# Patient Record
Sex: Female | Born: 1975 | Race: Black or African American | Hispanic: No | Marital: Married | Smoking: Current every day smoker
Health system: Southern US, Community
[De-identification: ages and names within clinical notes are randomized; demographics above are authoritative.]

## PROBLEM LIST (undated history)

## (undated) ENCOUNTER — Emergency Department (HOSPITAL_BASED_OUTPATIENT_CLINIC_OR_DEPARTMENT_OTHER): Admission: EM | Payer: Medicaid Other | Source: Home / Self Care

## (undated) DIAGNOSIS — M79661 Pain in right lower leg: Secondary | ICD-10-CM

## (undated) DIAGNOSIS — F32A Depression, unspecified: Secondary | ICD-10-CM

## (undated) DIAGNOSIS — B029 Zoster without complications: Secondary | ICD-10-CM

## (undated) DIAGNOSIS — G4730 Sleep apnea, unspecified: Secondary | ICD-10-CM

## (undated) DIAGNOSIS — D72819 Decreased white blood cell count, unspecified: Secondary | ICD-10-CM

## (undated) DIAGNOSIS — D689 Coagulation defect, unspecified: Secondary | ICD-10-CM

## (undated) DIAGNOSIS — Z98891 History of uterine scar from previous surgery: Secondary | ICD-10-CM

## (undated) DIAGNOSIS — R911 Solitary pulmonary nodule: Secondary | ICD-10-CM

## (undated) DIAGNOSIS — F209 Schizophrenia, unspecified: Secondary | ICD-10-CM

## (undated) DIAGNOSIS — Z9989 Dependence on other enabling machines and devices: Secondary | ICD-10-CM

## (undated) DIAGNOSIS — Z0000 Encounter for general adult medical examination without abnormal findings: Secondary | ICD-10-CM

## (undated) DIAGNOSIS — R1900 Intra-abdominal and pelvic swelling, mass and lump, unspecified site: Secondary | ICD-10-CM

## (undated) DIAGNOSIS — J029 Acute pharyngitis, unspecified: Secondary | ICD-10-CM

## (undated) DIAGNOSIS — D649 Anemia, unspecified: Secondary | ICD-10-CM

## (undated) DIAGNOSIS — I82629 Acute embolism and thrombosis of deep veins of unspecified upper extremity: Secondary | ICD-10-CM

## (undated) DIAGNOSIS — I10 Essential (primary) hypertension: Secondary | ICD-10-CM

## (undated) DIAGNOSIS — F419 Anxiety disorder, unspecified: Secondary | ICD-10-CM

## (undated) DIAGNOSIS — G4733 Obstructive sleep apnea (adult) (pediatric): Secondary | ICD-10-CM

## (undated) DIAGNOSIS — I2699 Other pulmonary embolism without acute cor pulmonale: Secondary | ICD-10-CM

## (undated) DIAGNOSIS — S22000A Wedge compression fracture of unspecified thoracic vertebra, initial encounter for closed fracture: Secondary | ICD-10-CM

## (undated) DIAGNOSIS — F329 Major depressive disorder, single episode, unspecified: Secondary | ICD-10-CM

## (undated) DIAGNOSIS — J342 Deviated nasal septum: Secondary | ICD-10-CM

## (undated) DIAGNOSIS — R079 Chest pain, unspecified: Secondary | ICD-10-CM

## (undated) DIAGNOSIS — R0789 Other chest pain: Secondary | ICD-10-CM

## (undated) HISTORY — DX: Coagulation defect, unspecified: D68.9

## (undated) HISTORY — DX: Solitary pulmonary nodule: R91.1

## (undated) HISTORY — DX: Obstructive sleep apnea (adult) (pediatric): G47.33

## (undated) HISTORY — DX: Major depressive disorder, single episode, unspecified: F32.9

## (undated) HISTORY — DX: Acute embolism and thrombosis of deep veins of unspecified upper extremity: I82.629

## (undated) HISTORY — DX: Depression, unspecified: F32.A

## (undated) HISTORY — DX: Sleep apnea, unspecified: G47.30

## (undated) HISTORY — DX: Other pulmonary embolism without acute cor pulmonale: I26.99

## (undated) HISTORY — DX: Decreased white blood cell count, unspecified: D72.819

## (undated) HISTORY — DX: Anxiety disorder, unspecified: F41.9

## (undated) HISTORY — PX: TUBAL LIGATION: SHX77

## (undated) HISTORY — DX: Schizophrenia, unspecified: F20.9

## (undated) HISTORY — DX: Obstructive sleep apnea (adult) (pediatric): Z99.89

## (undated) HISTORY — DX: Anemia, unspecified: D64.9

---
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
## 1998-01-14 ENCOUNTER — Emergency Department (HOSPITAL_COMMUNITY): Admission: EM | Admit: 1998-01-14 | Discharge: 1998-01-14 | Payer: Self-pay | Admitting: Emergency Medicine

## 1998-01-16 ENCOUNTER — Emergency Department (HOSPITAL_COMMUNITY): Admission: EM | Admit: 1998-01-16 | Discharge: 1998-01-16 | Payer: Self-pay | Admitting: Emergency Medicine

## 1998-02-07 ENCOUNTER — Emergency Department (HOSPITAL_COMMUNITY): Admission: EM | Admit: 1998-02-07 | Discharge: 1998-02-07 | Payer: Self-pay | Admitting: Emergency Medicine

## 1998-03-09 ENCOUNTER — Emergency Department (HOSPITAL_COMMUNITY): Admission: EM | Admit: 1998-03-09 | Discharge: 1998-03-09 | Payer: Self-pay | Admitting: Endocrinology

## 1998-03-16 ENCOUNTER — Inpatient Hospital Stay (HOSPITAL_COMMUNITY): Admission: AD | Admit: 1998-03-16 | Discharge: 1998-03-16 | Payer: Self-pay | Admitting: Obstetrics

## 1998-04-13 ENCOUNTER — Ambulatory Visit (HOSPITAL_COMMUNITY): Admission: RE | Admit: 1998-04-13 | Discharge: 1998-04-13 | Payer: Self-pay | Admitting: Obstetrics

## 1998-11-07 ENCOUNTER — Other Ambulatory Visit: Admission: RE | Admit: 1998-11-07 | Discharge: 1998-11-07 | Payer: Self-pay | Admitting: Obstetrics

## 1999-02-15 ENCOUNTER — Emergency Department (HOSPITAL_COMMUNITY): Admission: EM | Admit: 1999-02-15 | Discharge: 1999-02-15 | Payer: Self-pay

## 1999-02-15 ENCOUNTER — Encounter: Payer: Self-pay | Admitting: Emergency Medicine

## 1999-04-20 ENCOUNTER — Emergency Department (HOSPITAL_COMMUNITY): Admission: EM | Admit: 1999-04-20 | Discharge: 1999-04-20 | Payer: Self-pay | Admitting: *Deleted

## 1999-06-16 ENCOUNTER — Emergency Department (HOSPITAL_COMMUNITY): Admission: EM | Admit: 1999-06-16 | Discharge: 1999-06-16 | Payer: Self-pay | Admitting: Emergency Medicine

## 1999-06-26 ENCOUNTER — Encounter: Payer: Self-pay | Admitting: Emergency Medicine

## 1999-06-26 ENCOUNTER — Emergency Department (HOSPITAL_COMMUNITY): Admission: EM | Admit: 1999-06-26 | Discharge: 1999-06-26 | Payer: Self-pay | Admitting: Emergency Medicine

## 1999-08-10 ENCOUNTER — Encounter: Payer: Self-pay | Admitting: Emergency Medicine

## 1999-08-10 ENCOUNTER — Emergency Department (HOSPITAL_COMMUNITY): Admission: EM | Admit: 1999-08-10 | Discharge: 1999-08-10 | Payer: Self-pay | Admitting: Emergency Medicine

## 1999-10-24 ENCOUNTER — Other Ambulatory Visit: Admission: RE | Admit: 1999-10-24 | Discharge: 1999-10-24 | Payer: Self-pay | Admitting: Obstetrics

## 2000-01-20 ENCOUNTER — Emergency Department (HOSPITAL_COMMUNITY): Admission: EM | Admit: 2000-01-20 | Discharge: 2000-01-20 | Payer: Self-pay | Admitting: *Deleted

## 2000-02-12 ENCOUNTER — Other Ambulatory Visit: Admission: RE | Admit: 2000-02-12 | Discharge: 2000-02-12 | Payer: Self-pay | Admitting: Obstetrics

## 2000-02-27 ENCOUNTER — Emergency Department (HOSPITAL_COMMUNITY): Admission: EM | Admit: 2000-02-27 | Discharge: 2000-02-27 | Payer: Self-pay | Admitting: Emergency Medicine

## 2000-02-27 ENCOUNTER — Encounter: Payer: Self-pay | Admitting: Emergency Medicine

## 2000-02-27 ENCOUNTER — Emergency Department (HOSPITAL_COMMUNITY): Admission: EM | Admit: 2000-02-27 | Discharge: 2000-02-27 | Payer: Self-pay | Admitting: *Deleted

## 2000-03-13 ENCOUNTER — Encounter: Admission: RE | Admit: 2000-03-13 | Discharge: 2000-03-13 | Payer: Self-pay | Admitting: General Surgery

## 2000-03-13 ENCOUNTER — Encounter (HOSPITAL_BASED_OUTPATIENT_CLINIC_OR_DEPARTMENT_OTHER): Payer: Self-pay | Admitting: General Surgery

## 2000-03-14 ENCOUNTER — Ambulatory Visit (HOSPITAL_BASED_OUTPATIENT_CLINIC_OR_DEPARTMENT_OTHER): Admission: RE | Admit: 2000-03-14 | Discharge: 2000-03-14 | Payer: Self-pay | Admitting: General Surgery

## 2000-07-03 ENCOUNTER — Inpatient Hospital Stay (HOSPITAL_COMMUNITY): Admission: AD | Admit: 2000-07-03 | Discharge: 2000-07-03 | Payer: Self-pay | Admitting: Obstetrics

## 2000-07-03 ENCOUNTER — Encounter: Payer: Self-pay | Admitting: Obstetrics

## 2001-09-20 ENCOUNTER — Emergency Department (HOSPITAL_COMMUNITY): Admission: EM | Admit: 2001-09-20 | Discharge: 2001-09-20 | Payer: Self-pay | Admitting: Emergency Medicine

## 2001-10-08 ENCOUNTER — Encounter: Payer: Self-pay | Admitting: Obstetrics & Gynecology

## 2001-10-08 ENCOUNTER — Inpatient Hospital Stay (HOSPITAL_COMMUNITY): Admission: AD | Admit: 2001-10-08 | Discharge: 2001-10-08 | Payer: Self-pay | Admitting: Obstetrics & Gynecology

## 2001-11-05 ENCOUNTER — Encounter: Payer: Self-pay | Admitting: Emergency Medicine

## 2001-11-05 ENCOUNTER — Emergency Department (HOSPITAL_COMMUNITY): Admission: EM | Admit: 2001-11-05 | Discharge: 2001-11-05 | Payer: Self-pay | Admitting: Emergency Medicine

## 2002-02-02 ENCOUNTER — Emergency Department (HOSPITAL_COMMUNITY): Admission: EM | Admit: 2002-02-02 | Discharge: 2002-02-02 | Payer: Self-pay | Admitting: Emergency Medicine

## 2002-04-06 ENCOUNTER — Emergency Department (HOSPITAL_COMMUNITY): Admission: EM | Admit: 2002-04-06 | Discharge: 2002-04-06 | Payer: Self-pay | Admitting: Emergency Medicine

## 2002-04-24 ENCOUNTER — Emergency Department (HOSPITAL_COMMUNITY): Admission: EM | Admit: 2002-04-24 | Discharge: 2002-04-24 | Payer: Self-pay | Admitting: Emergency Medicine

## 2002-10-29 ENCOUNTER — Inpatient Hospital Stay (HOSPITAL_COMMUNITY): Admission: AD | Admit: 2002-10-29 | Discharge: 2002-10-29 | Payer: Self-pay | Admitting: Obstetrics

## 2002-11-17 ENCOUNTER — Encounter: Payer: Self-pay | Admitting: Emergency Medicine

## 2002-11-17 ENCOUNTER — Emergency Department (HOSPITAL_COMMUNITY): Admission: EM | Admit: 2002-11-17 | Discharge: 2002-11-17 | Payer: Self-pay | Admitting: Emergency Medicine

## 2003-01-25 ENCOUNTER — Emergency Department (HOSPITAL_COMMUNITY): Admission: EM | Admit: 2003-01-25 | Discharge: 2003-01-25 | Payer: Self-pay | Admitting: Emergency Medicine

## 2003-06-22 ENCOUNTER — Encounter: Payer: Self-pay | Admitting: Emergency Medicine

## 2003-06-22 ENCOUNTER — Emergency Department (HOSPITAL_COMMUNITY): Admission: EM | Admit: 2003-06-22 | Discharge: 2003-06-22 | Payer: Self-pay

## 2003-08-12 ENCOUNTER — Emergency Department (HOSPITAL_COMMUNITY): Admission: EM | Admit: 2003-08-12 | Discharge: 2003-08-12 | Payer: Self-pay | Admitting: Emergency Medicine

## 2003-08-18 ENCOUNTER — Emergency Department (HOSPITAL_COMMUNITY): Admission: EM | Admit: 2003-08-18 | Discharge: 2003-08-19 | Payer: Self-pay | Admitting: *Deleted

## 2003-09-19 ENCOUNTER — Emergency Department (HOSPITAL_COMMUNITY): Admission: EM | Admit: 2003-09-19 | Discharge: 2003-09-19 | Payer: Self-pay | Admitting: Emergency Medicine

## 2004-01-04 ENCOUNTER — Emergency Department (HOSPITAL_COMMUNITY): Admission: EM | Admit: 2004-01-04 | Discharge: 2004-01-04 | Payer: Self-pay | Admitting: Emergency Medicine

## 2004-01-05 ENCOUNTER — Emergency Department (HOSPITAL_COMMUNITY): Admission: EM | Admit: 2004-01-05 | Discharge: 2004-01-05 | Payer: Self-pay | Admitting: Emergency Medicine

## 2004-03-28 ENCOUNTER — Emergency Department (HOSPITAL_COMMUNITY): Admission: EM | Admit: 2004-03-28 | Discharge: 2004-03-28 | Payer: Self-pay | Admitting: Family Medicine

## 2004-11-25 ENCOUNTER — Emergency Department (HOSPITAL_COMMUNITY): Admission: EM | Admit: 2004-11-25 | Discharge: 2004-11-25 | Payer: Self-pay | Admitting: Emergency Medicine

## 2004-12-11 ENCOUNTER — Emergency Department (HOSPITAL_COMMUNITY): Admission: EM | Admit: 2004-12-11 | Discharge: 2004-12-11 | Payer: Self-pay | Admitting: Emergency Medicine

## 2004-12-12 ENCOUNTER — Emergency Department (HOSPITAL_COMMUNITY): Admission: EM | Admit: 2004-12-12 | Discharge: 2004-12-12 | Payer: Self-pay | Admitting: Emergency Medicine

## 2005-02-02 ENCOUNTER — Inpatient Hospital Stay (HOSPITAL_COMMUNITY): Admission: AD | Admit: 2005-02-02 | Discharge: 2005-02-02 | Payer: Self-pay | Admitting: Obstetrics

## 2005-02-28 HISTORY — PX: EXPLORATORY LAPAROTOMY WITH ABDOMINAL MASS EXCISION: SHX5169

## 2005-03-14 ENCOUNTER — Ambulatory Visit (HOSPITAL_BASED_OUTPATIENT_CLINIC_OR_DEPARTMENT_OTHER): Admission: RE | Admit: 2005-03-14 | Discharge: 2005-03-14 | Payer: Self-pay | Admitting: General Surgery

## 2005-03-14 ENCOUNTER — Encounter (INDEPENDENT_AMBULATORY_CARE_PROVIDER_SITE_OTHER): Payer: Self-pay | Admitting: *Deleted

## 2005-03-14 ENCOUNTER — Ambulatory Visit (HOSPITAL_COMMUNITY): Admission: RE | Admit: 2005-03-14 | Discharge: 2005-03-14 | Payer: Self-pay | Admitting: General Surgery

## 2005-08-31 ENCOUNTER — Ambulatory Visit (HOSPITAL_COMMUNITY): Admission: RE | Admit: 2005-08-31 | Discharge: 2005-08-31 | Payer: Self-pay | Admitting: Emergency Medicine

## 2005-08-31 ENCOUNTER — Emergency Department (HOSPITAL_COMMUNITY): Admission: EM | Admit: 2005-08-31 | Discharge: 2005-08-31 | Payer: Self-pay | Admitting: Emergency Medicine

## 2005-09-02 ENCOUNTER — Ambulatory Visit: Payer: Self-pay | Admitting: Internal Medicine

## 2005-09-02 ENCOUNTER — Inpatient Hospital Stay (HOSPITAL_COMMUNITY): Admission: EM | Admit: 2005-09-02 | Discharge: 2005-09-11 | Payer: Self-pay | Admitting: Family Medicine

## 2005-09-07 DIAGNOSIS — I2699 Other pulmonary embolism without acute cor pulmonale: Secondary | ICD-10-CM

## 2005-09-07 HISTORY — DX: Other pulmonary embolism without acute cor pulmonale: I26.99

## 2005-09-11 ENCOUNTER — Ambulatory Visit: Payer: Self-pay | Admitting: Hematology & Oncology

## 2005-09-16 ENCOUNTER — Ambulatory Visit: Payer: Self-pay | Admitting: Internal Medicine

## 2005-09-30 DIAGNOSIS — D649 Anemia, unspecified: Secondary | ICD-10-CM

## 2005-09-30 HISTORY — DX: Anemia, unspecified: D64.9

## 2005-10-17 ENCOUNTER — Ambulatory Visit: Payer: Self-pay | Admitting: Internal Medicine

## 2005-10-21 ENCOUNTER — Ambulatory Visit: Payer: Self-pay | Admitting: Hospitalist

## 2005-11-04 ENCOUNTER — Ambulatory Visit: Payer: Self-pay | Admitting: Internal Medicine

## 2005-11-08 ENCOUNTER — Ambulatory Visit: Payer: Self-pay | Admitting: Internal Medicine

## 2005-11-28 ENCOUNTER — Ambulatory Visit: Payer: Self-pay | Admitting: Internal Medicine

## 2005-12-07 ENCOUNTER — Emergency Department (HOSPITAL_COMMUNITY): Admission: EM | Admit: 2005-12-07 | Discharge: 2005-12-07 | Payer: Self-pay | Admitting: Emergency Medicine

## 2005-12-16 ENCOUNTER — Ambulatory Visit: Payer: Self-pay | Admitting: Hospitalist

## 2006-01-10 ENCOUNTER — Emergency Department (HOSPITAL_COMMUNITY): Admission: EM | Admit: 2006-01-10 | Discharge: 2006-01-10 | Payer: Self-pay | Admitting: Emergency Medicine

## 2006-01-10 ENCOUNTER — Encounter: Payer: Self-pay | Admitting: Vascular Surgery

## 2006-04-14 ENCOUNTER — Ambulatory Visit: Payer: Self-pay | Admitting: Internal Medicine

## 2006-05-26 ENCOUNTER — Ambulatory Visit: Payer: Self-pay | Admitting: Internal Medicine

## 2006-05-29 ENCOUNTER — Encounter (INDEPENDENT_AMBULATORY_CARE_PROVIDER_SITE_OTHER): Payer: Self-pay | Admitting: *Deleted

## 2006-05-29 ENCOUNTER — Ambulatory Visit: Payer: Self-pay | Admitting: Internal Medicine

## 2006-05-29 ENCOUNTER — Ambulatory Visit (HOSPITAL_COMMUNITY): Admission: RE | Admit: 2006-05-29 | Discharge: 2006-05-29 | Payer: Self-pay | Admitting: Internal Medicine

## 2006-05-30 ENCOUNTER — Ambulatory Visit (HOSPITAL_COMMUNITY): Admission: RE | Admit: 2006-05-30 | Discharge: 2006-05-30 | Payer: Self-pay | Admitting: Internal Medicine

## 2006-07-21 DIAGNOSIS — Z86718 Personal history of other venous thrombosis and embolism: Secondary | ICD-10-CM | POA: Insufficient documentation

## 2006-08-20 ENCOUNTER — Ambulatory Visit: Payer: Self-pay | Admitting: Internal Medicine

## 2006-08-20 ENCOUNTER — Encounter (INDEPENDENT_AMBULATORY_CARE_PROVIDER_SITE_OTHER): Payer: Self-pay | Admitting: *Deleted

## 2006-08-20 LAB — CONVERTED CEMR LAB
Basophils Absolute: 0 10*3/uL (ref 0.0–0.1)
Bilirubin Urine: NEGATIVE
EBV VCA IgG: 5.95 — ABNORMAL HIGH
Lymphs Abs: 1.8 10*3/uL (ref 0.8–3.1)
MCHC: 32.4 g/dL — ABNORMAL LOW (ref 33.1–35.4)
Monocytes Relative: 7 % (ref 3–11)
Neutro Abs: 2 10*3/uL (ref 1.8–6.8)
Neutrophils Relative %: 48 % (ref 47–77)
Platelets: 237 10*3/uL (ref 152–374)
Protein, ur: 30 mg/dL — AB
Prothrombin Time: 13.9 s (ref 11.6–15.2)
RBC: 4.56 M/uL (ref 3.79–4.96)
Specific Gravity, Urine: 1.017 (ref 1.005–1.03)
Urobilinogen, UA: 0.2 (ref 0.0–1.0)
WBC: 4.1 10*3/uL (ref 3.7–10.0)

## 2006-08-21 ENCOUNTER — Encounter (INDEPENDENT_AMBULATORY_CARE_PROVIDER_SITE_OTHER): Payer: Self-pay | Admitting: *Deleted

## 2006-08-25 ENCOUNTER — Ambulatory Visit: Payer: Self-pay | Admitting: Internal Medicine

## 2006-09-15 ENCOUNTER — Ambulatory Visit: Payer: Self-pay | Admitting: Internal Medicine

## 2006-09-15 ENCOUNTER — Encounter (INDEPENDENT_AMBULATORY_CARE_PROVIDER_SITE_OTHER): Payer: Self-pay | Admitting: *Deleted

## 2006-09-15 LAB — CONVERTED CEMR LAB
Bilirubin Urine: NEGATIVE
Leukocytes, UA: NEGATIVE
Protein, ur: NEGATIVE mg/dL
Specific Gravity, Urine: 1.03 (ref 1.005–1.03)
Urine Glucose: NEGATIVE mg/dL
WBC, UA: NONE SEEN cells/hpf (ref <3)
pH: 5.5 (ref 5.0–8.0)

## 2006-09-30 DIAGNOSIS — D72819 Decreased white blood cell count, unspecified: Secondary | ICD-10-CM

## 2006-09-30 HISTORY — DX: Decreased white blood cell count, unspecified: D72.819

## 2006-10-12 DIAGNOSIS — D72819 Decreased white blood cell count, unspecified: Secondary | ICD-10-CM | POA: Insufficient documentation

## 2006-10-12 DIAGNOSIS — J45909 Unspecified asthma, uncomplicated: Secondary | ICD-10-CM | POA: Insufficient documentation

## 2006-10-12 DIAGNOSIS — D509 Iron deficiency anemia, unspecified: Secondary | ICD-10-CM

## 2006-10-12 DIAGNOSIS — D6859 Other primary thrombophilia: Secondary | ICD-10-CM | POA: Insufficient documentation

## 2007-03-09 ENCOUNTER — Encounter (INDEPENDENT_AMBULATORY_CARE_PROVIDER_SITE_OTHER): Payer: Self-pay | Admitting: *Deleted

## 2007-03-09 ENCOUNTER — Ambulatory Visit: Payer: Self-pay | Admitting: Internal Medicine

## 2007-03-10 LAB — CONVERTED CEMR LAB
AST: 10 units/L (ref 0–37)
Alkaline Phosphatase: 31 units/L — ABNORMAL LOW (ref 39–117)
Basophils Relative: 0 % (ref 0–1)
Eosinophils Absolute: 0.1 10*3/uL (ref 0.0–0.7)
Eosinophils Relative: 2 % (ref 0–5)
Glucose, Bld: 74 mg/dL (ref 70–99)
HCT: 32.3 % — ABNORMAL LOW (ref 36.0–46.0)
Lymphs Abs: 1.8 10*3/uL (ref 0.7–3.3)
MCHC: 31.3 g/dL (ref 30.0–36.0)
MCV: 75.1 fL — ABNORMAL LOW (ref 78.0–100.0)
Neutrophils Relative %: 30 % — ABNORMAL LOW (ref 43–77)
Platelets: 224 10*3/uL (ref 150–400)
Potassium: 4.1 meq/L (ref 3.5–5.3)
RDW: 15.7 % — ABNORMAL HIGH (ref 11.5–14.0)
Sodium: 139 meq/L (ref 135–145)
Total Bilirubin: 0.3 mg/dL (ref 0.3–1.2)
Total Protein: 6.4 g/dL (ref 6.0–8.3)
WBC: 3.3 10*3/uL — ABNORMAL LOW (ref 4.0–10.5)

## 2007-04-30 ENCOUNTER — Emergency Department (HOSPITAL_COMMUNITY): Admission: EM | Admit: 2007-04-30 | Discharge: 2007-04-30 | Payer: Self-pay | Admitting: Emergency Medicine

## 2007-05-29 ENCOUNTER — Encounter (INDEPENDENT_AMBULATORY_CARE_PROVIDER_SITE_OTHER): Payer: Self-pay | Admitting: Internal Medicine

## 2007-08-14 ENCOUNTER — Emergency Department (HOSPITAL_COMMUNITY): Admission: EM | Admit: 2007-08-14 | Discharge: 2007-08-14 | Payer: Self-pay | Admitting: Emergency Medicine

## 2007-10-07 ENCOUNTER — Emergency Department (HOSPITAL_COMMUNITY): Admission: EM | Admit: 2007-10-07 | Discharge: 2007-10-07 | Payer: Self-pay | Admitting: Emergency Medicine

## 2007-10-26 ENCOUNTER — Emergency Department (HOSPITAL_COMMUNITY): Admission: EM | Admit: 2007-10-26 | Discharge: 2007-10-26 | Payer: Self-pay | Admitting: Emergency Medicine

## 2007-11-08 ENCOUNTER — Emergency Department (HOSPITAL_COMMUNITY): Admission: EM | Admit: 2007-11-08 | Discharge: 2007-11-08 | Payer: Self-pay | Admitting: Emergency Medicine

## 2008-02-23 ENCOUNTER — Emergency Department (HOSPITAL_COMMUNITY): Admission: EM | Admit: 2008-02-23 | Discharge: 2008-02-23 | Payer: Self-pay | Admitting: Emergency Medicine

## 2008-03-26 ENCOUNTER — Emergency Department (HOSPITAL_COMMUNITY): Admission: EM | Admit: 2008-03-26 | Discharge: 2008-03-26 | Payer: Self-pay | Admitting: Family Medicine

## 2008-06-10 ENCOUNTER — Emergency Department (HOSPITAL_COMMUNITY): Admission: EM | Admit: 2008-06-10 | Discharge: 2008-06-10 | Payer: Self-pay | Admitting: Family Medicine

## 2008-06-10 DIAGNOSIS — R911 Solitary pulmonary nodule: Secondary | ICD-10-CM

## 2008-06-10 HISTORY — DX: Solitary pulmonary nodule: R91.1

## 2008-06-26 ENCOUNTER — Emergency Department (HOSPITAL_COMMUNITY): Admission: EM | Admit: 2008-06-26 | Discharge: 2008-06-26 | Payer: Self-pay | Admitting: Family Medicine

## 2008-09-20 ENCOUNTER — Emergency Department (HOSPITAL_COMMUNITY): Admission: EM | Admit: 2008-09-20 | Discharge: 2008-09-20 | Payer: Self-pay | Admitting: Emergency Medicine

## 2008-09-27 ENCOUNTER — Emergency Department (HOSPITAL_COMMUNITY): Admission: EM | Admit: 2008-09-27 | Discharge: 2008-09-27 | Payer: Self-pay | Admitting: Family Medicine

## 2008-12-05 ENCOUNTER — Emergency Department (HOSPITAL_COMMUNITY): Admission: EM | Admit: 2008-12-05 | Discharge: 2008-12-05 | Payer: Self-pay | Admitting: Emergency Medicine

## 2009-03-24 ENCOUNTER — Emergency Department (HOSPITAL_COMMUNITY): Admission: EM | Admit: 2009-03-24 | Discharge: 2009-03-24 | Payer: Self-pay | Admitting: Emergency Medicine

## 2009-05-09 ENCOUNTER — Emergency Department (HOSPITAL_COMMUNITY): Admission: EM | Admit: 2009-05-09 | Discharge: 2009-05-09 | Payer: Self-pay | Admitting: Emergency Medicine

## 2009-07-04 ENCOUNTER — Ambulatory Visit (HOSPITAL_COMMUNITY): Admission: RE | Admit: 2009-07-04 | Discharge: 2009-07-04 | Payer: Self-pay | Admitting: Internal Medicine

## 2009-07-04 ENCOUNTER — Encounter (INDEPENDENT_AMBULATORY_CARE_PROVIDER_SITE_OTHER): Payer: Self-pay | Admitting: Internal Medicine

## 2009-07-04 ENCOUNTER — Ambulatory Visit: Payer: Self-pay | Admitting: Internal Medicine

## 2009-07-04 DIAGNOSIS — R109 Unspecified abdominal pain: Secondary | ICD-10-CM

## 2009-07-04 LAB — CONVERTED CEMR LAB
Anti Nuclear Antibody(ANA): NEGATIVE
Basophils Absolute: 0 10*3/uL (ref 0.0–0.1)
Basophils Relative: 0 % (ref 0–1)
Eosinophils Absolute: 0 10*3/uL (ref 0.0–0.7)
HDL: 58 mg/dL (ref >39)
Hemoglobin: 11.9 g/dL — ABNORMAL LOW (ref 12.0–15.0)
LDL Cholesterol: 115 mg/dL — ABNORMAL HIGH (ref 0–99)
MCHC: 31.2 g/dL (ref 30.0–36.0)
MCV: 77.2 fL — ABNORMAL LOW (ref >=78.0)
Monocytes Absolute: 0.3 10*3/uL (ref 0.1–1.0)
Monocytes Relative: 12 % (ref 3–12)
RBC: 4.95 M/uL (ref 3.87–5.11)
RDW: 16.8 % — ABNORMAL HIGH (ref 11.5–15.5)
Rhuematoid fact SerPl-aCnc: <20 intl units/mL (ref 0–20)
Total CHOL/HDL Ratio: 3.1
VLDL: 8 mg/dL (ref 0–40)

## 2009-09-23 ENCOUNTER — Inpatient Hospital Stay (HOSPITAL_COMMUNITY): Admission: EM | Admit: 2009-09-23 | Discharge: 2009-09-27 | Payer: Self-pay | Admitting: Emergency Medicine

## 2009-09-23 ENCOUNTER — Encounter (INDEPENDENT_AMBULATORY_CARE_PROVIDER_SITE_OTHER): Payer: Self-pay | Admitting: Emergency Medicine

## 2009-09-23 ENCOUNTER — Encounter (INDEPENDENT_AMBULATORY_CARE_PROVIDER_SITE_OTHER): Payer: Self-pay | Admitting: Internal Medicine

## 2009-09-23 ENCOUNTER — Ambulatory Visit: Payer: Self-pay | Admitting: Vascular Surgery

## 2009-09-23 ENCOUNTER — Ambulatory Visit: Payer: Self-pay | Admitting: Internal Medicine

## 2009-09-27 ENCOUNTER — Encounter: Payer: Self-pay | Admitting: Internal Medicine

## 2009-10-04 ENCOUNTER — Emergency Department (HOSPITAL_COMMUNITY): Admission: EM | Admit: 2009-10-04 | Discharge: 2009-10-04 | Payer: Self-pay | Admitting: Emergency Medicine

## 2009-10-04 ENCOUNTER — Telehealth (INDEPENDENT_AMBULATORY_CARE_PROVIDER_SITE_OTHER): Payer: Self-pay | Admitting: Internal Medicine

## 2009-10-05 ENCOUNTER — Ambulatory Visit: Payer: Self-pay | Admitting: Internal Medicine

## 2009-10-16 ENCOUNTER — Ambulatory Visit: Payer: Self-pay | Admitting: Internal Medicine

## 2009-10-20 ENCOUNTER — Ambulatory Visit: Payer: Self-pay | Admitting: Internal Medicine

## 2009-10-23 ENCOUNTER — Ambulatory Visit: Payer: Self-pay | Admitting: Internal Medicine

## 2009-10-23 ENCOUNTER — Telehealth: Payer: Self-pay | Admitting: Internal Medicine

## 2009-10-23 LAB — CONVERTED CEMR LAB
Albumin: 3.9 g/dL (ref 3.5–5.2)
Alkaline Phosphatase: 32 units/L — ABNORMAL LOW (ref 39–117)
BUN: 17 mg/dL (ref 6–23)
Creatinine, Ser: 0.81 mg/dL (ref 0.40–1.20)
Glucose, Bld: 84 mg/dL (ref 70–99)
HCT: 33.5 % — ABNORMAL LOW (ref 36.0–46.0)
Hemoglobin: 10.7 g/dL — ABNORMAL LOW (ref 12.0–15.0)
INR: 1.7
MCHC: 31.9 g/dL (ref 30.0–36.0)
MCV: 74 fL — ABNORMAL LOW (ref >=78.0)
Potassium: 4 meq/L (ref 3.5–5.3)
RDW: 15.7 % — ABNORMAL HIGH (ref 11.5–15.5)
Total Bilirubin: 0.4 mg/dL (ref 0.3–1.2)

## 2009-11-13 ENCOUNTER — Ambulatory Visit: Payer: Self-pay | Admitting: Internal Medicine

## 2009-11-13 LAB — CONVERTED CEMR LAB

## 2009-12-04 ENCOUNTER — Ambulatory Visit: Payer: Self-pay | Admitting: Infectious Diseases

## 2009-12-04 LAB — CONVERTED CEMR LAB

## 2009-12-13 ENCOUNTER — Telehealth (INDEPENDENT_AMBULATORY_CARE_PROVIDER_SITE_OTHER): Payer: Self-pay | Admitting: *Deleted

## 2009-12-20 ENCOUNTER — Encounter (INDEPENDENT_AMBULATORY_CARE_PROVIDER_SITE_OTHER): Payer: Self-pay | Admitting: Internal Medicine

## 2010-01-01 ENCOUNTER — Ambulatory Visit: Payer: Self-pay | Admitting: Internal Medicine

## 2010-01-05 ENCOUNTER — Encounter: Payer: Self-pay | Admitting: Internal Medicine

## 2010-01-05 ENCOUNTER — Ambulatory Visit: Payer: Self-pay | Admitting: Vascular Surgery

## 2010-01-05 ENCOUNTER — Emergency Department (HOSPITAL_COMMUNITY): Admission: EM | Admit: 2010-01-05 | Discharge: 2010-01-05 | Payer: Self-pay | Admitting: Emergency Medicine

## 2010-01-07 ENCOUNTER — Emergency Department (HOSPITAL_COMMUNITY): Admission: EM | Admit: 2010-01-07 | Discharge: 2010-01-07 | Payer: Self-pay | Admitting: Emergency Medicine

## 2010-01-15 ENCOUNTER — Ambulatory Visit: Payer: Self-pay | Admitting: Internal Medicine

## 2010-01-15 LAB — CONVERTED CEMR LAB

## 2010-02-08 ENCOUNTER — Encounter (INDEPENDENT_AMBULATORY_CARE_PROVIDER_SITE_OTHER): Payer: Self-pay | Admitting: Internal Medicine

## 2010-02-08 ENCOUNTER — Telehealth (INDEPENDENT_AMBULATORY_CARE_PROVIDER_SITE_OTHER): Payer: Self-pay | Admitting: Internal Medicine

## 2010-02-28 ENCOUNTER — Ambulatory Visit: Payer: Self-pay | Admitting: Internal Medicine

## 2010-02-28 ENCOUNTER — Other Ambulatory Visit: Admission: RE | Admit: 2010-02-28 | Discharge: 2010-02-28 | Payer: Self-pay | Admitting: Internal Medicine

## 2010-02-28 LAB — HM PAP SMEAR: HM Pap smear: NEGATIVE

## 2010-03-01 LAB — CONVERTED CEMR LAB
Basophils Absolute: 0 10*3/uL (ref 0.0–0.1)
Chlamydia, DNA Probe: NEGATIVE
GC Probe Amp, Genital: NEGATIVE
Hemoglobin: 10.4 g/dL — ABNORMAL LOW (ref 12.0–15.0)
Lymphocytes Relative: 51 % — ABNORMAL HIGH (ref 12–46)
Neutro Abs: 1.4 10*3/uL — ABNORMAL LOW (ref 1.7–7.7)
Platelets: 207 10*3/uL (ref 150–400)
RDW: 19 % — ABNORMAL HIGH (ref 11.5–15.5)

## 2010-03-05 ENCOUNTER — Ambulatory Visit: Payer: Self-pay | Admitting: Internal Medicine

## 2010-03-05 LAB — CONVERTED CEMR LAB

## 2010-04-06 ENCOUNTER — Telehealth: Payer: Self-pay | Admitting: Internal Medicine

## 2010-04-16 ENCOUNTER — Ambulatory Visit: Payer: Self-pay | Admitting: Internal Medicine

## 2010-04-23 ENCOUNTER — Telehealth: Payer: Self-pay | Admitting: *Deleted

## 2010-04-23 ENCOUNTER — Ambulatory Visit: Payer: Self-pay | Admitting: Internal Medicine

## 2010-05-14 ENCOUNTER — Ambulatory Visit: Payer: Self-pay | Admitting: Internal Medicine

## 2010-05-14 LAB — CONVERTED CEMR LAB: INR: 3.6

## 2010-05-16 ENCOUNTER — Ambulatory Visit: Payer: Self-pay | Admitting: Internal Medicine

## 2010-05-16 ENCOUNTER — Encounter: Payer: Self-pay | Admitting: Internal Medicine

## 2010-05-16 DIAGNOSIS — Z8659 Personal history of other mental and behavioral disorders: Secondary | ICD-10-CM

## 2010-05-16 DIAGNOSIS — K5900 Constipation, unspecified: Secondary | ICD-10-CM | POA: Insufficient documentation

## 2010-05-16 DIAGNOSIS — K589 Irritable bowel syndrome without diarrhea: Secondary | ICD-10-CM | POA: Insufficient documentation

## 2010-05-16 DIAGNOSIS — R092 Respiratory arrest: Secondary | ICD-10-CM | POA: Insufficient documentation

## 2010-05-17 ENCOUNTER — Encounter: Admission: RE | Admit: 2010-05-17 | Discharge: 2010-05-17 | Payer: Self-pay | Admitting: Internal Medicine

## 2010-05-17 LAB — HM MAMMOGRAPHY: HM Mammogram: NEGATIVE

## 2010-05-28 ENCOUNTER — Ambulatory Visit: Payer: Self-pay | Admitting: Internal Medicine

## 2010-05-28 LAB — CONVERTED CEMR LAB

## 2010-06-11 ENCOUNTER — Ambulatory Visit: Payer: Self-pay | Admitting: Internal Medicine

## 2010-06-11 LAB — CONVERTED CEMR LAB: INR: 3.2

## 2010-07-04 ENCOUNTER — Telehealth: Payer: Self-pay | Admitting: *Deleted

## 2010-07-13 ENCOUNTER — Emergency Department (HOSPITAL_COMMUNITY): Admission: EM | Admit: 2010-07-13 | Discharge: 2010-07-13 | Payer: Self-pay | Admitting: Emergency Medicine

## 2010-07-16 ENCOUNTER — Ambulatory Visit: Payer: Self-pay | Admitting: Internal Medicine

## 2010-08-05 ENCOUNTER — Emergency Department (HOSPITAL_COMMUNITY): Admission: EM | Admit: 2010-08-05 | Discharge: 2010-08-05 | Payer: Self-pay | Admitting: Emergency Medicine

## 2010-08-20 ENCOUNTER — Ambulatory Visit: Payer: Self-pay | Admitting: Internal Medicine

## 2010-08-30 ENCOUNTER — Ambulatory Visit: Payer: Self-pay | Admitting: Internal Medicine

## 2010-08-30 DIAGNOSIS — R51 Headache: Secondary | ICD-10-CM

## 2010-08-30 DIAGNOSIS — R519 Headache, unspecified: Secondary | ICD-10-CM | POA: Insufficient documentation

## 2010-09-14 ENCOUNTER — Ambulatory Visit: Payer: Self-pay

## 2010-09-17 ENCOUNTER — Ambulatory Visit: Payer: Self-pay

## 2010-09-26 ENCOUNTER — Telehealth: Payer: Self-pay | Admitting: *Deleted

## 2010-09-26 ENCOUNTER — Emergency Department (HOSPITAL_COMMUNITY)
Admission: EM | Admit: 2010-09-26 | Discharge: 2010-09-26 | Payer: Self-pay | Source: Home / Self Care | Admitting: Family Medicine

## 2010-10-01 ENCOUNTER — Telehealth: Payer: Self-pay | Admitting: Internal Medicine

## 2010-10-01 ENCOUNTER — Ambulatory Visit: Admit: 2010-10-01 | Payer: Self-pay

## 2010-10-01 ENCOUNTER — Ambulatory Visit
Admission: RE | Admit: 2010-10-01 | Discharge: 2010-10-01 | Payer: Self-pay | Source: Home / Self Care | Attending: Internal Medicine | Admitting: Internal Medicine

## 2010-10-05 ENCOUNTER — Ambulatory Visit: Admission: RE | Admit: 2010-10-05 | Discharge: 2010-10-05 | Payer: Self-pay | Source: Home / Self Care

## 2010-10-05 LAB — CONVERTED CEMR LAB: HIV: NONREACTIVE

## 2010-10-17 ENCOUNTER — Telehealth: Payer: Self-pay | Admitting: *Deleted

## 2010-10-20 DIAGNOSIS — Z7901 Long term (current) use of anticoagulants: Secondary | ICD-10-CM | POA: Insufficient documentation

## 2010-10-20 DIAGNOSIS — Z86718 Personal history of other venous thrombosis and embolism: Secondary | ICD-10-CM

## 2010-10-20 DIAGNOSIS — D6859 Other primary thrombophilia: Secondary | ICD-10-CM

## 2010-10-21 ENCOUNTER — Encounter: Payer: Self-pay | Admitting: Internal Medicine

## 2010-10-22 ENCOUNTER — Ambulatory Visit: Admission: RE | Admit: 2010-10-22 | Discharge: 2010-10-22 | Payer: Self-pay | Source: Home / Self Care

## 2010-10-22 LAB — CONVERTED CEMR LAB

## 2010-10-29 ENCOUNTER — Telehealth: Payer: Self-pay | Admitting: *Deleted

## 2010-10-30 NOTE — Assessment & Plan Note (Signed)
Summary: COU/CH  Anticoagulant Therapy Managed by: Barbera Setters. Janie Morning  PharmD CACP PCP: [name] [facility] Attending: Margarito Liner MD Indication 1: Deep vein thrombus Indication 2: Pulmonary  embolus Start date: 09/23/2009 Duration: Indefinite  Patient Assessment Reviewed by: Chancy Milroy PharmD  June 11, 2010 Medication review: verified warfarin dosage & schedule,verified previous prescription medications, verified doses & any changes, verified new medications, reviewed OTC medications, reviewed OTC health products-vitamins supplements etc Complications: none Dietary changes: none   Health status changes: none   Lifestyle changes: none   Recent/future hospitalizations: none   Recent/future procedures: none   Recent/future dental: none Patient Assessment Part 2:  Have you MISSED ANY DOSES or CHANGED TABLETS?  No missed Warfarin doses or changed tablets.  Have you had any BRUISING or BLEEDING ( nose or gum bleeds,blood in urine or stool)?  No reported bruising or bleeding in nose, gums, urine, stool.  Have you STARTED or STOPPED any MEDICATIONS, including OTC meds,herbals or supplements?  No other medications or herbal supplements were started or stopped.  Have you CHANGED your DIET, especially green vegetables,or ALCOHOL intake?  No changes in diet or alcohol intake.  Have you had any ILLNESSES or HOSPITALIZATIONS?  No reported illnesses or hospitalizations  Have you had any signs of CLOTTING?(chest discomfort,dizziness,shortness of breath,arms tingling,slurred speech,swelling or redness in leg)    No chest discomfort, dizziness, shortness of breath, tingling in arm, slurred speech, swelling, or redness in leg.     Treatment  Target INR: 2.0-3.0 INR: 3.2  Date: 06/11/2010 Regimen In:  140mg /wk INR reflects regimen in: 3.2   Regimen Out:     Sunday: 4 Tablet     Monday: 3 Tablet     Tuesday: 4 Tablet     Wednesday: 4 Tablet     Thursday: 4 Tablet  Friday: 4 Tablet     Saturday: 4 Tablet Total Weekly: 135mg /wk mg  Next INR Due: 06/25/2010 Adjusted by: Barbera Setters. Alexandria Lodge III PharmD CACP   Return to anticoagulation clinic:  06/25/2010 Time of next visit: 0945    Allergies: No Known Drug Allergies

## 2010-10-30 NOTE — Assessment & Plan Note (Signed)
Summary: COU/'CH  Anticoagulant Therapy Managed by: Barbera Setters. Janie Morning  PharmD CACP PCP: [name] Indication 1: Deep vein thrombus Indication 2: Pulmonary  embolus Start date: 09/23/2009 Duration: Indefinite  Patient Assessment Reviewed by: Chancy Milroy PharmD  August 20, 2010 Medication review: verified warfarin dosage & schedule,verified previous prescription medications, verified doses & any changes, verified new medications, reviewed OTC medications, reviewed OTC health products-vitamins supplements etc Complications: none Dietary changes: none   Health status changes: none   Lifestyle changes: none   Recent/future hospitalizations: none   Recent/future procedures: none   Recent/future dental: none Patient Assessment Part 2:  Have you MISSED ANY DOSES or CHANGED TABLETS?  No missed Warfarin doses or changed tablets.  Have you had any BRUISING or BLEEDING ( nose or gum bleeds,blood in urine or stool)?  No reported bruising or bleeding in nose, gums, urine, stool.  Have you STARTED or STOPPED any MEDICATIONS, including OTC meds,herbals or supplements?  No other medications or herbal supplements were started or stopped.  Have you CHANGED your DIET, especially green vegetables,or ALCOHOL intake?  No changes in diet or alcohol intake.  Have you had any ILLNESSES or HOSPITALIZATIONS?  No reported illnesses or hospitalizations  Have you had any signs of CLOTTING?(chest discomfort,dizziness,shortness of breath,arms tingling,slurred speech,swelling or redness in leg)    No chest discomfort, dizziness, shortness of breath, tingling in arm, slurred speech, swelling, or redness in leg.     Treatment  Target INR: 2.0-3.0 INR: 2.4  Date: 08/20/2010 Regimen In:  135mg /wk INR reflects regimen in: 2.4  New  Tablet strength: : 5mg  Regimen Out:     Sunday: 4 Tablet     Monday: 3 Tablet     Tuesday: 4 Tablet     Wednesday: 4 Tablet     Thursday: 4 Tablet      Friday: 4  Tablet     Saturday: 4 Tablet Total Weekly: 135mg /wk mg  Next INR Due: 09/17/2010 Adjusted by: Barbera Setters. Alexandria Lodge III PharmD CACP   Return to anticoagulation clinic:  09/17/2010 Time of next visit: 0915    Allergies: No Known Drug Allergies

## 2010-10-30 NOTE — Assessment & Plan Note (Signed)
Summary: COU/CH  Anticoagulant Therapy Managed by: Barbera Setters. Janie Morning  PharmD CACP PCP: [name] [facility] Attending: Coralee Pesa MD, Levada Schilling Indication 1: Deep vein thrombus Indication 2: Pulmonary  embolus Start date: 09/23/2009 Duration: Indefinite  Patient Assessment Reviewed by: Chancy Milroy PharmD  March 05, 2010 Medication review: verified warfarin dosage & schedule,verified previous prescription medications, verified doses & any changes, verified new medications, reviewed OTC medications, reviewed OTC health products-vitamins supplements etc Complications: none Dietary changes: none   Health status changes: none   Lifestyle changes: none   Recent/future hospitalizations: none   Recent/future procedures: none   Recent/future dental: none Patient Assessment Part 2:  Have you MISSED ANY DOSES or CHANGED TABLETS?  No missed Warfarin doses or changed tablets.  Have you had any BRUISING or BLEEDING ( nose or gum bleeds,blood in urine or stool)?  No reported bruising or bleeding in nose, gums, urine, stool.  Have you STARTED or STOPPED any MEDICATIONS, including OTC meds,herbals or supplements?  No other medications or herbal supplements were started or stopped.  Have you CHANGED your DIET, especially green vegetables,or ALCOHOL intake?  No changes in diet or alcohol intake.  Have you had any ILLNESSES or HOSPITALIZATIONS?  No reported illnesses or hospitalizations  Have you had any signs of CLOTTING?(chest discomfort,dizziness,shortness of breath,arms tingling,slurred speech,swelling or redness in leg)    No chest discomfort, dizziness, shortness of breath, tingling in arm, slurred speech, swelling, or redness in leg.     Treatment  Target INR: 2.0-3.0 INR: 2.90  Date: 03/05/2010 Regimen In:  145mg /wk INR reflects regimen in: 2.90  New  Tablet strength: : 5mg  Regimen Out:     Sunday: 4 Tablet     Monday: 4 Tablet     Tuesday: 5 Tablet     Wednesday: 4 Tablet  Thursday: 4 Tablet      Friday: 4 Tablet     Saturday: 4 Tablet Total Weekly: 150mg /wk mg  Next INR Due: 03/26/2010 Adjusted by: Barbera Setters. Alexandria Lodge III PharmD CACP   Return to anticoagulation clinic:  03/26/2010 Time of next visit: 0915    Allergies: No Known Drug Allergies

## 2010-10-30 NOTE — Assessment & Plan Note (Signed)
Summary: COU  Anticoagulant Therapy Managed by: Barbera Setters. Janie Morning  PharmD CACP PCP: [name] [facility] Attending: Sampson Goon MD, Onalee Hua Indication 1: Deep vein thrombus Indication 2: Pulmonary  embolus Start date: 09/23/2009 Duration: Indefinite  Patient Assessment Reviewed by: Chancy Milroy PharmD  December 04, 2009 Medication review: verified warfarin dosage & schedule,verified previous prescription medications, verified doses & any changes, verified new medications, reviewed OTC medications, reviewed OTC health products-vitamins supplements etc Complications: none Dietary changes: none   Health status changes: none   Lifestyle changes: none   Recent/future hospitalizations: none   Recent/future procedures: none   Recent/future dental: none Patient Assessment Part 2:  Have you MISSED ANY DOSES or CHANGED TABLETS?  No missed Warfarin doses or changed tablets.  Have you had any BRUISING or BLEEDING ( nose or gum bleeds,blood in urine or stool)?  No reported bruising or bleeding in nose, gums, urine, stool.  Have you STARTED or STOPPED any MEDICATIONS, including OTC meds,herbals or supplements?  No other medications or herbal supplements were started or stopped.  Have you CHANGED your DIET, especially green vegetables,or ALCOHOL intake?  No changes in diet or alcohol intake.  Have you had any ILLNESSES or HOSPITALIZATIONS?  No reported illnesses or hospitalizations  Have you had any signs of CLOTTING?(chest discomfort,dizziness,shortness of breath,arms tingling,slurred speech,swelling or redness in leg)    No chest discomfort, dizziness, shortness of breath, tingling in arm, slurred speech, swelling, or redness in leg.     Treatment  Target INR: 2.0-3.0 INR: 1.6  Date: 12/04/2009 Regimen In:  140mg /wk INR reflects regimen in: 1.6  New  Tablet strength: : 5mg  Regimen Out:     Sunday: 4 Tablet     Monday: 5 Tablet     Tuesday: 4 Tablet     Wednesday: 5 Tablet  Thursday: 4 Tablet      Friday: 5 Tablet     Saturday: 4 Tablet Total Weekly: 155mg /wk mg  Next INR Due: 12/18/2009 Adjusted by: Barbera Setters. Alexandria Lodge III PharmD CACP   Return to anticoagulation clinic:  12/18/2009 Time of next visit: 1115    Allergies: No Known Drug Allergies

## 2010-10-30 NOTE — Assessment & Plan Note (Signed)
Summary: ACUTE-REF FOR ASTHMA/CFB   Vital Signs:  Patient profile:   35 year old female Height:      64.75 inches Weight:      201.9 pounds BMI:     33.98 O2 Sat:      100 % on Room air Temp:     98.8 degrees F oral Pulse rate:   64 / minute BP sitting:   115 / 84  (right arm)  Vitals Entered By: Filomena Jungling NT II (April 23, 2010 1:44 PM)  O2 Flow:  Room air CC: TEST RESULTS, SWELLING IN HAND,? WHY BLOOD CLOTS  Is Patient Diabetic? No Pain Assessment Patient in pain? yes     Location: lower back Intensity: 4 Type: aching Onset of pain  Chronic Nutritional Status BMI of > 30 = obese  Have you ever been in a relationship where you felt threatened, hurt or afraid?No   Does patient need assistance? Functional Status Self care Ambulation Normal   Primary Care Provider:  [name]  CC:  TEST RESULTS, SWELLING IN HAND, and ? WHY BLOOD CLOTS .  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 The patient says that she has been having increased work of breathing for 2 months, progressive ever since then and she has increased the frequency of her inhalers. She aslo wakes up at night short of breath about twice a month. She also complains of some memmory trouble which has been ongoing for one year. It has been described by the patient as difficulty remembering her phone number and sometimes name of her children.   Preventive Screening-Counseling & Management  Alcohol-Tobacco     Alcohol drinks/day: <1     Alcohol type: beer     Smoking Status: current     Packs/Day: 1/2 ppd     Year Started: 1987     Year Quit: jan 2008  Problems Prior to Update: 1)  Memory Loss  (ICD-780.93) 2)  Obesity  (ICD-278.00) 3)  Cramp of Limb  (ICD-729.82) 4)  Ear Pain, Left  (ICD-388.70) 5)  Sx of Knee Pain, Left  (ICD-719.46) 6)  Sx of Rib Pain, Left Sided   (ICD-786.50) 7)  ? of Lupus  (ICD-710.0) 8)  Screen For Condition Nos  (ICD-V82.9) 9)  Anemia-iron Deficiency  (ICD-280.9) 10)  Asthma  (ICD-493.90) 11)  Hypercoagulable State, Primary  (ICD-289.81) 12)  Leukocytopenia Nos  (ICD-288.50) 13)  Pulmonary Embolism, Hx of  (ICD-V12.51) 14)  Dvt, Hx of  (ICD-V12.51) 15)  Hx of Groin Pain  (ICD-789.09)  Medications Prior to Update: 1)  Ventolin Hfa 108 (90 Base) Mcg/act Aers (Albuterol Sulfate) .... 2 Puffs Every 6 Hours As Needed 2)  Seroquel 300 Mg Tabs (Quetiapine Fumarate) .... Take 1 Tab By Mouth At Bedtime 3)  Warfarin Sodium 5 Mg Tabs (Warfarin Sodium) .... Tablet Strength: 5mg  Take As Directed 4)  Ferrous Sulfate 325 (65 Fe) Mg Tabs (Ferrous Sulfate) .... Take 1 Tablet By Mouth Once A Day  Current Medications (verified): 1)  Ventolin Hfa 108 (90 Base) Mcg/act Aers (Albuterol Sulfate) .... 2 Puffs Every 6 Hours As Needed 2)  Seroquel 300 Mg Tabs (Quetiapine Fumarate) .... Take 1 Tab By Mouth At Bedtime 3)  Warfarin Sodium 5 Mg Tabs (Warfarin Sodium) .... Tablet Strength: 5mg  Take As Directed 4)  Ferrous Sulfate 325 (65 Fe) Mg Tabs (Ferrous Sulfate) .Marland KitchenMarland KitchenMarland Kitchen  Take 1 Tablet By Mouth Once A Day 5)  Prednisone 10 Mg Tabs (Prednisone) .... 3 Pills A Day For 4 Days, Then 2 Pills A Day For 4 Days, Then 1 Pill A Day For 4 Days, Then Stop 6)  Flovent Hfa 220 Mcg/act Aero (Fluticasone Propionate  Hfa) .Marland Kitchen.. 1 Puff 2 Times A Day  Allergies (verified): No Known Drug Allergies  Past History:  Past Medical History: Last updated: 07/21/2006 DVT, hx of Pulmonary embolism, hx of Chronic right groin pain Leukopenia, hx of Hypercoagulable state Asthma Anemia-iron deficiency  Past Surgical History: Last updated: 07/21/2006 Caesarean section x5 Inguinal herniorrhaphy Excision of abdominal wall mass (6/06) Tubal ligation  Family History: Last updated: 07/04/2009 Cancer- unknown type, aunt, uncle on mother's side Diabetes- father, paternal  grandmother, mother's side of family, but not mother HTN- mother Heart failure-mother CAD with stent- mother Paternal grandmother- unsure of health history, but had blood clots  Social History: Last updated: 02/28/2010 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 2010 (planned for [place] at that time) Smokes about 6 cigarettes a day. Has smoked since age 7. Uses alcohol, only bud light beer - about 2 beers/week. No cocaine use at this time, has used in the past. Never used crack. Stopping marijuana use currently. Exercises regularly. Incarcerated again in 01/2010  Risk Factors: Alcohol Use: <1 (04/23/2010)  Risk Factors: Smoking Status: current (04/23/2010) Packs/Day: 1/2 ppd (04/23/2010)  Family History: Reviewed history from 07/04/2009 and no changes required. Cancer- unknown type, aunt, uncle on mother's side Diabetes- father, paternal grandmother, mother's side of family, but not mother HTN- mother Heart failure-mother CAD with stent- mother Paternal grandmother- unsure of health history, but had blood clots  Review of Systems      See HPI       The patient complains of dyspnea on exertion and peripheral edema.  The patient denies fever, weight loss, and weight gain.    Physical Exam  General:  alert, well-developed, and well-hydrated.   Head:  normocephalic and atraumatic.   Eyes:  pupils equal, pupils round, and pupils reactive to light.   Mouth:  pharynx pink and moist.   Neck:  supple and no masses.   Lungs:  normal respiratory effort, no intercostal retractions, no accessory muscle use, no dullness, no fremitus, no crackles, and no wheezes.   Heart:  normal rate, no murmur, no gallop, and no rub.   Abdomen:  soft, non-tender, no distention, no masses, no guarding, and no rigidity.   Extremities:  no edema Skin:  turgor normal and color normal.     Impression & Recommendations:  Problem # 1:   ASTHMA (ICD-493.90) Assessment Deteriorated She came in today for worsening of breathing, nocturnal awakenings about 2 times a month.  Her 3 peak flows in the office were 225, 250 and 280. We gave her peak flow meter for home and called her back in 2 weeks to see if she is improving. We also did her O2 sats here which was 100% on RA. We started her on prednisone and increased the dose of flovent from 110 micrograms to 220 micrograms. Her updated medication list for this problem includes:    Ventolin Hfa 108 (90 Base) Mcg/act Aers (Albuterol sulfate) .Marland Kitchen... 2 puffs every 6 hours as needed    Prednisone 10 Mg Tabs (Prednisone) .Marland KitchenMarland KitchenMarland KitchenMarland Kitchen 3 pills a day for 4 days, then 2 pills a day for 4 days, then 1 pill  a day for 4 days, then stop    Flovent Hfa 220 Mcg/act Aero (Fluticasone propionate  hfa) .Marland Kitchen... 1 puff 2 times a day  Her updated medication list for this problem includes:    Ventolin Hfa 108 (90 Base) Mcg/act Aers (Albuterol sulfate) .Marland Kitchen... 2 puffs every 6 hours as needed  Problem # 2:  MEMORY LOSS (ICD-780.93) Assessment: New  She complained of some troubles with her memory. She has difficulty remembering her phone number and sometimes remebering the names of her children. Of note, the patient has history of depression for one year following the death of her mother.  We would like to rule out pseudodementia. We have ordered TSH. We would like to get a neurology consult with her next visit.  Orders: T-TSH [id number])  Problem # 3:  screening fior HIV Assessment: Comment Only The patient requested that she would like to undergo HIV screening test. We ordered HIV antibody and viral load and would follow up with the patient after the results.  Problem # 4:  HYPERCOAGULABLE STATE, PRIMARY (ICD-289.81) Assessment: Unchanged She did not complain of any new pain and is following the dose of warfarin as directed by anticoagulation clinic.  Complete Medication List: 1)  Ventolin Hfa 108 (90 Base)  Mcg/act Aers (Albuterol sulfate) .... 2 puffs every 6 hours as needed 2)  Seroquel 300 Mg Tabs (Quetiapine fumarate) .... Take 1 tab by mouth at bedtime 3)  Warfarin Sodium 5 Mg Tabs (Warfarin sodium) .... Tablet strength: 5mg  take as directed 4)  Ferrous Sulfate 325 (65 Fe) Mg Tabs (Ferrous sulfate) .... Take 1 tablet by mouth once a day 5)  Prednisone 10 Mg Tabs (Prednisone) .... 3 pills a day for 4 days, then 2 pills a day for 4 days, then 1 pill a day for 4 days, then stop 6)  Flovent Hfa 220 Mcg/act Aero (Fluticasone propionate  hfa) .Marland Kitchen.. 1 puff 2 times a day  Other Orders: T-HIV Antibody  (Reflex) ([id number])  Patient Instructions: 1)  Continue regular exercise. 2)  Increase the dose of flovent from 110 mc to 220 micrograms with 1 puff two times a day. 3)  Start her on prednisone 10 mg , 3 tablets a day for 4 days, 2 tablets  a day for 4 days and then 1 tablet a day for 4 days. 4)  Please schedule a follow-up appointment in 2 weeks. Prescriptions: FLOVENT HFA 220 MCG/ACT AERO (FLUTICASONE PROPIONATE  HFA) 1 puff 2 times a day  #1 x 6   Entered and Authorized by:   Zoila Shutter MD   Signed by:   Zoila Shutter MD on 04/23/2010   Method used:   Electronically to        [facility] [phone]* (retail)       [address], [place]  [address]       Ph: [phone] or [phone]       Fax: [phone]   RxID:   [id number] PREDNISONE 10 MG TABS (PREDNISONE) 3 pills a day for 4 days, then 2 pills a day for 4 days, then 1 pill a day for 4 days, then stop  #24 x 0   Entered and Authorized by:   Zoila Shutter MD   Signed by:   Zoila Shutter MD on 04/23/2010   Method used:   [facility]        [facility] [phone]* (  retail)       [address], [place]  [address]       Ph: [phone] or [phone]       Fax: [phone]   RxID:   [id number]   Prevention & Chronic Care Immunizations   Influenza vaccine: Fluvax 3+   (07/04/2009)   Influenza vaccine deferral: Deferred  (04/23/2010)    Tetanus booster: Not documented   Td booster deferral: Deferred  (04/23/2010)    Pneumococcal vaccine: Not documented  Other Screening   Pap smear: NEGATIVE FOR INTRAEPITHELIAL LESIONS OR MALIGNANCY.  (02/28/2010)   Pap smear due: 08/08/2009   Smoking status: current  (04/23/2010)  Lipids   Total Cholesterol: 181  (07/04/2009)   Lipid panel action/deferral: Lipid Panel ordered   LDL: 115  (07/04/2009)   LDL Direct: Not documented   HDL: 58  (07/04/2009)   Triglycerides: 41  (07/04/2009)   Process Orders Check Orders Results:     [facility]: Order checked:       NOT AUTHORIZED TO ORDER Tests Sent for requisitioning (April 23, 2010 11:19 PM):     04/23/2010: [facility] -- T-HIV Antibody  (Reflex) [[id number]] (signed)     04/23/2010: [facility] -- T-TSH [[id number]] (signed)

## 2010-10-30 NOTE — Miscellaneous (Signed)
[facility]: [facility]   Imported By: Florinda Marker 12/20/2009 14:49:57  _____________________________________________________________________  External Attachment:    Type:   Image     Comment:   External Document

## 2010-10-30 NOTE — Assessment & Plan Note (Signed)
Summary: COU/CH  Anticoagulant Therapy Managed by: Barbera Setters. Michele Gillespie  PharmD CACP PCP: [name] [facility] Attending: Coralee Pesa MD, Levada Schilling Indication 1: Deep vein thrombus Indication 2: Pulmonary  embolus Start date: 09/23/2009 Duration: Indefinite  Patient Assessment Reviewed by: Chancy Milroy PharmD  April 16, 2010 Medication review: verified warfarin dosage & schedule,verified previous prescription medications, verified doses & any changes, verified new medications, reviewed OTC medications, reviewed OTC health products-vitamins supplements etc Complications: none Dietary changes: none   Health status changes: none   Lifestyle changes: none   Recent/future hospitalizations: none   Recent/future procedures: none   Recent/future dental: none Patient Assessment Part 2:  Have you MISSED ANY DOSES or CHANGED TABLETS?  No missed Warfarin doses or changed tablets.  Have you had any BRUISING or BLEEDING ( nose or gum bleeds,blood in urine or stool)?  No reported bruising or bleeding in nose, gums, urine, stool.  Have you STARTED or STOPPED any MEDICATIONS, including OTC meds,herbals or supplements?  No other medications or herbal supplements were started or stopped.  Have you CHANGED your DIET, especially green vegetables,or ALCOHOL intake?  No changes in diet or alcohol intake.  Have you had any ILLNESSES or HOSPITALIZATIONS?  No reported illnesses or hospitalizations  Have you had any signs of CLOTTING?(chest discomfort,dizziness,shortness of breath,arms tingling,slurred speech,swelling or redness in leg)    No chest discomfort, dizziness, shortness of breath, tingling in arm, slurred speech, swelling, or redness in leg.     Treatment  Target INR: 2.0-3.0 INR: 2.90  Date: 03/05/2010 Regimen In:  150mg /wk   Regimen Out:     Sunday: 4 Tablet     Monday: 4 Tablet     Tuesday: 4 Tablet     Wednesday: 4 Tablet     Thursday: 4 Tablet      Friday: 4 Tablet     Saturday: 4  Tablet Total Weekly: 140mg /wk mg  Next INR Due: 04/30/2010 Adjusted by: Barbera Setters. Alexandria Lodge III PharmD CACP   Return to anticoagulation clinic:  04/30/2010 Time of next visit: 1030    Allergies: No Known Drug Allergies
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
## 2010-10-30 NOTE — Assessment & Plan Note (Signed)
Summary: pain in left arm/gg   Vital Signs:  Patient profile:   35 year old female Height:      65 inches (165.10 cm) Weight:      201.5 pounds (91.59 kg) BMI:     33.65 Temp:     97.3 degrees F (36.28 degrees C) oral Pulse rate:   80 / minute BP sitting:   127 / 85  (right arm) Cuff size:   large  Vitals Entered By: Cynda Familia Duncan Dull) (January 01, 2010 2:47 PM) CC: left arm pain for a few days with pain getting worse, constipation Is Patient Diabetic? No Pain Assessment Patient in pain? yes     Location: left arm Intensity: 10 Type: sharp Onset of pain  Constant (pain started a few days ago and has gotten worse) Nutritional Status BMI of > 30 = obese  Have you ever been in a relationship where you felt threatened, hurt or afraid?No   Does patient need assistance? Functional Status Self care Ambulation Normal   Primary Care Provider:  [name]  CC:  left arm pain for a few days with pain getting worse and constipation.  History of Present Illness: 35 yo f with Past Medical History:  DVT, hx of Pulmonary embolism, hx of Chronic right groin pain Leukopenia, hx of Hypercoagulable state Asthma Anemia-iron deficiency  Presents to clinic with L arm pain (she has dx of DVT 08/2009 in that arm) which was not completely treated as she was in jail for a while. Dr.Groce saw her today and restarted her warfarin.   Patient is otherwise doing well, and denies any other complaints.      Preventive Screening-Counseling & Management  Alcohol-Tobacco     Alcohol drinks/day: <1     Alcohol type: beer     Smoking Status: current     Packs/Day: 1/2 ppd     Year Started: 1987     Year Quit: jan 2008  Current Medications (verified): 1)  Ventolin Hfa 108 (90 Base) Mcg/act Aers (Albuterol Sulfate) .... 2 Puffs Every 6 Hours As Needed 2)  Seroquel 300 Mg Tabs (Quetiapine Fumarate) .... Take 1 Tab By Mouth At Bedtime 3)  Omeprazole 20 Mg Tbec (Omeprazole) .... Take 1  Tablet By Mouth Two Times A Day 4)  Coumadin 2.5 Mg Tabs (Warfarin Sodium) .... Take 5 Pills By Mouth Once Daily 5)  Sudafed 30 Mg Tabs (Pseudoephedrine Hcl) .... Take One Tablet Every 6 Hours As Needed For Ear Pain For One Week. 6)  Warfarin Sodium 5 Mg Tabs (Warfarin Sodium) .... Tablet Strength: 5mg  Take As Directed  Allergies (verified): No Known Drug Allergies  Review of Systems       .per HPI  Physical Exam  General:  alert, well-developed, and cooperative to examination.    Lungs:  normal respiratory effort, no accessory muscle use, normal breath sounds, no crackles, and no wheezes.  Heart:  normal rate, regular rhythm, no murmur, no gallop, and no rub.    Abdomen:  soft, non-tender, normal bowel sounds, no distention, no guarding, no rebound tenderness, no hepatomegaly, and no splenomegaly.    Msk:  no joint swelling, no joint warmth, and no redness over joints.    Extremities:  L Arm pain, no visible swelling or redness.  Neurologic:  alert & oriented X3, cranial nerves II-XII intact, strength normal in all extremities, sensation intact to light touch, and gait normal.     Skin:   turgor normal and no rashes.  Psych:  Oriented X3, memory intact for recent and remote, normally interactive, good eye contact, not anxious appearing, and not depressed appearing.    Impression & Recommendations:  Problem # 1:  DVT, HX OF (ICD-V12.51) Patient has DVT of L arm (diagnosed 08/2009) she was on and off her warfarin because she was in prison.  Will restart warfarin per Dr. Alexandria Lodge. and she will f/u with him for further adjustments and INR checks.   May need to stop warfarin in next few month to check for hypercoag panel as she has had multiple dvt and a PE in the past.   Her updated medication list for this problem includes:    Coumadin 2.5 Mg Tabs (Warfarin sodium) .Marland Kitchen... Take 5 pills by mouth once daily    Warfarin Sodium 5 Mg Tabs (Warfarin sodium) .Marland Kitchen... Tablet strength: 5mg  take as  directed  Complete Medication List: 1)  Ventolin Hfa 108 (90 Base) Mcg/act Aers (Albuterol sulfate) .... 2 puffs every 6 hours as needed 2)  Seroquel 300 Mg Tabs (Quetiapine fumarate) .... Take 1 tab by mouth at bedtime 3)  Omeprazole 20 Mg Tbec (Omeprazole) .... Take 1 tablet by mouth two times a day 4)  Coumadin 2.5 Mg Tabs (Warfarin sodium) .... Take 5 pills by mouth once daily 5)  Sudafed 30 Mg Tabs (Pseudoephedrine hcl) .... Take one tablet every 6 hours as needed for ear pain for one week. 6)  Warfarin Sodium 5 Mg Tabs (Warfarin sodium) .... Tablet strength: 5mg  take as directed  Patient Instructions: 1)  Please schedule a follow-up appointment in 3 months.   Prevention & Chronic Care Immunizations   Influenza vaccine: Fluvax 3+  (07/04/2009)    Tetanus booster: Not documented    Pneumococcal vaccine: Not documented  Other Screening   Pap smear: Not documented   Pap smear due: 08/08/2009   Smoking status: current  (01/01/2010)  Lipids   Total Cholesterol: 181  (07/04/2009)   Lipid panel action/deferral: Lipid Panel ordered   LDL: 115  (07/04/2009)   LDL Direct: Not documented   HDL: 58  (07/04/2009)   Triglycerides: 41  (07/04/2009)

## 2010-10-30 NOTE — Assessment & Plan Note (Signed)
Summary: COU/CH  Anticoagulant Therapy Managed by: Barbera Setters. Janie Morning  PharmD CACP PCP: [name] [facility] Attending: Blanch Media MD Indication 1: Deep vein thrombus Indication 2: Pulmonary  embolus Start date: 09/23/2009 Duration: Indefinite  Patient Assessment Reviewed by: Chancy Milroy PharmD  July 16, 2010 Medication review: verified warfarin dosage & schedule,verified previous prescription medications, verified doses & any changes, verified new medications, reviewed OTC medications, reviewed OTC health products-vitamins supplements etc Complications: none Dietary changes: none   Health status changes: none   Lifestyle changes: none   Recent/future hospitalizations: none   Recent/future procedures: none   Recent/future dental: none Patient Assessment Part 2:  Have you MISSED ANY DOSES or CHANGED TABLETS?  No missed Warfarin doses or changed tablets.  Have you had any BRUISING or BLEEDING ( nose or gum bleeds,blood in urine or stool)?  No reported bruising or bleeding in nose, gums, urine, stool.  Have you STARTED or STOPPED any MEDICATIONS, including OTC meds,herbals or supplements?  No other medications or herbal supplements were started or stopped.  Have you CHANGED your DIET, especially green vegetables,or ALCOHOL intake?  No changes in diet or alcohol intake.  Have you had any ILLNESSES or HOSPITALIZATIONS?  No reported illnesses or hospitalizations  Have you had any signs of CLOTTING?(chest discomfort,dizziness,shortness of breath,arms tingling,slurred speech,swelling or redness in leg)    No chest discomfort, dizziness, shortness of breath, tingling in arm, slurred speech, swelling, or redness in leg.     Treatment  Target INR: 2.0-3.0 INR: 2.4  Date: 07/16/2010 Regimen In:  135mg /wk INR reflects regimen in: 2.4  New  Tablet strength: : 5mg  Regimen Out:     Sunday: 4 Tablet     Monday: 3 Tablet     Tuesday: 4 Tablet     Wednesday: 4 Tablet  Thursday: 4 Tablet      Friday: 4 Tablet     Saturday: 4 Tablet Total Weekly: 135mg /wk mg  Next INR Due: 08/13/2010 Adjusted by: Barbera Setters. Alexandria Lodge III PharmD CACP   Return to anticoagulation clinic:  08/13/2010 Time of next visit: 1015    Allergies: No Known Drug Allergies
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
## 2010-10-30 NOTE — Assessment & Plan Note (Signed)
Summary: COU/CH  Anticoagulant Therapy Managed by: Barbera Setters. Michele Gillespie  PharmD CACP PCP: [name] [facility] Attending: Coralee Pesa MD, Levada Schilling Indication 1: Deep vein thrombus Indication 2: Pulmonary  embolus Start date: 09/23/2009 Duration: Indefinite  Patient Assessment Reviewed by: Chancy Milroy PharmD  May 14, 2010 Medication review: verified warfarin dosage & schedule,verified previous prescription medications, verified doses & any changes, verified new medications, reviewed OTC medications, reviewed OTC health products-vitamins supplements etc Complications: none Dietary changes: none   Health status changes: none   Lifestyle changes: none   Recent/future hospitalizations: none   Recent/future procedures: none   Recent/future dental: none Patient Assessment Part 2:  Have you MISSED ANY DOSES or CHANGED TABLETS?  No missed Warfarin doses or changed tablets.  Have you had any BRUISING or BLEEDING ( nose or gum bleeds,blood in urine or stool)?  No reported bruising or bleeding in nose, gums, urine, stool.  Have you STARTED or STOPPED any MEDICATIONS, including OTC meds,herbals or supplements?  YES. She is "OFF" of a 3-4 day Prednisone "burst" for exacerbation of reactive airway disease. Has been "off" now approximately 4 days she states.  Have you CHANGED your DIET, especially green vegetables,or ALCOHOL intake?  No changes in diet or alcohol intake.  Have you had any ILLNESSES or HOSPITALIZATIONS?  YES. States she has recenlty during the extremley hot weather--had an exacerbation of her asthma requiring prednisone burst therapy.  Have you had any signs of CLOTTING?(chest discomfort,dizziness,shortness of breath,arms tingling,slurred speech,swelling or redness in leg)    No chest discomfort, dizziness, shortness of breath, tingling in arm, slurred speech, swelling, or redness in leg.     Treatment  Target INR: 2.0-3.0 INR: 3.6  Date: 05/14/2010 Regimen In:   140mg /wk INR reflects regimen in: 3.6  New  Tablet strength: : 5mg  Regimen Out:     Sunday: 4 Tablet     Monday: 4 Tablet     Tuesday: 3 Tablet     Wednesday: 4 Tablet     Thursday: 4 Tablet      Friday: 3 Tablet     Saturday: 4 Tablet Total Weekly: 130mg /wk mg  Next INR Due: 05/28/2010 Adjusted by: Barbera Setters. Michele Gillespie PharmD CACP   Return to anticoagulation clinic:  05/28/2010 Time of next visit: 1115    Allergies: No Known Drug Allergies

## 2010-10-30 NOTE — Miscellaneous (Signed)
Summary: [facility]/RELEASE INFORMATION  [facility]/RELEASE INFORMATION   Imported By: Margie Billet 02/12/2010 14:39:15  _____________________________________________________________________  External Attachment:    Type:   Image     Comment:   External Document

## 2010-10-30 NOTE — Progress Notes (Signed)
Summary: refill/ hla  Phone Note Refill Request Message from:  Patient on October 23, 2009 4:20 PM  Refills Requested: Medication #1:  OMEPRAZOLE 20 MG TBEC Take 1 tablet by mouth two times a day   Dosage confirmed as above?Dosage Confirmed   Last Refilled: 09/03/2009 Initial call taken by: Marin Roberts RN,  October 23, 2009 4:20 PM  Follow-up for Phone Call        Refill approved-nurse to complete Follow-up by: Julaine Fusi  DO,  October 23, 2009 4:36 PM    Prescriptions: OMEPRAZOLE 20 MG TBEC (OMEPRAZOLE) Take 1 tablet by mouth two times a day  #60 x 3   Entered and Authorized by:   Julaine Fusi  DO   Signed by:   Julaine Fusi  DO on 10/23/2009   Method used:   [facility]        [facility] [phone]* (retail)       [address]       [place], [place]  [address]       Ph: [phone] or [phone]       Fax: [phone]   RxID:   [id number]

## 2010-10-30 NOTE — Assessment & Plan Note (Signed)
Summary: COU/CH  Anticoagulant Therapy Managed by: Barbera Setters. Michele Gillespie  PharmD CACP PCP: [name] [facility] Attending: Lowella Bandy MD Indication 1: Deep vein thrombus Indication 2: Pulmonary  embolus Start date: 09/23/2009 Duration: Indefinite  Patient Assessment Reviewed by: Chancy Milroy PharmD  January 15, 2010 Medication review: verified warfarin dosage & schedule,verified previous prescription medications, verified doses & any changes, verified new medications, reviewed OTC medications, reviewed OTC health products-vitamins supplements etc Complications: none Dietary changes: none   Health status changes: none   Lifestyle changes: none   Recent/future hospitalizations: none   Recent/future procedures: none   Recent/future dental: none Patient Assessment Part 2:  Have you MISSED ANY DOSES or CHANGED TABLETS?  No missed Warfarin doses or changed tablets.  Have you had any BRUISING or BLEEDING ( nose or gum bleeds,blood in urine or stool)?  No reported bruising or bleeding in nose, gums, urine, stool.  Have you STARTED or STOPPED any MEDICATIONS, including OTC meds,herbals or supplements?  No other medications or herbal supplements were started or stopped.  Have you CHANGED your DIET, especially green vegetables,or ALCOHOL intake?  No changes in diet or alcohol intake.  Have you had any ILLNESSES or HOSPITALIZATIONS?  No reported illnesses or hospitalizations  Have you had any signs of CLOTTING?(chest discomfort,dizziness,shortness of breath,arms tingling,slurred speech,swelling or redness in leg)    No chest discomfort, dizziness, shortness of breath, tingling in arm, slurred speech, swelling, or redness in leg.     Treatment  Target INR: 2.0-3.0 INR: 3.8  Date: 01/15/2010 Regimen In:  155mg /wk INR reflects regimen in: 3.8  New  Tablet strength: : 5mg  Regimen Out:     Sunday: 4 Tablet     Monday: 4 Tablet     Tuesday: 4 Tablet     Wednesday: 5 Tablet  Thursday: 4 Tablet      Friday: 4 Tablet     Saturday: 4 Tablet Total Weekly: 145mg /wk mg  Next INR Due: 02/05/2010 Adjusted by: Barbera Setters. Alexandria Lodge III PharmD CACP   Return to anticoagulation clinic:  02/05/2010 Time of next visit: 1515    Allergies: No Known Drug Allergies

## 2010-10-30 NOTE — Progress Notes (Signed)
Summary: phone note/gp  Phone Note Call from Patient   Caller: Patient Summary of Call: States she needs to see doctor for her Asthma.  She will need an appt.for a referral. Call transfered to [facility] for an appt. Initial call taken by: Chinita Pester RN,  April 23, 2010 11:10 AM

## 2010-10-30 NOTE — Progress Notes (Signed)
Summary: ROI  Phone Note Call from Patient   Caller: Patient Call For: [name] Complaint: Urinary/GYN Problems Summary of Call: Call from said that she is in [place] and wanted the Clinics to fax over her Coumadin dosing.  Pt will be sending over a ROI.  Pt stated that when this occured before the release went to the hospital Medical Records and the currect information was not passed on.  Pt was asked to indicate Dr. Nilda Riggs as her doctor. m She is also to provide the [facility] with the [facility] for the release to be sent.  Medical Records was also called and informed to send the faxed release to the [facility] at [phone].  Pt gave the number of [phone] to contact the [facility] Nurse. Angelina Ok RN  Feb 08, 2010 11:27 AM  Initial call taken by: Angelina Ok RN,  Feb 08, 2010 11:26 AM  Follow-up for Phone Call        ROI received pt's medication list faxed to the [facility]  Follow-up by: Angelina Ok RN,  Feb 08, 2010 4:43 PM

## 2010-10-30 NOTE — Assessment & Plan Note (Signed)
Summary: COU/CH  Anticoagulant Therapy Managed by: Michele Gillespie. Michele Gillespie  PharmD CACP PCP: Michele Riggs MD [facility] Attending: Rogelia Boga MD, Michele Gillespie Indication 1: Deep vein thrombus Indication 2: Pulmonary  embolus Start date: 09/23/2009 Duration: Indefinite  Patient Assessment Reviewed by: Michele Gillespie PharmD  May 28, 2010 Medication review: verified warfarin dosage & schedule,verified previous prescription medications, verified doses & any changes, verified new medications, reviewed OTC medications, reviewed OTC health products-vitamins supplements etc Complications: none Dietary changes: none   Health status changes: none   Lifestyle changes: none   Recent/future hospitalizations: none   Recent/future procedures: none   Recent/future dental: none Patient Assessment Part 2:  Have you MISSED ANY DOSES or CHANGED TABLETS?  No missed Warfarin doses or changed tablets.  Have you had any BRUISING or BLEEDING ( nose or gum bleeds,blood in urine or stool)?  No reported bruising or bleeding in nose, gums, urine, stool.  Have you STARTED or STOPPED any MEDICATIONS, including OTC meds,herbals or supplements?  No other medications or herbal supplements were started or stopped.  Have you CHANGED your DIET, especially green vegetables,or ALCOHOL intake?  No changes in diet or alcohol intake.  Have you had any ILLNESSES or HOSPITALIZATIONS?  No reported illnesses or hospitalizations  Have you had any signs of CLOTTING?(chest discomfort,dizziness,shortness of breath,arms tingling,slurred speech,swelling or redness in leg)    No chest discomfort, dizziness, shortness of breath, tingling in arm, slurred speech, swelling, or redness in leg.     Treatment  Target INR: 2.0-3.0 INR: 1.9  Date: 05/28/2010 Regimen In:  130mg /wk INR reflects regimen in: 1.9  New  Tablet strength: : 5mg  Regimen Out:     Sunday: 4 Tablet     Monday: 4 Tablet     Tuesday: 4 Tablet     Wednesday: 4 Tablet  Thursday: 4 Tablet      Friday: 4 Tablet     Saturday: 4 Tablet Total Weekly: 140mg/wk mg  Next INR Due: 06/11/2010 Adjusted by: Michele Gillespie PharmD CACP   Return to anticoagulation clinic:  06/11/2010 Time of next visit: 1100    Allergies: No Known Drug Allergies Prescriptions: WARFARIN SODIUM 5 MG TABS (WARFARIN SODIUM) Tablet Strength: 5mg Take as directed  #129 x 1   Entered by:   Michele Gillespie PharmD   Authorized by:   Michele Holdren Butcher MD   Signed by:   Michele Gillespie PharmD on 05/28/2010   Method used:   [facility]        [facility]  [address] [id number]ail[facility]       [address]       [place], [place]  [address]       Ph: [phone] or [phone]       Fax: [phone]   RxID:   [id number]

## 2010-10-30 NOTE — Progress Notes (Signed)
Summary: constipation/ hla  Phone Note Call from Patient   Summary of Call: pt called c/o constipation, she states she is "miserable", she is referred to urgent care and refuses, she asks if she can just see dr groce about it when she comes monday for her coumadin appt, she is informed he does not see pts for constipation and that first available appt will be wed, she then ask me what she can take, when i informed her i could not suggest any medications because i am not a md she hung up. Initial call taken by: Marin Roberts RN,  July 04, 2010 2:51 PM

## 2010-10-30 NOTE — Progress Notes (Signed)
  Phone Note Other Incoming   Summary of Call: pt presents c/o chest discomfort, hosp disch 12/29, hfu 1/3, pt was no show, states
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
## 2010-10-30 NOTE — Progress Notes (Signed)
Summary: refill/ hla  Phone Note Refill Request Message from:  Fax from Pharmacy on April 06, 2010 2:14 PM  Refills Requested: Medication #1:  WARFARIN SODIUM 5 MG TABS Tablet Strength: 5mg  Take as directed   Dosage confirmed as above?Dosage Confirmed   Last Refilled: 6/10 last coumadin 6/6  Initial call taken by: Marin Roberts RN,  April 06, 2010 2:14 PM  Follow-up for Phone Call        Refill approved-nurse to complete Follow-up by: Julaine Fusi  DO,  April 06, 2010 5:09 PM    Prescriptions: WARFARIN SODIUM 5 MG TABS (WARFARIN SODIUM) Tablet Strength: 5mg  Take as directed  #100 x 0   Entered and Authorized by:   Julaine Fusi  DO   Signed by:   Julaine Fusi  DO on 04/06/2010   Method used:   [facility] to        [facility] [phone][facility] (retail)       [address]       [place], [place]  [address]       Ph: [phone] or [phone]       Fax: [phone]   RxID:   [id number]

## 2010-10-30 NOTE — Assessment & Plan Note (Signed)
Summary: 261/CFB  Anticoagulant Therapy Managed by: Barbera Setters. Michele Gillespie  PharmD CACP PCP: [name] [facility] Attending: Darl Pikes, Beth Indication 1: Deep vein thrombus Indication 2: Pulmonary  embolus Start date: 09/23/2009 Duration: Indefinite  Patient Assessment Reviewed by: Chancy Milroy PharmD  November 13, 2009 Medication review: verified warfarin dosage & schedule,verified previous prescription medications, verified doses & any changes, verified new medications, reviewed OTC medications, reviewed OTC health products-vitamins supplements etc Complications: none Dietary changes: none   Health status changes: none   Lifestyle changes: none   Recent/future hospitalizations: none   Recent/future procedures: none   Recent/future dental: none Patient Assessment Part 2:  Have you MISSED ANY DOSES or CHANGED TABLETS?  No missed Warfarin doses or changed tablets.  Have you had any BRUISING or BLEEDING ( nose or gum bleeds,blood in urine or stool)?  No reported bruising or bleeding in nose, gums, urine, stool.  Have you STARTED or STOPPED any MEDICATIONS, including OTC meds,herbals or supplements?  No other medications or herbal supplements were started or stopped.  Have you CHANGED your DIET, especially green vegetables,or ALCOHOL intake?  No changes in diet or alcohol intake.  Have you had any ILLNESSES or HOSPITALIZATIONS?  No reported illnesses or hospitalizations  Have you had any signs of CLOTTING?(chest discomfort,dizziness,shortness of breath,arms tingling,slurred speech,swelling or redness in leg)    No chest discomfort, dizziness, shortness of breath, tingling in arm, slurred speech, swelling, or redness in leg.     Treatment  Target INR: 2.0-3.0 INR: 1.9  Date: 11/13/2009 Regimen In:  125mg /wk INR reflects regimen in: 1.9   Regimen Out:     Sunday: 4 Tablet     Thursday: 4 Tablet      Saturday: 4 Tablet Total Weekly: 140mg/wk mg  Next INR Due:  11/27/2009 Adjusted by: Derrin Currey B. Dmarius Reeder III PharmD CACP   Return to anticoagulation clinic:  11/27/2009 Time of next visit: 1530    Allergies: No Known Drug Allergies Prescriptions: WARFARIN SODIUM 5 MG TABS (WARFARIN SODIUM) Tablet Strength: 5mg Take as directed  #100 x 2   Entered by:   Jay Obert Espindola PharmD   Authorized by:   Beth Golding  DO   Signed by:   Jay Lexani Corona PharmD on 11/13/2009   Method used:   [facility]        [facility]  [address] [id number]       [address]       [place], [place]  [address]       Ph: [phone] or [phone]       Fax: [phone]   RxID:   [id number]

## 2010-10-30 NOTE — Assessment & Plan Note (Signed)
Summary: COU/CH  Anticoagulant Therapy Managed by: Barbera Setters. Janie Morning  PharmD CACP PCP: [name] [facility] Attending: Josem Kaufmann MD, Lawrence Indication 1: Deep vein thrombus Indication 2: Pulmonary  embolus Start date: 09/23/2009 Duration: Indefinite  Patient Assessment Reviewed by: Chancy Milroy PharmD  January 01, 2010 Medication review: verified warfarin dosage & schedule,verified previous prescription medications, verified doses & any changes, verified new medications, reviewed OTC medications, reviewed OTC health products-vitamins supplements etc Complications: thromboembolic Comments: Patient presents questioning as to whether or not she may have a blood clot in her left arm. States she has recently been incarcerated during which time she was not given warfarin she states. She was most recently released on Friday 1-Apr-11 and subsequently has NOT taken her warfarin--even after release. Dietary changes: none   Health status changes: none   Lifestyle changes: none   Recent/future hospitalizations: none   Recent/future procedures: none   Recent/future dental: none Patient Assessment Part 2:  Have you MISSED ANY DOSES or CHANGED TABLETS?  YES. No warfarin administered during incarceration. Upon release on 1-Apr-11 Friday last week--she did not resume warfarin upon her release.  Have you had any BRUISING or BLEEDING ( nose or gum bleeds,blood in urine or stool)?  No reported bruising or bleeding in nose, gums, urine, stool.  Have you STARTED or STOPPED any MEDICATIONS, including OTC meds,herbals or supplements?  No other medications or herbal supplements were started or stopped.  Have you CHANGED your DIET, especially green vegetables,or ALCOHOL intake?  No changes in diet or alcohol intake.  Have you had any ILLNESSES or HOSPITALIZATIONS?  No reported illnesses or hospitalizations  Have you had any signs of CLOTTING?(chest discomfort,dizziness,shortness of breath,arms  tingling,slurred speech,swelling or redness in leg)    YES. States she has pain in her left arm similar to that she had when previously diagnosed with an upper extremity VTE.    Treatment  Target INR: 2.0-3.0 INR: 1.0  Date: 01/01/2010 Regimen In:  155mg /wk INR reflects regimen in: 1.0   Regimen Out:     Sunday: 4 Tablet     Monday: 5 Tablet     Tuesday: 4 Tablet     Wednesday: 5 Tablet     Thursday: 4 Tablet      Friday: 5 Tablet     Saturday: 4 Tablet Total Weekly: 155mg /wk mg  Next INR Due: 01/08/2010 Adjusted by: Barbera Setters. Alexandria Lodge III PharmD CACP   Return to anticoagulation clinic:  01/08/2010 Time of next visit: 1415   Comments: She was supposed to have been on 155mg /wk warfarin regimen but has not. She has aways required very high doses of warfarin to sustain therapeutic response as measured by INR.  Allergies: No Known Drug Allergies

## 2010-10-30 NOTE — Progress Notes (Signed)
Summary: Records request from [facility]  Request for records received from [facility]. Request forwarded to [facility]. Dena Chavis  December 13, 2009 8:27 AM

## 2010-10-30 NOTE — Assessment & Plan Note (Signed)
Summary: hfu/coumadin/pcp-evans/hla   Vital Signs:  Patient profile:   35 year old female Height:      65 inches Weight:      201.3 pounds BMI:     33.62 Temp:     97.3 degrees F oral Pulse rate:   74 / minute BP sitting:   111 / 73  (right arm)  Vitals Entered By: Filomena Jungling NT II (October 05, 2009 3:33 PM) CC: HFU/ Is Patient Diabetic? No Nutritional Status BMI of > 30 = obese  Have you ever been in a relationship where you felt threatened, hurt or afraid?No   Does patient need assistance? Functional Status Self care Ambulation Normal   Primary Care Provider:  [name]  CC:  HFU/.  History of Present Illness: 35 yrs old female with Hx of PE and recent DVT in left arm presents for follow up. She had missed prior appointments. She is taking coumidin and wants her INR to be checked and refill on her coumidin.  She has no other complain. She had chest pain for which she was extensively evaluated in the ED yesterday. At presents she does not reprot any chest pain.   Preventive Screening-Counseling & Management  Alcohol-Tobacco     Alcohol drinks/day: <1     Alcohol type: beer     Smoking Status: current     Packs/Day: 0.25     Year Started: 1987     Year Quit: jan 2008  Current Medications (verified): 1)  Ventolin Hfa 108 (90 Base) Mcg/act Aers (Albuterol Sulfate) .... 2 Puffs Every 6 Hours As Needed 2)  Aspirin 325 Mg Tabs (Aspirin) .... Take 1 Tablet By Mouth Once A Day 3)  Seroquel 300 Mg Tabs (Quetiapine Fumarate) .... Take 1 Tab By Mouth At Bedtime 4)  Omeprazole 20 Mg Tbec (Omeprazole) .... Take 1 Tablet By Mouth Two Times A Day 5)  Coumadin 2.5 Mg Tabs (Warfarin Sodium) .... Take 5 Pills By Mouth Once Daily  Allergies (verified): No Known Drug Allergies  Past History:  Past Medical History: Last updated: 07/21/2006 DVT, hx of Pulmonary embolism, hx of Chronic right groin pain Leukopenia, hx of Hypercoagulable state Asthma Anemia-iron  deficiency  Past Surgical History: Last updated: 07/21/2006 Caesarean section x5 Inguinal herniorrhaphy Excision of abdominal wall mass (6/06) Tubal ligation  Family History: Last updated: 07/04/2009 Cancer- unknown type, aunt, uncle on mother's side Diabetes- father, paternal grandmother, mother's side of family, but not mother HTN- mother Heart failure-mother CAD with stent- mother Paternal grandmother- unsure of health history, but had blood clots  Social History: Last updated: 07/04/2009 Recieves child support 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 Has smoked since age 40. Uses alcohol, only bud light beer - about 2 beers/week. No cocaine use at this time, has used in the past. Never used crack. Stopping marijuana use currently. Exercises regularly.  Risk Factors: Alcohol Use: <1 (10/05/2009)  Risk Factors: Smoking Status: current (10/05/2009) Packs/Day: 0.25 (10/05/2009)  Review of Systems      See HPI  Physical Exam  General:  alert, well-developed, well-nourished, and well-hydrated.   Head:  no abnormalities observed.   Eyes:  pupils equal, pupils round, and pupils reactive to light.   Ears:  no external deformities.   Nose:  no external erythema.   Mouth:  pharynx pink and moist.   Neck:  supple and full ROM.  Lungs:  normal respiratory effort.   Heart:  normal rate, regular rhythm, no murmur, no JVD, and no HJR.   Abdomen:  soft, non-tender, normal bowel sounds, no distention, and no masses.   Msk:  no joint tenderness, no joint swelling, no joint warmth, and no redness over joints.   Neurologic:  alert & oriented X3, cranial nerves II-XII intact, sensation intact to light touch, sensation intact to pinprick, gait normal, and DTRs symmetrical and normal.   Psych:  Oriented X3, memory intact for recent and remote, and not anxious appearing.     Impression  & Recommendations:  Problem # 1:  HYPERCOAGULABLE STATE, PRIMARY (ICD-289.81) Pt is hypercoagulable with hx of DVT and PE. Pt INR is slowly creeping up with 12.5 mg once daily. I would refill with instructions to monitor INR with Dr. Alexandria Lodge in one week. I am unsure about why she does need high dose and her compliance remains questionable. She was in ED yesterday with chest pain and her INR was 1.33. Her goal INR is 2-3.   Problem # 2:  ANEMIA-IRON DEFICIENCY (ICD-280.9) Pt not on iron. She does not tolerate it well.  Hgb: 11.9 (07/04/2009)   Hct: 38.2 (07/04/2009)   Platelets: 219 (07/04/2009) RBC: 4.95 (07/04/2009)   RDW: 16.8 (07/04/2009)   WBC: 2.8 (07/04/2009) MCV: 77.2 (07/04/2009)   MCHC: 31.2 (07/04/2009)  Complete Medication List: 1)  Ventolin Hfa 108 (90 Base) Mcg/act Aers (Albuterol sulfate) .... 2 puffs every 6 hours as needed 2)  Aspirin 325 Mg Tabs (Aspirin) .... Take 1 tablet by mouth once a day 3)  Seroquel 300 Mg Tabs (Quetiapine fumarate) .... Take 1 tab by mouth at bedtime 4)  Omeprazole 20 Mg Tbec (Omeprazole) .... Take 1 tablet by mouth two times a day 5)  Coumadin 2.5 Mg Tabs (Warfarin sodium) .... Take 5 pills by mouth once daily  Patient Instructions: 1)  Follow up in one week with Dr. Alexandria Lodge. Cancel appointment with Dr. Logan Bores.  Prescriptions: COUMADIN 2.5 MG TABS (WARFARIN SODIUM) take 5 pills by mouth once daily  #100 x 0   Entered and Authorized by:   Clerance Lav MD   Signed by:   Clerance Lav MD on 10/05/2009   Method used:   [facility] to        [facility] [phone]* (retail)       [address]       [place], [place]  [address]       Ph: [phone] or [phone]       Fax: [phone]   RxID:   [id number]

## 2010-10-30 NOTE — Assessment & Plan Note (Signed)
Summary: EST-CK/FU/MEDS/CFB   Vital Signs:  Patient profile:   35 year old female Height:      64.75 inches Weight:      207.9 pounds BMI:     34.99 Temp:     98.0 degrees F oral Pulse rate:   66 / minute BP sitting:   120 / 88  (right arm)  Vitals Entered By: Filomena Jungling NT II (June 11, 2010 1:55 PM) CC: follow-up visit Is Patient Diabetic? No Pain Assessment Patient in pain? yes     Location: STOMACH Intensity: 4 Type: aching Onset of pain  Chronic Nutritional Status BMI of > 30 = obese  Have you ever been in a relationship where you felt threatened, hurt or afraid?No   Does patient need assistance? Functional Status Self care Ambulation Normal   Primary Care Provider:  [name]  CC:  follow-up visit.  History of Present Illness: 35 y/o woman with PMH significant for Asthma, DVT on coumadin, depression came into the clinic today for a followup visit. She says that she gets SOB walking uphill and sometimes wakes up at night from SOB. This has been unchanged from the past. She thinks she is feeling better and her inhlaers are helping her. She takes flovent just once daily. We adviced her to take the flovent twice daily and ventolin as needed. She also denies any fever, malaise, N/V/D.  Preventive Screening-Counseling & Management  Alcohol-Tobacco     Alcohol drinks/day: <1     Alcohol type: beer     Smoking Status: current     Packs/Day: 8 cigs per day     Year Started: 1987     Year Quit: jan 2008  Current Medications (verified): 1)  Ventolin Hfa 108 (90 Base) Mcg/act Aers (Albuterol Sulfate) .... 2 Puffs Every 6 Hours As Needed 2)  Warfarin Sodium 5 Mg Tabs (Warfarin Sodium) .... Tablet Strength: 5mg  Take As Directed 3)  Ferrous Sulfate 325 (65 Fe) Mg Tabs (Ferrous Sulfate) .... Take 1 Tablet By Mouth Once A Day 4)  Flovent Hfa 220 Mcg/act Aero (Fluticasone Propionate  Hfa) .Marland Kitchen.. 1 Puff 2 Times A Day 5)  Miralax  Powd (Polyethylene Glycol 3350)  .... Mix 1 Scoop (17g) in A Glass of Water or Juice and Drink Daily For Constipation 6)  Ra Nicotine 21 Mg/24hr Pt24 (Nicotine) .... Put One Patch Daily  Allergies (verified): No Known Drug Allergies  Review of Systems      See HPI  Physical Exam  General:  alert, well-developed, well-nourished, and well-hydrated.   Eyes:  pupils equal, pupils round, and pupils reactive to light.   Nose:  no external deformity.   Mouth:  good dentition.   Neck:  supple, full ROM, and no masses.   Lungs:  normal breath sounds, no dullness, no fremitus, no crackles, and no wheezes.   Heart:  normal rate, regular rhythm, no murmur, no gallop, no rub, no JVD, and no HJR.   Abdomen:  soft, non-tender, normal bowel sounds, no distention, no masses, no guarding, no rigidity, no hepatomegaly, and no splenomegaly.   Msk:  normal ROM, no joint tenderness, no joint swelling, no joint warmth, and no redness over joints.   Neurologic:  alert & oriented X3, cranial nerves II-XII intact, strength normal in all extremities, sensation intact to light touch, and sensation intact to pinprick.     Impression & Recommendations:  Problem # 1:  Preventive Health Care (ICD-V70.0) Assessment New She got her tetanus and  flu shot today. She says that she wants to quit smoking. We prescribed her nicotine patch and would arrange social worker consult with her next visit.  Problem # 2:  ASTHMA (ICD-493.90) Assessment: Unchanged Her symptoms have improved from her last visit. Her peak expiratory flowmeter readings at home are 300, 350.She reported that she was using her flovent once daily but we recommende that if needed she can use twice daily. Her updated medication list for this problem includes:    Ventolin Hfa 108 (90 Base) Mcg/act Aers (Albuterol sulfate) .Marland Kitchen... 2 puffs every 6 hours as needed    Flovent Hfa 220 Mcg/act Aero (Fluticasone propionate  hfa) .Marland Kitchen... 1 puff 2 times a day  Problem # 3:  HYPERCOAGULABLE STATE,  PRIMARY (ICD-289.81) Assessment: Unchanged She reports that she is compliant with her warfrin. She follows Dr. Alexandria Lodge in the anticoagulation clinic and [facility]. Appropriate dose adjustments were done. Encouraged her not to miss her her appointments.  Complete Medication List: 1)  Ventolin Hfa 108 (90 Base) Mcg/act Aers (Albuterol sulfate) .... 2 puffs every 6 hours as needed 2)  Warfarin Sodium 5 Mg Tabs (Warfarin sodium) .... Tablet strength: 5mg  take as directed 3)  Ferrous Sulfate 325 (65 Fe) Mg Tabs (Ferrous sulfate) .... Take 1 tablet by mouth once a day 4)  Flovent Hfa 220 Mcg/act Aero (Fluticasone propionate  hfa) .Marland Kitchen.. 1 puff 2 times a day 5)  Miralax Powd (Polyethylene glycol 3350) .... Mix 1 scoop (17g) in a glass of water or juice and drink daily for constipation 6)  Ra Nicotine 21 Mg/24hr Pt24 (Nicotine) .... Put one patch daily  Other Orders: Influenza Vaccine NON MCR (09811) Tdap => 92yrs IM (91478) Admin 1st Vaccine (29562)  Patient Instructions: 1)  F/U in the clinic in 2 months. 2)  Continue measuring your peak expiratory flow. 3)  If the breathing gets worse,come o the ER or call 911. 4)  Social worker consult for smoking cesssation with the next visit. 5)  Please schedule a follow-up appointment in 2 months. Prescriptions: RA NICOTINE 21 MG/24HR PT24 (NICOTINE) put one patch daily  #30 x 0   Entered and Authorized by:   Elyse Jarvis   Signed by:   Elyse Jarvis on 06/11/2010   Method used:   [facility]        [facility] [phone][facility] (retail)       [address] [place]  [address]       Ph: [phone] or [phone]       Fax: [phone]   RxID:   [id number]   Prevention & Chronic Care Immunizations   Influenza vaccine: Fluvax Non-MCR  (06/11/2010)   Influenza vaccine deferral: Deferred  (04/23/2010)    Tetanus booster: 06/11/2010: Tdap   Td booster deferral: Deferred  (04/23/2010)    Pneumococcal  vaccine: Not documented  Other Screening   Pap smear: NEGATIVE FOR INTRAEPITHELIAL LESIONS OR MALIGNANCY.  (02/28/2010)   Pap smear due: 08/08/2009   Smoking status: current  (06/11/2010)  Lipids   Total Cholesterol: 181  (07/04/2009)   Lipid panel action/deferral: Lipid Panel ordered   LDL: 115  (07/04/2009)   LDL Direct: Not documented   HDL: 58  (07/04/2009)   Triglycerides: 41  (07/04/2009)      Resource handout printed.   Nursing Instructions: Give Flu vaccine today Give tetanus booster today      Tetanus/Td Vaccine  Vaccine Type: Tdap    Site: left deltoid    Mfr: GlaxoSmithKline    Dose: 0.5 ml    Route: IM    Given by: Chinita Pester RN    Exp. Date: 06/20/2012    Lot #: [id number]    VIS given: 08/17/08 version given June 11, 2010.  Influenza Vaccine    Vaccine Type: Fluvax Non-MCR    Site: right deltoid    Mfr: GlaxoSmithKline    Dose: 0.5 ml    Route: IM    Given by: Chinita Pester RN    Exp. Date: 03/30/2011    Lot #: [id number]    VIS given: 04/24/10 version given June 11, 2010.  Flu Vaccine Consent Questions    Do you have a history of severe allergic reactions to this vaccine? no    Any prior history of allergic reactions to egg and/or gelatin? no    Do you have a sensitivity to the preservative Thimersol? no    Do you have a past history of Guillan-Barre Syndrome? no    Do you currently have an acute febrile illness? no    Have you ever had a severe reaction to latex? no    Vaccine information given and explained to patient? yes    Are you currently pregnant? no  Appended Document: EST-CK/FU/MEDS/CFB This is to be charged as level IV EST.

## 2010-10-30 NOTE — Assessment & Plan Note (Signed)
Summary: COU/VS  Anticoagulant Therapy Managed by: Barbera Setters. Janie Morning  PharmD CACP PCP: [name] [facility] Attending: Darl Pikes, Beth Indication 1: Deep vein thrombus Indication 2: Pulmonary  embolus Start date: 09/23/2009 Duration: Indefinite  Patient Assessment Reviewed by: Chancy Milroy PharmD  October 16, 2009 Medication review: verified warfarin dosage & schedule,verified previous prescription medications, verified doses & any changes, verified new medications, reviewed OTC medications, reviewed OTC health products-vitamins supplements etc Complications: none Dietary changes: none   Health status changes: none   Lifestyle changes: none   Recent/future hospitalizations: none   Recent/future procedures: none   Recent/future dental: none Patient Assessment Part 2:  Have you MISSED ANY DOSES or CHANGED TABLETS?  YES. States she missed 2 days of warfarin dosing prior to [facility] appointment. She was NOT compliant to her instructions to see me (as part of her discharge summary) on 3-Jan-11.  Have you had any BRUISING or BLEEDING ( nose or gum bleeds,blood in urine or stool)?  No reported bruising or bleeding in nose, gums, urine, stool.  Have you STARTED or STOPPED any MEDICATIONS, including OTC meds,herbals or supplements?  No other medications or herbal supplements were started or stopped.  Have you CHANGED your DIET, especially green vegetables,or ALCOHOL intake?  No changes in diet or alcohol intake.  Have you had any ILLNESSES or HOSPITALIZATIONS?  YES. Hospitalized 25-Dec-10 for upper extremity DVT.  Have you had any signs of CLOTTING?(chest discomfort,dizziness,shortness of breath,arms tingling,slurred speech,swelling or redness in leg)    No chest discomfort, dizziness, shortness of breath, tingling in arm, slurred speech, swelling, or redness in leg.     Treatment  Target INR: 2.0-3.0 INR: 0.9  Date: 10/16/2009 INR reflects regimen in: 0.9  New  Tablet strength: :  2.5mg  Regimen Out:     Sunday: 6 Tablet     Monday: 6 Tablet     Tuesday: 6 Tablet     Wednesday: 6 Tablet     Thursday: 6 Tablet      Friday: 6 Tablet     Saturday: 6 Tablet Total Weekly: 105mg /wk mg  Next INR Due: 10/23/2009 Adjusted by: Barbera Setters. Alexandria Lodge III PharmD CACP   Return to anticoagulation clinic:  10/23/2009 Time of next visit: 1615   Comments: Patient as been taking 5 x 2.5mg  warfarin tablets as per her discharge instructions/latest visit with Dr. Sherryll Burger in [facility]. I will instruct her (since she has a rather large supply of 2.5mg  warfarin tablets remaining in her bottle)----to use SIX (6) x 2.5mg  = 15mg  warfarin by mouth once daily until next INR on 24-Jan-11.  Allergies: No Known Drug Allergies

## 2010-10-30 NOTE — Assessment & Plan Note (Signed)
Summary: COU/VS  Anticoagulant Therapy Managed by: Barbera Setters. Janie Morning  PharmD CACP PCP: [name] [facility] Attending: Josem Kaufmann MD, Lawrence Indication 1: Deep vein thrombus Indication 2: Pulmonary  embolus Start date: 09/23/2009 Duration: Indefinite  Patient Assessment Reviewed by: Chancy Milroy PharmD  October 23, 2009 Medication review: verified warfarin dosage & schedule,verified previous prescription medications, verified doses & any changes, verified new medications, reviewed OTC medications, reviewed OTC health products-vitamins supplements etc Complications: none Dietary changes: none   Health status changes: none   Lifestyle changes: none   Recent/future hospitalizations: none   Recent/future procedures: none   Recent/future dental: none Patient Assessment Part 2:  Have you MISSED ANY DOSES or CHANGED TABLETS?  No missed Warfarin doses or changed tablets.  Have you had any BRUISING or BLEEDING ( nose or gum bleeds,blood in urine or stool)?  No reported bruising or bleeding in nose, gums, urine, stool.  Have you STARTED or STOPPED any MEDICATIONS, including OTC meds,herbals or supplements?  No other medications or herbal supplements were started or stopped.  Have you CHANGED your DIET, especially green vegetables,or ALCOHOL intake?  No changes in diet or alcohol intake.  Have you had any ILLNESSES or HOSPITALIZATIONS?  No reported illnesses or hospitalizations  Have you had any signs of CLOTTING?(chest discomfort,dizziness,shortness of breath,arms tingling,slurred speech,swelling or redness in leg)    No chest discomfort, dizziness, shortness of breath, tingling in arm, slurred speech, swelling, or redness in leg.     Treatment  Target INR: 2.0-3.0 INR: 1.7  Date: 10/23/2009 Regimen In:  105mg /wk INR reflects regimen in: 1.7  New  Tablet strength: : 5mg  Regimen Out:     Sunday: 3 Tablet     Monday: 4 Tablet     Tuesday: 4 Tablet     Wednesday: 4 Tablet  Thursday: 3 Tablet      Friday: 4 Tablet     Saturday: 3 Tablet Total Weekly: 125mg/wk mg  Next INR Due: 11/06/2009 Adjusted by: Nanna Ertle B. Brionna Romanek III PharmD CACP   Return to anticoagulation clinic:  11/06/2009 Time of next visit: 1600   Comments: Have changed solid oral dosage strength tablet FROM 2.5mg warfarin TO 5mg warfarin. Have sent new Rx electronically.   Allergies: No Known Drug Allergies Prescriptions: WARFARIN SODIUM 5 MG TABS (WARFARIN SODIUM) Tablet Strength: 5mg Take as directed  #100 x 1   Entered by:   Jay Sanaia Jasso PharmD   Authorized by:   Lawrence Klima MD   Signed by:   Jay Indigo Chaddock PharmD on 10/23/2009   Method used:   [facility]        [facility]  [address] [place] [address] [id number])       [address]       [place], [place]  [address]       Ph: [phone] or [phone]       Fax: [phone]   RxID:   [id number]

## 2010-10-30 NOTE — Miscellaneous (Signed)
  Clinical Lists Changes I went to ED to see her for her right upper arm swelling and pain which has been about one week. She was seen at [facility] this Monday. She supposed to take it definitely, but has not taken since 12/29/2009. She was restarted coumadin then and no doppler of her extremities was done. Today she went to ED and found left arm acute brachial thrombus. Her current INR 1.45 and monday 1. Have talked with attending Dr. Maurice March and we think it is a approriate to briging coumadin with lovenox for 4 days and asked her to follow her scheduled Coumadin clinic with Dr. Alexandria Lodge to adjust her coumadin dose. Also asked pharmacy for help and they will teach patient how to inject lovenox and medicine assistance to make she get all lovenox needed. Appreciate their great help.
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
## 2010-11-01 NOTE — Progress Notes (Addendum)
Summary: call, coumadin/ hla  Phone Note Call from Patient   Summary of Call: pt called, very angry, c/o about her coumadin not being filled, she was asked to hold while i contacted the pharm, [facility] w/ Freida Busman, he states [facility] did not [facility], i gave it to [place] and pt had hung up, i attempted to call her back and got no answer Initial call taken by: Marin Roberts RN,  October 17, 2010 1:53 PM

## 2010-11-01 NOTE — Progress Notes (Signed)
Summary: phone/gg  Phone Note Call from Patient   Caller: Patient Summary of Call: Pt called with c/o sores in mouth for 3 days. We have no appointments this week for evaluation so pt advised to go to [facility] for evaluation. Patient/caller verbalizes understanding of these instructions.  Initial call taken by: Merrie Roof RN,  September 26, 2010 9:43 AM

## 2010-11-01 NOTE — Progress Notes (Signed)
Summary: Refill/gh  Phone Note Refill Request Message from:  Patient on October 01, 2010 2:01 PM  Refills Requested: Medication #1:  FERROUS SULFATE 325 (65 FE) MG TABS Take 1 tablet by mouth once a day  Method Requested: Electronic Initial call taken by: Angelina Ok RN,  October 01, 2010 2:01 PM  Follow-up for Phone Call       Follow-up by: Blanch Media MD,  October 01, 2010 2:10 PM    Prescriptions: FERROUS SULFATE 325 (65 FE) MG TABS (FERROUS SULFATE) Take 1 tablet by mouth once a day  #30 Tablet x 3   Entered and Authorized by:   Blanch Media MD   Signed by:   Blanch Media MD on 10/01/2010   Method used:   [facility]        [facility] [phone]* (retail)       [address]       [place], [place]  [address]       Ph: [phone] or [phone]       Fax: [phone]   RxID:   [id number]

## 2010-11-01 NOTE — Assessment & Plan Note (Signed)
Summary: COU/CH  Anticoagulant Therapy Managed by: Barbera Setters. Janie Morning  PharmD CACP PCP: [name] [facility] Attending: Lowella Bandy MD Indication 1: Deep vein thrombus Indication 2: Pulmonary  embolus Start date: 09/23/2009 Duration: Indefinite  Patient Assessment Reviewed by: Chancy Milroy PharmD  October 01, 2010 Medication review: verified warfarin dosage & schedule,verified previous prescription medications, verified doses & any changes, verified new medications, reviewed OTC medications, reviewed OTC health products-vitamins supplements etc Complications: none Dietary changes: none   Health status changes: none   Lifestyle changes: none   Recent/future hospitalizations: none   Recent/future procedures: none   Recent/future dental: none Patient Assessment Part 2:  Have you MISSED ANY DOSES or CHANGED TABLETS?  No missed Warfarin doses or changed tablets.  Have you had any BRUISING or BLEEDING ( nose or gum bleeds,blood in urine or stool)?  No reported bruising or bleeding in nose, gums, urine, stool.  Have you STARTED or STOPPED any MEDICATIONS, including OTC meds,herbals or supplements?  No other medications or herbal supplements were started or stopped.  Have you CHANGED your DIET, especially green vegetables,or ALCOHOL intake?  No changes in diet or alcohol intake.  Have you had any ILLNESSES or HOSPITALIZATIONS?  No reported illnesses or hospitalizations  Have you had any signs of CLOTTING?(chest discomfort,dizziness,shortness of breath,arms tingling,slurred speech,swelling or redness in leg)    No chest discomfort, dizziness, shortness of breath, tingling in arm, slurred speech, swelling, or redness in leg.     Treatment  Target INR: 2.0-3.0 INR: 1.8  Date: 10/01/2010 Regimen In:  135mg /wk INR reflects regimen in: 1.8  New  Tablet strength: : 5mg  Regimen Out:     Sunday: 4 Tablet     Monday: 4 Tablet     Tuesday: 4 Tablet     Wednesday: 4 Tablet  Thursday: 4 Tablet      Friday: 4 Tablet     Saturday: 4 Tablet Total Weekly: 140mg /wk mg  Next INR Due: 10/22/2010 Adjusted by: Barbera Setters. Alexandria Lodge III PharmD CACP   Return to anticoagulation clinic:  10/22/2010 Time of next visit: 1415    Allergies: No Known Drug Allergies
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
## 2010-11-01 NOTE — Assessment & Plan Note (Addendum)
Summary: COU/CH  Anticoagulant Therapy Managed by: Barbera Setters. Janie Morning  PharmD CACP PCP: [name] [facility] Attending: Darl Pikes, Beth Indication 1: Deep vein thrombus Indication 2: Pulmonary  embolus Start date: 09/23/2009 Duration: Indefinite  Patient Assessment Reviewed by: Chancy Milroy PharmD  October 22, 2010 Medication review: verified warfarin dosage & schedule,verified previous prescription medications, verified doses & any changes, verified new medications, reviewed OTC medications, reviewed OTC health products-vitamins supplements etc Complications: none Dietary changes: none   Health status changes: none   Lifestyle changes: none   Recent/future hospitalizations: none   Recent/future procedures: none   Recent/future dental: none Patient Assessment Part 2:  Have you MISSED ANY DOSES or CHANGED TABLETS?  No missed Warfarin doses or changed tablets.  Have you had any BRUISING or BLEEDING ( nose or gum bleeds,blood in urine or stool)?  No reported bruising or bleeding in nose, gums, urine, stool.  Have you STARTED or STOPPED any MEDICATIONS, including OTC meds,herbals or supplements?  No other medications or herbal supplements were started or stopped.  Have you CHANGED your DIET, especially green vegetables,or ALCOHOL intake?  No changes in diet or alcohol intake.  Have you had any ILLNESSES or HOSPITALIZATIONS?  No reported illnesses or hospitalizations  Have you had any signs of CLOTTING?(chest discomfort,dizziness,shortness of breath,arms tingling,slurred speech,swelling or redness in leg)    No chest discomfort, dizziness, shortness of breath, tingling in arm, slurred speech, swelling, or redness in leg.     Treatment  Target INR: 2.0-3.0 INR: 3.5  Date: 10/22/2010 Regimen In:  140mg /wk INR reflects regimen in: 3.5  New  Tablet strength: : 5mg  Regimen Out:     Sunday: 4 Tablet     Monday: 4 Tablet     Tuesday: 3 Tablet     Wednesday: 4 Tablet  Thursday: 4 Tablet      Friday: 3 Tablet     Saturday: 4 Tablet Total Weekly: 130mg /wk mg  Next INR Due: 11/12/2010 Adjusted by: Barbera Setters. Alexandria Lodge III PharmD CACP   Return to anticoagulation clinic:  11/12/2010 Time of next visit: 1430   Comments: Using 5mg  strength warfarin tablets--patient is to  be given on Sundays/Mondays/Wednesdays/Thursdays and Saturdays--4x5mg  warfarin = 20mg ;  On TUESDAYS and FRIDAYS--patient is to be given 3x5mg  warfarin tablets = 15mg . Next INR on 13-Feb-12 at 2:30pm.  Allergies: No Known Drug Allergies

## 2010-11-01 NOTE — Progress Notes (Signed)
Summary: REfill/gh  Phone Note Refill Request Message from:  Patient on October 01, 2010 2:19 PM  Refills Requested: Medication #1:  TRIAMCINOLONE ACETONIDE 0.1 % PSTE Apply 3 times daily to affected area in mouth.  Method Requested: Electronic Initial call taken by: Angelina Ok RN,  October 01, 2010 2:21 PM    New/Updated Medications: TRIAMCINOLONE ACETONIDE 0.1 % PSTE (TRIAMCINOLONE ACETONIDE) Apply 3 times daily to affected area in mouth Prescriptions: TRIAMCINOLONE ACETONIDE 0.1 % PSTE (TRIAMCINOLONE ACETONIDE) Apply 3 times daily to affected area in mouth  #15 grams x 1   Entered and Authorized by:   Ulyess Mort MD   Signed by:   Ulyess Mort MD on 10/01/2010   Method used:   Electronically to        [facility] [phone]* (retail)       [address]       [place], [place]  [address]       Ph: [phone] or [phone]       Fax: [phone]   RxID:   [id number]

## 2010-11-05 ENCOUNTER — Encounter: Payer: Self-pay | Admitting: Internal Medicine

## 2010-11-07 NOTE — Progress Notes (Signed)
  Phone Note Outgoing Call   Call placed by: Theotis Barrio NT II,  October 29, 2010 5:00 PM Call placed to: Specialist Details for Reason: FOLLOW UP PT REFERRAL Summary of Call: CALLED AND SPOKE WITH KISHA AT PT/ PATIENT WAS CALLED, MESSAGE LEFT FOR PATIEN TO CALL THE OFFICE TO SET UP APPT. AS OF TODAY, PATIENT HAS NOT RETURNED CALL BACK TO THE PT OFFICE.

## 2010-11-12 ENCOUNTER — Ambulatory Visit (INDEPENDENT_AMBULATORY_CARE_PROVIDER_SITE_OTHER): Payer: Medicaid Other | Admitting: Pharmacist

## 2010-11-12 DIAGNOSIS — D6859 Other primary thrombophilia: Secondary | ICD-10-CM

## 2010-11-12 DIAGNOSIS — Z86718 Personal history of other venous thrombosis and embolism: Secondary | ICD-10-CM

## 2010-11-12 DIAGNOSIS — Z7901 Long term (current) use of anticoagulants: Secondary | ICD-10-CM

## 2010-11-12 LAB — POCT INR: INR: 2.5

## 2010-11-12 NOTE — Progress Notes (Signed)
Reviewed patient's PT/INR results and agree with treatment plan.  

## 2010-11-12 NOTE — Progress Notes (Signed)
Anti-Coagulation Progress Note  Michele Gillespie is a 35 y.o. female who is currently on an anti-coagulation regimen.    RECENT RESULTS: Recent results are below, the most recent result is correlated with a dose of 130 mg. per week: Lab Results  Component Value Date   INR 2.5 11/12/2010   INR 3.5 10/22/2010   INR 1.8 10/01/2010    ANTI-COAG DOSE:   Latest dosing instructions   Total Sun Mon Tue Wed Thu Fri Sat   130 20 mg 20 mg 15 mg 20 mg 20 mg 15 mg 20 mg    (5 mg4) (5 mg4) (5 mg3) (5 mg4) (5 mg4) (5 mg3) (5 mg4)         ANTICOAG SUMMARY: Anticoagulation Episode Summary              Current INR goal 2.0-3.0 Next INR check 12/17/2010   INR from last check 2.5 (11/12/2010)     Weekly max dose (mg)  Target end date Indefinite   Indications Personal History of Venous Thrombosis and Embolism, HYPERCOAGULABLE STATE, PRIMARY, Long term current use of anticoagulant   INR check location  Preferred lab    Send INR reminders to [facility]   Comments        Provider Role Specialty Phone number   Blanch Media  Internal Medicine [phone]        ANTICOAG TODAY: Anticoagulation Summary as of 11/12/2010              INR goal 2.0-3.0     Selected INR 2.5 (11/12/2010) Next INR check 12/17/2010   Weekly max dose (mg)  Target end date Indefinite   Indications Personal History of Venous Thrombosis and Embolism, HYPERCOAGULABLE STATE, PRIMARY, Long term current use of anticoagulant    Anticoagulation Episode Summary              INR check location  Preferred lab    Send INR reminders to ANTICOAG IMP   Comments        Provider Role Specialty Phone number   Blanch Media  Internal Medicine [phone]        PATIENT INSTRUCTIONS: Patient Instructions  Patient instructed to take medications as defined in the Anti-coagulation Track section of this encounter.  Patient instructed to take today's dose.  Patient verbalized understanding of these instructions.          FOLLOW-UP Return in 5 weeks (on 12/17/2010) for Follow up INR.  Hulen Luster, III Pharm.D., CACP

## 2010-11-12 NOTE — Patient Instructions (Signed)
Patient instructed to take medications as defined in the Anti-coagulation Track section of this encounter.  Patient instructed to take today's dose.  Patient verbalized understanding of these instructions.    

## 2010-11-26 ENCOUNTER — Ambulatory Visit (INDEPENDENT_AMBULATORY_CARE_PROVIDER_SITE_OTHER): Payer: Medicaid Other | Admitting: Internal Medicine

## 2010-11-26 ENCOUNTER — Encounter: Payer: Self-pay | Admitting: Internal Medicine

## 2010-11-26 VITALS — BP 110/78 | HR 80 | Temp 97.8°F | Resp 20 | Ht 65.0 in | Wt 219.8 lb

## 2010-11-26 DIAGNOSIS — Z3201 Encounter for pregnancy test, result positive: Secondary | ICD-10-CM

## 2010-11-26 DIAGNOSIS — Z7901 Long term (current) use of anticoagulants: Secondary | ICD-10-CM

## 2010-11-26 LAB — HCG, QUANTITATIVE, PREGNANCY: hCG, Beta Chain, Quant, S: <2 m[IU]/mL

## 2010-11-27 ENCOUNTER — Telehealth: Payer: Self-pay | Admitting: Internal Medicine

## 2010-11-27 ENCOUNTER — Encounter: Payer: Self-pay | Admitting: Internal Medicine

## 2010-11-27 DIAGNOSIS — Z3201 Encounter for pregnancy test, result positive: Secondary | ICD-10-CM | POA: Insufficient documentation
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
## 2010-11-27 NOTE — Assessment & Plan Note (Signed)
Patient reports compliance with Coumadin but does not want to have her INR checked today since she reports being in a rush. I advised her of the importance of check an INR. She has agreed to come back tomorrow if her pregnancy test is indeed positive and to have the rest of the lab tests drawn. I have advised her to stop taking Coumadin until we get beta hCG back. Risk of stopping Coumadin was discussed as well and patient verbalized understanding.

## 2010-11-27 NOTE — Assessment & Plan Note (Signed)
She reports pregnancy test positive back in July 2011 and reports her last menstrual period was at that time. This would put her roughly anywhere from 28-[redacted] weeks gestational age that since she has never had sonogram done this is clearly only an estimate. I am questioning if there is a pregnancy to begin with. I have discussed this case with Dr. Aundria Rud and we have agreed to obtain quantitative hCG levels document of pregnancy. This patient is on Coumadin I have advised her to stop taking Coumadin for today and tomorrow until we get the test results back. I have discussed potential risks of stopping Coumadin including recurrence of DVT and PE however in terms of pregnancy risk Coumadin is consider Class D. If this is indeed pregnant she will need to follow up with obstetrician in high risk pregnancy clinic. I have obtained patient's phone #[phone] to call her as we get the results back. We will defer her Pap smear and other lab testing till tomorrow until we get quantitative beta-hCG test back.

## 2010-11-27 NOTE — Assessment & Plan Note (Signed)
Patient was called and informed of the pregnancy test result. She was also told to resume taking Coumadin. She still believes that she is pregnant and I have advised her to go to [facility] hospital clinic she can have ultrasound done to ensure that there is no viable intrauterine pregnancy

## 2010-11-27 NOTE — Telephone Encounter (Signed)
Patient called and informed of pregnancy test results, I have referred her to [facility] clinic where she can get ultrasound to ensure that there is no IUP.

## 2010-11-27 NOTE — Patient Instructions (Signed)
Please be available at the phone number you have provided, and we will call you tomorrow with the results of beta hCG if that test is positive he will need to come in for further lab work. In addition if pregnancy test is positive he will need to be seen emergently and high risk pregnancy clinic by your obstetrician.

## 2010-11-28 ENCOUNTER — Inpatient Hospital Stay (HOSPITAL_COMMUNITY)
Admission: AD | Admit: 2010-11-28 | Discharge: 2010-11-28 | Discharge status: Against Medical Advice | Payer: Medicaid Other | Source: Ambulatory Visit | Attending: Obstetrics & Gynecology | Admitting: Obstetrics & Gynecology

## 2010-11-28 DIAGNOSIS — N949 Unspecified condition associated with female genital organs and menstrual cycle: Secondary | ICD-10-CM | POA: Insufficient documentation

## 2010-11-28 DIAGNOSIS — N938 Other specified abnormal uterine and vaginal bleeding: Secondary | ICD-10-CM | POA: Insufficient documentation

## 2010-11-28 LAB — CBC
Hemoglobin: 11.4 g/dL — ABNORMAL LOW (ref 12.0–15.0)
MCH: 25.2 pg — ABNORMAL LOW (ref 26.0–34.0)
MCHC: 31.9 g/dL (ref 30.0–36.0)
Platelets: 198 10*3/uL (ref 150–400)

## 2010-11-28 LAB — HCG, QUANTITATIVE, PREGNANCY: hCG, Beta Chain, Quant, S: <2 m[IU]/mL (ref <5)

## 2010-12-11 LAB — URINALYSIS, ROUTINE W REFLEX MICROSCOPIC
Bilirubin Urine: NEGATIVE
Ketones, ur: NEGATIVE mg/dL
Nitrite: NEGATIVE
pH: 6.5 (ref 5.0–8.0)

## 2010-12-11 LAB — POCT PREGNANCY, URINE: Preg Test, Ur: NEGATIVE

## 2010-12-11 LAB — RPR

## 2010-12-11 LAB — WET PREP, GENITAL
Clue Cells Wet Prep HPF POC: NONE SEEN
Trich, Wet Prep: NONE SEEN
WBC, Wet Prep HPF POC: NONE SEEN

## 2010-12-11 LAB — PROTIME-INR: INR: 2.94 — ABNORMAL HIGH (ref 0.00–1.49)

## 2010-12-11 LAB — URINE MICROSCOPIC-ADD ON

## 2010-12-12 LAB — PROTIME-INR
INR: 2.34 — ABNORMAL HIGH (ref 0.00–1.49)
Prothrombin Time: 25.8 seconds — ABNORMAL HIGH (ref 11.6–15.2)

## 2010-12-12 LAB — POCT PREGNANCY, URINE: Preg Test, Ur: NEGATIVE

## 2010-12-15 LAB — DIFFERENTIAL
Basophils Absolute: 0 10*3/uL (ref 0.0–0.1)
Basophils Relative: 1 % (ref 0–1)
Lymphocytes Relative: 49 % — ABNORMAL HIGH (ref 12–46)
Neutro Abs: 1.4 10*3/uL — ABNORMAL LOW (ref 1.7–7.7)
Neutrophils Relative %: 42 % — ABNORMAL LOW (ref 43–77)

## 2010-12-15 LAB — POCT I-STAT, CHEM 8
Calcium, Ion: 1.31 mmol/L (ref 1.12–1.32)
Hemoglobin: 12.6 g/dL (ref 12.0–15.0)
Sodium: 137 mEq/L (ref 135–145)
TCO2: 27 mmol/L (ref 0–100)

## 2010-12-15 LAB — PROTIME-INR
INR: 1.33 (ref 0.00–1.49)
Prothrombin Time: 16.4 seconds — ABNORMAL HIGH (ref 11.6–15.2)

## 2010-12-15 LAB — CBC
Hemoglobin: 11.7 g/dL — ABNORMAL LOW (ref 12.0–15.0)
MCHC: 33.6 g/dL (ref 30.0–36.0)
Platelets: 230 10*3/uL (ref 150–400)
RDW: 15.9 % — ABNORMAL HIGH (ref 11.5–15.5)

## 2010-12-15 LAB — APTT: aPTT: 28 seconds (ref 24–37)

## 2010-12-17 ENCOUNTER — Ambulatory Visit (INDEPENDENT_AMBULATORY_CARE_PROVIDER_SITE_OTHER): Payer: Medicaid Other | Admitting: Pharmacist

## 2010-12-17 DIAGNOSIS — I82409 Acute embolism and thrombosis of unspecified deep veins of unspecified lower extremity: Secondary | ICD-10-CM | POA: Insufficient documentation

## 2010-12-17 DIAGNOSIS — Z86718 Personal history of other venous thrombosis and embolism: Secondary | ICD-10-CM

## 2010-12-17 DIAGNOSIS — Z7901 Long term (current) use of anticoagulants: Secondary | ICD-10-CM

## 2010-12-17 DIAGNOSIS — I2699 Other pulmonary embolism without acute cor pulmonale: Secondary | ICD-10-CM | POA: Insufficient documentation

## 2010-12-17 DIAGNOSIS — D6859 Other primary thrombophilia: Secondary | ICD-10-CM

## 2010-12-17 LAB — POCT INR: INR: 2.5

## 2010-12-17 NOTE — Patient Instructions (Signed)
Patient instructed to take medications as defined in the Anti-coagulation Track section of this encounter.  Patient instructed to take today's dose.  Patient verbalized understanding of these instructions.    

## 2010-12-17 NOTE — Progress Notes (Signed)
Anti-Coagulation Progress Note  Michele Gillespie is a 35 y.o. female who is currently on an anti-coagulation regimen.    RECENT RESULTS: Recent results are below, the most recent result is correlated with a dose of 130 mg. per week: Lab Results  Component Value Date   INR 2.5 12/17/2010   INR 2.5 11/12/2010   INR 3.5 10/22/2010    ANTI-COAG DOSE:   Latest dosing instructions   Total Sun Mon Tue Wed Thu Fri Sat   130 20 mg 20 mg 15 mg 20 mg 20 mg 15 mg 20 mg    (5 mg4) (5 mg4) (5 mg3) (5 mg4) (5 mg4) (5 mg3) (5 mg4)         ANTICOAG SUMMARY: Anticoagulation Episode Summary              Current INR goal 2.0-3.0 Next INR check 01/14/2011   INR from last check 2.5 (12/17/2010)     Weekly max dose (mg)  Target end date Indefinite   Indications Pulmonary embolism and infarction, Deep venous thrombosis of leg, Long term current use of anticoagulant   [facility] check [facility]    Send INR reminders to [facility]   Comments             ANTICOAG TODAY: Anticoagulation Summary as of 12/17/2010              INR goal 2.0-3.0     Selected INR 2.5 (12/17/2010) Next INR check 01/14/2011   Weekly max dose (mg)  Target end date Indefinite   Indications Pulmonary embolism and infarction, Deep venous thrombosis of leg, Long term current use of anticoagulant    Anticoagulation Episode Summary              INR check location [facility] lab    Send INR reminders to [facility]AG [facility]   Comments             PATIENT INSTRUCTIONS: Patient Instructions  Patient instructed to take medications as defined in the Anti-coagulation Track section of this encounter.  Patient instructed to take today's dose.  Patient verbalized understanding of these instructions.        FOLLOW-UP Return in 4 weeks (on 01/14/2011) for Follow up INR.  Hulen Luster, III Pharm.D., CACP

## 2010-12-19 LAB — PROTIME-INR
INR: 1.48 (ref 0.00–1.49)
Prothrombin Time: 17.8 seconds — ABNORMAL HIGH (ref 11.6–15.2)

## 2010-12-19 LAB — CBC
MCHC: 32.6 g/dL (ref 30.0–36.0)
MCV: 75.8 fL — ABNORMAL LOW (ref 78.0–100.0)
Platelets: 185 10*3/uL (ref 150–400)
RBC: 4.19 MIL/uL (ref 3.87–5.11)
WBC: 3.5 10*3/uL — ABNORMAL LOW (ref 4.0–10.5)

## 2010-12-19 LAB — DIFFERENTIAL
Basophils Relative: 0 % (ref 0–1)
Eosinophils Absolute: 0 10*3/uL (ref 0.0–0.7)
Lymphs Abs: 1.4 10*3/uL (ref 0.7–4.0)
Monocytes Relative: 7 % (ref 3–12)
Neutro Abs: 1.8 10*3/uL (ref 1.7–7.7)
Neutrophils Relative %: 51 % (ref 43–77)

## 2010-12-19 LAB — BASIC METABOLIC PANEL
BUN: 10 mg/dL (ref 6–23)
Calcium: 8.4 mg/dL (ref 8.4–10.5)
Creatinine, Ser: 0.91 mg/dL (ref 0.4–1.2)
GFR calc Af Amer: >60 mL/min (ref >60)

## 2010-12-19 LAB — APTT: aPTT: 27 seconds (ref 24–37)

## 2010-12-25 ENCOUNTER — Encounter: Payer: Self-pay | Admitting: Internal Medicine

## 2010-12-25 ENCOUNTER — Ambulatory Visit (INDEPENDENT_AMBULATORY_CARE_PROVIDER_SITE_OTHER): Payer: Medicaid Other | Admitting: Internal Medicine

## 2010-12-25 VITALS — BP 124/87 | HR 60 | Temp 98.3°F | Ht 64.75 in | Wt 219.9 lb

## 2010-12-25 DIAGNOSIS — D6859 Other primary thrombophilia: Secondary | ICD-10-CM

## 2010-12-25 NOTE — Progress Notes (Signed)
  Subjective:    Patient ID: Michele Gillespie, female    DOB: [birth year], 35 y.o.   MRN: [id number]  HPI  Patient is here with the following concern:  "Ive been on this coumadin a long time, I can't get any insurance, and I need to know: do I really need to be on this coumadin? Why is my blood so 'thick'" Says she is not smoking, eating correctly, now not using any drugs-no IVDU history-so was hopeful that would make a difference in her outcome  We reviewed her history-unprovoked DVT with PE in 2006, and an unprovoked clot in left upper extremity (antecubital vein) 09/23/2009. Antiphospholipid antibody negative at that admission-she states that she had "lots of blood tests" one time at [facility] chart review I cannot locate consult notes or labs from 2007 when she said she was evaluated.   Prior recent note on "prgnancy test" (hcg < 5) noted   Review of Systems     Objective:   Physical Exam        Assessment & Plan:   We reviewed some basics: "If you had a clot in your arm as a second clot you absolutely need blood thinners" Lifestyle factors which she reviewed with me would not be a factor in a decision to anticoagulate or not Willl refer to hematology to review this with her, and evaluate for hypercoagulable state if not done previously As an extra precaution, recheck serum Hcg today.

## 2010-12-31 LAB — PROTEIN S ACTIVITY: Protein S Activity: 92 % (ref 69–129)

## 2010-12-31 LAB — CBC
HCT: 33.7 % — ABNORMAL LOW (ref 36.0–46.0)
Hemoglobin: 10.3 g/dL — ABNORMAL LOW (ref 12.0–15.0)
MCHC: 32.7 g/dL (ref 30.0–36.0)
MCHC: 33.1 g/dL (ref 30.0–36.0)
MCHC: 33.2 g/dL (ref 30.0–36.0)
MCHC: 33.4 g/dL (ref 30.0–36.0)
MCV: 76.7 fL — ABNORMAL LOW (ref 78.0–100.0)
MCV: 76.9 fL — ABNORMAL LOW (ref 78.0–100.0)
MCV: 76.9 fL — ABNORMAL LOW (ref 78.0–100.0)
Platelets: 175 10*3/uL (ref 150–400)
Platelets: 176 10*3/uL (ref 150–400)
Platelets: 179 10*3/uL (ref 150–400)
Platelets: 179 10*3/uL (ref 150–400)
RDW: 15.8 % — ABNORMAL HIGH (ref 11.5–15.5)
RDW: 15.9 % — ABNORMAL HIGH (ref 11.5–15.5)
WBC: 3 10*3/uL — ABNORMAL LOW (ref 4.0–10.5)
WBC: 3.1 10*3/uL — ABNORMAL LOW (ref 4.0–10.5)
WBC: 3.2 10*3/uL — ABNORMAL LOW (ref 4.0–10.5)

## 2010-12-31 LAB — ANTIPHOSPHOLIPID SYNDROME EVAL, BLD
DRVVT: 40.8 secs — ABNORMAL HIGH (ref 34.7–40.5)
PTT Lupus Anticoagulant: 34.9 secs (ref 32.0–43.4)
Phosphatydalserine, IgA: <20 U/mL (ref <20)
dRVVT Incubated 1:1 Mix: 40.5 secs (ref 36.1–47.0)

## 2010-12-31 LAB — PROTIME-INR
INR: 1.13 (ref 0.00–1.49)
INR: 1.15 (ref 0.00–1.49)
Prothrombin Time: 13.5 seconds (ref 11.6–15.2)
Prothrombin Time: 14.6 seconds (ref 11.6–15.2)

## 2010-12-31 LAB — PROTEIN C, TOTAL: Protein C, Total: 76 % (ref 70–140)

## 2010-12-31 LAB — DIFFERENTIAL
Basophils Absolute: 0 10*3/uL (ref 0.0–0.1)
Basophils Relative: 1 % (ref 0–1)
Eosinophils Absolute: 0.1 10*3/uL (ref 0.0–0.7)
Eosinophils Relative: 2 % (ref 0–5)
Neutrophils Relative %: 44 % (ref 43–77)

## 2010-12-31 LAB — CARDIOLIPIN ANTIBODIES, IGG, IGM, IGA
Anticardiolipin IgG: 4 GPL U/mL — ABNORMAL LOW (ref <10)
Anticardiolipin IgM: 4 MPL U/mL — ABNORMAL LOW (ref <10)

## 2010-12-31 LAB — PROTEIN C ACTIVITY: Protein C Activity: 132 % (ref 75–133)

## 2010-12-31 LAB — CARDIAC PANEL(CRET KIN+CKTOT+MB+TROPI)
CK, MB: 0.4 ng/mL (ref 0.3–4.0)
CK, MB: 0.6 ng/mL (ref 0.3–4.0)
Relative Index: INVALID (ref 0.0–2.5)
Total CK: 33 U/L (ref 7–177)
Total CK: 49 U/L (ref 7–177)
Troponin I: <0.01 ng/mL (ref 0.00–0.06)

## 2010-12-31 LAB — BASIC METABOLIC PANEL
BUN: 11 mg/dL (ref 6–23)
BUN: 12 mg/dL (ref 6–23)
CO2: 23 mEq/L (ref 19–32)
Calcium: 8.3 mg/dL — ABNORMAL LOW (ref 8.4–10.5)
Calcium: 8.4 mg/dL (ref 8.4–10.5)
Chloride: 108 mEq/L (ref 96–112)
Creatinine, Ser: 0.85 mg/dL (ref 0.4–1.2)
Creatinine, Ser: 1.04 mg/dL (ref 0.4–1.2)
Glucose, Bld: 98 mg/dL (ref 70–99)

## 2010-12-31 LAB — DRUGS OF ABUSE SCREEN W/O ALC, ROUTINE URINE
Amphetamine Screen, Ur: NEGATIVE
Benzodiazepines.: NEGATIVE
Creatinine,U: 201.3 mg/dL
Methadone: NEGATIVE

## 2010-12-31 LAB — ANTITHROMBIN III: AntiThromb III Func: 85 % (ref 76–126)

## 2010-12-31 LAB — HIV ANTIBODY (ROUTINE TESTING W REFLEX): HIV: NONREACTIVE

## 2010-12-31 LAB — PROTEIN S, TOTAL: Protein S Ag, Total: 75 % (ref 70–140)

## 2011-01-02 ENCOUNTER — Encounter: Payer: Medicaid Other | Admitting: Oncology

## 2011-01-05 LAB — DIFFERENTIAL
Basophils Absolute: 0 10*3/uL (ref 0.0–0.1)
Basophils Relative: 0 % (ref 0–1)
Eosinophils Relative: 1 % (ref 0–5)
Lymphocytes Relative: 46 % (ref 12–46)
Monocytes Absolute: 0.3 10*3/uL (ref 0.1–1.0)
Monocytes Relative: 9 % (ref 3–12)

## 2011-01-05 LAB — COMPREHENSIVE METABOLIC PANEL
AST: 19 U/L (ref 0–37)
Albumin: 3.8 g/dL (ref 3.5–5.2)
Alkaline Phosphatase: 28 U/L — ABNORMAL LOW (ref 39–117)
Chloride: 110 mEq/L (ref 96–112)
GFR calc Af Amer: >60 mL/min (ref >60)
Potassium: 3.6 mEq/L (ref 3.5–5.1)
Total Bilirubin: 0.8 mg/dL (ref 0.3–1.2)
Total Protein: 6.9 g/dL (ref 6.0–8.3)

## 2011-01-05 LAB — CBC
Platelets: 194 10*3/uL (ref 150–400)
WBC: 2.7 10*3/uL — ABNORMAL LOW (ref 4.0–10.5)

## 2011-01-05 LAB — LITHIUM LEVEL: Lithium Lvl: 0.28 mEq/L — ABNORMAL LOW (ref 0.80–1.40)

## 2011-01-10 LAB — URINALYSIS, ROUTINE W REFLEX MICROSCOPIC
Bilirubin Urine: NEGATIVE
Nitrite: NEGATIVE
Specific Gravity, Urine: 1.03 (ref 1.005–1.030)
Urobilinogen, UA: 0.2 mg/dL (ref 0.0–1.0)
pH: 5.5 (ref 5.0–8.0)

## 2011-01-10 LAB — DIFFERENTIAL
Basophils Absolute: 0 10*3/uL (ref 0.0–0.1)
Eosinophils Relative: 0 % (ref 0–5)
Lymphocytes Relative: 12 % (ref 12–46)
Lymphs Abs: 0.5 10*3/uL — ABNORMAL LOW (ref 0.7–4.0)
Neutro Abs: 3.3 10*3/uL (ref 1.7–7.7)
Neutrophils Relative %: 82 % — ABNORMAL HIGH (ref 43–77)

## 2011-01-10 LAB — LIPASE, BLOOD: Lipase: 17 U/L (ref 11–59)

## 2011-01-10 LAB — COMPREHENSIVE METABOLIC PANEL
AST: 18 U/L (ref 0–37)
BUN: 11 mg/dL (ref 6–23)
CO2: 20 mEq/L (ref 19–32)
Calcium: 8.7 mg/dL (ref 8.4–10.5)
Chloride: 109 mEq/L (ref 96–112)
Creatinine, Ser: 0.87 mg/dL (ref 0.4–1.2)
GFR calc Af Amer: >60 mL/min (ref >60)
GFR calc non Af Amer: >60 mL/min (ref >60)
Total Bilirubin: 0.7 mg/dL (ref 0.3–1.2)

## 2011-01-10 LAB — URINE MICROSCOPIC-ADD ON

## 2011-01-10 LAB — CBC
HCT: 35.8 % — ABNORMAL LOW (ref 36.0–46.0)
MCHC: 32.8 g/dL (ref 30.0–36.0)
MCV: 75.4 fL — ABNORMAL LOW (ref 78.0–100.0)
RBC: 4.76 MIL/uL (ref 3.87–5.11)

## 2011-01-14 ENCOUNTER — Ambulatory Visit (INDEPENDENT_AMBULATORY_CARE_PROVIDER_SITE_OTHER): Payer: Medicaid Other | Admitting: Pharmacist

## 2011-01-14 DIAGNOSIS — Z7901 Long term (current) use of anticoagulants: Secondary | ICD-10-CM

## 2011-01-14 DIAGNOSIS — Z86718 Personal history of other venous thrombosis and embolism: Secondary | ICD-10-CM

## 2011-01-14 DIAGNOSIS — D6859 Other primary thrombophilia: Secondary | ICD-10-CM

## 2011-01-14 LAB — POCT INR: INR: 4.6

## 2011-01-14 NOTE — Progress Notes (Signed)
Anti-Coagulation Progress Note  Michele Gillespie is a 35 y.o. female who is currently on an anti-coagulation regimen.    RECENT RESULTS: Recent results are below, the most recent result is correlated with a dose of 130 mg. per week: Lab Results  Component Value Date   INR 4.6 01/14/2011   INR 2.5 12/17/2010   INR 2.5 11/12/2010    ANTI-COAG DOSE:   Latest dosing instructions   Total Sun Mon Tue Wed Thu Fri Sat   120 20 mg 20 mg 15 mg 15 mg 15 mg 15 mg 20 mg    (5 mg4) (5 mg4) (5 mg3) (5 mg3) (5 mg3) (5 mg3) (5 mg4)         ANTICOAG SUMMARY: Anticoagulation Episode Summary              Current INR goal 2.0-3.0 Next INR check 01/28/2011   INR from last check 4.6! (01/14/2011)     Weekly max dose (mg)  Target end date Indefinite   Indications Pulmonary embolism and infarction, Deep venous thrombosis of leg, Long term current use of anticoagulant   [facility]    Send INR reminders to [facility]   Comments             ANTICOAG TODAY: Anticoagulation Summary as of 01/14/2011              INR goal 2.0-3.0     Selected INR 4.6! (01/14/2011) Next INR check 01/28/2011   Weekly max dose (mg)  Target end date Indefinite   Indications Pulmonary embolism and infarction, Deep venous thrombosis of leg, Long term current use of anticoagulant    Anticoagulation Episode Summary              INR check location [facility] lab    Send INR reminders to ANT[facility]AG [facility]   Comments             PATIENT INSTRUCTIONS: Patient Instructions  Patient instructed to take medications as defined in the Anti-coagulation Track section of this encounter.  Patient instructed to OMIT today's dose.  Patient verbalized understanding of these instructions.        FOLLOW-UP Return in 4 weeks (on 02/11/2011) for Follow up INR.  Hulen Luster, III Pharm.D., CACP

## 2011-01-14 NOTE — Patient Instructions (Signed)
Patient instructed to take medications as defined in the Anti-coagulation Track section of this encounter.  Patient instructed to OMIT today's dose.  Patient verbalized understanding of these instructions.    

## 2011-01-23 ENCOUNTER — Other Ambulatory Visit: Payer: Self-pay | Admitting: Hematology & Oncology

## 2011-01-23 ENCOUNTER — Ambulatory Visit (HOSPITAL_BASED_OUTPATIENT_CLINIC_OR_DEPARTMENT_OTHER): Payer: Medicaid Other | Admitting: Hematology & Oncology

## 2011-01-23 ENCOUNTER — Ambulatory Visit: Payer: Medicaid Other | Admitting: Hematology & Oncology

## 2011-01-23 DIAGNOSIS — Z7901 Long term (current) use of anticoagulants: Secondary | ICD-10-CM

## 2011-01-23 DIAGNOSIS — I82409 Acute embolism and thrombosis of unspecified deep veins of unspecified lower extremity: Secondary | ICD-10-CM

## 2011-01-23 DIAGNOSIS — D509 Iron deficiency anemia, unspecified: Secondary | ICD-10-CM

## 2011-01-23 DIAGNOSIS — J45909 Unspecified asthma, uncomplicated: Secondary | ICD-10-CM

## 2011-01-23 DIAGNOSIS — Z86718 Personal history of other venous thrombosis and embolism: Secondary | ICD-10-CM

## 2011-01-23 LAB — CBC WITH DIFFERENTIAL (CANCER CENTER ONLY)
BASO#: 0 10*3/uL (ref 0.0–0.2)
EOS%: 1.6 % (ref 0.0–7.0)
HCT: 35 % (ref 34.8–46.6)
HGB: 11.5 g/dL — ABNORMAL LOW (ref 11.6–15.9)
LYMPH#: 1.5 10*3/uL (ref 0.9–3.3)
LYMPH%: 48.4 % — ABNORMAL HIGH (ref 14.0–48.0)
MCHC: 32.9 g/dL (ref 32.0–36.0)
MCV: 77 fL — ABNORMAL LOW (ref 81–101)
MONO#: 0.2 10*3/uL (ref 0.1–0.9)
NEUT%: 43.1 % (ref 39.6–80.0)
RDW: 14.1 % (ref 11.1–15.7)

## 2011-01-28 ENCOUNTER — Other Ambulatory Visit: Payer: Self-pay | Admitting: *Deleted

## 2011-01-28 ENCOUNTER — Ambulatory Visit (INDEPENDENT_AMBULATORY_CARE_PROVIDER_SITE_OTHER): Payer: Medicaid Other | Admitting: Pharmacist

## 2011-01-28 DIAGNOSIS — Z86718 Personal history of other venous thrombosis and embolism: Secondary | ICD-10-CM

## 2011-01-28 DIAGNOSIS — D6859 Other primary thrombophilia: Secondary | ICD-10-CM

## 2011-01-28 DIAGNOSIS — D509 Iron deficiency anemia, unspecified: Secondary | ICD-10-CM

## 2011-01-28 DIAGNOSIS — Z7901 Long term (current) use of anticoagulants: Secondary | ICD-10-CM

## 2011-01-28 MED ORDER — WARFARIN SODIUM 5 MG PO TABS: ORAL_TABLET | ORAL | Status: DC

## 2011-01-28 NOTE — Progress Notes (Signed)
Anti-Coagulation Progress Note  Michele Gillespie is a 35 y.o. female who is currently on an anti-coagulation regimen.    RECENT RESULTS: Recent results are below, the most recent result is correlated with a dose of 120 mg. per week: Lab Results  Component Value Date   INR 4.4 01/28/2011   INR 4.6 01/14/2011   INR 2.5 12/17/2010    ANTI-COAG DOSE:   Latest dosing instructions   Total Sun Mon Tue Wed Thu Fri Sat   105 15 mg 15 mg 15 mg 15 mg 15 mg 15 mg 15 mg    (5 mg3) (5 mg3) (5 mg3) (5 mg3) (5 mg3) (5 mg3) (5 mg3)         ANTICOAG SUMMARY: Anticoagulation Episode Summary              Current INR goal 2.0-3.0 Next INR check 02/18/2011   INR from last check 4.4! (01/28/2011)     Weekly max dose (mg)  Target end date Indefinite   Indications Pulmonary embolism and infarction, Deep venous thrombosis of leg, Long term current use of anticoagulant   INR check location [facility]    Send INR reminders to [facility]   Comments             ANTICOAG TODAY: Anticoagulation Summary as of 01/28/2011              INR goal 2.0-3.0     Selected INR 4.4! (01/28/2011) Next INR check 02/18/2011   Weekly max dose (mg)  Target end date Indefinite   Indications Pulmonary embolism and infarction, Deep venous thrombosis of leg, Long term current use of anticoagulant    Anticoagulation Episode Summary              INR check [facility] lab    Send INR reminders to ANTICOAG [facility]   Comments             PATIENT INSTRUCTIONS: Patient Instructions  Patient instructed to take medications as defined in the Anti-coagulation Track section of this encounter.  Patient instructed to OMIT today's dose.  Patient verbalized understanding of these instructions.        FOLLOW-UP Return in 3 weeks (on 02/18/2011) for Follow up INR.  Hulen Luster, III Pharm.D., CACP

## 2011-01-28 NOTE — Patient Instructions (Signed)
Patient instructed to take medications as defined in the Anti-coagulation Track section of this encounter.  Patient instructed to OMIT today's dose.  Patient verbalized understanding of these instructions.    

## 2011-01-29 LAB — HYPERCOAGULABLE PANEL, COMPREHENSIVE
AntiThromb III Func: 103 % (ref 76–126)
Anticardiolipin IgA: 6 U/mL
Anticardiolipin IgG: 7 GPL U/mL
Anticardiolipin IgM: 7 [MPL'U]/mL
Beta-2 Glyco I IgG: 0 G Units
Beta-2-Glycoprotein I IgA: 8 A Units
Beta-2-Glycoprotein I IgM: 7 M Units
DRVVT 1:1 Mix: 40.1 s (ref 36.2–44.3)
DRVVT: 62.4 s — ABNORMAL HIGH (ref 36.2–44.3)
Lupus Anticoagulant: NOT DETECTED
PTT Lupus Anticoagulant: 39.4 s (ref 30.0–45.6)
Protein C Activity: <15 % (ref 75–133)
Protein C, Total: 30 % — ABNORMAL LOW (ref 72–160)
Protein S Activity: 22 % — ABNORMAL LOW (ref 69–129)
Protein S Total: 42 % — ABNORMAL LOW (ref 60–150)

## 2011-01-30 MED ORDER — FERROUS SULFATE 325 (65 FE) MG PO TABS: 325.0000 mg | ORAL_TABLET | Freq: Three times a day (TID) | ORAL | Status: DC

## 2011-01-30 NOTE — Progress Notes (Signed)
Rx called into [facility] on [address].

## 2011-02-15 NOTE — Discharge Summary (Signed)
Michele Gillespie, Michele Gillespie                ACCOUNT NO.:  [id number]   MEDICAL RECORD NO.:  [id number]          PATIENT TYPE:  INP   LOCATION:  6705                         FACILITY:  [facility]   PHYSICIAN:  C. Ulyess Mort, M.D.DATE OF BIRTH:  1976-09-11   DATE OF ADMISSION:  09/02/2005  DATE OF DISCHARGE:  09/11/2005                                 DISCHARGE SUMMARY   DISCHARGE DIAGNOSES:  1.  Deep venous thrombosis hypercoagulable state.  2.  Asthma.  3.  Microcytic anemia of iron deficiency.  4.  Sinusitis with rhinitis.   DISCHARGE MEDICATIONS:  1.  Coumadin 20 mg p.o. daily.  2.  Ambien 5 mg PRN for sleep.  3.  Albuterol PRN for asthma.  4.  Augmentin XR 125/62.5 two tablets q.12h. for sinusitis.   CONDITION ON DISCHARGE:  Stable.   FOLLOW UP:  Follow up in the outpatient clinic on September 13, 2005 as well  as follow up with hematology appointment in [facility].   PROCEDURE:  None.   CONSULTATIONS:  Pharmacy.   HISTORY OF PRESENT ILLNESS:  Michele Gillespie is a 35 year old African-American  female patient with past medical history significant for smoking, multiple  genitourinary problems, asthma and anemia.  She had last surgery in June of  2006 when a mass in the right side of the abdomen was discovered and  pathology did not find any signs of malignancy or atypia.  She presented to  the Urgent Care several days prior to August 24, 2005 and was found to  have right leg deep venous thrombosis.  She had been sent home on Lovenox  and Coumadin, however, she had been noncompliant and then she came in to  follow up at the emergency room after presenting with the lower extremity  pain.  She said she could not walk due to pain for two days before  presenting to the emergency room on September 02, 2005. She said she was aware  that she had blood clots and that she was supposed to get shot and pill and  report every 12 hours but she couldn't do any of that.  Her leg was not  getting better though and she came back to get evaluated.  She did not have  any fever or hemoptysis.  Some vomiting, the day before the admission but no  blood.  She had normal bowel movement.  No history of travel.  She is not  using birth control medication.  Tylenol makes her pain a little bit better,  walking makes it worse, and she has trouble standing up.  Another complaint  was headaches with pain of 5 to 6 on a scale of 10. The pain started 7 days  prior to this date and she has it at night and upon waking up and she also  complained of some congestion.   ALLERGIES:  No known drug allergies.   PAST MEDICAL HISTORY:  Asthma, anxiety, anemia.   PAST SURGICAL HISTORY:  Five Cesarean section's.  Inguinal hernia repair.  Removal of endometriosis and right sided mass in the patient's own words,  endometriosis but it was right sided mass removed on March 14, 2005.  No  miscarriages. She also had tubal ligation in 1997.   MEDICATIONS AT HOME:  1.  Albuterol twice a day.  2.  Iron supplements sometimes.  3.  Ambien PRN.   HABITS/SUBSTANCE ABUSE HISTORY:  She is a smoker. She has smoked since 1990.  Drinks alcohol sometimes and smokes marijuana sometimes. She is divorced.   FAMILY HISTORY:  Mother has hypertension, diabetes.  Father has diabetes.  There is also history of deep venous thrombosis in the family.   REVIEW OF SYSTEMS:  Other than those already mentioned she also had some  cough with green sputum.  She has had occasional urinary incontinence.  Headache as already described.   PHYSICAL EXAMINATION:  VITAL SIGNS:  On admission the temperature was 97.6,  blood pressure 128/78, pulse 93, respirations 20, oxygen saturation 98% on  room air.  GENERAL:  No apparent distress.  HEENT:  Eyes pupils equal, round, reactive to light and accommodation.  Extraocular movements intact.  NECK:  No lymphadenopathy, no thyromegaly.  PULMONARY:  Clear upon auscultation bilaterally.   CARDIOVASCULAR:  S1, S2, regular rate and rhythm.  No murmurs.  GASTROINTESTINAL:  Bowel sounds present.  No guarding or rebound tenderness.  No hepatosplenomegaly.  EXTREMITIES:  Painful, tender right leg with positive Homan's sign on right  side.  Pulses are present and symmetric.  SKIN:  Capillary refill is less than 3 seconds.  No bruises or cuts.  No  generalized lymphadenopathy.  NEUROLOGICAL:  Cranial nerves II-XII intact.  Motor strength 4/5  bilaterally.  Upper and lower extremities sensation intact.  PSYCHIATRIC:  Oriented X3.   LABORATORY DATA:  On admission Pro Time 12.9, INR 1.0, BMET shows sodium  139, potassium 3.9, chloride 108, cO2 24.6, BUN 10, creatinine 1.0.  Glucose  104.  Hemoglobin 12.2.   ASSESSMENT:  1.  Deep venous thrombosis, etiology to be determined.  2.  History of asthma.  3.  Anxiety.  4.  Anemia.  5.  Headache.   HOSPITAL COURSE:  PROBLEM #1:  DEEP VENOUS THROMBOSIS:  The patient was  admitted for deep venous thrombosis of the right leg confirmed by  ultrasound, positive for family of deep venous thromboses.  Pharmacy was  managing the Coumadin and Lovenox regimen and bridging therapy.  Initial  dose of Coumadin was 7.5 mg.  The patient had subtherapeutic INR for a week  until the dose was increased to 20 mg and then lastly to 22 mg upon which  INR was 2.1 on September 11, 2005.  She was discharged on Coumadin 20 mg  samples were given to her.  She will be followed in the outpatient clinic on  September 13, 2005 and will also get hematology appointment with Dr. Arlan Organ at [facility] to be further evaluated for her coagulation  disorders.   PROBLEM #2:  HEADACHES:  MRI and CT scan without contrast were negative for  any brain pathology.  The patient had a temperature of 100.9 on September 05, 2005 along with some signs of rhinitis and sinusitis including tenderness and purulent nasal discharge.  She was prescribed Augmentin XR  125/62.5 mg  to take two tablets q.12h. for 10 days.  Her headache resolved over the  hospital stay as well as signs of sinusitis have improved.  Prescription for  the antibiotic was given on discharge to complete the remaining four days of  the therapy but  symptoms resolved over the hospital stay.  Chest x-ray,  urinalysis and blood cultures x2 were ordered, all of which were negative.   PROBLEM #3: ASTHMA:  This was controlled well with the albuterol PRN and  oxygen in the hospital.  She will continue the albuterol at home.   PROBLEM #4:  MICROCYTIC IRON DEFICIENCY ANEMIA:  The patient was given  Ferrlecit dose while in the hospital.  She will be followed as an  outpatient.   DISCHARGE LABORATORY DATA:  On discharge, the BMET shows sodium 137,  potassium 4.2, chloride 109, cO2 25, BUN 10, creatinine 1.0, glucose 96,  white blood cell count 3.3, hemoglobin 11.21, hematocrit 33.3, platelet  count 221,000.  Pro Time 23.9, INR 2.1.      Vanetta Mulders, MD    ______________________________  C. Ulyess Mort, M.D.    DA/MEDQ  D:  09/11/2005  T:  09/12/2005  Job:  [id number]   cc:   Rose Phi. Myna Hidalgo, M.D.  Fax: [phone]

## 2011-02-15 NOTE — Op Note (Signed)
NAMEJAHMYA, Michele Gillespie                ACCOUNT NO.:  [id number]   MEDICAL RECORD NO.:  [id number]          PATIENT TYPE:  AMB   LOCATION:  [facility]                         FACILITY:  [facility]   PHYSICIAN:  Leonie Man, M.D.   DATE OF BIRTH:  [birth year]   DATE OF PROCEDURE:  03/14/2005  DATE OF DISCHARGE:                                 OPERATIVE REPORT   PREOPERATIVE DIAGNOSIS:  Painful abdominal wall mass on the right.   POSTOPERATIVE DIAGNOSIS:  Painful abdominal wall mass on the right.   PROCEDURES:  Excision of abdominal wall mass.   SURGEON:  Leonie Man, M.D.   ASSISTANT:  OR tech.   ANESTHESIA:  General.   SPECIMENS TO THE PATHOLOGIST:  Mass right lower abdominal wall.   ESTIMATED BLOOD LOSS WAS:  Minimal.   COMPLICATIONS:  None apparent. The patient returned to the PACU in good  condition.   HISTORY:  The patient is a 35 year old woman who has had five cesarean  sections, additionally she has had right-sided inguinal hernia repair which  was done through the same scar. She has developed a mass which she says is  extremely painful in this region. This maybe, we thought, possibly just a  scar cicatrix, but could not rule out endometriosis. Because of severe pain,  she comes to the operating room now for excision of this mass. The risks and  potential benefits of surgery have been fully discussed, all questions  answered and consent obtained.   DESCRIPTION OF PROCEDURE:  Following the induction of satisfactory general  anesthesia with the patient positioned supinely, the abdomen and lower groin  are prepped and draped to be included in a sterile operative field. The mass  which measures approximately 12 x 8 cm in greatest diameter is encircled  with an elliptical incision. It was deep and through the skin and  subcutaneous tissues. A flap on the abdominal wall was raised superiorly and  inferiorly down onto the pudendal skin. So as to excise the mass in its  entirety, the  dissection was carried down to the external oblique  aponeurosis fascia skirting the external ring and thereby excising the mass  in its entirety. The mass was removed and forwarded for pathologic  evaluation. Hemostasis was obtained with electrocautery. Sponge and  instrument counts were verified. The subcutaneous tissues closed with  interrupted 3-0 Vicryl sutures and the  skin was closed with running 4-0 Monocryl suture and then reinforced with  Steri-Strips. Sterile dressings applied. Anesthetic reversed. The patient  removed from the operating room to the recovery room in stable condition.  She tolerated the procedure well.       PB/MEDQ  D:  03/14/2005  T:  03/14/2005  Job:  [id number]

## 2011-02-18 ENCOUNTER — Ambulatory Visit: Payer: Medicaid Other

## 2011-02-20 ENCOUNTER — Emergency Department (HOSPITAL_COMMUNITY)
Admission: EM | Admit: 2011-02-20 | Discharge: 2011-02-20 | Disposition: A | Discharge status: Home / Self Care | Payer: Medicaid Other | Attending: Emergency Medicine | Admitting: Emergency Medicine

## 2011-02-20 DIAGNOSIS — F3130 Bipolar disorder, current episode depressed, mild or moderate severity, unspecified: Secondary | ICD-10-CM | POA: Insufficient documentation

## 2011-02-20 DIAGNOSIS — Z8659 Personal history of other mental and behavioral disorders: Secondary | ICD-10-CM | POA: Insufficient documentation

## 2011-02-20 DIAGNOSIS — J45909 Unspecified asthma, uncomplicated: Secondary | ICD-10-CM | POA: Insufficient documentation

## 2011-02-20 DIAGNOSIS — Z86718 Personal history of other venous thrombosis and embolism: Secondary | ICD-10-CM | POA: Insufficient documentation

## 2011-02-20 DIAGNOSIS — Z7901 Long term (current) use of anticoagulants: Secondary | ICD-10-CM | POA: Insufficient documentation

## 2011-02-20 DIAGNOSIS — R1110 Vomiting, unspecified: Secondary | ICD-10-CM | POA: Insufficient documentation

## 2011-02-20 DIAGNOSIS — R109 Unspecified abdominal pain: Secondary | ICD-10-CM | POA: Insufficient documentation

## 2011-02-20 DIAGNOSIS — Z79899 Other long term (current) drug therapy: Secondary | ICD-10-CM | POA: Insufficient documentation

## 2011-02-20 LAB — POCT PREGNANCY, URINE: Preg Test, Ur: NEGATIVE

## 2011-02-20 LAB — CBC
MCH: 25.3 pg — ABNORMAL LOW (ref 26.0–34.0)
MCHC: 32.3 g/dL (ref 30.0–36.0)
Platelets: 210 10*3/uL (ref 150–400)
RDW: 14.6 % (ref 11.5–15.5)

## 2011-02-20 LAB — URINE MICROSCOPIC-ADD ON

## 2011-02-20 LAB — COMPREHENSIVE METABOLIC PANEL
Alkaline Phosphatase: 37 U/L — ABNORMAL LOW (ref 39–117)
BUN: 16 mg/dL (ref 6–23)
Calcium: 9.6 mg/dL (ref 8.4–10.5)
Creatinine, Ser: 0.71 mg/dL (ref 0.4–1.2)
Glucose, Bld: 86 mg/dL (ref 70–99)
Total Protein: 6.3 g/dL (ref 6.0–8.3)

## 2011-02-20 LAB — URINALYSIS, ROUTINE W REFLEX MICROSCOPIC
Glucose, UA: NEGATIVE mg/dL
Leukocytes, UA: NEGATIVE
Nitrite: NEGATIVE
Specific Gravity, Urine: 1.017 (ref 1.005–1.030)
pH: 7 (ref 5.0–8.0)

## 2011-02-20 LAB — PROTIME-INR: Prothrombin Time: 23.2 seconds — ABNORMAL HIGH (ref 11.6–15.2)

## 2011-02-20 LAB — LIPASE, BLOOD: Lipase: 15 U/L (ref 11–59)

## 2011-03-11 ENCOUNTER — Ambulatory Visit: Payer: Medicaid Other

## 2011-03-18 ENCOUNTER — Ambulatory Visit (INDEPENDENT_AMBULATORY_CARE_PROVIDER_SITE_OTHER): Payer: Medicaid Other | Admitting: Pharmacist

## 2011-03-18 DIAGNOSIS — Z86718 Personal history of other venous thrombosis and embolism: Secondary | ICD-10-CM

## 2011-03-18 DIAGNOSIS — D6859 Other primary thrombophilia: Secondary | ICD-10-CM

## 2011-03-18 DIAGNOSIS — Z7901 Long term (current) use of anticoagulants: Secondary | ICD-10-CM

## 2011-03-18 NOTE — Patient Instructions (Signed)
Patient instructed to take medications as defined in the Anti-coagulation Track section of this encounter.  Patient instructed to take today's dose.  Patient verbalized understanding of these instructions.    

## 2011-03-18 NOTE — Progress Notes (Signed)
Anti-Coagulation Progress Note  MALAIAH VIRAMONTES is a 35 y.o. female who is currently on an anti-coagulation regimen.    RECENT RESULTS: Recent results are below, the most recent result is correlated with a dose of 105 mg. per week: Lab Results  Component Value Date   INR 2.2 03/18/2011   INR 2.04* 02/20/2011   INR 4.4 01/28/2011    ANTI-COAG DOSE:   Latest dosing instructions   Total Sun Mon Tue Wed Thu Fri Sat   105 15 mg 15 mg 15 mg 15 mg 15 mg 15 mg 15 mg    (5 mg3) (5 mg3) (5 mg3) (5 mg3) (5 mg3) (5 mg3) (5 mg3)         ANTICOAG SUMMARY: Anticoagulation Episode Summary              Current INR goal 2.0-3.0 Next INR check 04/15/2011   INR from last check 2.2 (03/18/2011)     Weekly max dose (mg)  Target end date Indefinite   Indications Pulmonary embolism and infarction, Deep venous thrombosis of leg, Long term current use of anticoagulant   IN[facility] location [facility]    Send INR reminders to ANTICOAG [facility]   Comments             ANTICOAG TODAY: Anticoagulation Summary as of 03/18/2011              INR goal 2.0-3.0     Selected INR 2.2 (03/18/2011) Next INR check 04/15/2011   Weekly max dose (mg)  Target end date Indefinite   Indications Pulmonary embolism and infarction, Deep venous thrombosis of leg, Long term current use of anticoagulant    Anticoagulation Episode Summary              INR check location [facility] Preferred lab    Send INR reminders to ANTICOAG [facility]   Comments             PATIENT INSTRUCTIONS: Patient Instructions  Patient instructed to take medications as defined in the Anti-coagulation Track section of this encounter.  Patient instructed to take today's dose.  Patient verbalized understanding of these instructions.        FOLLOW-UP Return in 4 weeks (on 04/15/2011) for Follow up INR.  Hulen Luster, III Pharm.D., CACP

## 2011-03-20 ENCOUNTER — Emergency Department (HOSPITAL_COMMUNITY)
Admission: EM | Admit: 2011-03-20 | Discharge: 2011-03-21 | Disposition: A | Discharge status: Home / Self Care | Payer: Medicaid Other | Attending: Emergency Medicine | Admitting: Emergency Medicine

## 2011-03-20 DIAGNOSIS — J45909 Unspecified asthma, uncomplicated: Secondary | ICD-10-CM | POA: Insufficient documentation

## 2011-03-20 DIAGNOSIS — Z7901 Long term (current) use of anticoagulants: Secondary | ICD-10-CM | POA: Insufficient documentation

## 2011-03-20 DIAGNOSIS — M79609 Pain in unspecified limb: Secondary | ICD-10-CM | POA: Insufficient documentation

## 2011-03-20 DIAGNOSIS — Z79899 Other long term (current) drug therapy: Secondary | ICD-10-CM | POA: Insufficient documentation

## 2011-03-20 DIAGNOSIS — F3130 Bipolar disorder, current episode depressed, mild or moderate severity, unspecified: Secondary | ICD-10-CM | POA: Insufficient documentation

## 2011-03-20 DIAGNOSIS — Z86718 Personal history of other venous thrombosis and embolism: Secondary | ICD-10-CM | POA: Insufficient documentation

## 2011-03-20 DIAGNOSIS — Z8659 Personal history of other mental and behavioral disorders: Secondary | ICD-10-CM | POA: Insufficient documentation

## 2011-03-21 ENCOUNTER — Ambulatory Visit (HOSPITAL_COMMUNITY)
Admission: RE | Admit: 2011-03-21 | Discharge: 2011-03-21 | Disposition: A | Discharge status: Home / Self Care | Payer: Medicaid Other | Source: Ambulatory Visit | Attending: Emergency Medicine | Admitting: Emergency Medicine

## 2011-03-21 DIAGNOSIS — M7989 Other specified soft tissue disorders: Secondary | ICD-10-CM | POA: Insufficient documentation

## 2011-03-21 DIAGNOSIS — Z7901 Long term (current) use of anticoagulants: Secondary | ICD-10-CM | POA: Insufficient documentation

## 2011-03-21 DIAGNOSIS — M79609 Pain in unspecified limb: Secondary | ICD-10-CM | POA: Insufficient documentation

## 2011-03-21 DIAGNOSIS — L988 Other specified disorders of the skin and subcutaneous tissue: Secondary | ICD-10-CM | POA: Insufficient documentation

## 2011-03-29 ENCOUNTER — Inpatient Hospital Stay (HOSPITAL_COMMUNITY)
Admission: AD | Admit: 2011-03-29 | Discharge: 2011-03-30 | Disposition: A | Discharge status: Home / Self Care | Payer: Medicaid Other | Source: Ambulatory Visit | Attending: Obstetrics & Gynecology | Admitting: Obstetrics & Gynecology

## 2011-03-29 DIAGNOSIS — N921 Excessive and frequent menstruation with irregular cycle: Secondary | ICD-10-CM

## 2011-03-29 DIAGNOSIS — R1032 Left lower quadrant pain: Secondary | ICD-10-CM

## 2011-03-29 DIAGNOSIS — N949 Unspecified condition associated with female genital organs and menstrual cycle: Secondary | ICD-10-CM

## 2011-03-29 DIAGNOSIS — N938 Other specified abnormal uterine and vaginal bleeding: Secondary | ICD-10-CM | POA: Insufficient documentation

## 2011-03-30 ENCOUNTER — Inpatient Hospital Stay (HOSPITAL_COMMUNITY): Payer: Medicaid Other

## 2011-03-30 LAB — URINALYSIS, ROUTINE W REFLEX MICROSCOPIC
Glucose, UA: NEGATIVE mg/dL
Leukocytes, UA: NEGATIVE
Nitrite: NEGATIVE
Specific Gravity, Urine: >1.03 — ABNORMAL HIGH (ref 1.005–1.030)
pH: 6 (ref 5.0–8.0)

## 2011-03-30 LAB — POCT PREGNANCY, URINE: Preg Test, Ur: NEGATIVE

## 2011-03-30 LAB — CBC
Hemoglobin: 11.9 g/dL — ABNORMAL LOW (ref 12.0–15.0)
Platelets: 199 10*3/uL (ref 150–400)
RBC: 4.62 MIL/uL (ref 3.87–5.11)
WBC: 3.6 10*3/uL — ABNORMAL LOW (ref 4.0–10.5)

## 2011-03-30 LAB — URINE MICROSCOPIC-ADD ON

## 2011-03-30 LAB — PROTIME-INR
INR: 1.8 — ABNORMAL HIGH (ref 0.00–1.49)
Prothrombin Time: 21.2 seconds — ABNORMAL HIGH (ref 11.6–15.2)

## 2011-03-30 LAB — APTT: aPTT: 30 seconds (ref 24–37)

## 2011-04-01 LAB — GC/CHLAMYDIA PROBE AMP, GENITAL
Chlamydia, DNA Probe: NEGATIVE
GC Probe Amp, Genital: NEGATIVE

## 2011-04-15 ENCOUNTER — Ambulatory Visit (INDEPENDENT_AMBULATORY_CARE_PROVIDER_SITE_OTHER): Payer: Medicaid Other | Admitting: Pharmacist

## 2011-04-15 DIAGNOSIS — Z7901 Long term (current) use of anticoagulants: Secondary | ICD-10-CM

## 2011-04-15 DIAGNOSIS — Z86718 Personal history of other venous thrombosis and embolism: Secondary | ICD-10-CM

## 2011-04-15 DIAGNOSIS — D6859 Other primary thrombophilia: Secondary | ICD-10-CM

## 2011-04-15 MED ORDER — WARFARIN SODIUM 5 MG PO TABS: ORAL_TABLET | ORAL | Status: DC

## 2011-04-15 NOTE — Progress Notes (Signed)
Anti-Coagulation Progress Note  Michele Gillespie is a 35 y.o. female who is currently on an anti-coagulation regimen.    RECENT RESULTS: Recent results are below, the most recent result is correlated with a dose of 105 mg. per week: Lab Results  Component Value Date   INR 2.0 04/15/2011   INR 1.80* 03/30/2011   INR 2.00* 03/20/2011    ANTI-COAG DOSE:   Latest dosing instructions   Total Sun Mon Tue Wed Thu Fri Sat   115 15 mg 20 mg 15 mg 15 mg 20 mg 15 mg 15 mg    (5 mg3) (5 mg4) (5 mg3) (5 mg3) (5 mg4) (5 mg3) (5 mg3)         ANTICOAG SUMMARY: Anticoagulation Episode Summary              Current INR goal 2.0-3.0 Next INR check 05/13/2011   INR from last check 2.0 (04/15/2011)     Weekly max dose (mg)  Target end date Indefinite   Indications Pulmonary embolism and infarction, Deep venous thrombosis of leg, Long term current use of anticoagulant   INR check location [facility] Preferred lab    Send INR reminders to ANTICOAG [facility]   Comments             ANTICOAG TODAY: Anticoagulation Summary as of 04/15/2011              INR goal 2.0-3.0     Selected INR 2.0 (04/15/2011) Next INR check 05/13/2011   Weekly max dose (mg)  Target end date Indefinite   Indications Pulmonary embolism and infarction, Deep venous thrombosis of leg, Long term current use of anticoagulant    Anticoagulation Episode Summary              INR check location [facility] Preferred lab    Send INR reminders to ANTICOAG [facility]   Comments             PATIENT INSTRUCTIONS: Patient Instructions  Patient instructed to take medications as defined in the Anti-coagulation Track section of this encounter.  Patient instructed to take today's dose.  Patient verbalized understanding of these instructions.        FOLLOW-UP Return in 4 weeks (on 05/13/2011) for Follow up INR.  Hulen Luster, III Pharm.D., CACP

## 2011-04-15 NOTE — Patient Instructions (Signed)
Patient instructed to take medications as defined in the Anti-coagulation Track section of this encounter.  Patient instructed to take today's dose.  Patient verbalized understanding of these instructions.    

## 2011-04-17 ENCOUNTER — Inpatient Hospital Stay (INDEPENDENT_AMBULATORY_CARE_PROVIDER_SITE_OTHER)
Admission: RE | Admit: 2011-04-17 | Discharge: 2011-04-17 | Disposition: A | Discharge status: Home / Self Care | Payer: Medicaid Other | Source: Ambulatory Visit | Attending: Emergency Medicine | Admitting: Emergency Medicine

## 2011-04-17 ENCOUNTER — Other Ambulatory Visit: Payer: Self-pay | Admitting: Internal Medicine

## 2011-04-17 DIAGNOSIS — Z1231 Encounter for screening mammogram for malignant neoplasm of breast: Secondary | ICD-10-CM

## 2011-04-17 DIAGNOSIS — N760 Acute vaginitis: Secondary | ICD-10-CM

## 2011-04-17 LAB — POCT URINALYSIS DIP (DEVICE)
Bilirubin Urine: NEGATIVE
Glucose, UA: NEGATIVE mg/dL
Leukocytes, UA: NEGATIVE
Nitrite: NEGATIVE
Urobilinogen, UA: 0.2 mg/dL (ref 0.0–1.0)

## 2011-04-17 LAB — WET PREP, GENITAL
Trich, Wet Prep: NONE SEEN
Yeast Wet Prep HPF POC: NONE SEEN

## 2011-04-18 LAB — GC/CHLAMYDIA PROBE AMP, GENITAL: Chlamydia, DNA Probe: NEGATIVE

## 2011-04-21 ENCOUNTER — Emergency Department (HOSPITAL_COMMUNITY)
Admission: EM | Admit: 2011-04-21 | Discharge: 2011-04-21 | Disposition: A | Discharge status: Home / Self Care | Payer: Medicaid Other | Attending: Emergency Medicine | Admitting: Emergency Medicine

## 2011-04-21 DIAGNOSIS — Z79899 Other long term (current) drug therapy: Secondary | ICD-10-CM | POA: Insufficient documentation

## 2011-04-21 DIAGNOSIS — Z7901 Long term (current) use of anticoagulants: Secondary | ICD-10-CM | POA: Insufficient documentation

## 2011-04-21 DIAGNOSIS — S40029A Contusion of unspecified upper arm, initial encounter: Secondary | ICD-10-CM | POA: Insufficient documentation

## 2011-04-21 DIAGNOSIS — Z8659 Personal history of other mental and behavioral disorders: Secondary | ICD-10-CM | POA: Insufficient documentation

## 2011-04-21 DIAGNOSIS — J45909 Unspecified asthma, uncomplicated: Secondary | ICD-10-CM | POA: Insufficient documentation

## 2011-04-21 DIAGNOSIS — F3130 Bipolar disorder, current episode depressed, mild or moderate severity, unspecified: Secondary | ICD-10-CM | POA: Insufficient documentation

## 2011-04-21 DIAGNOSIS — Z86718 Personal history of other venous thrombosis and embolism: Secondary | ICD-10-CM | POA: Insufficient documentation

## 2011-04-21 LAB — DIFFERENTIAL
Basophils Absolute: 0 10*3/uL (ref 0.0–0.1)
Eosinophils Relative: 1 % (ref 0–5)
Lymphocytes Relative: 40 % (ref 12–46)
Lymphs Abs: 1.4 10*3/uL (ref 0.7–4.0)
Monocytes Absolute: 0.3 10*3/uL (ref 0.1–1.0)
Neutro Abs: 1.6 10*3/uL — ABNORMAL LOW (ref 1.7–7.7)

## 2011-04-21 LAB — CBC
HCT: 37.3 % (ref 36.0–46.0)
Hemoglobin: 12.5 g/dL (ref 12.0–15.0)
MCV: 77.9 fL — ABNORMAL LOW (ref 78.0–100.0)
RDW: 14.6 % (ref 11.5–15.5)
WBC: 3.4 10*3/uL — ABNORMAL LOW (ref 4.0–10.5)

## 2011-04-21 LAB — PROTIME-INR: INR: 2.12 — ABNORMAL HIGH (ref 0.00–1.49)

## 2011-04-24 ENCOUNTER — Telehealth: Payer: Self-pay | Admitting: *Deleted

## 2011-04-24 NOTE — Telephone Encounter (Signed)
Pt called stating she was started on augmentin  875-125 mg  # 14 .  She is to see Dr Alexandria Lodge on 8/13 Also, she needs to see DDS for cracked molar.  She needs letter stating she can be off coumadin for 2 days for oral care.  Pt # [id number]  Talked with Dr Alexandria Lodge and he would like to see pt on Friday at 1000. Pt informed and also scheduled.

## 2011-04-26 ENCOUNTER — Ambulatory Visit: Payer: Medicaid Other

## 2011-05-08 ENCOUNTER — Ambulatory Visit: Payer: Medicaid Other | Admitting: Obstetrics and Gynecology

## 2011-05-13 ENCOUNTER — Ambulatory Visit (INDEPENDENT_AMBULATORY_CARE_PROVIDER_SITE_OTHER): Payer: Medicaid Other | Admitting: Pharmacist

## 2011-05-13 DIAGNOSIS — D6859 Other primary thrombophilia: Secondary | ICD-10-CM

## 2011-05-13 DIAGNOSIS — Z86718 Personal history of other venous thrombosis and embolism: Secondary | ICD-10-CM

## 2011-05-13 DIAGNOSIS — Z7901 Long term (current) use of anticoagulants: Secondary | ICD-10-CM

## 2011-05-13 LAB — POCT INR: INR: 1.5

## 2011-05-13 NOTE — Progress Notes (Signed)
Anti-Coagulation Progress Note  Michele Gillespie is a 35 y.o. female who is currently on an anti-coagulation regimen.    RECENT RESULTS: Recent results are below, the most recent result is correlated with a dose of 105 mg. per week: Lab Results  Component Value Date   INR 1.50 05/13/2011   INR 2.12* 04/21/2011   INR 2.0 04/15/2011    ANTI-COAG DOSE:   Latest dosing instructions   Total Sun Mon Tue Wed Thu Fri Sat   120 15 mg 20 mg 15 mg 20 mg 15 mg 20 mg 15 mg    (5 mg3) (5 mg4) (5 mg3) (5 mg4) (5 mg3) (5 mg4) (5 mg3)         ANTICOAG SUMMARY: Anticoagulation Episode Summary              Current INR goal 2.0-3.0 Next INR check 05/27/2011   INR from last check 1.50! (05/13/2011)     Weekly max dose (mg)  Target end date Indefinite   Indications Pulmonary embolism and infarction, Deep venous thrombosis of leg, Long term current use of anticoagulant   INR check location [facility]    Send INR reminders to ANTICOAG [facility]   Comments             ANTICOAG TODAY: Anticoagulation Summary as of 05/13/2011              INR goal 2.0-3.0     Selected INR 1.50! (05/13/2011) Next INR check 05/27/2011   Weekly max dose (mg)  Target end date Indefinite   Indications Pulmonary embolism and infarction, Deep venous thrombosis of leg, Long term current use of anticoagulant    Anticoagulation Episode Summary              INR check location [facility]erred lab    Send INR reminders to ANTICOAG [facility]   Comments             PATIENT INSTRUCTIONS: Patient Instructions  Patient instructed to take medications as defined in the Anti-coagulation Track section of this encounter.  Patient instructed to take today's dose.  Patient verbalized understanding of these instructions.        FOLLOW-UP Return in 2 weeks (on 05/27/2011) for Follow up INR.  Hulen Luster, III Pharm.D., CACP

## 2011-05-13 NOTE — Patient Instructions (Signed)
Patient instructed to take medications as defined in the Anti-coagulation Track section of this encounter.  Patient instructed to take today's dose.  Patient verbalized understanding of these instructions.    

## 2011-05-13 NOTE — Progress Notes (Signed)
There appears to have been recurrent thrombosis as the indication for chronic anticoagulation.  Agree with the plan.

## 2011-05-21 ENCOUNTER — Ambulatory Visit: Payer: Medicaid Other

## 2011-05-27 ENCOUNTER — Ambulatory Visit: Payer: Medicaid Other

## 2011-06-10 ENCOUNTER — Ambulatory Visit
Admission: RE | Admit: 2011-06-10 | Discharge: 2011-06-10 | Disposition: A | Discharge status: Home / Self Care | Payer: Medicaid Other | Source: Ambulatory Visit | Attending: Internal Medicine | Admitting: Internal Medicine

## 2011-06-10 ENCOUNTER — Encounter: Payer: Self-pay | Admitting: Internal Medicine

## 2011-06-10 ENCOUNTER — Ambulatory Visit (INDEPENDENT_AMBULATORY_CARE_PROVIDER_SITE_OTHER): Payer: Medicaid Other | Admitting: Pharmacist

## 2011-06-10 DIAGNOSIS — Z1231 Encounter for screening mammogram for malignant neoplasm of breast: Secondary | ICD-10-CM

## 2011-06-10 DIAGNOSIS — Z7901 Long term (current) use of anticoagulants: Secondary | ICD-10-CM

## 2011-06-10 DIAGNOSIS — D6859 Other primary thrombophilia: Secondary | ICD-10-CM

## 2011-06-10 DIAGNOSIS — Z86718 Personal history of other venous thrombosis and embolism: Secondary | ICD-10-CM

## 2011-06-10 NOTE — Progress Notes (Signed)
Anti-Coagulation Progress Note  Michele Gillespie is a 35 y.o. female who is currently on an anti-coagulation regimen.    RECENT RESULTS: Recent results are below, the most recent result is correlated with a dose of 120 mg. per week: Lab Results  Component Value Date   INR 3.0 06/10/2011   INR 1.50 05/13/2011   INR 2.12* 04/21/2011    ANTI-COAG DOSE:   Latest dosing instructions   Total Sun Mon Tue Wed Thu Fri Sat   115 15 mg 17.5 mg 15 mg 20 mg 15 mg 17.5 mg 15 mg    (5 mg3) (5 mg3.5) (5 mg3) (5 mg4) (5 mg3) (5 mg3.5) (5 mg3)         ANTICOAG SUMMARY: Anticoagulation Episode Summary              Current INR goal 2.0-3.0 Next INR check 07/08/2011   INR from last check 3.0 (06/10/2011)     Weekly max dose (mg)  Target end date Indefinite   Indications Pulmonary embolism and infarction, Deep venous thrombosis of leg, Long term current use of anticoagulant   IN[facility] check location [facility] Preferred lab    Send INR reminders to ANTICOAG [facility]   Comments             ANTICOAG TODAY: Anticoagulation Summary as of 06/10/2011              INR goal 2.0-3.0     Selected INR 3.0 (06/10/2011) Next INR check 07/08/2011   Weekly max dose (mg)  Target end date Indefinite   Indications Pulmonary embolism and infarction, Deep venous thrombosis of leg, Long term current use of anticoagulant    Anticoagulation Episode Summary              INR check location [facility] Preferred lab    Send INR reminders to ANTICOAG [facility]   Comments             PATIENT INSTRUCTIONS: Patient Instructions  Patient instructed to take medications as defined in the Anti-coagulation Track section of this encounter.  Patient instructed to take today's dose.  Patient verbalized understanding of these instructions.        FOLLOW-UP Return in 4 weeks (on 07/08/2011) for Follow up INR.  Hulen Luster, III Pharm.D., CACP

## 2011-06-10 NOTE — Patient Instructions (Signed)
Patient instructed to take medications as defined in the Anti-coagulation Track section of this encounter.  Patient instructed to take today's dose.  Patient verbalized understanding of these instructions.    

## 2011-06-19 LAB — DIFFERENTIAL
Basophils Absolute: 0
Eosinophils Absolute: 0
Eosinophils Relative: 1
Lymphocytes Relative: 45
Monocytes Absolute: 0.4

## 2011-06-19 LAB — CBC
HCT: 34.7 — ABNORMAL LOW
Hemoglobin: 11.2 — ABNORMAL LOW
MCV: 71.7 — ABNORMAL LOW
Platelets: 212
RDW: 18.7 — ABNORMAL HIGH

## 2011-06-19 LAB — URINALYSIS, ROUTINE W REFLEX MICROSCOPIC
Bilirubin Urine: NEGATIVE
Ketones, ur: NEGATIVE
Nitrite: NEGATIVE
Protein, ur: NEGATIVE
Urobilinogen, UA: 1
pH: 7.5

## 2011-06-19 LAB — BASIC METABOLIC PANEL
BUN: 9
CO2: 26
Chloride: 112
GFR calc non Af Amer: >60
Glucose, Bld: 101 — ABNORMAL HIGH
Potassium: 4.3
Sodium: 141

## 2011-06-19 LAB — POCT PREGNANCY, URINE
Operator id: 285841
Preg Test, Ur: NEGATIVE

## 2011-06-19 LAB — URINE MICROSCOPIC-ADD ON

## 2011-06-20 LAB — COMPREHENSIVE METABOLIC PANEL
ALT: 17
AST: 16
Albumin: 3.2 — ABNORMAL LOW
Alkaline Phosphatase: 31 — ABNORMAL LOW
BUN: 9
CO2: 24
Calcium: 8.7
Chloride: 105
Creatinine, Ser: 0.89
GFR calc Af Amer: >60
GFR calc non Af Amer: >60
Glucose, Bld: 89
Potassium: 3.6
Sodium: 133 — ABNORMAL LOW
Total Bilirubin: 0.6
Total Protein: 6.2

## 2011-06-20 LAB — CBC
HCT: 32.7 — ABNORMAL LOW
Hemoglobin: 10.7 — ABNORMAL LOW
MCHC: 32.7
MCV: 71.6 — ABNORMAL LOW
Platelets: 224
RBC: 4.57
RDW: 19 — ABNORMAL HIGH
WBC: 2.7 — ABNORMAL LOW

## 2011-06-20 LAB — URINALYSIS, ROUTINE W REFLEX MICROSCOPIC
Ketones, ur: NEGATIVE
Leukocytes, UA: NEGATIVE
Nitrite: NEGATIVE
Protein, ur: NEGATIVE
pH: 6.5

## 2011-06-20 LAB — RPR: RPR Ser Ql: NONREACTIVE

## 2011-06-20 LAB — DIFFERENTIAL
Basophils Absolute: 0
Basophils Relative: 1
Eosinophils Absolute: 0
Eosinophils Relative: 1
Lymphocytes Relative: 50 — ABNORMAL HIGH
Lymphs Abs: 1.3
Monocytes Absolute: 0.3
Monocytes Relative: 10
Neutro Abs: 1 — ABNORMAL LOW
Neutrophils Relative %: 39 — ABNORMAL LOW

## 2011-06-20 LAB — URINE MICROSCOPIC-ADD ON

## 2011-06-20 LAB — GC/CHLAMYDIA PROBE AMP, URINE
Chlamydia, Swab/Urine, PCR: NEGATIVE
GC Probe Amp, Urine: NEGATIVE

## 2011-06-20 LAB — WET PREP, GENITAL

## 2011-06-20 LAB — LIPASE, BLOOD: Lipase: 15

## 2011-07-01 ENCOUNTER — Encounter: Payer: Medicaid Other | Admitting: Internal Medicine

## 2011-07-03 ENCOUNTER — Encounter: Payer: Medicaid Other | Admitting: Obstetrics and Gynecology

## 2011-07-03 LAB — RAPID URINE DRUG SCREEN, HOSP PERFORMED
Amphetamines: NOT DETECTED
Barbiturates: NOT DETECTED
Opiates: NOT DETECTED

## 2011-07-08 ENCOUNTER — Ambulatory Visit: Payer: Medicaid Other

## 2011-07-09 LAB — POCT URINALYSIS DIP (DEVICE)
Bilirubin Urine: NEGATIVE
Ketones, ur: NEGATIVE
pH: 7.5

## 2011-07-09 LAB — POCT PREGNANCY, URINE: Operator id: 239701

## 2011-07-15 LAB — GC/CHLAMYDIA PROBE AMP, GENITAL: GC Probe Amp, Genital: NEGATIVE

## 2011-07-15 LAB — WET PREP, GENITAL
Trich, Wet Prep: NONE SEEN
Yeast Wet Prep HPF POC: NONE SEEN

## 2011-07-22 ENCOUNTER — Ambulatory Visit (INDEPENDENT_AMBULATORY_CARE_PROVIDER_SITE_OTHER): Payer: Medicaid Other | Admitting: Pharmacist

## 2011-07-22 DIAGNOSIS — I2699 Other pulmonary embolism without acute cor pulmonale: Secondary | ICD-10-CM

## 2011-07-22 DIAGNOSIS — I82409 Acute embolism and thrombosis of unspecified deep veins of unspecified lower extremity: Secondary | ICD-10-CM

## 2011-07-22 DIAGNOSIS — Z86718 Personal history of other venous thrombosis and embolism: Secondary | ICD-10-CM

## 2011-07-22 DIAGNOSIS — D6859 Other primary thrombophilia: Secondary | ICD-10-CM

## 2011-07-22 DIAGNOSIS — Z7901 Long term (current) use of anticoagulants: Secondary | ICD-10-CM

## 2011-07-22 LAB — POCT INR: INR: 1.9

## 2011-07-22 NOTE — Progress Notes (Signed)
Anti-Coagulation Progress Note  Michele Gillespie is a 35 y.o. female who is currently on an anti-coagulation regimen.    RECENT RESULTS: Recent results are below, the most recent result is correlated with a dose of 115 mg. per week: Lab Results  Component Value Date   INR 1.90 07/22/2011   INR 3.0 06/10/2011   INR 1.50 05/13/2011    ANTI-COAG DOSE:   Latest dosing instructions   Total Sun Mon Tue Wed Thu Fri Sat   120 15 mg 20 mg 15 mg 20 mg 15 mg 20 mg 15 mg    (5 mg3) (5 mg4) (5 mg3) (5 mg4) (5 mg3) (5 mg4) (5 mg3)         ANTICOAG SUMMARY: Anticoagulation Episode Summary              Current INR goal 2.0-3.0 Next INR check 08/19/2011   INR from last check 1.90! (07/22/2011)     Weekly max dose (mg)  Target end date Indefinite   Indications Pulmonary embolism and infarction, Deep venous thrombosis of leg, Long term current use of anticoagulant   INR check location [facility] Preferred lab    Send INR reminders to ANT[facility]AG [facility]   Comments             ANTICOAG TODAY: Anticoagulation Summary as of 07/22/2011              INR goal 2.0-3.0     Selected INR 1.90! (07/22/2011) Next INR check 08/19/2011   Weekly max dose (mg)  Target end date Indefinite   Indications Pulmonary embolism and infarction, Deep venous thrombosis of leg, Long term current use of anticoagulant    Anticoagulation Episode Summary              INR check location [facility] Preferred lab    Send INR reminders to ANTICOAG [facility]   Comments             PATIENT INSTRUCTIONS: Patient Instructions  Patient instructed to take medications as defined in the Anti-coagulation Track section of this encounter.  Patient instructed to take today's dose.  Patient verbalized understanding of these instructions.        FOLLOW-UP Return in 4 weeks (on 08/19/2011) for Follow up INR.  Hulen Luster, III Pharm.D., CACP

## 2011-07-22 NOTE — Patient Instructions (Signed)
Patient instructed to take medications as defined in the Anti-coagulation Track section of this encounter.  Patient instructed to take today's dose.  Patient verbalized understanding of these instructions.    

## 2011-08-12 ENCOUNTER — Encounter: Payer: Medicaid Other | Admitting: Internal Medicine

## 2011-08-15 ENCOUNTER — Emergency Department (HOSPITAL_COMMUNITY)
Admission: EM | Admit: 2011-08-15 | Discharge: 2011-08-15 | Disposition: A | Discharge status: Home / Self Care | Payer: Medicaid Other | Attending: Emergency Medicine | Admitting: Emergency Medicine

## 2011-08-15 ENCOUNTER — Encounter (HOSPITAL_COMMUNITY): Payer: Self-pay | Admitting: Emergency Medicine

## 2011-08-15 ENCOUNTER — Emergency Department (HOSPITAL_COMMUNITY): Payer: Medicaid Other

## 2011-08-15 DIAGNOSIS — W2209XA Striking against other stationary object, initial encounter: Secondary | ICD-10-CM | POA: Insufficient documentation

## 2011-08-15 DIAGNOSIS — S91109A Unspecified open wound of unspecified toe(s) without damage to nail, initial encounter: Secondary | ICD-10-CM | POA: Insufficient documentation

## 2011-08-15 DIAGNOSIS — Z8659 Personal history of other mental and behavioral disorders: Secondary | ICD-10-CM | POA: Insufficient documentation

## 2011-08-15 DIAGNOSIS — J45909 Unspecified asthma, uncomplicated: Secondary | ICD-10-CM | POA: Insufficient documentation

## 2011-08-15 DIAGNOSIS — F329 Major depressive disorder, single episode, unspecified: Secondary | ICD-10-CM | POA: Insufficient documentation

## 2011-08-15 DIAGNOSIS — Z7901 Long term (current) use of anticoagulants: Secondary | ICD-10-CM | POA: Insufficient documentation

## 2011-08-15 DIAGNOSIS — M79609 Pain in unspecified limb: Secondary | ICD-10-CM | POA: Insufficient documentation

## 2011-08-15 DIAGNOSIS — IMO0002 Reserved for concepts with insufficient information to code with codable children: Secondary | ICD-10-CM

## 2011-08-15 DIAGNOSIS — S8990XA Unspecified injury of unspecified lower leg, initial encounter: Secondary | ICD-10-CM | POA: Insufficient documentation

## 2011-08-15 DIAGNOSIS — F3289 Other specified depressive episodes: Secondary | ICD-10-CM | POA: Insufficient documentation

## 2011-08-15 DIAGNOSIS — S92919A Unspecified fracture of unspecified toe(s), initial encounter for closed fracture: Secondary | ICD-10-CM | POA: Insufficient documentation

## 2011-08-15 DIAGNOSIS — Z86718 Personal history of other venous thrombosis and embolism: Secondary | ICD-10-CM | POA: Insufficient documentation

## 2011-08-15 DIAGNOSIS — S92911A Unspecified fracture of right toe(s), initial encounter for closed fracture: Secondary | ICD-10-CM

## 2011-08-15 DIAGNOSIS — S91119A Laceration without foreign body of unspecified toe without damage to nail, initial encounter: Secondary | ICD-10-CM

## 2011-08-15 MED ORDER — TETANUS-DIPHTH-ACELL PERTUSSIS 5-2.5-18.5 LF-MCG/0.5 IM SUSP
0.5000 mL | Freq: Once | INTRAMUSCULAR | Status: AC
  Administered 2011-08-15: 0.5 mL via INTRAMUSCULAR
  Filled 2011-08-15: qty 0.5

## 2011-08-15 MED ORDER — BUPIVACAINE HCL 0.25 % IJ SOLN
30.0000 mL | Freq: Once | INTRAMUSCULAR | Status: AC
  Administered 2011-08-15: 30 mL
  Filled 2011-08-15: qty 30

## 2011-08-15 MED ORDER — HYDROCODONE-ACETAMINOPHEN 5-325 MG PO TABS: 1.0000 | ORAL_TABLET | ORAL | Status: AC | PRN

## 2011-08-15 MED ORDER — CEPHALEXIN 500 MG PO CAPS: 500.0000 mg | ORAL_CAPSULE | Freq: Four times a day (QID) | ORAL | Status: AC

## 2011-08-15 MED ORDER — OXYCODONE-ACETAMINOPHEN 5-325 MG PO TABS
1.0000 | ORAL_TABLET | Freq: Once | ORAL | Status: AC
  Administered 2011-08-15: 1 via ORAL
  Filled 2011-08-15: qty 1

## 2011-08-15 MED ORDER — BUPIVACAINE HCL (PF) 0.5 % IJ SOLN
INTRAMUSCULAR | Status: AC
  Administered 2011-08-15: 06:00:00
  Filled 2011-08-15: qty 10

## 2011-08-15 NOTE — ED Notes (Signed)
Patient is resting comfortably in bed; no respiratory or acute distress noted.  Family present at bedside.  Patient currently denies pain; has no other questions, concerns, or requests at this time.  Will continue to monitor.

## 2011-08-15 NOTE — ED Notes (Signed)
Patient transported to x-ray; husband now at bedside.

## 2011-08-15 NOTE — ED Provider Notes (Signed)
History     CSN: [id number] Arrival date & time: 08/15/2011  4:13 AM   First MD Initiated Contact with Patient 08/15/11 0423      Chief Complaint  Patient presents with  . Foot Pain    (Consider location/radiation/quality/duration/timing/severity/associated sxs/prior treatment) HPI Patient with injury to right second toe this morning.  This was injured by a door.  She is on Coumadin for history of pulmonary embolism.  She reports some small amount of bleeding coming from this but also an underlying nail injury.  She presents with pain in that right second toe.  No other complaints at this time.  Pain is worsened by movement and palpation.  Pain is improved by nothing.  She's not tried anything for the pain.   Past Medical History  Diagnosis Date  . Pulmonary embolism  September 07, 2005     her CT angiogram - positive for pulmonary emboli to several branches of the right lower lobe- relatively small clot burden, clear lung; patient started on Coumadin; CT angiogram on January 10, 2006 showed resolution of previously seen pulmonary emboli with minimal basilar atelectasis  . Lung nodule  June 10, 2008     stable tiny noduke noted along the minor fissure of the right lung on CT angio September 11, 09 -  stable for 2 years and consistent with benign disease  . Arm DVT (deep venous thromboembolism), acute  September 23, 2009, January 05, 2010     Doppler study significant with indeterminant age DVT involving the left upper extremity, Doppler performed January 05, 2010 consistent with acute DVT involving the left upper extremity  . Asthma   . Anemia 2007     microcytic anemia, baseline hemoglobin 10-11, MCV at baseline 72-77, secondary to iron deficiency  . Schizophrenia   . Depression   . Leukopenia 2008     unclear etiology baseline WBC  2.8-3.7    Past Surgical History  Procedure Date  . Cesarean section      History of 5 C-section  . Tubal ligation   . Exploratory laparotomy with  abdominal mass excision 02/2005    Family History  Problem Relation Age of Onset  . Hypertension Mother   . Heart disease Mother   . Diabetes Father   . Birth defects Maternal Aunt   . Birth defects Maternal Uncle   . Diabetes Paternal Grandmother     History  Substance Use Topics  . Smoking status: Former Smoker    Types: Cigarettes    Quit date: 06/26/2010  . Smokeless tobacco: Not on file  . Alcohol Use: No    OB History    Grav Para Term Preterm Abortions TAB SAB Ect Mult Living   1               Review of Systems  All other systems reviewed and are negative.    Allergies  Review of patient's allergies indicates no known allergies.  Home Medications   Current Outpatient Rx  Name Route Sig Dispense Refill  . ALBUTEROL SULFATE HFA 108 (90 BASE) MCG/ACT IN AERS Inhalation Inhale 2 puffs into the lungs every 6 (six) hours as needed. For wheeze or shortness of breath    . FERROUS SULFATE 325 (65 FE) MG PO TABS Oral Take 325 mg by mouth daily with breakfast.      . WARFARIN SODIUM 5 MG PO TABS  Take as directed by anticoagulation clinic care provider. 120 tablet 2    BP 131/89  Pulse 100  Temp(Src) 98.2 F (36.8 C) (Oral)  Resp 18  SpO2 98%  LMP 08/12/2011  Physical Exam  Constitutional: She is oriented to person, place, and time. She appears well-developed and well-nourished.  HENT:  Head: Normocephalic.  Eyes: EOM are normal.  Neck: Normal range of motion.  Pulmonary/Chest: Effort normal.  Musculoskeletal: Normal range of motion.       Right second toe with complete nail loss and laceration to distal aspect of toe.  There is tenderness with range of motion and direct palpation of the distal phalanx of the right second toe.  There are no other toe or foot injury  Neurological: She is alert and oriented to person, place, and time.  Psychiatric: She has a normal mood and affect.    ED Course  LACERATION REPAIR Date/Time: 08/15/2011 5:58 AM Performed  by: Lyanne Co Authorized by: Lyanne Co Consent: Verbal consent obtained. Risks and benefits: risks, benefits and alternatives were discussed Consent given by: patient Required items: required blood products, implants, devices, and special equipment available Patient identity confirmed: arm band Time out: Immediately prior to procedure a "time out" was called to verify the correct patient, procedure, equipment, support staff and site/side marked as required. Body area: lower extremity Location details: right second toe Laceration length: 1 cm Tendon involvement: none Nerve involvement: none Vascular damage: no Anesthesia: digital block Local anesthetic: bupivacaine 0.5% with epinephrine Anesthetic total: 4 ml Preparation: Patient was prepped and draped in the usual sterile fashion. Irrigation solution: saline and tap water Irrigation method: syringe Amount of cleaning: extensive Debridement: minimal Degree of undermining: none Skin closure: 4-0 Prolene Number of sutures: 2 Technique: simple Approximation difficulty: simple Dressing: antibiotic ointment Comments: Complete loss of nail with normal underlying nail bed   NERVE BLOCK Date/Time: 08/15/2011 6:00 AM Performed by: Lyanne Co Authorized by: Lyanne Co Consent: Verbal consent obtained. Risks and benefits: risks, benefits and alternatives were discussed Consent given by: patient Indications comments: laceration Body area: lower extremity Nerve: digital Laterality: right Preparation: Patient was prepped and draped in the usual sterile fashion. Needle gauge: 24 G Location technique: anatomical landmarks Local anesthetic: bupivacaine 0.5% without epinephrine Anesthetic total: 4 ml Outcome: pain improved Patient tolerance: Patient tolerated the procedure well with no immediate complications.   (including critical care time)  Labs Reviewed - No data to display Dg Toe 2nd Right  08/15/2011   *RADIOLOGY REPORT*  Clinical Data: Right foot stuck under door; right second toe.  RIGHT TOE - 2+ VIEW  Comparison: None.  Findings: There is a comminuted fracture involving the distal tuft of the right second toe, with distal displacement.  Overlying soft tissue disruption is noted.  No additional fractures are seen.  Visualized joint spaces are preserved.  IMPRESSION: Comminuted fracture involving the distal tuft of the right second toe, with distal displacement and overlying soft tissue disruption.  Original Report Authenticated By: Tonia Ghent, M.D.   I personally reviewed the patient's x-ray.   1. Laceration of toe   2. Toe fracture, right   3. Nail bed injury       MDM  Patient with nail loss of his right second toe.  Laceration was repaired.  Infection warnings given.  She'll followup with his primary care physician in the ER for removal of sutures       Lyanne Co, MD 08/15/11 [phone]

## 2011-08-15 NOTE — ED Notes (Signed)
PT. REPORTS INJURY TO RIGHT 4TH TOE THIS MORNING - CAUGHT AGAINST A DOOR , PERSENTS WITH MODERATE BLEEDING .

## 2011-08-15 NOTE — ED Notes (Signed)
Bupivacaine pulled out of Pyxis for Dr. Patria Mane.  Suture cart at bedside.

## 2011-08-15 NOTE — ED Notes (Signed)
Right second toe wrapped with Vaseline guaze and curex dressing.

## 2011-08-15 NOTE — ED Notes (Signed)
Patient back from x-ray; currently sitting up in bed; no respiratory or acute distress noted.  Will continue to monitor.

## 2011-08-15 NOTE — ED Notes (Signed)
Secondary assessment: Patient states that she had a door close on her left foot around 0400 this morning.  Patient has shoe off; second toe covered in blood (no active bleeding).  Nail has been removed from nail bed.  Toes covered in gauze.  Patient complaining of pain; rates pain 8/10 on the numerical pain scale.  Describes pain as "throbbing" and "burning".  Patient alert and oriented x4; PERRL present.  Will continue to monitor.

## 2011-08-15 NOTE — ED Notes (Signed)
Dr. Campos at bedside   

## 2011-08-15 NOTE — ED Notes (Signed)
Patient given discharge paperwork.  Went over discharge instructions with patient.  Instructed patient to take Hydrocodone-acetaminophen, and Keflex as directed.  Instructed to follow up with a primary care physician and to return to the ED for worsening symptoms.

## 2011-08-15 NOTE — ED Notes (Signed)
Patient given socks.

## 2011-08-16 ENCOUNTER — Emergency Department (INDEPENDENT_AMBULATORY_CARE_PROVIDER_SITE_OTHER)
Admission: EM | Admit: 2011-08-16 | Discharge: 2011-08-16 | Disposition: A | Discharge status: Home / Self Care | Payer: Medicaid Other | Source: Home / Self Care | Attending: Family Medicine | Admitting: Family Medicine

## 2011-08-16 ENCOUNTER — Encounter (HOSPITAL_COMMUNITY): Payer: Self-pay | Admitting: *Deleted

## 2011-08-16 DIAGNOSIS — S99929A Unspecified injury of unspecified foot, initial encounter: Secondary | ICD-10-CM

## 2011-08-16 DIAGNOSIS — S8990XA Unspecified injury of unspecified lower leg, initial encounter: Secondary | ICD-10-CM

## 2011-08-16 NOTE — ED Notes (Signed)
Wound care -betadine soak  Right foot.Marland Kitchenapplied xeroform bulky dressing per Md instructions

## 2011-08-16 NOTE — ED Provider Notes (Addendum)
History     CSN: [id number] Arrival date & time: 08/16/2011  1:42 PM   First MD Initiated Contact with Patient 08/16/11 1317      Chief Complaint  Patient presents with  . Wound Check    (Consider location/radiation/quality/duration/timing/severity/associated sxs/prior treatment) Patient is a 35 y.o. female presenting with wound check.  Wound Check  She was treated in the ED today. Previous treatment in the ED includes laceration repair. There has been no treatment since the wound repair. There has been bloody discharge from the wound.    Past Medical History  Diagnosis Date  . Pulmonary embolism  September 07, 2005     her CT angiogram - positive for pulmonary emboli to several branches of the right lower lobe- relatively small clot burden, clear lung; patient started on Coumadin; CT angiogram on January 10, 2006 showed resolution of previously seen pulmonary emboli with minimal basilar atelectasis  . Lung nodule  June 10, 2008     stable tiny noduke noted along the minor fissure of the right lung on CT angio September 11, 09 -  stable for 2 years and consistent with benign disease  . Arm DVT (deep venous thromboembolism), acute  September 23, 2009, January 05, 2010     Doppler study significant with indeterminant age DVT involving the left upper extremity, Doppler performed January 05, 2010 consistent with acute DVT involving the left upper extremity  . Asthma   . Anemia 2007     microcytic anemia, baseline hemoglobin 10-11, MCV at baseline 72-77, secondary to iron deficiency  . Schizophrenia   . Depression   . Leukopenia 2008     unclear etiology baseline WBC  2.8-3.7    Past Surgical History  Procedure Date  . Cesarean section      History of 5 C-section  . Tubal ligation   . Exploratory laparotomy with abdominal mass excision 02/2005    Family History  Problem Relation Age of Onset  . Hypertension Mother   . Heart disease Mother   . Diabetes Father   . Birth defects  Maternal Aunt   . Birth defects Maternal Uncle   . Diabetes Paternal Grandmother     History  Substance Use Topics  . Smoking status: Former Smoker    Types: Cigarettes    Quit date: 06/26/2010  . Smokeless tobacco: Not on file  . Alcohol Use: No    OB History    Grav Para Term Preterm Abortions TAB SAB Ect Mult Living   1               Review of Systems  Constitutional: Negative.   Skin:       Min bleeding from injury site of right 2nd toe, suture intact, no infection    Allergies  Review of patient's allergies indicates no known allergies.  Home Medications   Current Outpatient Rx  Name Route Sig Dispense Refill  . ALBUTEROL SULFATE HFA 108 (90 BASE) MCG/ACT IN AERS Inhalation Inhale 2 puffs into the lungs every 6 (six) hours as needed. For wheeze or shortness of breath    . CEPHALEXIN 500 MG PO CAPS Oral Take 1 capsule (500 mg total) by mouth 4 (four) times daily. 28 capsule 0  . FERROUS SULFATE 325 (65 FE) MG PO TABS Oral Take 325 mg by mouth daily with breakfast.      . HYDROCODONE-ACETAMINOPHEN 5-325 MG PO TABS Oral Take 1 tablet by mouth every 4 (four) hours as needed for pain.  15 tablet 0  . WARFARIN SODIUM 5 MG PO TABS  Take as directed by anticoagulation clinic care provider. 120 tablet 2    BP 116/72  Pulse 76  Temp(Src) 98.2 F (36.8 C) (Oral)  Resp 16  SpO2 100%  LMP 08/12/2011  Physical Exam  Constitutional: She appears well-developed and well-nourished.  Musculoskeletal: She exhibits tenderness.  Skin: Skin is warm and dry.    ED Course  Procedures (including critical care time)  Labs Reviewed - No data to display Dg Toe 2nd Right  08/15/2011  *RADIOLOGY REPORT*  Clinical Data: Right foot stuck under door; right second toe.  RIGHT TOE - 2+ VIEW  Comparison: None.  Findings: There is a comminuted fracture involving the distal tuft of the right second toe, with distal displacement.  Overlying soft tissue disruption is noted.  No additional  fractures are seen.  Visualized joint spaces are preserved.  IMPRESSION: Comminuted fracture involving the distal tuft of the right second toe, with distal displacement and overlying soft tissue disruption.  Original Report Authenticated By: Tonia Ghent, M.D.     No diagnosis found.    MDM          Barkley Bruns, MD 08/16/11 1503  Barkley Bruns, MD 08/16/11 [phone]

## 2011-08-16 NOTE — ED Notes (Signed)
WOUND  CHECK   -  SEEN  ER  YEST     HAD  SUTURES  R  FOOT  TAKES  COUMADIN  CONTINUES  TO BLEED

## 2011-08-16 NOTE — ED Notes (Signed)
No  Active  Bleeding    noted

## 2011-08-19 ENCOUNTER — Ambulatory Visit: Payer: Medicaid Other

## 2011-08-27 ENCOUNTER — Emergency Department (INDEPENDENT_AMBULATORY_CARE_PROVIDER_SITE_OTHER)
Admission: EM | Admit: 2011-08-27 | Discharge: 2011-08-27 | Disposition: A | Discharge status: Home / Self Care | Payer: Medicaid Other | Source: Home / Self Care | Attending: Family Medicine | Admitting: Family Medicine

## 2011-08-27 DIAGNOSIS — S91109A Unspecified open wound of unspecified toe(s) without damage to nail, initial encounter: Secondary | ICD-10-CM

## 2011-08-27 DIAGNOSIS — S91119A Laceration without foreign body of unspecified toe without damage to nail, initial encounter: Secondary | ICD-10-CM

## 2011-08-27 NOTE — ED Provider Notes (Signed)
History     CSN: [id number] Arrival date & time: 08/27/2011  8:40 AM   First MD Initiated Contact with Patient 08/27/11 [phone]      No chief complaint on file.   (Consider location/radiation/quality/duration/timing/severity/associated sxs/prior treatment) Patient is a 35 y.o. female presenting with wound check.  Wound Check  She was treated in the ED 10 to 14 days ago. Previous treatment in the ED includes laceration repair. Treatments since wound repair include a wound recheck. There has been no drainage from the wound. There is no redness present. The pain has improved.    Past Medical History  Diagnosis Date  . Pulmonary embolism  September 07, 2005     her CT angiogram - positive for pulmonary emboli to several branches of the right lower lobe- relatively small clot burden, clear lung; patient started on Coumadin; CT angiogram on January 10, 2006 showed resolution of previously seen pulmonary emboli with minimal basilar atelectasis  . Lung nodule  June 10, 2008     stable tiny noduke noted along the minor fissure of the right lung on CT angio September 11, 09 -  stable for 2 years and consistent with benign disease  . Arm DVT (deep venous thromboembolism), acute  September 23, 2009, January 05, 2010     Doppler study significant with indeterminant age DVT involving the left upper extremity, Doppler performed January 05, 2010 consistent with acute DVT involving the left upper extremity  . Asthma   . Anemia 2007     microcytic anemia, baseline hemoglobin 10-11, MCV at baseline 72-77, secondary to iron deficiency  . Schizophrenia   . Depression   . Leukopenia 2008     unclear etiology baseline WBC  2.8-3.7    Past Surgical History  Procedure Date  . Cesarean section      History of 5 C-section  . Tubal ligation   . Exploratory laparotomy with abdominal mass excision 02/2005    Family History  Problem Relation Age of Onset  . Hypertension Mother   . Heart disease Mother   .  Diabetes Father   . Birth defects Maternal Aunt   . Birth defects Maternal Uncle   . Diabetes Paternal Grandmother     History  Substance Use Topics  . Smoking status: Former Smoker    Types: Cigarettes    Quit date: 06/26/2010  . Smokeless tobacco: Not on file  . Alcohol Use: No    OB History    Grav Para Term Preterm Abortions TAB SAB Ect Mult Living   1               Review of Systems  Constitutional: Negative.   Skin: Positive for wound.    Allergies  Review of patient's allergies indicates no known allergies.  Home Medications   Current Outpatient Rx  Name Route Sig Dispense Refill  . ALBUTEROL SULFATE HFA 108 (90 BASE) MCG/ACT IN AERS Inhalation Inhale 2 puffs into the lungs every 6 (six) hours as needed. For wheeze or shortness of breath    . FERROUS SULFATE 325 (65 FE) MG PO TABS Oral Take 325 mg by mouth daily with breakfast.      . WARFARIN SODIUM 5 MG PO TABS  Take as directed by anticoagulation clinic care provider. 120 tablet 2    BP 125/89  Pulse 79  Temp(Src) 98.4 F (36.9 C) (Oral)  Resp 16  SpO2 98%  LMP 08/12/2011  Physical Exam  Nursing note and vitals reviewed. Constitutional:  She appears well-developed and well-nourished.  Skin: Skin is warm and dry.       ED Course  Procedures (including critical care time)  Labs Reviewed - No data to display No results found.   No diagnosis found.    MDM  Suture removal        Barkley Bruns, MD 08/27/11 [phone]

## 2011-08-27 NOTE — ED Notes (Signed)
Here for suture removal from right foot. No sign of infection noted. Edges well approximated.

## 2011-09-09 ENCOUNTER — Ambulatory Visit (INDEPENDENT_AMBULATORY_CARE_PROVIDER_SITE_OTHER): Payer: Medicaid Other | Admitting: Pharmacist

## 2011-09-09 DIAGNOSIS — I82409 Acute embolism and thrombosis of unspecified deep veins of unspecified lower extremity: Secondary | ICD-10-CM

## 2011-09-09 DIAGNOSIS — I2699 Other pulmonary embolism without acute cor pulmonale: Secondary | ICD-10-CM

## 2011-09-09 DIAGNOSIS — Z86718 Personal history of other venous thrombosis and embolism: Secondary | ICD-10-CM

## 2011-09-09 DIAGNOSIS — Z7901 Long term (current) use of anticoagulants: Secondary | ICD-10-CM

## 2011-09-09 DIAGNOSIS — D6859 Other primary thrombophilia: Secondary | ICD-10-CM

## 2011-09-09 MED ORDER — WARFARIN SODIUM 5 MG PO TABS: ORAL_TABLET | ORAL | Status: DC

## 2011-09-09 NOTE — Patient Instructions (Signed)
Patient instructed to take medications as defined in the Anti-coagulation Track section of this encounter.  Patient instructed to take today's dose.  Patient verbalized understanding of these instructions.    

## 2011-09-09 NOTE — Progress Notes (Signed)
Anti-Coagulation Progress Note  Michele Gillespie is a 35 y.o. female who is currently on an anti-coagulation regimen.    RECENT RESULTS: Recent results are below, the most recent result is correlated with a dose of 120 mg. per week however, she has missed past 5 consecutive days accounting for her baseline INR: Lab Results  Component Value Date   INR 1.1 09/09/2011   INR 1.90 07/22/2011   INR 3.0 06/10/2011    ANTI-COAG DOSE:   Latest dosing instructions   Total Sun Mon Tue Wed Thu Fri Sat   120 15 mg 20 mg 15 mg 20 mg 15 mg 20 mg 15 mg    (5 mg3) (5 mg4) (5 mg3) (5 mg4) (5 mg3) (5 mg4) (5 mg3)         ANTICOAG SUMMARY: Anticoagulation Episode Summary              Current INR goal 2.0-3.0 Next INR check 10/07/2011   INR from last check 1.1! (09/09/2011)     Weekly max dose (mg)  Target end date Indefinite   Indications Pulmonary embolism and infarction, Deep venous thrombosis of leg, Long term current use of anticoagulant   [facility] location [facility] Pref[facility]    Send INR reminders to ANTICOAG [facility]   Comments             ANTICOAG TODAY: Anticoagulation Summary as of 09/09/2011              INR goal 2.0-3.0     Selected INR 1.1! (09/09/2011) Next INR check 10/07/2011   Weekly max dose (mg)  Target end date Indefinite   Indications Pulmonary embolism and infarction, Deep venous thrombosis of leg, Long term current use of anticoagulant    Anticoagulation Episode Summary              INR check location [facility] Preferred lab    Send INR reminders to ANTICOAG [facility]   Comments             PATIENT INSTRUCTIONS: Patient Instructions  Patient instructed to take medications as defined in the Anti-coagulation Track section of this encounter.  Patient instructed to take today's dose.  Patient verbalized understanding of these instructions.        FOLLOW-UP Return in 4 weeks (on 10/07/2011) for Follow up INR.  Hulen Luster, III Pharm.D., CACP

## 2011-10-03 ENCOUNTER — Telehealth: Payer: Self-pay | Admitting: *Deleted

## 2011-10-03 NOTE — Telephone Encounter (Signed)
Patient can take anything OTC that doesn't have ASA in it.

## 2011-10-03 NOTE — Telephone Encounter (Signed)
Pt called with c/o cold for 4 days.  She had headache for 2 days and tylenol does not help.  She is using delsym and mucinex which help. She wants to know what else she can take for headache and can she use sudafed.  She is on coumadin. Pt # [id number]

## 2011-10-03 NOTE — Telephone Encounter (Signed)
Pt informed

## 2011-10-04 ENCOUNTER — Encounter (HOSPITAL_COMMUNITY): Payer: Self-pay | Admitting: *Deleted

## 2011-10-04 ENCOUNTER — Emergency Department (HOSPITAL_COMMUNITY)
Admission: EM | Admit: 2011-10-04 | Discharge: 2011-10-04 | Disposition: A | Discharge status: Home / Self Care | Payer: Medicaid Other | Attending: Emergency Medicine | Admitting: Emergency Medicine

## 2011-10-04 DIAGNOSIS — R6889 Other general symptoms and signs: Secondary | ICD-10-CM | POA: Insufficient documentation

## 2011-10-04 DIAGNOSIS — Z86718 Personal history of other venous thrombosis and embolism: Secondary | ICD-10-CM | POA: Insufficient documentation

## 2011-10-04 DIAGNOSIS — Z79899 Other long term (current) drug therapy: Secondary | ICD-10-CM | POA: Insufficient documentation

## 2011-10-04 DIAGNOSIS — Z7901 Long term (current) use of anticoagulants: Secondary | ICD-10-CM | POA: Insufficient documentation

## 2011-10-04 DIAGNOSIS — R0989 Other specified symptoms and signs involving the circulatory and respiratory systems: Secondary | ICD-10-CM | POA: Insufficient documentation

## 2011-10-04 DIAGNOSIS — F3289 Other specified depressive episodes: Secondary | ICD-10-CM | POA: Insufficient documentation

## 2011-10-04 DIAGNOSIS — F329 Major depressive disorder, single episode, unspecified: Secondary | ICD-10-CM | POA: Insufficient documentation

## 2011-10-04 DIAGNOSIS — J069 Acute upper respiratory infection, unspecified: Secondary | ICD-10-CM

## 2011-10-04 DIAGNOSIS — R51 Headache: Secondary | ICD-10-CM | POA: Insufficient documentation

## 2011-10-04 DIAGNOSIS — R05 Cough: Secondary | ICD-10-CM | POA: Insufficient documentation

## 2011-10-04 DIAGNOSIS — J3489 Other specified disorders of nose and nasal sinuses: Secondary | ICD-10-CM | POA: Insufficient documentation

## 2011-10-04 DIAGNOSIS — Z86711 Personal history of pulmonary embolism: Secondary | ICD-10-CM | POA: Insufficient documentation

## 2011-10-04 DIAGNOSIS — F17200 Nicotine dependence, unspecified, uncomplicated: Secondary | ICD-10-CM | POA: Insufficient documentation

## 2011-10-04 DIAGNOSIS — R059 Cough, unspecified: Secondary | ICD-10-CM | POA: Insufficient documentation

## 2011-10-04 DIAGNOSIS — J45909 Unspecified asthma, uncomplicated: Secondary | ICD-10-CM | POA: Insufficient documentation

## 2011-10-04 MED ORDER — ALBUTEROL SULFATE (5 MG/ML) 0.5% IN NEBU
INHALATION_SOLUTION | RESPIRATORY_TRACT | Status: AC
  Administered 2011-10-04: 5 mg via RESPIRATORY_TRACT
  Filled 2011-10-04: qty 1

## 2011-10-04 MED ORDER — NICOTINE 21 MG/24HR TD PT24: 1.0000 | MEDICATED_PATCH | TRANSDERMAL | Status: AC

## 2011-10-04 MED ORDER — HYDROCOD POLST-CHLORPHEN POLST 10-8 MG/5ML PO LQCR
5.0000 mL | Freq: Once | ORAL | Status: AC
  Administered 2011-10-04: 5 mL via ORAL
  Filled 2011-10-04: qty 5

## 2011-10-04 MED ORDER — HYDROCOD POLST-CHLORPHEN POLST 10-8 MG/5ML PO LQCR: 10.0000 mL | Freq: Two times a day (BID) | ORAL | Status: DC | PRN

## 2011-10-04 NOTE — ED Notes (Signed)
Reports cold symptoms since Saturday, having cough, headache, congestion.

## 2011-10-04 NOTE — ED Provider Notes (Signed)
History     CSN: [id number]  Arrival date & time 10/04/11  4098   First MD Initiated Contact with Patient 10/04/11 1023      Chief Complaint  Patient presents with  . Cough  . URI    (Consider location/radiation/quality/duration/timing/severity/associated sxs/prior treatment) HPI  36 year old female presenting to the ED with cold symptoms. Patient states the past which has been having headache, sneezing, coughing, nasal congestions, chest congestion. The symptoms worsen with smoking. Patient states she just recently started smoking about 6-7 months ago. Patient is related smoking as coping mechanism due to family members recently passed away.  Pt denies fever but endorse chills. She did vomited a few days ago from coughing.  Able to tolerates PO.  Has tried OTC medications without relief.  Has hx of asthma.  Denies rash.  Past Medical History  Diagnosis Date  . Pulmonary embolism  September 07, 2005     her CT angiogram - positive for pulmonary emboli to several branches of the right lower lobe- relatively small clot burden, clear lung; patient started on Coumadin; CT angiogram on January 10, 2006 showed resolution of previously seen pulmonary emboli with minimal basilar atelectasis  . Lung nodule  June 10, 2008     stable tiny noduke noted along the minor fissure of the right lung on CT angio September 11, 09 -  stable for 2 years and consistent with benign disease  . Arm DVT (deep venous thromboembolism), acute  September 23, 2009, January 05, 2010     Doppler study significant with indeterminant age DVT involving the left upper extremity, Doppler performed January 05, 2010 consistent with acute DVT involving the left upper extremity  . Asthma   . Anemia 2007     microcytic anemia, baseline hemoglobin 10-11, MCV at baseline 72-77, secondary to iron deficiency  . Schizophrenia   . Depression   . Leukopenia 2008     unclear etiology baseline WBC  2.8-3.7    Past Surgical History    Procedure Date  . Cesarean section      History of 5 C-section  . Tubal ligation   . Exploratory laparotomy with abdominal mass excision 02/2005    Family History  Problem Relation Age of Onset  . Hypertension Mother   . Heart disease Mother   . Diabetes Father   . Birth defects Maternal Aunt   . Birth defects Maternal Uncle   . Diabetes Paternal Grandmother     History  Substance Use Topics  . Smoking status: Current Everyday Smoker    Types: Cigarettes    Last Attempt to Quit: 06/26/2010  . Smokeless tobacco: Not on file  . Alcohol Use: Yes     occ beer    OB History    Grav Para Term Preterm Abortions TAB SAB Ect Mult Living   1               Review of Systems  All other systems reviewed and are negative.    Allergies  Review of patient's allergies indicates no known allergies.  Home Medications   Current Outpatient Rx  Name Route Sig Dispense Refill  . ALBUTEROL SULFATE HFA 108 (90 BASE) MCG/ACT IN AERS Inhalation Inhale 2 puffs into the lungs every 6 (six) hours as needed. For wheeze or shortness of breath    . FERROUS SULFATE 325 (65 FE) MG PO TABS Oral Take 325 mg by mouth daily with breakfast.      . NICOTINE 21 MG/24HR  TD PT24 Transdermal Place 1 patch onto the skin daily.      . WARFARIN SODIUM 5 MG PO TABS Oral Take 15-20 mg by mouth daily. Takes 20mg  mon, wed, fri and 15mg  all other days       BP 127/87  Temp(Src) 98.7 F (37.1 C) (Oral)  Resp 18  SpO2 97%  LMP 10/01/2011  Physical Exam  Nursing note and vitals reviewed. Constitutional: She appears well-developed and well-nourished. No distress.       Awake, alert, nontoxic appearance  HENT:  Head: Normocephalic and atraumatic.  Right Ear: External ear normal.  Left Ear: External ear normal.  Mouth/Throat: Oropharynx is clear and moist. No oropharyngeal exudate.  Eyes: Conjunctivae are normal. Right eye exhibits no discharge. Left eye exhibits no discharge.  Neck: Neck supple.   Cardiovascular: Normal rate and regular rhythm.   Pulmonary/Chest: Effort normal. No respiratory distress. She has wheezes. She has no rales. She exhibits no tenderness.  Abdominal: Soft. There is no tenderness. There is no rebound.  Musculoskeletal: She exhibits no tenderness.       Baseline ROM, no obvious new focal weakness  Lymphadenopathy:    She has no cervical adenopathy.  Neurological: She is alert.       Mental status and motor strength appears baseline for patient and situation  Skin: Skin is warm and dry. No rash noted.  Psychiatric: She has a normal mood and affect.    ED Course  Procedures (including critical care time)  Labs Reviewed - No data to display No results found.   No diagnosis found.    MDM  Pt has URI sxs.  Wheezes and Rhonchi noted on lung exam.  Pt is afebrile, and not likely have pna.   Smoking cessation discussed. Pt request nicotine patch.        Fayrene Helper, PA 10/04/11 1155

## 2011-10-04 NOTE — ED Notes (Signed)
Pt. reports losing both her mother and sister recently.  Pt. Started smoking about 7 months ago and for the past 2 weeks has had a "really bad cold."  Pt. Has been coughing up phlegm but reports no n/v/d or fevers.  Pt. Is also smoking constantly after she removes her nicotine patches.  Pt. reports being stressed and having headaches.

## 2011-10-04 NOTE — ED Provider Notes (Signed)
Medical screening examination/treatment/procedure(s) were performed by non-physician practitioner and as supervising physician I was immediately available for consultation/collaboration. Gabino Hagin Y.   Gavin Pound. Raynold Blankenbaker, MD 10/04/11 1541

## 2011-10-07 ENCOUNTER — Ambulatory Visit: Payer: Medicaid Other

## 2011-11-04 ENCOUNTER — Encounter: Payer: Medicaid Other | Admitting: Internal Medicine

## 2011-11-29 ENCOUNTER — Emergency Department (HOSPITAL_COMMUNITY): Payer: Medicaid Other

## 2011-11-29 ENCOUNTER — Encounter (HOSPITAL_COMMUNITY): Payer: Self-pay | Admitting: *Deleted

## 2011-11-29 ENCOUNTER — Emergency Department (HOSPITAL_COMMUNITY)
Admission: EM | Admit: 2011-11-29 | Discharge: 2011-11-29 | Disposition: A | Discharge status: Home Health | Payer: Medicaid Other | Attending: Emergency Medicine | Admitting: Emergency Medicine

## 2011-11-29 DIAGNOSIS — F17200 Nicotine dependence, unspecified, uncomplicated: Secondary | ICD-10-CM | POA: Insufficient documentation

## 2011-11-29 DIAGNOSIS — J3489 Other specified disorders of nose and nasal sinuses: Secondary | ICD-10-CM | POA: Insufficient documentation

## 2011-11-29 DIAGNOSIS — F329 Major depressive disorder, single episode, unspecified: Secondary | ICD-10-CM | POA: Insufficient documentation

## 2011-11-29 DIAGNOSIS — R05 Cough: Secondary | ICD-10-CM | POA: Insufficient documentation

## 2011-11-29 DIAGNOSIS — J40 Bronchitis, not specified as acute or chronic: Secondary | ICD-10-CM | POA: Insufficient documentation

## 2011-11-29 DIAGNOSIS — R0602 Shortness of breath: Secondary | ICD-10-CM | POA: Insufficient documentation

## 2011-11-29 DIAGNOSIS — Z86711 Personal history of pulmonary embolism: Secondary | ICD-10-CM | POA: Insufficient documentation

## 2011-11-29 DIAGNOSIS — R071 Chest pain on breathing: Secondary | ICD-10-CM | POA: Insufficient documentation

## 2011-11-29 DIAGNOSIS — Z7901 Long term (current) use of anticoagulants: Secondary | ICD-10-CM | POA: Insufficient documentation

## 2011-11-29 DIAGNOSIS — R059 Cough, unspecified: Secondary | ICD-10-CM | POA: Insufficient documentation

## 2011-11-29 DIAGNOSIS — J45909 Unspecified asthma, uncomplicated: Secondary | ICD-10-CM | POA: Insufficient documentation

## 2011-11-29 DIAGNOSIS — Z8659 Personal history of other mental and behavioral disorders: Secondary | ICD-10-CM | POA: Insufficient documentation

## 2011-11-29 DIAGNOSIS — F3289 Other specified depressive episodes: Secondary | ICD-10-CM | POA: Insufficient documentation

## 2011-11-29 LAB — PROTIME-INR: INR: 2.96 — ABNORMAL HIGH (ref 0.00–1.49)

## 2011-11-29 LAB — CBC
Hemoglobin: 11.7 g/dL — ABNORMAL LOW (ref 12.0–15.0)
MCH: 23 pg — ABNORMAL LOW (ref 26.0–34.0)
RBC: 5.09 MIL/uL (ref 3.87–5.11)

## 2011-11-29 LAB — TROPONIN I: Troponin I: <0.3 ng/mL (ref <0.30)

## 2011-11-29 LAB — BASIC METABOLIC PANEL
CO2: 25 mEq/L (ref 19–32)
Chloride: 101 mEq/L (ref 96–112)
Creatinine, Ser: 0.8 mg/dL (ref 0.50–1.10)
Sodium: 137 mEq/L (ref 135–145)

## 2011-11-29 LAB — DIFFERENTIAL
Eosinophils Absolute: 0 10*3/uL (ref 0.0–0.7)
Lymphs Abs: 1.6 10*3/uL (ref 0.7–4.0)
Monocytes Relative: 6 % (ref 3–12)
Neutrophils Relative %: 50 % (ref 43–77)

## 2011-11-29 MED ORDER — ALBUTEROL SULFATE (5 MG/ML) 0.5% IN NEBU
2.5000 mg | INHALATION_SOLUTION | Freq: Once | RESPIRATORY_TRACT | Status: AC
  Administered 2011-11-29: 2.5 mg via RESPIRATORY_TRACT
  Filled 2011-11-29: qty 0.5

## 2011-11-29 MED ORDER — OXYCODONE-ACETAMINOPHEN 5-325 MG PO TABS
2.0000 | ORAL_TABLET | Freq: Once | ORAL | Status: AC
  Administered 2011-11-29: 2 via ORAL
  Filled 2011-11-29: qty 2

## 2011-11-29 MED ORDER — KETOROLAC TROMETHAMINE 30 MG/ML IJ SOLN
30.0000 mg | Freq: Once | INTRAMUSCULAR | Status: AC
  Administered 2011-11-29: 30 mg via INTRAVENOUS
  Filled 2011-11-29: qty 1

## 2011-11-29 MED ORDER — AZITHROMYCIN 250 MG PO TABS: 250.0000 mg | ORAL_TABLET | Freq: Every day | ORAL | Status: AC

## 2011-11-29 MED ORDER — AZITHROMYCIN 250 MG PO TABS
500.0000 mg | ORAL_TABLET | Freq: Once | ORAL | Status: AC
  Administered 2011-11-29: 500 mg via ORAL
  Filled 2011-11-29: qty 2

## 2011-11-29 MED ORDER — IOHEXOL 350 MG/ML SOLN
100.0000 mL | Freq: Once | INTRAVENOUS | Status: AC | PRN
  Administered 2011-11-29: 100 mL via INTRAVENOUS

## 2011-11-29 NOTE — ED Provider Notes (Signed)
History     CSN: [id number]  Arrival date & time 11/29/11  1130   First MD Initiated Contact with Patient 11/29/11 1154      Chief Complaint  Patient presents with  . Cough  . Chest Pain    (Consider location/radiation/quality/duration/timing/severity/associated sxs/prior treatment) HPI Comments: Patient presents with three-day history of substernal chest pain associated with dry nonproductive cough. She has a history of asthma and is a former smoker. She denies using her inhaler at home. Her breathing worsens with exertion and she feels she is severe pain taking a deep breath. She does have a history of pulmonary embolism and is on Coumadin. She admits that she misses doses of her Coumadin sometimes. She denies any fever, abdominal pain, nausea vomiting. No leg pain or swelling.  The history is provided by the patient.    Past Medical History  Diagnosis Date  . Pulmonary embolism  September 07, 2005     her CT angiogram - positive for pulmonary emboli to several branches of the right lower lobe- relatively small clot burden, clear lung; patient started on Coumadin; CT angiogram on January 10, 2006 showed resolution of previously seen pulmonary emboli with minimal basilar atelectasis  . Lung nodule  June 10, 2008     stable tiny noduke noted along the minor fissure of the right lung on CT angio September 11, 09 -  stable for 2 years and consistent with benign disease  . Arm DVT (deep venous thromboembolism), acute  September 23, 2009, January 05, 2010     Doppler study significant with indeterminant age DVT involving the left upper extremity, Doppler performed January 05, 2010 consistent with acute DVT involving the left upper extremity  . Asthma   . Anemia 2007     microcytic anemia, baseline hemoglobin 10-11, MCV at baseline 72-77, secondary to iron deficiency  . Schizophrenia   . Depression   . Leukopenia 2008     unclear etiology baseline WBC  2.8-3.7    Past Surgical History    Procedure Date  . Cesarean section      History of 5 C-section  . Tubal ligation   . Exploratory laparotomy with abdominal mass excision 02/2005    Family History  Problem Relation Age of Onset  . Hypertension Mother   . Heart disease Mother   . Diabetes Father   . Birth defects Maternal Aunt   . Birth defects Maternal Uncle   . Diabetes Paternal Grandmother     History  Substance Use Topics  . Smoking status: Current Everyday Smoker    Types: Cigarettes    Last Attempt to Quit: 06/26/2010  . Smokeless tobacco: Not on file  . Alcohol Use: Yes     occ beer    OB History    Grav Para Term Preterm Abortions TAB SAB Ect Mult Living   1               Review of Systems  Constitutional: Negative for fever, activity change and appetite change.  HENT: Positive for congestion and rhinorrhea. Negative for sore throat.   Respiratory: Positive for cough, chest tightness and shortness of breath.   Cardiovascular: Positive for chest pain. Negative for leg swelling.  Gastrointestinal: Negative for nausea, vomiting and abdominal pain.  Genitourinary: Negative for dysuria, hematuria, vaginal bleeding and vaginal discharge.  Musculoskeletal: Negative for back pain.  Skin: Negative for rash.  Neurological: Negative for weakness and headaches.    Allergies  Review of patient's allergies  indicates no known allergies.  Home Medications   Current Outpatient Rx  Name Route Sig Dispense Refill  . ALBUTEROL SULFATE HFA 108 (90 BASE) MCG/ACT IN AERS Inhalation Inhale 2 puffs into the lungs every 6 (six) hours as needed. For wheeze or shortness of breath    . NICOTINE 21 MG/24HR TD PT24 Transdermal Place 1 patch onto the skin daily.      . WARFARIN SODIUM 5 MG PO TABS Oral Take 15-20 mg by mouth daily. Takes 20mg  mon, wed, fri and 15mg  all other days    . AZITHROMYCIN 250 MG PO TABS Oral Take 1 tablet (250 mg total) by mouth daily. Take 1 tablet every day until finished. 4 tablet 0     BP 132/94  Pulse 74  Temp(Src) 97.7 F (36.5 C) (Oral)  Resp 18  SpO2 98%  LMP 11/20/2011  Physical Exam  Constitutional: She is oriented to person, place, and time. She appears well-developed and well-nourished. No distress.  HENT:  Head: Normocephalic and atraumatic.  Mouth/Throat: Oropharynx is clear and moist. No oropharyngeal exudate.  Eyes: Conjunctivae are normal. Pupils are equal, round, and reactive to light.  Neck: Normal range of motion. Neck supple.  Cardiovascular: Normal rate, regular rhythm and normal heart sounds.   No murmur heard. Pulmonary/Chest: Effort normal and breath sounds normal. No respiratory distress. She exhibits tenderness.       Reproducible anterior chest wall tenderness  Abdominal: Soft. There is no tenderness. There is no rebound and no guarding.  Musculoskeletal: Normal range of motion. She exhibits no edema and no tenderness.       No leg pain or swelling  Neurological: She is alert and oriented to person, place, and time. No cranial nerve deficit.  Skin: Skin is warm.    ED Course  Procedures (including critical care time)  Labs Reviewed  CBC - Abnormal; Notable for the following:    WBC 3.8 (*)    Hemoglobin 11.7 (*)    MCV 71.3 (*)    MCH 23.0 (*)    RDW 16.2 (*)    All other components within normal limits  PROTIME-INR - Abnormal; Notable for the following:    Prothrombin Time 31.3 (*)    INR 2.96 (*)    All other components within normal limits  DIFFERENTIAL  BASIC METABOLIC PANEL  TROPONIN I   Dg Chest 2 View  11/29/2011  *RADIOLOGY REPORT*  Clinical Data: Chest pain, cough, shortness of breath.  CHEST - 2 VIEW  Comparison: 10/04/2009  Findings: Heart and mediastinal contours are within normal limits. No focal opacities or effusions.  No acute bony abnormality.  IMPRESSION: No active cardiopulmonary disease.  Original Report Authenticated By: Cyndie Chime, M.D.   Ct Angio Chest W/cm &/or Wo Cm  11/29/2011  *RADIOLOGY  REPORT*  Clinical Data: Chest pain  CT ANGIOGRAPHY CHEST  Technique:  Multidetector CT imaging of the chest using the standard protocol during bolus administration of intravenous contrast. Multiplanar reconstructed images including MIPs were obtained and reviewed to evaluate the vascular anatomy.  Contrast: OMNIPAQUE IOHEXOL 350 MG/ML IV SOLN  Comparison: 09/23/2009  Findings: There are no filling defects in the pulmonary arterial tree to suggest acute pulmonary thromboembolism.  Negative abnormal mediastinal adenopathy.  Negative pericardial effusion.  No destructive bone lesion.  Clear lungs.  IMPRESSION: No evidence of acute pulmonary thromboembolism.  Original Report Authenticated By: Donavan Burnet, M.D.     1. Bronchitis       MDM  Cough, chest pain, shortness of breath. Vitals stable. Pleuritic chest pain with history of PE. Poor compliance with Coumadin.  Will need evaluation for pulmonary embolism.   Date: 11/29/2011  Rate: 77  Rhythm: normal sinus rhythm  QRS Axis: normal  Intervals: normal  ST/T Wave abnormalities: nonspecific ST/T changes  Conduction Disutrbances:none  Narrative Interpretation:   Old EKG Reviewed: unchanged        Glynn Octave, MD 11/29/11 2135

## 2011-11-29 NOTE — Discharge Instructions (Signed)

## 2011-11-29 NOTE — ED Notes (Signed)
Pt reports on Tuesday while watching TV she developed sharp midsternal chest pain. Pt states her chest pain has been increasing over the last several days and now is located on the left side of her chest. Pt denies SOb, hx of asthma, denies home inhaler use. Denies radiation of pain. Pt states she vomited x1 yesterday.

## 2011-11-29 NOTE — ED Provider Notes (Signed)
Medical screening examination/treatment/procedure(s) were conducted as a shared visit with non-physician practitioner(s) and myself.  I personally evaluated the patient during the encounter   Michele Octave, MD 11/29/11 2131

## 2011-11-29 NOTE — ED Provider Notes (Signed)
Patient moved to CDU pending completion of diagnostic testing in the evaluation of pleuritic chest pain.  History of cough x 2 weeks, developed pleuritic pain 2 days ago.  Patient resting comfortably at present with family at bedside.  Radiology results reviewed, discussed with patient and with Dr. Manus Gunning.  No evidence of pulmonary embolism.  Suspect bronchitis.  Patient has albuterol MDI with spacer at home. Will add zithromax to treatment regimen.  Jimmye Norman, NP 11/29/11 [phone]

## 2011-11-29 NOTE — ED Notes (Signed)
Pt reports 3 day hx of center to left sided chest pain. Denies any associated symptoms. States she has been treated for cold like symptoms. Denies productive cough or fever.

## 2012-01-06 ENCOUNTER — Encounter (HOSPITAL_COMMUNITY): Payer: Self-pay | Admitting: *Deleted

## 2012-01-06 ENCOUNTER — Emergency Department (HOSPITAL_COMMUNITY)
Admission: EM | Admit: 2012-01-06 | Discharge: 2012-01-07 | Discharge status: Against Medical Advice | Payer: Medicaid Other | Attending: Emergency Medicine | Admitting: Emergency Medicine

## 2012-01-06 DIAGNOSIS — R141 Gas pain: Secondary | ICD-10-CM | POA: Insufficient documentation

## 2012-01-06 DIAGNOSIS — R109 Unspecified abdominal pain: Secondary | ICD-10-CM | POA: Insufficient documentation

## 2012-01-06 DIAGNOSIS — R142 Eructation: Secondary | ICD-10-CM | POA: Insufficient documentation

## 2012-01-06 LAB — URINE MICROSCOPIC-ADD ON

## 2012-01-06 LAB — URINALYSIS, ROUTINE W REFLEX MICROSCOPIC
Leukocytes, UA: NEGATIVE
Nitrite: NEGATIVE
Specific Gravity, Urine: 1.027 (ref 1.005–1.030)
pH: 6 (ref 5.0–8.0)

## 2012-01-06 NOTE — ED Notes (Signed)
The pt has had abd pain for one month with a lot of gas and her bms are irregular.  lmp last month

## 2012-01-06 NOTE — ED Notes (Signed)
Patient signed out AMA

## 2012-03-16 ENCOUNTER — Ambulatory Visit (INDEPENDENT_AMBULATORY_CARE_PROVIDER_SITE_OTHER): Payer: Medicaid Other | Admitting: Pharmacist

## 2012-03-16 DIAGNOSIS — I82409 Acute embolism and thrombosis of unspecified deep veins of unspecified lower extremity: Secondary | ICD-10-CM

## 2012-03-16 DIAGNOSIS — Z7901 Long term (current) use of anticoagulants: Secondary | ICD-10-CM

## 2012-03-16 LAB — POCT INR: INR: 2.3

## 2012-03-16 NOTE — Progress Notes (Signed)
Anti-Coagulation Progress Note  Michele Gillespie is a 36 y.o. female who is currently on an anti-coagulation regimen.    RECENT RESULTS: Recent results are below, the most recent result is correlated with a dose of 120 mg. per week: Lab Results  Component Value Date   INR 2.30 03/16/2012   INR 2.96* 11/29/2011   INR 1.1 09/09/2011    ANTI-COAG DOSE:   Latest dosing instructions   Total Sun Mon Tue Wed Thu Fri Sat   120 15 mg 20 mg 15 mg 20 mg 15 mg 20 mg 15 mg    (5 mg3) (5 mg4) (5 mg3) (5 mg4) (5 mg3) (5 mg4) (5 mg3)         ANTICOAG SUMMARY: Anticoagulation Episode Summary              Current INR goal 2.0-3.0 Next INR check 04/06/2012   INR from last check 2.30 (03/16/2012)     Weekly max dose (mg)  Target end date Indefinite   Indications Pulmonary embolism and infarction, Deep venous thrombosis of leg, Long term current use of anticoagulant   INR check location [facility] Preferred lab    Send INR reminders to ANTICOAG IMP   Comments             ANTICOAG TODAY: Anticoagulation Summary as of 03/16/2012              INR goal 2.0-3.0     Selected INR 2.30 (03/16/2012) Next INR check 04/06/2012   Weekly max dose (mg)  Target end date Indefinite   Indications Pulmonary embolism and infarction, Deep venous thrombosis of leg, Long term current use of anticoagulant    Anticoagulation Episode Summary              INR check location [facility] Preferred lab    Send INR reminders to ANTICOAG IMP   Comments             PATIENT INSTRUCTIONS: Patient Instructions  Patient instructed to take medications as defined in the Anti-coagulation Track section of this encounter.  Patient instructed to take today's dose.  Patient verbalized understanding of these instructions.        FOLLOW-UP Return in 3 weeks (on 04/06/2012) for Follow up INR at 1030h.  Hulen Luster, III Pharm.D., CACP

## 2012-03-16 NOTE — Patient Instructions (Signed)
Patient instructed to take medications as defined in the Anti-coagulation Track section of this encounter.  Patient instructed to take today's dose.  Patient verbalized understanding of these instructions.    

## 2012-03-16 NOTE — Progress Notes (Signed)
Last seen 4/12. Seen at [facility] 2012. I am request in records and requesting that pt make and keep appt with [facility].

## 2012-03-17 ENCOUNTER — Emergency Department (HOSPITAL_COMMUNITY): Payer: Medicaid Other

## 2012-03-17 ENCOUNTER — Encounter (HOSPITAL_COMMUNITY): Payer: Self-pay | Admitting: Emergency Medicine

## 2012-03-17 ENCOUNTER — Emergency Department (HOSPITAL_COMMUNITY)
Admission: EM | Admit: 2012-03-17 | Discharge: 2012-03-17 | Disposition: A | Discharge status: Home / Self Care | Payer: Medicaid Other | Attending: Emergency Medicine | Admitting: Emergency Medicine

## 2012-03-17 DIAGNOSIS — M549 Dorsalgia, unspecified: Secondary | ICD-10-CM

## 2012-03-17 DIAGNOSIS — M545 Low back pain, unspecified: Secondary | ICD-10-CM | POA: Insufficient documentation

## 2012-03-17 DIAGNOSIS — F17200 Nicotine dependence, unspecified, uncomplicated: Secondary | ICD-10-CM | POA: Insufficient documentation

## 2012-03-17 DIAGNOSIS — F3289 Other specified depressive episodes: Secondary | ICD-10-CM | POA: Insufficient documentation

## 2012-03-17 DIAGNOSIS — Z86718 Personal history of other venous thrombosis and embolism: Secondary | ICD-10-CM | POA: Insufficient documentation

## 2012-03-17 DIAGNOSIS — F209 Schizophrenia, unspecified: Secondary | ICD-10-CM | POA: Insufficient documentation

## 2012-03-17 DIAGNOSIS — Z86711 Personal history of pulmonary embolism: Secondary | ICD-10-CM | POA: Insufficient documentation

## 2012-03-17 DIAGNOSIS — J45909 Unspecified asthma, uncomplicated: Secondary | ICD-10-CM | POA: Insufficient documentation

## 2012-03-17 DIAGNOSIS — Z7901 Long term (current) use of anticoagulants: Secondary | ICD-10-CM | POA: Insufficient documentation

## 2012-03-17 DIAGNOSIS — F329 Major depressive disorder, single episode, unspecified: Secondary | ICD-10-CM | POA: Insufficient documentation

## 2012-03-17 MED ORDER — OXYCODONE-ACETAMINOPHEN 5-325 MG PO TABS
1.0000 | ORAL_TABLET | Freq: Once | ORAL | Status: AC
  Administered 2012-03-17: 1 via ORAL
  Filled 2012-03-17: qty 1

## 2012-03-17 MED ORDER — HYDROCODONE-ACETAMINOPHEN 5-325 MG PO TABS: ORAL_TABLET | ORAL | Status: AC

## 2012-03-17 MED ORDER — METHOCARBAMOL 500 MG PO TABS: 1000.0000 mg | ORAL_TABLET | Freq: Four times a day (QID) | ORAL | Status: AC

## 2012-03-17 NOTE — ED Notes (Signed)
Pt. Stated, I'm having back pain from a fall a month ago.

## 2012-03-17 NOTE — ED Provider Notes (Signed)
Medical screening examination/treatment/procedure(s) were conducted as a shared visit with non-physician practitioner(s) and myself.  I personally evaluated the patient during the encounter Fell approx. 1 mo.chronic back pain since. Radiates into leg no weakness. No b/b incontinence. No uti sxs.  No neuro deficits.  xr no fx or dislocation.  Will release to home.   Cheri Guppy, MD 03/21/12 1504

## 2012-03-17 NOTE — ED Provider Notes (Signed)
History     CSN: [id number]  Arrival date & time 03/17/12  4098   First MD Initiated Contact with Patient 03/17/12 1034      Chief Complaint  Patient presents with  . Back Pain    (Consider location/radiation/quality/duration/timing/severity/associated sxs/prior treatment) HPI Comments: Patient fell onto her back one month ago, she has had worsening intermittent L sided lower back pain with radiation into L leg. No red flag s/s of lower back pain. On coumadin for h/o PE. She has taken tylenol without relief. Onset acute.   Patient is a 36 y.o. female presenting with back pain. The history is provided by the patient.  Back Pain  This is a new problem. The current episode started more than 1 week ago. The problem occurs constantly. The problem has been gradually worsening. The pain is associated with falling. The pain is present in the lumbar spine. The quality of the pain is described as shooting and aching. The pain radiates to the left thigh. The pain is moderate. The symptoms are aggravated by bending, certain positions and twisting. Pertinent negatives include no fever, no numbness, no weight loss, no abdominal pain, no bowel incontinence, no perianal numbness, no bladder incontinence, no dysuria, no pelvic pain, no paresis, no tingling and no weakness. Risk factors include obesity.    Past Medical History  Diagnosis Date  . Pulmonary embolism  September 07, 2005     her CT angiogram - positive for pulmonary emboli to several branches of the right lower lobe- relatively small clot burden, clear lung; patient started on Coumadin; CT angiogram on January 10, 2006 showed resolution of previously seen pulmonary emboli with minimal basilar atelectasis  . Lung nodule  June 10, 2008     stable tiny noduke noted along the minor fissure of the right lung on CT angio September 11, 09 -  stable for 2 years and consistent with benign disease  . Arm DVT (deep venous thromboembolism), acute   September 23, 2009, January 05, 2010     Doppler study significant with indeterminant age DVT involving the left upper extremity, Doppler performed January 05, 2010 consistent with acute DVT involving the left upper extremity  . Asthma   . Anemia 2007     microcytic anemia, baseline hemoglobin 10-11, MCV at baseline 72-77, secondary to iron deficiency  . Schizophrenia   . Depression   . Leukopenia 2008     unclear etiology baseline WBC  2.8-3.7    Past Surgical History  Procedure Date  . Cesarean section      History of 5 C-section  . Tubal ligation   . Exploratory laparotomy with abdominal mass excision 02/2005    Family History  Problem Relation Age of Onset  . Hypertension Mother   . Heart disease Mother   . Diabetes Father   . Birth defects Maternal Aunt   . Birth defects Maternal Uncle   . Diabetes Paternal Grandmother     History  Substance Use Topics  . Smoking status: Current Everyday Smoker    Types: Cigarettes    Last Attempt to Quit: 06/26/2010  . Smokeless tobacco: Not on file  . Alcohol Use: Yes     occ beer    OB History    Grav Para Term Preterm Abortions TAB SAB Ect Mult Living   1               Review of Systems  Constitutional: Negative for fever, weight loss and unexpected weight change.  Gastrointestinal: Negative for abdominal pain, constipation and bowel incontinence.       Negative for fecal incontinence.   Genitourinary: Negative for bladder incontinence, dysuria, hematuria, flank pain, vaginal bleeding, vaginal discharge and pelvic pain.       Negative for urinary incontinence or retention.  Musculoskeletal: Positive for back pain.  Neurological: Negative for tingling, weakness and numbness.       Denies saddle paresthesias.    Allergies  Review of patient's allergies indicates no known allergies.  Home Medications   Current Outpatient Rx  Name Route Sig Dispense Refill  . ALBUTEROL SULFATE HFA 108 (90 BASE) MCG/ACT IN AERS Inhalation  Inhale 2 puffs into the lungs every 6 (six) hours as needed. For wheeze or shortness of breath    . ARIPIPRAZOLE 10 MG PO TABS Oral Take 10 mg by mouth daily.    Marland Kitchen NICOTINE 21 MG/24HR TD PT24 Transdermal Place 1 patch onto the skin daily.     . WARFARIN SODIUM 5 MG PO TABS Oral Take 15-20 mg by mouth daily. Takes 20mg  mon, wed, fri and 15mg  all other days      BP 119/73  Pulse 63  Temp 98.1 F (36.7 C) (Oral)  Resp 16  SpO2 100%  LMP 03/03/2012  Physical Exam  Nursing note and vitals reviewed. Constitutional: She appears well-developed and well-nourished.  HENT:  Head: Normocephalic and atraumatic.  Eyes: Conjunctivae are normal.  Neck: Normal range of motion. Neck supple.  Pulmonary/Chest: Effort normal.  Abdominal: Soft. There is no tenderness. There is no CVA tenderness.  Musculoskeletal: Normal range of motion.       There is no tenderness to palpation over cervical/thoracic/lumbar/sacral spine. Tenderness to palpation over L lumbar paraspinal muscles. No step-off noted with palpation of spine.   Neurological: She is alert. She has normal strength and normal reflexes. No sensory deficit. Gait normal.       5/5 strength in entire lower extremities bilaterally. No sensation deficit.   Skin: Skin is warm and dry. No rash noted.  Psychiatric: She has a normal mood and affect.    ED Course  Procedures (including critical care time)  Labs Reviewed - No data to display Dg Lumbar Spine Complete  03/17/2012  *RADIOLOGY REPORT*  Clinical Data: Back pain.  Fall.  LUMBAR SPINE - COMPLETE 4+ VIEW  Comparison: 02/23/2008  Findings: No vertebral body height loss.  Stable alignment.  Stable disc height.  No acute fracture.  IMPRESSION: No acute bony pathology.  Original Report Authenticated By: Donavan Burnet, M.D.     1. Back pain     12:11 PM Patient seen and examined. Medications ordered. X-ray ordered.   Vital signs reviewed and are as follows: Filed Vitals:   03/17/12 1017    BP: 119/73  Pulse: 63  Temp:   Resp: 16   X-ray neg, patient informed.   Patient was seen and examined. No red flag s/s of low back pain. Patient was counseled on back pain precautions and told to do activity as tolerated but do not lift, push, or pull heavy objects more than 10 pounds for the next week.  Patient counseled to use ice or heat on back for no longer than 15 minutes every hour.   Patient prescribed muscle relaxer and counseled on proper use of muscle relaxant medication.    Patient prescribed narcotic pain medicine and counseled on proper use of narcotic pain medications. Counseled not to combine this medication with others containing tylenol.  Urged patient not to drink alcohol, drive, or perform any other activities that requires focus while taking either of these medications.  Patient urged to follow-up with PCP if pain does not improve with treatment and rest or if pain becomes recurrent. Urged to return with worsening severe pain, loss of bowel or bladder control, trouble walking. Patient requested orthopedic referral and this was given.   The patient verbalizes understanding and agrees with the plan.  MDM  Patient with back pain. No neurological deficits. X-ray neg. Patient is ambulatory. No warning symptoms of back pain including: loss of bowel or bladder control, night sweats, waking from sleep with back pain, unexplained fevers or weight loss, h/o cancer, IVDU. No concern for cauda equina, epidural abscess, or other serious cause of back pain. Conservative measures such as rest, ice/heat and pain medicine indicated with PCP follow-up if no improvement with conservative management.          Renne Crigler, [place] 03/17/12 1758

## 2012-03-17 NOTE — Discharge Instructions (Signed)
Please read and follow all provided instructions.  Your diagnoses today include:  1. Back pain     Tests performed today include:  X-rays of your lower back - was normal  Vital signs - see below for your results today  Medications prescribed:   Vicodin (hydrocodone/acetaminophen) - narcotic pain medication  You have been prescribed narcotic pain medication such as Vicodin or Percocet: DO NOT drive or perform any activities that require you to be awake and alert because this medicine can make you drowsy. BE VERY CAREFUL not to take multiple medicines containing Tylenol (also called acetaminophen). Doing so can lead to an overdose which can damage your liver and cause liver failure and possibly death.    Robaxin (methocarbamol) - muscle relaxer medication  You have been prescribed a muscle relaxer medication such as Robaxin, Flexeril, or Valium: DO NOT drive or perform any activities that require you to be awake and alert because this medicine can make you drowsy.   Take any prescribed medications only as directed.  Home care instructions:   Follow any educational materials contained in this packet  Please rest, use ice or heat on your back for the next several days  Do not lift, push, pull anything more than 10 pounds for the next week  Follow-up instructions: Please follow-up with your primary care provider or the orthopedic doctor referral listed in the next 1 week for further evaluation of your symptoms. If you do not have a primary care doctor -- see below for referral information.   Return instructions:  SEEK IMMEDIATE MEDICAL ATTENTION IF YOU HAVE:  New numbness, tingling, weakness, or problem with the use of your arms or legs  Severe back pain not relieved with medications  Loss control of your bowels or bladder  Increasing pain in any areas of the body (such as chest or abdominal pain)  Shortness of breath, dizziness, or fainting.   Worsening nausea (feeling  sick to your stomach), vomiting, fever, or sweats  Any other emergent concerns regarding your health   Additional Information:  Your vital signs today were: BP 119/73  Pulse 63  Temp 98.1 F (36.7 C) (Oral)  Resp 16  SpO2 100%  LMP 03/03/2012 If your blood pressure (BP) was elevated above 135/85 this visit, please have this repeated by your doctor within one month. -------------- No Primary Care Doctor Call [facility]  [phone] Other agencies that provide inexpensive medical care    [facility]  [phone]    [facility]  [phone]    [facility]  [phone]    [facility]  [phone]    [facility]  [phone]    [facility]  [phone] -------------- RESOURCE GUIDE:  Dental Problems  Patients with Medicaid: [facility] [phone] [address]                                            [phone] [address] Phone:  [phone]  Phone:  [phone]  If unable to pay or uninsured, contact:  [facility] or [facility]. to become qualified for the adult dental clinic.  Chronic Pain Problems Contact [facility]  [phone] Patients need to be referred by their primary care doctor.  Insufficient Money for Medicine Contact [employer]:  call "211" or [facility] [phone].  Psychological Services [facility]  [phone] [facility]  [phone] [facility]   [phone] (emergency services [phone])  Substance Abuse Resources [facility]  [phone] [facility] [phone] [facility] [phone] Floydene Flock [phone] Residential & Outpatient Substance Abuse Program  [phone]  Abuse/Neglect [facility] Child Abuse Hotline [phone] [facility] Child Abuse Hotline [phone] (After  Hours)  Emergency Shelter [facility] [phone]  M[facility] [facility] [phone] [facility] [phone]  [facility] Resources  [facility]     [employer]                          [facility] [address] [place]                       [address]      [address]  Blondell Reveal Phone:  [phone]                                   Phone:  [phone]                 Phone:  [phone]  [facility] Phone:  [phone]  [facility] Child Abuse Hotline [phone] (After Hours)

## 2012-03-18 NOTE — ED Provider Notes (Signed)
Medical screening examination/treatment/procedure(s) were conducted as a shared visit with non-physician practitioner(s) and myself.  I personally evaluated the patient during the encounter  Natashia Roseman, MD 03/18/12 1533 

## 2012-04-06 ENCOUNTER — Ambulatory Visit (INDEPENDENT_AMBULATORY_CARE_PROVIDER_SITE_OTHER): Payer: Medicaid Other | Admitting: Pharmacist

## 2012-04-06 DIAGNOSIS — Z7901 Long term (current) use of anticoagulants: Secondary | ICD-10-CM

## 2012-04-06 DIAGNOSIS — I82409 Acute embolism and thrombosis of unspecified deep veins of unspecified lower extremity: Secondary | ICD-10-CM

## 2012-04-06 LAB — POCT INR: INR: 4.8

## 2012-04-06 NOTE — Patient Instructions (Signed)
Patient instructed to take medications as defined in the Anti-coagulation Track section of this encounter.  Patient instructed to OMIT today's dose.  Patient verbalized understanding of these instructions.    

## 2012-04-06 NOTE — Progress Notes (Signed)
Anti-Coagulation Progress Note  Michele Gillespie is a 36 y.o. female who is currently on an anti-coagulation regimen.    RECENT RESULTS: Recent results are below, the most recent result is correlated with a dose of 120 mg. per week: Lab Results  Component Value Date   INR 4.80 04/06/2012   INR 2.30 03/16/2012   INR 2.96* 11/29/2011    ANTI-COAG DOSE:   Latest dosing instructions   Total Sun Mon Tue Wed Thu Fri Sat   110 15 mg 15 mg 15 mg 20 mg 15 mg 15 mg 15 mg    (5 mg3) (5 mg3) (5 mg3) (5 mg4) (5 mg3) (5 mg3) (5 mg3)         ANTICOAG SUMMARY: Anticoagulation Episode Summary              Current INR goal 2.0-3.0 Next INR check 04/20/2012   INR from last check 4.80! (04/06/2012)     Weekly max dose (mg)  Target end date Indefinite   Indications Pulmonary embolism and infarction, Deep venous thrombosis of leg, Long term current use of anticoagulant   INR check location [facility] Preferred lab    Send INR reminders to ANTICOAG IMP   Comments             ANTICOAG TODAY: Anticoagulation Summary as of 04/06/2012              INR goal 2.0-3.0     Selected INR 4.80! (04/06/2012) Next INR check 04/20/2012   Weekly max dose (mg)  Target end date Indefinite   Indications Pulmonary embolism and infarction, Deep venous thrombosis of leg, Long term current use of anticoagulant    Anticoagulation Episode Summary              INR check location [facility] Preferred lab    Send INR reminders to ANTICOAG IMP   Comments             PATIENT INSTRUCTIONS: Patient Instructions  Patient instructed to take medications as defined in the Anti-coagulation Track section of this encounter.  Patient instructed to OMIT today's dose.  Patient verbalized understanding of these instructions.        FOLLOW-UP Return in 2 weeks (on 04/20/2012) for Follow up INR at 1000h.  Hulen Luster, III Pharm.D., CACP

## 2012-04-08 ENCOUNTER — Ambulatory Visit: Payer: Medicaid Other | Admitting: Physical Therapy

## 2012-04-13 ENCOUNTER — Encounter: Payer: Medicaid Other | Admitting: Internal Medicine

## 2012-04-20 ENCOUNTER — Ambulatory Visit: Payer: Medicaid Other

## 2012-04-23 ENCOUNTER — Ambulatory Visit: Payer: Medicaid Other | Admitting: Rehabilitative and Restorative Service Providers"

## 2012-04-27 ENCOUNTER — Ambulatory Visit: Payer: Medicaid Other

## 2012-04-27 ENCOUNTER — Ambulatory Visit (INDEPENDENT_AMBULATORY_CARE_PROVIDER_SITE_OTHER): Payer: Medicaid Other | Admitting: Pharmacist

## 2012-04-27 DIAGNOSIS — I82409 Acute embolism and thrombosis of unspecified deep veins of unspecified lower extremity: Secondary | ICD-10-CM

## 2012-04-27 DIAGNOSIS — Z7901 Long term (current) use of anticoagulants: Secondary | ICD-10-CM

## 2012-04-27 LAB — POCT INR: INR: 2.8

## 2012-04-27 NOTE — Patient Instructions (Signed)
Patient instructed to take medications as defined in the Anti-coagulation Track section of this encounter.  Patient instructed to take today's dose.  Patient verbalized understanding of these instructions.    

## 2012-04-27 NOTE — Progress Notes (Signed)
Anti-Coagulation Progress Note  Michele Gillespie is a 36 y.o. female who is currently on an anti-coagulation regimen.    RECENT RESULTS: Recent results are below, the most recent result is correlated with a dose of 110 mg. per week: Lab Results  Component Value Date   INR 2.80 04/27/2012   INR 4.80 04/06/2012   INR 2.30 03/16/2012    ANTI-COAG DOSE:   Latest dosing instructions   Total Sun Mon Tue Wed Thu Fri Sat   105 15 mg 15 mg 15 mg 15 mg 15 mg 15 mg 15 mg    (5 mg3) (5 mg3) (5 mg3) (5 mg3) (5 mg3) (5 mg3) (5 mg3)         ANTICOAG SUMMARY: Anticoagulation Episode Summary              Current INR goal 2.0-3.0 Next INR check 05/25/2012   INR from last check 2.80 (04/27/2012)     Weekly max dose (mg)  Target end date Indefinite   Indications Pulmonary embolism and infarction, Deep venous thrombosis of leg, Long term current use of anticoagulant   INR check location [facility] Preferred lab    Send INR reminders to ANTICOAG IMP   Comments             ANTICOAG TODAY: Anticoagulation Summary as of 04/27/2012              INR goal 2.0-3.0     Selected INR 2.80 (04/27/2012) Next INR check 05/25/2012   Weekly max dose (mg)  Target end date Indefinite   Indications Pulmonary embolism and infarction, Deep venous thrombosis of leg, Long term current use of anticoagulant    Anticoagulation Episode Summary              INR check location [facility] Preferred lab    Send INR reminders to ANTICOAG IMP   Comments             PATIENT INSTRUCTIONS: Patient Instructions  Patient instructed to take medications as defined in the Anti-coagulation Track section of this encounter.  Patient instructed to take today's dose.  Patient verbalized understanding of these instructions.        FOLLOW-UP Return in 4 weeks (on 05/25/2012) for Follow up INR at 1400h.  Hulen Luster, III Pharm.D., CACP

## 2012-05-01 ENCOUNTER — Encounter: Payer: Self-pay | Admitting: Internal Medicine

## 2012-05-04 ENCOUNTER — Encounter: Payer: Self-pay | Admitting: Internal Medicine

## 2012-05-05 ENCOUNTER — Ambulatory Visit: Payer: Medicaid Other | Attending: Orthopedic Surgery | Admitting: Physical Therapy

## 2012-05-05 DIAGNOSIS — IMO0001 Reserved for inherently not codable concepts without codable children: Secondary | ICD-10-CM | POA: Insufficient documentation

## 2012-05-05 DIAGNOSIS — M545 Low back pain, unspecified: Secondary | ICD-10-CM | POA: Insufficient documentation

## 2012-05-05 DIAGNOSIS — R293 Abnormal posture: Secondary | ICD-10-CM | POA: Insufficient documentation

## 2012-05-06 ENCOUNTER — Other Ambulatory Visit: Payer: Self-pay | Admitting: Internal Medicine

## 2012-05-06 DIAGNOSIS — Z1231 Encounter for screening mammogram for malignant neoplasm of breast: Secondary | ICD-10-CM

## 2012-05-18 ENCOUNTER — Ambulatory Visit (INDEPENDENT_AMBULATORY_CARE_PROVIDER_SITE_OTHER): Payer: Medicaid Other | Admitting: Internal Medicine

## 2012-05-18 ENCOUNTER — Encounter: Payer: Self-pay | Admitting: Internal Medicine

## 2012-05-18 ENCOUNTER — Encounter: Payer: Medicaid Other | Admitting: Rehabilitation

## 2012-05-18 VITALS — BP 131/88 | HR 75 | Temp 97.3°F | Ht 64.5 in | Wt 221.9 lb

## 2012-05-18 DIAGNOSIS — R51 Headache: Secondary | ICD-10-CM

## 2012-05-18 DIAGNOSIS — Z8659 Personal history of other mental and behavioral disorders: Secondary | ICD-10-CM

## 2012-05-18 DIAGNOSIS — Z7901 Long term (current) use of anticoagulants: Secondary | ICD-10-CM

## 2012-05-18 DIAGNOSIS — R519 Headache, unspecified: Secondary | ICD-10-CM | POA: Insufficient documentation

## 2012-05-18 DIAGNOSIS — R079 Chest pain, unspecified: Secondary | ICD-10-CM

## 2012-05-18 DIAGNOSIS — H1013 Acute atopic conjunctivitis, bilateral: Secondary | ICD-10-CM

## 2012-05-18 DIAGNOSIS — Z113 Encounter for screening for infections with a predominantly sexual mode of transmission: Secondary | ICD-10-CM | POA: Insufficient documentation

## 2012-05-18 MED ORDER — OLOPATADINE HCL 0.1 % OP SOLN: 1.0000 [drp] | Freq: Two times a day (BID) | OPHTHALMIC | Status: DC

## 2012-05-18 NOTE — Assessment & Plan Note (Signed)
Likely stress related. In the absence of any association with sound, light  Or aura, migraine is very less likely .  - She was advised to take  Tylenol.

## 2012-05-18 NOTE — Assessment & Plan Note (Signed)
Follows up for Dr. Alexandria Lodge. Her last INR was 2.8.

## 2012-05-18 NOTE — Assessment & Plan Note (Addendum)
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  - Recheck RPR and HIV
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
## 2012-05-19 MED ORDER — OLOPATADINE HCL 0.2 % OP SOLN: 1.0000 [drp] | Freq: Every day | OPHTHALMIC | Status: DC

## 2012-05-19 NOTE — Patient Instructions (Signed)
Please schedule a follow up appointment in 3 - 6 months or sooner if needed . Please bring your medication bottles with your next appointment. Please take your medicines as prescribed. I will call you with your lab results if anything will be abnormal.

## 2012-05-25 ENCOUNTER — Ambulatory Visit (INDEPENDENT_AMBULATORY_CARE_PROVIDER_SITE_OTHER): Payer: Medicaid Other | Admitting: Pharmacist

## 2012-05-25 DIAGNOSIS — Z7901 Long term (current) use of anticoagulants: Secondary | ICD-10-CM

## 2012-05-25 DIAGNOSIS — I82409 Acute embolism and thrombosis of unspecified deep veins of unspecified lower extremity: Secondary | ICD-10-CM

## 2012-05-25 DIAGNOSIS — D6859 Other primary thrombophilia: Secondary | ICD-10-CM

## 2012-05-25 LAB — POCT INR: INR: 4.2

## 2012-05-25 NOTE — Progress Notes (Signed)
Anti-Coagulation Progress Note  Michele Gillespie is a 36 y.o. female who is currently on an anti-coagulation regimen.    RECENT RESULTS: Recent results are below, the most recent result is correlated with a dose of 105 mg. per week: Lab Results  Component Value Date   INR 4.20 05/25/2012   INR 2.80 04/27/2012   INR 4.80 04/06/2012    ANTI-COAG DOSE:   Latest dosing instructions   Total Sun Mon Tue Wed Thu Fri Sat   97.5 15 mg 15 mg 12.5 mg 15 mg 12.5 mg 15 mg 12.5 mg    (5 mg3) (5 mg3) (5 mg2.5) (5 mg3) (5 mg2.5) (5 mg3) (5 mg2.5)         ANTICOAG SUMMARY: Anticoagulation Episode Summary              Current INR goal 2.0-3.0 Next INR check 06/08/2012   INR from last check 4.20! (05/25/2012)     Weekly max dose (mg)  Target end date Indefinite   Indications HYPERCOAGULABLE STATE, PRIMARY, Long term current use of anticoagulant   INR check location [facility] Preferred lab    Send INR reminders to    Comments             ANTICOAG TODAY: Anticoagulation Summary as of 05/25/2012              INR goal 2.0-3.0     Selected INR 4.20! (05/25/2012) Next INR check 06/08/2012   Weekly max dose (mg)  Target end date Indefinite   Indications HYPERCOAGULABLE STATE, PRIMARY, Long term current use of anticoagulant    Anticoagulation Episode Summary              INR check location [facility] Preferred lab    Send INR reminders to    Comments             PATIENT INSTRUCTIONS: Patient Instructions  Patient instructed to take medications as defined in the Anti-coagulation Track section of this encounter.  Patient instructed to take today's dose.  Patient verbalized understanding of these instructions.        FOLLOW-UP Return in 2 weeks (on 06/08/2012) for Follow up INR at 2:30PM.  Hulen Luster, III Pharm.D., CACP

## 2012-05-25 NOTE — Patient Instructions (Signed)
Patient instructed to take medications as defined in the Anti-coagulation Track section of this encounter.  Patient instructed to take today's dose.  Patient verbalized understanding of these instructions.    

## 2012-05-26 ENCOUNTER — Encounter: Payer: Medicaid Other | Admitting: Rehabilitation

## 2012-05-26 ENCOUNTER — Other Ambulatory Visit: Payer: Self-pay | Admitting: *Deleted

## 2012-05-26 NOTE — Telephone Encounter (Signed)
Please forward it Dr. Alexandria Lodge.  Thank you, Michele Gillespie

## 2012-05-26 NOTE — Telephone Encounter (Signed)
Please review dosing instructions and change if needed

## 2012-05-27 MED ORDER — WARFARIN SODIUM 5 MG PO TABS: 15.0000 mg | ORAL_TABLET | Freq: Every day | ORAL | Status: DC

## 2012-06-02 ENCOUNTER — Encounter: Payer: Medicaid Other | Admitting: Rehabilitation

## 2012-06-08 ENCOUNTER — Ambulatory Visit (INDEPENDENT_AMBULATORY_CARE_PROVIDER_SITE_OTHER): Payer: Medicaid Other | Admitting: Pharmacist

## 2012-06-08 DIAGNOSIS — D6859 Other primary thrombophilia: Secondary | ICD-10-CM

## 2012-06-08 DIAGNOSIS — Z7901 Long term (current) use of anticoagulants: Secondary | ICD-10-CM

## 2012-06-08 DIAGNOSIS — I82409 Acute embolism and thrombosis of unspecified deep veins of unspecified lower extremity: Secondary | ICD-10-CM

## 2012-06-09 NOTE — Progress Notes (Signed)
Anti-Coagulation Progress Note  Michele Gillespie is a 36 y.o. female who is currently on an anti-coagulation regimen.    RECENT RESULTS: Recent results are below, the most recent result is correlated with a dose of 97.5 mg. per week: Lab Results  Component Value Date   INR 4.0 06/09/2012   INR 4.20 05/25/2012   INR 2.80 04/27/2012    ANTI-COAG DOSE:   Latest dosing instructions   Total Sun Mon Tue Wed Thu Fri Sat   85 12.5 mg 12.5 mg 12.5 mg 10 mg 12.5 mg 12.5 mg 12.5 mg    (5 mg2.5) (5 mg2.5) (5 mg2.5) (5 mg2) (5 mg2.5) (5 mg2.5) (5 mg2.5)         ANTICOAG SUMMARY: Anticoagulation Episode Summary              Current INR goal 2.0-3.0 Next INR check 06/22/2012   INR from last check 4.20! (05/25/2012) Most recent INR 4.0! (06/09/2012)   Weekly max dose (mg)  Target end date Indefinite   Indications HYPERCOAGULABLE STATE, PRIMARY, Long term current use of anticoagulant   [facility] check [facility]    Send INR reminders to    Comments             ANTICOAG TODAY: Anticoagulation Summary as of 06/08/2012              INR goal 2.0-3.0     Selected INR 4.20! (05/25/2012) Next INR check 06/22/2012   Weekly max dose (mg)  Target end date Indefinite   Indications HYPERCOAGULABLE STATE, PRIMARY, Long term current use of anticoagulant    Anticoagulation Episode Summary              [facility] check [facility] Preferred lab    Send INR reminders to    Comments             PATIENT INSTRUCTIONS: Patient Instructions  Patient instructed to take medications as defined in the Anti-coagulation Track section of this encounter.  Patient instructed to take today's dose.  Patient verbalized understanding of these instructions.        FOLLOW-UP Return in 2 weeks (on 06/22/2012) for Follow up INR at 2:30PM.  Hulen Luster, III Pharm.D., CACP

## 2012-06-09 NOTE — Patient Instructions (Signed)
Patient instructed to take medications as defined in the Anti-coagulation Track section of this encounter.  Patient instructed to take today's dose.  Patient verbalized understanding of these instructions.    

## 2012-06-10 ENCOUNTER — Ambulatory Visit
Admission: RE | Admit: 2012-06-10 | Discharge: 2012-06-10 | Disposition: A | Discharge status: Home / Self Care | Payer: Medicaid Other | Source: Ambulatory Visit | Attending: Internal Medicine | Admitting: Internal Medicine

## 2012-06-10 DIAGNOSIS — Z1231 Encounter for screening mammogram for malignant neoplasm of breast: Secondary | ICD-10-CM

## 2012-06-11 ENCOUNTER — Encounter: Payer: Self-pay | Admitting: Internal Medicine

## 2012-06-11 ENCOUNTER — Other Ambulatory Visit: Payer: Self-pay | Admitting: Internal Medicine

## 2012-06-11 ENCOUNTER — Ambulatory Visit (INDEPENDENT_AMBULATORY_CARE_PROVIDER_SITE_OTHER): Payer: Medicaid Other | Admitting: Internal Medicine

## 2012-06-11 ENCOUNTER — Other Ambulatory Visit (HOSPITAL_COMMUNITY)
Admission: RE | Admit: 2012-06-11 | Discharge: 2012-06-11 | Disposition: A | Discharge status: Home / Self Care | Payer: Medicaid Other | Source: Ambulatory Visit | Attending: Internal Medicine | Admitting: Internal Medicine

## 2012-06-11 VITALS — BP 125/90 | HR 80 | Temp 98.6°F | Ht 64.5 in | Wt 223.1 lb

## 2012-06-11 DIAGNOSIS — R928 Other abnormal and inconclusive findings on diagnostic imaging of breast: Secondary | ICD-10-CM

## 2012-06-11 DIAGNOSIS — N760 Acute vaginitis: Secondary | ICD-10-CM | POA: Insufficient documentation

## 2012-06-11 DIAGNOSIS — N898 Other specified noninflammatory disorders of vagina: Secondary | ICD-10-CM

## 2012-06-11 MED ORDER — METRONIDAZOLE 500 MG PO TABS: 500.0000 mg | ORAL_TABLET | Freq: Two times a day (BID) | ORAL | Status: DC

## 2012-06-11 NOTE — Patient Instructions (Addendum)
1.  Start Metronidazole 500 mg tablets.  Take 1 tablet twice daily for your infection.  Bacterial Vaginosis Bacterial vaginosis is an infection of the vagina. A healthy vagina has many kinds of good germs (bacteria). Sometimes the number of good germs can change. This allows bad germs to move in and cause an infection. You may be given medicine (antibiotics) to treat the infection. Or, you may not need treatment at all. HOME CARE  Take your medicine as told. Finish them even if you start to feel better.   Do not have sex until you finish your medicine.   Do not douche.   Practice safe sex.   Tell your sex partner that you have an infection. They should see their doctor for treatment if they have problems.  GET HELP RIGHT AWAY IF:  You do not get better after 3 days of treatment.   You have grey fluid (discharge) coming from your vagina.   You have pain.   You have a temperature of 102 F (38.9 C) or higher.  MAKE SURE YOU:   Understand these instructions.   Will watch your condition.   Will get help right away if you are not doing well or get worse.  Document Released: 06/25/2008 Document Revised: 09/05/2011 Document Reviewed: 06/25/2008 [facility] Patient Information 2012 [place], [place].

## 2012-06-11 NOTE — Progress Notes (Signed)
Subjective:   Patient ID: Michele Gillespie female   DOB: 10-12-1975 36 y.o.   MRN: [id number]  HPI: [name] is a 36 y.o. woman who presents to clinic today complaining of vaginal discharge.  She states that the discharge started yesterday.  She denies itching and burning, fevers, chills, nausea, vomiting, diarrhea, or constipation.  She states that she has had several episodes of bacterial vaginosis in the past that responded to metronidazole.  She states that she is sexually active with 1 female partner who, she states, has no symptoms currently.   Past Medical History  Diagnosis Date  . Pulmonary embolism  September 07, 2005     her CT angiogram - positive for pulmonary emboli to several branches of the right lower lobe- relatively small clot burden, clear lung; patient started on Coumadin; CT angiogram on January 10, 2006 showed resolution of previously seen pulmonary emboli with minimal basilar atelectasis  . Lung nodule  June 10, 2008     stable tiny noduke noted along the minor fissure of the right lung on CT angio September 11, 09 -  stable for 2 years and consistent with benign disease  . Arm DVT (deep venous thromboembolism), acute  September 23, 2009, January 05, 2010     Doppler study significant with indeterminant age DVT involving the left upper extremity, Doppler performed January 05, 2010 consistent with acute DVT involving the left upper extremity  . Asthma   . Anemia 2007     microcytic anemia, baseline hemoglobin 10-11, MCV at baseline 72-77, secondary to iron deficiency  . Schizophrenia   . Depression   . Leukopenia 2008     unclear etiology baseline WBC  2.8-3.7   Current Outpatient Prescriptions  Medication Sig Dispense Refill  . albuterol (VENTOLIN HFA) 108 (90 BASE) MCG/ACT inhaler Inhale 2 puffs into the lungs every 6 (six) hours as needed. For wheeze or shortness of breath      . ARIPiprazole (ABILIFY) 10 MG tablet Take 10 mg by mouth daily.      . nicotine  (NICODERM CQ - DOSED IN MG/24 HOURS) 21 mg/24hr patch Place 1 patch onto the skin daily.       . Olopatadine HCl 0.2 % SOLN Place 1 drop into both eyes daily.  2.5 mL  1  . warfarin (COUMADIN) 5 MG tablet Take 3-4 tablets (15-20 mg total) by mouth daily. Take 12.5mg  to 20mg  according to most recent visit  120 tablet  2   Family History  Problem Relation Age of Onset  . Hypertension Mother   . Heart disease Mother   . Diabetes Father   . Birth defects Maternal Aunt   . Birth defects Maternal Uncle   . Diabetes Paternal Grandmother    History   Social History  . Marital Status: Married    Spouse Name: N/A    Number of Children: N/A  . Years of Education: N/A   Social History Main Topics  . Smoking status: Current Every Day Smoker    Types: Cigarettes    Last Attempt to Quit: 06/26/2010  . Smokeless tobacco: None  . Alcohol Use: Yes     occ beer  . Drug Use: Yes    Special: Marijuana  . Sexually Active: Yes    Birth Control/ Protection: None   Other Topics Concern  . 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   Objective:  Physical Exam: Filed Vitals:   06/11/12 0907  BP: 125/90  Pulse: 80  Temp: 98.6 F (37 C)  TempSrc: Oral  Height: 5' 4.5" (1.638 m)  Weight: 223 lb 1.6 oz (101.197 kg)  SpO2: 99%   Constitutional: Vital signs reviewed.  Patient is a well-developed and well-nourished obese woman in no acute distress and cooperative with exam. Alert and oriented x3.  Head: Normocephalic and atraumatic Ear: TM normal bilaterally Mouth: no erythema or exudates, MMM Eyes: PERRL, EOMI, conjunctivae normal, No scleral icterus.  Neck: Supple, Trachea midline normal ROM, No JVD, mass, thyromegaly, or carotid bruit present.  Cardiovascular: RRR, S1 normal, S2  normal, no MRG, pulses symmetric and intact bilaterally Pulmonary/Chest: CTAB, no wheezes, rales, or rhonchi Abdominal: Soft. Non-tender, non-distended, bowel sounds are normal, no masses, organomegaly, or guarding present.  GU: External genital exam with no lesions.  There is a white, creamy discharge noted prior to insertion of the speculum.  Samples were taken for wet prep and DNA probe for Candida, Gardnerella, and Trichomonas. No cervical motion tenderness or adnexal tenderness noted on bimanual exam.  Skin: Warm, dry and intact. No rash, cyanosis, or clubbing.  Psychiatric: Normal mood and affect. speech and behavior is normal. Judgment and thought content normal. Cognition and memory are normal.   Assessment & Plan:

## 2012-06-15 ENCOUNTER — Other Ambulatory Visit: Payer: Self-pay | Admitting: *Deleted

## 2012-06-15 DIAGNOSIS — N898 Other specified noninflammatory disorders of vagina: Secondary | ICD-10-CM

## 2012-06-16 MED ORDER — FLUCONAZOLE 150 MG PO TABS: 150.0000 mg | ORAL_TABLET | Freq: Once | ORAL | Status: DC

## 2012-06-16 MED ORDER — METRONIDAZOLE 500 MG PO TABS: 500.0000 mg | ORAL_TABLET | Freq: Two times a day (BID) | ORAL | Status: AC

## 2012-06-16 NOTE — Telephone Encounter (Signed)
Pt has already taken coumadin today.  I talked with Dr Rogelia Boga and Dr Alexandria Lodge and pt will hold tomorrow's coumadin dose and restart on 9/19.  Also she will start metronidazole and diflucan on 19th.   I scheduled her an appointment with Dr Alexandria Lodge on Friday 9/20 to check INR. Pt voices understanding.

## 2012-06-16 NOTE — Telephone Encounter (Signed)
I will call in the diflucan as well as the refill for the metronidazole.  Please call the patient and advise her to hold today (9/17) dose of coumadin and restart her coumadin on 9/18.  She has an appointment on 9/23 with Dr. Alexandria Lodge to recheck her INR and it is very important that she keep that.  Also advise her to be on the look out for bleeding and that if she notices any to please call us right away.

## 2012-06-19 ENCOUNTER — Ambulatory Visit (INDEPENDENT_AMBULATORY_CARE_PROVIDER_SITE_OTHER): Payer: Medicaid Other | Admitting: Pharmacist

## 2012-06-19 ENCOUNTER — Ambulatory Visit
Admission: RE | Admit: 2012-06-19 | Discharge: 2012-06-19 | Disposition: A | Discharge status: Home / Self Care | Payer: Medicaid Other | Source: Ambulatory Visit | Attending: Internal Medicine | Admitting: Internal Medicine

## 2012-06-19 DIAGNOSIS — R928 Other abnormal and inconclusive findings on diagnostic imaging of breast: Secondary | ICD-10-CM

## 2012-06-19 DIAGNOSIS — Z7901 Long term (current) use of anticoagulants: Secondary | ICD-10-CM

## 2012-06-19 DIAGNOSIS — D6859 Other primary thrombophilia: Secondary | ICD-10-CM

## 2012-06-19 DIAGNOSIS — I82409 Acute embolism and thrombosis of unspecified deep veins of unspecified lower extremity: Secondary | ICD-10-CM

## 2012-06-19 NOTE — Patient Instructions (Signed)
Patient instructed to take medications as defined in the Anti-coagulation Track section of this encounter.  Patient instructed to take today's dose.  Patient verbalized understanding of these instructions.    

## 2012-06-19 NOTE — Progress Notes (Signed)
Anti-Coagulation Progress Note  Michele Gillespie is a 36 y.o. female who is currently on an anti-coagulation regimen.    RECENT RESULTS: Recent results are below, the most recent result is correlated with a dose of 85 mg. per week--but with ONE OMITTED dose in anticipation of hypoprothrombinemic response from drug drug interaction with metronidazole and fluconazole. She has completed both of these. Today's INR reflects the potential for the INR increase. The empiric one-day held dose seems to have kept INR within range: Lab Results  Component Value Date   INR 2.10 06/19/2012   INR 4.0 06/09/2012   INR 4.20 05/25/2012    ANTI-COAG DOSE:   Latest dosing instructions   Total Sun Mon Tue Wed Thu Fri Sat   85 12.5 mg 12.5 mg 12.5 mg 10 mg 12.5 mg 12.5 mg 12.5 mg    (5 mg2.5) (5 mg2.5) (5 mg2.5) (5 mg2) (5 mg2.5) (5 mg2.5) (5 mg2.5)         ANTICOAG SUMMARY: Anticoagulation Episode Summary              Current INR goal 2.0-3.0 Next INR check 07/06/2012   INR from last check 2.10 (06/19/2012)     Weekly max dose (mg)  Target end date Indefinite   Indications HYPERCOAGULABLE STATE, PRIMARY, Long term current use of anticoagulant   [facility] Preferred [facility]    Send INR reminders to    Comments             ANTICOAG TODAY: Anticoagulation Summary as of 06/19/2012              INR goal 2.0-3.0     Selected INR 2.10 (06/19/2012) Next INR check 07/06/2012   Weekly max dose (mg)  Target end date Indefinite   Indications HYPERCOAGULABLE STATE, PRIMARY, Long term current use of anticoagulant    Anticoagulation Episode Summary              [facility] Preferred lab    Send INR reminders to    Comments             PATIENT INSTRUCTIONS: Patient Instructions  Patient instructed to take medications as defined in the Anti-coagulation Track section of this encounter.  Patient instructed to take today's dose.  Patient verbalized  understanding of these instructions.        FOLLOW-UP Return in 2 weeks (on 07/06/2012) for Follow up INR at 1000h.  Hulen Luster, III Pharm.D., CACP

## 2012-06-22 ENCOUNTER — Ambulatory Visit: Payer: Medicaid Other

## 2012-06-22 ENCOUNTER — Ambulatory Visit (INDEPENDENT_AMBULATORY_CARE_PROVIDER_SITE_OTHER): Payer: Medicaid Other | Admitting: Internal Medicine

## 2012-06-22 VITALS — BP 126/89 | HR 76 | Temp 98.2°F | Ht 64.0 in | Wt 227.8 lb

## 2012-06-22 DIAGNOSIS — N898 Other specified noninflammatory disorders of vagina: Secondary | ICD-10-CM

## 2012-06-22 DIAGNOSIS — F17200 Nicotine dependence, unspecified, uncomplicated: Secondary | ICD-10-CM | POA: Insufficient documentation

## 2012-06-22 MED ORDER — NICOTINE 14 MG/24HR TD PT24: 1.0000 | MEDICATED_PATCH | TRANSDERMAL | Status: DC

## 2012-06-22 NOTE — Patient Instructions (Signed)
1. Avoid baths for now.  Showers only  2. Wear white cotton panties for now to see if that helps the itching go away  3.  Wait until you and your partner completely finish the treatment before engaging in sexual contact.  4.  Use the Nicotine patches to help you quit smoking.  You can do it!!  5.  Follow up in about 6 months to see how things are going.  Vaginitis Vaginitis is when the vagina is sore, puffy (swollen), and red (inflamed). HOME CARE  Take your medicine as told. Finish them even if you start to feel better.   Do not have sex (intercourse) until treatment is done or as told by your doctor.   Take warm baths or sit in warm water (sitz bath).   Do not douche.   Do not use tampons, especially scented ones.   Wear cotton underwear.   Do not wear tight pants or pantyhose.   Tell your sex partner that you have a yeast infection. Your partner should see a doctor if symptoms appear, such as a mild rash or itching.   Your sex partner should be treated if your infection is hard to get rid of.   Use a cream to stop the itching. Make sure it is approved by your doctor.  GET HELP RIGHT AWAY IF:   The infection does not go away with treatment.   You have a temperature by mouth above 102 F (38.9 C).   You have belly (abdominal) pain.   You have bleeding from the vagina.  MAKE SURE YOU:   Understand these instructions.   Will watch this condition.   Will get help right away if you are not doing well or get worse.  Document Released: 12/13/2008 Document Revised: 09/05/2011 Document Reviewed: 12/13/2008 [facility] Patient Information 2012 [place], [place].

## 2012-06-22 NOTE — Progress Notes (Signed)
Subjective:   Patient ID: Michele Gillespie female   DOB: 02-18-1976 36 y.o.   MRN: [id number]  HPI: [name] is a 36 y.o. woman who presents to clinic today for follow up from her last visit.  She states that she has only one more pill of the Flagyl to take and that she took the second diflucan earlier today because of continued itching. She denies further discharge, fevers, chills, nausea, vomiting, or diarrhea  She states that her partner has started his treatment.  She has a history of repeated vaginal infections and yeast infections and her last sample earlier this month was positive for Gardnerella as well as Trichomonas.    Past Medical History  Diagnosis Date  . Pulmonary embolism  September 07, 2005     her CT angiogram - positive for pulmonary emboli to several branches of the right lower lobe- relatively small clot burden, clear lung; patient started on Coumadin; CT angiogram on January 10, 2006 showed resolution of previously seen pulmonary emboli with minimal basilar atelectasis  . Lung nodule  June 10, 2008     stable tiny noduke noted along the minor fissure of the right lung on CT angio September 11, 09 -  stable for 2 years and consistent with benign disease  . Arm DVT (deep venous thromboembolism), acute  September 23, 2009, January 05, 2010     Doppler study significant with indeterminant age DVT involving the left upper extremity, Doppler performed January 05, 2010 consistent with acute DVT involving the left upper extremity  . Asthma   . Anemia 2007     microcytic anemia, baseline hemoglobin 10-11, MCV at baseline 72-77, secondary to iron deficiency  . Schizophrenia   . Depression   . Leukopenia 2008     unclear etiology baseline WBC  2.8-3.7   Current Outpatient Prescriptions  Medication Sig Dispense Refill  . albuterol (VENTOLIN HFA) 108 (90 BASE) MCG/ACT inhaler Inhale 2 puffs into the lungs every 6 (six) hours as needed. For wheeze or shortness of breath      .  ARIPiprazole (ABILIFY) 10 MG tablet Take 10 mg by mouth daily.      . fluconazole (DIFLUCAN) 150 MG tablet Take 1 tablet (150 mg total) by mouth once. If symptoms persist for 3 days take the second tablet.  2 tablet  0  . metroNIDAZOLE (FLAGYL) 500 MG tablet Take 1 tablet (500 mg total) by mouth 2 (two) times daily.  14 tablet  0  . nicotine (NICODERM CQ - DOSED IN MG/24 HOURS) 21 mg/24hr patch Place 1 patch onto the skin daily.       . Olopatadine HCl 0.2 % SOLN Place 1 drop into both eyes daily.  2.5 mL  1  . warfarin (COUMADIN) 5 MG tablet Take 3-4 tablets (15-20 mg total) by mouth daily. Take 12.5mg  to 20mg  according to most recent visit  120 tablet  2   Family History  Problem Relation Age of Onset  . Hypertension Mother   . Heart disease Mother   . Diabetes Father   . Birth defects Maternal Aunt   . Birth defects Maternal Uncle   . Diabetes Paternal Grandmother    History   Social History  . Marital Status: Married    Spouse Name: N/A    Number of Children: N/A  . Years of Education: N/A   Social History Main Topics  . Smoking status: Current Every Day Smoker    Types: Cigarettes  Last Attempt to Quit: 06/26/2010  . Smokeless tobacco: Not on file  . Alcohol Use: Yes     occ beer  . Drug Use: Yes    Special: Marijuana  . Sexually Active: Yes    Birth Control/ Protection: None   Other Topics Concern  . 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   Objective:  Physical Exam: Filed Vitals:   06/22/12 1322  BP: 126/89  Pulse: 76  Temp: 98.2 F (36.8 C)  TempSrc: Oral  Height: 5\' 4"  (1.626 m)  Weight: 227 lb 12.8 oz (103.329 kg)  SpO2: 99%   Constitutional: Vital signs reviewed.  Patient is a well-developed and  well-nourished woman in no acute distress and cooperative with exam. Alert and oriented x3.  Head: Normocephalic and atraumatic Ear: TM normal bilaterally Mouth: no erythema or exudates, MMM Eyes: PERRL, EOMI, conjunctivae normal, No scleral icterus.  Neck: Supple, Trachea midline normal ROM, No JVD, mass, thyromegaly, or carotid bruit present.  Cardiovascular: RRR, S1 normal, S2 normal, no MRG, pulses symmetric and intact bilaterally Pulmonary/Chest: CTAB, no wheezes, rales, or rhonchi Abdominal: Soft. Non-tender, non-distended, bowel sounds are normal, no masses, organomegaly, or guarding present.  GU: no CVA tenderness, exterior genitalia without erythema or lesions.  No drainage noted.   Musculoskeletal: No joint deformities, erythema, or stiffness, ROM full and no nontender Hematology: no cervical, inginal, or axillary adenopathy.  Neurological: A&O x3, Strength is normal and symmetric bilaterally, cranial nerve II-XII are grossly intact, no focal motor deficit, sensory intact to light touch bilaterally.  Skin: Warm, dry and intact. No rash, cyanosis, or clubbing.  Psychiatric: Normal mood and affect. speech and behavior is normal. Judgment and thought content normal. Cognition and memory are normal.   Assessment & Plan:

## 2012-06-23 LAB — URINALYSIS, MICROSCOPIC ONLY
Casts: NONE SEEN
Crystals: NONE SEEN

## 2012-06-23 LAB — URINALYSIS, ROUTINE W REFLEX MICROSCOPIC
Bilirubin Urine: NEGATIVE
Glucose, UA: NEGATIVE mg/dL
Leukocytes, UA: NEGATIVE
Protein, ur: NEGATIVE mg/dL
Specific Gravity, Urine: 1.024 (ref 1.005–1.030)
pH: 5 (ref 5.0–8.0)

## 2012-07-06 ENCOUNTER — Ambulatory Visit: Payer: Medicaid Other

## 2012-07-29 ENCOUNTER — Emergency Department (HOSPITAL_COMMUNITY): Payer: Medicaid Other

## 2012-07-29 ENCOUNTER — Telehealth: Payer: Self-pay | Admitting: *Deleted

## 2012-07-29 ENCOUNTER — Encounter (HOSPITAL_COMMUNITY): Payer: Self-pay

## 2012-07-29 ENCOUNTER — Emergency Department (HOSPITAL_COMMUNITY)
Admission: EM | Admit: 2012-07-29 | Discharge: 2012-07-29 | Disposition: A | Discharge status: Home / Self Care | Payer: Medicaid Other | Attending: Emergency Medicine | Admitting: Emergency Medicine

## 2012-07-29 DIAGNOSIS — S6000XA Contusion of unspecified finger without damage to nail, initial encounter: Secondary | ICD-10-CM | POA: Insufficient documentation

## 2012-07-29 DIAGNOSIS — I2699 Other pulmonary embolism without acute cor pulmonale: Secondary | ICD-10-CM | POA: Insufficient documentation

## 2012-07-29 DIAGNOSIS — J45909 Unspecified asthma, uncomplicated: Secondary | ICD-10-CM | POA: Insufficient documentation

## 2012-07-29 DIAGNOSIS — Z7901 Long term (current) use of anticoagulants: Secondary | ICD-10-CM | POA: Insufficient documentation

## 2012-07-29 DIAGNOSIS — F17200 Nicotine dependence, unspecified, uncomplicated: Secondary | ICD-10-CM | POA: Insufficient documentation

## 2012-07-29 DIAGNOSIS — F3289 Other specified depressive episodes: Secondary | ICD-10-CM | POA: Insufficient documentation

## 2012-07-29 DIAGNOSIS — Z79899 Other long term (current) drug therapy: Secondary | ICD-10-CM | POA: Insufficient documentation

## 2012-07-29 DIAGNOSIS — F209 Schizophrenia, unspecified: Secondary | ICD-10-CM | POA: Insufficient documentation

## 2012-07-29 DIAGNOSIS — Z8709 Personal history of other diseases of the respiratory system: Secondary | ICD-10-CM | POA: Insufficient documentation

## 2012-07-29 DIAGNOSIS — F329 Major depressive disorder, single episode, unspecified: Secondary | ICD-10-CM | POA: Insufficient documentation

## 2012-07-29 DIAGNOSIS — Z862 Personal history of diseases of the blood and blood-forming organs and certain disorders involving the immune mechanism: Secondary | ICD-10-CM | POA: Insufficient documentation

## 2012-07-29 DIAGNOSIS — I82629 Acute embolism and thrombosis of deep veins of unspecified upper extremity: Secondary | ICD-10-CM | POA: Insufficient documentation

## 2012-07-29 NOTE — Telephone Encounter (Signed)
Pt calls and states she went to ed for finger contusion and was told to use ibuprofen for pain, she states since she is on coumadin should she use this, could you call her at [phone]?
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
## 2012-07-30 MED ORDER — CELECOXIB 100 MG PO CAPS: 100.0000 mg | ORAL_CAPSULE | Freq: Two times a day (BID) | ORAL | Status: DC

## 2012-07-30 NOTE — Telephone Encounter (Signed)
I called her back and advised her to use Cox- 2 inhibitors which has less bleeding risks with coumadin.  Sent new prescription to her pharmacy. Advised her to continue using ice.  Thanks, [employer]
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
## 2012-08-03 NOTE — ED Provider Notes (Signed)
Medical screening examination/treatment/procedure(s) were performed by non-physician practitioner and as supervising physician I was immediately available for consultation/collaboration.  Merrin Mcvicker T Brentney Goldbach, MD 08/03/12 0717 

## 2012-08-12 ENCOUNTER — Emergency Department (HOSPITAL_COMMUNITY)
Admission: EM | Admit: 2012-08-12 | Discharge: 2012-08-12 | Disposition: A | Discharge status: Home / Self Care | Payer: Medicaid Other | Attending: Emergency Medicine | Admitting: Emergency Medicine

## 2012-08-12 ENCOUNTER — Encounter (HOSPITAL_COMMUNITY): Payer: Self-pay | Admitting: Emergency Medicine

## 2012-08-12 DIAGNOSIS — R109 Unspecified abdominal pain: Secondary | ICD-10-CM

## 2012-08-12 DIAGNOSIS — Z86711 Personal history of pulmonary embolism: Secondary | ICD-10-CM | POA: Insufficient documentation

## 2012-08-12 DIAGNOSIS — F17200 Nicotine dependence, unspecified, uncomplicated: Secondary | ICD-10-CM | POA: Insufficient documentation

## 2012-08-12 DIAGNOSIS — Z79899 Other long term (current) drug therapy: Secondary | ICD-10-CM | POA: Insufficient documentation

## 2012-08-12 DIAGNOSIS — R791 Abnormal coagulation profile: Secondary | ICD-10-CM

## 2012-08-12 DIAGNOSIS — Z9851 Tubal ligation status: Secondary | ICD-10-CM | POA: Insufficient documentation

## 2012-08-12 DIAGNOSIS — Z862 Personal history of diseases of the blood and blood-forming organs and certain disorders involving the immune mechanism: Secondary | ICD-10-CM | POA: Insufficient documentation

## 2012-08-12 DIAGNOSIS — Z9889 Other specified postprocedural states: Secondary | ICD-10-CM | POA: Insufficient documentation

## 2012-08-12 DIAGNOSIS — F329 Major depressive disorder, single episode, unspecified: Secondary | ICD-10-CM | POA: Insufficient documentation

## 2012-08-12 DIAGNOSIS — F3289 Other specified depressive episodes: Secondary | ICD-10-CM | POA: Insufficient documentation

## 2012-08-12 DIAGNOSIS — Z8709 Personal history of other diseases of the respiratory system: Secondary | ICD-10-CM | POA: Insufficient documentation

## 2012-08-12 DIAGNOSIS — Z3202 Encounter for pregnancy test, result negative: Secondary | ICD-10-CM | POA: Insufficient documentation

## 2012-08-12 DIAGNOSIS — Z86718 Personal history of other venous thrombosis and embolism: Secondary | ICD-10-CM | POA: Insufficient documentation

## 2012-08-12 DIAGNOSIS — Z7901 Long term (current) use of anticoagulants: Secondary | ICD-10-CM | POA: Insufficient documentation

## 2012-08-12 DIAGNOSIS — F209 Schizophrenia, unspecified: Secondary | ICD-10-CM | POA: Insufficient documentation

## 2012-08-12 DIAGNOSIS — R110 Nausea: Secondary | ICD-10-CM | POA: Insufficient documentation

## 2012-08-12 LAB — BASIC METABOLIC PANEL
CO2: 23 mEq/L (ref 19–32)
Calcium: 8.7 mg/dL (ref 8.4–10.5)
Creatinine, Ser: 0.86 mg/dL (ref 0.50–1.10)
GFR calc non Af Amer: 85 mL/min — ABNORMAL LOW (ref >90)

## 2012-08-12 LAB — WET PREP, GENITAL: Trich, Wet Prep: NONE SEEN

## 2012-08-12 LAB — URINALYSIS, ROUTINE W REFLEX MICROSCOPIC
Glucose, UA: NEGATIVE mg/dL
Ketones, ur: NEGATIVE mg/dL
Leukocytes, UA: NEGATIVE
Protein, ur: NEGATIVE mg/dL
Urobilinogen, UA: 0.2 mg/dL (ref 0.0–1.0)

## 2012-08-12 LAB — CBC WITH DIFFERENTIAL/PLATELET
Basophils Absolute: 0 10*3/uL (ref 0.0–0.1)
Eosinophils Absolute: 0.1 10*3/uL (ref 0.0–0.7)
Eosinophils Relative: 2 % (ref 0–5)
Lymphs Abs: 1.5 10*3/uL (ref 0.7–4.0)
MCH: 22.1 pg — ABNORMAL LOW (ref 26.0–34.0)
MCHC: 31.8 g/dL (ref 30.0–36.0)
MCV: 69.5 fL — ABNORMAL LOW (ref 78.0–100.0)
Monocytes Absolute: 0.3 10*3/uL (ref 0.1–1.0)
Neutrophils Relative %: 44 % (ref 43–77)
Platelets: 219 10*3/uL (ref 150–400)
RDW: 17.3 % — ABNORMAL HIGH (ref 11.5–15.5)

## 2012-08-12 LAB — URINE MICROSCOPIC-ADD ON

## 2012-08-12 MED ORDER — OXYCODONE-ACETAMINOPHEN 5-325 MG PO TABS
1.0000 | ORAL_TABLET | Freq: Once | ORAL | Status: AC
  Administered 2012-08-12: 1 via ORAL
  Filled 2012-08-12: qty 1

## 2012-08-12 MED ORDER — OXYCODONE-ACETAMINOPHEN 5-325 MG PO TABS: 1.0000 | ORAL_TABLET | ORAL | Status: DC | PRN

## 2012-08-12 MED ORDER — ONDANSETRON 4 MG PO TBDP
8.0000 mg | ORAL_TABLET | Freq: Once | ORAL | Status: AC
  Administered 2012-08-12: 8 mg via ORAL
  Filled 2012-08-12: qty 1
  Filled 2012-08-12: qty 2

## 2012-08-12 NOTE — ED Notes (Signed)
Pt. States that 2-3 days ago she started having abdominal pain. Denies N/V/D. Denies fever or chills. States she had her period a few weeks ago.

## 2012-08-12 NOTE — ED Provider Notes (Signed)
History     CSN: [id number]  Arrival date & time 08/12/12  4098   First MD Initiated Contact with Patient 08/12/12 [phone]      Chief Complaint  Patient presents with  . Abdominal Pain    Patient is a 36 y.o. female presenting with abdominal pain. The history is provided by the patient.  Abdominal Pain The primary symptoms of the illness include abdominal pain and nausea. The primary symptoms of the illness do not include fever, fatigue, vomiting, diarrhea, dysuria or vaginal bleeding. The current episode started 2 days ago. The onset of the illness was gradual. The problem has been gradually worsening.  Symptoms associated with the illness do not include chills, frequency or back pain.  PT reports constipation for up to a week (though she had a small BM today) Now reports LLQ abd pain for past 2 days No fever/vomiting She reports mild vag discharge She has no other complaints  Past Medical History  Diagnosis Date  . Pulmonary embolism  September 07, 2005     her CT angiogram - positive for pulmonary emboli to several branches of the right lower lobe- relatively small clot burden, clear lung; patient started on Coumadin; CT angiogram on January 10, 2006 showed resolution of previously seen pulmonary emboli with minimal basilar atelectasis  . Lung nodule  June 10, 2008     stable tiny noduke noted along the minor fissure of the right lung on CT angio September 11, 09 -  stable for 2 years and consistent with benign disease  . Arm DVT (deep venous thromboembolism), acute  September 23, 2009, January 05, 2010     Doppler study significant with indeterminant age DVT involving the left upper extremity, Doppler performed January 05, 2010 consistent with acute DVT involving the left upper extremity  . Asthma   . Anemia 2007     microcytic anemia, baseline hemoglobin 10-11, MCV at baseline 72-77, secondary to iron deficiency  . Schizophrenia   . Depression   . Leukopenia 2008     unclear  etiology baseline WBC  2.8-3.7    Past Surgical History  Procedure Date  . Cesarean section      History of 5 C-section  . Tubal ligation   . Exploratory laparotomy with abdominal mass excision 02/2005    Family History  Problem Relation Age of Onset  . Hypertension Mother   . Heart disease Mother   . Diabetes Father   . Birth defects Maternal Aunt   . Birth defects Maternal Uncle   . Diabetes Paternal Grandmother     History  Substance Use Topics  . Smoking status: Current Every Day Smoker    Types: Cigarettes  . Smokeless tobacco: Not on file  . Alcohol Use: Yes     Comment: occ beer    OB History    Grav Para Term Preterm Abortions TAB SAB Ect Mult Living   6 5        5       Review of Systems  Constitutional: Negative for fever, chills and fatigue.  Gastrointestinal: Positive for nausea and abdominal pain. Negative for vomiting and diarrhea.  Genitourinary: Negative for dysuria, frequency and vaginal bleeding.  Musculoskeletal: Negative for back pain.  All other systems reviewed and are negative.    Allergies  Review of patient's allergies indicates no known allergies.  Home Medications   Current Outpatient Rx  Name  Route  Sig  Dispense  Refill  . ARIPIPRAZOLE 10 MG PO  TABS   Oral   Take 10 mg by mouth at bedtime.          Marland Kitchen NICOTINE 14 MG/24HR TD PT24   Transdermal   Place 1 patch onto the skin daily.   30 patch   0   . WARFARIN SODIUM 5 MG PO TABS   Oral   Take 12.5 mg by mouth daily.           BP 136/98  Pulse 73  Temp 98 F (36.7 C) (Oral)  Resp 20  SpO2 98%  LMP 07/27/2012  Physical Exam CONSTITUTIONAL: Well developed/well nourished HEAD AND FACE: Normocephalic/atraumatic EYES: EOMI/PERRL ENMT: Mucous membranes moist NECK: supple no meningeal signs SPINE:entire spine nontender CV: S1/S2 noted, no murmurs/rubs/gallops noted LUNGS: Lungs are clear to auscultation bilaterally, no apparent distress ABDOMEN: soft, nontender,  no rebound or guarding GU:no cva tenderness, no cmt, no vag discharge/bleeding.  No adnexal tenderness/mass.  Chaperone present NEURO: Pt is awake/alert, moves all extremitiesx4 EXTREMITIES: pulses normal, full ROM SKIN: warm, color normal PSYCH: no abnormalities of mood noted  ED Course  Procedures   Labs Reviewed  CBC WITH DIFFERENTIAL - Abnormal; Notable for the following:    WBC 3.4 (*)     Hemoglobin 10.5 (*)     HCT 33.0 (*)     MCV 69.5 (*)     MCH 22.1 (*)     RDW 17.3 (*)     Neutro Abs 1.5 (*)     All other components within normal limits  BASIC METABOLIC PANEL - Abnormal; Notable for the following:    GFR calc non Af Amer 85 (*)     All other components within normal limits  URINALYSIS, ROUTINE W REFLEX MICROSCOPIC - Abnormal; Notable for the following:    APPearance CLOUDY (*)     Hgb urine dipstick TRACE (*)     All other components within normal limits  PROTIME-INR - Abnormal; Notable for the following:    Prothrombin Time 17.5 (*)     All other components within normal limits  URINE MICROSCOPIC-ADD ON - Abnormal; Notable for the following:    Squamous Epithelial / LPF MANY (*)     Bacteria, UA MANY (*)     All other components within normal limits  LACTIC ACID, PLASMA  POCT PREGNANCY, URINE  GC/CHLAMYDIA PROBE AMP  WET PREP, GENITAL   10:58 AM Pt with constipation and LLQ pain.  Her exam thus far is unremarkable Will need pelvic and reassessment.  Also, will need to restart her coumadin as subtherapeutic (h/o DVT)  12:47 PM Pt resting comfortably.  Abd/pelvic exam unremarkable.  No signs of bowel obstruction.  No signs of abdominal hernia.  No signs of TOA/torsion Stable for d/c Discussed need for f/u with her PCP for coumadin management  MDM  Nursing notes including past medical history and social history reviewed and considered in documentation Labs/vital reviewed and considered         Joya Gaskins, MD 08/12/12 1248

## 2012-08-13 LAB — GC/CHLAMYDIA PROBE AMP
CT Probe RNA: NEGATIVE
GC Probe RNA: NEGATIVE

## 2012-08-20 ENCOUNTER — Telehealth: Payer: Self-pay | Admitting: *Deleted

## 2012-08-20 NOTE — Telephone Encounter (Signed)
Agree with appt. Would follow up on that.  Thanks, [employer]

## 2012-08-20 NOTE — Telephone Encounter (Signed)
Pt called stating she was seen in ED 11/13 for abd pain.  At that time she was informed her INR was low and she needed to see Dr Alexandria Lodge. I looked up visit and INR was 1.48 ( PT 17.5 )  Pt scheduled for coumadin clinic tomorrow at 10:30

## 2012-08-21 ENCOUNTER — Ambulatory Visit (INDEPENDENT_AMBULATORY_CARE_PROVIDER_SITE_OTHER): Payer: Medicaid Other

## 2012-08-21 ENCOUNTER — Encounter: Payer: Self-pay | Admitting: Internal Medicine

## 2012-08-21 ENCOUNTER — Ambulatory Visit (INDEPENDENT_AMBULATORY_CARE_PROVIDER_SITE_OTHER): Payer: Medicaid Other | Admitting: Internal Medicine

## 2012-08-21 ENCOUNTER — Encounter: Payer: Self-pay | Admitting: *Deleted

## 2012-08-21 VITALS — BP 125/90 | HR 84 | Temp 98.7°F | Ht 64.0 in | Wt 237.4 lb

## 2012-08-21 DIAGNOSIS — R040 Epistaxis: Secondary | ICD-10-CM

## 2012-08-21 DIAGNOSIS — F17200 Nicotine dependence, unspecified, uncomplicated: Secondary | ICD-10-CM

## 2012-08-21 DIAGNOSIS — J45909 Unspecified asthma, uncomplicated: Secondary | ICD-10-CM

## 2012-08-21 DIAGNOSIS — R791 Abnormal coagulation profile: Secondary | ICD-10-CM

## 2012-08-21 DIAGNOSIS — Z7901 Long term (current) use of anticoagulants: Secondary | ICD-10-CM

## 2012-08-21 DIAGNOSIS — I82409 Acute embolism and thrombosis of unspecified deep veins of unspecified lower extremity: Secondary | ICD-10-CM

## 2012-08-21 LAB — POCT INR: INR: 1.8

## 2012-08-21 MED ORDER — HYPROMELLOSE (GONIOSCOPIC) 2.5 % OP SOLN: 1.0000 [drp] | Freq: Three times a day (TID) | OPHTHALMIC | Status: DC | PRN

## 2012-08-21 NOTE — Patient Instructions (Addendum)
Please use a humidifier to help with your dry nose while it is winter to avoid future nosebleeds. If you have a nosebleed that lasts longer than 5-10 minutes please call our clinic or seek medical attention. Please take 15 mg (3 of the pills that are 5 mg) of coumadin (warfarin) on SATURDAY and SUNDAY. Come back and see Dr. Alexandria Lodge on Monday for an INR check. Our number is [phone]. We have sent in some eye drops for your eyes that you can use as needed.

## 2012-08-21 NOTE — Progress Notes (Signed)
Pt presents for coumadin appt, dr Alexandria Lodge is not in today, lab has done inr, i spoke to dr Meredith Pel, he states pt needs to be seen this am if possible, ED f/u and inr, coumadin check- will speak to dr Josem Kaufmann for possible visit, spoke w/ dr Josem Kaufmann, pt will return for pm visit w/ dr Dorise Hiss per Charlynn Grimes.

## 2012-08-21 NOTE — Progress Notes (Signed)
Subjective:     Patient ID: Michele Gillespie, female   DOB: [birth year], 36 y.o.   MRN: [id number]  HPI The patient is a 36 year old female who comes in today for an acute visit for a followup of a Coumadin value. Her INR was noted to be 1.8 this morning. She states that she was unaware that it was low when checked at an emergency department visit last week for abdominal pain. The abdominal pain was related to constipation and has resolved at this time. She states that the reason she has not been back to clinic in some time is the loss of her mother and 2 sisters recently. She has started smoking again since their deaths. She states that she is unable to think about quitting at this time due to the holidays and increased stress. She states that she normally takes 2-1/2 tablets of warfarin every day which is 12.5 mg daily. She used to see Dr. Alexandria Lodge and this is her last known dose with him. She states that she had one nosebleed yesterday which lasted less than 5 minutes. She states that in the preceding week she's had a lot of drainage and has felt "clogged up". She does not usually have nosebleeds. She is not feeling dizzy or lightheaded. She does not have any complaints at today's visit. She states that she does not take Abilify except when she thinks she needs it and states that the Percocet that she got from the ED was 5 pills and they are gone and she has not taken anything since. She would like some eyedrops as she feels like her eyes are dry at times.  Review of Systems  Constitutional: Negative for fever, chills, diaphoresis, activity change, appetite change, fatigue and unexpected weight change.  HENT: Positive for nosebleeds.   Respiratory: Negative for cough, chest tightness, shortness of breath and wheezing.   Cardiovascular: Negative for chest pain, palpitations and leg swelling.  Gastrointestinal: Positive for constipation. Negative for nausea, vomiting, abdominal pain, diarrhea and abdominal  distention.  Musculoskeletal: Negative.   Skin: Negative.   Neurological: Negative for dizziness, tremors, syncope, facial asymmetry, speech difficulty, weakness, light-headedness, numbness and headaches.  Hematological: Negative for adenopathy.  Psychiatric/Behavioral: Negative.        Objective:   Physical Exam  Constitutional: She is oriented to person, place, and time. She appears well-developed and well-nourished. No distress.  HENT:  Head: Normocephalic and atraumatic.  Eyes: EOM are normal. Pupils are equal, round, and reactive to light. Right eye exhibits no discharge.  Neck: Normal range of motion. Neck supple. No JVD present. No thyromegaly present.  Cardiovascular: Normal rate and regular rhythm.   No murmur heard. Pulmonary/Chest: Effort normal and breath sounds normal. No respiratory distress. She has no wheezes. She has no rales. She exhibits no tenderness.  Abdominal: Soft. Bowel sounds are normal. She exhibits no distension. There is no rebound and no guarding.  Musculoskeletal: Normal range of motion. She exhibits no edema and no tenderness.  Neurological: She is alert and oriented to person, place, and time. No cranial nerve deficit.  Skin: Skin is warm and dry. She is not diaphoretic.       Assessment/Plan:   1. Subtherapeutic INR-her INR at today's visit is 1.8. We'll have her increase dosing to 3 of the 5 mg pills of Coumadin on Saturday and Sunday. She will be seen back with Dr. Alexandria Lodge on Monday 08/24/2012.   2. Nosebleed-likely due to increased heating with cooler temperatures. Advised her to  use humidifier in her room at nighttime to help with dry skin. Advised her she has nosebleeds lasting greater than 5-10 minutes she should seek immediate medical attention.  3. Asthma-the patient states that since resuming smoking she has had a few episodes of wheezing however she has not sought medical attention for these and does not feel that they're impacting her life.  She has albuterol inhaler at home.   4. Tobacco abuse-the patient is a current smoker and does not wish to quit at this time. She has had multiple deaths in the family that have caused her to resume smoking.  5. Disposition-the patient will be seen back on Monday with Dr. Alexandria Lodge. She will be seen back as previously scheduled with her PCP. Patient did decline flu shot at this visit.

## 2012-08-24 ENCOUNTER — Ambulatory Visit (INDEPENDENT_AMBULATORY_CARE_PROVIDER_SITE_OTHER): Payer: Medicaid Other | Admitting: Pharmacist

## 2012-08-24 DIAGNOSIS — Z7901 Long term (current) use of anticoagulants: Secondary | ICD-10-CM

## 2012-08-24 DIAGNOSIS — I82409 Acute embolism and thrombosis of unspecified deep veins of unspecified lower extremity: Secondary | ICD-10-CM

## 2012-08-24 DIAGNOSIS — D6859 Other primary thrombophilia: Secondary | ICD-10-CM

## 2012-08-24 MED ORDER — WARFARIN SODIUM 5 MG PO TABS: ORAL_TABLET | ORAL | Status: DC

## 2012-08-24 NOTE — Progress Notes (Signed)
Anti-Coagulation Progress Note  Michele Gillespie is a 36 y.o. female who is currently on an anti-coagulation regimen.    RECENT RESULTS: Recent results are below, the most recent result is correlated with a dose of 90 mg. per week: Lab Results  Component Value Date   INR 2.3 08/24/2012   INR 1.8 08/21/2012   INR 1.48 08/12/2012    ANTI-COAG DOSE:   Latest dosing instructions   Total Sun Mon Tue Wed Thu Fri Sat   92.5 12.5 mg 15 mg 12.5 mg 12.5 mg 15 mg 12.5 mg 12.5 mg    (5 mg2.5) (5 mg3) (5 mg2.5) (5 mg2.5) (5 mg3) (5 mg2.5) (5 mg2.5)         ANTICOAG SUMMARY: Anticoagulation Episode Summary              Current INR goal 2.0-3.0 Next INR check 09/14/2012   INR from last check 2.3 (08/24/2012)     Weekly max dose (mg)  Target end date Indefinite   Indications HYPERCOAGULABLE STATE, PRIMARY [289.81], Long term current use of anticoagulant [V58.61]   INR check location [facility] Preferred lab    Send INR reminders to    Comments             ANTICOAG TODAY: Anticoagulation Summary as of 08/24/2012              INR goal 2.0-3.0     Selected INR 2.3 (08/24/2012) Next INR check 09/14/2012   Weekly max dose (mg)  Target end date Indefinite   Indications HYPERCOAGULABLE STATE, PRIMARY [289.81], Long term current use of anticoagulant [V58.61]    Anticoagulation Episode Summary              INR check location [facility] Preferred lab    Send INR reminders to    Comments             PATIENT INSTRUCTIONS: Patient Instructions  Patient instructed to take medications as defined in the Anti-coagulation Track section of this encounter.  Patient instructed to take today's dose.  Patient verbalized understanding of these instructions.        FOLLOW-UP Return in 3 weeks (on 09/14/2012) for Follow up INR at 1030h.  Hulen Luster, III Pharm.D., CACP    Anti-Coagulation Progress Note  Michele Gillespie is a 36 y.o. female who is currently on an  anti-coagulation regimen.    RECENT RESULTS: Recent results are below, the most recent result is correlated with a dose of 90 mg. per week: Lab Results  Component Value Date   INR 2.3 08/24/2012   INR 1.8 08/21/2012   INR 1.48 08/12/2012    ANTI-COAG DOSE:   Latest dosing instructions   Total Sun Mon Tue Wed Thu Fri Sat   92.5 12.5 mg 15 mg 12.5 mg 12.5 mg 15 mg 12.5 mg 12.5 mg    (5 mg2.5) (5 mg3) (5 mg2.5) (5 mg2.5) (5 mg3) (5 mg2.5) (5 mg2.5)         ANTICOAG SUMMARY: Anticoagulation Episode Summary              Current INR goal 2.0-3.0 Next INR check 09/14/2012   INR from last check 2.3 (08/24/2012)     Weekly max dose (mg)  Target end date Indefinite   Indications HYPERCOAGULABLE STATE, PRIMARY [289.81], Long term current use of anticoagulant [V58.61]   INR check location [facility] Preferred lab    Send INR reminders to    Comments  ANTICOAG TODAY: Anticoagulation Summary as of 08/24/2012              INR goal 2.0-3.0     Selected INR 2.3 (08/24/2012) Next INR check 09/14/2012   Weekly max dose (mg)  Target end date Indefinite   Indications HYPERCOAGULABLE STATE, PRIMARY [289.81], Long term current use of anticoagulant [V58.61]    Anticoagulation Episode Summary              INR check location [facility] Preferred lab    Send INR reminders to    Comments             PATIENT INSTRUCTIONS: Patient Instructions  Patient instructed to take medications as defined in the Anti-coagulation Track section of this encounter.  Patient instructed to take today's dose.  Patient verbalized understanding of these instructions.        FOLLOW-UP Return in 3 weeks (on 09/14/2012) for Follow up INR at 1030h.  Hulen Luster, III Pharm.D., CACP

## 2012-08-24 NOTE — Patient Instructions (Addendum)
Patient instructed to take medications as defined in the Anti-coagulation Track section of this encounter.  Patient instructed to take today's dose.  Patient verbalized understanding of these instructions.    

## 2012-08-26 ENCOUNTER — Other Ambulatory Visit: Payer: Self-pay | Admitting: Internal Medicine

## 2012-08-26 ENCOUNTER — Telehealth: Payer: Self-pay | Admitting: *Deleted

## 2012-08-26 DIAGNOSIS — H04123 Dry eye syndrome of bilateral lacrimal glands: Secondary | ICD-10-CM

## 2012-08-26 MED ORDER — HYPROMELLOSE 0.5 % OP SOLN: 1.0000 [drp] | Freq: Two times a day (BID) | OPHTHALMIC | Status: DC

## 2012-08-26 NOTE — Addendum Note (Signed)
Addended by: Elyse Jarvis on: 08/26/2012 04:59 PM   Modules accepted: Orders

## 2012-08-26 NOTE — Telephone Encounter (Signed)
Sent the script electronically. Thanks, [employer]

## 2012-08-26 NOTE — Telephone Encounter (Signed)
Pharm states they only have 0.5%, if you would like to change please send a new script electronically, thanks

## 2012-09-08 ENCOUNTER — Emergency Department (HOSPITAL_COMMUNITY)
Admission: EM | Admit: 2012-09-08 | Discharge: 2012-09-08 | Disposition: A | Discharge status: Home / Self Care | Payer: Medicaid Other | Attending: Emergency Medicine | Admitting: Emergency Medicine

## 2012-09-08 ENCOUNTER — Encounter (HOSPITAL_COMMUNITY): Payer: Self-pay | Admitting: *Deleted

## 2012-09-08 DIAGNOSIS — F17200 Nicotine dependence, unspecified, uncomplicated: Secondary | ICD-10-CM | POA: Insufficient documentation

## 2012-09-08 DIAGNOSIS — F209 Schizophrenia, unspecified: Secondary | ICD-10-CM | POA: Insufficient documentation

## 2012-09-08 DIAGNOSIS — M79609 Pain in unspecified limb: Secondary | ICD-10-CM

## 2012-09-08 DIAGNOSIS — Z86718 Personal history of other venous thrombosis and embolism: Secondary | ICD-10-CM | POA: Insufficient documentation

## 2012-09-08 DIAGNOSIS — Z86711 Personal history of pulmonary embolism: Secondary | ICD-10-CM | POA: Insufficient documentation

## 2012-09-08 DIAGNOSIS — Z8709 Personal history of other diseases of the respiratory system: Secondary | ICD-10-CM | POA: Insufficient documentation

## 2012-09-08 DIAGNOSIS — J45909 Unspecified asthma, uncomplicated: Secondary | ICD-10-CM | POA: Insufficient documentation

## 2012-09-08 DIAGNOSIS — F329 Major depressive disorder, single episode, unspecified: Secondary | ICD-10-CM | POA: Insufficient documentation

## 2012-09-08 DIAGNOSIS — Z862 Personal history of diseases of the blood and blood-forming organs and certain disorders involving the immune mechanism: Secondary | ICD-10-CM | POA: Insufficient documentation

## 2012-09-08 DIAGNOSIS — F3289 Other specified depressive episodes: Secondary | ICD-10-CM | POA: Insufficient documentation

## 2012-09-08 DIAGNOSIS — Z7901 Long term (current) use of anticoagulants: Secondary | ICD-10-CM | POA: Insufficient documentation

## 2012-09-08 DIAGNOSIS — M79603 Pain in arm, unspecified: Secondary | ICD-10-CM

## 2012-09-08 DIAGNOSIS — Z79899 Other long term (current) drug therapy: Secondary | ICD-10-CM | POA: Insufficient documentation

## 2012-09-08 LAB — CBC WITH DIFFERENTIAL/PLATELET
Basophils Relative: 1 % (ref 0–1)
Eosinophils Relative: 1 % (ref 0–5)
Hemoglobin: 10.5 g/dL — ABNORMAL LOW (ref 12.0–15.0)
Lymphocytes Relative: 41 % (ref 12–46)
Neutrophils Relative %: 46 % (ref 43–77)
RBC: 4.68 MIL/uL (ref 3.87–5.11)

## 2012-09-08 LAB — BASIC METABOLIC PANEL
CO2: 23 mEq/L (ref 19–32)
Chloride: 102 mEq/L (ref 96–112)
GFR calc non Af Amer: >90 mL/min (ref >90)
Glucose, Bld: 103 mg/dL — ABNORMAL HIGH (ref 70–99)
Potassium: 3.6 mEq/L (ref 3.5–5.1)
Sodium: 135 mEq/L (ref 135–145)

## 2012-09-08 LAB — PROTIME-INR: INR: 1.34 (ref 0.00–1.49)

## 2012-09-08 MED ORDER — ONDANSETRON HCL 4 MG/2ML IJ SOLN
4.0000 mg | Freq: Once | INTRAMUSCULAR | Status: AC
  Administered 2012-09-08: 4 mg via INTRAVENOUS
  Filled 2012-09-08: qty 2

## 2012-09-08 MED ORDER — MORPHINE SULFATE 4 MG/ML IJ SOLN
4.0000 mg | Freq: Once | INTRAMUSCULAR | Status: AC
  Administered 2012-09-08: 4 mg via INTRAVENOUS
  Filled 2012-09-08: qty 1

## 2012-09-08 NOTE — Progress Notes (Signed)
Left:  No evidence of DVT or superficial thrombosis.

## 2012-09-08 NOTE — ED Provider Notes (Signed)
History     CSN: [id number]  Arrival date & time 09/08/12  0825   First MD Initiated Contact with Patient 09/08/12 [phone]      Chief Complaint  Patient presents with  . Arm Pain    (Consider location/radiation/quality/duration/timing/severity/associated sxs/prior treatment) HPI Comments: This is a 36 year old female, who presents emergency department with chief complaint of left upper arm pain. Patient describes the pain as sharp. She states that she is currently in 5/10 pain, but that her pain has reached 10 out of 10. She has past medical history remarkable for arm DVT, PE, and leg DVT. She is taking warfarin. She states the pain has been present for the past 2 days. She states that it feels the same as her prior arm DVT. The pain does not radiate.  The history is provided by the patient. No language interpreter was used.    Past Medical History  Diagnosis Date  . Pulmonary embolism  September 07, 2005     her CT angiogram - positive for pulmonary emboli to several branches of the right lower lobe- relatively small clot burden, clear lung; patient started on Coumadin; CT angiogram on January 10, 2006 showed resolution of previously seen pulmonary emboli with minimal basilar atelectasis  . Lung nodule  June 10, 2008     stable tiny noduke noted along the minor fissure of the right lung on CT angio September 11, 09 -  stable for 2 years and consistent with benign disease  . Arm DVT (deep venous thromboembolism), acute  September 23, 2009, January 05, 2010     Doppler study significant with indeterminant age DVT involving the left upper extremity, Doppler performed January 05, 2010 consistent with acute DVT involving the left upper extremity  . Asthma   . Anemia 2007     microcytic anemia, baseline hemoglobin 10-11, MCV at baseline 72-77, secondary to iron deficiency  . Schizophrenia   . Depression   . Leukopenia 2008     unclear etiology baseline WBC  2.8-3.7    Past Surgical History   Procedure Date  . Cesarean section      History of 5 C-section  . Tubal ligation   . Exploratory laparotomy with abdominal mass excision 02/2005    Family History  Problem Relation Age of Onset  . Hypertension Mother   . Heart disease Mother   . Diabetes Father   . Birth defects Maternal Aunt   . Birth defects Maternal Uncle   . Diabetes Paternal Grandmother     History  Substance Use Topics  . Smoking status: Current Every Day Smoker    Types: Cigarettes  . Smokeless tobacco: Not on file  . Alcohol Use: Yes     Comment: occ beer    OB History    Grav Para Term Preterm Abortions TAB SAB Ect Mult Living   6 5        5       Review of Systems  All other systems reviewed and are negative.    Allergies  Review of patient's allergies indicates no known allergies.  Home Medications   Current Outpatient Rx  Name  Route  Sig  Dispense  Refill  . ARIPIPRAZOLE 10 MG PO TABS   Oral   Take 10 mg by mouth at bedtime.          Marland Kitchen NICOTINE 14 MG/24HR TD PT24   Transdermal   Place 1 patch onto the skin daily.   30 patch  0   . PATADAY 0.2 % OP SOLN      PLACE 1 DROP INTO BOTH EYES DAILY.   2.5 mL   1   . WARFARIN SODIUM 5 MG PO TABS      Take 2 and 1/2 tablets on all days of week EXCEPT for Mondays and Thursdays---take 3 tablets on those days of each week.           BP 152/101  Pulse 77  Temp 98.2 F (36.8 C) (Oral)  Resp 20  SpO2 98%  Physical Exam  Nursing note and vitals reviewed. Constitutional: She is oriented to person, place, and time. She appears well-developed and well-nourished.       Obese  HENT:  Head: Normocephalic and atraumatic.  Eyes: Conjunctivae normal and EOM are normal. Pupils are equal, round, and reactive to light.  Neck: Normal range of motion. Neck supple.  Cardiovascular: Normal rate and regular rhythm.  Exam reveals no gallop and no friction rub.   No murmur heard. Pulmonary/Chest: Effort normal and breath sounds  normal. No respiratory distress. She has no wheezes. She has no rales. She exhibits no tenderness.  Abdominal: Soft. Bowel sounds are normal. She exhibits no distension and no mass. There is no tenderness. There is no rebound and no guarding.  Musculoskeletal: Normal range of motion. She exhibits tenderness. She exhibits no edema.       Left upper arm tender to palpation on the medial aspect, distal pulses equal and intact, capillary refill is brisk, arm is warm.  Neurological: She is alert and oriented to person, place, and time.  Skin: Skin is warm and dry.  Psychiatric: She has a normal mood and affect. Her behavior is normal. Judgment and thought content normal.    ED Course  Procedures (including critical care time)  Labs Reviewed - No data to display No results found.  Patient Information       Patient Name  Sex  DOB  SSN    Michele Gillespie, Michele Gillespie  Female  10/23/1975  [id number]             Progress Notes signed by Smiley Houseman at 09/08/12 1036     Author:  Smiley Houseman  Service:  Vascular Lab  Author Type:  Cardiovascular Sonographer   Filed:  09/08/12 1036  Note Time:  09/08/12 1036          Left: No evidence of DVT or superficial thrombosis.    ED ECG REPORT  I personally interpreted this EKG   Date: 09/08/2012   Rate: 76  Rhythm: normal sinus rhythm  QRS Axis: normal  Intervals: normal  ST/T Wave abnormalities: normal  Conduction Disutrbances:none  Narrative Interpretation:   Old EKG Reviewed: none available   1. Long term current use of anticoagulant   2. Arm pain       MDM  36 year old female with arm pain and PMH of arm DVT.  I have discussed the patient with Dr. Lynelle Doctor, I will order labs and U/S.   11:04 AM No evidence of DVT in arm.  11:48 AM Discussed patient with Dr. Lynelle Doctor. Patient may followup with her primary care doctor. Discussed the plan with the patient, she understands and agrees. Patient is stable and ready for  discharge.       Roxy Horseman, PA-C 09/08/12 1149  Roxy Horseman, PA-C 09/08/12 1547

## 2012-09-08 NOTE — ED Notes (Addendum)
Patient with left arm pain for about two days.  States that the last time she had similar pain, she had blood clot to the same arm.  Patient denies any shortness of breath.  Patient states that she missed two days of her warfarin due to financial

## 2012-09-10 NOTE — ED Provider Notes (Signed)
Medical screening examination/treatment/procedure(s) were performed by non-physician practitioner and as supervising physician I was immediately available for consultation/collaboration.   Celene Kras, MD 09/10/12 [phone]

## 2012-09-14 ENCOUNTER — Ambulatory Visit (INDEPENDENT_AMBULATORY_CARE_PROVIDER_SITE_OTHER): Payer: Medicaid Other | Admitting: Pharmacist

## 2012-09-14 DIAGNOSIS — Z7901 Long term (current) use of anticoagulants: Secondary | ICD-10-CM

## 2012-09-14 DIAGNOSIS — D6859 Other primary thrombophilia: Secondary | ICD-10-CM

## 2012-09-14 DIAGNOSIS — I82409 Acute embolism and thrombosis of unspecified deep veins of unspecified lower extremity: Secondary | ICD-10-CM

## 2012-09-14 LAB — POCT INR: INR: 1.6

## 2012-09-14 NOTE — Progress Notes (Signed)
Anti-Coagulation Progress Note  Michele Gillespie is a 36 y.o. female who is currently on an anti-coagulation regimen.    RECENT RESULTS: Recent results are below, the most recent result is correlated with a dose of 92.5 mg. per week: Lab Results  Component Value Date   INR 1.60 09/14/2012   INR 1.34 09/08/2012   INR 2.3 08/24/2012    ANTI-COAG DOSE:   Latest dosing instructions   Total Sun Mon Tue Wed Thu Fri Sat   100 15 mg 15 mg 15 mg 15 mg 12.5 mg 15 mg 12.5 mg    (5 mg3) (5 mg3) (5 mg3) (5 mg3) (5 mg2.5) (5 mg3) (5 mg2.5)         ANTICOAG SUMMARY: Anticoagulation Episode Summary              Current INR goal 2.0-3.0 Next INR check 09/28/2012   INR from last check 1.60! (09/14/2012)     Weekly max dose (mg)  Target end date Indefinite   Indications HYPERCOAGULABLE STATE, PRIMARY [289.81], Long term current use of anticoagulant [V58.61]   INR check location [facility] Preferred lab    Send INR reminders to    Comments             ANTICOAG TODAY: Anticoagulation Summary as of 09/14/2012              INR goal 2.0-3.0     Selected INR 1.60! (09/14/2012) Next INR check 09/28/2012   Weekly max dose (mg)  Target end date Indefinite   Indications HYPERCOAGULABLE STATE, PRIMARY [289.81], Long term current use of anticoagulant [V58.61]    Anticoagulation Episode Summary              INR check location [facility] Preferred lab    Send INR reminders to    Comments             PATIENT INSTRUCTIONS: Patient Instructions  Patient instructed to take medications as defined in the Anti-coagulation Track section of this encounter.  Patient instructed to take today's dose.  Patient verbalized understanding of these instructions.        FOLLOW-UP Return in 2 weeks (on 09/28/2012) for Follow up INR at 0945h.  Hulen Luster, III Pharm.D., CACP

## 2012-09-14 NOTE — Patient Instructions (Signed)
Patient instructed to take medications as defined in the Anti-coagulation Track section of this encounter.  Patient instructed to take today's dose.  Patient verbalized understanding of these instructions.    

## 2012-09-28 ENCOUNTER — Ambulatory Visit (INDEPENDENT_AMBULATORY_CARE_PROVIDER_SITE_OTHER): Payer: Medicaid Other | Admitting: Pharmacist

## 2012-09-28 DIAGNOSIS — D6859 Other primary thrombophilia: Secondary | ICD-10-CM

## 2012-09-28 DIAGNOSIS — I82409 Acute embolism and thrombosis of unspecified deep veins of unspecified lower extremity: Secondary | ICD-10-CM

## 2012-09-28 DIAGNOSIS — Z7901 Long term (current) use of anticoagulants: Secondary | ICD-10-CM

## 2012-09-28 LAB — POCT INR: INR: 3.1

## 2012-09-28 NOTE — Progress Notes (Signed)
Anti-Coagulation Progress Note  Michele Gillespie is a 36 y.o. female who is currently on an anti-coagulation regimen.    RECENT RESULTS: Recent results are below, the most recent result is correlated with a dose of 100 mg. per week: Lab Results  Component Value Date   INR 3.10 09/28/2012   INR 1.60 09/14/2012   INR 1.34 09/08/2012    ANTI-COAG DOSE:   Latest dosing instructions   Total Sun Mon Tue Wed Thu Fri Sat   97.5 15 mg 12.5 mg 15 mg 12.5 mg 15 mg 12.5 mg 15 mg    (5 mg3) (5 mg2.5) (5 mg3) (5 mg2.5) (5 mg3) (5 mg2.5) (5 mg3)         ANTICOAG SUMMARY: Anticoagulation Episode Summary              Current INR goal 2.0-3.0 Next INR check 10/12/2012   INR from last check 3.10! (09/28/2012)     Weekly max dose (mg)  Target end date Indefinite   Indications HYPERCOAGULABLE STATE, PRIMARY [289.81], Long term current use of anticoagulant [V58.61]   INR check location [facility] Preferred lab    Send INR reminders to    Comments             ANTICOAG TODAY: Anticoagulation Summary as of 09/28/2012              INR goal 2.0-3.0     Selected INR 3.10! (09/28/2012) Next INR check 10/12/2012   Weekly max dose (mg)  Target end date Indefinite   Indications HYPERCOAGULABLE STATE, PRIMARY [289.81], Long term current use of anticoagulant [V58.61]    Anticoagulation Episode Summary              INR check location [facility] Preferred lab    Send INR reminders to    Comments             PATIENT INSTRUCTIONS: Patient Instructions  Patient instructed to take medications as defined in the Anti-coagulation Track section of this encounter.  Patient instructed to take today's dose.  Patient verbalized understanding of these instructions.        FOLLOW-UP Return in 2 weeks (on 10/12/2012) for Follow up INR at 0945h.  Hulen Luster, III Pharm.D., CACP

## 2012-09-28 NOTE — Patient Instructions (Signed)
Patient instructed to take medications as defined in the Anti-coagulation Track section of this encounter.  Patient instructed to take today's dose.  Patient verbalized understanding of these instructions.    

## 2012-10-12 ENCOUNTER — Encounter: Payer: Self-pay | Admitting: Internal Medicine

## 2012-10-12 ENCOUNTER — Ambulatory Visit (INDEPENDENT_AMBULATORY_CARE_PROVIDER_SITE_OTHER): Payer: Medicaid Other | Admitting: Internal Medicine

## 2012-10-12 ENCOUNTER — Ambulatory Visit (INDEPENDENT_AMBULATORY_CARE_PROVIDER_SITE_OTHER): Payer: Medicaid Other | Admitting: Pharmacist

## 2012-10-12 ENCOUNTER — Ambulatory Visit: Payer: Medicaid Other

## 2012-10-12 VITALS — BP 125/93 | HR 73 | Temp 97.8°F | Ht 64.0 in | Wt 236.4 lb

## 2012-10-12 DIAGNOSIS — Z7901 Long term (current) use of anticoagulants: Secondary | ICD-10-CM

## 2012-10-12 DIAGNOSIS — I82409 Acute embolism and thrombosis of unspecified deep veins of unspecified lower extremity: Secondary | ICD-10-CM

## 2012-10-12 DIAGNOSIS — J069 Acute upper respiratory infection, unspecified: Secondary | ICD-10-CM

## 2012-10-12 DIAGNOSIS — D72819 Decreased white blood cell count, unspecified: Secondary | ICD-10-CM

## 2012-10-12 DIAGNOSIS — N390 Urinary tract infection, site not specified: Secondary | ICD-10-CM

## 2012-10-12 DIAGNOSIS — D6859 Other primary thrombophilia: Secondary | ICD-10-CM

## 2012-10-12 DIAGNOSIS — H1010 Acute atopic conjunctivitis, unspecified eye: Secondary | ICD-10-CM

## 2012-10-12 DIAGNOSIS — D649 Anemia, unspecified: Secondary | ICD-10-CM

## 2012-10-12 DIAGNOSIS — D509 Iron deficiency anemia, unspecified: Secondary | ICD-10-CM

## 2012-10-12 LAB — IRON AND TIBC
Iron: 25 ug/dL — ABNORMAL LOW (ref 42–145)
TIBC: 453 ug/dL (ref 250–470)
UIBC: 428 ug/dL — ABNORMAL HIGH (ref 125–400)

## 2012-10-12 LAB — POCT URINALYSIS DIPSTICK
Glucose, UA: NEGATIVE
Ketones, UA: NEGATIVE
Spec Grav, UA: 1.02

## 2012-10-12 LAB — CBC
HCT: 32.6 % — ABNORMAL LOW (ref 36.0–46.0)
MCV: 68.3 fL — ABNORMAL LOW (ref 78.0–100.0)
RBC: 4.77 MIL/uL (ref 3.87–5.11)
WBC: 3.5 10*3/uL — ABNORMAL LOW (ref 4.0–10.5)

## 2012-10-12 LAB — POCT INR: INR: 2.2

## 2012-10-12 MED ORDER — NITROFURANTOIN MONOHYD MACRO 100 MG PO CAPS: 100.0000 mg | ORAL_CAPSULE | Freq: Two times a day (BID) | ORAL | Status: DC

## 2012-10-12 MED ORDER — OLOPATADINE HCL 0.2 % OP SOLN: 1.0000 [drp] | Freq: Every day | OPHTHALMIC | Status: DC

## 2012-10-12 MED ORDER — FERROUS SULFATE 325 (65 FE) MG PO TABS: 325.0000 mg | ORAL_TABLET | Freq: Three times a day (TID) | ORAL | Status: DC

## 2012-10-12 NOTE — Patient Instructions (Signed)
Patient instructed to take medications as defined in the Anti-coagulation Track section of this encounter.  Patient instructed to take today's dose.  Patient verbalized understanding of these instructions.    

## 2012-10-12 NOTE — Progress Notes (Signed)
Subjective:   Patient ID: Michele Gillespie female   DOB: [birth year] 37 y.o.   MRN: [id number]  HPI: 37 year old woman with past medical history significant for hypertension, depression, microcytic anemia presents to the clinic for followup smelling urine for last one week.  Patient reports that she started using a new soap that her son brought for her and within 2 days she knew that bacterial infection was coming. She reports having foul-smelling urine for last 4-5 days. She also reports some abdominal discomfort that she describes as feeling queasy. She reports some frequency but denies any urgency, dysuria, fever or chills. She reports that she threw up about 2 weeks ago when she woke up midback she attributes to drinking alcohol the previous night and likely hangover. She reports having some whitish discharge, very minimal that she thinks is normal for her. Denies any vaginal itching.  She reports feeling that she left ear infection - pain in ear for last 1 week, associated with the sensation that there is some drainage in her throat from her ear. Denies any discharge, fevers or chills. Also reports sore throat  She also reports having some itching and occasional swelling in her eyes and was requesting a prescription for her eyedrops.  Past Medical History  Diagnosis Date  . Pulmonary embolism  September 07, 2005     her CT angiogram - positive for pulmonary emboli to several branches of the right lower lobe- relatively small clot burden, clear lung; patient started on Coumadin; CT angiogram on January 10, 2006 showed resolution of previously seen pulmonary emboli with minimal basilar atelectasis  . Lung nodule  June 10, 2008     stable tiny noduke noted along the minor fissure of the right lung on CT angio September 11, 09 -  stable for 2 years and consistent with benign disease  . Arm DVT (deep venous thromboembolism), acute  September 23, 2009, January 05, 2010     Doppler study significant with  indeterminant age DVT involving the left upper extremity, Doppler performed January 05, 2010 consistent with acute DVT involving the left upper extremity  . Asthma   . Anemia 2007     microcytic anemia, baseline hemoglobin 10-11, MCV at baseline 72-77, secondary to iron deficiency  . Schizophrenia   . Depression   . Leukopenia 2008     unclear etiology baseline WBC  2.8-3.7   Family History  Problem Relation Age of Onset  . Hypertension Mother   . Heart disease Mother   . Diabetes Father   . Birth defects Maternal Aunt   . Birth defects Maternal Uncle   . Diabetes Paternal Grandmother    History   Social History  . Marital Status: Married    Spouse Name: N/A    Number of Children: N/A  . Years of Education: N/A   Occupational History  . Not on file.   Social History Main Topics  . Smoking status: Current Every Day Smoker    Types: Cigarettes  . Smokeless tobacco: Not on file  . Alcohol Use: Yes     Comment: occ beer  . Drug Use: Yes    Special: Marijuana  . Sexually Active: Yes    Birth Control/ Protection: None   Other Topics Concern  . 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 HEENT: Denies photophobia, eye pain, redness, hearing loss,, congestion, , rhinorrhea, sneezing, mouth sores, trouble swallowing, neck pain, neck stiffness and tinnitus,  + ear pai, + sore throat, + postnasal drip . Respiratory: Denies SOB, DOE, cough, chest tightness, and wheezing. Cardiovascular: Denies to chest pain, palpitations and leg swelling. Gastrointestinal: Denies nausea, vomiting, abdominal pain, diarrhea, constipation, blood in stool and abdominal distention. Genitourinary: Denies dysuria, urgency, frequency, hematuria, flank pain and  difficulty urinating. Musculoskeletal: Denies myalgias, back pain, joint swelling, arthralgias and gait problem.  Skin: Denies pallor, rash and wound. Neurological: Denies dizziness, seizures, syncope, weakness, light-headedness, numbness and headaches. Hematological: Denies adenopathy, easy bruising, personal or family bleeding history. Psychiatric/Behavioral: Denies suicidal ideation, mood changes, confusion, nervousness, sleep disturbance and agitation.    Current Outpatient Medications: Current Outpatient Prescriptions  Medication Sig Dispense Refill  . ARIPiprazole (ABILIFY) 10 MG tablet Take 10 mg by mouth at bedtime.       . nicotine (NICODERM CQ - DOSED IN MG/24 HOURS) 14 mg/24hr patch Place 1 patch onto the skin daily.  30 patch  0  . Olopatadine HCl (PATADAY) 0.2 % SOLN Place 1 drop into both eyes daily.      Marland Kitchen warfarin (COUMADIN) 5 MG tablet Take 12.5-15 mg by mouth every morning. Take 2 and 1/2 tablets on all days of week EXCEPT for Saturday and Sunday, take 3 tablets.        Allergies: No Known Allergies    Objective:   Physical Exam: Filed Vitals:   10/12/12 1412  BP: 125/93  Pulse: 73  Temp: 97.8 F (36.6 C)    General: Vital signs reviewed and noted. Well-developed, well-nourished, in no acute distress; alert, appropriate and cooperative throughout examination. HEENT: Normocephalic, atraumatic. Left ear appears clean with no drainage or discharge present. TM intact . No pharyngeal erythema or exudates were noted.  Lungs: Normal respiratory effort. Clear to auscultation BL without crackles or wheezes. Heart: RRR. S1 and S2 normal without gallop, murmur, or rubs. Abdomen:BS normoactive. Soft, Nondistended, non-tender.  No masses or organomegaly. Extremities: No pretibial edema.     Assessment & Plan:

## 2012-10-12 NOTE — Assessment & Plan Note (Signed)
Patient presents with symptoms of suprapubic discomfort and foul-smelling urine in the setting of using new vaginal douche. Her dipstick was positive for small leukocytes and trace amount of blood. Her symptoms in that state is consistent with UTI. We'll treat her with 5 day course of Macrobid for acute uncomplicated cystitis.  -Followup urine cultures. If antibiotic would need to be changed, I would call the patient and let her know. -She was advised not to use vaginally douche as it increases the risk of UTI

## 2012-10-12 NOTE — Patient Instructions (Addendum)
General Instructions: Please schedule a follow up appointment in 3 months or sooner if needed . Please bring your medication bottles with your next appointment. Please take your medicines as prescribed. I will call you with your lab results if anything will be abnormal. Do steam inhalation and warm saline gargles to help with ear pain .   Treatment Goals:  Goals (1 Years of Data) as of 10/12/2012    None      Progress Toward Treatment Goals:  Treatment Goal 10/12/2012  Stop smoking smoking more    Self Care Goals & Plans:  Self Care Goal 10/12/2012  Manage my medications take my medicines as prescribed  Eat healthy foods eat fruit for snacks and desserts; eat more vegetables; drink diet soda or water instead of juice or soda; eat foods that are low in salt  Be physically active find an activity I enjoy       Care Management & Community Referrals:  Referral 10/12/2012  Referrals made for care management support none needed

## 2012-10-12 NOTE — Progress Notes (Signed)
Anti-Coagulation Progress Note  Michele Gillespie is a 37 y.o. female who is currently on an anti-coagulation regimen.    RECENT RESULTS: Recent results are below, the most recent result is correlated with a dose of 97.5 mg. per week: Lab Results  Component Value Date   INR 2.20 10/12/2012   INR 3.10 09/28/2012   INR 1.60 09/14/2012    ANTI-COAG DOSE:   Latest dosing instructions   Total Sun Mon Tue Wed Thu Fri Sat   97.5 15 mg 12.5 mg 15 mg 12.5 mg 15 mg 12.5 mg 15 mg    (5 mg3) (5 mg2.5) (5 mg3) (5 mg2.5) (5 mg3) (5 mg2.5) (5 mg3)         ANTICOAG SUMMARY: Anticoagulation Episode Summary              Current INR goal 2.0-3.0 Next INR check 11/09/2012   INR from last check 2.20 (10/12/2012)     Weekly max dose (mg)  Target end date Indefinite   Indications HYPERCOAGULABLE STATE, PRIMARY [289.81], Long term current use of anticoagulant [V58.61]   [facility] check location [facility] Preferred lab    Send INR reminders to    Comments             ANTICOAG TODAY: Anticoagulation Summary as of 10/12/2012              INR goal 2.0-3.0     Selected INR 2.20 (10/12/2012) Next INR check 11/09/2012   Weekly max dose (mg)  Target end date Indefinite   Indications HYPERCOAGULABLE STATE, PRIMARY [289.81], Long term current use of anticoagulant [V58.61]    Anticoagulation Episode Summary              INR check location [facility] Preferred lab    Send INR reminders to    Comments             PATIENT INSTRUCTIONS: Patient Instructions  Patient instructed to take medications as defined in the Anti-coagulation Track section of this encounter.  Patient instructed to take today's dose.  Patient verbalized understanding of these instructions.        FOLLOW-UP Return in 4 weeks (on 11/09/2012) for Follow up INR at 1000h.  Hulen Luster, III Pharm.D., CACP

## 2012-10-13 DIAGNOSIS — J069 Acute upper respiratory infection, unspecified: Secondary | ICD-10-CM | POA: Insufficient documentation

## 2012-10-13 LAB — URINALYSIS, ROUTINE W REFLEX MICROSCOPIC
Bilirubin Urine: NEGATIVE
Glucose, UA: NEGATIVE mg/dL
Hgb urine dipstick: NEGATIVE
Leukocytes, UA: NEGATIVE
Protein, ur: NEGATIVE mg/dL
Urobilinogen, UA: 1 mg/dL (ref 0.0–1.0)

## 2012-10-13 LAB — URINE CULTURE: Colony Count: 30000

## 2012-10-13 NOTE — Assessment & Plan Note (Signed)
Patient was complaining of some ear fullness mostly left-sided postnasal drip and sore throat for last week. On exam no pharyngeal exudates or edema was noted in her left ear exam was benign. I think this most likely viral URI. - Supportive care : Warm saline gargles and Steam Inhalation -Drink plenty of fluids

## 2012-10-13 NOTE — Assessment & Plan Note (Signed)
This could be related to heavy menstrual cycle. Patient reports that her menstrual cycles usually last for 7-8 days and she uses about 3-4 pads a day. She denies any family history of colon cancer and also denies noticing any blood in her stools, recent weight loss. Her transvaginal ultrasound from 06 2012 was reviewed that did not show any evidence of fibroids at that time. -Would start her on iron supplementation for now.  Update: Her results show stable H&H. Continue iron supplementation for now.

## 2012-10-28 ENCOUNTER — Other Ambulatory Visit: Payer: Self-pay | Admitting: Internal Medicine

## 2012-10-28 NOTE — Telephone Encounter (Signed)
Was removed at an admission

## 2012-11-09 ENCOUNTER — Ambulatory Visit: Payer: Medicaid Other

## 2012-12-01 DIAGNOSIS — N898 Other specified noninflammatory disorders of vagina: Secondary | ICD-10-CM | POA: Insufficient documentation

## 2012-12-01 NOTE — Assessment & Plan Note (Signed)
DNA probe sent for Candida, Gardnerella, and Trichomonas.  Will plan to start with Metronidazole 500 mg BID for bacterial vaginosis.  She will follow up after the treatment if she is still symptomatic.

## 2012-12-11 ENCOUNTER — Encounter (HOSPITAL_COMMUNITY): Payer: Self-pay | Admitting: Cardiology

## 2012-12-11 ENCOUNTER — Emergency Department (HOSPITAL_COMMUNITY)
Admission: EM | Admit: 2012-12-11 | Discharge: 2012-12-11 | Disposition: A | Discharge status: Home / Self Care | Payer: Medicaid Other | Attending: Emergency Medicine | Admitting: Emergency Medicine

## 2012-12-11 DIAGNOSIS — Z86718 Personal history of other venous thrombosis and embolism: Secondary | ICD-10-CM | POA: Insufficient documentation

## 2012-12-11 DIAGNOSIS — Z794 Long term (current) use of insulin: Secondary | ICD-10-CM | POA: Insufficient documentation

## 2012-12-11 DIAGNOSIS — F209 Schizophrenia, unspecified: Secondary | ICD-10-CM | POA: Insufficient documentation

## 2012-12-11 DIAGNOSIS — Z79899 Other long term (current) drug therapy: Secondary | ICD-10-CM | POA: Insufficient documentation

## 2012-12-11 DIAGNOSIS — D649 Anemia, unspecified: Secondary | ICD-10-CM | POA: Insufficient documentation

## 2012-12-11 DIAGNOSIS — F329 Major depressive disorder, single episode, unspecified: Secondary | ICD-10-CM | POA: Insufficient documentation

## 2012-12-11 DIAGNOSIS — F17200 Nicotine dependence, unspecified, uncomplicated: Secondary | ICD-10-CM | POA: Insufficient documentation

## 2012-12-11 DIAGNOSIS — J069 Acute upper respiratory infection, unspecified: Secondary | ICD-10-CM

## 2012-12-11 DIAGNOSIS — R059 Cough, unspecified: Secondary | ICD-10-CM | POA: Insufficient documentation

## 2012-12-11 DIAGNOSIS — Z8709 Personal history of other diseases of the respiratory system: Secondary | ICD-10-CM | POA: Insufficient documentation

## 2012-12-11 DIAGNOSIS — F3289 Other specified depressive episodes: Secondary | ICD-10-CM | POA: Insufficient documentation

## 2012-12-11 DIAGNOSIS — J45909 Unspecified asthma, uncomplicated: Secondary | ICD-10-CM | POA: Insufficient documentation

## 2012-12-11 DIAGNOSIS — R05 Cough: Secondary | ICD-10-CM | POA: Insufficient documentation

## 2012-12-11 DIAGNOSIS — Z86711 Personal history of pulmonary embolism: Secondary | ICD-10-CM | POA: Insufficient documentation

## 2012-12-11 DIAGNOSIS — Z862 Personal history of diseases of the blood and blood-forming organs and certain disorders involving the immune mechanism: Secondary | ICD-10-CM | POA: Insufficient documentation

## 2012-12-11 DIAGNOSIS — R599 Enlarged lymph nodes, unspecified: Secondary | ICD-10-CM | POA: Insufficient documentation

## 2012-12-11 LAB — RAPID STREP SCREEN (MED CTR MEBANE ONLY): Streptococcus, Group A Screen (Direct): NEGATIVE

## 2012-12-11 NOTE — ED Provider Notes (Signed)
History     CSN: [id number]  Arrival date & time 12/11/12  1324   First MD Initiated Contact with Patient 12/11/12 1402      Chief Complaint  Patient presents with  . Sore Throat  . Cough    (Consider location/radiation/quality/duration/timing/severity/associated sxs/prior treatment) Patient is a 37 y.o. female presenting with pharyngitis and cough. The history is provided by the patient. No language interpreter was used.  Sore Throat This is a new problem. The current episode started in the past 7 days. The problem occurs 2 to 4 times per day. The problem has been gradually worsening. Associated symptoms include coughing and a sore throat. Pertinent negatives include no fever. The symptoms are aggravated by swallowing. She has tried rest for the symptoms. The treatment provided no relief.  Cough Associated symptoms: sore throat   Associated symptoms: no fever and no shortness of breath     Past Medical History  Diagnosis Date  . Pulmonary embolism  September 07, 2005     her CT angiogram - positive for pulmonary emboli to several branches of the right lower lobe- relatively small clot burden, clear lung; patient started on Coumadin; CT angiogram on January 10, 2006 showed resolution of previously seen pulmonary emboli with minimal basilar atelectasis  . Lung nodule  June 10, 2008     stable tiny noduke noted along the minor fissure of the right lung on CT angio September 11, 09 -  stable for 2 years and consistent with benign disease  . Arm DVT (deep venous thromboembolism), acute  September 23, 2009, January 05, 2010     Doppler study significant with indeterminant age DVT involving the left upper extremity, Doppler performed January 05, 2010 consistent with acute DVT involving the left upper extremity  . Asthma   . Anemia 2007     microcytic anemia, baseline hemoglobin 10-11, MCV at baseline 72-77, secondary to iron deficiency  . Schizophrenia   . Depression   . Leukopenia 2008   unclear etiology baseline WBC  2.8-3.7    Past Surgical History  Procedure Laterality Date  . Cesarean section       History of 5 C-section  . Tubal ligation    . Exploratory laparotomy with abdominal mass excision  02/2005    Family History  Problem Relation Age of Onset  . Hypertension Mother   . Heart disease Mother   . Diabetes Father   . Birth defects Maternal Aunt   . Birth defects Maternal Uncle   . Diabetes Paternal Grandmother     History  Substance Use Topics  . Smoking status: Current Every Day Smoker    Types: Cigarettes  . Smokeless tobacco: Not on file  . Alcohol Use: Yes     Comment: occ beer    OB History   Grav Para Term Preterm Abortions TAB SAB Ect Mult Living   6 5        5       Review of Systems  Constitutional: Negative for fever.  HENT: Positive for sore throat. Negative for trouble swallowing.   Respiratory: Positive for cough. Negative for shortness of breath.   All other systems reviewed and are negative.    Allergies  Review of patient's allergies indicates no known allergies.  Home Medications   Current Outpatient Rx  Name  Route  Sig  Dispense  Refill  . albuterol (PROVENTIL HFA;VENTOLIN HFA) 108 (90 BASE) MCG/ACT inhaler   Inhalation   Inhale 2 puffs into the  lungs every 6 (six) hours as needed for wheezing. For shortness of breath         . ARIPiprazole (ABILIFY) 10 MG tablet   Oral   Take 10 mg by mouth at bedtime.          . ferrous sulfate 325 (65 FE) MG tablet   Oral   Take 1 tablet (325 mg total) by mouth 3 (three) times daily with meals.   90 tablet   3   . nicotine (NICODERM CQ - DOSED IN MG/24 HOURS) 14 mg/24hr patch   Transdermal   Place 1 patch onto the skin daily.   30 patch   0   . Olopatadine HCl (PATADAY) 0.2 % SOLN               . warfarin (COUMADIN) 5 MG tablet   Oral   Take 12.5-15 mg by mouth every morning. Take 2 and 1/2 tablets on all days of week EXCEPT for Saturday and Sunday, take  3 tablets.         Marland Kitchen EXPIRED: ferrous sulfate 325 (65 FE) MG tablet   Oral   Take 1 tablet (325 mg total) by mouth 3 (three) times daily with meals.   90 tablet   3     BP 125/80  Pulse 94  Temp(Src) 98.8 F (37.1 C) (Oral)  Resp 18  SpO2 98%  Physical Exam  Nursing note and vitals reviewed. Constitutional: She is oriented to person, place, and time. She appears well-developed and well-nourished.  HENT:  Head: Normocephalic.  Right Ear: External ear normal.  Left Ear: External ear normal.  Oropharyngeal erythema without exudate  Eyes: Pupils are equal, round, and reactive to light.  Neck: Normal range of motion.  Cardiovascular: Normal rate and regular rhythm.   Pulmonary/Chest: Effort normal and breath sounds normal.  Abdominal: Soft. Bowel sounds are normal.  Musculoskeletal: She exhibits no edema and no tenderness.  Lymphadenopathy:    She has cervical adenopathy.  Neurological: She is alert and oriented to person, place, and time.  Skin: Skin is warm and dry.  Psychiatric: She has a normal mood and affect. Her behavior is normal. Judgment and thought content normal.    ED Course  Procedures (including critical care time)  Labs Reviewed  RAPID STREP SCREEN   No results found.   No diagnosis found.  Viral URI with cough.  Discussed symptomatic treatment options.  MDM          Jimmye Norman, NP 12/11/12 2012

## 2012-12-11 NOTE — ED Provider Notes (Signed)
Medical screening examination/treatment/procedure(s) were performed by non-physician practitioner and as supervising physician I was immediately available for consultation/collaboration.  Flint Melter, MD 12/11/12 2059

## 2012-12-11 NOTE — ED Notes (Signed)
C/o sore throat x 2 days. Also states she has left ear pain for "years".

## 2012-12-11 NOTE — ED Notes (Signed)
Pt reports sore throat and cough with congestion over the past couple of days.

## 2012-12-12 ENCOUNTER — Other Ambulatory Visit: Payer: Self-pay | Admitting: Internal Medicine

## 2012-12-12 LAB — STREP A DNA PROBE: Group A Strep Probe: NEGATIVE

## 2012-12-14 MED ORDER — WARFARIN SODIUM 5 MG PO TABS: ORAL_TABLET | ORAL | Status: DC

## 2012-12-21 ENCOUNTER — Ambulatory Visit (INDEPENDENT_AMBULATORY_CARE_PROVIDER_SITE_OTHER): Payer: Medicaid Other | Admitting: Obstetrics & Gynecology

## 2012-12-21 ENCOUNTER — Ambulatory Visit (INDEPENDENT_AMBULATORY_CARE_PROVIDER_SITE_OTHER): Payer: Medicaid Other | Admitting: Pharmacist

## 2012-12-21 ENCOUNTER — Encounter: Payer: Self-pay | Admitting: Obstetrics & Gynecology

## 2012-12-21 VITALS — BP 129/92 | HR 84 | Ht 64.0 in | Wt 224.8 lb

## 2012-12-21 DIAGNOSIS — D6859 Other primary thrombophilia: Secondary | ICD-10-CM

## 2012-12-21 DIAGNOSIS — Z98891 History of uterine scar from previous surgery: Secondary | ICD-10-CM

## 2012-12-21 DIAGNOSIS — Z7901 Long term (current) use of anticoagulants: Secondary | ICD-10-CM

## 2012-12-21 DIAGNOSIS — Z9889 Other specified postprocedural states: Secondary | ICD-10-CM

## 2012-12-21 DIAGNOSIS — I82409 Acute embolism and thrombosis of unspecified deep veins of unspecified lower extremity: Secondary | ICD-10-CM

## 2012-12-21 HISTORY — DX: History of uterine scar from previous surgery: Z98.891

## 2012-12-21 MED ORDER — WARFARIN SODIUM 5 MG PO TABS: ORAL_TABLET | ORAL | Status: DC

## 2012-12-21 NOTE — Progress Notes (Signed)
Anti-Coagulation Progress Note  Michele Gillespie is a 37 y.o. female who is currently on an anti-coagulation regimen.    RECENT RESULTS: Recent results are below, the most recent result is correlated with a dose of 97.5 mg. per week: Lab Results  Component Value Date   INR 1.50 12/21/2012   INR 2.20 10/12/2012   INR 3.10 09/28/2012    ANTI-COAG DOSE: Anticoagulation Dose Instructions as of 12/21/2012     [name] Tue Wed Thu Fri Sat   New Dose 15 mg 12.5 mg 15 mg 12.5 mg 15 mg 12.5 mg 15 mg       ANTICOAG SUMMARY: Anticoagulation Episode Summary   Current INR goal 2.0-3.0  Next INR check 01/11/2013  INR from last check 1.50! (12/21/2012)  Weekly max dose   Target end date Indefinite  [facility] location [facility]  Preferred lab   Send INR reminders to    Indications  HYPERCOAGULABLE STATE PRIMARY [289.81] Long term current use of anticoagulant [V58.61]        Comments         ANTICOAG TODAY: Anticoagulation Summary as of 12/21/2012   INR goal 2.0-3.0  Selected INR 1.50! (12/21/2012)  Next INR check 01/11/2013  Target end date Indefinite   Indications  HYPERCOAGULABLE STATE PRIMARY [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  Patient instructed to take today's dose.  Patient verbalized understanding of these instructions.       FOLLOW-UP Return in 3 weeks (on 01/11/2013) for Follow up INR at 1100H.  Hulen Luster, III Pharm.D., CACP

## 2012-12-21 NOTE — Patient Instructions (Signed)
Pelvic Exam A pelvic (gynecologic) exam is an exam of a woman's outer and inner genitals and reproductive organs. At age 39, or before a woman starts to have sexual intercourse, she should have her first pelvic exam. Pelvic exams allow your caregiver to check on normal development and screen for health problems. These exams should be done regularly throughout a woman's life. Usually, a general physical exam is done first. An exam of the breasts is also done. At this visit, you can ask questions about your health, body, menstrual cycles, sex, and birth control methods. Your caregiver will also ask you questions about your health, family health, menstrual periods, immunizations, and if you are sexually active. The information shared between you and your caregiver is kept confidential. REASONS FOR A PELVIC EXAM  Annual exam and Pap test. A Pap test removes cells from the cervix gently with a spatula and a small brush. The cells are tested for infection, precancer, and cancer.  A Pap test is done to screen for cervical cancer.  The first Pap test should be done at age 21.  Between ages 21 and 29, Pap tests are repeated every 2 years.  Beginning at age 30, you are advised to have a Pap test every 3 years as long as your past 3 Pap tests have been normal.  Some women have medical problems that increase the chance of getting cervical cancer. Talk to your caregiver about these problems. It is especially important to talk to your caregiver if a new problem develops soon after your last Pap test. In these cases, your caregiver may recommend more frequent screening and Pap tests.  The above recommendations are the same for women who have or have not gotten the vaccine for HPV (Human Papillomavirus).  If you had a hysterectomy for a problem that was not cancer or a condition that could lead to cancer, then you no longer need Pap tests. However, even if you no longer need a Pap test, a regular exam is a good  idea to make sure no other problems are starting.   If you are between ages 65 and 70, and you have had normal Pap tests going back 10 years, you no longer need Pap tests. However, even if you no longer need a Pap test, a regular exam is a good idea to make sure no other problems are starting.   If you have had past treatment for cervical cancer or a condition that could lead to cancer, you need Pap tests and screening for cancer for at least 20 years after your treatment.  If Pap tests have been discontinued, risk factors (such as a new sexual partner) need to be re-assessed to determine if screening should be resumed.  Some women may need screenings more often if they are at high risk for cervical cancer.  Make sure your female organs are normal and functioning correctly.  Evaluate a mass or other symptoms that suggest a reproductive system cancer.  Explore why you are not able to get pregnant (infertility).  Find a cause for vaginal discharge, itching, or burning.  Get certain types of birth control or start hormone therapy.  Look for causes of urinary incontinence or sexual problems.  Look for signs of sexually transmitted infection (STI).  Follow the progression of labor.  Determine if pregnancy is present or how far advanced the pregnancy is.  You have severe cramps during your menstrual period.  You have pain during sexual intercourse.  You have abnormal   menstrual periods.  You have no menstrual period by the age of 16. PROCEDURE   A pelvic exam is usually painless but may cause mild discomfort.  In unusual circumstances or in young girls, medicines may be used for comfort. A pelvic exam is not done routinely before a girl is sexually active. Special circumstances such as rape, trauma, or medical problems may require an exam.  You will remove all your clothes and will be given a gown. Usually, there is a nurse in the room during the exam and you can have someone  from your family with you also.  The general physical exam will be done first.  Before the pelvic exam starts, the woman lies down on her back on a special table. She puts the heels of her feet into foot rests (stirrups) with her legs apart. A gown, cloth, or paper drape is usually placed over her belly (abdomen) and legs. First, the caregiver checks the normal arrangement of body parts of the outer genitals. This includes the clitoris, vaginal opening, hymen, labia, and the perineal area between the vagina and rectum. The labia are the skin folds surrounding the vaginal opening. The tube that carries urine (urethra) is also examined.  An internal exam is done next. First, the caregiver inserts an instrument called a speculum into the vagina. The speculum has lubricant on it. The speculum helps hold the vaginal walls apart. The caregiver can then examine the vagina and cervix, which is the opening to the womb (uterus). Cultures of any discharge may be taken to check for an infection. A Pap test may be done.  After the internal exam is done, the speculum is removed. The caregiver uses latex gloves with a lubricant on the fingers to gently press against various pelvic organs from inside the vagina while the other hand is on the lower belly. The caregiver will note any tenderness or abnormalities.  If a pelvic exam is done on a woman who is thought to be in labor, her caregiver can check on the baby and how far her cervix has opened.  Following the exam, you will get dressed and can speak with your caregiver.  Ask your caregiver when and how often you should return for future visits. Finding out the results of your test Ask when your test results will be ready. Make sure you get your test results. TO HAVE A HEALTHY LIFESTYLE:  Follow your caregiver's advice regarding follow-up and future visits.  Get the necessary immunizations according to your age and any traveling you may do.  Eat a balanced,  nourishing diet.  Get plenty of rest and sleep.  Exercise regularly.  Maintain a healthy weight.  Do not smoke or take illegal drugs.  Drink alcohol in moderation or not at all.  If you are sexually active, use some form of birth control if you do not plan to get pregnant.  If you are sexually active, practice safe sex by using a condom to protect against sexually transmitted disease (STD).  Get help or counseling if you have emotional problems. Document Released: 12/07/2002 Document Revised: 12/09/2011 Document Reviewed: 12/13/2009 [employer] Patient Information 2013 [employer].  

## 2012-12-21 NOTE — Patient Instructions (Signed)
Patient instructed to take medications as defined in the Anti-coagulation Track section of this encounter.  Patient instructed to take today's dose.  Patient verbalized understanding of these instructions.    

## 2012-12-21 NOTE — Progress Notes (Signed)
Patient ID: Michele Gillespie, female   DOB: [birth year], 37 y.o.   MRN: [id number]  Chief Complaint  Patient presents with  . Enlarged Uterus  . Menorrhagia    has two periods a month sometimes.    HPI Michele Gillespie is a 37 y.o. female.  G6P5 Patient's last menstrual period was 12/16/2012. Menses last 5 days, uses 3 pad a day, normal for her. Sent by [facility] for exam "hard" uterus. H/O 5 CS  HPI  Past Medical History  Diagnosis Date  . Pulmonary embolism  September 07, 2005     her CT angiogram - positive for pulmonary emboli to several branches of the right lower lobe- relatively small clot burden, clear lung; patient started on Coumadin; CT angiogram on January 10, 2006 showed resolution of previously seen pulmonary emboli with minimal basilar atelectasis  . Lung nodule  June 10, 2008     stable tiny noduke noted along the minor fissure of the right lung on CT angio September 11, 09 -  stable for 2 years and consistent with benign disease  . Arm DVT (deep venous thromboembolism), acute  September 23, 2009, January 05, 2010     Doppler study significant with indeterminant age DVT involving the left upper extremity, Doppler performed January 05, 2010 consistent with acute DVT involving the left upper extremity  . Asthma   . Anemia 2007     microcytic anemia, baseline hemoglobin 10-11, MCV at baseline 72-77, secondary to iron deficiency  . Schizophrenia   . Depression   . Leukopenia 2008     unclear etiology baseline WBC  2.8-3.7    Past Surgical History  Procedure Laterality Date  . Cesarean section       History of 5 C-section  . Tubal ligation    . Exploratory laparotomy with abdominal mass excision  02/2005  endometriosis and cicatrix   Family History  Problem Relation Age of Onset  . Hypertension Mother   . Heart disease Mother   . Diabetes Father   . Birth defects Maternal Aunt   . Birth defects Maternal Uncle   . Diabetes Paternal Grandmother     Social  History History  Substance Use Topics  . Smoking status: Current Every Day Smoker    Types: Cigarettes  . Smokeless tobacco: Not on file  . Alcohol Use: Yes     Comment: occ beer    No Known Allergies  Current Outpatient Prescriptions  Medication Sig Dispense Refill  . albuterol (PROVENTIL HFA;VENTOLIN HFA) 108 (90 BASE) MCG/ACT inhaler Inhale 2 puffs into the lungs every 6 (six) hours as needed for wheezing. For shortness of breath      . ARIPiprazole (ABILIFY) 10 MG tablet Take 10 mg by mouth at bedtime.       . nicotine (NICODERM CQ - DOSED IN MG/24 HOURS) 14 mg/24hr patch Place 1 patch onto the skin daily.  30 patch  0  . Olopatadine HCl (PATADAY) 0.2 % SOLN       . warfarin (COUMADIN) 5 MG tablet Take three tablets on Sunday, Tuesday, Thursday, and Saturday. Take two and a half tablets on Monday, Wednesday, Friday.  80 tablet  2  . ferrous sulfate 325 (65 FE) MG tablet Take 1 tablet (325 mg total) by mouth 3 (three) times daily with meals.  90 tablet  3  . ferrous sulfate 325 (65 FE) MG tablet Take 1 tablet (325 mg total) by mouth 3 (three) times daily with meals.  90 tablet  3   No current facility-administered medications for this visit.    Review of Systems Review of Systems  Gastrointestinal: Negative for abdominal pain.  Genitourinary: Positive for vaginal bleeding. Negative for frequency, vaginal discharge, menstrual problem and pelvic pain.    Blood pressure 129/92, pulse 84, height 5\' 4"  (1.626 m), weight 224 lb 12.8 oz (101.969 kg), last menstrual period 12/16/2012.  Physical Exam Physical Exam  Constitutional: She appears well-developed. No distress.  Pulmonary/Chest: Effort normal. No respiratory distress.  Abdominal: She exhibits no mass. There is no tenderness.  Obese, surgical scars  Genitourinary: Vagina normal and uterus normal.  No mass  Skin: Skin is warm and dry.  Psychiatric: She has a normal mood and affect. Her behavior is normal.    Data  Reviewed Referral notes  Assessment    Normal pelvic exam, multiple previous abdominal surgeries     Plan    Reassurance given, routine f/u        ARNOLD,JAMES 12/21/2012, 4:26 PM

## 2012-12-23 ENCOUNTER — Encounter: Payer: Self-pay | Admitting: Internal Medicine

## 2012-12-23 ENCOUNTER — Ambulatory Visit (INDEPENDENT_AMBULATORY_CARE_PROVIDER_SITE_OTHER): Payer: Medicaid Other | Admitting: Internal Medicine

## 2012-12-23 VITALS — BP 129/73 | HR 77 | Temp 97.0°F | Ht 64.0 in | Wt 229.8 lb

## 2012-12-23 DIAGNOSIS — R197 Diarrhea, unspecified: Secondary | ICD-10-CM

## 2012-12-23 MED ORDER — PSYLLIUM 28 % PO PACK: 1.0000 | PACK | Freq: Two times a day (BID) | ORAL | Status: DC

## 2012-12-23 MED ORDER — ALBUTEROL SULFATE HFA 108 (90 BASE) MCG/ACT IN AERS: 2.0000 | INHALATION_SPRAY | Freq: Four times a day (QID) | RESPIRATORY_TRACT | Status: DC | PRN

## 2012-12-23 NOTE — Patient Instructions (Addendum)
We will check you for an infection. We will give you fiber and this should help your bowels become more regular and less frequent. We will get the results back in 1-2 days and will call you. Work on [employer] and water intake.   Come back in 6 months if you are doing well or sooner if you are having problems. Continue following up with Dr. Alexandria Lodge.   Our number is [phone].  Diarrhea Diarrhea is watery poop (stool). The most common cause of diarrhea is a germ. Other causes include:  Food poisoning.  A reaction to medicine. HOME CARE   Drink clear fluids. This can stop you from losing too much body fluid (dehydration).  Drink enough fluids to keep your pee (urine) clear or pale yellow.  Avoid solid foods and dairy products until you start to feel better. Then start eating bland foods, such as:  Bananas.  Rice.  Crackers.  Applesauce.  Dry toast.  Avoid spicy foods, caffeine, and alcohol.  Your doctor may give medicine to help with cramps and watery poop. Take this as told. Avoid these medicines if you have a fever or blood in your poop.  Take your medicine as told. Finish them even if you start to feel better. GET HELP RIGHT AWAY IF:   The watery poop lasts longer than 3 days.  You have a fever.  Your baby is older than 3 months with a rectal temperature of 100.5 F (38.1 C) or higher for more than 1 day.  There is blood in your poop.  You start to throw up (vomit).  You lose too much fluid. MAKE SURE YOU:   Understand these instructions.  Will watch your condition.  Will get help right away if you are not doing well or get worse. Document Released: 03/04/2008 Document Revised: 12/09/2011 Document Reviewed: 03/04/2008 [facility] Patient Information 2013 [place], [place].

## 2012-12-23 NOTE — Progress Notes (Signed)
Subjective:     Patient ID: Michele Gillespie, female   DOB: [birth year], 37 y.o.   MRN: [id number]  Abdominal Pain Associated symptoms include diarrhea. Pertinent negatives include no constipation, fever, nausea or vomiting.   The patient is a 37 YO female who comes in today for a check up and for some diarrhea. She states that the diarrhea has been going on for about 1 month since she had macrobid for a urine infection as well as when she ate at [employer]. She states that they are soft and slightly loose however are not watery. They are not foul smelling and do not float on the water. She has not been losing weight. She has not been having fevers or chills. She is not having decreased energy. She has no chest pain or SOB. She is not having nausea and is not having vomiting. She is not having dark stools.   Review of Systems  Constitutional: Negative for fever, chills, diaphoresis, activity change, appetite change, fatigue and unexpected weight change.  Respiratory: Negative for cough, choking, chest tightness, shortness of breath and wheezing.   Cardiovascular: Negative for chest pain, palpitations and leg swelling.  Gastrointestinal: Positive for abdominal pain and diarrhea. Negative for nausea, vomiting, constipation, blood in stool, abdominal distention, anal bleeding and rectal pain.       Some crampy pain with multiple episodes of diarrhea.  Genitourinary: Negative.   Musculoskeletal: Negative.   Skin: Negative.   Neurological: Negative.        Objective:   Physical Exam  Constitutional: She is oriented to person, place, and time. She appears well-developed and well-nourished. No distress.  Obese  HENT:  Head: Normocephalic and atraumatic.  Eyes: EOM are normal. Pupils are equal, round, and reactive to light.  Neck: Normal range of motion. Neck supple.  Cardiovascular: Normal rate and regular rhythm.   Pulmonary/Chest: Effort normal and breath sounds normal. No respiratory distress.  She has no wheezes. She has no rales. She exhibits no tenderness.  Abdominal: Soft. Bowel sounds are normal. She exhibits no distension. There is no tenderness. There is no rebound.  Obese abdomen  Musculoskeletal: Normal range of motion. She exhibits no edema and no tenderness.  Neurological: She is alert and oriented to person, place, and time. No cranial nerve deficit.  Skin: Skin is warm and dry. She is not diaphoretic.       Assessment/Plan:   1. Diarrhea - Check C dif toxin on stool sample obtained at visit. Will send rx for fiber supplementation. If toxin negative will send rx for imodium. If toxin positive will treat appropriately. Did have non-reactive HIV test in August but may warrant repeat if she is continuing with diarrhea.  2. Sub therapeutic INR - She will continue to follow with Dr. Alexandria Lodge and he has appropriately increased her dosing and will see her back on 01/11/13.  3. Other health maintenance - Patient did decline flu shot at today's visit. BP is normal.She will be seen back in 6 months or prn.

## 2012-12-24 ENCOUNTER — Telehealth: Payer: Self-pay | Admitting: *Deleted

## 2012-12-24 MED ORDER — LOPERAMIDE HCL 2 MG PO TABS: 2.0000 mg | ORAL_TABLET | Freq: Four times a day (QID) | ORAL | Status: DC | PRN

## 2012-12-24 NOTE — Addendum Note (Signed)
Addended by: Genella Mech A on: 12/24/2012 09:27 AM   Modules accepted: Orders

## 2012-12-24 NOTE — Telephone Encounter (Signed)
Attempted to contact pt regarding lab results C-Diff test was negative.  Left message on recorder to have pt call me back regarding lab results.Kingsley Spittle Cassady3/27/20141:48 PM

## 2012-12-26 ENCOUNTER — Emergency Department (HOSPITAL_COMMUNITY)
Admission: EM | Admit: 2012-12-26 | Discharge: 2012-12-26 | Disposition: A | Discharge status: Home / Self Care | Payer: Medicaid Other | Attending: Emergency Medicine | Admitting: Emergency Medicine

## 2012-12-26 ENCOUNTER — Encounter (HOSPITAL_COMMUNITY): Payer: Self-pay

## 2012-12-26 DIAGNOSIS — F209 Schizophrenia, unspecified: Secondary | ICD-10-CM | POA: Insufficient documentation

## 2012-12-26 DIAGNOSIS — R197 Diarrhea, unspecified: Secondary | ICD-10-CM | POA: Insufficient documentation

## 2012-12-26 DIAGNOSIS — Z79899 Other long term (current) drug therapy: Secondary | ICD-10-CM | POA: Insufficient documentation

## 2012-12-26 DIAGNOSIS — F17200 Nicotine dependence, unspecified, uncomplicated: Secondary | ICD-10-CM | POA: Insufficient documentation

## 2012-12-26 DIAGNOSIS — Z3202 Encounter for pregnancy test, result negative: Secondary | ICD-10-CM | POA: Insufficient documentation

## 2012-12-26 DIAGNOSIS — F3289 Other specified depressive episodes: Secondary | ICD-10-CM | POA: Insufficient documentation

## 2012-12-26 DIAGNOSIS — K529 Noninfective gastroenteritis and colitis, unspecified: Secondary | ICD-10-CM

## 2012-12-26 DIAGNOSIS — J45909 Unspecified asthma, uncomplicated: Secondary | ICD-10-CM | POA: Insufficient documentation

## 2012-12-26 DIAGNOSIS — Z862 Personal history of diseases of the blood and blood-forming organs and certain disorders involving the immune mechanism: Secondary | ICD-10-CM | POA: Insufficient documentation

## 2012-12-26 DIAGNOSIS — R112 Nausea with vomiting, unspecified: Secondary | ICD-10-CM | POA: Insufficient documentation

## 2012-12-26 DIAGNOSIS — F329 Major depressive disorder, single episode, unspecified: Secondary | ICD-10-CM | POA: Insufficient documentation

## 2012-12-26 DIAGNOSIS — Z86718 Personal history of other venous thrombosis and embolism: Secondary | ICD-10-CM | POA: Insufficient documentation

## 2012-12-26 DIAGNOSIS — Z86711 Personal history of pulmonary embolism: Secondary | ICD-10-CM | POA: Insufficient documentation

## 2012-12-26 DIAGNOSIS — Z8709 Personal history of other diseases of the respiratory system: Secondary | ICD-10-CM | POA: Insufficient documentation

## 2012-12-26 DIAGNOSIS — Z7901 Long term (current) use of anticoagulants: Secondary | ICD-10-CM | POA: Insufficient documentation

## 2012-12-26 LAB — WET PREP, GENITAL: Yeast Wet Prep HPF POC: NONE SEEN

## 2012-12-26 LAB — COMPREHENSIVE METABOLIC PANEL
Alkaline Phosphatase: 48 U/L (ref 39–117)
BUN: 9 mg/dL (ref 6–23)
Creatinine, Ser: 0.93 mg/dL (ref 0.50–1.10)
GFR calc Af Amer: 89 mL/min — ABNORMAL LOW (ref >90)
Glucose, Bld: 88 mg/dL (ref 70–99)
Potassium: 3.6 mEq/L (ref 3.5–5.1)
Total Protein: 7.4 g/dL (ref 6.0–8.3)

## 2012-12-26 LAB — URINALYSIS, ROUTINE W REFLEX MICROSCOPIC
Glucose, UA: NEGATIVE mg/dL
Leukocytes, UA: NEGATIVE
Specific Gravity, Urine: 1.017 (ref 1.005–1.030)
Urobilinogen, UA: 0.2 mg/dL (ref 0.0–1.0)

## 2012-12-26 LAB — URINE MICROSCOPIC-ADD ON

## 2012-12-26 LAB — CBC WITH DIFFERENTIAL/PLATELET
Basophils Absolute: 0 10*3/uL (ref 0.0–0.1)
Basophils Relative: 1 % (ref 0–1)
Eosinophils Absolute: 0.1 10*3/uL (ref 0.0–0.7)
HCT: 32.5 % — ABNORMAL LOW (ref 36.0–46.0)
Hemoglobin: 10.6 g/dL — ABNORMAL LOW (ref 12.0–15.0)
Lymphocytes Relative: 37 % (ref 12–46)
Lymphs Abs: 1.6 10*3/uL (ref 0.7–4.0)
MCH: 21.5 pg — ABNORMAL LOW (ref 26.0–34.0)
MCHC: 32.6 g/dL (ref 30.0–36.0)
MCV: 66.1 fL — ABNORMAL LOW (ref 78.0–100.0)
Monocytes Absolute: 0.5 10*3/uL (ref 0.1–1.0)
Neutro Abs: 2.2 10*3/uL (ref 1.7–7.7)
RDW: 18.2 % — ABNORMAL HIGH (ref 11.5–15.5)

## 2012-12-26 LAB — POCT PREGNANCY, URINE: Preg Test, Ur: NEGATIVE

## 2012-12-26 LAB — LIPASE, BLOOD: Lipase: 19 U/L (ref 11–59)

## 2012-12-26 MED ORDER — SODIUM CHLORIDE 0.9 % IV BOLUS (SEPSIS)
1000.0000 mL | Freq: Once | INTRAVENOUS | Status: AC
  Administered 2012-12-26: 1000 mL via INTRAVENOUS

## 2012-12-26 MED ORDER — HYDROCODONE-ACETAMINOPHEN 5-325 MG PO TABS: 2.0000 | ORAL_TABLET | ORAL | Status: DC | PRN

## 2012-12-26 MED ORDER — ONDANSETRON 4 MG PO TBDP: ORAL_TABLET | ORAL | Status: DC

## 2012-12-26 MED ORDER — ONDANSETRON HCL 4 MG/2ML IJ SOLN
4.0000 mg | Freq: Once | INTRAMUSCULAR | Status: AC
  Administered 2012-12-26: 4 mg via INTRAVENOUS
  Filled 2012-12-26: qty 2

## 2012-12-26 MED ORDER — MORPHINE SULFATE 4 MG/ML IJ SOLN
4.0000 mg | Freq: Once | INTRAMUSCULAR | Status: AC
  Administered 2012-12-26: 4 mg via INTRAVENOUS
  Filled 2012-12-26: qty 1

## 2012-12-26 NOTE — ED Notes (Signed)
Pt states abdominal pain started 1 month ago.  Pt states "whole stomach hurting".  Pt states she has taken Tylenol with no relief.  Pt seen last week for the same complaint.  Pt presents again today due to pain getting worse.

## 2012-12-26 NOTE — ED Notes (Signed)
Pt won't remove hoodie from under gown.

## 2012-12-26 NOTE — ED Provider Notes (Signed)
History     CSN: [id number]  Arrival date & time 12/26/12  0825   First MD Initiated Contact with Patient 12/26/12 [phone]      Chief Complaint  Patient presents with  . Abdominal Pain    (Consider location/radiation/quality/duration/timing/severity/associated sxs/prior treatment) The history is provided by the patient and medical records. No language interpreter was used.    Michele Gillespie is a 37 y.o. female  with a hx of PE, DVT, asthma, anemia, schizophrenia, depression, leukopenia presents to the Emergency Department complaining of gradual, persistent, progressively worsening abdominal pain onset 1 month ago. Associated symptoms include needing to have a BM after eating, occasional vomiting, persistent diarrhea.  Tylenol makes it better only briefly and nothing makes it worse.  Also cramping and pain subsides after the diarrhea for some time.  Pt states diarrhea is brown or yellow but never bloody.  Emesis is NBNB.  Pt had stool sample taken at [facility] practice last week and was told that all this was normal.  Pt denies fever, chills, headache, chest pain, SOB, weakness, dizziness, vaginal discharge, vaginal bleeding, dysuria, hematuria.     Past Medical History  Diagnosis Date  . Pulmonary embolism  September 07, 2005     her CT angiogram - positive for pulmonary emboli to several branches of the right lower lobe- relatively small clot burden, clear lung; patient started on Coumadin; CT angiogram on January 10, 2006 showed resolution of previously seen pulmonary emboli with minimal basilar atelectasis  . Lung nodule  June 10, 2008     stable tiny noduke noted along the minor fissure of the right lung on CT angio September 11, 09 -  stable for 2 years and consistent with benign disease  . Arm DVT (deep venous thromboembolism), acute  September 23, 2009, January 05, 2010     Doppler study significant with indeterminant age DVT involving the left upper extremity, Doppler performed January 05, 2010 consistent with acute DVT involving the left upper extremity  . Asthma   . Anemia 2007     microcytic anemia, baseline hemoglobin 10-11, MCV at baseline 72-77, secondary to iron deficiency  . Schizophrenia   . Depression   . Leukopenia 2008     unclear etiology baseline WBC  2.8-3.7    Past Surgical History  Procedure Laterality Date  . Cesarean section       History of 5 C-section  . Tubal ligation    . Exploratory laparotomy with abdominal mass excision  02/2005    Family History  Problem Relation Age of Onset  . Hypertension Mother   . Heart disease Mother   . Diabetes Father   . Birth defects Maternal Aunt   . Birth defects Maternal Uncle   . Diabetes Paternal Grandmother     History  Substance Use Topics  . Smoking status: Current Every Day Smoker    Types: Cigarettes  . Smokeless tobacco: Not on file  . Alcohol Use: Yes     Comment: occ beer    OB History   Grav Para Term Preterm Abortions TAB SAB Ect Mult Living   6 5        5       Review of Systems  Constitutional: Negative for fever, diaphoresis, appetite change, fatigue and unexpected weight change.  HENT: Negative for mouth sores, trouble swallowing, neck pain and neck stiffness.   Respiratory: Negative for cough, chest tightness, shortness of breath, wheezing and stridor.   Cardiovascular: Negative for  chest pain and palpitations.  Gastrointestinal: Positive for nausea, vomiting (seldom), abdominal pain and diarrhea. Negative for constipation, blood in stool, abdominal distention and rectal pain.  Genitourinary: Negative for dysuria, urgency, frequency, hematuria, flank pain and difficulty urinating.  Musculoskeletal: Negative for back pain.  Skin: Negative for rash.  Neurological: Negative for weakness.  Hematological: Negative for adenopathy.  Psychiatric/Behavioral: Negative for confusion.  All other systems reviewed and are negative.    Allergies  Review of patient's allergies  indicates no known allergies.  Home Medications   Current Outpatient Rx  Name  Route  Sig  Dispense  Refill  . albuterol (PROVENTIL HFA;VENTOLIN HFA) 108 (90 BASE) MCG/ACT inhaler   Inhalation   Inhale 2 puffs into the lungs every 6 (six) hours as needed for wheezing. For shortness of breath   1 Inhaler   0   . ARIPiprazole (ABILIFY) 10 MG tablet   Oral   Take 10 mg by mouth at bedtime.          . Olopatadine HCl (PATADAY) 0.2 % SOLN   Ophthalmic   Apply 1 drop to eye daily.          Marland Kitchen warfarin (COUMADIN) 5 MG tablet      Take three tablets on Sunday, Tuesday, Thursday, and Saturday. Take two and a half tablets on Monday, Wednesday, Friday.   80 tablet   2   . HYDROcodone-acetaminophen (NORCO/VICODIN) 5-325 MG per tablet   Oral   Take 2 tablets by mouth every 4 (four) hours as needed for pain.   10 tablet   0   . ondansetron (ZOFRAN ODT) 4 MG disintegrating tablet      4mg  ODT q4 hours prn nausea/vomit   4 tablet   0     BP 150/99  Pulse 90  Temp(Src) 97.9 F (36.6 C) (Oral)  Resp 16  SpO2 100%  LMP 12/16/2012  Physical Exam  Nursing note and vitals reviewed. Constitutional: She is oriented to person, place, and time. She appears well-developed and well-nourished.  HENT:  Head: Normocephalic and atraumatic.  Mouth/Throat: Oropharynx is clear and moist.  Eyes: Conjunctivae are normal. Pupils are equal, round, and reactive to light. No scleral icterus.  Cardiovascular: Normal rate, regular rhythm, normal heart sounds and intact distal pulses.  Exam reveals no gallop and no friction rub.   No murmur heard. Pulmonary/Chest: Effort normal and breath sounds normal. No respiratory distress. She has no wheezes. She has no rales. She exhibits no tenderness.  Abdominal: Soft. Normal appearance and bowel sounds are normal. She exhibits no distension and no mass. There is generalized tenderness (mild, diffuse). There is no rigidity, no rebound, no guarding, no CVA  tenderness, no tenderness at McBurney's point and negative Murphy's sign. Hernia confirmed negative in the right inguinal area and confirmed negative in the left inguinal area.  obese  Genitourinary: No labial fusion. There is no rash, tenderness, lesion or injury on the right labia. There is no rash, tenderness, lesion or injury on the left labia. Uterus is not deviated, not enlarged, not fixed and not tender. Cervix exhibits no motion tenderness, no discharge and no friability. Right adnexum displays no mass, no tenderness and no fullness. Left adnexum displays no mass, no tenderness and no fullness. No erythema, tenderness or bleeding around the vagina. No foreign body around the vagina. No signs of injury around the vagina. Vaginal discharge ( scant, thin, white) found.  Lymphadenopathy:    She has no cervical adenopathy.  Right: No inguinal adenopathy present.       Left: No inguinal adenopathy present.  Neurological: She is alert and oriented to person, place, and time. She exhibits normal muscle tone. Coordination normal.  Skin: Skin is warm and dry. No rash noted. No erythema.  Psychiatric: She has a normal mood and affect.    ED Course  Procedures (including critical care time)  Labs Reviewed  WET PREP, GENITAL - Abnormal; Notable for the following:    Clue Cells Wet Prep HPF POC RARE (*)    WBC, Wet Prep HPF POC RARE (*)    All other components within normal limits  CBC WITH DIFFERENTIAL - Abnormal; Notable for the following:    Hemoglobin 10.6 (*)    HCT 32.5 (*)    MCV 66.1 (*)    MCH 21.5 (*)    RDW 18.2 (*)    All other components within normal limits  COMPREHENSIVE METABOLIC PANEL - Abnormal; Notable for the following:    Total Bilirubin 0.2 (*)    GFR calc non Af Amer 76 (*)    GFR calc Af Amer 89 (*)    All other components within normal limits  URINALYSIS, ROUTINE W REFLEX MICROSCOPIC - Abnormal; Notable for the following:    Hgb urine dipstick TRACE (*)     All other components within normal limits  GC/CHLAMYDIA PROBE AMP  LIPASE, BLOOD  URINE MICROSCOPIC-ADD ON  POCT PREGNANCY, URINE   No results found.   1. Chronic diarrhea   2. Tobacco use disorder   3. Long term current use of anticoagulant       MDM  Joneen Caraway presents with persistent diarrhea.  Patient is nontoxic, nonseptic appearing, in no apparent distress.  Patient's pain and other symptoms adequately managed in emergency department.  Fluid bolus given.  Labs, imaging and vitals reviewed.  Patient does not meet the SIRS or Sepsis criteria.  On repeat exam patient does not have a surgical abdomin and there are nor peritoneal signs.  No indication of appendicitis, bowel obstruction, bowel perforation, cholecystitis, diverticulitis, PID or ectopic pregnancy.  Patient discharged home with symptomatic treatment and given strict instructions for follow-up with their primary care physician and gastroenterology.  I have also discussed reasons to return immediately to the ER.  Patient expresses understanding and agrees with plan.           Dahlia Client Ivonne [place], PA-C 12/26/12 1041

## 2012-12-28 ENCOUNTER — Telehealth: Payer: Self-pay | Admitting: *Deleted

## 2012-12-28 NOTE — Telephone Encounter (Signed)
Pt calls and leaves message that she would like a referral to gastro at [facility], please send to your nurse if this is approved

## 2012-12-28 NOTE — ED Provider Notes (Signed)
Medical screening examination/treatment/procedure(s) were performed by non-physician practitioner and as supervising physician I was immediately available for consultation/collaboration.  Toy Baker, MD 12/28/12 [phone]

## 2012-12-28 NOTE — Telephone Encounter (Signed)
I would not approve or disapprove without knowing if she tried any of the things recommended at last visit. If she did not try them then no referral. She needs to try conservative measures first.   Dr. Dorise Hiss

## 2013-01-07 ENCOUNTER — Telehealth (HOSPITAL_COMMUNITY): Payer: Self-pay | Admitting: Emergency Medicine

## 2013-01-07 ENCOUNTER — Telehealth: Payer: Self-pay | Admitting: *Deleted

## 2013-01-07 ENCOUNTER — Emergency Department (HOSPITAL_COMMUNITY)
Admission: EM | Admit: 2013-01-07 | Discharge: 2013-01-07 | Discharge status: Against Medical Advice | Payer: Medicaid Other | Attending: Emergency Medicine | Admitting: Emergency Medicine

## 2013-01-07 ENCOUNTER — Encounter (HOSPITAL_COMMUNITY): Payer: Self-pay | Admitting: *Deleted

## 2013-01-07 DIAGNOSIS — R1033 Periumbilical pain: Secondary | ICD-10-CM | POA: Insufficient documentation

## 2013-01-07 DIAGNOSIS — R197 Diarrhea, unspecified: Secondary | ICD-10-CM | POA: Insufficient documentation

## 2013-01-07 DIAGNOSIS — R109 Unspecified abdominal pain: Secondary | ICD-10-CM

## 2013-01-07 NOTE — Telephone Encounter (Signed)
Pt called with c/o severe abd pain, no change from last visit in clinic on 3/26. Pt went to ED on 3/29 for chronic diarrhea and abd pain and was to f/u with clinic and GI. Today she rates pain 12/10.  Unable to see in clinic so advised ED again. She is again asking for GI referral. Unable to stop diarrhea and abd pain. Pt # [id number]

## 2013-01-07 NOTE — Telephone Encounter (Signed)
Pt again being evaluated in ED for diarrhea and abd pain. Will ask Dr Dorise Hiss for GI referral.

## 2013-01-07 NOTE — ED Notes (Signed)
Pt called again with no response.  To be removed from the system.

## 2013-01-07 NOTE — ED Notes (Signed)
Pt was seen here last week on 3/29 for peri-umbilical pain and diarrhea x 2 months.  She was referred to [facility] who told her they could not see her b/c they had not rcvd referral from her pcp.  She called her pcp last week to place referral, but they are only just sending it today.  In the mean-time, her pain is not getting any better.

## 2013-01-07 NOTE — ED Notes (Signed)
Pt states that she has tried [facility] for followup but cannot get appointment.  I contacted [facility], pt needs referral from PCP due insurance of [place] access. Pt informed.

## 2013-01-08 ENCOUNTER — Ambulatory Visit (INDEPENDENT_AMBULATORY_CARE_PROVIDER_SITE_OTHER): Payer: Medicaid Other | Admitting: Internal Medicine

## 2013-01-08 VITALS — BP 115/84 | HR 80 | Ht 64.0 in | Wt 223.7 lb

## 2013-01-08 DIAGNOSIS — R197 Diarrhea, unspecified: Secondary | ICD-10-CM

## 2013-01-08 DIAGNOSIS — K529 Noninfective gastroenteritis and colitis, unspecified: Secondary | ICD-10-CM

## 2013-01-08 NOTE — Telephone Encounter (Signed)
Did you ask her if she tried any of the things recommended in my last note? If not, fiber and immodium would be my first line options not GI. If she is not agreeable to that please have her seen in our clinic.   Dr. Dorise Hiss

## 2013-01-08 NOTE — Patient Instructions (Signed)
We will send you to the GI doctor for your chronic diarrhea.   We would like you to keep using fiber everyday and imodium as needed.   Our number is [phone] and you can call with questions or problems.   Come back as needed or in 1-2 months.

## 2013-01-10 ENCOUNTER — Encounter: Payer: Self-pay | Admitting: Internal Medicine

## 2013-01-10 NOTE — Progress Notes (Signed)
Subjective:     Patient ID: Michele Gillespie, female   DOB: 23-Mar-1976, 37 y.o.   MRN: [id number]  HPI The patient is a 37 year old female who comes in today for repeat visit of diarrhea with crampy abdominal pain. She has been having these symptoms since about February. She states that they are unchanged however due to my previous recommendations she has started using fiber intermittently. She says when she takes it helps and her diarrhea overall has decreased in frequency from 5-6 times daily to 3 times daily. Her diarrhea is not truly water is however is slightly loose and not fully formed. She has not had any blood in the diarrhea. She's not losing weight. No  fevers or chills at home. She states that the Imodium she hasn't really tried. She would like a referral to GI doctor at today's visit.   Review of Systems  Constitutional: Negative for fever, chills, diaphoresis, activity change, appetite change, fatigue and unexpected weight change.  Respiratory: Negative for cough, choking, chest tightness, shortness of breath and wheezing.   Cardiovascular: Negative for chest pain, palpitations and leg swelling.  Gastrointestinal: Positive for abdominal pain and diarrhea. Negative for nausea, vomiting, constipation, blood in stool, abdominal distention, anal bleeding and rectal pain.       Some crampy pain with about 3 episodes of diarrhea per day.  Genitourinary: Negative.   Musculoskeletal: Negative.   Skin: Negative.   Neurological: Negative.        Objective:   Physical Exam  Constitutional: She is oriented to person, place, and time. She appears well-developed and well-nourished. No distress.  Obese  HENT:  Head: Normocephalic and atraumatic.  Eyes: EOM are normal. Pupils are equal, round, and reactive to light.  Neck: Normal range of motion. Neck supple.  Cardiovascular: Normal rate and regular rhythm.   Pulmonary/Chest: Effort normal and breath sounds normal. No respiratory distress. She  has no wheezes. She has no rales. She exhibits no tenderness.  Abdominal: Soft. Bowel sounds are normal. She exhibits no distension. There is tenderness. There is no rebound.  Obese abdomen with minimal tenderness to palpation.  Musculoskeletal: Normal range of motion. She exhibits no edema and no tenderness.  Neurological: She is alert and oriented to person, place, and time. No cranial nerve deficit.  Skin: Skin is warm and dry. She is not diaphoretic.       Assessment/Plan:   1. Abdominal pain/diarrhea-the patient will be referred to GI Dr. At today's visit. I did encourage her to continue using fiber as this has helped her significantly. Suspect she may have IBS versus IBD. She does not have any acute abdominal pain, peritonitis, acute abdomen.   2. Disposition-the patient will be seen back as needed or with her PCP. She will be referred to GI doctor encouraged her to continue using fiber/Imodium for her diarrhea as this has significantly helped her.

## 2013-01-11 ENCOUNTER — Ambulatory Visit (INDEPENDENT_AMBULATORY_CARE_PROVIDER_SITE_OTHER): Payer: Medicaid Other | Admitting: Pharmacist

## 2013-01-11 DIAGNOSIS — Z7901 Long term (current) use of anticoagulants: Secondary | ICD-10-CM

## 2013-01-11 DIAGNOSIS — I82409 Acute embolism and thrombosis of unspecified deep veins of unspecified lower extremity: Secondary | ICD-10-CM

## 2013-01-11 DIAGNOSIS — D6859 Other primary thrombophilia: Secondary | ICD-10-CM

## 2013-01-11 NOTE — Progress Notes (Signed)
Anti-Coagulation Progress Note  Michele Gillespie is a 37 y.o. female who is currently on an anti-coagulation regimen.    RECENT RESULTS: Recent results are below, the most recent result is correlated with a dose of 97.5 mg. per week: Lab Results  Component Value Date   INR 5.10 01/11/2013   INR 1.50 12/21/2012   INR 2.20 10/12/2012    ANTI-COAG DOSE: Anticoagulation Dose Instructions as of 01/11/2013     [name] Tue Wed Thu Fri Sat   New Dose 12.5 mg 12.5 mg 12.5 mg 12.5 mg 12.5 mg 12.5 mg 12.5 mg       ANTICOAG SUMMARY: Anticoagulation Episode Summary   Current INR goal 2.0-3.0  Next INR check 01/25/2013  INR from last check 5.10! (01/11/2013)  Weekly max dose   Target end date Indefinite  [facility]  Preferred lab   Send INR reminders to    Indications  HYPERCOAGULABLE STATE PRIMARY [289.81] Long term current use of anticoagulant [V58.61]        Comments         ANTICOAG TODAY: Anticoagulation Summary as of 01/11/2013   INR goal 2.0-3.0  Selected INR 5.10! (01/11/2013)  Next INR check 01/25/2013  Target end date Indefinite   Indications  HYPERCOAGULABLE STATE PRIMARY [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  Patient instructed to OMIT today's dose.  Patient verbalized understanding of these instructions.       FOLLOW-UP Return in 2 weeks (on 01/25/2013) for Follow up INR at 1100h.  Hulen Luster, III Pharm.D., CACP

## 2013-01-11 NOTE — Progress Notes (Signed)
Case discussed with Dr. Dorise Hiss at time of visit. We reviewed the resident's history and exam and pertinent patient test results. I agree with the assessment, diagnosis, and plan of care documented in the resident's note. Prior to IBS dx, pt would need IgA TtG to R/O Celiac Disease.

## 2013-01-11 NOTE — Telephone Encounter (Signed)
GI referral done

## 2013-01-11 NOTE — Progress Notes (Signed)
Indication:  Recurrent venous thrombosis.  Duration:  Lifelong.  INR above target.  I agree with Dr. Saralyn Pilar assessment and plan as documented.

## 2013-01-11 NOTE — Patient Instructions (Signed)
Patient instructed to take medications as defined in the Anti-coagulation Track section of this encounter.  Patient instructed to OMIT today's dose.  Patient verbalized understanding of these instructions.    

## 2013-01-25 ENCOUNTER — Ambulatory Visit (INDEPENDENT_AMBULATORY_CARE_PROVIDER_SITE_OTHER): Payer: Medicaid Other | Admitting: Pharmacist

## 2013-01-25 DIAGNOSIS — D6859 Other primary thrombophilia: Secondary | ICD-10-CM

## 2013-01-25 DIAGNOSIS — I82409 Acute embolism and thrombosis of unspecified deep veins of unspecified lower extremity: Secondary | ICD-10-CM

## 2013-01-25 DIAGNOSIS — Z7901 Long term (current) use of anticoagulants: Secondary | ICD-10-CM

## 2013-01-25 LAB — POCT INR
INR: 3.2
INR: 3.2

## 2013-01-25 NOTE — Progress Notes (Signed)
Anti-Coagulation Progress Note  Michele Gillespie is a 37 y.o. female who is currently on an anti-coagulation regimen.    RECENT RESULTS: Recent results are below, the most recent result is correlated with a dose of 87.5 mg. per week: Lab Results  Component Value Date   INR 3.2 01/25/2013   INR 3.20 01/25/2013   INR 5.10 01/11/2013    ANTI-COAG DOSE: Anticoagulation Dose Instructions as of 01/25/2013     [name] Tue Wed Thu Fri Sat   New Dose 12.5 mg 12.5 mg 10 mg 12.5 mg 12.5 mg 12.5 mg 12.5 mg       ANTICOAG SUMMARY: Anticoagulation Episode Summary   Current INR goal 2.0-3.0  Next INR check 02/15/2013  INR from last check 3.2! 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  Patient instructed to take today's dose.  Patient verbalized understanding of these instructions.       FOLLOW-UP Return in 3 weeks (on 02/15/2013) for Follow up INR at 1115h.  Hulen Luster, III Pharm.D., CACP

## 2013-01-25 NOTE — Patient Instructions (Signed)
Patient instructed to take medications as defined in the Anti-coagulation Track section of this encounter.  Patient instructed to take today's dose.  Patient verbalized understanding of these instructions.    

## 2013-01-25 NOTE — Progress Notes (Signed)
Indication: Recurrent venous thromboembolism.  Duration: Lifelong.  INR above target.  Agree with Dr. Saralyn Pilar assessment and plan as documented.

## 2013-01-27 ENCOUNTER — Other Ambulatory Visit: Payer: Self-pay | Admitting: Gastroenterology

## 2013-01-27 DIAGNOSIS — R109 Unspecified abdominal pain: Secondary | ICD-10-CM

## 2013-01-30 ENCOUNTER — Emergency Department (HOSPITAL_COMMUNITY): Payer: Medicaid Other

## 2013-01-30 ENCOUNTER — Emergency Department (HOSPITAL_COMMUNITY)
Admission: EM | Admit: 2013-01-30 | Discharge: 2013-01-30 | Disposition: A | Discharge status: Home / Self Care | Payer: Medicaid Other | Attending: Emergency Medicine | Admitting: Emergency Medicine

## 2013-01-30 ENCOUNTER — Encounter (HOSPITAL_COMMUNITY): Payer: Self-pay | Admitting: Emergency Medicine

## 2013-01-30 DIAGNOSIS — Z79899 Other long term (current) drug therapy: Secondary | ICD-10-CM | POA: Insufficient documentation

## 2013-01-30 DIAGNOSIS — Z8709 Personal history of other diseases of the respiratory system: Secondary | ICD-10-CM | POA: Insufficient documentation

## 2013-01-30 DIAGNOSIS — N889 Noninflammatory disorder of cervix uteri, unspecified: Secondary | ICD-10-CM | POA: Insufficient documentation

## 2013-01-30 DIAGNOSIS — F3289 Other specified depressive episodes: Secondary | ICD-10-CM | POA: Insufficient documentation

## 2013-01-30 DIAGNOSIS — Z7901 Long term (current) use of anticoagulants: Secondary | ICD-10-CM | POA: Insufficient documentation

## 2013-01-30 DIAGNOSIS — Z86718 Personal history of other venous thrombosis and embolism: Secondary | ICD-10-CM | POA: Insufficient documentation

## 2013-01-30 DIAGNOSIS — J45909 Unspecified asthma, uncomplicated: Secondary | ICD-10-CM | POA: Insufficient documentation

## 2013-01-30 DIAGNOSIS — R109 Unspecified abdominal pain: Secondary | ICD-10-CM

## 2013-01-30 DIAGNOSIS — F209 Schizophrenia, unspecified: Secondary | ICD-10-CM | POA: Insufficient documentation

## 2013-01-30 DIAGNOSIS — F329 Major depressive disorder, single episode, unspecified: Secondary | ICD-10-CM | POA: Insufficient documentation

## 2013-01-30 DIAGNOSIS — Z3202 Encounter for pregnancy test, result negative: Secondary | ICD-10-CM | POA: Insufficient documentation

## 2013-01-30 DIAGNOSIS — F17200 Nicotine dependence, unspecified, uncomplicated: Secondary | ICD-10-CM | POA: Insufficient documentation

## 2013-01-30 DIAGNOSIS — Z862 Personal history of diseases of the blood and blood-forming organs and certain disorders involving the immune mechanism: Secondary | ICD-10-CM | POA: Insufficient documentation

## 2013-01-30 LAB — URINALYSIS, ROUTINE W REFLEX MICROSCOPIC
Bilirubin Urine: NEGATIVE
Glucose, UA: NEGATIVE mg/dL
Ketones, ur: NEGATIVE mg/dL
Leukocytes, UA: NEGATIVE
Nitrite: NEGATIVE
Protein, ur: 30 mg/dL — AB
Specific Gravity, Urine: 1.031 — ABNORMAL HIGH (ref 1.005–1.030)
Urobilinogen, UA: 0.2 mg/dL (ref 0.0–1.0)
pH: 5.5 (ref 5.0–8.0)

## 2013-01-30 LAB — URINE MICROSCOPIC-ADD ON

## 2013-01-30 LAB — CBC WITH DIFFERENTIAL/PLATELET
Basophils Absolute: 0 10*3/uL (ref 0.0–0.1)
Basophils Relative: 1 % (ref 0–1)
Eosinophils Absolute: 0 10*3/uL (ref 0.0–0.7)
Eosinophils Relative: 1 % (ref 0–5)
HCT: 29.8 % — ABNORMAL LOW (ref 36.0–46.0)
Hemoglobin: 9.7 g/dL — ABNORMAL LOW (ref 12.0–15.0)
Lymphocytes Relative: 36 % (ref 12–46)
Lymphs Abs: 1.4 10*3/uL (ref 0.7–4.0)
MCH: 21.5 pg — ABNORMAL LOW (ref 26.0–34.0)
MCHC: 32.6 g/dL (ref 30.0–36.0)
MCV: 65.9 fL — ABNORMAL LOW (ref 78.0–100.0)
Monocytes Absolute: 0.3 10*3/uL (ref 0.1–1.0)
Monocytes Relative: 8 % (ref 3–12)
Neutro Abs: 2.1 10*3/uL (ref 1.7–7.7)
Neutrophils Relative %: 54 % (ref 43–77)
Platelets: 213 10*3/uL (ref 150–400)
RBC: 4.52 MIL/uL (ref 3.87–5.11)
RDW: 18 % — ABNORMAL HIGH (ref 11.5–15.5)
WBC: 4 10*3/uL (ref 4.0–10.5)

## 2013-01-30 LAB — COMPREHENSIVE METABOLIC PANEL
ALT: 17 U/L (ref 0–35)
AST: 18 U/L (ref 0–37)
Albumin: 3.5 g/dL (ref 3.5–5.2)
Alkaline Phosphatase: 38 U/L — ABNORMAL LOW (ref 39–117)
BUN: 9 mg/dL (ref 6–23)
CO2: 22 mEq/L (ref 19–32)
Calcium: 9 mg/dL (ref 8.4–10.5)
Chloride: 106 mEq/L (ref 96–112)
Creatinine, Ser: 0.93 mg/dL (ref 0.50–1.10)
GFR calc Af Amer: 89 mL/min — ABNORMAL LOW (ref >90)
GFR calc non Af Amer: 76 mL/min — ABNORMAL LOW (ref >90)
Glucose, Bld: 85 mg/dL (ref 70–99)
Potassium: 4 mEq/L (ref 3.5–5.1)
Sodium: 138 mEq/L (ref 135–145)
Total Bilirubin: 0.2 mg/dL — ABNORMAL LOW (ref 0.3–1.2)
Total Protein: 7.1 g/dL (ref 6.0–8.3)

## 2013-01-30 MED ORDER — HYDROCODONE-ACETAMINOPHEN 5-325 MG PO TABS: 1.0000 | ORAL_TABLET | Freq: Four times a day (QID) | ORAL | Status: DC | PRN

## 2013-01-30 MED ORDER — MORPHINE SULFATE 4 MG/ML IJ SOLN
6.0000 mg | Freq: Once | INTRAMUSCULAR | Status: AC
  Administered 2013-01-30: 6 mg via INTRAVENOUS
  Filled 2013-01-30: qty 2

## 2013-01-30 MED ORDER — SODIUM CHLORIDE 0.9 % IV BOLUS (SEPSIS)
1000.0000 mL | Freq: Once | INTRAVENOUS | Status: AC
  Administered 2013-01-30: 1000 mL via INTRAVENOUS

## 2013-01-30 MED ORDER — IOHEXOL 300 MG/ML  SOLN
50.0000 mL | Freq: Once | INTRAMUSCULAR | Status: AC | PRN
  Administered 2013-01-30: 50 mL via ORAL

## 2013-01-30 MED ORDER — IOHEXOL 300 MG/ML  SOLN
100.0000 mL | Freq: Once | INTRAMUSCULAR | Status: AC | PRN
  Administered 2013-01-30: 100 mL via INTRAVENOUS

## 2013-01-30 MED ORDER — IOHEXOL 300 MG/ML  SOLN: 100.0000 mL | Freq: Once | INTRAMUSCULAR | Status: DC | PRN

## 2013-01-30 NOTE — ED Notes (Signed)
CT notified pt finished with 1st cup of contrast.  

## 2013-01-30 NOTE — ED Notes (Signed)
Pt ambulated to restroom, no complaint.

## 2013-01-30 NOTE — ED Provider Notes (Signed)
History     CSN: [id number]  Arrival date & time 01/30/13  0703   First MD Initiated Contact with Patient 01/30/13 [phone]      Chief Complaint  Patient presents with  . Abdominal Pain    (Consider location/radiation/quality/duration/timing/severity/associated sxs/prior treatment) HPI Patient is a 37 y.o. AAF with history of PE, DVT, asthma, depression, schizophrenia, who presents to emergency department complaining of abdominal pain and diarrhea for two months.  Patient has had worsening, intermittent, sharp and sometimes cramping, generalized abdominal pain for two months.  Associated symptoms include episodes of loose stools 3-5 times/day and nausea with dry heaving (one episode of non-bloody, non-bilious emesis 3 weeks ago).  She reports a small amount of bright red blood in her stool 3 hours prior to arriving at the ED this morning.  She has never had blood in her stool before.  She denies history of hemorrhoids.  Occasionally bowel movements relieve her abdominal pain for a short period of time.  She has taken tylenol for pain without much relief.  She denies fever, constipation, vaginal discharge, vaginal bleeding, dysuria, and shortness of breath.  She was seen for this problem one month ago and referred to gastroenterology.  Past Medical History  Diagnosis Date  . Pulmonary embolism  September 07, 2005     her CT angiogram - positive for pulmonary emboli to several branches of the right lower lobe- relatively small clot burden, clear lung; patient started on Coumadin; CT angiogram on January 10, 2006 showed resolution of previously seen pulmonary emboli with minimal basilar atelectasis  . Lung nodule  June 10, 2008     stable tiny noduke noted along the minor fissure of the right lung on CT angio September 11, 09 -  stable for 2 years and consistent with benign disease  . Arm DVT (deep venous thromboembolism), acute  September 23, 2009, January 05, 2010     Doppler study significant with  indeterminant age DVT involving the left upper extremity, Doppler performed January 05, 2010 consistent with acute DVT involving the left upper extremity  . Asthma   . Anemia 2007     microcytic anemia, baseline hemoglobin 10-11, MCV at baseline 72-77, secondary to iron deficiency  . Schizophrenia   . Depression   . Leukopenia 2008     unclear etiology baseline WBC  2.8-3.7    Past Surgical History  Procedure Laterality Date  . Cesarean section       History of 5 C-section  . Tubal ligation    . Exploratory laparotomy with abdominal mass excision  02/2005    Family History  Problem Relation Age of Onset  . Hypertension Mother   . Heart disease Mother   . Diabetes Father   . Birth defects Maternal Aunt   . Birth defects Maternal Uncle   . Diabetes Paternal Grandmother     History  Substance Use Topics  . Smoking status: Current Every Day Smoker -- 1.00 packs/day    Types: Cigarettes  . Smokeless tobacco: Not on file  . Alcohol Use: Yes     Comment: occ beer    OB History   Grav Para Term Preterm Abortions TAB SAB Ect Mult Living   6 5        5       Review of Systems Review of systems as above, otherwise negative. Allergies  Review of patient's allergies indicates no known allergies.  Home Medications   Current Outpatient Rx  Name  Route  Sig  Dispense  Refill  . albuterol (PROVENTIL HFA;VENTOLIN HFA) 108 (90 BASE) MCG/ACT inhaler   Inhalation   Inhale 2 puffs into the lungs every 6 (six) hours as needed for wheezing. For shortness of breath   1 Inhaler   0   . ARIPiprazole (ABILIFY) 10 MG tablet   Oral   Take 10 mg by mouth at bedtime.          Marland Kitchen HYDROcodone-acetaminophen (NORCO/VICODIN) 5-325 MG per tablet   Oral   Take 2 tablets by mouth every 4 (four) hours as needed for pain.   10 tablet   0   . Olopatadine HCl (PATADAY) 0.2 % SOLN   Ophthalmic   Apply 1 drop to eye daily.          Marland Kitchen warfarin (COUMADIN) 5 MG tablet      Take three tablets  on Sunday, Tuesday, Thursday, and Saturday. Take two and a half tablets on Monday, Wednesday, Friday.   80 tablet   2     BP 126/84  Pulse 73  Temp(Src) 98.5 F (36.9 C) (Oral)  Resp 12  SpO2 100%  LMP 01/24/2013  Physical Exam  Nursing note and vitals reviewed. Constitutional: She is oriented to person, place, and time. She appears well-developed and well-nourished.  Obese.  HENT:  Head: Normocephalic and atraumatic.  Mouth/Throat: Oropharynx is clear and moist.  Eyes: Conjunctivae are normal. Pupils are equal, round, and reactive to light.  Neck: Normal range of motion. Neck supple.  Cardiovascular: Normal rate, regular rhythm, normal heart sounds and intact distal pulses.  Exam reveals no friction rub.   No murmur heard. Pulmonary/Chest: Effort normal and breath sounds normal. No respiratory distress. She has no wheezes. She has no rales. She exhibits no tenderness.  Abdominal: Soft. Normal appearance and bowel sounds are normal. She exhibits no distension. There is generalized tenderness. There is guarding. There is no rebound, no tenderness at McBurney's point and negative Murphy's sign.  Genitourinary:  Patient had recent pelvic exam that was concerning for this possible cervical issue.  Lymphadenopathy:    She has no cervical adenopathy.  Neurological: She is alert and oriented to person, place, and time.  Skin: Skin is warm and dry. No rash noted.    ED Course  Procedures (including critical care time)  Labs Reviewed  CBC WITH DIFFERENTIAL - Abnormal; Notable for the following:    Hemoglobin 9.7 (*)    HCT 29.8 (*)    MCV 65.9 (*)    MCH 21.5 (*)    RDW 18.0 (*)    All other components within normal limits  COMPREHENSIVE METABOLIC PANEL  URINALYSIS, ROUTINE W REFLEX MICROSCOPIC  The patient is advised of her test results and findings. The patient states that she was referred because of a concerning cervical issue but no further testing was done by the GYN. The  patient is advised that cancer can't be excluded. The patient will need further GI follow as well for her abdominal pain.   MDM  MDM Reviewed: nursing note, vitals and previous chart Reviewed previous: labs Interpretation: labs and CT scan            Carlyle Dolly, PA-C 01/30/13 1023

## 2013-01-30 NOTE — ED Notes (Signed)
Pt to ED with c/o abdominal pain onset x2 months that worsen this morning at 0500. Pt was discharged from ED on 12/26/2012.

## 2013-01-30 NOTE — ED Provider Notes (Signed)
Medical screening examination/treatment/procedure(s) were performed by non-physician practitioner and as supervising physician I was immediately available for consultation/collaboration.   Phillippa Straub, MD 01/30/13 1302 

## 2013-02-05 ENCOUNTER — Ambulatory Visit
Admission: RE | Admit: 2013-02-05 | Discharge: 2013-02-05 | Disposition: A | Discharge status: Home / Self Care | Payer: Medicaid Other | Source: Ambulatory Visit | Attending: Gastroenterology | Admitting: Gastroenterology

## 2013-02-05 ENCOUNTER — Telehealth: Payer: Self-pay | Admitting: *Deleted

## 2013-02-05 DIAGNOSIS — R109 Unspecified abdominal pain: Secondary | ICD-10-CM

## 2013-02-05 NOTE — Telephone Encounter (Signed)
Pt calls and states she was told she had a possible tumor on her cervix and needs to be seen in [facility] and referred to gyn

## 2013-02-08 ENCOUNTER — Ambulatory Visit (INDEPENDENT_AMBULATORY_CARE_PROVIDER_SITE_OTHER): Payer: Medicaid Other | Admitting: Internal Medicine

## 2013-02-08 ENCOUNTER — Encounter: Payer: Self-pay | Admitting: Internal Medicine

## 2013-02-08 VITALS — BP 121/88 | HR 84 | Temp 97.5°F | Ht 64.0 in | Wt 217.3 lb

## 2013-02-08 DIAGNOSIS — J45909 Unspecified asthma, uncomplicated: Secondary | ICD-10-CM

## 2013-02-08 DIAGNOSIS — N888 Other specified noninflammatory disorders of cervix uteri: Secondary | ICD-10-CM | POA: Insufficient documentation

## 2013-02-08 MED ORDER — OLOPATADINE HCL 0.2 % OP SOLN: 1.0000 [drp] | Freq: Every day | OPHTHALMIC | Status: DC

## 2013-02-08 MED ORDER — ALBUTEROL SULFATE HFA 108 (90 BASE) MCG/ACT IN AERS: 2.0000 | INHALATION_SPRAY | Freq: Four times a day (QID) | RESPIRATORY_TRACT | Status: DC | PRN

## 2013-02-08 NOTE — Progress Notes (Signed)
  Subjective:    Patient ID: Michele Gillespie, female    DOB: 01/26/1976, 37 y.o.   MRN: [id number]  HPI patient is a pleasant 37 year old woman with asthma, history of DVT, chronic diarrhea and abdominal pain and other problems as per problem list who comes the clinic for GYN referral for newly found cervical mass.  Patient was evaluated in the ED on 01/30/2013 when CT abdomen pelvis showed moderate endometrial fluid collection with 5 x 5 cm cervical nodular density- possibly submucosal fibroid/complicated nabothian cyst/malignancy. Last GYN office visit on 12/21/2012 with normal exam. No history of cervical cancer.  Patient does report loss of weight- but attributes that to her chronic diarrhea and abdominal pain. She has been followed with Dr. Evette Cristal for this and has an appointment coming up with upper GI series.  She denies any fever, chills, headache, palpitations, chest pain, short of breath.   Review of Systems    as per history of present illness. Objective:   Physical Exam  General: NAD HEENT: PERRL, EOMI, no scleral icterus Cardiac: S1, S2, RRR, no rubs, murmurs or gallops Pulm: clear to auscultation bilaterally, moving normal volumes of air Abd: soft, nontender, nondistended, BS present Ext: warm and well perfused, no pedal edema Neuro: alert and oriented X3, cranial nerves II-XII grossly intact       Assessment & Plan:

## 2013-02-08 NOTE — Patient Instructions (Signed)
Please make a follow appointment as needed.  Followup with GYN for cervical/uterine issue.  Followup with Dr. Evette Cristal for the diarrhea and pain.  Call the clinic if needed earlier.

## 2013-02-08 NOTE — Assessment & Plan Note (Signed)
Stable.  Albuterol refilled. ?

## 2013-02-08 NOTE — Assessment & Plan Note (Signed)
As per CT scan on 01/30/2013- patient has moderate endometrial fluid collection with 5 x 5 cm nodular density consistent with submucosal fibroid/completed nabothian cyst/malignancy. - Needs direct visualization with exam and possible ultrasound. - GYN referral made.

## 2013-02-10 ENCOUNTER — Ambulatory Visit
Admission: RE | Admit: 2013-02-10 | Discharge: 2013-02-10 | Disposition: A | Discharge status: Home / Self Care | Payer: Medicaid Other | Source: Ambulatory Visit | Attending: Gastroenterology | Admitting: Gastroenterology

## 2013-02-10 DIAGNOSIS — R109 Unspecified abdominal pain: Secondary | ICD-10-CM

## 2013-02-10 NOTE — Progress Notes (Signed)
Case discussed with Dr. Patel immediately after the resident saw the patient.  We reviewed the resident's history and exam and pertinent patient test results.  I agree with the assessment, diagnosis and plan of care documented in the resident's note. 

## 2013-02-15 ENCOUNTER — Ambulatory Visit (INDEPENDENT_AMBULATORY_CARE_PROVIDER_SITE_OTHER): Payer: Medicaid Other | Admitting: Pharmacist

## 2013-02-15 DIAGNOSIS — D6859 Other primary thrombophilia: Secondary | ICD-10-CM

## 2013-02-15 DIAGNOSIS — Z7901 Long term (current) use of anticoagulants: Secondary | ICD-10-CM

## 2013-02-15 DIAGNOSIS — I82409 Acute embolism and thrombosis of unspecified deep veins of unspecified lower extremity: Secondary | ICD-10-CM

## 2013-02-15 MED ORDER — WARFARIN SODIUM 5 MG PO TABS: ORAL_TABLET | ORAL | Status: DC

## 2013-02-15 NOTE — Patient Instructions (Signed)
Patient instructed to take medications as defined in the Anti-coagulation Track section of this encounter.  Patient instructed to take today's dose.  Patient verbalized understanding of these instructions.    

## 2013-02-15 NOTE — Progress Notes (Signed)
Anti-Coagulation Progress Note  Michele Gillespie is a 37 y.o. female who is currently on an anti-coagulation regimen.    RECENT RESULTS: Recent results are below, the most recent result is correlated with a dose of 85 mg. per week: Lab Results  Component Value Date   INR 1.60 02/15/2013   INR 3.2 01/25/2013   INR 3.20 01/25/2013    ANTI-COAG DOSE: Anticoagulation Dose Instructions as of 02/15/2013     [name] Tue Wed Thu Fri Sat   New Dose 12.5 mg 15 mg 12.5 mg 12.5 mg 15 mg 12.5 mg 12.5 mg       ANTICOAG SUMMARY: Anticoagulation Episode Summary   Current INR goal 2.0-3.0  Next INR check 03/01/2013  INR from last check 1.60! (02/15/2013)  Weekly max dose   Target end date Indefinite  [facility] check [facility]  Preferred lab   Send INR reminders to    Indications  HYPERCOAGULABLE STATE PRIMARY [289.81] Long term current use of anticoagulant [V58.61]        Comments         ANTICOAG TODAY: Anticoagulation Summary as of 02/15/2013   INR goal 2.0-3.0  Selected INR 1.60! (02/15/2013)  Next INR check 03/01/2013  Target end date Indefinite   Indications  HYPERCOAGULABLE STATE PRIMARY [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  Patient instructed to take today's dose.  Patient verbalized understanding of these instructions.       FOLLOW-UP Return in 2 weeks (on 03/01/2013) for Follow up INR at 1130h.  Hulen Luster, III Pharm.D., CACP

## 2013-02-24 ENCOUNTER — Ambulatory Visit
Admission: RE | Admit: 2013-02-24 | Discharge: 2013-02-24 | Disposition: A | Discharge status: Home / Self Care | Payer: Medicaid Other | Source: Ambulatory Visit | Attending: Gastroenterology | Admitting: Gastroenterology

## 2013-02-24 DIAGNOSIS — R109 Unspecified abdominal pain: Secondary | ICD-10-CM

## 2013-03-01 ENCOUNTER — Ambulatory Visit (INDEPENDENT_AMBULATORY_CARE_PROVIDER_SITE_OTHER): Payer: Medicaid Other | Admitting: Pharmacist

## 2013-03-01 DIAGNOSIS — D6859 Other primary thrombophilia: Secondary | ICD-10-CM

## 2013-03-01 DIAGNOSIS — I82409 Acute embolism and thrombosis of unspecified deep veins of unspecified lower extremity: Secondary | ICD-10-CM

## 2013-03-01 DIAGNOSIS — Z7901 Long term (current) use of anticoagulants: Secondary | ICD-10-CM

## 2013-03-01 NOTE — Patient Instructions (Signed)
Patient instructed to take medications as defined in the Anti-coagulation Track section of this encounter.  Patient instructed to take today's dose.  Patient verbalized understanding of these instructions.    

## 2013-03-01 NOTE — Progress Notes (Signed)
Anti-Coagulation Progress Note  Michele Gillespie is a 37 y.o. female who is currently on an anti-coagulation regimen.    RECENT RESULTS: Recent results are below, the most recent result is correlated with a dose of 95 mg. per week: Lab Results  Component Value Date   INR 2.40 03/01/2013   INR 1.60 02/15/2013   INR 3.2 01/25/2013    ANTI-COAG DOSE: Anticoagulation Dose Instructions as of 03/01/2013     [name] Tue Wed Thu Fri Sat   New Dose 12.5 mg 15 mg 12.5 mg 12.5 mg 15 mg 12.5 mg 12.5 mg       ANTICOAG SUMMARY: Anticoagulation Episode Summary   Current INR goal 2.0-3.0  Next INR check 03/22/2013  INR from last check 2.40 (03/01/2013)  Weekly max dose   Target end date Indefinite  [facility]  Preferred lab   Send INR reminders to    Indications  HYPERCOAGULABLE STATE PRIMARY [289.81] Long term current use of anticoagulant [V58.61]        Comments         ANTICOAG TODAY: Anticoagulation Summary as of 03/01/2013   INR goal 2.0-3.0  Selected INR 2.40 (03/01/2013)  Next INR check 03/22/2013  Target end date Indefinite   Indications  HYPERCOAGULABLE STATE PRIMARY [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  Patient instructed to take today's dose.  Patient verbalized understanding of these instructions.       FOLLOW-UP Return in 3 weeks (on 03/22/2013) for Follow up INR at 1130h.  Hulen Luster, III Pharm.D., CACP

## 2013-03-06 ENCOUNTER — Encounter: Payer: Self-pay | Admitting: Internal Medicine

## 2013-03-06 DIAGNOSIS — N898 Other specified noninflammatory disorders of vagina: Secondary | ICD-10-CM | POA: Insufficient documentation

## 2013-03-06 NOTE — Assessment & Plan Note (Signed)
We discussed hygiene issues to try to limit reinfection including waiting until both her and her partner finish their treatment for the Trichomonas.  We also will try white cotton panties, no baths, and no douching to see if that helps.  We discussed treatment with acidophilis vs yogurt with active cultures and that there is no evidence that they help but there is anecdotal evidence that they may help in recurrent infections.

## 2013-03-06 NOTE — Assessment & Plan Note (Signed)
We discussed her smoking and she states that she is still smoking around 1/2 ppd.  We discussed several strategies to help her cut down and quit smoking including setting a place to smoke and working to delay the first morning cigarette at least 30-60 minutes.  We also gave her a prescription for nicotine patches to help her cravings.

## 2013-03-14 ENCOUNTER — Emergency Department (HOSPITAL_COMMUNITY): Payer: Medicaid Other

## 2013-03-14 ENCOUNTER — Emergency Department (HOSPITAL_COMMUNITY)
Admission: EM | Admit: 2013-03-14 | Discharge: 2013-03-14 | Disposition: A | Discharge status: Home / Self Care | Payer: Medicaid Other | Attending: Emergency Medicine | Admitting: Emergency Medicine

## 2013-03-14 ENCOUNTER — Encounter (HOSPITAL_COMMUNITY): Payer: Self-pay | Admitting: *Deleted

## 2013-03-14 DIAGNOSIS — J45909 Unspecified asthma, uncomplicated: Secondary | ICD-10-CM | POA: Insufficient documentation

## 2013-03-14 DIAGNOSIS — Z7901 Long term (current) use of anticoagulants: Secondary | ICD-10-CM | POA: Insufficient documentation

## 2013-03-14 DIAGNOSIS — F3289 Other specified depressive episodes: Secondary | ICD-10-CM | POA: Insufficient documentation

## 2013-03-14 DIAGNOSIS — D649 Anemia, unspecified: Secondary | ICD-10-CM | POA: Insufficient documentation

## 2013-03-14 DIAGNOSIS — S20219A Contusion of unspecified front wall of thorax, initial encounter: Secondary | ICD-10-CM | POA: Insufficient documentation

## 2013-03-14 DIAGNOSIS — F17200 Nicotine dependence, unspecified, uncomplicated: Secondary | ICD-10-CM | POA: Insufficient documentation

## 2013-03-14 DIAGNOSIS — Z79899 Other long term (current) drug therapy: Secondary | ICD-10-CM | POA: Insufficient documentation

## 2013-03-14 DIAGNOSIS — Y92410 Unspecified street and highway as the place of occurrence of the external cause: Secondary | ICD-10-CM | POA: Insufficient documentation

## 2013-03-14 DIAGNOSIS — F329 Major depressive disorder, single episode, unspecified: Secondary | ICD-10-CM | POA: Insufficient documentation

## 2013-03-14 DIAGNOSIS — J984 Other disorders of lung: Secondary | ICD-10-CM | POA: Insufficient documentation

## 2013-03-14 DIAGNOSIS — Y998 Other external cause status: Secondary | ICD-10-CM | POA: Insufficient documentation

## 2013-03-14 DIAGNOSIS — Z86711 Personal history of pulmonary embolism: Secondary | ICD-10-CM | POA: Insufficient documentation

## 2013-03-14 DIAGNOSIS — IMO0002 Reserved for concepts with insufficient information to code with codable children: Secondary | ICD-10-CM | POA: Insufficient documentation

## 2013-03-14 DIAGNOSIS — F209 Schizophrenia, unspecified: Secondary | ICD-10-CM | POA: Insufficient documentation

## 2013-03-14 DIAGNOSIS — Z86718 Personal history of other venous thrombosis and embolism: Secondary | ICD-10-CM | POA: Insufficient documentation

## 2013-03-14 DIAGNOSIS — Z3202 Encounter for pregnancy test, result negative: Secondary | ICD-10-CM | POA: Insufficient documentation

## 2013-03-14 DIAGNOSIS — S301XXA Contusion of abdominal wall, initial encounter: Secondary | ICD-10-CM | POA: Insufficient documentation

## 2013-03-14 LAB — POCT PREGNANCY, URINE: Preg Test, Ur: NEGATIVE

## 2013-03-14 LAB — PROTIME-INR: Prothrombin Time: 19.7 seconds — ABNORMAL HIGH (ref 11.6–15.2)

## 2013-03-14 LAB — POCT I-STAT, CHEM 8
Chloride: 108 mEq/L (ref 96–112)
Glucose, Bld: 84 mg/dL (ref 70–99)
HCT: 34 % — ABNORMAL LOW (ref 36.0–46.0)
Potassium: 3.6 mEq/L (ref 3.5–5.1)

## 2013-03-14 MED ORDER — IOHEXOL 300 MG/ML  SOLN
100.0000 mL | Freq: Once | INTRAMUSCULAR | Status: AC | PRN
  Administered 2013-03-14: 100 mL via INTRAVENOUS

## 2013-03-14 MED ORDER — METHOCARBAMOL 500 MG PO TABS: 500.0000 mg | ORAL_TABLET | Freq: Two times a day (BID) | ORAL | Status: DC

## 2013-03-14 MED ORDER — HYDROCODONE-ACETAMINOPHEN 5-325 MG PO TABS: 1.0000 | ORAL_TABLET | Freq: Four times a day (QID) | ORAL | Status: DC | PRN

## 2013-03-14 MED ORDER — NAPROXEN 375 MG PO TABS: 375.0000 mg | ORAL_TABLET | Freq: Two times a day (BID) | ORAL | Status: DC
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
## 2013-03-17 ENCOUNTER — Encounter: Payer: Medicaid Other | Admitting: Obstetrics & Gynecology

## 2013-03-19 NOTE — ED Provider Notes (Signed)
Medical screening examination/treatment/procedure(s) were performed by non-physician practitioner and as supervising physician I was immediately available for consultation/collaboration.   Cherice Glennie H Ceirra Belli, MD 03/19/13 0924 

## 2013-03-22 ENCOUNTER — Ambulatory Visit (INDEPENDENT_AMBULATORY_CARE_PROVIDER_SITE_OTHER): Payer: Medicaid Other | Admitting: Pharmacist

## 2013-03-22 DIAGNOSIS — D6859 Other primary thrombophilia: Secondary | ICD-10-CM

## 2013-03-22 DIAGNOSIS — Z7901 Long term (current) use of anticoagulants: Secondary | ICD-10-CM

## 2013-03-22 LAB — POCT INR: INR: 3

## 2013-03-22 NOTE — Progress Notes (Signed)
Anti-Coagulation Progress Note  Michele Gillespie is a 37 y.o. female who is currently on an anti-coagulation regimen.    RECENT RESULTS: Recent results are below, the most recent result is correlated with a dose of 92.5 mg. per week: Lab Results  Component Value Date   INR 3.0 03/22/2013   INR 1.73* 03/14/2013   INR 2.40 03/01/2013    ANTI-COAG DOSE: Anticoagulation Dose Instructions as of 03/22/2013     [name] Tue Wed Thu Fri Sat   New Dose 12.5 mg 12.5 mg 12.5 mg 15 mg 12.5 mg 12.5 mg 12.5 mg       ANTICOAG SUMMARY: Anticoagulation Episode Summary   Current INR goal 2.0-3.0  Next INR check 04/12/2013  INR from last check 3.0 (03/22/2013)  Weekly max dose   Target end date Indefinite  [facility]  Preferred lab   Send INR reminders to    Indications  HYPERCOAGULABLE STATE PRIMARY [289.81] Long term current use of anticoagulant [V58.61]        Comments         ANTICOAG TODAY: Anticoagulation Summary as of 03/22/2013   INR goal 2.0-3.0  Selected INR 3.0 (03/22/2013)  Next INR check 04/12/2013  Target end date Indefinite   Indications  HYPERCOAGULABLE STATE PRIMARY [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  Patient instructed to OMIT today's dose (already taken---and recommence TOMORROW) on NEW REGIMEN provided [ 2 and 1/2 tablets of 5mg  912.5mg ) on ALL days of week EXCEPT on [facility] takes 3x5mg  (15mg ) warfarin. INR will need to be checked in 3 weeks to determine if dose adjustment is necessary.  Patient verbalized understanding of these instructions.       FOLLOW-UP Return in 3 weeks (on 04/12/2013) for Follow up INR at 1110h.  Hulen Luster, III Pharm.D., CACP

## 2013-03-22 NOTE — Patient Instructions (Signed)
Patient instructed to take medications as defined in the Anti-coagulation Track section of this encounter.  Patient instructed to OMIT today's dose (already taken---and recommence TOMORROW) on NEW REGIMEN provided [ 2 and 1/2 tablets of 5mg  912.5mg ) on ALL days of week EXCEPT on [facility] takes 3x5mg  (15mg ) warfarin. INR will need to be checked in 3 weeks to determine if dose adjustment is necessary.  Patient verbalized understanding of these instructions.

## 2013-04-12 ENCOUNTER — Encounter: Payer: Self-pay | Admitting: Internal Medicine

## 2013-04-12 ENCOUNTER — Ambulatory Visit: Payer: Medicaid Other

## 2013-04-19 ENCOUNTER — Ambulatory Visit (INDEPENDENT_AMBULATORY_CARE_PROVIDER_SITE_OTHER): Payer: Medicaid Other | Admitting: Obstetrics and Gynecology

## 2013-04-19 ENCOUNTER — Encounter: Payer: Self-pay | Admitting: Obstetrics and Gynecology

## 2013-04-19 ENCOUNTER — Other Ambulatory Visit (HOSPITAL_COMMUNITY)
Admission: RE | Admit: 2013-04-19 | Discharge: 2013-04-19 | Disposition: A | Discharge status: Home / Self Care | Payer: Medicaid Other | Source: Ambulatory Visit | Attending: Obstetrics and Gynecology | Admitting: Obstetrics and Gynecology

## 2013-04-19 VITALS — BP 129/92 | HR 66 | Temp 98.8°F | Ht 64.0 in | Wt 206.0 lb

## 2013-04-19 DIAGNOSIS — Z1151 Encounter for screening for human papillomavirus (HPV): Secondary | ICD-10-CM | POA: Insufficient documentation

## 2013-04-19 DIAGNOSIS — Z01419 Encounter for gynecological examination (general) (routine) without abnormal findings: Secondary | ICD-10-CM | POA: Insufficient documentation

## 2013-04-19 DIAGNOSIS — D259 Leiomyoma of uterus, unspecified: Secondary | ICD-10-CM

## 2013-04-19 NOTE — Progress Notes (Signed)
  Subjective:    Patient ID: Michele Gillespie, female    DOB: [birth year], 37 y.o.   MRN: [id number]  HPI  38 yo G5P5 with LMP 04/18/2013 presenting today for evaluation of ? Fibroid uterus. Patient was referred to our office following an ED visit with CT scan which demonstrated a ? Cervical mass which could represent a submucosal fibroid or nabothian cyst. Patient is currently without any complaints. She reports monthly cycles q 28 days, lasting 5-7 days. Her heaviest days are the day 2 and 3 with occasional passage of clots. Patient has not had a pap smear in a few years. Despite the use of coumadin, patient states that her cycles have not changed much since onset of menarche. She is sexually active using BTL for contraception  Past Medical History  Diagnosis Date  . Pulmonary embolism  September 07, 2005     her CT angiogram - positive for pulmonary emboli to several branches of the right lower lobe- relatively small clot burden, clear lung; patient started on Coumadin; CT angiogram on January 10, 2006 showed resolution of previously seen pulmonary emboli with minimal basilar atelectasis  . Lung nodule  June 10, 2008     stable tiny noduke noted along the minor fissure of the right lung on CT angio September 11, 09 -  stable for 2 years and consistent with benign disease  . Arm DVT (deep venous thromboembolism), acute  September 23, 2009, January 05, 2010     Doppler study significant with indeterminant age DVT involving the left upper extremity, Doppler performed January 05, 2010 consistent with acute DVT involving the left upper extremity  . Asthma   . Anemia 2007     microcytic anemia, baseline hemoglobin 10-11, MCV at baseline 72-77, secondary to iron deficiency  . Schizophrenia   . Depression   . Leukopenia 2008     unclear etiology baseline WBC  2.8-3.7   Past Surgical History  Procedure Laterality Date  . Cesarean section       History of 5 C-section  . Tubal ligation    . Exploratory  laparotomy with abdominal mass excision  02/2005   History  Substance Use Topics  . Smoking status: Current Every Day Smoker -- 1.00 packs/day    Types: Cigarettes  . Smokeless tobacco: Not on file  . Alcohol Use: Yes     Comment: occ beer   Family History  Problem Relation Age of Onset  . Hypertension Mother   . Heart disease Mother   . Diabetes Father   . Birth defects Maternal Aunt   . Birth defects Maternal Uncle   . Diabetes Paternal Grandmother      Review of Systems  All other systems reviewed and are negative.       Objective:   Physical Exam  GENERAL: Well-developed, well-nourished female in no acute distress.  ABDOMEN: Soft, nontender, nondistended. Obese PELVIC: Normal external female genitalia. Vagina is pink and rugated.  Normal discharge. Normal appearing cervix. Bimanual exam limited secondary to body habitus. No adnexal mass or tenderness. EXTREMITIES: No cyanosis, clubbing, or edema, 2+ distal pulses.     Assessment & Plan:  37 yo G5P5 with ? Submucosal fibroid on CT scan. - Pelvic ultrasound ordered - RTC in 2 weeks to discuss results and further management if indicated - pap smear collected today

## 2013-04-21 ENCOUNTER — Telehealth: Payer: Self-pay | Admitting: *Deleted

## 2013-04-21 NOTE — Telephone Encounter (Signed)
Person calls who states he is pt's sig other and states pt is incarcerated at [facility] and is not being given her coumadin as ordered, wants to know if dr groce can do something, he says he is afraid she will die, please advise

## 2013-04-21 NOTE — Telephone Encounter (Signed)
I spoke with Dr. Alexandria Lodge by phone; he will try to contact the physician responsible for patient's care to coordinate her anticoagulation management.  I will forward this note to him.

## 2013-04-22 ENCOUNTER — Telehealth: Payer: Self-pay | Admitting: Pharmacist

## 2013-04-22 NOTE — Telephone Encounter (Signed)
Patient last seen by me in 23-JUN-14 at which time she revealed to me that she was probably going to be incarcerated the next day during a court proceeding. I gave her 3 copies of printed instructions including her current medication list to provide to any care giver during her incarceration. Yesterday, our Triage Nurse was contacted by a friend of the patient expressing concern that her warfarin was not being administered--or not being administered appropriately relative to last provided after visit summary from 23-JUN-14. Today, with assistance of the Triage Nurse we were able to determine that the patient was not showing up in the local [facility] for incarceration. Similarly she was not listed in the [place] [facility] database. Subsequently, patient's family contact listed in EHR was contacted (Aunt---Sylvia) who indicated that the patient was released from her incarceration yesterday. The Aunt was asked to convey to the patient that the patient should come to the anticoagulation clinic on Monday 28-JUL-14 for a prothrombin time/INR test.

## 2013-04-26 ENCOUNTER — Ambulatory Visit (INDEPENDENT_AMBULATORY_CARE_PROVIDER_SITE_OTHER): Payer: Medicaid Other | Admitting: Pharmacist

## 2013-04-26 DIAGNOSIS — D6859 Other primary thrombophilia: Secondary | ICD-10-CM

## 2013-04-26 DIAGNOSIS — Z7901 Long term (current) use of anticoagulants: Secondary | ICD-10-CM

## 2013-04-26 LAB — POCT INR: INR: 1.9

## 2013-04-26 MED ORDER — WARFARIN SODIUM 5 MG PO TABS: ORAL_TABLET | ORAL | Status: DC

## 2013-04-26 NOTE — Progress Notes (Signed)
Anti-Coagulation Progress Note  Michele Gillespie is a 37 y.o. female who is currently on an anti-coagulation regimen.    RECENT RESULTS: Recent results are below, the most recent result is correlated with a dose of 90 mg. per week: Lab Results  Component Value Date   INR 1.90 04/26/2013   INR 3.0 03/22/2013   INR 1.73* 03/14/2013    ANTI-COAG DOSE: Anticoagulation Dose Instructions as of 04/26/2013     [name] Tue Wed Thu Fri Sat   New Dose 12.5 mg 12.5 mg 15 mg 12.5 mg 12.5 mg 15 mg 12.5 mg       ANTICOAG SUMMARY: Anticoagulation Episode Summary   Current INR goal 2.0-3.0  Next INR check 05/17/2013  INR from last check 1.90! (04/26/2013)  Weekly max dose   Target end date Indefinite  [facility]  Preferred lab   Send INR reminders to    Indications  HYPERCOAGULABLE STATE PRIMARY [289.81] Long term current use of anticoagulant [V58.61]        Comments         ANTICOAG TODAY: Anticoagulation Summary as of 04/26/2013   INR goal 2.0-3.0  Selected INR 1.90! 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  Patient instructed to take today's dose.  Patient verbalized understanding of these instructions.       FOLLOW-UP Return in 3 weeks (on 05/17/2013) for Follow up INR at 1000h.  Hulen Luster, III Pharm.D., CACP

## 2013-04-26 NOTE — Patient Instructions (Signed)
Patient instructed to take medications as defined in the Anti-coagulation Track section of this encounter.  Patient instructed to take today's dose.  Patient verbalized understanding of these instructions.    

## 2013-04-27 ENCOUNTER — Ambulatory Visit (HOSPITAL_COMMUNITY)
Admission: RE | Admit: 2013-04-27 | Discharge: 2013-04-27 | Disposition: A | Discharge status: Home / Self Care | Payer: Medicaid Other | Source: Ambulatory Visit | Attending: Obstetrics and Gynecology | Admitting: Obstetrics and Gynecology

## 2013-04-27 ENCOUNTER — Ambulatory Visit (INDEPENDENT_AMBULATORY_CARE_PROVIDER_SITE_OTHER): Payer: Medicaid Other | Admitting: Internal Medicine

## 2013-04-27 ENCOUNTER — Encounter: Payer: Self-pay | Admitting: Internal Medicine

## 2013-04-27 VITALS — BP 117/82 | HR 70 | Temp 97.2°F | Ht 64.0 in | Wt 206.4 lb

## 2013-04-27 DIAGNOSIS — N898 Other specified noninflammatory disorders of vagina: Secondary | ICD-10-CM

## 2013-04-27 DIAGNOSIS — J45909 Unspecified asthma, uncomplicated: Secondary | ICD-10-CM

## 2013-04-27 DIAGNOSIS — D259 Leiomyoma of uterus, unspecified: Secondary | ICD-10-CM | POA: Insufficient documentation

## 2013-04-27 DIAGNOSIS — R809 Proteinuria, unspecified: Secondary | ICD-10-CM

## 2013-04-27 DIAGNOSIS — L293 Anogenital pruritus, unspecified: Secondary | ICD-10-CM

## 2013-04-27 LAB — URINALYSIS, ROUTINE W REFLEX MICROSCOPIC
Hgb urine dipstick: NEGATIVE
Leukocytes, UA: NEGATIVE
Nitrite: NEGATIVE
Protein, ur: NEGATIVE mg/dL
Urobilinogen, UA: 1 mg/dL (ref 0.0–1.0)

## 2013-04-27 MED ORDER — TERCONAZOLE 0.4 % VA CREA: 1.0000 | TOPICAL_CREAM | Freq: Every day | VAGINAL | Status: DC

## 2013-04-27 MED ORDER — OLOPATADINE HCL 0.2 % OP SOLN: 1.0000 [drp] | Freq: Every day | OPHTHALMIC | Status: DC

## 2013-04-27 NOTE — Assessment & Plan Note (Addendum)
Incidentally, urine dipstick is positive for protein 30 mg/dL. Prior urine studies have not shown proteinuria. - Ordered a full urinalysis with sediment examination and urine microalbumin/creatinine ratio - If positive, will repeat qualitative proteinuria test to r/o transient proteinuria  ADDENDUM: Urinalysis is negative for protein. Other urine studies show 1.16 mg/dL micro albumin, which is in the normal range.

## 2013-04-27 NOTE — Patient Instructions (Addendum)
Thank you for your visit - I will call if the results of your urinalysis are abnormal - Please use your terconazole cream for vaginitis for 7 days - If you continue to have itching or develop vaginal discharge, please call the clinic

## 2013-04-27 NOTE — Progress Notes (Signed)
I saw and evaluated the patient.  I personally confirmed the key portions of the history and exam documented by Dr. Cater and I reviewed pertinent patient test results.  The assessment, diagnosis, and plan were formulated together and I agree with the documentation in the resident's note. 

## 2013-04-27 NOTE — Assessment & Plan Note (Addendum)
Urine dipstick is negative for nitrites or leuk esterase, making urinary tract infection unlikely. She says that terconazole cream has helped with prior episodes of vaginal itching just like the one she has today. Declines pelvic exam. - Ordered full urinalysis - Terconazole 0.4% vaginal cream nightly for one week - Reinforced that she should stop using the inciting agent (scented soaps) if it is consistently causing her vaginitis as she reports. - Patient will call the clinic if she develops abnormal vaginal discharge, fevers, abdominal pain, dysuria

## 2013-04-27 NOTE — Progress Notes (Signed)
  Subjective:    Patient ID: Michele Gillespie, female    DOB: 08-24-1976, 37 y.o.   MRN: [id number]  HPI Ms. Kennley Schwandt is a 37 year old woman with asthma, history of DVT/PE on coumadin, cervical mass being worked up by gyn (submucosal fibroid vs. nabothian cyst) who presents today for rule out of urinary tract infection.  The patient states that yesterday she noted a mild odor to her urine and some itching around the exterior of her vagina. She has been using scented soaps, which have given her "bacterial infections" in the past. It's not clear if she means urinary tract infections or vaginitis when she says this. She has been told to stop using scented soaps by her gynecologist, but she refuses. She denies dysuria, hematuria, frequency, urgency, abdominal pain, nausea, vomiting, fever, chills. She also denies abnormal vaginal discharge.   She does not want a pelvic exam because she just had one on July 21 when she went to visit the gynecologist for followup of her cervical mass. She has a ultrasound of the pelvis scheduled today, ordered by them.   Review of Systems  Constitutional: Negative for fever and chills.  Respiratory: Negative for shortness of breath.   Cardiovascular: Negative for chest pain.  Gastrointestinal: Negative for nausea, vomiting and diarrhea.  Endocrine: Negative for polyuria.  Genitourinary: Negative for dysuria, urgency, frequency, hematuria, flank pain, vaginal discharge, vaginal pain and pelvic pain.  Musculoskeletal: Negative for myalgias and arthralgias.  Skin: Negative for rash.  Neurological: Negative for weakness and numbness.       Objective:   Physical Exam  Constitutional: She is oriented to person, place, and time. She appears well-developed and well-nourished. No distress.  HENT:  Head: Normocephalic and atraumatic.  Cardiovascular: Normal rate, regular rhythm, normal heart sounds and intact distal pulses.  Exam reveals no gallop and no friction rub.    No murmur heard. Pulmonary/Chest: Effort normal and breath sounds normal. She has no wheezes.  Abdominal: Soft. Bowel sounds are normal. There is no tenderness.  Musculoskeletal: Normal range of motion.  Neurological: She is alert and oriented to person, place, and time.  Skin: Skin is warm and dry. No rash noted. She is not diaphoretic.          Assessment & Plan:

## 2013-04-28 LAB — MICROALBUMIN / CREATININE URINE RATIO: Microalb, Ur: 1.16 mg/dL (ref 0.00–1.89)

## 2013-05-05 ENCOUNTER — Ambulatory Visit: Payer: Medicaid Other | Admitting: Obstetrics and Gynecology

## 2013-05-17 ENCOUNTER — Ambulatory Visit: Payer: Medicaid Other

## 2013-05-20 ENCOUNTER — Telehealth: Payer: Self-pay | Admitting: *Deleted

## 2013-05-20 ENCOUNTER — Telehealth: Payer: Self-pay | Admitting: Internal Medicine

## 2013-05-20 NOTE — Telephone Encounter (Signed)
Pt called with same request as below.  Will forward to PCP and Dr Alexandria Lodge.

## 2013-05-20 NOTE — Telephone Encounter (Signed)
[facility] and spoke to the medical [facility].  I clarified the dose as outlined in Dr. Saralyn Pilar most recent [facility]coagulation Clinic note from late July.

## 2013-05-20 NOTE — Telephone Encounter (Signed)
I received a call from the [facility].  They state an INR for Michele Gillespie on August 13th was 1.7.  She has been refusing her warfarin since August 15th.  As documented in a previous note from today [facility] was given the most recent recommended warfarin dosing should she decide to restart her warfarin.
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
## 2013-05-26 ENCOUNTER — Telehealth: Payer: Self-pay | Admitting: *Deleted

## 2013-05-26 NOTE — Telephone Encounter (Signed)
Pt's boyfriend calls about coumadin again, he is upset and at this time i informed him that he was not listed as a contact and that he could rest that dr Josem Kaufmann and dr groce had spoken to the staff at the jail and the pt was being treated per guidelines. He became very irate and stated that he would come to the clinic and straighten things out because he could see he was getting no where with the person he was speaking with, i ask when he would be coming in and he hung up

## 2013-07-05 ENCOUNTER — Ambulatory Visit (INDEPENDENT_AMBULATORY_CARE_PROVIDER_SITE_OTHER): Payer: Medicaid Other | Admitting: Pharmacist

## 2013-07-05 DIAGNOSIS — D6859 Other primary thrombophilia: Secondary | ICD-10-CM

## 2013-07-05 DIAGNOSIS — Z7901 Long term (current) use of anticoagulants: Secondary | ICD-10-CM

## 2013-07-05 LAB — POCT INR: INR: 4

## 2013-07-05 NOTE — Patient Instructions (Signed)
Patient instructed to take medications as defined in the Anti-coagulation Track section of this encounter.  Patient instructed to take today's dose.  Patient verbalized understanding of these instructions.    

## 2013-07-05 NOTE — Progress Notes (Signed)
Anti-Coagulation Progress Note  JAMARA VARY is a 37 y.o. female who is currently on an anti-coagulation regimen.    RECENT RESULTS: Recent results are below, the most recent result is correlated with a dose of 92.5 mg. per week: Lab Results  Component Value Date   INR 4.0 07/05/2013   INR 1.90 04/26/2013   INR 3.0 03/22/2013    ANTI-COAG DOSE: Anticoagulation Dose Instructions as of 07/05/2013     [name] Tue Wed Thu Fri Sat   New Dose 12.5 mg 10 mg 12.5 mg 12.5 mg 10 mg 12.5 mg 12.5 mg       ANTICOAG SUMMARY: Anticoagulation Episode Summary   Current INR goal 2.0-3.0  Next INR check 07/19/2013  INR from last check 4.0! 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  Patient instructed to take  today's dose.  Patient verbalized understanding of these instructions.       FOLLOW-UP Return in 2 weeks (on 07/19/2013) for Follow up INR at 0930h.  Hulen Luster, III Pharm.D., CACP

## 2013-07-06 NOTE — Progress Notes (Signed)
I have reviewed Dr. Saralyn Pilar note, agree.  Patient is on anticoagulation for hypercoagulable state based on chart review.

## 2013-07-12 ENCOUNTER — Ambulatory Visit: Payer: Medicaid Other | Admitting: Internal Medicine

## 2013-07-16 ENCOUNTER — Other Ambulatory Visit: Payer: Self-pay

## 2013-07-16 DIAGNOSIS — Z1231 Encounter for screening mammogram for malignant neoplasm of breast: Secondary | ICD-10-CM

## 2013-07-19 ENCOUNTER — Ambulatory Visit: Payer: Medicaid Other | Admitting: Internal Medicine

## 2013-07-19 ENCOUNTER — Ambulatory Visit: Payer: Medicaid Other

## 2013-07-20 ENCOUNTER — Other Ambulatory Visit: Payer: Self-pay | Admitting: *Deleted

## 2013-07-20 DIAGNOSIS — J45909 Unspecified asthma, uncomplicated: Secondary | ICD-10-CM

## 2013-07-20 MED ORDER — OLOPATADINE HCL 0.2 % OP SOLN: 1.0000 [drp] | Freq: Every day | OPHTHALMIC | Status: DC

## 2013-07-26 ENCOUNTER — Encounter: Payer: Self-pay | Admitting: Internal Medicine

## 2013-07-26 ENCOUNTER — Ambulatory Visit: Payer: Medicaid Other | Admitting: Internal Medicine

## 2013-07-26 ENCOUNTER — Ambulatory Visit: Payer: Medicaid Other

## 2013-08-11 ENCOUNTER — Encounter (HOSPITAL_COMMUNITY): Payer: Self-pay | Admitting: Emergency Medicine

## 2013-08-11 ENCOUNTER — Emergency Department (HOSPITAL_COMMUNITY): Payer: Medicaid Other

## 2013-08-11 DIAGNOSIS — Z862 Personal history of diseases of the blood and blood-forming organs and certain disorders involving the immune mechanism: Secondary | ICD-10-CM | POA: Insufficient documentation

## 2013-08-11 DIAGNOSIS — Y9302 Activity, running: Secondary | ICD-10-CM | POA: Insufficient documentation

## 2013-08-11 DIAGNOSIS — Z7901 Long term (current) use of anticoagulants: Secondary | ICD-10-CM | POA: Insufficient documentation

## 2013-08-11 DIAGNOSIS — R296 Repeated falls: Secondary | ICD-10-CM | POA: Insufficient documentation

## 2013-08-11 DIAGNOSIS — Y929 Unspecified place or not applicable: Secondary | ICD-10-CM | POA: Insufficient documentation

## 2013-08-11 DIAGNOSIS — R791 Abnormal coagulation profile: Secondary | ICD-10-CM | POA: Insufficient documentation

## 2013-08-11 DIAGNOSIS — F17200 Nicotine dependence, unspecified, uncomplicated: Secondary | ICD-10-CM | POA: Insufficient documentation

## 2013-08-11 DIAGNOSIS — F209 Schizophrenia, unspecified: Secondary | ICD-10-CM | POA: Insufficient documentation

## 2013-08-11 DIAGNOSIS — S8990XA Unspecified injury of unspecified lower leg, initial encounter: Secondary | ICD-10-CM | POA: Insufficient documentation

## 2013-08-11 DIAGNOSIS — Z79899 Other long term (current) drug therapy: Secondary | ICD-10-CM | POA: Insufficient documentation

## 2013-08-11 DIAGNOSIS — J45909 Unspecified asthma, uncomplicated: Secondary | ICD-10-CM | POA: Insufficient documentation

## 2013-08-11 DIAGNOSIS — Z86711 Personal history of pulmonary embolism: Secondary | ICD-10-CM | POA: Insufficient documentation

## 2013-08-11 DIAGNOSIS — Z86718 Personal history of other venous thrombosis and embolism: Secondary | ICD-10-CM | POA: Insufficient documentation

## 2013-08-11 LAB — PROTIME-INR
INR: 1.31 (ref 0.00–1.49)
Prothrombin Time: 16 s — ABNORMAL HIGH (ref 11.6–15.2)
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
## 2013-08-12 ENCOUNTER — Emergency Department (HOSPITAL_COMMUNITY)
Admission: EM | Admit: 2013-08-12 | Discharge: 2013-08-12 | Disposition: A | Discharge status: Home / Self Care | Payer: Medicaid Other | Attending: Emergency Medicine | Admitting: Emergency Medicine

## 2013-08-12 ENCOUNTER — Ambulatory Visit: Payer: Medicaid Other

## 2013-08-12 DIAGNOSIS — J45909 Unspecified asthma, uncomplicated: Secondary | ICD-10-CM

## 2013-08-12 DIAGNOSIS — R791 Abnormal coagulation profile: Secondary | ICD-10-CM

## 2013-08-12 DIAGNOSIS — M79672 Pain in left foot: Secondary | ICD-10-CM

## 2013-08-12 MED ORDER — OLOPATADINE HCL 0.2 % OP SOLN: 1.0000 [drp] | Freq: Every day | OPHTHALMIC | Status: DC

## 2013-08-12 MED ORDER — OXYCODONE HCL 5 MG PO TABS: 5.0000 mg | ORAL_TABLET | Freq: Four times a day (QID) | ORAL | Status: DC | PRN

## 2013-08-12 MED ORDER — PREDNISONE 20 MG PO TABS: 40.0000 mg | ORAL_TABLET | Freq: Every day | ORAL | Status: DC

## 2013-08-12 NOTE — Progress Notes (Signed)
Orthopedic Tech Progress Note Patient Details:  [name] 1975/12/29 [id number]  Ortho Devices Type of Ortho Device: ASO;Crutches   [name] 08/12/2013, 2:26 AM

## 2013-08-12 NOTE — ED Provider Notes (Signed)
CSN: [id number]     Arrival date & time 08/11/13  2244 History   First MD Initiated Contact with Patient 08/12/13 0109     Chief Complaint  Patient presents with  . Foot Pain  . Labs Only   (Consider location/radiation/quality/duration/timing/severity/associated sxs/prior Treatment) HPI Comments: 37 y/o female presents for pain to medial arch at L midfoot x 2 weeks. Pain onset after running after her grandchildren. She denies associated fever, numbness/tingling, weakness, calf pain or claudication, pallor, and erythema.  Patient is a 37 y.o. female presenting with lower extremity pain. The history is provided by the patient. No language interpreter was used.  Foot Pain This is a new problem. The current episode started 1 to 4 weeks ago. The problem occurs constantly. The problem has been unchanged. Associated symptoms include arthralgias. Pertinent negatives include no fever, joint swelling, numbness or weakness. The symptoms are aggravated by walking (palpation). She has tried nothing for the symptoms. Improvement on treatment: n/a.    Past Medical History  Diagnosis Date  . Pulmonary embolism  September 07, 2005     her CT angiogram - positive for pulmonary emboli to several branches of the right lower lobe- relatively small clot burden, clear lung; patient started on Coumadin; CT angiogram on January 10, 2006 showed resolution of previously seen pulmonary emboli with minimal basilar atelectasis  . Lung nodule  June 10, 2008     stable tiny noduke noted along the minor fissure of the right lung on CT angio September 11, 09 -  stable for 2 years and consistent with benign disease  . Arm DVT (deep venous thromboembolism), acute  September 23, 2009, January 05, 2010     Doppler study significant with indeterminant age DVT involving the left upper extremity, Doppler performed January 05, 2010 consistent with acute DVT involving the left upper extremity  . Asthma   . Anemia 2007     microcytic  anemia, baseline hemoglobin 10-11, MCV at baseline 72-77, secondary to iron deficiency  . Schizophrenia   . Depression   . Leukopenia 2008     unclear etiology baseline WBC  2.8-3.7   Past Surgical History  Procedure Laterality Date  . Cesarean section       History of 5 C-section  . Tubal ligation    . Exploratory laparotomy with abdominal mass excision  02/2005   Family History  Problem Relation Age of Onset  . Hypertension Mother   . Heart disease Mother   . Diabetes Father   . Birth defects Maternal Aunt   . Birth defects Maternal Uncle   . Diabetes Paternal Grandmother    History  Substance Use Topics  . Smoking status: Current Every Day Smoker -- 1.00 packs/day    Types: Cigarettes  . Smokeless tobacco: Not on file  . Alcohol Use: Yes     Comment: occ beer   OB History   Grav Para Term Preterm Abortions TAB SAB Ect Mult Living   6 5 5  0 0 0 0 0 0 5     Review of Systems  Constitutional: Negative for fever.  Musculoskeletal: Positive for arthralgias. Negative for joint swelling.  Skin: Negative for color change and pallor.  Neurological: Negative for weakness and numbness.  All other systems reviewed and are negative.    Allergies  Review of patient's allergies indicates no known allergies.  Home Medications   Current Outpatient Rx  Name  Route  Sig  Dispense  Refill  . albuterol (PROVENTIL HFA;VENTOLIN  HFA) 108 (90 BASE) MCG/ACT inhaler   Inhalation   Inhale 2 puffs into the lungs every 6 (six) hours as needed for wheezing. For shortness of breath   1 Inhaler   3   . ARIPiprazole (ABILIFY) 10 MG tablet   Oral   Take 10 mg by mouth at bedtime.          Marland Kitchen HYDROcodone-acetaminophen (NORCO/VICODIN) 5-325 MG per tablet   Oral   Take 1-2 tablets by mouth every 6 (six) hours as needed for pain.   15 tablet   0   . methocarbamol (ROBAXIN) 500 MG tablet   Oral   Take 1 tablet (500 mg total) by mouth 2 (two) times daily.   20 tablet   0   .  naproxen (NAPROSYN) 375 MG tablet   Oral   Take 1 tablet (375 mg total) by mouth 2 (two) times daily.   20 tablet   0   . nicotine (NICODERM CQ - DOSED IN MG/24 HOURS) 14 mg/24hr patch   Transdermal   Place 1 patch onto the skin daily.         . Olopatadine HCl (PATADAY) 0.2 % SOLN   Ophthalmic   Apply 1 drop to eye daily.   1 Bottle   3   . oxyCODONE (ROXICODONE) 5 MG immediate release tablet   Oral   Take 1 tablet (5 mg total) by mouth every 6 (six) hours as needed for severe pain.   5 tablet   0   . predniSONE (DELTASONE) 20 MG tablet   Oral   Take 2 tablets (40 mg total) by mouth daily.   10 tablet   0   . terconazole (TERAZOL 7) 0.4 % vaginal cream   Vaginal   Place 1 applicator vaginally at bedtime.   45 g   0   . warfarin (COUMADIN) 5 MG tablet      Take by mouth 2&1/2 tablets all days of week except 3 tablets on Tuesdays/Fridays.   75 tablet   2    BP 132/93  Pulse 74  Temp(Src) 98 F (36.7 C) (Oral)  Resp 18  SpO2 100%  LMP 07/12/2013 Physical Exam  Nursing note and vitals reviewed. Constitutional: She is oriented to person, place, and time. She appears well-developed and well-nourished. No distress.  HENT:  Head: Normocephalic and atraumatic.  Eyes: Conjunctivae and EOM are normal. No scleral icterus.  Neck: Normal range of motion.  Cardiovascular: Normal rate, regular rhythm and intact distal pulses.   Pulses:      Dorsalis pedis pulses are 2+ on the right side, and 2+ on the left side.       Posterior tibial pulses are 2+ on the right side, and 2+ on the left side.  Pulmonary/Chest: Effort normal. No respiratory distress.  Musculoskeletal: Normal range of motion. She exhibits tenderness.       Left ankle: Normal.       Left lower leg: Normal.       Left foot: She exhibits tenderness and bony tenderness. She exhibits normal range of motion, no swelling, normal capillary refill, no crepitus and no deformity.       Feet:  Tenderness at  midfoot, specifically to to the medial arch of L foot. No swelling, erythema, heat to touch, or red linear streaking. Normal ROM. No calf TTP.  Neurological: She is alert and oriented to person, place, and time. She has normal reflexes.  No sensory or motor deficits appreciated.  DTRs normal and symmetric. Patient ambulatory with antalgic gait.  Skin: Skin is warm and dry. No rash noted. She is not diaphoretic. No erythema. No pallor.  Psychiatric: She has a normal mood and affect. Her behavior is normal.    ED Course  Procedures (including critical care time) Labs Review Labs Reviewed  PROTIME-INR - Abnormal; Notable for the following:    Prothrombin Time 16.0 (*)    All other components within normal limits   Imaging Review Dg Foot Complete Left  08/11/2013   CLINICAL DATA:  Status post fall; left foot pain.  EXAM: LEFT FOOT - COMPLETE 3+ VIEW  COMPARISON:  Left foot radiographs performed 10/07/2007  FINDINGS: There is no evidence of fracture or dislocation. The joint spaces are preserved. There is no evidence of talar subluxation; the subtalar joint is unremarkable in appearance. A small plantar calcaneal spur is incidentally seen.  No significant soft tissue abnormalities are seen.  IMPRESSION: No evidence of fracture or dislocation.   Electronically Signed   By: Roanna Raider M.D.   On: 08/11/2013 23:55    EKG Interpretation   None       MDM   1. Foot arch pain, left   2. Subtherapeutic international normalized ratio (INR)    Uncomplicated L foot pain. Patient neurovascularly intact and ambulatory in ED today. No signs of septic joint or cellulitic process. Xray negative for fracture or dislocation. Patient given ASO ankle and crutches for WBAT as well as Rx for prednisone and Roxicodone for symptoms as needed. Patient also desired to have her INR checked today. Patient on coumadin for hx of blood clots and has had difficulty maintaining stable therapeutic INR. Per patient, she  currently takes 2 tabs T/Sa and 2.5 tabs all other days of the week. She endorses f/u with her doctor on Monday for recheck of her INR. INR today subtherapeutic. Patient advised to take 2.5 tabs every day until follow up appointment to which she verbalizes understanding. Patient stable and appropriate for d/c with no unaddressed concerns and proper return precautions.  Filed Vitals:   08/11/13 2250 08/12/13 0118  BP: 136/82 132/93  Pulse: 75 74  Temp: 97.9 F (36.6 C) 98 F (36.7 C)  TempSrc: Oral Oral  Resp: 16 18  SpO2: 100% 100%       Antony Madura, PA-C 08/13/13 2140

## 2013-08-12 NOTE — Progress Notes (Signed)
Orthopedic Tech Progress Note Patient Details:  Rubena L Matin 01/31/1976 [id number]  Ortho Devices Type of Ortho Device: ASO;Crutches   Elizbeth Posa M 08/12/2013, 2:26 AM  

## 2013-08-14 NOTE — ED Provider Notes (Signed)
Medical screening examination/treatment/procedure(s) were performed by non-physician practitioner and as supervising physician I was immediately available for consultation/collaboration.   Sunnie Nielsen, MD 08/14/13 [phone]

## 2013-08-23 ENCOUNTER — Ambulatory Visit (INDEPENDENT_AMBULATORY_CARE_PROVIDER_SITE_OTHER): Payer: Medicaid Other | Admitting: Pharmacist

## 2013-08-23 DIAGNOSIS — Z7901 Long term (current) use of anticoagulants: Secondary | ICD-10-CM

## 2013-08-23 DIAGNOSIS — D6859 Other primary thrombophilia: Secondary | ICD-10-CM

## 2013-08-23 LAB — POCT INR: INR: 1.3

## 2013-08-23 MED ORDER — WARFARIN SODIUM 5 MG PO TABS: ORAL_TABLET | ORAL | Status: DC

## 2013-08-23 NOTE — Patient Instructions (Signed)
Patient instructed to take medications as defined in the Anti-coagulation Track section of this encounter.  Patient instructed to take today's dose.  Patient verbalized understanding of these instructions.    

## 2013-08-23 NOTE — Progress Notes (Signed)
Anti-Coagulation Progress Note  Michele Gillespie is a 37 y.o. female who is currently on an anti-coagulation regimen.    RECENT RESULTS: Recent results are below, the most recent result is correlated with a dose of 82.5 mg. per week: Lab Results  Component Value Date   INR 1.3 08/23/2013   INR 1.31 08/11/2013   INR 4.0 07/05/2013    ANTI-COAG DOSE: Anticoagulation Dose Instructions as of 08/23/2013     [name] Tue Wed Thu Fri Sat   New Dose 12.5 mg 15 mg 12.5 mg 12.5 mg 15 mg 12.5 mg 12.5 mg       ANTICOAG SUMMARY: Anticoagulation Episode Summary   Current INR goal 2.0-3.0  Next INR check 09/06/2013  INR from last check 1.3! 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  Patient instructed to take today's dose.  Patient verbalized understanding of these instructions.       FOLLOW-UP Return in 2 weeks (on 09/06/2013) for Follow up INR at 1000h.  Hulen Luster, III Pharm.D., CACP

## 2013-09-06 ENCOUNTER — Ambulatory Visit: Payer: Medicaid Other

## 2013-09-06 ENCOUNTER — Encounter: Payer: Self-pay | Admitting: Internal Medicine

## 2013-09-09 ENCOUNTER — Emergency Department (HOSPITAL_COMMUNITY): Payer: Medicaid Other

## 2013-09-09 ENCOUNTER — Encounter (HOSPITAL_COMMUNITY): Payer: Self-pay | Admitting: Emergency Medicine

## 2013-09-09 ENCOUNTER — Emergency Department (HOSPITAL_COMMUNITY)
Admission: EM | Admit: 2013-09-09 | Discharge: 2013-09-09 | Disposition: A | Discharge status: Home / Self Care | Payer: Medicaid Other | Attending: Emergency Medicine | Admitting: Emergency Medicine

## 2013-09-09 DIAGNOSIS — F17200 Nicotine dependence, unspecified, uncomplicated: Secondary | ICD-10-CM | POA: Insufficient documentation

## 2013-09-09 DIAGNOSIS — J45901 Unspecified asthma with (acute) exacerbation: Secondary | ICD-10-CM | POA: Insufficient documentation

## 2013-09-09 DIAGNOSIS — Z791 Long term (current) use of non-steroidal anti-inflammatories (NSAID): Secondary | ICD-10-CM | POA: Insufficient documentation

## 2013-09-09 DIAGNOSIS — Z79899 Other long term (current) drug therapy: Secondary | ICD-10-CM | POA: Insufficient documentation

## 2013-09-09 DIAGNOSIS — D649 Anemia, unspecified: Secondary | ICD-10-CM | POA: Insufficient documentation

## 2013-09-09 DIAGNOSIS — R112 Nausea with vomiting, unspecified: Secondary | ICD-10-CM | POA: Insufficient documentation

## 2013-09-09 DIAGNOSIS — R197 Diarrhea, unspecified: Secondary | ICD-10-CM | POA: Insufficient documentation

## 2013-09-09 DIAGNOSIS — R791 Abnormal coagulation profile: Secondary | ICD-10-CM | POA: Insufficient documentation

## 2013-09-09 DIAGNOSIS — IMO0002 Reserved for concepts with insufficient information to code with codable children: Secondary | ICD-10-CM | POA: Insufficient documentation

## 2013-09-09 DIAGNOSIS — F209 Schizophrenia, unspecified: Secondary | ICD-10-CM | POA: Insufficient documentation

## 2013-09-09 DIAGNOSIS — Z86718 Personal history of other venous thrombosis and embolism: Secondary | ICD-10-CM | POA: Insufficient documentation

## 2013-09-09 DIAGNOSIS — Z7901 Long term (current) use of anticoagulants: Secondary | ICD-10-CM | POA: Insufficient documentation

## 2013-09-09 DIAGNOSIS — R0600 Dyspnea, unspecified: Secondary | ICD-10-CM

## 2013-09-09 DIAGNOSIS — M79609 Pain in unspecified limb: Secondary | ICD-10-CM | POA: Insufficient documentation

## 2013-09-09 DIAGNOSIS — Z86711 Personal history of pulmonary embolism: Secondary | ICD-10-CM | POA: Insufficient documentation

## 2013-09-09 LAB — POCT I-STAT TROPONIN I: Troponin i, poc: 0.02 ng/mL (ref 0.00–0.08)

## 2013-09-09 LAB — BASIC METABOLIC PANEL
BUN: 10 mg/dL (ref 6–23)
CO2: 22 mEq/L (ref 19–32)
Calcium: 8.7 mg/dL (ref 8.4–10.5)
Chloride: 107 mEq/L (ref 96–112)
Creatinine, Ser: 0.9 mg/dL (ref 0.50–1.10)
GFR calc Af Amer: >90 mL/min (ref >90)
Glucose, Bld: 88 mg/dL (ref 70–99)
Potassium: 3.4 mEq/L — ABNORMAL LOW (ref 3.5–5.1)

## 2013-09-09 LAB — CBC WITH DIFFERENTIAL/PLATELET
Basophils Absolute: 0 10*3/uL (ref 0.0–0.1)
Basophils Relative: 1 % (ref 0–1)
Eosinophils Relative: 2 % (ref 0–5)
HCT: 29 % — ABNORMAL LOW (ref 36.0–46.0)
Hemoglobin: 8.8 g/dL — ABNORMAL LOW (ref 12.0–15.0)
Lymphocytes Relative: 46 % (ref 12–46)
MCH: 19.7 pg — ABNORMAL LOW (ref 26.0–34.0)
MCV: 64.9 fL — ABNORMAL LOW (ref 78.0–100.0)
Monocytes Relative: 7 % (ref 3–12)
Neutro Abs: 1.5 10*3/uL — ABNORMAL LOW (ref 1.7–7.7)
Platelets: 184 10*3/uL (ref 150–400)
RBC: 4.47 MIL/uL (ref 3.87–5.11)
WBC: 3.4 10*3/uL — ABNORMAL LOW (ref 4.0–10.5)

## 2013-09-09 LAB — OCCULT BLOOD, POC DEVICE: Fecal Occult Bld: NEGATIVE

## 2013-09-09 LAB — PROTIME-INR: INR: 1.26 (ref 0.00–1.49)

## 2013-09-09 MED ORDER — ONDANSETRON HCL 4 MG PO TABS: 4.0000 mg | ORAL_TABLET | Freq: Three times a day (TID) | ORAL | Status: DC | PRN

## 2013-09-09 MED ORDER — ENOXAPARIN SODIUM 100 MG/ML ~~LOC~~ SOLN
90.0000 mg | Freq: Once | SUBCUTANEOUS | Status: AC
  Administered 2013-09-09: 90 mg via SUBCUTANEOUS
  Filled 2013-09-09: qty 1

## 2013-09-09 MED ORDER — IOHEXOL 350 MG/ML SOLN
100.0000 mL | Freq: Once | INTRAVENOUS | Status: AC | PRN
  Administered 2013-09-09: 100 mL via INTRAVENOUS

## 2013-09-09 MED ORDER — ONDANSETRON HCL 4 MG/2ML IJ SOLN
4.0000 mg | Freq: Once | INTRAMUSCULAR | Status: AC
  Administered 2013-09-09: 4 mg via INTRAVENOUS
  Filled 2013-09-09: qty 2

## 2013-09-09 MED ORDER — ENOXAPARIN SODIUM 100 MG/ML ~~LOC~~ SOLN: 93.0000 mg | Freq: Two times a day (BID) | SUBCUTANEOUS | Status: DC

## 2013-09-09 MED ORDER — SODIUM CHLORIDE 0.9 % IV BOLUS (SEPSIS)
1000.0000 mL | Freq: Once | INTRAVENOUS | Status: AC
  Administered 2013-09-09: 1000 mL via INTRAVENOUS

## 2013-09-09 MED ORDER — MORPHINE SULFATE 2 MG/ML IJ SOLN
2.0000 mg | Freq: Once | INTRAMUSCULAR | Status: AC
  Administered 2013-09-09: 2 mg via INTRAVENOUS
  Filled 2013-09-09: qty 1

## 2013-09-09 NOTE — ED Provider Notes (Signed)
Medical screening examination/treatment/procedure(s) were conducted as a shared visit with non-physician practitioner(s) or resident and myself. I personally evaluated the patient during the encounter and agree with the findings and plan unless otherwise indicated.  I have personally reviewed any xrays and/ or EKG's with the provider and I agree with interpretation.  DVT/ PE hx, on coumadin, recent n/v presents with sob similar to PE hx. No cp. No syncope.  Well appearing on exam, lungs clear, RRR, no leg swelling/ pain. Subth INR. CT angio no acute PE.  Vitals okay in ED. Plan for lovenox and close fup of INR and recheck with pcp.  Dyspnea, PE hx, Subtherapeutic INR  Dg Chest 2 View  09/09/2013 CLINICAL DATA: Nausea, vomiting, shortness of breath. EXAM: CHEST 2 VIEW COMPARISON: 03/14/2013 FINDINGS: The heart size and mediastinal contours are within normal limits. Both lungs are clear. The visualized skeletal structures are unremarkable. IMPRESSION: No active cardiopulmonary disease. Electronically Signed By: Elige Ko On: 09/09/2013 13:44  Ct Angio Chest Pe W/cm &/or Wo Cm  09/09/2013 CLINICAL DATA: 37 year old with shortness of breath. EXAM: CT ANGIOGRAPHY CHEST WITH CONTRAST TECHNIQUE: Multidetector CT imaging of the chest was performed using the standard protocol during bolus administration of intravenous contrast. Multiplanar CT image reconstructions including MIPs were obtained to evaluate the vascular anatomy. CONTRAST: OMNIPAQUE IOHEXOL 350 MG/ML SOLN COMPARISON: 03/14/2013 FINDINGS: Negative for pulmonary embolism. Small lymph nodes in the mediastinum without significant adenopathy. Again noted is a calcification in the right subcarinal region and calcifications in the right hilum. Findings are most compatible with old granulomatous disease. No gross abnormality to the thoracic aorta or great vessels. Imaging of the liver, spleen and pancreas are within normal limits. Cannot exclude  high-density sludge in the gallbladder. No gross abnormality to the adrenal glands or visualized kidneys. The trachea and mainstem bronchi are patent. Small calcified granuloma in the right lower lobe. Evidence for mild bibasilar atelectasis. There is a stable 3 mm nodule in the right upper lobe on sequence 6, image 59. Stable nodule in the right middle lobe on sequence 6, image 78 roughly measures 2 mm. These nodules have been stable since 06/10/2008 and likely benign. No acute bone abnormality. Review of the MIP images confirms the above findings. IMPRESSION: Negative for pulmonary embolism. No acute chest abnormalities. Evidence for old granulomatous disease. Electronically Signed By: Richarda Overlie M.D. On: 09/09/2013 15:21    Enid Skeens, MD 09/09/13 [phone]

## 2013-09-09 NOTE — ED Notes (Signed)
Dr Zavitz at bedside  

## 2013-09-09 NOTE — ED Provider Notes (Signed)
CSN: [id number]     Arrival date & time 09/09/13  1213 History   First MD Initiated Contact with Patient 09/09/13 1221     Chief Complaint  Patient presents with  . Shortness of Breath  . 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  Pt has had N/V/D x 6 days, emesis "tastes like pills."  No bloody emesis or stool.  Denies fevers, chest pain, cough, extremity swelling, abdominal pain.   Past Medical History  Diagnosis Date  . Pulmonary embolism  September 07, 2005     her CT angiogram - positive for pulmonary emboli to several branches of the right lower lobe- relatively small clot burden, clear lung; patient started on Coumadin; CT angiogram on January 10, 2006 showed resolution of previously seen pulmonary emboli with minimal basilar atelectasis  . Lung nodule  June 10, 2008     stable tiny noduke noted along the minor fissure of the right lung on CT angio September 11, 09 -  stable for 2 years and consistent with benign disease  . Arm DVT (deep venous thromboembolism), acute  September 23, 2009, January 05, 2010     Doppler study significant with indeterminant age DVT involving the left upper extremity, Doppler performed January 05, 2010 consistent with acute DVT involving the left upper extremity  . Asthma   . Anemia 2007     microcytic anemia, baseline hemoglobin 10-11, MCV at baseline 72-77, secondary to iron deficiency  . Schizophrenia   . Depression   . Leukopenia 2008     unclear etiology baseline WBC  2.8-3.7   Past Surgical History  Procedure Laterality Date  . Cesarean section       History of 5 C-section  . Tubal ligation    . Exploratory laparotomy with abdominal mass excision  02/2005   Family History  Problem Relation Age of Onset  . Hypertension Mother   . Heart  disease Mother   . Diabetes Father   . Birth defects Maternal Aunt   . Birth defects Maternal Uncle   . Diabetes Paternal Grandmother    History  Substance Use Topics  . Smoking status: Current Every Day Smoker -- 1.00 packs/day    Types: Cigarettes  . Smokeless tobacco: Not on file  . Alcohol Use: Yes     Comment: occ beer   OB History   Grav Para Term Preterm Abortions TAB SAB Ect Mult Living   6 5 5  0 0 0 0 0 0 5     Review of Systems  Constitutional: Negative for fever.  Respiratory: Positive for shortness of breath. Negative for cough.   Cardiovascular: Negative for chest pain.  Gastrointestinal: Negative for nausea, vomiting, abdominal pain and diarrhea.  Genitourinary: Negative for dysuria, urgency and frequency.  Neurological: Negative for dizziness, weakness, light-headedness and numbness.    Allergies  Review of patient's allergies indicates no known allergies.  Home Medications   Current Outpatient Rx  Name  Route  Sig  Dispense  Refill  . albuterol (PROVENTIL HFA;VENTOLIN HFA) 108 (90 BASE) MCG/ACT inhaler   Inhalation   Inhale 2 puffs into the lungs every 6 (six) hours as needed for wheezing. For shortness of breath   1 Inhaler   3   . ARIPiprazole (ABILIFY) 10 MG tablet   Oral   Take 10 mg  by mouth at bedtime.          Marland Kitchen HYDROcodone-acetaminophen (NORCO/VICODIN) 5-325 MG per tablet   Oral   Take 1-2 tablets by mouth every 6 (six) hours as needed for pain.   15 tablet   0   . methocarbamol (ROBAXIN) 500 MG tablet   Oral   Take 1 tablet (500 mg total) by mouth 2 (two) times daily.   20 tablet   0   . naproxen (NAPROSYN) 375 MG tablet   Oral   Take 1 tablet (375 mg total) by mouth 2 (two) times daily.   20 tablet   0   . nicotine (NICODERM CQ - DOSED IN MG/24 HOURS) 14 mg/24hr patch   Transdermal   Place 1 patch onto the skin daily.         . Olopatadine HCl (PATADAY) 0.2 % SOLN   Ophthalmic   Apply 1 drop to eye daily.   1 Bottle    0   . oxyCODONE (ROXICODONE) 5 MG immediate release tablet   Oral   Take 1 tablet (5 mg total) by mouth every 6 (six) hours as needed for severe pain.   5 tablet   0   . predniSONE (DELTASONE) 20 MG tablet   Oral   Take 2 tablets (40 mg total) by mouth daily.   10 tablet   0   . terconazole (TERAZOL 7) 0.4 % vaginal cream   Vaginal   Place 1 applicator vaginally at bedtime.   45 g   0   . warfarin (COUMADIN) 5 MG tablet      Take by mouth 2&1/2 tablets all days of week except 3 tablets on Mondays and Thursdays.   75 tablet   2    BP 125/81  Pulse 82  Temp(Src) 98.3 F (36.8 C) (Oral)  Resp 18  SpO2 94%  LMP 07/12/2013 Physical Exam  Nursing note and vitals reviewed. Constitutional: She appears well-developed and well-nourished. No distress.  HENT:  Head: Normocephalic and atraumatic.  Neck: Neck supple.  Cardiovascular: Normal rate, regular rhythm and intact distal pulses.   Pulmonary/Chest: Effort normal and breath sounds normal. No respiratory distress. She has no wheezes. She has no rales. She exhibits no tenderness.  Abdominal: Soft. She exhibits no distension. There is no tenderness. There is no rebound and no guarding.  Genitourinary: Rectum normal.  No gross blood in rectum.   Musculoskeletal: She exhibits no edema.  Left arm without edema, erythema, tenderness.  Distal pulses intact.  Full AROM, sensation intact.   Neurological: She is alert.  Skin: She is not diaphoretic.    ED Course  Procedures (including critical care time) Labs Review Labs Reviewed  CBC WITH DIFFERENTIAL - Abnormal; Notable for the following:    WBC 3.4 (*)    Hemoglobin 8.8 (*)    HCT 29.0 (*)    MCV 64.9 (*)    MCH 19.7 (*)    RDW 18.7 (*)    Neutro Abs 1.5 (*)    All other components within normal limits  BASIC METABOLIC PANEL - Abnormal; Notable for the following:    Potassium 3.4 (*)    GFR calc non Af Amer 80 (*)    All other components within normal limits   PROTIME-INR - Abnormal; Notable for the following:    Prothrombin Time 15.5 (*)    All other components within normal limits  POCT I-STAT TROPONIN I  OCCULT BLOOD, POC DEVICE   Imaging Review  Dg Chest 2 View  09/09/2013   CLINICAL DATA:  Nausea, vomiting, shortness of breath.  EXAM: CHEST  2 VIEW  COMPARISON:  03/14/2013  FINDINGS: The heart size and mediastinal contours are within normal limits. Both lungs are clear. The visualized skeletal structures are unremarkable.  IMPRESSION: No active cardiopulmonary disease.   Electronically Signed   By: Elige Ko   On: 09/09/2013 13:44   Ct Angio Chest Pe W/cm &/or Wo Cm  09/09/2013   CLINICAL DATA:  37 year old with shortness of breath.  EXAM: CT ANGIOGRAPHY CHEST WITH CONTRAST  TECHNIQUE: Multidetector CT imaging of the chest was performed using the standard protocol during bolus administration of intravenous contrast. Multiplanar CT image reconstructions including MIPs were obtained to evaluate the vascular anatomy.  CONTRAST:  OMNIPAQUE IOHEXOL 350 MG/ML SOLN  COMPARISON:  03/14/2013  FINDINGS: Negative for pulmonary embolism. Small lymph nodes in the mediastinum without significant adenopathy. Again noted is a calcification in the right subcarinal region and calcifications in the right hilum. Findings are most compatible with old granulomatous disease. No gross abnormality to the thoracic aorta or great vessels. Imaging of the liver, spleen and pancreas are within normal limits. Cannot exclude high-density sludge in the gallbladder. No gross abnormality to the adrenal glands or visualized kidneys.  The trachea and mainstem bronchi are patent. Small calcified granuloma in the right lower lobe. Evidence for mild bibasilar atelectasis. There is a stable 3 mm nodule in the right upper lobe on sequence 6, image 59. Stable nodule in the right middle lobe on sequence 6, image 78 roughly measures 2 mm. These nodules have been stable since 06/10/2008  and likely benign. No acute bone abnormality.  Review of the MIP images confirms the above findings.  IMPRESSION: Negative for pulmonary embolism.  No acute chest abnormalities.  Evidence for old granulomatous disease.   Electronically Signed   By: Richarda Overlie M.D.   On: 09/09/2013 15:21    EKG Interpretation    Date/Time:  Thursday September 09 2013 12:21:23 EST Ventricular Rate:  81 PR Interval:  144 QRS Duration: 82 QT Interval:  392 QTC Calculation: 455 R Axis:   55 Text Interpretation:  Normal sinus rhythm Possible Anterior infarct , age undetermined Abnormal ECG Confirmed by ZAVITZ  MD, JOSHUA ([id number]) on 09/09/2013 1:10:57 PM           1:12 PM Discussed patient with Dr Jodi Mourning who will also see the patient.   Subtherapeutic INR and anemia.  Pt states she has heavy periods, LMP 1 month ago.  Denies any current known bleeding.      MDM   1. Dyspnea   2. Anemia   3. History of pulmonary embolism   4. Subtherapeutic international normalized ratio (INR)   5. Nausea vomiting and diarrhea    Pt with hx PE and DVT presents with left upper arm pain and SOB without CP that began this morning.  Pt has had N/V/D for several days.  She is clinically well hydrated.  CT angio chest is negative for PE.  Exam of left upper extremity is not concerning for DVT.  INR is subtherapeutic.  Pt placed on Lovenox with close PCP follow up for INR check and coumadin adjustment as needed.  Pt is more anemic than usual but has no active bleeding.  Hemoccult stool card is negative.  Pt ambulates to and from bathroom without difficulty.  Pt comfortable with d/c home with close follow up.  Pt d/c home with zofran  and lovenox.  Discussed result, findings, treatment, and follow up  with patient.  Pt given return precautions.  Pt verbalizes understanding and agrees with plan.       Trixie Dredge, PA-C 09/09/13 [phone]

## 2013-09-09 NOTE — ED Notes (Signed)
Pt states that she has been sob and having left arm pain since this am ( pt has hx of multiple DVT and PE )  she has also had n/v since sat

## 2013-09-09 NOTE — ED Notes (Signed)
Pt denies chest pain.  Just c/o abdominal discomfort "as if she is going to have diarrhea".

## 2013-09-09 NOTE — ED Notes (Signed)
Pt given 2 warm blankets

## 2013-09-09 NOTE — ED Notes (Signed)
PT back from CT with ice pack to R arm.  States she began experiencing pain to IV site when IV contrast was being pushed.  No infiltration noted.  SL flushed without difficulty.

## 2013-09-14 ENCOUNTER — Encounter: Payer: Self-pay | Admitting: Internal Medicine

## 2013-09-14 ENCOUNTER — Ambulatory Visit (INDEPENDENT_AMBULATORY_CARE_PROVIDER_SITE_OTHER): Payer: Medicaid Other | Admitting: Internal Medicine

## 2013-09-14 VITALS — BP 118/82 | HR 68 | Temp 98.3°F | Ht 65.5 in | Wt 206.0 lb

## 2013-09-14 DIAGNOSIS — N6310 Unspecified lump in the right breast, unspecified quadrant: Secondary | ICD-10-CM | POA: Insufficient documentation

## 2013-09-14 DIAGNOSIS — Z0000 Encounter for general adult medical examination without abnormal findings: Secondary | ICD-10-CM | POA: Insufficient documentation

## 2013-09-14 DIAGNOSIS — J45909 Unspecified asthma, uncomplicated: Secondary | ICD-10-CM

## 2013-09-14 DIAGNOSIS — F17200 Nicotine dependence, unspecified, uncomplicated: Secondary | ICD-10-CM

## 2013-09-14 DIAGNOSIS — Z8659 Personal history of other mental and behavioral disorders: Secondary | ICD-10-CM

## 2013-09-14 DIAGNOSIS — Z7901 Long term (current) use of anticoagulants: Secondary | ICD-10-CM

## 2013-09-14 DIAGNOSIS — E876 Hypokalemia: Secondary | ICD-10-CM

## 2013-09-14 DIAGNOSIS — K219 Gastro-esophageal reflux disease without esophagitis: Secondary | ICD-10-CM

## 2013-09-14 DIAGNOSIS — D509 Iron deficiency anemia, unspecified: Secondary | ICD-10-CM

## 2013-09-14 DIAGNOSIS — D649 Anemia, unspecified: Secondary | ICD-10-CM

## 2013-09-14 HISTORY — DX: Encounter for general adult medical examination without abnormal findings: Z00.00

## 2013-09-14 LAB — BASIC METABOLIC PANEL WITH GFR
BUN: 10 mg/dL (ref 6–23)
Calcium: 8.9 mg/dL (ref 8.4–10.5)
Creat: 1.14 mg/dL — ABNORMAL HIGH (ref 0.50–1.10)
GFR, Est African American: 70 mL/min
GFR, Est Non African American: 61 mL/min
Glucose, Bld: 92 mg/dL (ref 70–99)
Potassium: 3.8 mEq/L (ref 3.5–5.3)

## 2013-09-14 LAB — PROTIME-INR: INR: 1.06 (ref <1.50)

## 2013-09-14 LAB — CBC
HCT: 28.5 % — ABNORMAL LOW (ref 36.0–46.0)
MCHC: 30.5 g/dL (ref 30.0–36.0)
Platelets: 186 10*3/uL (ref 150–400)
RDW: 19.2 % — ABNORMAL HIGH (ref 11.5–15.5)
WBC: 2.9 10*3/uL — ABNORMAL LOW (ref 4.0–10.5)

## 2013-09-14 MED ORDER — ARIPIPRAZOLE 10 MG PO TABS: 10.0000 mg | ORAL_TABLET | Freq: Every day | ORAL | Status: DC

## 2013-09-14 MED ORDER — ENOXAPARIN SODIUM 100 MG/ML ~~LOC~~ SOLN: 93.0000 mg | Freq: Two times a day (BID) | SUBCUTANEOUS | Status: DC

## 2013-09-14 MED ORDER — WARFARIN SODIUM 5 MG PO TABS: 12.5000 mg | ORAL_TABLET | Freq: Every day | ORAL | Status: DC

## 2013-09-14 MED ORDER — OLOPATADINE HCL 0.2 % OP SOLN: 1.0000 [drp] | Freq: Every day | OPHTHALMIC | Status: DC

## 2013-09-14 MED ORDER — PANTOPRAZOLE SODIUM 20 MG PO TBEC: 20.0000 mg | DELAYED_RELEASE_TABLET | Freq: Every day | ORAL | Status: DC

## 2013-09-14 MED ORDER — FERROUS SULFATE 325 (65 FE) MG PO TABS: 325.0000 mg | ORAL_TABLET | Freq: Three times a day (TID) | ORAL | Status: DC

## 2013-09-14 NOTE — Patient Instructions (Addendum)
Continue to take the Lovenox 2 times a day. Restart the Coumadin per Dr. Saralyn Pilar instructions: Take 2.5-3 tablets (12.5-15 mg total) by mouth daily. Mondays and Thursdays: 3 tablets (15 mg) Sunday, Tuesday, Wednesday, Friday, Saturday: 2.5 tablets (12.5 mg)  You have an appointment with Dr. Alexandria Lodge on Thursday at 10am.   Take the Protonix daily. This will help with the reflux and abdominal pain.

## 2013-09-14 NOTE — Assessment & Plan Note (Signed)
Pt states that she was diagnosed with acid reflux recently. She is not taking a PPI. She c/o of mid epigastric pain and is mildly TTP. FOBT negative, so I do not think that she is actively bleeding, Hgb stable today. Starting PPI today and hopefull this will help with her abdominal pain.

## 2013-09-14 NOTE — Assessment & Plan Note (Signed)
Mammogram has already been ordered for the patient. She requests referrals to Podiatry and a dental referral, which were made today.

## 2013-09-14 NOTE — Assessment & Plan Note (Addendum)
Potassium 3.4 in the ED on 12/11. 3.8 today.

## 2013-09-14 NOTE — Assessment & Plan Note (Addendum)
Pt with a h/o iron def anemia. She is supposed to be taking iron supplements but stopped them sometime over the past 6 months. CBC today with stable Hgb from 12/11. MCV 65.1. FOBT is negative, so I do not think she has an active GI bleed. Pt states that she was recently diagnosed with fibroids, so possibly her anemia is related to her menses, as she states that she has heavy periods. Will continue her anticoagulation for her PE as her Hgb remains stable.  - Restarting iron supplementation. - F/u in 1 mo and repeat CBC

## 2013-09-14 NOTE — Assessment & Plan Note (Addendum)
She states that the holidays can be a tough time for her b/c she lost family. No active depression. Pt currently on Abilify. She request a refill of this prescription. Providing rx but told the patient that she needs to f/u with her mental health provider.  - Abilify 10mg  daily

## 2013-09-14 NOTE — Assessment & Plan Note (Signed)
  Assessment: Progress toward smoking cessation:  smoking the same amount and maybe a little more during the holiday season 2/2 increased stressors. Barriers to progress toward smoking cessation:     Plan: Instruction/counseling given:  I counseled patient on the dangers of tobacco use, advised patient to stop smoking, and reviewed strategies to maximize success. Educational resources provided:  [employer]) brochure

## 2013-09-14 NOTE — Progress Notes (Signed)
Patient ID: Michele Gillespie, female   DOB: 1976/04/08, 37 y.o.   MRN: [id number]  Subjective:   Patient ID: Michele Gillespie female   DOB: Mar 15, 1976 37 y.o.   MRN: [id number]  HPI: [name] is a 37 y.o. F w/ PMH asthma, DVT/PE on Coumadin, uterine fibroids, and GERD, presents to day for an ED f/u after she was found to be anemic.   Pt is on Coumadin at home for PE but has not taken any since 12/10 b/c she ran out of her medication. She presented to the ED on 12/11 c/o SOB and left arm pain simliar to when she had her PE. A CTA was negative, but she was found to be subtherapeutic. Lovenox was started BID by the ED provider but no additional Coumadin was prescribed. She was to see Dr. Alexandria Lodge last Monday but did not go to the appt b/c she was not feeling well.   She also endorsed N/V/D x6 days while in the ED. She states that she was given pills for the nausea by the ED physician, but she is still having diarrhea. She states that over the past few days her stools have been really dark and watery, which has improved over the past few days and has completely resolved today. She denies any further N/V. She does have a h/o diverticulitis. She also endorses stomach pain, which she states has been worked up with a "barium test 4-5 months ago" and a colonoscopy. We do not have these results. Per the patient, acid reflux and diverticulosis were found. She states that fibroids were also found, which could all be contributing to her pain. She continues to c/o abdominal pain in the mid epigastrium.   In the ED she was also found to be anemic to 8.8, which is lower than her baseline Hgb of about 10.5 over the past year. She does have a h/o of iron def anemia and is not on Fe replacement currently.   She is not only any antireflux medication.   Past Medical History  Diagnosis Date  . Pulmonary embolism  September 07, 2005     her CT angiogram - positive for pulmonary emboli to several branches of the right lower  lobe- relatively small clot burden, clear lung; patient started on Coumadin; CT angiogram on January 10, 2006 showed resolution of previously seen pulmonary emboli with minimal basilar atelectasis  . Lung nodule  June 10, 2008     stable tiny noduke noted along the minor fissure of the right lung on CT angio September 11, 09 -  stable for 2 years and consistent with benign disease  . Arm DVT (deep venous thromboembolism), acute  September 23, 2009, January 05, 2010     Doppler study significant with indeterminant age DVT involving the left upper extremity, Doppler performed January 05, 2010 consistent with acute DVT involving the left upper extremity  . Asthma   . Anemia 2007     microcytic anemia, baseline hemoglobin 10-11, MCV at baseline 72-77, secondary to iron deficiency  . Schizophrenia   . Depression   . Leukopenia 2008     unclear etiology baseline WBC  2.8-3.7   Current Outpatient Prescriptions  Medication Sig Dispense Refill  . albuterol (PROVENTIL HFA;VENTOLIN HFA) 108 (90 BASE) MCG/ACT inhaler Inhale 2 puffs into the lungs every 6 (six) hours as needed for wheezing. For shortness of breath  1 Inhaler  3  . ARIPiprazole (ABILIFY) 10 MG tablet Take 10 mg by mouth  daily.       . enoxaparin (LOVENOX) 100 MG/ML injection Inject 0.95 mLs (95 mg total) into the skin every 12 (twelve) hours.  20 mL  0  . nicotine (NICODERM CQ - DOSED IN MG/24 HOURS) 14 mg/24hr patch Place 1 patch onto the skin daily.      . Olopatadine HCl (PATADAY) 0.2 % SOLN Apply 1 drop to eye daily.  1 Bottle  0  . ondansetron (ZOFRAN) 4 MG tablet Take 1 tablet (4 mg total) by mouth every 8 (eight) hours as needed for nausea or vomiting.  12 tablet  0  . warfarin (COUMADIN) 5 MG tablet Take 12.5-15 mg by mouth daily. Mondays and Thursdays: 3 tablets (15 mg) Sunday, Tuesday, Wednesday, Friday, Saturday: 2.5 tablets (12.5 mg)       No current facility-administered medications for this visit.   Family History  Problem  Relation Age of Onset  . Hypertension Mother   . Heart disease Mother   . Diabetes Father   . Birth defects Maternal Aunt   . Birth defects Maternal Uncle   . Diabetes Paternal Grandmother    History   Social History  . Marital Status: Married    Spouse Name: N/A    Number of Children: N/A  . Years of Education: N/A   Social History Main Topics  . Smoking status: Current Every Day Smoker -- 0.50 packs/day    Types: Cigarettes  . Smokeless tobacco: None  . Alcohol Use: Yes     Comment: occ beer  . Drug Use: Yes    Special: Marijuana  . Sexual Activity: Yes    Birth Control/ Protection: None   Other Topics Concern  . 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  Gastrointestinal: +nausea/vomiting (resolved), abdominal pain, diarrhea Genitourinary: Denies dysuria, urgency, frequency Neurological: Denies dizziness, light-headedness  Psychiatric/Behavioral: Denies suicidal ideation, +depression during the holidays   Objective:  Physical Exam: Filed Vitals:   09/14/13 1416  BP: 118/82  Pulse: 68  Temp: 98.3 F (36.8 C)  TempSrc: Oral  Height: 5' 5.5" (1.664 m)  Weight: 206 lb (93.441 kg)  SpO2: 100%   Constitutional: Vital signs reviewed.  Patient is a well-developed and well-nourished female in no acute distress and cooperative with exam.  Head: Normocephalic and atraumatic Eyes: PERRL, EOMI Neck: Trachea midline, normal ROM Cardiovascular: RRR, no MRG, pulses symmetric and intact bilaterally Pulmonary/Chest: Normal respiratory effort, CTAB, no  wheezes, rales, or rhonchi Abdominal: Soft. Mild TTP in mid epigastrium, non-distended, bowel sounds are hypoactive, no masses, organomegaly, or guarding present.  Rectal: No gross blood on exam. Fecal occult blood is negative. Musculoskeletal: No joint deformities, erythema, or stiffness Neurological: A&O x3, non focal Skin: Warm, dry and intact.  Psychiatric: Normal mood and affect. speech and behavior is normal.   Assessment & Plan:   Please refer to Problem List based Assessment and Plan

## 2013-09-14 NOTE — Assessment & Plan Note (Addendum)
Pt on lifelong anticoagulation but has not had her Coumadin since 12/10 b/c she ran out. She was seen in the ED on 12/11 due to SOB and left arm pain similar to when she had her PE. CTA was negative, but her INR was found to be sub therapeutic. She was started on Lovenox BID but her Coumadin was not refilled.  - Checking INR today, which will likely be even lower than on 12/11.  - Continue the Lovenox BID - Restarting her Coumadin today per Dr. Saralyn Pilar instructions: Take 2.5-3 tablets (12.5-15 mg total) by mouth daily. Mondays and Thursdays: 3 tablets (15 mg). Sunday, Tuesday, Wednesday, Friday, Saturday: 2.5 tablets (12.5 mg) - F/u with Dr. Alexandria Lodge Thursday @ 10:00am

## 2013-09-15 ENCOUNTER — Ambulatory Visit: Payer: Medicaid Other

## 2013-09-16 ENCOUNTER — Ambulatory Visit (INDEPENDENT_AMBULATORY_CARE_PROVIDER_SITE_OTHER): Payer: Medicaid Other | Admitting: Pharmacist

## 2013-09-16 DIAGNOSIS — Z7901 Long term (current) use of anticoagulants: Secondary | ICD-10-CM

## 2013-09-16 DIAGNOSIS — D6859 Other primary thrombophilia: Secondary | ICD-10-CM

## 2013-09-16 LAB — POCT INR: INR: 1.1

## 2013-09-16 NOTE — Patient Instructions (Signed)
Patient instructed to take medications as defined in the Anti-coagulation Track section of this encounter.  Patient instructed to take today's dose.  Patient instructed to continue enoxaparin/Lovenox syringes as she had previously been provided--injecting herself every 12 hours as previously instructed.  Patient verbalized understanding of these instructions.

## 2013-09-16 NOTE — Progress Notes (Signed)
Case discussed with Dr. Glenn at the time of the visit.  We reviewed the resident's history and exam and pertinent patient test results.  I agree with the assessment, diagnosis, and plan of care documented in the resident's note.   

## 2013-09-16 NOTE — Progress Notes (Signed)
Anti-Coagulation Progress Note  Michele Gillespie is a 37 y.o. female who is currently on an anti-coagulation regimen.    RECENT RESULTS: Recent results are below, the most recent result is correlated with a dose of 15mg  (3 tablets) on Mondays/Thursdays; 2&1/2 x 5mg  tablets (12.5mg ) on all other days + continuing enoxaparin/Lovenox 1mg /kg AQ q12h until seen by me on Monday 22-DEC-14 at 1000h.  Lab Results  Component Value Date   INR 1.1 09/16/2013   INR 1.06 09/14/2013   INR 1.26 09/09/2013    ANTI-COAG DOSE: Anticoagulation Dose Instructions as of 09/16/2013     [name] Tue Wed Thu Fri Sat   New Dose 12.5 mg 15 mg 12.5 mg 12.5 mg 15 mg 12.5 mg 12.5 mg       ANTICOAG SUMMARY: Anticoagulation Episode Summary   Current INR goal 2.0-3.0  Next INR check 09/20/2013  INR from last check 1.1! (09/16/2013)  Weekly max dose   Target end date Indefinite  [facility] check [facility]  Preferred lab   Send INR reminders to    Indications  HYPERCOAGULABLE STATE PRIMARY [289.81] Long term current use of anticoagulant [V58.61]        Comments         ANTICOAG TODAY: Anticoagulation Summary as of 09/16/2013   INR goal 2.0-3.0  Selected INR 1.1! (09/16/2013)  Next INR check 09/20/2013  Target end date Indefinite   Indications  HYPERCOAGULABLE STATE PRIMARY [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  Patient instructed to take today's dose.  Patient instructed to continue enoxaparin/Lovenox syringes as she had previously been provided--injecting herself every 12 hours as previously instructed.  Patient verbalized understanding of these instructions.        FOLLOW-UP Return in 4 days (on 09/20/2013) for Follow up INR at 1000h.  Hulen Luster, III Pharm.D., CACP

## 2013-09-20 ENCOUNTER — Encounter: Payer: Self-pay | Admitting: *Deleted

## 2013-09-20 ENCOUNTER — Ambulatory Visit: Payer: Medicaid Other

## 2013-10-06 ENCOUNTER — Encounter: Payer: Self-pay | Admitting: *Deleted

## 2013-10-08 ENCOUNTER — Encounter: Payer: Self-pay | Admitting: *Deleted

## 2013-10-09 ENCOUNTER — Other Ambulatory Visit: Payer: Self-pay | Admitting: Internal Medicine

## 2013-10-19 ENCOUNTER — Ambulatory Visit (INDEPENDENT_AMBULATORY_CARE_PROVIDER_SITE_OTHER): Payer: Medicaid Other | Admitting: Pharmacist

## 2013-10-19 DIAGNOSIS — D6859 Other primary thrombophilia: Secondary | ICD-10-CM

## 2013-10-19 DIAGNOSIS — Z7901 Long term (current) use of anticoagulants: Secondary | ICD-10-CM

## 2013-10-19 LAB — POCT INR
INR: 1.6
INR: 1.6

## 2013-10-19 NOTE — Progress Notes (Signed)
Anti-Coagulation Progress Note  Michele Gillespie is a 38 y.o. female who is currently on an anti-coagulation regimen.    RECENT RESULTS: Recent results are below, the most recent result is correlated with a dose of 92.5 mg. per week: Lab Results  Component Value Date   INR 1.60 10/19/2013   INR 1.6 10/19/2013   INR 1.1 09/16/2013    ANTI-COAG DOSE: Anticoagulation Dose Instructions as of 10/19/2013     [name] Tue Wed Thu Fri Sat   New Dose 12.5 mg 15 mg 15 mg 15 mg 15 mg 15 mg 12.5 mg       ANTICOAG SUMMARY: Anticoagulation Episode Summary   Current INR goal 2.0-3.0  Next INR check 11/01/2013  INR from last check 1.60! (10/19/2013)  Weekly max dose   Target end date Indefinite  [facility]  Preferred lab   Send INR reminders to    Indications  HYPERCOAGULABLE STATE PRIMARY [289.81] Long term current use of anticoagulant [V58.61]        Comments         ANTICOAG TODAY: Anticoagulation Summary as of 10/19/2013   INR goal 2.0-3.0  Selected INR 1.60! (10/19/2013)  Next INR check 11/01/2013  Target end date Indefinite   Indications  HYPERCOAGULABLE STATE PRIMARY [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  Patient instructed to take today's dose.  Patient verbalized understanding of these instructions.       FOLLOW-UP Return in 2 weeks (on 11/01/2013) for Follow up INR at 10:30AM.  [name], III Pharm.D., CACP

## 2013-10-19 NOTE — Patient Instructions (Signed)
Patient instructed to take medications as defined in the Anti-coagulation Track section of this encounter.  Patient instructed to take today's dose.  Patient verbalized understanding of these instructions.    

## 2013-10-20 NOTE — Progress Notes (Signed)
I have reviewed Dr. Gladstone Pih documentation.  [name] is on anticoagulation due to a hypercoagulable state.

## 2013-11-01 ENCOUNTER — Ambulatory Visit (INDEPENDENT_AMBULATORY_CARE_PROVIDER_SITE_OTHER): Payer: Medicaid Other | Admitting: Pharmacist

## 2013-11-01 DIAGNOSIS — Z7901 Long term (current) use of anticoagulants: Secondary | ICD-10-CM

## 2013-11-01 DIAGNOSIS — D6859 Other primary thrombophilia: Secondary | ICD-10-CM

## 2013-11-01 LAB — POCT INR: INR: 2.5

## 2013-11-01 MED ORDER — WARFARIN SODIUM 5 MG PO TABS
ORAL_TABLET | ORAL | Status: DC
Start: 2013-11-01 — End: 2013-11-15

## 2013-11-01 NOTE — Progress Notes (Signed)
Anti-Coagulation Progress Note  Michele Gillespie is a 38 y.o. female who is currently on an anti-coagulation regimen.    RECENT RESULTS: Recent results are below, the most recent result is correlated with a dose of 100 mg. per week: Lab Results  Component Value Date   INR 2.50 11/01/2013   INR 1.60 10/19/2013   INR 1.6 10/19/2013    ANTI-COAG DOSE: Anticoagulation Dose Instructions as of 11/01/2013     [name] Tue Wed Thu Fri Sat   New Dose 12.5 mg 15 mg 15 mg 15 mg 15 mg 15 mg 12.5 mg       ANTICOAG SUMMARY: Anticoagulation Episode Summary   Current INR goal 2.0-3.0  Next INR check 11/15/2013  INR from last check 2.50 (11/01/2013)  Weekly max dose   Target end date Indefinite  [facility]  Preferred lab   Send INR reminders to    Indications  HYPERCOAGULABLE STATE PRIMARY [289.81] Long term current use of anticoagulant [V58.61]        Comments         ANTICOAG TODAY: Anticoagulation Summary as of 11/01/2013   INR goal 2.0-3.0  Selected INR 2.50 (11/01/2013)  Next INR check 11/15/2013  Target end date Indefinite   Indications  HYPERCOAGULABLE STATE PRIMARY [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  Patient instructed to take today's dose.  Patient verbalized understanding of these instructions.       FOLLOW-UP Return in 2 weeks (on 11/15/2013) for Follow up INR at 1015h.  [name], III Pharm.D., CACP

## 2013-11-01 NOTE — Patient Instructions (Signed)
Patient instructed to take medications as defined in the Anti-coagulation Track section of this encounter.  Patient instructed to take today's dose.  Patient verbalized understanding of these instructions.    

## 2013-11-08 NOTE — Addendum Note (Signed)
Addended by: Hulan Fray on: 11/08/2013 06:06 PM   Modules accepted: Orders

## 2013-11-08 NOTE — Addendum Note (Signed)
Addended by: Hulan Fray on: 11/08/2013 06:12 PM   Modules accepted: Orders

## 2013-11-15 ENCOUNTER — Ambulatory Visit (INDEPENDENT_AMBULATORY_CARE_PROVIDER_SITE_OTHER): Payer: Medicaid Other | Admitting: Pharmacist

## 2013-11-15 DIAGNOSIS — Z7901 Long term (current) use of anticoagulants: Secondary | ICD-10-CM

## 2013-11-15 DIAGNOSIS — D6859 Other primary thrombophilia: Secondary | ICD-10-CM

## 2013-11-15 LAB — POCT INR: INR: 1.6

## 2013-11-15 MED ORDER — WARFARIN SODIUM 5 MG PO TABS: ORAL_TABLET | ORAL | Status: DC

## 2013-11-15 NOTE — Patient Instructions (Signed)
Patient instructed to take medications as defined in the Anti-coagulation Track section of this encounter.  Patient instructed to take today's dose.  Patient verbalized understanding of these instructions.    

## 2013-11-15 NOTE — Progress Notes (Signed)
Anti-Coagulation Progress Note  Michele Gillespie is a 38 y.o. female who is currently on an anti-coagulation regimen.    RECENT RESULTS: Recent results are below, the most recent result is correlated with a dose of 100 mg. per week: Lab Results  Component Value Date   INR 1.60 11/15/2013   INR 2.50 11/01/2013   INR 1.60 10/19/2013    ANTI-COAG DOSE: Anticoagulation Dose Instructions as of 11/15/2013     [name] Tue Wed Thu Fri Sat   New Dose 15 mg 20 mg 15 mg 15 mg 20 mg 15 mg 15 mg       ANTICOAG SUMMARY: Anticoagulation Episode Summary   Current INR goal 2.0-3.0  Next INR check 11/29/2013  INR from last check 1.60! (11/15/2013)  Weekly max dose   Target end date Indefinite  [facility]  Preferred lab   Send INR reminders to    Indications  HYPERCOAGULABLE STATE PRIMARY [289.81] Long term current use of anticoagulant [V58.61]        Comments         ANTICOAG TODAY: Anticoagulation Summary as of 11/15/2013   INR goal 2.0-3.0  Selected INR 1.60! 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  Patient instructed to take today's dose.  Patient verbalized understanding of these instructions.       FOLLOW-UP Return in 2 weeks (on 11/29/2013) for Follow up INR at 0930.  [name], III Pharm.D., CACP

## 2013-11-18 ENCOUNTER — Encounter: Payer: Self-pay | Admitting: Internal Medicine

## 2013-11-18 ENCOUNTER — Ambulatory Visit (INDEPENDENT_AMBULATORY_CARE_PROVIDER_SITE_OTHER): Payer: Medicaid Other | Admitting: Internal Medicine

## 2013-11-18 VITALS — BP 116/82 | HR 73 | Temp 99.0°F | Wt 207.6 lb

## 2013-11-18 DIAGNOSIS — Z0000 Encounter for general adult medical examination without abnormal findings: Secondary | ICD-10-CM

## 2013-11-18 DIAGNOSIS — K219 Gastro-esophageal reflux disease without esophagitis: Secondary | ICD-10-CM

## 2013-11-18 DIAGNOSIS — B9789 Other viral agents as the cause of diseases classified elsewhere: Principal | ICD-10-CM

## 2013-11-18 DIAGNOSIS — J45909 Unspecified asthma, uncomplicated: Secondary | ICD-10-CM

## 2013-11-18 DIAGNOSIS — J069 Acute upper respiratory infection, unspecified: Secondary | ICD-10-CM

## 2013-11-18 DIAGNOSIS — F17200 Nicotine dependence, unspecified, uncomplicated: Secondary | ICD-10-CM

## 2013-11-18 MED ORDER — HYDROCODONE-HOMATROPINE 5-1.5 MG/5ML PO SYRP: 5.0000 mL | ORAL_SOLUTION | Freq: Four times a day (QID) | ORAL | Status: DC | PRN

## 2013-11-18 MED ORDER — IPRATROPIUM BROMIDE 0.03 % NA SOLN: 2.0000 | Freq: Three times a day (TID) | NASAL | Status: DC

## 2013-11-18 NOTE — Assessment & Plan Note (Signed)
Mammogram already ordered.  Ulis Rias to call patient to set up appointment time.

## 2013-11-18 NOTE — Assessment & Plan Note (Signed)
Stable on Protonix. 

## 2013-11-18 NOTE — Patient Instructions (Signed)
We are prescribing you two medicines to help with your symptoms- one is a cough syrup, the other is a nasal spray to help decrease congestions. Please let us know if you are not feeling better by Monday 3/2.   We referred you for mammogram today.  They will call you to set up the appointment time.   Upper Respiratory Infection, Adult An upper respiratory infection (URI) is also known as the common cold. It is often caused by a type of germ (virus). Colds are easily spread (contagious). You can pass it to others by kissing, coughing, sneezing, or drinking out of the same glass. Usually, you get better in 1 or 2 weeks.  HOME CARE   Only take medicine as told by your doctor.  Use a warm mist humidifier or breathe in steam from a hot shower.  Drink enough water and fluids to keep your pee (urine) clear or pale yellow.  Get plenty of rest.  Return to work when your temperature is back to normal or as told by your doctor. You may use a face mask and wash your hands to stop your cold from spreading. GET HELP RIGHT AWAY IF:   After the first few days, you feel you are getting worse.  You have questions about your medicine.  You have chills, shortness of breath, or brown or red spit (mucus).  You have yellow or brown snot (nasal discharge) or pain in the face, especially when you bend forward.  You have a fever, puffy (swollen) neck, pain when you swallow, or white spots in the back of your throat.  You have a bad headache, ear pain, sinus pain, or chest pain.  You have a high-pitched whistling sound when you breathe in and out (wheezing).  You have a lasting cough or cough up blood.  You have sore muscles or a stiff neck. MAKE SURE YOU:   Understand these instructions.  Will watch your condition.  Will get help right away if you are not doing well or get worse. Document Released: 03/04/2008 Document Revised: 12/09/2011 Document Reviewed: 01/21/2011 [facility] Patient Information  2014 [place], [place].

## 2013-11-18 NOTE — Assessment & Plan Note (Signed)
See HPI.  Patient presents with cough and nasal congestion x 3 weeks.  On exam, lungs CTAB, throat clear.  No concern for PNA or sinusitis at this time.   Symptomatic management with Atrovent nasal spray and hydrocodone/homatropine cough syrup x 5 days (provided printed rx of both).  Counseled patient that cough may take up to 6 weeks to resolve.  She will call back on Monday 3/2 if her symptoms are not improved by that time.  Patient left before we could complete her AVS thus RN to mail.   Of note, she will likely need to be on allergy medicines chronically in future (would consider Claritan, Flonase).

## 2013-11-18 NOTE — Progress Notes (Signed)
Patient ID: GOLDY CALANDRA, female   DOB: 1976-02-26, 38 y.o.   MRN: [id number]   Subjective:   Patient ID: SKYLENE DEREMER female   DOB: [birth year] 38 y.o.   MRN: [id number]  HPI: Ms.Aayat L Weyenberg is a 38 y.o. 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   Patient states she has an infant grandson who often visits and gets her sick.  She started feeling poorly about 3 weeks ago, has been coughing and feeling congested since.  Cough is nonproductive, sometimes she coughs so hard that it makes her head hurt and she still feels like she can't cough enough to "get the phlegm up."  She had some green nasal discharge, but this has resolved.  She has not been as active lately due to feeling poorly but no fevers, myalgias, sore throat, or ear pain.  She has tried taking Tussion-DM, Mucinex, Halls cough drops as well as drinking orange juice with little relief.  She did not get a flu shot this year.   Patient states she has had bad allergies since she was a child but does not take any allergy medicines because she is concerned about interactions with Coumadin.  With regard to her asthma, she used to be an inhaled corticosteroid but discontinued it due to weight gain.  She has been using her Albuterol inhaler up to three times daily when climbing the stairs to her apartment and for this reason would like her PCP to write a letter stating she needs a first floor apartment.  She reports good medication compliance with Abilify, Protonix, and iron supplementation as well.  She feels that Protonix controls her GERD symptoms well; no constipation.  She has been using nicotine patches to help with smoking cessation, states she is down to 1-3 cigarettes per day (and sometimes none).   She would also like to set up appointment time for mammogram as she was incarcerated last time she was scheduled.  Of note, both of her sisters  passed away from breast cancer at ages 65 and 71.   Past Medical History  Diagnosis Date  . Pulmonary embolism  September 07, 2005     her CT angiogram - positive for pulmonary emboli to several branches of the right lower lobe- relatively small clot burden, clear lung; patient started on Coumadin; CT angiogram on January 10, 2006 showed resolution of previously seen pulmonary emboli with minimal basilar atelectasis  . Lung nodule  June 10, 2008     stable tiny noduke noted along the minor fissure of the right lung on CT angio September 11, 09 -  stable for 2 years and consistent with benign disease  . Arm DVT (deep venous thromboembolism), acute  September 23, 2009, January 05, 2010     Doppler study significant with indeterminant age DVT involving the left upper extremity, Doppler performed January 05, 2010 consistent with acute DVT involving the left upper extremity  . Asthma   . Anemia 2007     microcytic anemia, baseline hemoglobin 10-11, MCV at baseline 72-77, secondary to iron deficiency  . Schizophrenia   . Depression   . Leukopenia 2008     unclear etiology baseline WBC  2.8-3.7   Current Outpatient Prescriptions  Medication Sig Dispense Refill  . albuterol (PROVENTIL HFA;VENTOLIN HFA) 108 (90 BASE) MCG/ACT inhaler Inhale 2 puffs into the lungs every 6 (six) hours as needed for wheezing. For  shortness of breath  1 Inhaler  3  . ARIPiprazole (ABILIFY) 10 MG tablet Take 1 tablet (10 mg total) by mouth daily.  30 tablet  1  . ferrous sulfate 325 (65 FE) MG tablet Take 1 tablet (325 mg total) by mouth 3 (three) times daily with meals.  90 tablet  3  . nicotine (NICODERM CQ - DOSED IN MG/24 HOURS) 14 mg/24hr patch Place 1 patch onto the skin daily.      . Olopatadine HCl (PATADAY) 0.2 % SOLN Apply 1 drop to eye daily.  1 Bottle  1  . pantoprazole (PROTONIX) 20 MG tablet Take 1 tablet (20 mg total) by mouth daily.  30 tablet  1  . warfarin (COUMADIN) 5 MG tablet Take 4 tablets Monday/Thursday;  Take 3 tablets all other days.  100 tablet  2   No current facility-administered medications for this visit.   Family History  Problem Relation Age of Onset  . Hypertension Mother   . Heart disease Mother   . Diabetes Father   . Birth defects Maternal Aunt   . Birth defects Maternal Uncle   . Diabetes Paternal Grandmother    History   Social History  . Marital Status: Married    Spouse Name: N/A    Number of Children: N/A  . Years of Education: N/A   Social History Main Topics  . Smoking status: Current Every Day Smoker -- 0.30 packs/day    Types: Cigarettes  . Smokeless tobacco: None     Comment: wearing patches  . Alcohol Use: Yes     Comment: occ beer  . Drug Use: Yes    Special: Marijuana  . Sexual Activity: Yes    Birth Control/ Protection: None   Other Topics Concern  . None   Social History Narrative    Works as a Quarry manager, 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, has used cocaine in the past, history of multiple incarcerations last one in November 2011   Review of Systems: Review of Systems  Constitutional: Negative for fever, chills and weight loss.  HENT: Negative for ear pain and sore throat.        After coughing   Eyes: Negative for blurred vision.  Respiratory: Positive for cough. Negative for hemoptysis, sputum production, shortness of breath and wheezing.   Cardiovascular: Negative for chest pain and leg swelling.  Gastrointestinal: Positive for abdominal pain. Negative for nausea, vomiting, diarrhea and constipation.       Occasional pain "where my C section scars are"  Genitourinary: Negative for dysuria.  Musculoskeletal: Negative for falls.  Neurological: Positive for headaches. Negative for dizziness, loss of consciousness and weakness.    Objective:  Physical Exam: Filed Vitals:   11/18/13 1319  BP: 116/82  Pulse: 73  Temp: 99 F (37.2 C)  TempSrc: Oral  Weight: 207 lb 9.6 oz (94.167 kg)  SpO2: 100%   General: alert,  cooperative, and in no apparent distress HEENT: NCAT, vision grossly intact, oropharynx clear and non-erythematous  Neck: supple, no lymphadenopathy Lungs: clear to ascultation bilaterally, normal work of respiration, no wheezes, rales, ronchi Heart: regular rate and rhythm, no murmurs, gallops, or rubs Abdomen: soft, non-tender, non-distended, normal bowel sounds Extremities: 2+ DP/PT pulses bilaterally, no cyanosis, clubbing, or edema Neurologic: alert & oriented X3, cranial nerves II-XII intact, strength grossly intact, sensation intact to light touch  Assessment & Plan:  Patient discussed with Dr. Murlean Caller.  Please see problem-based assessment and plan.

## 2013-11-18 NOTE — Assessment & Plan Note (Signed)
Patient using nicotine patches, has cut back smoking to 1-3 cigarettes per day.  Congratulated her on this, encouraged continued efforts to quit.

## 2013-11-18 NOTE — Assessment & Plan Note (Signed)
Reinforced that Albuterol is rescue inhaler only.

## 2013-11-19 ENCOUNTER — Telehealth: Payer: Self-pay | Admitting: *Deleted

## 2013-11-19 NOTE — Telephone Encounter (Signed)
Pt is requesting a letter from pcp that states she should get an apartment on the ground level because of her asthma.  She states that she has to use her inhaler everything she goes up the stairs because she is so short of breath.    I informed pt that I will send request to pcp for review. Please advise.Michele Gillespie, [name]2/20/201511:03 AM

## 2013-11-22 NOTE — Progress Notes (Signed)
Case discussed with Dr. Rogers at time of visit.  We reviewed the resident's history and exam and pertinent patient test results.  I agree with the assessment, diagnosis, and plan of care documented in the resident's note. 

## 2013-11-23 NOTE — Telephone Encounter (Signed)
I am unable to accommodate this request at this time.  I have never seen nor evaluated this patient in the internal medicine clinic.  If she is becoming short of breath with exertion, I suggest she make an appointment in the clinic to be evaluated.  Thanks.

## 2013-11-29 ENCOUNTER — Ambulatory Visit (INDEPENDENT_AMBULATORY_CARE_PROVIDER_SITE_OTHER): Payer: Medicaid Other | Admitting: Pharmacist

## 2013-11-29 ENCOUNTER — Telehealth: Payer: Self-pay | Admitting: *Deleted

## 2013-11-29 DIAGNOSIS — Z7901 Long term (current) use of anticoagulants: Secondary | ICD-10-CM

## 2013-11-29 DIAGNOSIS — D6859 Other primary thrombophilia: Secondary | ICD-10-CM

## 2013-11-29 LAB — POCT INR: INR: 5.1

## 2013-11-29 NOTE — Patient Instructions (Signed)
Patient instructed to take medications as defined in the Anti-coagulation Track section of this encounter.  Patient instructed to take today's dose.  Patient verbalized understanding of these instructions.    

## 2013-11-29 NOTE — Telephone Encounter (Signed)
Pt informed.Michele Gillespie Cassady3/2/20153:33 PM

## 2013-11-29 NOTE — Progress Notes (Signed)
Anti-Coagulation Progress Note  Michele Gillespie is a 38 y.o. female who is currently on an anti-coagulation regimen.    RECENT RESULTS: Recent results are below, the most recent result is correlated with a dose of 115 mg. per week: Lab Results  Component Value Date   INR 5.10 11/29/2013   INR 1.60 11/15/2013   INR 2.50 11/01/2013    ANTI-COAG DOSE: Anticoagulation Dose Instructions as of 11/29/2013     Sun Mon Tue Wed Thu Fri Sat   New Dose 15 mg 15 mg 12.5 mg 15 mg 12.5 mg 15 mg 12.5 mg       ANTICOAG SUMMARY: Anticoagulation Episode Summary   Current INR goal 2.0-3.0  Next INR check 12/06/2013  INR from last check 5.10! (11/29/2013)  Weekly max dose   Target end date Indefinite  [facility]  Preferred lab   Send INR reminders to    Indications  HYPERCOAGULABLE STATE PRIMARY [289.81] Long term current use of anticoagulant [V58.61]        Comments         ANTICOAG TODAY: Anticoagulation Summary as of 11/29/2013   INR goal 2.0-3.0  Selected INR 5.10! (11/29/2013)  Next INR check 12/06/2013  Target end date Indefinite   Indications  HYPERCOAGULABLE STATE PRIMARY [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  Patient instructed to take today's dose.  Patient verbalized understanding of these instructions.       FOLLOW-UP Return in 7 days (on 12/06/2013) for Follow up INR at 1000h.  [name], III Pharm.D., CACP

## 2013-11-29 NOTE — Telephone Encounter (Signed)
This is the second time I have needed to intervene in Michele Gillespie's outrageous behavior. The first time I don't recall documenting because we were able to smooth things over after she called [facility] a "[place]" among other problems with her behavior, loud, trouble making and very aggressive.  Today she was very loud and became combative when asked for her co pay. She created a scene in the waiting room and again referred to [facility] as "little [place]" and she doesn't have time for this s___! She is a chronic no show patient and arrived at 1100 for a 0930 appointment today with Dr. Elie Confer, which I understand is routine. I was pulled out of a meeting after she was belligerent. I requested Michele Gillespie come to my office to get her away from other patients and she became aggressive with me, cursing, talking on her "phone", which I told her she shouldn't be using, and then asked why she was being treated like a child when she was 38 yo? I did what I could to remain calm and discuss her behavior, she got more angry and threatened to "get her man and then he will talk to me". Repeated x 2. I asked her if she was threatening me and she walked away and went back to the phone and would not do what I asked, it was none of my business. I just want my INR and don't have time for this s___!  Michele Gillespie, Lianne Cure al were witnesses to this behavior and could have overheard the threat, in the waiting room in front of other patients. I feel I demonstrated calmness when speaking with her and did not try to provoke any of this bad behavior. She was seen by Dr. Elie Confer and never paid the $3 co pay.  I am sensitive to her medical needs but also can not tolerate her treatment of my employees and a physical threat is subject to immediate dismissal. Please advise as I feel she is no longer welcome at [facility]. Thank you

## 2013-12-01 ENCOUNTER — Telehealth: Payer: Self-pay | Admitting: *Deleted

## 2013-12-01 NOTE — Telephone Encounter (Signed)
The office manager received a call from the daughter of a patient who overheard the situation and stated that Michele Gillespie was on the phone talking with someone about "getting her guns and coming in and shooting the place up", and that the statement was repeated. I did notice patients in the area and they could remember what I said as well, when repeated back to me so I know they heard the conversation and after I left.  After conferring with the medical director, I intend to dismiss the patient from the clinic as [facility] must not be a place she feels comfortable receiving care and this relationship is not therapeutic for anyone involved. The clinic social worker will attempt to locate another source of care for her.

## 2013-12-06 ENCOUNTER — Ambulatory Visit: Payer: Medicaid Other

## 2014-01-08 ENCOUNTER — Encounter (HOSPITAL_COMMUNITY): Payer: Self-pay | Admitting: Emergency Medicine

## 2014-01-08 ENCOUNTER — Emergency Department (HOSPITAL_COMMUNITY)
Admission: EM | Admit: 2014-01-08 | Discharge: 2014-01-08 | Disposition: A | Discharge status: Home / Self Care | Payer: Medicaid Other | Attending: Emergency Medicine | Admitting: Emergency Medicine

## 2014-01-08 DIAGNOSIS — Z7901 Long term (current) use of anticoagulants: Secondary | ICD-10-CM | POA: Insufficient documentation

## 2014-01-08 DIAGNOSIS — D649 Anemia, unspecified: Secondary | ICD-10-CM | POA: Insufficient documentation

## 2014-01-08 DIAGNOSIS — M273 Alveolitis of jaws: Secondary | ICD-10-CM

## 2014-01-08 DIAGNOSIS — F3289 Other specified depressive episodes: Secondary | ICD-10-CM | POA: Insufficient documentation

## 2014-01-08 DIAGNOSIS — F209 Schizophrenia, unspecified: Secondary | ICD-10-CM | POA: Insufficient documentation

## 2014-01-08 DIAGNOSIS — Z86711 Personal history of pulmonary embolism: Secondary | ICD-10-CM | POA: Insufficient documentation

## 2014-01-08 DIAGNOSIS — F17200 Nicotine dependence, unspecified, uncomplicated: Secondary | ICD-10-CM | POA: Insufficient documentation

## 2014-01-08 DIAGNOSIS — Z9889 Other specified postprocedural states: Secondary | ICD-10-CM | POA: Insufficient documentation

## 2014-01-08 DIAGNOSIS — Z86718 Personal history of other venous thrombosis and embolism: Secondary | ICD-10-CM | POA: Insufficient documentation

## 2014-01-08 DIAGNOSIS — J45909 Unspecified asthma, uncomplicated: Secondary | ICD-10-CM | POA: Insufficient documentation

## 2014-01-08 DIAGNOSIS — Z79899 Other long term (current) drug therapy: Secondary | ICD-10-CM | POA: Insufficient documentation

## 2014-01-08 DIAGNOSIS — F329 Major depressive disorder, single episode, unspecified: Secondary | ICD-10-CM | POA: Insufficient documentation

## 2014-01-08 LAB — PROTIME-INR
INR: 2.28 — AB (ref 0.00–1.49)
Prothrombin Time: 24.4 seconds — ABNORMAL HIGH (ref 11.6–15.2)

## 2014-01-08 LAB — CBC WITH DIFFERENTIAL/PLATELET
Basophils Absolute: 0 10*3/uL (ref 0.0–0.1)
Basophils Relative: 0 % (ref 0–1)
EOS ABS: 0 10*3/uL (ref 0.0–0.7)
Eosinophils Relative: 0 % (ref 0–5)
HCT: 37.1 % (ref 36.0–46.0)
HEMOGLOBIN: 12.3 g/dL (ref 12.0–15.0)
LYMPHS ABS: 0.4 10*3/uL — AB (ref 0.7–4.0)
Lymphocytes Relative: 4 % — ABNORMAL LOW (ref 12–46)
MCH: 30.4 pg (ref 26.0–34.0)
MCHC: 33.2 g/dL (ref 30.0–36.0)
MCV: 91.8 fL (ref 78.0–100.0)
MONOS PCT: 6 % (ref 3–12)
Monocytes Absolute: 0.6 10*3/uL (ref 0.1–1.0)
NEUTROS PCT: 90 % — AB (ref 43–77)
Neutro Abs: 8.1 10*3/uL — ABNORMAL HIGH (ref 1.7–7.7)
Platelets: 190 10*3/uL (ref 150–400)
RBC: 4.04 MIL/uL (ref 3.87–5.11)
RDW: 13.3 % (ref 11.5–15.5)
WBC: 9.2 10*3/uL (ref 4.0–10.5)

## 2014-01-08 MED ORDER — OXYCODONE-ACETAMINOPHEN 5-325 MG PO TABS: 2.0000 | ORAL_TABLET | Freq: Once | ORAL | Status: DC

## 2014-01-08 MED ORDER — OXYCODONE-ACETAMINOPHEN 5-325 MG PO TABS
2.0000 | ORAL_TABLET | Freq: Once | ORAL | Status: AC
  Administered 2014-01-08: 2 via ORAL
  Filled 2014-01-08: qty 2

## 2014-01-08 NOTE — ED Notes (Addendum)
Pt reports facial pain ongoing since having mouth surgery on the 31st. Pt also reports that she has not had her coumadin checked and has been self adjusting her coumadin. Pt tried tylenol at 1000 without relief. Pt denies abnormal bleeding at present.

## 2014-01-08 NOTE — ED Notes (Signed)
Patient discharged to home with family. NAD.  

## 2014-01-08 NOTE — Discharge Instructions (Signed)
Dental Dry Socket A dental dry socket can happen after a tooth is pulled. When a tooth gets pulled, it leaves a hole (socket). Normally, blood fills up the hole and hardens (clots). A dry socket happens if blood gets removed from the hole or does not fill the hole.  HOME CARE  Follow your dentist's instructions.  Only take medicines as told by your dentist.  Take your medicine (antibiotics) as told if you are given medicine. Finish them even if you start to feel better.  Wait 1 day to rinse your mouth with warm salt water. Spit water out very gently.  Avoid bubbly (carbonated) drinks.  Avoid alcohol.  Avoid smoking. GET HELP RIGHT AWAY IF:  Your medicine does not help your pain.  You have puffiness (swelling), severe pain, or you cannot stop bleeding.  You have a temperature by mouth above 102 F (38.9 C), not controlled by medicine.  You have trouble swallowing or cannot open your mouth.  You have severe problems (symptoms). MAKE SURE YOU:  Understand these instructions.  Will watch your condition.  Will get help right away if you are not doing well or get worse. Document Released: 09/16/2005 Document Revised: 12/09/2011 Document Reviewed: 01/07/2011 [facility] Patient Information 2014 [place].

## 2014-01-13 NOTE — ED Provider Notes (Signed)
CSN: [id number]     Arrival date & time 01/08/14  1618 History   First MD Initiated Contact with Patient 01/08/14 1712     Chief Complaint  Patient presents with  . Facial Pain     HPI Pt reports facial pain ongoing since having mouth surgery on the 31st. Pt also reports that she has not had her coumadin checked and has been self adjusting her coumadin. Pt tried tylenol at 1000 without relief. Pt denies abnormal bleeding at present.  Past Medical History  Diagnosis Date  . Pulmonary embolism  September 07, 2005     her CT angiogram - positive for pulmonary emboli to several branches of the right lower lobe- relatively small clot burden, clear lung; patient started on Coumadin; CT angiogram on January 10, 2006 showed resolution of previously seen pulmonary emboli with minimal basilar atelectasis  . Lung nodule  June 10, 2008     stable tiny noduke noted along the minor fissure of the right lung on CT angio September 11, 09 -  stable for 2 years and consistent with benign disease  . Arm DVT (deep venous thromboembolism), acute  September 23, 2009, January 05, 2010     Doppler study significant with indeterminant age DVT involving the left upper extremity, Doppler performed January 05, 2010 consistent with acute DVT involving the left upper extremity  . Asthma   . Anemia 2007     microcytic anemia, baseline hemoglobin 10-11, MCV at baseline 72-77, secondary to iron deficiency  . Schizophrenia   . Depression   . Leukopenia 2008     unclear etiology baseline WBC  2.8-3.7   Past Surgical History  Procedure Laterality Date  . Cesarean section       History of 5 C-section  . Tubal ligation    . Exploratory laparotomy with abdominal mass excision  02/2005   Family History  Problem Relation Age of Onset  . Hypertension Mother   . Heart disease Mother   . Diabetes Father   . Birth defects Maternal Aunt   . Birth defects Maternal Uncle   . Diabetes Paternal Grandmother    History  Substance  Use Topics  . Smoking status: Current Every Day Smoker -- 0.30 packs/day    Types: Cigarettes  . Smokeless tobacco: Not on file     Comment: wearing patches  . Alcohol Use: Yes     Comment: occ beer   OB History   Grav Para Term Preterm Abortions TAB SAB Ect Mult Living   6 5 5  0 0 0 0 0 0 5     Review of Systems  All other systems reviewed and are negative  Allergies  Review of patient's allergies indicates no known allergies.  Home Medications   Prior to Admission medications   Medication Sig Start Date End Date Taking? Authorizing Provider  albuterol (PROVENTIL HFA;VENTOLIN HFA) 108 (90 BASE) MCG/ACT inhaler Inhale 2 puffs into the lungs every 6 (six) hours as needed for wheezing. For shortness of breath 02/08/13  Yes Hadassah Pais, MD  ARIPiprazole (ABILIFY) 10 MG tablet Take 1 tablet (10 mg total) by mouth daily. 09/14/13  Yes Otho Bellows, MD  ferrous sulfate 325 (65 FE) MG tablet Take 1 tablet (325 mg total) by mouth 3 (three) times daily with meals. 09/14/13  Yes Otho Bellows, MD  ipratropium (ATROVENT) 0.03 % nasal spray Place 2 sprays into the nose 3 (three) times daily. 11/18/13  Yes Ivin Poot, MD  nicotine (NICODERM CQ - DOSED IN MG/24 HOURS) 14 mg/24hr patch Place 1 patch onto the skin daily.   Yes Historical Provider, MD  Olopatadine HCl (PATADAY) 0.2 % SOLN Apply 1 drop to eye daily. 09/14/13  Yes Otho Bellows, MD  pantoprazole (PROTONIX) 20 MG tablet Take 1 tablet (20 mg total) by mouth daily. 09/14/13 09/14/14 Yes Otho Bellows, MD  warfarin (COUMADIN) 5 MG tablet 15 mg ( 3 tablets )   on Monday Wednesday Friday Sunday. 2. 5 mg on all the rest of the days 1/2 tab of 5 mg 11/15/13  Yes Madilyn Fireman, MD  oxyCODONE-acetaminophen (PERCOCET/ROXICET) 5-325 MG per tablet Take 2 tablets by mouth once. 01/08/14   Dot Lanes, MD   BP 124/78  Pulse 74  Temp(Src) 97.6 F (36.4 C) (Oral)  Resp 14  Ht 5' 4.5" (1.638 m)  Wt 195 lb (88.451 kg)  BMI 32.97  kg/m2  SpO2 99%  LMP 12/04/2013 Physical Exam  Nursing note and vitals reviewed. Constitutional: She is oriented to person, place, and time. She appears well-developed and well-nourished. No distress.  HENT:  Head: Normocephalic and atraumatic.  Mouth/Throat: Uvula is midline and oropharynx is clear and moist. No dental abscesses.    Eyes: Pupils are equal, round, and reactive to light.  Neck: Normal range of motion.  Cardiovascular: Normal rate and intact distal pulses.   Pulmonary/Chest: No respiratory distress.  Abdominal: Normal appearance. She exhibits no distension.  Musculoskeletal: Normal range of motion.  Neurological: She is alert and oriented to person, place, and time. No cranial nerve deficit.  Skin: Skin is warm and dry. No rash noted.  Psychiatric: She has a normal mood and affect. Her behavior is normal.    ED Course  Procedures (including critical care time) Labs Review Labs Reviewed  CBC WITH DIFFERENTIAL - Abnormal; Notable for the following:    Neutrophils Relative % 90 (*)    Neutro Abs 8.1 (*)    Lymphocytes Relative 4 (*)    Lymphs Abs 0.4 (*)    All other components within normal limits  PROTIME-INR - Abnormal; Notable for the following:    Prothrombin Time 24.4 (*)    INR 2.28 (*)    All other components within normal limits   Medications  oxyCODONE-acetaminophen (PERCOCET/ROXICET) 5-325 MG per tablet 2 tablet (2 tablets Oral Given 01/08/14 1729)     Imaging Review No results found.    MDM   Final diagnoses:  Dry tooth socket        Dot Lanes, MD 01/13/14 [phone]

## 2014-03-01 ENCOUNTER — Ambulatory Visit (INDEPENDENT_AMBULATORY_CARE_PROVIDER_SITE_OTHER): Payer: Medicaid Other | Admitting: Internal Medicine

## 2014-03-01 ENCOUNTER — Telehealth: Payer: Self-pay | Admitting: Pharmacist

## 2014-03-01 ENCOUNTER — Encounter: Payer: Self-pay | Admitting: Internal Medicine

## 2014-03-01 VITALS — BP 140/90 | HR 63 | Temp 97.3°F | Ht 65.5 in | Wt 203.2 lb

## 2014-03-01 DIAGNOSIS — F17200 Nicotine dependence, unspecified, uncomplicated: Secondary | ICD-10-CM

## 2014-03-01 DIAGNOSIS — J45909 Unspecified asthma, uncomplicated: Secondary | ICD-10-CM

## 2014-03-01 DIAGNOSIS — Z0000 Encounter for general adult medical examination without abnormal findings: Secondary | ICD-10-CM

## 2014-03-01 DIAGNOSIS — Z7901 Long term (current) use of anticoagulants: Secondary | ICD-10-CM

## 2014-03-01 MED ORDER — ALBUTEROL SULFATE HFA 108 (90 BASE) MCG/ACT IN AERS: 2.0000 | INHALATION_SPRAY | Freq: Four times a day (QID) | RESPIRATORY_TRACT | Status: DC | PRN

## 2014-03-01 NOTE — Assessment & Plan Note (Signed)
Stable. Refill given.

## 2014-03-01 NOTE — Progress Notes (Signed)
Patient ID: Michele Gillespie, female   DOB: 1976-09-30, 38 y.o.   MRN: [id number]   Subjective:   Patient ID: Michele Gillespie female   DOB: 02/04/1976 38 y.o.   MRN: [id number]  HPI: [name] is a 38 y.o. with PMH of Tobacco abuse, depression, asthma, GERD, presented today for pap smear and Mammogram. Pt last pap smear was - 2014, negative for intraepithelial lesion. Pt not due for another pap smear till 2017. Also pt says she will like to call and schedule her mammogram.   No complaints today, wants refill of her Albuterol inhaler.  Past Medical History  Diagnosis Date  . Pulmonary embolism  September 07, 2005     her CT angiogram - positive for pulmonary emboli to several branches of the right lower lobe- relatively small clot burden, clear lung; patient started on Coumadin; CT angiogram on January 10, 2006 showed resolution of previously seen pulmonary emboli with minimal basilar atelectasis  . Lung nodule  June 10, 2008     stable tiny noduke noted along the minor fissure of the right lung on CT angio September 11, 09 -  stable for 2 years and consistent with benign disease  . Arm DVT (deep venous thromboembolism), acute  September 23, 2009, January 05, 2010     Doppler study significant with indeterminant age DVT involving the left upper extremity, Doppler performed January 05, 2010 consistent with acute DVT involving the left upper extremity  . Asthma   . Anemia 2007     microcytic anemia, baseline hemoglobin 10-11, MCV at baseline 72-77, secondary to iron deficiency  . Schizophrenia   . Depression   . Leukopenia 2008     unclear etiology baseline WBC  2.8-3.7   Current Outpatient Prescriptions  Medication Sig Dispense Refill  . albuterol (PROVENTIL HFA;VENTOLIN HFA) 108 (90 BASE) MCG/ACT inhaler Inhale 2 puffs into the lungs every 6 (six) hours as needed for wheezing. For shortness of breath  1 Inhaler  3  . ARIPiprazole (ABILIFY) 10 MG tablet Take 1 tablet (10 mg total) by mouth  daily.  30 tablet  1  . ferrous sulfate 325 (65 FE) MG tablet Take 1 tablet (325 mg total) by mouth 3 (three) times daily with meals.  90 tablet  3  . ipratropium (ATROVENT) 0.03 % nasal spray Place 2 sprays into the nose 3 (three) times daily.  30 mL  0  . nicotine (NICODERM CQ - DOSED IN MG/24 HOURS) 14 mg/24hr patch Place 1 patch onto the skin daily.      . Olopatadine HCl (PATADAY) 0.2 % SOLN Apply 1 drop to eye daily.  1 Bottle  1  . oxyCODONE-acetaminophen (PERCOCET/ROXICET) 5-325 MG per tablet Take 2 tablets by mouth once.  30 tablet  0  . pantoprazole (PROTONIX) 20 MG tablet Take 1 tablet (20 mg total) by mouth daily.  30 tablet  1  . warfarin (COUMADIN) 5 MG tablet 15 mg ( 3 tablets )   on Monday Wednesday Friday Sunday. 2. 5 mg on all the rest of the days 1/2 tab of 5 mg       No current facility-administered medications for this visit.   Family History  Problem Relation Age of Onset  . Hypertension Mother   . Heart disease Mother   . Diabetes Father   . Birth defects Maternal Aunt   . Birth defects Maternal Uncle   . Diabetes Paternal Grandmother    History   Social History  .  Marital Status: Married    Spouse Name: N/A    Number of Children: N/A  . Years of Education: N/A   Social History Main Topics  . Smoking status: Current Every Day Smoker -- 0.30 packs/day    Types: Cigarettes  . Smokeless tobacco: None     Comment: wearing patches  . Alcohol Use: Yes     Comment: occ beer  . Drug Use: Yes    Special: Marijuana  . Sexual Activity: Yes    Birth Control/ Protection: None   Other Topics Concern  . None   Social History Narrative    Works as a Quarry manager, 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, has used cocaine in the past, history of multiple incarcerations last one in November 2011   Review of Systems: CONSTITUTIONAL- No Fever, weightloss, night sweat or change in appetite. SKIN- No Rash, colour changes or itching. HEAD- No Headache or  dizziness. EYES- No Vision loss, pain, redness, double or blurred vision. EARS- No vertigo, hearing loss or ear discharge. Mouth/throat- No Sorethroat. RESPIRATORY- No Cough or SOB. CARDIAC- No Palpitations, DOE, PND or chest pain. GI- No nausea, vomiting, diarrhoea, constipation, abd pain. URINARY- No Frequency, urgency, straining or dysuria. NEUROLOGIC- No Numbness, syncope, seizures or burning. [facility]- Denies homicidal or suicidal ideation.  Objective:  Physical Exam: Filed Vitals:   03/01/14 1629  BP: 140/90  Pulse: 63  Temp: 97.3 F (36.3 C)  TempSrc: Oral  Height: 5' 5.5" (1.664 m)  Weight: 203 lb 3.2 oz (92.171 kg)  SpO2: 100%   GENERAL- alert, co-operative, appears as stated age, not in any distress. HEENT- Atraumatic, normocephalic, PERRL, EOMI, oral mucosa appears moist, neck supple. CARDIAC- RRR, no murmurs, rubs or gallops. RESP- Moving equal volumes of air, and clear to auscultation bilaterally, no wheezes or crackles. ABDOMEN- Soft, nontender, no palpable masses or organomegaly, bowel sounds present. NEURO- No obvious Cr N abnormality, strenght upper and lower extremities- 5/5, Gait- Normal. EXTREMITIES- pulse 2+, symmetric, no pedal edema. SKIN- Warm, dry, No rash or lesion. PSYCH- Normal mood and affect, appropriate thought content and speech.  Assessment & Plan:   The patient's case and plan of care was discussed with attending physician, Dr. Ezzie Dural.  Please see problem based charting for assessment and plan.

## 2014-03-01 NOTE — Patient Instructions (Signed)
General Instructions: You are not due for a PAP smear till 2017. But you are due for a mammogram. Please call like you said you would to schedule this appointment.  Also follow up with for follow up for your depression and so they can prescribe your medication- Abilify.  Also please follow up for your INR check as this is important to prevent more blood clot in your lungs.  It is very important you quit smoking, like we talked about. This will reduce your risk of stroke, heart attacks, and improve your difficulty breathing.    Please bring your medicines with you each time you come to clinic.  Medicines may include prescription medications, over-the-counter medications, herbal remedies, eye drops, vitamins, or other pills.

## 2014-03-01 NOTE — Assessment & Plan Note (Addendum)
Pt presented today, says she was due for a pap smear. Last Pap smear was 2014, no Intraepith lesions seen. Pt also due for mammogram, says her sisters- 2 of them were diagnosed with breast cancer, in their 64s.   Plan- Counselled pt that she was not due for a pap smear, as her last pap was normal. Next due 2017. - Pt says she will call and schedule mammogram herself.

## 2014-03-01 NOTE — Assessment & Plan Note (Signed)
Pt says she has been getting her coumadin, but has not checked her INR since March. Has been self adjusting her coumadin. Occasional SOB with exertion, unchanged from baseline relieved with inhalers. No leg, or arm swelling or pain, no cough.  Plan- INR check today, by Dr. Elie Confer.

## 2014-03-01 NOTE — Assessment & Plan Note (Signed)
Pt says she has been under a lot of stress lately, and so has gone back up to 7 cigs per day.  Plan- Counselled on smoking cessation.

## 2014-03-01 NOTE — Telephone Encounter (Signed)
Called to [facility] to react to INR = 4.3 after patient had been "self-adjusting" her warfarin having been released from our [facility] some time ago--and she was subsequently seen by a provider within the system--but she indicates she would like to return to our clinic and have her warfarin managed here as well see our physicians. She had been taking alternating 3 tablets (5mg ) warfarin with 2.5 tablets (12.5mg ) every other day. Today's dose was OMITTED and she will recommence tomorrow taking 2.5x5mg  (12.5mg  warfarin) po every day EXCEPT on Sundays on which she will take 3x5mg  (15mg ) warfarin. She will RTC in 2 weeks for repeat INR.

## 2014-03-03 NOTE — Progress Notes (Signed)
Case discussed with Dr. Emokpae at the time of the visit.  We reviewed the resident's history and exam and pertinent patient test results.  I agree with the assessment, diagnosis, and plan of care documented in the resident's note. 

## 2014-03-09 ENCOUNTER — Emergency Department (HOSPITAL_COMMUNITY)
Admission: EM | Admit: 2014-03-09 | Discharge: 2014-03-09 | Disposition: A | Discharge status: Home / Self Care | Payer: Medicaid Other | Attending: Emergency Medicine | Admitting: Emergency Medicine

## 2014-03-09 ENCOUNTER — Encounter (HOSPITAL_COMMUNITY): Payer: Self-pay | Admitting: Emergency Medicine

## 2014-03-09 ENCOUNTER — Emergency Department (HOSPITAL_COMMUNITY): Payer: Medicaid Other

## 2014-03-09 DIAGNOSIS — R42 Dizziness and giddiness: Secondary | ICD-10-CM | POA: Insufficient documentation

## 2014-03-09 DIAGNOSIS — R109 Unspecified abdominal pain: Secondary | ICD-10-CM | POA: Insufficient documentation

## 2014-03-09 DIAGNOSIS — Z8659 Personal history of other mental and behavioral disorders: Secondary | ICD-10-CM | POA: Insufficient documentation

## 2014-03-09 DIAGNOSIS — R197 Diarrhea, unspecified: Secondary | ICD-10-CM | POA: Insufficient documentation

## 2014-03-09 DIAGNOSIS — R112 Nausea with vomiting, unspecified: Secondary | ICD-10-CM | POA: Insufficient documentation

## 2014-03-09 DIAGNOSIS — Z7901 Long term (current) use of anticoagulants: Secondary | ICD-10-CM | POA: Insufficient documentation

## 2014-03-09 DIAGNOSIS — J45909 Unspecified asthma, uncomplicated: Secondary | ICD-10-CM | POA: Insufficient documentation

## 2014-03-09 DIAGNOSIS — N289 Disorder of kidney and ureter, unspecified: Secondary | ICD-10-CM | POA: Insufficient documentation

## 2014-03-09 DIAGNOSIS — D649 Anemia, unspecified: Secondary | ICD-10-CM | POA: Insufficient documentation

## 2014-03-09 DIAGNOSIS — Z86718 Personal history of other venous thrombosis and embolism: Secondary | ICD-10-CM | POA: Insufficient documentation

## 2014-03-09 DIAGNOSIS — Z791 Long term (current) use of non-steroidal anti-inflammatories (NSAID): Secondary | ICD-10-CM | POA: Insufficient documentation

## 2014-03-09 DIAGNOSIS — R791 Abnormal coagulation profile: Secondary | ICD-10-CM | POA: Insufficient documentation

## 2014-03-09 DIAGNOSIS — F17200 Nicotine dependence, unspecified, uncomplicated: Secondary | ICD-10-CM | POA: Insufficient documentation

## 2014-03-09 DIAGNOSIS — Z79899 Other long term (current) drug therapy: Secondary | ICD-10-CM | POA: Insufficient documentation

## 2014-03-09 DIAGNOSIS — Z86711 Personal history of pulmonary embolism: Secondary | ICD-10-CM | POA: Insufficient documentation

## 2014-03-09 LAB — URINALYSIS, ROUTINE W REFLEX MICROSCOPIC
BILIRUBIN URINE: NEGATIVE
Glucose, UA: NEGATIVE mg/dL
Ketones, ur: NEGATIVE mg/dL
Leukocytes, UA: NEGATIVE
NITRITE: NEGATIVE
Protein, ur: NEGATIVE mg/dL
Specific Gravity, Urine: >1.03 — ABNORMAL HIGH (ref 1.005–1.030)
UROBILINOGEN UA: 0.2 mg/dL (ref 0.0–1.0)
pH: 6 (ref 5.0–8.0)

## 2014-03-09 LAB — URINE MICROSCOPIC-ADD ON

## 2014-03-09 LAB — CBC WITH DIFFERENTIAL/PLATELET
BASOS PCT: 1 % (ref 0–1)
Basophils Absolute: 0 10*3/uL (ref 0.0–0.1)
Eosinophils Absolute: 0 10*3/uL (ref 0.0–0.7)
Eosinophils Relative: 1 % (ref 0–5)
HEMATOCRIT: 32.7 % — AB (ref 36.0–46.0)
Hemoglobin: 10.3 g/dL — ABNORMAL LOW (ref 12.0–15.0)
LYMPHS PCT: 49 % — AB (ref 12–46)
Lymphs Abs: 1.7 10*3/uL (ref 0.7–4.0)
MCH: 21.5 pg — ABNORMAL LOW (ref 26.0–34.0)
MCHC: 31.5 g/dL (ref 30.0–36.0)
MCV: 68.4 fL — ABNORMAL LOW (ref 78.0–100.0)
MONOS PCT: 15 % — AB (ref 3–12)
Monocytes Absolute: 0.5 10*3/uL (ref 0.1–1.0)
NEUTROS ABS: 1.1 10*3/uL — AB (ref 1.7–7.7)
Neutrophils Relative %: 34 % — ABNORMAL LOW (ref 43–77)
Platelets: 192 10*3/uL (ref 150–400)
RBC: 4.78 MIL/uL (ref 3.87–5.11)
RDW: 18.1 % — ABNORMAL HIGH (ref 11.5–15.5)
WBC: 3.3 10*3/uL — AB (ref 4.0–10.5)

## 2014-03-09 LAB — COMPREHENSIVE METABOLIC PANEL
ALK PHOS: 46 U/L (ref 39–117)
ALT: 15 U/L (ref 0–35)
AST: 19 U/L (ref 0–37)
Albumin: 3.2 g/dL — ABNORMAL LOW (ref 3.5–5.2)
BUN: 14 mg/dL (ref 6–23)
CHLORIDE: 108 meq/L (ref 96–112)
CO2: 23 mEq/L (ref 19–32)
Calcium: 9.4 mg/dL (ref 8.4–10.5)
Creatinine, Ser: 1.22 mg/dL — ABNORMAL HIGH (ref 0.50–1.10)
GFR calc non Af Amer: 55 mL/min — ABNORMAL LOW (ref >90)
GFR, EST AFRICAN AMERICAN: 63 mL/min — AB (ref >90)
GLUCOSE: 107 mg/dL — AB (ref 70–99)
POTASSIUM: 4.5 meq/L (ref 3.7–5.3)
Sodium: 143 mEq/L (ref 137–147)
Total Protein: 7 g/dL (ref 6.0–8.3)

## 2014-03-09 LAB — PROTIME-INR
INR: 1.53 — ABNORMAL HIGH (ref 0.00–1.49)
Prothrombin Time: 18 seconds — ABNORMAL HIGH (ref 11.6–15.2)

## 2014-03-09 LAB — LIPASE, BLOOD: LIPASE: 20 U/L (ref 11–59)

## 2014-03-09 MED ORDER — HYDROMORPHONE HCL PF 1 MG/ML IJ SOLN
1.0000 mg | Freq: Once | INTRAMUSCULAR | Status: AC
  Administered 2014-03-09: 1 mg via INTRAVENOUS
  Filled 2014-03-09: qty 1

## 2014-03-09 MED ORDER — SODIUM CHLORIDE 0.9 % IV BOLUS (SEPSIS)
1000.0000 mL | Freq: Once | INTRAVENOUS | Status: AC
  Administered 2014-03-09: 1000 mL via INTRAVENOUS

## 2014-03-09 MED ORDER — HYDROCODONE-ACETAMINOPHEN 5-325 MG PO TABS
2.0000 | ORAL_TABLET | Freq: Once | ORAL | Status: AC
  Administered 2014-03-09: 2 via ORAL
  Filled 2014-03-09: qty 2

## 2014-03-09 MED ORDER — HYDROCODONE-ACETAMINOPHEN 5-325 MG PO TABS: 2.0000 | ORAL_TABLET | ORAL | Status: DC | PRN

## 2014-03-09 NOTE — ED Notes (Signed)
Pt ED via EMS with c/o abdominal pain associated with nausea/vomiting/diarrhea, onset Sunday. Pt Denies bloody or dark stool. Per EMS, BP-142/90, HR-92.

## 2014-03-09 NOTE — Discharge Instructions (Signed)
If you were given medicines take as directed.  If you are on coumadin or contraceptives realize their levels and effectiveness is altered by many different medicines.  If you have any reaction (rash, tongues swelling, other) to the medicines stop taking and see a physician.   Please follow up as directed and return to the ER or see a physician for new or worsening symptoms.  Thank you. Filed Vitals:   03/09/14 0240 03/09/14 0245 03/09/14 0510  BP: 131/83 121/90 124/84  Pulse: 68 67 64  Temp: 98.2 F (36.8 C)    TempSrc: Oral    Resp: 18  17  SpO2: 99% 100% 100%    Abdominal Pain, Women Abdominal (stomach, pelvic, or belly) pain can be caused by many things. It is important to tell your doctor:  The location of the pain.  Does it come and go or is it present all the time?  Are there things that start the pain (eating certain foods, exercise)?  Are there other symptoms associated with the pain (fever, nausea, vomiting, diarrhea)? All of this is helpful to know when trying to find the cause of the pain. CAUSES   Stomach: virus or bacteria infection, or ulcer.  Intestine: appendicitis (inflamed appendix), regional ileitis (Crohn's disease), ulcerative colitis (inflamed colon), irritable bowel syndrome, diverticulitis (inflamed diverticulum of the colon), or cancer of the stomach or intestine.  Gallbladder disease or stones in the gallbladder.  Kidney disease, kidney stones, or infection.  Pancreas infection or cancer.  Fibromyalgia (pain disorder).  Diseases of the female organs:  Uterus: fibroid (non-cancerous) tumors or infection.  Fallopian tubes: infection or tubal pregnancy.  Ovary: cysts or tumors.  Pelvic adhesions (scar tissue).  Endometriosis (uterus lining tissue growing in the pelvis and on the pelvic organs).  Pelvic congestion syndrome (female organs filling up with blood just before the menstrual period).  Pain with the menstrual period.  Pain with  ovulation (producing an egg).  Pain with an IUD (intrauterine device, birth control) in the uterus.  Cancer of the female organs.  Functional pain (pain not caused by a disease, may improve without treatment).  Psychological pain.  Depression. DIAGNOSIS  Your doctor will decide the seriousness of your pain by doing an examination.  Blood tests.  X-rays.  Ultrasound.  CT scan (computed tomography, special type of X-ray).  MRI (magnetic resonance imaging).  Cultures, for infection.  Barium enema (dye inserted in the large intestine, to better view it with X-rays).  Colonoscopy (looking in intestine with a lighted tube).  Laparoscopy (minor surgery, looking in abdomen with a lighted tube).  Major abdominal exploratory surgery (looking in abdomen with a large incision). TREATMENT  The treatment will depend on the cause of the pain.   Many cases can be observed and treated at home.  Over-the-counter medicines recommended by your caregiver.  Prescription medicine.  Antibiotics, for infection.  Birth control pills, for painful periods or for ovulation pain.  Hormone treatment, for endometriosis.  Nerve blocking injections.  Physical therapy.  Antidepressants.  Counseling with a psychologist or psychiatrist.  Minor or major surgery. HOME CARE INSTRUCTIONS   Do not take laxatives, unless directed by your caregiver.  Take over-the-counter pain medicine only if ordered by your caregiver. Do not take aspirin because it can cause an upset stomach or bleeding.  Try a clear liquid diet (broth or water) as ordered by your caregiver. Slowly move to a bland diet, as tolerated, if the pain is related to the stomach or  intestine.  Have a thermometer and take your temperature several times a day, and record it.  Bed rest and sleep, if it helps the pain.  Avoid sexual intercourse, if it causes pain.  Avoid stressful situations.  Keep your follow-up appointments and  tests, as your caregiver orders.  If the pain does not go away with medicine or surgery, you may try:  Acupuncture.  Relaxation exercises (yoga, meditation).  Group therapy.  Counseling. SEEK MEDICAL CARE IF:   You notice certain foods cause stomach pain.  Your home care treatment is not helping your pain.  You need stronger pain medicine.  You want your IUD removed.  You feel faint or lightheaded.  You develop nausea and vomiting.  You develop a rash.  You are having side effects or an allergy to your medicine. SEEK IMMEDIATE MEDICAL CARE IF:   Your pain does not go away or gets worse.  You have a fever.  Your pain is felt only in portions of the abdomen. The right side could possibly be appendicitis. The left lower portion of the abdomen could be colitis or diverticulitis.  You are passing blood in your stools (bright red or black tarry stools, with or without vomiting).  You have blood in your urine.  You develop chills, with or without a fever.  You pass out. MAKE SURE YOU:   Understand these instructions.  Will watch your condition.  Will get help right away if you are not doing well or get worse. Document Released: 07/14/2007 Document Revised: 12/09/2011 Document Reviewed: 08/03/2009 [facility] Patient Information 2014 [place], [place].

## 2014-03-09 NOTE — ED Notes (Signed)
Pt states she feels dizzy.  Pt vital signs assessed.  Pt was ambulatory with steady gait while getting dressed in the room.  Pt wheeled to the waiting room to wait for the bus.

## 2014-03-12 NOTE — ED Provider Notes (Signed)
CSN: [id number]     Arrival date & time 03/09/14  0235 History   First MD Initiated Contact with Patient 03/09/14 0243     Chief Complaint  Patient presents with  . Abdominal Pain     (Consider location/radiation/quality/duration/timing/severity/associated sxs/prior Treatment) HPI Comments: 38-year-old female history of anemia, blood clots, on Coumadin, smoking presents with nausea, vomiting diarrhea and Sunday and recent lightheadedness with standing. Patient denies bleeding, cramping abdominal pain nonradiating. Worse lower centrally.  Patient is a 38 y.o. female presenting with abdominal pain. The history is provided by the patient.  Abdominal Pain Associated symptoms: diarrhea and vomiting   Associated symptoms: no chest pain, no chills, no dysuria, no fever and no shortness of breath     Past Medical History  Diagnosis Date  . Pulmonary embolism  September 07, 2005     her CT angiogram - positive for pulmonary emboli to several branches of the right lower lobe- relatively small clot burden, clear lung; patient started on Coumadin; CT angiogram on January 10, 2006 showed resolution of previously seen pulmonary emboli with minimal basilar atelectasis  . Lung nodule  June 10, 2008     stable tiny noduke noted along the minor fissure of the right lung on CT angio September 11, 09 -  stable for 2 years and consistent with benign disease  . Arm DVT (deep venous thromboembolism), acute  September 23, 2009, January 05, 2010     Doppler study significant with indeterminant age DVT involving the left upper extremity, Doppler performed January 05, 2010 consistent with acute DVT involving the left upper extremity  . Asthma   . Anemia 2007     microcytic anemia, baseline hemoglobin 10-11, MCV at baseline 72-77, secondary to iron deficiency  . Schizophrenia   . Depression   . Leukopenia 2008     unclear etiology baseline WBC  2.8-3.7   Past Surgical History  Procedure Laterality Date  . Cesarean  section       History of 5 C-section  . Tubal ligation    . Exploratory laparotomy with abdominal mass excision  02/2005   Family History  Problem Relation Age of Onset  . Hypertension Mother   . Heart disease Mother   . Diabetes Father   . Birth defects Maternal Aunt   . Birth defects Maternal Uncle   . Diabetes Paternal Grandmother    History  Substance Use Topics  . Smoking status: Current Every Day Smoker -- 0.30 packs/day    Types: Cigarettes  . Smokeless tobacco: Not on file     Comment: wearing patches  . Alcohol Use: Yes     Comment: occ beer   OB History   Grav Para Term Preterm Abortions TAB SAB Ect Mult Living   6 5 5  0 0 0 0 0 0 5     Review of Systems  Constitutional: Positive for appetite change. Negative for fever and chills.  HENT: Negative for congestion.   Eyes: Negative for visual disturbance.  Respiratory: Negative for shortness of breath.   Cardiovascular: Negative for chest pain.  Gastrointestinal: Positive for vomiting, abdominal pain and diarrhea.  Genitourinary: Negative for dysuria and flank pain.  Musculoskeletal: Negative for back pain, neck pain and neck stiffness.  Skin: Negative for rash.  Neurological: Positive for light-headedness. Negative for headaches.      Allergies  Review of patient's allergies indicates no known allergies.  Home Medications   Prior to Admission medications   Medication Sig Start Date  End Date Taking? Authorizing Provider  albuterol (PROVENTIL HFA;VENTOLIN HFA) 108 (90 BASE) MCG/ACT inhaler Inhale 2 puffs into the lungs every 6 (six) hours as needed for wheezing. For shortness of breath 03/01/14  Yes Ejiroghene Emokpae, MD  flurbiprofen (ANSAID) 100 MG tablet Take 100 mg by mouth 2 (two) times daily.   Yes Historical Provider, MD  HYDROcodone-acetaminophen (NORCO) 10-325 MG per tablet Take 1 tablet by mouth every 6 (six) hours as needed for moderate pain.   Yes Historical Provider, MD  pantoprazole (PROTONIX)  20 MG tablet Take 1 tablet (20 mg total) by mouth daily. 09/14/13 09/14/14 Yes Otho Bellows, MD  penicillin v potassium (VEETID) 500 MG tablet Take 500 mg by mouth 4 (four) times daily.   Yes Historical Provider, MD  warfarin (COUMADIN) 5 MG tablet Take 12.5-15 mg by mouth daily. Take 3 tablets on Sunday.then take 2 and 1/2 tablets all the other days 11/15/13  Yes Madilyn Fireman, MD  HYDROcodone-acetaminophen (NORCO) 5-325 MG per tablet Take 2 tablets by mouth every 4 (four) hours as needed. 03/09/14   Mariea Clonts, MD   BP 145/96  Pulse 59  Temp(Src) 97.4 F (36.3 C) (Oral)  Resp 17  SpO2 100% Physical Exam  Nursing note and vitals reviewed. Constitutional: She is oriented to person, place, and time. She appears well-developed and well-nourished.  HENT:  Head: Normocephalic and atraumatic.  Mild dry mucous membranes  Eyes: Conjunctivae are normal. Right eye exhibits no discharge. Left eye exhibits no discharge.  Neck: Normal range of motion. Neck supple. No tracheal deviation present.  Cardiovascular: Normal rate and regular rhythm.   Pulmonary/Chest: Effort normal and breath sounds normal.  Abdominal: Soft. She exhibits no distension. There is tenderness (mild central and suprapubic). There is no guarding.  Musculoskeletal: She exhibits no edema.  Neurological: She is alert and oriented to person, place, and time.  Skin: Skin is warm. No rash noted.    ED Course  Procedures (including critical care time) Labs Review Labs Reviewed  CBC WITH DIFFERENTIAL - Abnormal; Notable for the following:    WBC 3.3 (*)    Hemoglobin 10.3 (*)    HCT 32.7 (*)    MCV 68.4 (*)    MCH 21.5 (*)    RDW 18.1 (*)    Neutrophils Relative % 34 (*)    Lymphocytes Relative 49 (*)    Monocytes Relative 15 (*)    Neutro Abs 1.1 (*)    All other components within normal limits  COMPREHENSIVE METABOLIC PANEL - Abnormal; Notable for the following:    Glucose, Bld 107 (*)    Creatinine, Ser 1.22  (*)    Albumin 3.2 (*)    Total Bilirubin <0.2 (*)    GFR calc non Af Amer 55 (*)    GFR calc Af Amer 63 (*)    All other components within normal limits  URINALYSIS, ROUTINE W REFLEX MICROSCOPIC - Abnormal; Notable for the following:    Specific Gravity, Urine >1.030 (*)    Hgb urine dipstick TRACE (*)    All other components within normal limits  PROTIME-INR - Abnormal; Notable for the following:    Prothrombin Time 18.0 (*)    INR 1.53 (*)    All other components within normal limits  URINE MICROSCOPIC-ADD ON - Abnormal; Notable for the following:    Squamous Epithelial / LPF FEW (*)    All other components within normal limits  LIPASE, BLOOD    Imaging Review No  results found. US Transvaginal Non-ob  03/09/2014   CLINICAL DATA:  Abdominal pain.  EXAM: TRANSABDOMINAL ULTRASOUND OF PELVIS  TECHNIQUE: Transabdominal ultrasound examination of the pelvis was performed including evaluation of the uterus, ovaries, adnexal regions, and pelvic cul-de-sac.  COMPARISON:  Pelvic ultrasound April 27, 2013  FINDINGS: Uterus  Measurements: 10 x 5 x 6.8 cm. No fibroids or other mass visualized.  Endometrium  Thickness: 7 mm.  No focal abnormality visualized.  Right ovary  Sonographically occult, possibly obscured by bowel gas.  Left ovary  Measurements: 2.5 x 1.7 x 1.8 cm. Normal morphology and echogenicity, and 1.2 cm anechoic presumed follicle versus cyst with increased through transmission, similar. No superimposed abnormal vascularity.  Other findings:  No free fluid  IMPRESSION: No acute pelvic process ; stable appearance of the pelvis.  Sonographically occult right ovary, possibly obscured by bowel gas.   Electronically Signed   By: Elon Alas   On: 03/09/2014 04:54   US Pelvis Complete  03/09/2014   CLINICAL DATA:  Abdominal pain.  EXAM: TRANSABDOMINAL ULTRASOUND OF PELVIS  TECHNIQUE: Transabdominal ultrasound examination of the pelvis was performed including evaluation of the uterus,  ovaries, adnexal regions, and pelvic cul-de-sac.  COMPARISON:  Pelvic ultrasound April 27, 2013  FINDINGS: Uterus  Measurements: 10 x 5 x 6.8 cm. No fibroids or other mass visualized.  Endometrium  Thickness: 7 mm.  No focal abnormality visualized.  Right ovary  Sonographically occult, possibly obscured by bowel gas.  Left ovary  Measurements: 2.5 x 1.7 x 1.8 cm. Normal morphology and echogenicity, and 1.2 cm anechoic presumed follicle versus cyst with increased through transmission, similar. No superimposed abnormal vascularity.  Other findings:  No free fluid  IMPRESSION: No acute pelvic process ; stable appearance of the pelvis.  Sonographically occult right ovary, possibly obscured by bowel gas.   Electronically Signed   By: Elon Alas   On: 03/09/2014 04:54   EKG Interpretation None      MDM   Final diagnoses:  Abdominal pain  Sub therapeutic inr Anemia  Overall well appearing female with clinically gastroenteritis however worsening pain suprapubic. Bloodwork reviewed showing mild anemia, mild renal insufficiency and INR mild subtherapeutic. Discuss close followup for repeat INR check and she may need adjusting her dose. Patient feels significantly improved and recheck, ultrasound unremarkable abdominal pain resolved. Results and differential diagnosis were discussed with the patient/parent/guardian. Close follow up outpatient was discussed, comfortable with the plan.   Medications  sodium chloride 0.9 % bolus 1,000 mL (0 mLs Intravenous Stopped 03/09/14 0600)  HYDROmorphone (DILAUDID) injection 1 mg (1 mg Intravenous Given 03/09/14 0504)  HYDROcodone-acetaminophen (NORCO/VICODIN) 5-325 MG per tablet 2 tablet (2 tablets Oral Given 03/09/14 0557)    Filed Vitals:   03/09/14 0245 03/09/14 0510 03/09/14 0545 03/09/14 0600  BP: 121/90 124/84 132/88 145/96  Pulse: 67 64 59 59  Temp:    97.4 F (36.3 C)  TempSrc:      Resp:  17  17  SpO2: 100% 100% 100% 100%        Mariea Clonts, MD 03/14/14 [phone]

## 2014-03-14 ENCOUNTER — Ambulatory Visit (INDEPENDENT_AMBULATORY_CARE_PROVIDER_SITE_OTHER): Payer: Medicaid Other | Admitting: Pharmacist

## 2014-03-14 DIAGNOSIS — Z7901 Long term (current) use of anticoagulants: Secondary | ICD-10-CM

## 2014-03-14 DIAGNOSIS — D6859 Other primary thrombophilia: Secondary | ICD-10-CM

## 2014-03-14 LAB — POCT INR: INR: 2.3

## 2014-03-14 MED ORDER — WARFARIN SODIUM 5 MG PO TABS: ORAL_TABLET | ORAL | Status: DC

## 2014-03-14 NOTE — Patient Instructions (Signed)
Patient instructed to take medications as defined in the Anti-coagulation Track section of this encounter.  Patient instructed to take today's dose.  Patient verbalized understanding of these instructions.    

## 2014-03-14 NOTE — Progress Notes (Signed)
Anti-Coagulation Progress Note  Michele Gillespie is a 38 y.o. female who is currently on an anti-coagulation regimen.    RECENT RESULTS: Recent results are below, the most recent result is correlated with a dose of 90 mg. per week: Lab Results  Component Value Date   INR 2.3 03/14/2014   INR 1.53* 03/09/2014   INR 2.28* 01/08/2014    ANTI-COAG DOSE: Anticoagulation Dose Instructions as of 03/14/2014     [name] Tue Wed Thu Fri Sat   New Dose 15 mg 12.5 mg 12.5 mg 12.5 mg 12.5 mg 12.5 mg 12.5 mg       ANTICOAG SUMMARY: Anticoagulation Episode Summary   Current INR goal 2.0-3.0  Next INR check 04/11/2014  INR from last check 2.3 (03/14/2014)  Weekly max dose   Target end date Indefinite  [facility]  Preferred lab   Send INR reminders to    Indications  HYPERCOAGULABLE STATE PRIMARY [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.81] Long term current use of anticoagulant [V58.61]        Comments         ANTICOAG TODAY: Anticoagulation Summary as of 03/14/2014   INR goal 2.0-3.0  Selected INR 2.3 (03/14/2014)  Next INR check 04/11/2014  Target end date Indefinite   Indications  HYPERCOAGULABLE STATE PRIMARY [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  Patient instructed to take today's dose.  Patient verbalized understanding of these instructions.       FOLLOW-UP Return in 4 weeks (on 04/11/2014) for Follow up INR at 1045h.  [name], III Pharm.D., CACP

## 2014-03-15 ENCOUNTER — Emergency Department (HOSPITAL_COMMUNITY)
Admission: EM | Admit: 2014-03-15 | Discharge: 2014-03-15 | Disposition: A | Discharge status: Home / Self Care | Payer: Medicaid Other | Attending: Emergency Medicine | Admitting: Emergency Medicine

## 2014-03-15 ENCOUNTER — Emergency Department (HOSPITAL_COMMUNITY): Payer: Medicaid Other

## 2014-03-15 ENCOUNTER — Encounter (HOSPITAL_COMMUNITY): Payer: Self-pay | Admitting: Emergency Medicine

## 2014-03-15 DIAGNOSIS — N9089 Other specified noninflammatory disorders of vulva and perineum: Secondary | ICD-10-CM | POA: Insufficient documentation

## 2014-03-15 DIAGNOSIS — Y9389 Activity, other specified: Secondary | ICD-10-CM | POA: Insufficient documentation

## 2014-03-15 DIAGNOSIS — Z86718 Personal history of other venous thrombosis and embolism: Secondary | ICD-10-CM | POA: Insufficient documentation

## 2014-03-15 DIAGNOSIS — J45909 Unspecified asthma, uncomplicated: Secondary | ICD-10-CM | POA: Insufficient documentation

## 2014-03-15 DIAGNOSIS — Z862 Personal history of diseases of the blood and blood-forming organs and certain disorders involving the immune mechanism: Secondary | ICD-10-CM | POA: Insufficient documentation

## 2014-03-15 DIAGNOSIS — R300 Dysuria: Secondary | ICD-10-CM | POA: Insufficient documentation

## 2014-03-15 DIAGNOSIS — N9489 Other specified conditions associated with female genital organs and menstrual cycle: Secondary | ICD-10-CM | POA: Insufficient documentation

## 2014-03-15 DIAGNOSIS — Z79899 Other long term (current) drug therapy: Secondary | ICD-10-CM | POA: Insufficient documentation

## 2014-03-15 DIAGNOSIS — N898 Other specified noninflammatory disorders of vagina: Secondary | ICD-10-CM | POA: Insufficient documentation

## 2014-03-15 DIAGNOSIS — W010XXA Fall on same level from slipping, tripping and stumbling without subsequent striking against object, initial encounter: Secondary | ICD-10-CM | POA: Insufficient documentation

## 2014-03-15 DIAGNOSIS — Y929 Unspecified place or not applicable: Secondary | ICD-10-CM | POA: Insufficient documentation

## 2014-03-15 DIAGNOSIS — F17200 Nicotine dependence, unspecified, uncomplicated: Secondary | ICD-10-CM | POA: Insufficient documentation

## 2014-03-15 DIAGNOSIS — Z8659 Personal history of other mental and behavioral disorders: Secondary | ICD-10-CM | POA: Insufficient documentation

## 2014-03-15 DIAGNOSIS — Z7901 Long term (current) use of anticoagulants: Secondary | ICD-10-CM | POA: Insufficient documentation

## 2014-03-15 DIAGNOSIS — M25512 Pain in left shoulder: Secondary | ICD-10-CM

## 2014-03-15 DIAGNOSIS — S46909A Unspecified injury of unspecified muscle, fascia and tendon at shoulder and upper arm level, unspecified arm, initial encounter: Secondary | ICD-10-CM | POA: Insufficient documentation

## 2014-03-15 DIAGNOSIS — Z792 Long term (current) use of antibiotics: Secondary | ICD-10-CM | POA: Insufficient documentation

## 2014-03-15 DIAGNOSIS — Z3202 Encounter for pregnancy test, result negative: Secondary | ICD-10-CM | POA: Insufficient documentation

## 2014-03-15 DIAGNOSIS — Z86711 Personal history of pulmonary embolism: Secondary | ICD-10-CM | POA: Insufficient documentation

## 2014-03-15 DIAGNOSIS — S4980XA Other specified injuries of shoulder and upper arm, unspecified arm, initial encounter: Secondary | ICD-10-CM | POA: Insufficient documentation

## 2014-03-15 LAB — HIV ANTIBODY (ROUTINE TESTING W REFLEX): HIV 1&2 Ab, 4th Generation: NONREACTIVE

## 2014-03-15 LAB — URINALYSIS, ROUTINE W REFLEX MICROSCOPIC
Bilirubin Urine: NEGATIVE
Glucose, UA: NEGATIVE mg/dL
Ketones, ur: NEGATIVE mg/dL
NITRITE: NEGATIVE
PH: 5.5 (ref 5.0–8.0)
Protein, ur: NEGATIVE mg/dL
Specific Gravity, Urine: 1.02 (ref 1.005–1.030)
Urobilinogen, UA: 0.2 mg/dL (ref 0.0–1.0)

## 2014-03-15 LAB — URINE MICROSCOPIC-ADD ON

## 2014-03-15 LAB — WET PREP, GENITAL
Clue Cells Wet Prep HPF POC: NONE SEEN
Trich, Wet Prep: NONE SEEN
Yeast Wet Prep HPF POC: NONE SEEN

## 2014-03-15 LAB — POC URINE PREG, ED: Preg Test, Ur: NEGATIVE

## 2014-03-15 LAB — RPR

## 2014-03-15 NOTE — ED Provider Notes (Signed)
CSN: [id number]     Arrival date & time 03/15/14  3716 History   First MD Initiated Contact with Patient 03/15/14 [phone]     Chief Complaint  Patient presents with  . Shoulder Pain  . Vaginal Itching     (Consider location/radiation/quality/duration/timing/severity/associated sxs/prior Treatment) HPI Pt is a 38yo female with hx of DVT of left upper extremity in 2011 currently taking coumadin, presenting to ED c/o left shoulder pain as well as vaginal itching.  Pt states she slipped on oil getting into a car, landing with her arm stretched out behind her about 5-6 weeks ago.  Pt states since then she has had decreased ROM with left arm, preventing her from performing daily routines easily.  Pain is minimal to non-existent at rest but is 8/10, aching and sharp with certain movements and palpation. Reports taking hydrocodone for her teeth pain and states that does help with her shoulder as well.  Pt is right hand dominant.  Denies previous medical evaluation of shoulder as last time she was in ED she was ready to go and did not want to wait for an x-ray.  Denies numbness or tingling to left arm. Denies previous injury to left arm.  Pt also c/o vaginal itching and white discharge x5 days, pt states she was evaluated in ED on 6/10 for abdominal pain, n/v/d. During that time, she had a vaginal ultrasound and believes she may be allergic to the latex used. Pt denies previous known allergies. Pt states since then, she has had itching and irritation and burning on urination.  Reports taking OTC Monistat and another medication she was prescribed previously for yeast infections but no relief. Denies fever, n/v/d. Pt is sexually active.   Past Medical History  Diagnosis Date  . Pulmonary embolism  September 07, 2005     her CT angiogram - positive for pulmonary emboli to several branches of the right lower lobe- relatively small clot burden, clear lung; patient started on Coumadin; CT angiogram on January 10, 2006 showed resolution of previously seen pulmonary emboli with minimal basilar atelectasis  . Lung nodule  June 10, 2008     stable tiny noduke noted along the minor fissure of the right lung on CT angio September 11, 09 -  stable for 2 years and consistent with benign disease  . Arm DVT (deep venous thromboembolism), acute  September 23, 2009, January 05, 2010     Doppler study significant with indeterminant age DVT involving the left upper extremity, Doppler performed January 05, 2010 consistent with acute DVT involving the left upper extremity  . Asthma   . Anemia 2007     microcytic anemia, baseline hemoglobin 10-11, MCV at baseline 72-77, secondary to iron deficiency  . Schizophrenia   . Depression   . Leukopenia 2008     unclear etiology baseline WBC  2.8-3.7   Past Surgical History  Procedure Laterality Date  . Cesarean section       History of 5 C-section  . Tubal ligation    . Exploratory laparotomy with abdominal mass excision  02/2005   Family History  Problem Relation Age of Onset  . Hypertension Mother   . Heart disease Mother   . Diabetes Father   . Birth defects Maternal Aunt   . Birth defects Maternal Uncle   . Diabetes Paternal Grandmother    History  Substance Use Topics  . Smoking status: Current Every Day Smoker -- 0.30 packs/day    Types: Cigarettes  .  Smokeless tobacco: Not on file     Comment: wearing patches  . Alcohol Use: Yes     Comment: occ beer   OB History   Grav Para Term Preterm Abortions TAB SAB Ect Mult Living   6 5 5  0 0 0 0 0 0 5     Review of Systems  Constitutional: Negative for fever and chills.  Respiratory: Negative for cough and shortness of breath.   Cardiovascular: Negative for chest pain and palpitations.  Gastrointestinal: Negative for nausea, vomiting, abdominal pain and diarrhea.  Genitourinary: Positive for dysuria, vaginal bleeding ( from scatching), vaginal discharge ( white) and genital sores ( "from scratching").  Negative for urgency, hematuria, flank pain, decreased urine volume, vaginal pain and pelvic pain.  Musculoskeletal: Positive for arthralgias and myalgias. Negative for gait problem, joint swelling, neck pain and neck stiffness.        Left shoulder pain  All other systems reviewed and are negative.     Allergies  Review of patient's allergies indicates no known allergies.  Home Medications   Prior to Admission medications   Medication Sig Start Date End Date Taking? Authorizing Galit Urich  albuterol (PROVENTIL HFA;VENTOLIN HFA) 108 (90 BASE) MCG/ACT inhaler Inhale 2 puffs into the lungs every 6 (six) hours as needed for wheezing. For shortness of breath 03/01/14  Yes Ejiroghene Emokpae, MD  flurbiprofen (ANSAID) 100 MG tablet Take 100 mg by mouth 2 (two) times daily.   Yes Historical Romayne Ticas, MD  HYDROcodone-acetaminophen (NORCO) 5-325 MG per tablet Take 2 tablets by mouth every 4 (four) hours as needed. 03/09/14  Yes Mariea Clonts, MD  pantoprazole (PROTONIX) 20 MG tablet Take 1 tablet (20 mg total) by mouth daily. 09/14/13 09/14/14 Yes Otho Bellows, MD  penicillin v potassium (VEETID) 500 MG tablet Take 500 mg by mouth 4 (four) times daily.   Yes Historical Jalon Blackwelder, MD  warfarin (COUMADIN) 5 MG tablet Take 3 tablets on Sunday.then take 2 and 1/2 tablets all the other days 03/14/14  Yes Axel Filler, MD   BP 138/96  Pulse 74  Temp(Src) 98.1 F (36.7 C) (Oral)  Resp 20  SpO2 100%  LMP 03/07/2014 Physical Exam  Nursing note and vitals reviewed. Constitutional: She appears well-developed and well-nourished. No distress.  HENT:  Head: Normocephalic and atraumatic.  Eyes: Conjunctivae are normal. No scleral icterus.  Neck: Normal range of motion.  Cardiovascular: Normal rate, regular rhythm and normal heart sounds.   Pulses:      Radial pulses are 2+ on the left side.  Pulmonary/Chest: Effort normal and breath sounds normal. No respiratory distress. She has no wheezes. She  has no rales. She exhibits no tenderness.  Abdominal: Soft. Bowel sounds are normal. She exhibits no distension and no mass. There is no tenderness. There is no rebound and no guarding.  Genitourinary: No labial fusion. There is no rash, tenderness, lesion or injury on the right labia. There is no rash, tenderness, lesion or injury on the left labia. Cervix exhibits discharge. Cervix exhibits no motion tenderness and no friability. Right adnexum displays no mass, no tenderness and no fullness. Left adnexum displays no mass, no tenderness and no fullness. There is erythema around the vagina. No tenderness or bleeding around the vagina. No signs of injury around the vagina. Vaginal discharge found.  Chaperoned exam. Vaginal canal-erythematous, white-clear discharge present and coming from cervical os.  No CMT, adnexal tenderness or masses.   Musculoskeletal: She exhibits tenderness. She exhibits no edema.  Left shoulder: no deformity. Tenderness along deltoid. No AC joint tenderness. Active abduction left arm limited to just under 90 degrees due to pain. Full passive ROM. FROM left elbow. 5/5 grip strength bilaterally.  Neurological: She is alert.  Skin: Skin is warm and dry. She is not diaphoretic. No erythema.  Left shoulder and arm: Skin in tact. No ecchymosis, erythema, or warmth. No red streaking, induration, or evidence of underlying infection.     ED Course  Procedures (including critical care time) Labs Review Labs Reviewed  WET PREP, GENITAL - Abnormal; Notable for the following:    WBC, Wet Prep HPF POC FEW (*)    All other components within normal limits  URINALYSIS, ROUTINE W REFLEX MICROSCOPIC - Abnormal; Notable for the following:    APPearance CLOUDY (*)    Hgb urine dipstick SMALL (*)    Leukocytes, UA MODERATE (*)    All other components within normal limits  URINE MICROSCOPIC-ADD ON - Abnormal; Notable for the following:    Squamous Epithelial / LPF MANY (*)    All other  components within normal limits  GC/CHLAMYDIA PROBE AMP  RPR  HIV ANTIBODY (ROUTINE TESTING)  POC URINE PREG, ED    Imaging Review Dg Shoulder Left  03/15/2014   CLINICAL DATA:  Left shoulder pain after falling last month. Limited range of motion.  EXAM: LEFT SHOULDER - 2+ VIEW  COMPARISON:  None.  FINDINGS: The mineralization and alignment are normal. There is no evidence of acute fracture or dislocation. The subacromial space is preserved.  IMPRESSION: No acute osseous findings or significant arthropathic changes.   Electronically Signed   By: Camie Patience M.D.   On: 03/15/2014 11:07     EKG Interpretation None      MDM   Final diagnoses:  Left shoulder pain  Vaginal itching    Pt is a 37yo female c/o left shoulder pain and decreased ROM due to slip and fall 6 weeks ago. Left arm is neurovascularly in tact. No obvious deformity. Plain films: unremarkable. Symptoms likely due to soft tissue injury. Advised to f/u with Dr. Lorin Mercy, orthopedics for further evaluation and treatment.  Pt also c/o vaginal itching and irritation. Reports using monistat at home and another yeast infection medication she cannot recall the name of. Reports having vaginal U/S last week for GI symptoms and vaginal symptoms started the next day. Pelvic exam: vaginal erythema with white-clear discharge. No CMT, adnexal tenderness or masses UA: unremarkable. Urine preg: negative. Wet prep: unremarkable.    Discussed results with pt. Do not believe emergent process taking place at this time. Advised to f[facility] for recheck of symptoms.  Return precautions provided. Pt verbalized understanding and agreement with tx plan.     Noland Fordyce, PA-C 03/15/14 1159

## 2014-03-15 NOTE — ED Notes (Signed)
Pt presents to department for evaluation of vaginal itching and L shoulder pain. Ongoing for several days. Pt is alert and oriented x4. NAD.

## 2014-03-15 NOTE — ED Notes (Signed)
PA at bedside.

## 2014-03-16 LAB — GC/CHLAMYDIA PROBE AMP
CT Probe RNA: NEGATIVE
GC Probe RNA: NEGATIVE

## 2014-03-16 NOTE — ED Provider Notes (Signed)
Medical screening examination/treatment/procedure(s) were performed by non-physician practitioner and as supervising physician I was immediately available for consultation/collaboration.   EKG Interpretation None        Sharyon Cable, MD 03/16/14 2024

## 2014-04-11 ENCOUNTER — Ambulatory Visit (INDEPENDENT_AMBULATORY_CARE_PROVIDER_SITE_OTHER): Payer: Medicaid Other | Admitting: Pharmacist

## 2014-04-11 DIAGNOSIS — D6859 Other primary thrombophilia: Secondary | ICD-10-CM

## 2014-04-11 DIAGNOSIS — Z7901 Long term (current) use of anticoagulants: Secondary | ICD-10-CM

## 2014-04-11 LAB — POCT INR: INR: 2.3

## 2014-04-11 MED ORDER — WARFARIN SODIUM 5 MG PO TABS: ORAL_TABLET | ORAL | Status: DC

## 2014-04-11 NOTE — Patient Instructions (Addendum)
Patient instructed to take medications as defined in the Anti-coagulation Track section of this encounter.  Patient instructed to take today's dose.  Patient verbalized understanding of these instructions.    

## 2014-04-11 NOTE — Progress Notes (Signed)
Anti-Coagulation Progress Note  Michele Gillespie is a 38 y.o. female who is currently on an anti-coagulation regimen.    RECENT RESULTS: Recent results are below, the most recent result is correlated with a dose of 90 mg. per week: Lab Results  Component Value Date   INR 2.30 04/11/2014   INR 2.3 03/14/2014   INR 1.53* 03/09/2014    ANTI-COAG DOSE: Anticoagulation Dose Instructions as of 04/11/2014     [name] Tue Wed Thu Fri Sat   New Dose 15 mg 12.5 mg 12.5 mg 12.5 mg 12.5 mg 12.5 mg 12.5 mg       ANTICOAG SUMMARY: Anticoagulation Episode Summary   Current INR goal 2.0-3.0  Next INR check 05/23/2014  INR from last check 2.30 (04/11/2014)  Weekly max dose   Target end date Indefinite  [facility]  Preferred lab   Send INR reminders to    Indications  HYPERCOAGULABLE STATE PRIMARY [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.81] Long term current use of anticoagulant [V58.61]        Comments         ANTICOAG TODAY: Anticoagulation Summary as of 04/11/2014   INR goal 2.0-3.0  Selected INR 2.30 (04/11/2014)  Next INR check 05/23/2014  Target end date Indefinite   Indications  HYPERCOAGULABLE STATE PRIMARY [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  Patient instructed to take today's dose.  Patient verbalized understanding of these instructions.       FOLLOW-UP Return in 6 weeks (on 05/23/2014) for Follow up INR at 1115h.  [name], III Pharm.D., CACP

## 2014-04-29 ENCOUNTER — Encounter: Payer: Self-pay | Admitting: Internal Medicine

## 2014-04-29 ENCOUNTER — Ambulatory Visit (INDEPENDENT_AMBULATORY_CARE_PROVIDER_SITE_OTHER): Payer: Medicaid Other | Admitting: Internal Medicine

## 2014-04-29 VITALS — BP 147/95 | HR 60 | Temp 98.3°F | Wt 198.0 lb

## 2014-04-29 DIAGNOSIS — M272 Inflammatory conditions of jaws: Secondary | ICD-10-CM

## 2014-04-29 DIAGNOSIS — R197 Diarrhea, unspecified: Secondary | ICD-10-CM

## 2014-04-29 LAB — BASIC METABOLIC PANEL WITH GFR
BUN: 12 mg/dL (ref 6–23)
CHLORIDE: 106 meq/L (ref 96–112)
CO2: 24 meq/L (ref 19–32)
CREATININE: 0.85 mg/dL (ref 0.50–1.10)
Calcium: 8.9 mg/dL (ref 8.4–10.5)
GFR, EST NON AFRICAN AMERICAN: 86 mL/min
GFR, Est African American: >89 mL/min
Glucose, Bld: 71 mg/dL (ref 70–99)
Potassium: 4.1 mEq/L (ref 3.5–5.3)
Sodium: 137 mEq/L (ref 135–145)

## 2014-04-29 LAB — C-REACTIVE PROTEIN: CRP: <0.5 mg/dL (ref <0.60)

## 2014-04-29 MED ORDER — AMOXICILLIN-POT CLAVULANATE 875-125 MG PO TABS: 1.0000 | ORAL_TABLET | Freq: Two times a day (BID) | ORAL | Status: DC

## 2014-04-29 NOTE — Progress Notes (Signed)
[facility]: dental referral for jaw osteo Subjective:    Patient ID: Michele Gillespie, female    DOB: 03/26/1976, 38 y.o.   MRN: [id number]  HPI 38 yo F with HTN, and poor dention. Over the last few months she has been seen by her dentist as well as Dr. Romie Minus for management of dental caries, abscess tooth, with possibility of having jaw osteomyelitis. She has required having multipe dental procedures and extraction. She is referred to the ID clinic by Dr. Romie Minus. She has been on several course of oral antibiotics initially penicillin, then switched to clindamycin. During this time she has had intermittent response to therapy with gum, jaw and facial swelling improved. One of her cultures did grow strep species. Dr. Romie Minus has been seeing her routinely every few weeks to see how she is healing. 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 albuterol (PROVENTIL HFA;VENTOLIN HFA) 108 (90 BASE) MCG/ACT inhaler Inhale 2 puffs into the lungs every 6 (six) hours as needed for wheezing. For shortness of breath  1 Inhaler  3  . flurbiprofen (ANSAID) 100 MG tablet Take 100 mg by mouth 2 (two) times daily.      Marland Kitchen HYDROcodone-acetaminophen (NORCO) 5-325 MG per tablet Take 2 tablets by mouth every 4 (four) hours as needed.  6 tablet  0  . pantoprazole (PROTONIX) 20 MG tablet Take 1 tablet (20 mg total) by mouth daily.  30 tablet  1  . warfarin (COUMADIN) 5 MG tablet Take 3 tablets on Sunday.then take 2 and 1/2 tablets all the other days  75 tablet  2  . penicillin v potassium (VEETID) 500 MG tablet Take 500 mg by mouth 4 (four) times daily.       No current facility-administered medications on file prior to visit.   Active Ambulatory Problems    Diagnosis Date Noted  . ANEMIA-IRON DEFICIENCY 10/12/2006  . LEUKOCYTOPENIA NOS 10/12/2006  .  HYPERCOAGULABLE STATE, PRIMARY 10/12/2006  . ASTHMA 10/12/2006  . DEPRESSION, HX OF 05/16/2010  . Long term current use of anticoagulant 10/20/2010  . Tobacco use disorder 06/22/2012  . H/O cesarean section 12/21/2012  . Cervical mass 02/08/2013  . Vaginal discharge 03/06/2013  . Vaginal itching 04/27/2013  . GERD (gastroesophageal reflux disease) 09/14/2013  . Breast mass, right 09/14/2013  . Healthcare maintenance 09/14/2013  . Viral URI with cough 11/18/2013   Resolved Ambulatory Problems    Diagnosis Date Noted  . CONSTIPATION 05/16/2010  . Headache 08/30/2010  . GROIN PAIN 07/04/2009  . Respiratory arrest 05/16/2010  . Personal history of venous thrombosis and embolism 07/21/2006  . Pregnancy test positive 11/27/2010  . Pulmonary embolism and infarction 12/17/2010  . Deep venous thrombosis of leg 12/17/2010  . Headache 05/18/2012  . Screening for STD (sexually transmitted disease) 05/18/2012  . Vaginal itching 06/11/2012  . UTI (lower urinary tract infection) 10/12/2012  . URI (upper respiratory infection) 10/13/2012  . Vaginal discharge 12/01/2012  . Proteinuria 04/27/2013  . Hypokalemia 09/14/2013   Past Medical History  Diagnosis Date  . Pulmonary embolism  September 07, 2005  . Lung nodule  June 10, 2008  . Arm DVT (deep venous thromboembolism), acute  September 23, 2009, January 05, 2010  . Asthma   . Anemia 2007  . Schizophrenia   . Depression   . Leukopenia 2008  Review of Systems + tooth pain, no fever, chills, nightsweats. No weight loss, no diarrhea or rash from antibiotics    Objective:   Physical Exam BP 147/95  Pulse 60  Temp(Src) 98.3 F (36.8 C) (Oral)  Wt 198 lb (89.812 kg)  LMP 04/04/2014 Physical Exam  Constitutional:  oriented to person, place, and time. appears well-developed and well-nourished. No distress.  HENT:  Mouth/Throat: Oropharynx is clear and moist. No oropharyngeal exudate. Mild gum line swelling from right lower jaw  from extraction. Poor dentition Cardiovascular: Normal rate, regular rhythm and normal heart sounds. Exam reveals no gallop and no friction rub.  No murmur heard.  Pulmonary/Chest: Effort normal and breath sounds normal. No respiratory distress.  has no wheezes.  Abdominal: Soft. Bowel sounds are normal.  exhibits no distension. There is no tenderness.  Lymphadenopathy: no cervical adenopathy.  Neurological: alert and oriented to person, place, and time.  Skin: Skin is warm and dry. No rash noted. No erythema.  Psychiatric: a normal mood and affect. behavior is normal.   Lab Results  Component Value Date   ESRSEDRATE 12 04/29/2014   Lab Results  Component Value Date   CRP <0.5 04/29/2014   Lab Results  Component Value Date   WBC 2.2* 04/29/2014   HGB 11.2* 04/29/2014   HCT 36.1 04/29/2014   MCV 68.4* 04/29/2014   PLT 196 04/29/2014        Assessment & Plan:  peridontal infection possible= blood work of cbc with diff, crp and sed rate. Will change to amox/clav. D/c clindamycin to minimize risk of cdifficile. We will prescribe for 2 more weeks. Based upon discussion with Dr. Romie Minus appears patient is improving clinical as well as from recent labs and dental tissue cultures. Will not need further imaging or IV antibiotics at this time.  Health maintenance = recommend HIV testing   Addendum= spoke with Dr. Romie Minus who continued her antibiotics thru late august who felt she is healing better. Bone cultures from latest dental procedure no growth todate. Area looks much improved.

## 2014-04-30 LAB — CBC WITH DIFFERENTIAL/PLATELET
BASOS PCT: 0 % (ref 0–1)
Basophils Absolute: 0 10*3/uL (ref 0.0–0.1)
Eosinophils Absolute: 0 10*3/uL (ref 0.0–0.7)
Eosinophils Relative: 1 % (ref 0–5)
HCT: 36.1 % (ref 36.0–46.0)
HEMOGLOBIN: 11.2 g/dL — AB (ref 12.0–15.0)
Lymphocytes Relative: 57 % — ABNORMAL HIGH (ref 12–46)
Lymphs Abs: 1.3 10*3/uL (ref 0.7–4.0)
MCH: 21.2 pg — ABNORMAL LOW (ref 26.0–34.0)
MCHC: 31 g/dL (ref 30.0–36.0)
MCV: 68.4 fL — ABNORMAL LOW (ref 78.0–100.0)
MONOS PCT: 10 % (ref 3–12)
Monocytes Absolute: 0.2 10*3/uL (ref 0.1–1.0)
Neutro Abs: 0.7 10*3/uL — ABNORMAL LOW (ref 1.7–7.7)
Neutrophils Relative %: 32 % — ABNORMAL LOW (ref 43–77)
Platelets: 196 10*3/uL (ref 150–400)
RBC: 5.28 MIL/uL — ABNORMAL HIGH (ref 3.87–5.11)
RDW: 19.6 % — ABNORMAL HIGH (ref 11.5–15.5)
WBC: 2.2 10*3/uL — ABNORMAL LOW (ref 4.0–10.5)

## 2014-04-30 LAB — SEDIMENTATION RATE: SED RATE: 12 mm/h (ref 0–22)

## 2014-05-02 ENCOUNTER — Other Ambulatory Visit: Payer: Medicaid Other

## 2014-05-02 DIAGNOSIS — R197 Diarrhea, unspecified: Secondary | ICD-10-CM

## 2014-05-02 LAB — PATHOLOGIST SMEAR REVIEW

## 2014-05-03 LAB — CLOSTRIDIUM DIFFICILE BY PCR: Toxigenic C. Difficile by PCR: NOT DETECTED

## 2014-05-06 ENCOUNTER — Telehealth: Payer: Self-pay | Admitting: *Deleted

## 2014-05-06 NOTE — Telephone Encounter (Signed)
Patient called for results of stool sample. Notified no c-diff detected. Patient also called for results of mouth biopsy performed by another provider. Advised patient to call that MD for the results of biopsy.

## 2014-05-09 ENCOUNTER — Ambulatory Visit (INDEPENDENT_AMBULATORY_CARE_PROVIDER_SITE_OTHER): Payer: Medicaid Other | Admitting: Pharmacist

## 2014-05-09 DIAGNOSIS — D6859 Other primary thrombophilia: Secondary | ICD-10-CM

## 2014-05-09 DIAGNOSIS — Z7901 Long term (current) use of anticoagulants: Secondary | ICD-10-CM

## 2014-05-09 LAB — POCT INR: INR: 1.8

## 2014-05-09 NOTE — Patient Instructions (Signed)
Patient instructed to take medications as defined in the Anti-coagulation Track section of this encounter.  Patient instructed to take today's dose.  Patient verbalized understanding of these instructions.    

## 2014-05-09 NOTE — Progress Notes (Signed)
Anti-Coagulation Progress Note  Michele Gillespie is a 38 y.o. female who is currently on an anti-coagulation regimen.    RECENT RESULTS: Recent results are below, the most recent result is correlated with a dose of 90 mg. per week--BUT with interruption around a surgical procedure she states was performed 4-AUG-15. She recommenced warfarin the following day. She has only been back on warfarin for 6 days. Lab Results  Component Value Date   INR 1.80 05/09/2014   INR 2.30 04/11/2014   INR 2.3 03/14/2014    ANTI-COAG DOSE: Anticoagulation Dose Instructions as of 05/09/2014     [name] Tue Wed Thu Fri Sat   New Dose 15 mg 12.5 mg 12.5 mg 12.5 mg 12.5 mg 12.5 mg 12.5 mg       ANTICOAG SUMMARY: Anticoagulation Episode Summary   Current INR goal 2.0-3.0  Next INR check 05/30/2014  INR from last check 1.80! (05/09/2014)  Weekly max dose   Target end date Indefinite  [facility]  Preferred lab   Send INR reminders to    Indications  HYPERCOAGULABLE STATE PRIMARY [289.81] Long term current use of anticoagulant [V58.61]        Comments         ANTICOAG TODAY: Anticoagulation Summary as of 05/09/2014   INR goal 2.0-3.0  Selected INR 1.80! (05/09/2014)  Next INR check 05/30/2014  Target end date Indefinite   Indications  HYPERCOAGULABLE STATE PRIMARY [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  Patient instructed to take today's dose.  Patient verbalized understanding of these instructions.       FOLLOW-UP Return in 3 weeks (on 05/30/2014) for Follow up INR at 2:30PM.  [name], III Pharm.D., CACP

## 2014-05-20 ENCOUNTER — Ambulatory Visit
Admission: RE | Admit: 2014-05-20 | Discharge: 2014-05-20 | Disposition: A | Discharge status: Home / Self Care | Payer: Medicaid Other | Source: Ambulatory Visit

## 2014-05-20 DIAGNOSIS — Z1231 Encounter for screening mammogram for malignant neoplasm of breast: Secondary | ICD-10-CM

## 2014-05-23 ENCOUNTER — Ambulatory Visit: Payer: Medicaid Other

## 2014-05-30 ENCOUNTER — Telehealth: Payer: Self-pay | Admitting: *Deleted

## 2014-05-30 ENCOUNTER — Ambulatory Visit: Payer: Medicaid Other | Admitting: Internal Medicine

## 2014-05-30 NOTE — Telephone Encounter (Signed)
Called patient about her no show appt today and was advised she spoke with Dr Baxter Flattery 05/29/14 and was advised per lab test she does not need to keep this appt. And to follow up as needed.

## 2014-06-20 ENCOUNTER — Ambulatory Visit (INDEPENDENT_AMBULATORY_CARE_PROVIDER_SITE_OTHER): Payer: Medicaid Other | Admitting: Pharmacist

## 2014-06-20 DIAGNOSIS — D6859 Other primary thrombophilia: Secondary | ICD-10-CM

## 2014-06-20 DIAGNOSIS — Z7901 Long term (current) use of anticoagulants: Secondary | ICD-10-CM

## 2014-06-20 LAB — POCT INR: INR: 2.1

## 2014-06-20 NOTE — Patient Instructions (Signed)
Patient instructed to take medications as defined in the Anti-coagulation Track section of this encounter.  Patient instructed to take today's dose.  Patient verbalized understanding of these instructions.    

## 2014-06-20 NOTE — Progress Notes (Signed)
Anti-Coagulation Progress Note  Michele Gillespie is a 38 y.o. female who is currently on an anti-coagulation regimen.    RECENT RESULTS: Recent results are below, the most recent result is correlated with a dose of 90 mg. per week: Lab Results  Component Value Date   INR 2.10 06/20/2014   INR 1.80 05/09/2014   INR 2.30 04/11/2014    ANTI-COAG DOSE: Anticoagulation Dose Instructions as of 06/20/2014     [name] Tue Wed Thu Fri Sat   New Dose 12.5 mg 15 mg 12.5 mg 12.5 mg 15 mg 12.5 mg 12.5 mg       ANTICOAG SUMMARY: Anticoagulation Episode Summary   Current INR goal 2.0-3.0  Next INR check 07/18/2014  INR from last check 2.10 (06/20/2014)  Weekly max dose   Target end date Indefinite  [facility]  Preferred lab   Send INR reminders to    Indications  HYPERCOAGULABLE STATE PRIMARY [289.81] Long term current use of anticoagulant [V58.61]        Comments         ANTICOAG TODAY: Anticoagulation Summary as of 06/20/2014   INR goal 2.0-3.0  Selected INR 2.10 (06/20/2014)  Next INR check 07/18/2014  Target end date Indefinite   Indications  HYPERCOAGULABLE STATE PRIMARY [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  Patient instructed to take today's dose.  Patient verbalized understanding of these instructions.       FOLLOW-UP Return in 4 weeks (on 07/18/2014) for Follow up INR at 0900h.  [name], III Pharm.D., CACP

## 2014-06-20 NOTE — Progress Notes (Signed)
INTERNAL MEDICINE TEACHING ATTENDING ADDENDUM - Aldine Contes M.D  Duration- indefinite, Indication- hypercoaguable state, INR- therapeutic. Agree with Dr. Gladstone Pih recommendations as outlined in his note.

## 2014-07-03 ENCOUNTER — Encounter (HOSPITAL_COMMUNITY): Payer: Self-pay | Admitting: Emergency Medicine

## 2014-07-03 ENCOUNTER — Emergency Department (HOSPITAL_COMMUNITY)
Admission: EM | Admit: 2014-07-03 | Discharge: 2014-07-03 | Disposition: A | Discharge status: Home / Self Care | Payer: Medicaid Other | Attending: Emergency Medicine | Admitting: Emergency Medicine

## 2014-07-03 DIAGNOSIS — J45909 Unspecified asthma, uncomplicated: Secondary | ICD-10-CM | POA: Insufficient documentation

## 2014-07-03 DIAGNOSIS — Z7901 Long term (current) use of anticoagulants: Secondary | ICD-10-CM | POA: Diagnosis not present

## 2014-07-03 DIAGNOSIS — Z8659 Personal history of other mental and behavioral disorders: Secondary | ICD-10-CM | POA: Diagnosis not present

## 2014-07-03 DIAGNOSIS — Z86718 Personal history of other venous thrombosis and embolism: Secondary | ICD-10-CM | POA: Insufficient documentation

## 2014-07-03 DIAGNOSIS — H109 Unspecified conjunctivitis: Secondary | ICD-10-CM

## 2014-07-03 DIAGNOSIS — Z86711 Personal history of pulmonary embolism: Secondary | ICD-10-CM | POA: Diagnosis not present

## 2014-07-03 DIAGNOSIS — Z792 Long term (current) use of antibiotics: Secondary | ICD-10-CM | POA: Insufficient documentation

## 2014-07-03 DIAGNOSIS — Z791 Long term (current) use of non-steroidal anti-inflammatories (NSAID): Secondary | ICD-10-CM | POA: Insufficient documentation

## 2014-07-03 DIAGNOSIS — Z720 Tobacco use: Secondary | ICD-10-CM | POA: Diagnosis not present

## 2014-07-03 DIAGNOSIS — Z79899 Other long term (current) drug therapy: Secondary | ICD-10-CM | POA: Insufficient documentation

## 2014-07-03 DIAGNOSIS — Z862 Personal history of diseases of the blood and blood-forming organs and certain disorders involving the immune mechanism: Secondary | ICD-10-CM | POA: Diagnosis not present

## 2014-07-03 MED ORDER — BACITRACIN-POLYMYXIN B 500-10000 UNIT/GM OP OINT
1.0000 | TOPICAL_OINTMENT | Freq: Two times a day (BID) | OPHTHALMIC | Status: DC
Start: 2014-07-03 — End: 2015-06-17

## 2014-07-03 MED ORDER — TETRACAINE HCL 0.5 % OP SOLN
1.0000 [drp] | Freq: Once | OPHTHALMIC | Status: AC
  Administered 2014-07-03: 1 [drp] via OPHTHALMIC
  Filled 2014-07-03: qty 2

## 2014-07-03 MED ORDER — FLUORESCEIN SODIUM 1 MG OP STRP
1.0000 | ORAL_STRIP | Freq: Once | OPHTHALMIC | Status: AC
  Administered 2014-07-03: 1 via OPHTHALMIC
  Filled 2014-07-03: qty 1

## 2014-07-03 MED ORDER — DIPHENHYDRAMINE HCL 25 MG PO TABS: 25.0000 mg | ORAL_TABLET | Freq: Four times a day (QID) | ORAL | Status: DC

## 2014-07-03 NOTE — ED Provider Notes (Signed)
Medical screening examination/treatment/procedure(s) were performed by non-physician practitioner and as supervising physician I was immediately available for consultation/collaboration.    Dorie Rank, MD 07/03/14 2352

## 2014-07-03 NOTE — Discharge Instructions (Signed)
Conjunctivitis Conjunctivitis is commonly called "pink eye." Conjunctivitis can be caused by bacterial or viral infection, allergies, or injuries. There is usually redness of the lining of the eye, itching, discomfort, and sometimes discharge. There may be deposits of matter along the eyelids. A viral infection usually causes a watery discharge, while a bacterial infection causes a yellowish, thick discharge. Pink eye is very contagious and spreads by direct contact. You may be given antibiotic eyedrops as part of your treatment. Before using your eye medicine, remove all drainage from the eye by washing gently with warm water and cotton balls. Continue to use the medication until you have awakened 2 mornings in a row without discharge from the eye. Do not rub your eye. This increases the irritation and helps spread infection. Use separate towels from other household members. Wash your hands with soap and water before and after touching your eyes. Use cold compresses to reduce pain and sunglasses to relieve irritation from light. Do not wear contact lenses or wear eye makeup until the infection is gone. SEEK MEDICAL CARE IF:   Your symptoms are not better after 3 days of treatment.  You have increased pain or trouble seeing.  The outer eyelids become very red or swollen. Document Released: 10/24/2004 Document Revised: 12/09/2011 Document Reviewed: 09/16/2005 [facility] Patient Information 2015 [place], [place]. This information is not intended to replace advice given to you by your health care provider. Make sure you discuss any questions you have with your health care provider.   Please return to ED for further evaluation he began to experience eye pain, headache, changes in vision or fevers

## 2014-07-03 NOTE — ED Notes (Addendum)
C/o bilatgeral eye redness, itching, drainage (white & yellow), eyes stuck together in the morning. Nasal congestion noted. Onset 4d ago.

## 2014-07-03 NOTE — ED Provider Notes (Signed)
CSN: [id number]     Arrival date & time 07/03/14  2200 History   First MD Initiated Contact with Patient 07/03/14 2233     Chief Complaint  Patient presents with  . Conjunctivitis     (Consider location/radiation/quality/duration/timing/severity/associated sxs/prior Treatment) HPI Michele Gillespie is a 38 y.o. female who comes in for evaluation of bilateral conjunctivitis. Patient states for the past 3 days she has had red itchy eyes. She states in the mornings her eyelids are stuck together, but there is no overt discharge. She does not wear contacts. She reports taking Pataday everyday for the past year, "but it doesn't help whatever this is".  She reports the most concerning thing is that her eyes itch and she is constantly rubbing them, but denies any overt pain or burning. Denies changes in vision, headaches. She denies any other URI symptoms, no cough, rhinorrhea, congestion. Denies any sick contacts.  Past Medical History  Diagnosis Date  . Pulmonary embolism  September 07, 2005     her CT angiogram - positive for pulmonary emboli to several branches of the right lower lobe- relatively small clot burden, clear lung; patient started on Coumadin; CT angiogram on January 10, 2006 showed resolution of previously seen pulmonary emboli with minimal basilar atelectasis  . Lung nodule  June 10, 2008     stable tiny noduke noted along the minor fissure of the right lung on CT angio September 11, 09 -  stable for 2 years and consistent with benign disease  . Arm DVT (deep venous thromboembolism), acute  September 23, 2009, January 05, 2010     Doppler study significant with indeterminant age DVT involving the left upper extremity, Doppler performed January 05, 2010 consistent with acute DVT involving the left upper extremity  . Asthma   . Anemia 2007     microcytic anemia, baseline hemoglobin 10-11, MCV at baseline 72-77, secondary to iron deficiency  . Schizophrenia   . Depression   . Leukopenia 2008      unclear etiology baseline WBC  2.8-3.7   Past Surgical History  Procedure Laterality Date  . Cesarean section       History of 5 C-section  . Tubal ligation    . Exploratory laparotomy with abdominal mass excision  02/2005   Family History  Problem Relation Age of Onset  . Hypertension Mother   . Heart disease Mother   . Diabetes Father   . Birth defects Maternal Aunt   . Birth defects Maternal Uncle   . Diabetes Paternal Grandmother    History  Substance Use Topics  . Smoking status: Current Every Day Smoker -- 0.30 packs/day    Types: Cigarettes  . Smokeless tobacco: Not on file     Comment: wearing patches  . Alcohol Use: Yes     Comment: occ beer   OB History   Grav Para Term Preterm Abortions TAB SAB Ect Mult Living   6 5 5  0 0 0 0 0 0 5     Review of Systems  Constitutional: Negative for fever.  HENT: Negative for congestion and rhinorrhea.   Eyes: Positive for redness and itching.  Respiratory: Negative for cough and shortness of breath.   Cardiovascular: Negative for chest pain.  Skin: Negative for rash.      Allergies  Review of patient's allergies indicates no known allergies.  Home Medications   Prior to Admission medications   Medication Sig Start Date End Date Taking? Authorizing Provider  albuterol (PROVENTIL  HFA;VENTOLIN HFA) 108 (90 BASE) MCG/ACT inhaler Inhale 2 puffs into the lungs every 6 (six) hours as needed for wheezing. For shortness of breath 03/01/14   Ejiroghene E Denton Brick, MD  amoxicillin-clavulanate (AUGMENTIN) 875-125 MG per tablet Take 1 tablet by mouth 2 (two) times daily. 04/29/14   Carlyle Basques, MD  clindamycin (CLEOCIN) 300 MG capsule Take 300 mg by mouth 4 (four) times daily.    Historical Provider, MD  flurbiprofen (ANSAID) 100 MG tablet Take 100 mg by mouth 2 (two) times daily.    Historical Provider, MD  HYDROcodone-acetaminophen (NORCO) 5-325 MG per tablet Take 2 tablets by mouth every 4 (four) hours as needed. 03/09/14    Mariea Clonts, MD  pantoprazole (PROTONIX) 20 MG tablet Take 1 tablet (20 mg total) by mouth daily. 09/14/13 09/14/14  Otho Bellows, MD  penicillin v potassium (VEETID) 500 MG tablet Take 500 mg by mouth 4 (four) times daily.    Historical Provider, MD  warfarin (COUMADIN) 5 MG tablet Take 3 tablets on Sunday.then take 2 and 1/2 tablets all the other days 04/11/14   Axel Filler, MD   BP 126/89  Pulse 81  Temp(Src) 98.8 F (37.1 C) (Oral)  Resp 18  SpO2 99%  LMP 06/06/2014 Physical Exam  Nursing note and vitals reviewed. Constitutional:  Awake, alert, nontoxic appearance.  HENT:  Head: Atraumatic.  Eyes: EOM are normal. Pupils are equal, round, and reactive to light. Right eye exhibits no discharge. Left eye exhibits no discharge.  Mild bilateral conjunctivitis with no drainage or tearing. Extraocular movements intact. No hyphema or subconjunctival hemorrhage  Neck: Neck supple.  Pulmonary/Chest: Effort normal. No respiratory distress.  Abdominal: Soft. There is no tenderness. There is no rebound.  Musculoskeletal: She exhibits no tenderness.  Baseline ROM, no obvious new focal weakness.  Neurological:  Mental status and motor strength appears baseline for patient and situation.  Skin: No rash noted.  Psychiatric: She has a normal mood and affect.    ED Course  Procedures (including critical care time) Labs Review Labs Reviewed - No data to display  Imaging Review No results found.   EKG Interpretation None      MDM  Vitals stable - WNL -afebrile Pt resting comfortably in ED. Visual acuity within normal limits. PE--Woods lamp evaluation showed no evidence of corneal abrasion or dendritic lesions. Low concern for acute angle glaucoma due to lack of pain in timing.  Will DC with antibiotic ointment and treat for bacterial conjunctivitis since patient is already on Pataday daily. Also given Benadryl to help with itching. Discussed f/u with PCP and return  precautions, pt very amenable to plan.   Final diagnoses:  Bilateral conjunctivitis        Verl Dicker, PA-C 07/03/14 2309

## 2014-07-11 ENCOUNTER — Ambulatory Visit: Payer: Medicaid Other | Admitting: Internal Medicine

## 2014-07-11 ENCOUNTER — Telehealth: Payer: Self-pay | Admitting: *Deleted

## 2014-07-11 NOTE — Telephone Encounter (Signed)
Patient came to the clinic today and advised she is still having trouble with her mouth. She advises she is still numb and can not eat on the Left side of her mouth. She advised the swelling has gone down but she feels a shooting pain when she puts any pressure on that side. She wants to have an MRI and wants Dr Baxter Flattery to do it. i asked if she has followed up with her oral surgeon and she advised she does not want to go back to him she feels he damaged her mouth and thinks he will make it worse. Advised her will let Dr Baxter Flattery know and give her a call once she responds.

## 2014-07-12 NOTE — Telephone Encounter (Signed)
Have her come in on wed or thur

## 2014-07-13 NOTE — Telephone Encounter (Signed)
Called the patient to try and get her in but her phone said she is unavailable and is not accepting call at this time. Not able to leave a message.

## 2014-07-18 ENCOUNTER — Ambulatory Visit: Payer: Medicaid Other

## 2014-07-18 ENCOUNTER — Encounter: Payer: Self-pay | Admitting: Internal Medicine

## 2014-08-01 ENCOUNTER — Ambulatory Visit (INDEPENDENT_AMBULATORY_CARE_PROVIDER_SITE_OTHER): Payer: Medicaid Other | Admitting: Pharmacist

## 2014-08-01 ENCOUNTER — Encounter (HOSPITAL_COMMUNITY): Payer: Self-pay | Admitting: Emergency Medicine

## 2014-08-01 DIAGNOSIS — D6852 Prothrombin gene mutation: Secondary | ICD-10-CM

## 2014-08-01 DIAGNOSIS — D6859 Other primary thrombophilia: Secondary | ICD-10-CM

## 2014-08-01 DIAGNOSIS — Z7901 Long term (current) use of anticoagulants: Secondary | ICD-10-CM

## 2014-08-01 LAB — POCT INR: INR: 1.7

## 2014-08-01 MED ORDER — WARFARIN SODIUM 5 MG PO TABS: ORAL_TABLET | ORAL | Status: DC

## 2014-08-01 NOTE — Patient Instructions (Signed)
Patient instructed to take medications as defined in the Anti-coagulation Track section of this encounter.  Patient instructed to take today's dose.  Patient verbalized understanding of these instructions.    

## 2014-08-01 NOTE — Progress Notes (Signed)
Anti-Coagulation Progress Note  Michele Gillespie is a 38 y.o. female who is currently on an anti-coagulation regimen.    RECENT RESULTS: Recent results are below, the most recent result is correlated with a dose of 92.5 mg. per week: Lab Results  Component Value Date   INR 1.70 08/01/2014   INR 2.10 06/20/2014   INR 1.80 05/09/2014    ANTI-COAG DOSE: Anticoagulation Dose Instructions as of 08/01/2014      [name] Tue Wed Thu Fri Sat   New Dose 12.5 mg 15 mg 12.5 mg 15 mg 12.5 mg 15 mg 15 mg       ANTICOAG SUMMARY: Anticoagulation Episode Summary    Current INR goal 2.0-3.0  Next INR check 08/15/2014  INR from last check 1.70! (08/01/2014)  Weekly max dose   Target end date Indefinite  [facility]  Preferred lab   Send INR reminders to    Indications  [place] [D68.52] Long term current use of anticoagulant [Z79.01]        Comments         ANTICOAG TODAY: Anticoagulation Summary as of 08/01/2014    INR goal 2.0-3.0  Selected INR 1.70! (08/01/2014)  Next INR check 08/15/2014  Target end date Indefinite   Indications  [name] [D68.52] Long term current use of anticoagulant [Z79.01]      Anticoagulation Episode Summary    [facility] check [facility]   Preferred lab    Send INR reminders to    Comments       PATIENT INSTRUCTIONS: Patient Instructions  Patient instructed to take medications as defined in the Anti-coagulation Track section of this encounter.  Patient instructed to take today's dose.  Patient verbalized understanding of these instructions.       FOLLOW-UP No Follow-up on file.  [name], III Pharm.D., CACP

## 2014-08-04 ENCOUNTER — Ambulatory Visit: Payer: Medicaid Other | Admitting: Internal Medicine

## 2014-08-04 ENCOUNTER — Telehealth: Payer: Self-pay | Admitting: *Deleted

## 2014-08-04 NOTE — Telephone Encounter (Signed)
Rescheduled appt

## 2014-08-10 ENCOUNTER — Encounter (HOSPITAL_COMMUNITY): Payer: Self-pay | Admitting: *Deleted

## 2014-08-10 ENCOUNTER — Emergency Department (HOSPITAL_COMMUNITY): Payer: Medicaid Other

## 2014-08-10 ENCOUNTER — Emergency Department (HOSPITAL_COMMUNITY)
Admission: EM | Admit: 2014-08-10 | Discharge: 2014-08-10 | Disposition: A | Discharge status: Home / Self Care | Payer: Medicaid Other | Attending: Emergency Medicine | Admitting: Emergency Medicine

## 2014-08-10 DIAGNOSIS — Y9389 Activity, other specified: Secondary | ICD-10-CM | POA: Diagnosis not present

## 2014-08-10 DIAGNOSIS — Z8659 Personal history of other mental and behavioral disorders: Secondary | ICD-10-CM | POA: Insufficient documentation

## 2014-08-10 DIAGNOSIS — Z792 Long term (current) use of antibiotics: Secondary | ICD-10-CM | POA: Diagnosis not present

## 2014-08-10 DIAGNOSIS — J45909 Unspecified asthma, uncomplicated: Secondary | ICD-10-CM | POA: Diagnosis not present

## 2014-08-10 DIAGNOSIS — Z79899 Other long term (current) drug therapy: Secondary | ICD-10-CM | POA: Insufficient documentation

## 2014-08-10 DIAGNOSIS — Z720 Tobacco use: Secondary | ICD-10-CM | POA: Diagnosis not present

## 2014-08-10 DIAGNOSIS — Y92410 Unspecified street and highway as the place of occurrence of the external cause: Secondary | ICD-10-CM | POA: Diagnosis not present

## 2014-08-10 DIAGNOSIS — Z8679 Personal history of other diseases of the circulatory system: Secondary | ICD-10-CM | POA: Insufficient documentation

## 2014-08-10 DIAGNOSIS — M549 Dorsalgia, unspecified: Secondary | ICD-10-CM

## 2014-08-10 DIAGNOSIS — Y998 Other external cause status: Secondary | ICD-10-CM | POA: Insufficient documentation

## 2014-08-10 DIAGNOSIS — Z7901 Long term (current) use of anticoagulants: Secondary | ICD-10-CM | POA: Insufficient documentation

## 2014-08-10 DIAGNOSIS — Z86718 Personal history of other venous thrombosis and embolism: Secondary | ICD-10-CM | POA: Insufficient documentation

## 2014-08-10 DIAGNOSIS — S8991XA Unspecified injury of right lower leg, initial encounter: Secondary | ICD-10-CM | POA: Diagnosis not present

## 2014-08-10 DIAGNOSIS — M25569 Pain in unspecified knee: Secondary | ICD-10-CM

## 2014-08-10 MED ORDER — TRAMADOL HCL 50 MG PO TABS: 50.0000 mg | ORAL_TABLET | Freq: Four times a day (QID) | ORAL | Status: DC | PRN

## 2014-08-10 MED ORDER — METHOCARBAMOL 500 MG PO TABS: 1000.0000 mg | ORAL_TABLET | Freq: Four times a day (QID) | ORAL | Status: DC
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
## 2014-08-11 ENCOUNTER — Encounter: Payer: Self-pay | Admitting: Internal Medicine

## 2014-08-11 ENCOUNTER — Ambulatory Visit (INDEPENDENT_AMBULATORY_CARE_PROVIDER_SITE_OTHER): Payer: Medicaid Other | Admitting: Internal Medicine

## 2014-08-11 VITALS — BP 135/88 | HR 64 | Temp 98.0°F | Wt 207.0 lb

## 2014-08-11 DIAGNOSIS — R200 Anesthesia of skin: Secondary | ICD-10-CM

## 2014-08-11 DIAGNOSIS — R208 Other disturbances of skin sensation: Secondary | ICD-10-CM
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
## 2014-08-12 ENCOUNTER — Other Ambulatory Visit: Payer: Self-pay | Admitting: Internal Medicine

## 2014-08-15 ENCOUNTER — Ambulatory Visit: Payer: Medicaid Other

## 2014-09-05 ENCOUNTER — Telehealth: Payer: Self-pay | Admitting: *Deleted

## 2014-09-05 NOTE — Telephone Encounter (Signed)
This is not my pt   i am happy to discuss w med resident but i don't give phone advice on patients I have never seen

## 2014-09-05 NOTE — Telephone Encounter (Signed)
Pt's dentist office calls and request review for coumadin due to deep cleaning, wants to know what to tell her about stopping coumadin or change to lovenox 3 days prior. May call christy at [phone]

## 2014-09-06 NOTE — Telephone Encounter (Signed)
Case d/w Dr. Denton Brick. She will address issue with patient.

## 2014-09-07 ENCOUNTER — Telehealth: Payer: Self-pay | Admitting: Internal Medicine

## 2014-09-07 ENCOUNTER — Other Ambulatory Visit (INDEPENDENT_AMBULATORY_CARE_PROVIDER_SITE_OTHER): Payer: Medicaid Other

## 2014-09-07 DIAGNOSIS — D6852 Prothrombin gene mutation: Secondary | ICD-10-CM

## 2014-09-07 DIAGNOSIS — Z7901 Long term (current) use of anticoagulants: Secondary | ICD-10-CM

## 2014-09-07 LAB — POCT INR: INR: 1.2

## 2014-09-07 NOTE — Telephone Encounter (Signed)
Called patient back twice no response. Talked to my attending about patient. Pt has a hx of unprovoked PEs, and DVT documented, per chart she has had others not documented. Pt is high risk for clots and should be on anticoagulants. Pt has a hx of Non compliance.  Last INR check-  08/01/2014- 1.7,  06/20/2014- 2.1,  05/09/2014- 1.8,  04/11/2014- 2.3. 03/09/2014- 1.53 Called dental office- [phone], and recommended patient gets an INR done first to determine if she is therapeutic, and if she is not, she can continue the coumadin she is taking, and get the procedure done, as she is low risk for bleeding. But if she is therapeutic or supratherapeutic, a decision will be made to hold all meds prior or to bridge, and hold Warfarin as indicated. Talked to Dr Beryle Beams.   Ejiro.

## 2014-09-13 ENCOUNTER — Ambulatory Visit (INDEPENDENT_AMBULATORY_CARE_PROVIDER_SITE_OTHER): Payer: Medicaid Other | Admitting: Internal Medicine

## 2014-09-13 ENCOUNTER — Encounter: Payer: Self-pay | Admitting: Internal Medicine

## 2014-09-13 VITALS — BP 128/86 | HR 99 | Temp 98.9°F | Ht 64.5 in | Wt 203.0 lb

## 2014-09-13 DIAGNOSIS — J309 Allergic rhinitis, unspecified: Secondary | ICD-10-CM

## 2014-09-13 DIAGNOSIS — O926 Galactorrhea: Secondary | ICD-10-CM

## 2014-09-13 DIAGNOSIS — N912 Amenorrhea, unspecified: Secondary | ICD-10-CM

## 2014-09-13 DIAGNOSIS — N643 Galactorrhea not associated with childbirth: Secondary | ICD-10-CM

## 2014-09-13 DIAGNOSIS — N911 Secondary amenorrhea: Secondary | ICD-10-CM

## 2014-09-13 DIAGNOSIS — Z7901 Long term (current) use of anticoagulants: Secondary | ICD-10-CM

## 2014-09-13 LAB — POCT URINE PREGNANCY: Preg Test, Ur: NEGATIVE

## 2014-09-13 MED ORDER — CETIRIZINE HCL 10 MG PO TABS: 10.0000 mg | ORAL_TABLET | Freq: Every day | ORAL | Status: DC

## 2014-09-13 NOTE — Patient Instructions (Signed)
General Instructions: We will let you know if any of your resuyklst are abnormal. Please keep your appointment with Dr Elie Confer.   Please bring your medicines with you each time you come to clinic.  Medicines may include prescription medications, over-the-counter medications, herbal remedies, eye drops, vitamins, or other pills.

## 2014-09-13 NOTE — Progress Notes (Signed)
Patient ID: Michele Gillespie, female   DOB: 07-12-1976, 38 y.o.   MRN: [id number]   Subjective:   Patient ID: Michele Gillespie female   DOB: [birth year] 38 y.o.   MRN: [id number]  HPI: Michele Gillespie is a 38 y.o. with PMH listed below. Presented today with c/o of missed periods- last normal period was in sept. October she has very little spotting. Normally sees her period for 5 days, her cycle is always 28 days. No previous episodes. Normaly has cramping. Has ben told she has a fibroid- though last imaging- tranvaginal [place]- was negative. Pt has had  Tubal ligation in 1997. Mother stopped seeing her periods mid 13s to 75s. Pt endorses night sweats, no hot flashes, usually she is cold. Never had procedures, except form 5 C. Sections. 2 episodes of vomiting- last week and last month. Pt has had 2 pregnancy tests from the [facility] last month- and they were neg. No fever, no vaginal discharge. Last CS was 1997. Pt is sexually active. No particular abdominal pain. [facility], last checked for STD last year.  Pt endorses increased appetite, some weight gain- 5 lbs since July. No loss of peripheral vsision, no particular headaches except mild stress. Pt endorses clear fluid leaking from both Nipples over the past 2 months.    Past Medical History  Diagnosis Date  . Pulmonary embolism  September 07, 2005     her CT angiogram - positive for pulmonary emboli to several branches of the right lower lobe- relatively small clot burden, clear lung; patient started on Coumadin; CT angiogram on January 10, 2006 showed resolution of previously seen pulmonary emboli with minimal basilar atelectasis  . Lung nodule  June 10, 2008     stable tiny noduke noted along the minor fissure of the right lung on CT angio September 11, 09 -  stable for 2 years and consistent with benign disease  . Arm DVT (deep venous thromboembolism), acute  September 23, 2009, January 05, 2010     Doppler study  significant with indeterminant age DVT involving the left upper extremity, Doppler performed January 05, 2010 consistent with acute DVT involving the left upper extremity  . Asthma   . Anemia 2007     microcytic anemia, baseline hemoglobin 10-11, MCV at baseline 72-77, secondary to iron deficiency  . Schizophrenia   . Depression   . Leukopenia 2008     unclear etiology baseline WBC  2.8-3.7   Current Outpatient Prescriptions  Medication Sig Dispense Refill  . albuterol (PROVENTIL HFA;VENTOLIN HFA) 108 (90 BASE) MCG/ACT inhaler Inhale 2 puffs into the lungs every 6 (six) hours as needed for wheezing. For shortness of breath 1 Inhaler 3  . bacitracin-polymyxin b (POLYSPORIN) ophthalmic ointment Place 1 application into both eyes every 12 (twelve) hours. apply to eye every 12 hours while awake 3.5 g 0  . diphenhydrAMINE (BENADRYL) 25 MG tablet Take 1 tablet (25 mg total) by mouth every 6 (six) hours. 20 tablet 0  . methocarbamol (ROBAXIN) 500 MG tablet Take 2 tablets (1,000 mg total) by mouth 4 (four) times daily. 20 tablet 0  . pantoprazole (PROTONIX) 20 MG tablet Take 1 tablet (20 mg total) by mouth daily. 30 tablet 1  . PATADAY 0.2 % SOLN INSTILL 1 DROP TO EYE DAILY. 2.5 mL 2  . traMADol (ULTRAM) 50 MG tablet Take 1 tablet (50 mg total) by mouth every 6 (six) hours as needed. 15 tablet 0  .  warfarin (COUMADIN) 5 MG tablet Take 3 tablets on Mondays/Wednesdays/Fridays/Saturdays; Take 2&1/2 tablets all other days. 80 tablet 2   No current facility-administered medications for this visit.   Family History  Problem Relation Age of Onset  . Hypertension Mother   . Heart disease Mother   . Diabetes Father   . Birth defects Maternal Aunt   . Birth defects Maternal Uncle   . Diabetes Paternal Grandmother    History   Social History  . Marital Status: Married    Spouse Name: N/A    Number of Children: N/A  . Years of Education: N/A   Social History Main Topics  . Smoking status: Current  Every Day Smoker -- 0.30 packs/day    Types: Cigarettes  . Smokeless tobacco: Never Used     Comment: wearing patches  . Alcohol Use: 0.0 oz/week    0 Not specified per week     Comment: occ beer  . Drug Use: Yes    Special: Marijuana     Comment: every day  . Sexual Activity: Yes    Birth Control/ Protection: None   Other Topics Concern  . Not on file   Social History Narrative    Works as a Quarry manager, 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, has used cocaine in the past, history of multiple incarcerations last one in November 2011   Review of Systems: CONSTITUTIONAL- No Fever, weightloss, night sweat or change in appetite. SKIN- No Rash, colour changes or itching. HEAD- No Headache or dizziness. EYES- No Vision loss, pain, redness, double or blurred vision. EARS- No vertigo, hearing loss or ear discharge. Mouth/throat- No Sorethroat, dentures, or bleeding gums. RESPIRATORY- No Cough or SOB. CARDIAC- No Palpitations, DOE, PND or chest pain. GI- No nausea, vomiting, diarrhoea, constipation, abd pain. URINARY- No Frequency, urgency, straining or dysuria. NEUROLOGIC- No Numbness, syncope, seizures or burning. [facility]- Denies depression or anxiety.  Objective:  Physical Exam: Filed Vitals:   09/13/14 1541  BP: 128/86  Pulse: 99  Temp: 98.9 F (37.2 C)  TempSrc: Oral  Height: 5' 4.5" (1.638 m)  Weight: 203 lb (92.08 kg)  SpO2: 100%  GENERAL- alert, co-operative, appears as stated age, not in any distress. HEENT- Atraumatic, normocephalic, PERRL, EOMI, oral mucosa appears moist, neck supple. CARDIAC- RRR, no murmurs, rubs or gallops. Breast exam- both nipples expressed clear discharge, expressed by patient. No masses appreciated. Guiaic test done on discharge negative for blood. RESP- Moving equal volumes of air, and clear to auscultation bilaterally, no wheezes or crackles. ABDOMEN- Soft, nontender, no guarding or rebound, no palpable masses or organomegaly, bowel sounds  present. BACK- Normal curvature of the spine, No tenderness along the vertebrae, no CVA tenderness. NEURO- No obvious Cr N abnormality, strenght upper and lower extremities- intact, Gait- Normal. EXTREMITIES- pulse 2+, symmetric, no pedal edema. SKIN- Warm, dry, No rash or lesion. PSYCH- Normal mood and affect, appropriate thought content and speech.  Assessment & Plan:   The patient's case and plan of care was discussed with attending physician, Dr. Daryll Drown.  Please see problem based charting for assessment and plan.

## 2014-09-14 DIAGNOSIS — N911 Secondary amenorrhea: Secondary | ICD-10-CM | POA: Insufficient documentation

## 2014-09-14 DIAGNOSIS — J309 Allergic rhinitis, unspecified: Secondary | ICD-10-CM | POA: Insufficient documentation

## 2014-09-14 LAB — PROLACTIN: Prolactin: 3.7 ng/mL

## 2014-09-14 LAB — TSH: TSH: 1.012 u[IU]/mL (ref 0.350–4.500)

## 2014-09-14 NOTE — Assessment & Plan Note (Signed)
Pt says she used to use Cocaine- Sniffed through her nose, in the distant past. Now with nasal congestion all the time. Has tried benadryl without relief. Will call in cetirizine. If this doesn't work, can then try Flonase.   - Cetirizine- 10mg  daily.

## 2014-09-14 NOTE — Assessment & Plan Note (Addendum)
Hx suggestive of galactorrhea- with clear discharge from nipples. Differential also include- thyroid dysfunction, Prolactinoma, Stress, pregnancy, premature menopause. CT Pelvis- 01/2013- Previously described endometrial canal fluid and cervical mass are not evident on today's examination. The endometrial fluid has resolved. The cervical mass could persist, and be less evident without the endometrial fluid.  Pt was referred to [facility], but subsequent vaginal [place] failed to demonstrate a mass.  Plan- Urine pregnancy test- Negative. - Guiaic test done on nipple discharge negative for blood. - TSH - Prolactine. - FSH and might require imaging-Complete pelvic US.  Addendum- TSH and Prolactine WNL.  - Will refer to Gynecology

## 2014-09-14 NOTE — Assessment & Plan Note (Signed)
Patient has been taking her coumadin. Last INR- 1.2. 09/07/2014. She has had her dental work done. She will be following up with Dr. Elie Confer on Monday. 09/19/2014.  No bleeding after her procedure.

## 2014-09-15 ENCOUNTER — Other Ambulatory Visit: Payer: Self-pay

## 2014-09-15 ENCOUNTER — Telehealth: Payer: Self-pay | Admitting: Internal Medicine

## 2014-09-15 ENCOUNTER — Emergency Department (HOSPITAL_COMMUNITY)
Admission: EM | Admit: 2014-09-15 | Discharge: 2014-09-16 | Disposition: A | Discharge status: Home / Self Care | Payer: Medicaid Other | Attending: Emergency Medicine | Admitting: Emergency Medicine

## 2014-09-15 ENCOUNTER — Encounter (HOSPITAL_COMMUNITY): Payer: Self-pay | Admitting: Emergency Medicine

## 2014-09-15 ENCOUNTER — Emergency Department (HOSPITAL_COMMUNITY): Payer: Medicaid Other

## 2014-09-15 DIAGNOSIS — R079 Chest pain, unspecified: Secondary | ICD-10-CM | POA: Diagnosis not present

## 2014-09-15 DIAGNOSIS — M79602 Pain in left arm: Secondary | ICD-10-CM | POA: Diagnosis not present

## 2014-09-15 DIAGNOSIS — Z7901 Long term (current) use of anticoagulants: Secondary | ICD-10-CM

## 2014-09-15 DIAGNOSIS — Z720 Tobacco use: Secondary | ICD-10-CM | POA: Diagnosis not present

## 2014-09-15 DIAGNOSIS — J45909 Unspecified asthma, uncomplicated: Secondary | ICD-10-CM | POA: Diagnosis not present

## 2014-09-15 DIAGNOSIS — R0789 Other chest pain: Secondary | ICD-10-CM

## 2014-09-15 DIAGNOSIS — Z79899 Other long term (current) drug therapy: Secondary | ICD-10-CM | POA: Insufficient documentation

## 2014-09-15 DIAGNOSIS — Z8659 Personal history of other mental and behavioral disorders: Secondary | ICD-10-CM | POA: Insufficient documentation

## 2014-09-15 DIAGNOSIS — Z86718 Personal history of other venous thrombosis and embolism: Secondary | ICD-10-CM

## 2014-09-15 DIAGNOSIS — M79622 Pain in left upper arm: Secondary | ICD-10-CM

## 2014-09-15 DIAGNOSIS — Z86711 Personal history of pulmonary embolism: Secondary | ICD-10-CM | POA: Diagnosis not present

## 2014-09-15 DIAGNOSIS — Z862 Personal history of diseases of the blood and blood-forming organs and certain disorders involving the immune mechanism: Secondary | ICD-10-CM | POA: Diagnosis not present

## 2014-09-15 LAB — CBC
HCT: 36.9 % (ref 36.0–46.0)
Hemoglobin: 11.6 g/dL — ABNORMAL LOW (ref 12.0–15.0)
MCH: 22.7 pg — ABNORMAL LOW (ref 26.0–34.0)
MCHC: 31.4 g/dL (ref 30.0–36.0)
MCV: 72.2 fL — ABNORMAL LOW (ref 78.0–100.0)
PLATELETS: 171 10*3/uL (ref 150–400)
RBC: 5.11 MIL/uL (ref 3.87–5.11)
RDW: 18.9 % — ABNORMAL HIGH (ref 11.5–15.5)
WBC: 2.7 10*3/uL — AB (ref 4.0–10.5)

## 2014-09-15 LAB — BASIC METABOLIC PANEL
Anion gap: 11 (ref 5–15)
BUN: 16 mg/dL (ref 6–23)
CO2: 22 mEq/L (ref 19–32)
Calcium: 9.1 mg/dL (ref 8.4–10.5)
Chloride: 106 mEq/L (ref 96–112)
Creatinine, Ser: 1 mg/dL (ref 0.50–1.10)
GFR calc Af Amer: 81 mL/min — ABNORMAL LOW (ref >90)
GFR, EST NON AFRICAN AMERICAN: 69 mL/min — AB (ref >90)
Glucose, Bld: 90 mg/dL (ref 70–99)
Potassium: 4.1 mEq/L (ref 3.7–5.3)
Sodium: 139 mEq/L (ref 137–147)

## 2014-09-15 LAB — PROTIME-INR
INR: 1.88 — ABNORMAL HIGH (ref 0.00–1.49)
Prothrombin Time: 21.8 seconds — ABNORMAL HIGH (ref 11.6–15.2)

## 2014-09-15 LAB — I-STAT BETA HCG BLOOD, ED (MC, WL, AP ONLY): I-stat hCG, quantitative: <5 m[IU]/mL (ref <5)

## 2014-09-15 MED ORDER — ENOXAPARIN SODIUM 100 MG/ML ~~LOC~~ SOLN
1.0000 mg/kg | Freq: Once | SUBCUTANEOUS | Status: AC
  Administered 2014-09-15: 90 mg via SUBCUTANEOUS
  Filled 2014-09-15: qty 1

## 2014-09-15 MED ORDER — OXYCODONE-ACETAMINOPHEN 5-325 MG PO TABS: ORAL_TABLET | ORAL | Status: DC

## 2014-09-15 MED ORDER — HYDROMORPHONE HCL 1 MG/ML IJ SOLN
0.5000 mg | Freq: Once | INTRAMUSCULAR | Status: AC
  Administered 2014-09-15: 0.5 mg via INTRAVENOUS
  Filled 2014-09-15: qty 1

## 2014-09-15 MED ORDER — MORPHINE SULFATE 4 MG/ML IJ SOLN
4.0000 mg | Freq: Once | INTRAMUSCULAR | Status: AC
  Administered 2014-09-15: 4 mg via INTRAVENOUS
  Filled 2014-09-15: qty 1

## 2014-09-15 MED ORDER — SODIUM CHLORIDE 0.9 % IV SOLN
Freq: Once | INTRAVENOUS | Status: AC
  Administered 2014-09-15: 22:00:00 via INTRAVENOUS

## 2014-09-15 MED ORDER — IOHEXOL 350 MG/ML SOLN
100.0000 mL | Freq: Once | INTRAVENOUS | Status: AC | PRN
  Administered 2014-09-15: 100 mL via INTRAVENOUS

## 2014-09-15 NOTE — ED Notes (Signed)
Patient arrives with complaint of left arm pain starting Monday morning. States previous history of 5 blood clots in the same arm. Currently on warfarin, reports that INR was to low at last check about 1 week ago. Took 2 vicodin, muscle relaxer, and applied biofreeze to arm without effect.

## 2014-09-15 NOTE — ED Provider Notes (Signed)
CSN: [id number]     Arrival date & time 09/15/14  2001 History   First MD Initiated Contact with Patient 09/15/14 2032     Chief Complaint  Patient presents with  . Arm Pain  . DVT     (Consider location/radiation/quality/duration/timing/severity/associated sxs/prior Treatment) HPI  Michele Gillespie is a 38 y.o. 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 Patient states that she has been taking her Coumadin regularly however her INR has been subtherapeutic at last check in November. States that pain is severe, located on the upper arm and is typical when she had DVTs in this extremity in the past. She denies fever, chills, shortness of breath. She does report a dry cough. Patient is taking Vicodin, muscle relaxer and applying topical Biofreeze at home with little relief. She has been compliant with her Coumadin and dietary restrictions.   Past Medical History  Diagnosis Date  . Pulmonary embolism  September 07, 2005     her CT angiogram - positive for pulmonary emboli to several branches of the right lower lobe- relatively small clot burden, clear lung; patient started on Coumadin; CT angiogram on January 10, 2006 showed resolution of previously seen pulmonary emboli with minimal basilar atelectasis  . Lung nodule  June 10, 2008     stable tiny noduke noted along the minor fissure of the right lung on CT angio September 11, 09 -  stable for 2 years and consistent with benign disease  . Arm DVT (deep venous thromboembolism), acute  September 23, 2009, January 05, 2010     Doppler study significant with indeterminant age DVT involving the left upper extremity, Doppler performed January 05, 2010 consistent with acute DVT involving the left upper extremity  . Asthma   . Anemia 2007     microcytic anemia, baseline hemoglobin 10-11, MCV at baseline 72-77,  secondary to iron deficiency  . Schizophrenia   . Depression   . Leukopenia 2008     unclear etiology baseline WBC  2.8-3.7   Past Surgical History  Procedure Laterality Date  . Cesarean section       History of 5 C-section  . Tubal ligation    . Exploratory laparotomy with abdominal mass excision  02/2005   Family History  Problem Relation Age of Onset  . Hypertension Mother   . Heart disease Mother   . Diabetes Father   . Birth defects Maternal Aunt   . Birth defects Maternal Uncle   . Diabetes Paternal Grandmother    History  Substance Use Topics  . Smoking status: Current Every Day Smoker -- 0.30 packs/day    Types: Cigarettes  . Smokeless tobacco: Never Used     Comment: wearing patches 5-7 CIGARETTES PER DAY(OUT OF PATCHES)  . Alcohol Use: 0.0 oz/week    0 Not specified per week     Comment: occ beer   OB History    Gravida Para Term Preterm AB TAB SAB Ectopic Multiple Living   6 5 5  0 0 0 0 0 0 5     Review of Systems  10 systems reviewed and found to be negative, except as noted in the HPI.  Allergies  Review of patient's allergies indicates no known allergies.  Home Medications   Prior to Admission medications   Medication Sig Start Date End Date Taking? Authorizing Provider  albuterol (PROVENTIL  HFA;VENTOLIN HFA) 108 (90 BASE) MCG/ACT inhaler Inhale 2 puffs into the lungs every 6 (six) hours as needed for wheezing. For shortness of breath 03/01/14  Yes Ejiroghene E Emokpae, MD  HYDROcodone-acetaminophen (NORCO) 10-325 MG per tablet Take 1-2 tablets by mouth every 6 (six) hours as needed for severe pain.   Yes Historical Provider, MD  methocarbamol (ROBAXIN) 500 MG tablet Take 2 tablets (1,000 mg total) by mouth 4 (four) times daily. 08/10/14  Yes Carlisle Cater, PA-C  pantoprazole (PROTONIX) 20 MG tablet Take 1 tablet (20 mg total) by mouth daily. 09/14/13 09/15/14 Yes Otho Bellows, MD  PATADAY 0.2 % SOLN INSTILL 1 DROP TO EYE DAILY. 08/16/14  Yes  Ejiroghene E Emokpae, MD  traMADol (ULTRAM) 50 MG tablet Take 1 tablet (50 mg total) by mouth every 6 (six) hours as needed. 08/10/14  Yes Carlisle Cater, PA-C  warfarin (COUMADIN) 5 MG tablet Take 3 tablets on Mondays/Wednesdays/Fridays/Saturdays; Take 2&1/2 tablets all other days. Patient taking differently: Take 12.5-15 mg by mouth See admin instructions. Take 3 tablets on Mondays/Wednesdays/Fridays/Saturdays; Take 2&1/2 tablets all other days. 08/01/14  Yes Bartholomew Crews, MD  bacitracin-polymyxin b (POLYSPORIN) ophthalmic ointment Place 1 application into both eyes every 12 (twelve) hours. apply to eye every 12 hours while awake 07/03/14   Viona Gilmore Cartner, PA-C  cetirizine (ZYRTEC) 10 MG tablet Take 1 tablet (10 mg total) by mouth daily. 09/13/14   Ejiroghene Arlyce Dice, MD  diphenhydrAMINE (BENADRYL) 25 MG tablet Take 1 tablet (25 mg total) by mouth every 6 (six) hours. 07/03/14   Verl Dicker, PA-C  oxyCODONE-acetaminophen (PERCOCET/ROXICET) 5-325 MG per tablet 1 to 2 tabs PO q6hrs  PRN for pain 09/15/14   Elmyra Ricks Karigan Cloninger, PA-C   BP 138/86 mmHg  Pulse 66  Temp(Src) 97.7 F (36.5 C) (Oral)  Resp 17  Ht 5' 4.5" (1.638 m)  Wt 203 lb (92.08 kg)  BMI 34.32 kg/m2  SpO2 100%  LMP 07/06/2014 (Exact Date) Physical Exam  Constitutional: She is oriented to person, place, and time. She appears well-developed and well-nourished. No distress.  HENT:  Head: Normocephalic and atraumatic.  Mouth/Throat: Oropharynx is clear and moist.  Eyes: Conjunctivae and EOM are normal. Pupils are equal, round, and reactive to light.  Neck: Normal range of motion.  Cardiovascular: Normal rate, regular rhythm and intact distal pulses.   Pulmonary/Chest: Effort normal and breath sounds normal. No stridor. No respiratory distress. She has no wheezes. She has no rales. She exhibits no tenderness.  Abdominal: Soft. She exhibits no distension and no mass. There is no tenderness. There is no rebound and no  guarding.  Musculoskeletal: Normal range of motion. She exhibits tenderness.       Arms: Area of pain, no overlying skin changes, no palpable cords.  Neurological: She is alert and oriented to person, place, and time.  Psychiatric: She has a normal mood and affect.  Nursing note and vitals reviewed.   ED Course  Procedures (including critical care time) Labs Review Labs Reviewed  CBC - Abnormal; Notable for the following:    WBC 2.7 (*)    Hemoglobin 11.6 (*)    MCV 72.2 (*)    MCH 22.7 (*)    RDW 18.9 (*)    All other components within normal limits  BASIC METABOLIC PANEL - Abnormal; Notable for the following:    GFR calc non Af Amer 69 (*)    GFR calc Af Amer 81 (*)    All other components  within normal limits  PROTIME-INR - Abnormal; Notable for the following:    Prothrombin Time 21.8 (*)    INR 1.88 (*)    All other components within normal limits  I-STAT BETA HCG BLOOD, ED ([facility], [facility], [facility] ONLY)    Imaging Review Ct Angio Chest Pe W/cm &/or Wo Cm  09/15/2014   CLINICAL DATA:  Shortness of breath with exertion in mid chest pain beginning last week. LEFT arm pain beginning 4 days ago. History of pulmonary embolism.  EXAM: CT ANGIOGRAPHY CHEST WITH CONTRAST  TECHNIQUE: Multidetector CT imaging of the chest was performed using the standard protocol during bolus administration of intravenous contrast. Multiplanar CT image reconstructions and MIPs were obtained to evaluate the vascular anatomy.  CONTRAST:  134mL OMNIPAQUE IOHEXOL 350 MG/ML SOLN  COMPARISON:  CT angiogram of the chest September 09, 2013  FINDINGS: Mild respiratory motion degraded examination.  Adequate contrast opacification of the pulmonary artery's. Main pulmonary artery is not enlarged. No pulmonary arterial filling defects to the level of the subsegmental branches.  Heart and pericardium are unremarkable, no right heart strain. Thoracic aorta is normal course and caliber, unremarkable. No lymphadenopathy by CT size  criteria. Small calcified hilar lymph nodes. Tracheobronchial tree is patent, no pneumothorax. Dependent atelectasis. Mild diffuse bronchial wall thickening. No pleural effusions or focal consolidation.  Included view of the abdomen is unremarkable. Visualized soft tissues and included osseous structures non acute; mild degenerative change of the thoracic spine.  Review of the MIP images confirms the above findings.  IMPRESSION: No acute pulmonary embolism on this mild respiratory motion degraded examination.  Mild bronchial wall thickening can be seen with reactive airway disease or bronchitis, no focal consolidation.   Electronically Signed   By: Elon Alas   On: 09/15/2014 23:11     EKG Interpretation   Date/Time:  Thursday September 15 2014 20:25:13 EST Ventricular Rate:  85 PR Interval:  144 QRS Duration: 70 QT Interval:  374 QTC Calculation: 445 R Axis:   43 Text Interpretation:  Normal sinus rhythm Borderline ECG Confirmed by RAY  MD, DANIELLE ([id number]) on 09/15/2014 8:28:10 PM      MDM   Final diagnoses:  Chest pain  Arm pain, superior, left  Chronic anticoagulation  History of DVT (deep vein thrombosis)   Filed Vitals:   09/15/14 2145 09/15/14 2200 09/15/14 2215 09/15/14 2329  BP: 118/83 121/88 116/91 138/86  Pulse: 71 72 68 66  Temp:    97.7 F (36.5 C)  TempSrc:    Oral  Resp: 19 16 22 17   Height:      Weight:      SpO2: 99% 97% 97% 100%    Medications  morphine 4 MG/ML injection 4 mg (4 mg Intravenous Given 09/15/14 2151)  0.9 %  sodium chloride infusion ( Intravenous New Bag/Given 09/15/14 2148)  iohexol (OMNIPAQUE) 350 MG/ML injection 100 mL (100 mLs Intravenous Contrast Given 09/15/14 2237)  enoxaparin (LOVENOX) injection 90 mg (90 mg Subcutaneous Given 09/15/14 2343)  HYDROmorphone (DILAUDID) injection 0.5 mg (0.5 mg Intravenous Given 09/15/14 2340)    Michele Gillespie is a pleasant 38 y.o. female presenting with severe upper left arm pain. States that  this pain is similar to prior DVTs which she's had this on several times. She is anticoagulated with Coumadin. States her INR was subtherapeutic week ago. Unable to obtain venous Doppler in the ED overnight. INR is found to be approaching therapeutic at 1.88. CT PE does not find any pulmonary  embolus. I have discussed the case with internal medicine resident Dr. Danley Danker he will expedite an appointment for the patient. I will put an order for vascular study in the morning. Patient is given a shot of Lovenox in the ED. I have discussed Coumadin dosing with the pharmacist who does not recommend any changes at this time. We've had an extensive discussion of return precautions. Patient verbalized her understanding and agrees with care plan.  Discussed case with attending MD who agrees with plan and stability to d/c to home.   Evaluation does not show pathology that would require ongoing emergent intervention or inpatient treatment. Pt is hemodynamically stable and mentating appropriately. Discussed findings and plan with patient/guardian, who agrees with care plan. All questions answered. Return precautions discussed and outpatient follow up given.   New Prescriptions   OXYCODONE-ACETAMINOPHEN (PERCOCET/ROXICET) 5-325 MG PER TABLET    1 to 2 tabs PO q6hrs  PRN for pain          Monico Blitz, PA-C 09/16/14 0011  Orpah Greek, MD 09/16/14 [phone]

## 2014-09-15 NOTE — Telephone Encounter (Signed)
I received a phone call from the ED Elmyra Ricks Pisciotta, PA) about this patient who was seen in the ED this evening for concern of upper extremity dvt. CTA chest - negative for PE. Upper extremity doppler can not be performed this evening so ED plan is to have patient come back tomorrow to get it performed. ED plans to discharge her home on pain control. I was requested by ED to assist in arranging outpatient follow up. I was also made aware that  patient might require Xarelto as she has been sub-therapeutic on her Coumadin despite being compliant. I have sent an Epic message to [facility] to contact patient for an appointment on Monday 09/19/2014. I have copied in Dr Elie Confer too in order to assist with Xarelto.

## 2014-09-15 NOTE — Progress Notes (Signed)
Internal Medicine Clinic Attending  Case discussed with Dr. Emokpae soon after the resident saw the patient.  We reviewed the resident's history and exam and pertinent patient test results.  I agree with the assessment, diagnosis, and plan of care documented in the resident's note. 

## 2014-09-15 NOTE — Discharge Instructions (Signed)
Continue to take your Coumadin as prescribed.  Go to the vascular lab tomorrow morning for a Doppler study of your arm to evaluate DVT.  You'll receive a phone call from the internal medicine service to set up an appointment for you on Monday. Do not hesitate to return to the ED for any new, worsening or concerning symptoms.  Take percocet for breakthrough pain, do not drink alcohol, drive, care for children or do other critical tasks while taking percocet.

## 2014-09-16 ENCOUNTER — Ambulatory Visit (HOSPITAL_COMMUNITY)
Admission: RE | Admit: 2014-09-16 | Discharge: 2014-09-16 | Disposition: A | Discharge status: Home / Self Care | Payer: Medicaid Other | Source: Ambulatory Visit | Attending: Emergency Medicine | Admitting: Emergency Medicine

## 2014-09-16 DIAGNOSIS — M25512 Pain in left shoulder: Secondary | ICD-10-CM | POA: Insufficient documentation

## 2014-09-16 DIAGNOSIS — Z86718 Personal history of other venous thrombosis and embolism: Secondary | ICD-10-CM | POA: Diagnosis present

## 2014-09-16 DIAGNOSIS — M79609 Pain in unspecified limb: Secondary | ICD-10-CM

## 2014-09-16 DIAGNOSIS — M79622 Pain in left upper arm: Secondary | ICD-10-CM | POA: Diagnosis not present

## 2014-09-16 NOTE — Progress Notes (Signed)
VASCULAR LAB PRELIMINARY  PRELIMINARY  PRELIMINARY  PRELIMINARY  Left upper extremity venous duplex completed.    Preliminary report:  Left:  No evidence of DVT or superficial thrombosis.    Amanda Steuart, RVT 09/16/2014, 1:35 PM

## 2014-09-26 ENCOUNTER — Ambulatory Visit (INDEPENDENT_AMBULATORY_CARE_PROVIDER_SITE_OTHER): Payer: Medicaid Other | Admitting: Pharmacist

## 2014-09-26 DIAGNOSIS — D6859 Other primary thrombophilia: Secondary | ICD-10-CM

## 2014-09-26 DIAGNOSIS — D6852 Prothrombin gene mutation: Secondary | ICD-10-CM

## 2014-09-26 DIAGNOSIS — Z7901 Long term (current) use of anticoagulants: Secondary | ICD-10-CM

## 2014-09-26 LAB — POCT INR: INR: 2.1

## 2014-09-26 NOTE — Patient Instructions (Signed)
Patient instructed to take medications as defined in the Anti-coagulation Track section of this encounter.  Patient instructed to take today's dose.  Patient verbalized understanding of these instructions.    

## 2014-09-26 NOTE — Progress Notes (Signed)
Anti-Coagulation Progress Note  Michele Gillespie is a 38 y.o. female who reports to the clinic for monitoring of anticoagulation treatment.    RECENT RESULTS: Recent results are below, the most recent result is correlated with a dose of 97.5 mg. per week: Lab Results  Component Value Date   INR 2.1 09/26/2014   INR 1.88* 09/15/2014   INR 1.2 09/07/2014    Weekly dose was unchanged   ANTI-COAG DOSE: INR as of 09/26/2014 and Previous Dosing Information    INR Dt INR Goal [employer] Sun Mon Tue Wed Thu Fri Sat   09/26/2014 2.1 2.0-3.0 97.5 mg 12.5 mg 15 mg 12.5 mg 15 mg 12.5 mg 15 mg 15 mg    Anticoagulation Dose Instructions as of 09/26/2014      Total Sun Mon Tue Wed Thu Fri Sat   New Dose 97.5 mg 12.5 mg 15 mg 12.5 mg 15 mg 12.5 mg 15 mg 15 mg     (5 mg x 2.5)  (5 mg x 3)  (5 mg x 2.5)  (5 mg x 3)  (5 mg x 2.5)  (5 mg x 3)  (5 mg x 3)                           ANTICOAG SUMMARY: Anticoagulation Episode Summary    Current INR goal 2.0-3.0  Next INR check 10/24/2014  INR from last check 2.1 (09/26/2014)  Weekly max dose   Target end date Indefinite  [facility] check location [facility]  Preferred lab   Send INR reminders to    Indications  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  Patient instructed to take today's dose.  Patient verbalized understanding of these instructions.       FOLLOW-UP Return in about 4 weeks (around 10/24/2014) for Follow up INR on 10/25/14 at 2:00 pm.  Flossie Dibble, PharmD BCPS, BCACP

## 2014-09-28 ENCOUNTER — Encounter: Payer: Self-pay | Admitting: Obstetrics & Gynecology

## 2014-10-25 ENCOUNTER — Emergency Department (HOSPITAL_COMMUNITY)
Admission: EM | Admit: 2014-10-25 | Discharge: 2014-10-25 | Disposition: A | Discharge status: Home / Self Care | Payer: Medicaid Other | Attending: Emergency Medicine | Admitting: Emergency Medicine

## 2014-10-25 ENCOUNTER — Encounter (HOSPITAL_COMMUNITY): Payer: Self-pay | Admitting: *Deleted

## 2014-10-25 ENCOUNTER — Emergency Department (HOSPITAL_COMMUNITY): Payer: Medicaid Other

## 2014-10-25 DIAGNOSIS — Y9289 Other specified places as the place of occurrence of the external cause: Secondary | ICD-10-CM | POA: Insufficient documentation

## 2014-10-25 DIAGNOSIS — Z86711 Personal history of pulmonary embolism: Secondary | ICD-10-CM | POA: Diagnosis not present

## 2014-10-25 DIAGNOSIS — J45909 Unspecified asthma, uncomplicated: Secondary | ICD-10-CM | POA: Diagnosis not present

## 2014-10-25 DIAGNOSIS — S4992XA Unspecified injury of left shoulder and upper arm, initial encounter: Secondary | ICD-10-CM | POA: Diagnosis not present

## 2014-10-25 DIAGNOSIS — Y998 Other external cause status: Secondary | ICD-10-CM | POA: Diagnosis not present

## 2014-10-25 DIAGNOSIS — Z7901 Long term (current) use of anticoagulants: Secondary | ICD-10-CM | POA: Diagnosis not present

## 2014-10-25 DIAGNOSIS — Z86718 Personal history of other venous thrombosis and embolism: Secondary | ICD-10-CM | POA: Diagnosis not present

## 2014-10-25 DIAGNOSIS — W19XXXA Unspecified fall, initial encounter: Secondary | ICD-10-CM

## 2014-10-25 DIAGNOSIS — Y9389 Activity, other specified: Secondary | ICD-10-CM | POA: Insufficient documentation

## 2014-10-25 DIAGNOSIS — Z862 Personal history of diseases of the blood and blood-forming organs and certain disorders involving the immune mechanism: Secondary | ICD-10-CM | POA: Insufficient documentation

## 2014-10-25 DIAGNOSIS — W1830XA Fall on same level, unspecified, initial encounter: Secondary | ICD-10-CM | POA: Insufficient documentation

## 2014-10-25 DIAGNOSIS — Z720 Tobacco use: Secondary | ICD-10-CM | POA: Insufficient documentation

## 2014-10-25 DIAGNOSIS — Z79899 Other long term (current) drug therapy: Secondary | ICD-10-CM | POA: Insufficient documentation

## 2014-10-25 DIAGNOSIS — F329 Major depressive disorder, single episode, unspecified: Secondary | ICD-10-CM | POA: Diagnosis not present

## 2014-10-25 MED ORDER — OXYCODONE-ACETAMINOPHEN 5-325 MG PO TABS: 2.0000 | ORAL_TABLET | Freq: Four times a day (QID) | ORAL | Status: DC | PRN

## 2014-10-25 MED ORDER — OXYCODONE-ACETAMINOPHEN 5-325 MG PO TABS
1.0000 | ORAL_TABLET | Freq: Once | ORAL | Status: AC
  Administered 2014-10-25: 1 via ORAL
  Filled 2014-10-25: qty 1

## 2014-10-25 NOTE — ED Notes (Signed)
Pt reports having a fall today and has severe left shoulder pain. Decreased rom, able to move digits and +radial pulse.

## 2014-10-25 NOTE — ED Provider Notes (Signed)
CSN: [id number]     Arrival date & time 10/25/14  1610 History   First MD Initiated Contact with Patient 10/25/14 1829     This chart was scribed for non-physician practitioner, Montine Circle PA-C working with Pamella Pert, MD by Forrestine Him, ED Scribe. This patient was seen in room TR04C/TR04C and the patient's care was started at 6:41 PM.   Chief Complaint  Patient presents with  . Fall  . Shoulder Pain   HPI  HPI Comments: Michele Gillespie is a 39 y.o. female with a PMHx of pulmonary embolism, DVT, and schizophrenia who presents to the Emergency Department complaining of a fall sustained earlier today. Pt states she fell on the ice and says her husband landed on top of her afterwards. She now c/o constant, moderate L shoulder pain that is unchanged at this time. Pain is exacerbated with abduction and movement of shoulder. SHe has not tried any OTC medications to help manage symptoms. No known allergies to medications.  Past Medical History  Diagnosis Date  . Pulmonary embolism  September 07, 2005     her CT angiogram - positive for pulmonary emboli to several branches of the right lower lobe- relatively small clot burden, clear lung; patient started on Coumadin; CT angiogram on January 10, 2006 showed resolution of previously seen pulmonary emboli with minimal basilar atelectasis  . Lung nodule  June 10, 2008     stable tiny noduke noted along the minor fissure of the right lung on CT angio September 11, 09 -  stable for 2 years and consistent with benign disease  . Arm DVT (deep venous thromboembolism), acute  September 23, 2009, January 05, 2010     Doppler study significant with indeterminant age DVT involving the left upper extremity, Doppler performed January 05, 2010 consistent with acute DVT involving the left upper extremity  . Asthma   . Anemia 2007     microcytic anemia, baseline hemoglobin 10-11, MCV at baseline 72-77, secondary to iron deficiency  . Schizophrenia   .  Depression   . Leukopenia 2008     unclear etiology baseline WBC  2.8-3.7   Past Surgical History  Procedure Laterality Date  . Cesarean section       History of 5 C-section  . Tubal ligation    . Exploratory laparotomy with abdominal mass excision  02/2005   Family History  Problem Relation Age of Onset  . Hypertension Mother   . Heart disease Mother   . Diabetes Father   . Birth defects Maternal Aunt   . Birth defects Maternal Uncle   . Diabetes Paternal Grandmother    History  Substance Use Topics  . Smoking status: Current Every Day Smoker -- 0.30 packs/day    Types: Cigarettes  . Smokeless tobacco: Never Used     Comment: wearing patches 5-7 CIGARETTES PER DAY(OUT OF PATCHES)  . Alcohol Use: 0.0 oz/week    0 Not specified per week     Comment: occ beer   OB History    Gravida Para Term Preterm AB TAB SAB Ectopic Multiple Living   6 5 5  0 0 0 0 0 0 5     Review of Systems  Constitutional: Negative for fever and chills.  Respiratory: Negative for shortness of breath.   Cardiovascular: Negative for chest pain.  Gastrointestinal: Negative for nausea, vomiting, diarrhea and constipation.  Genitourinary: Negative for dysuria.  Musculoskeletal: Positive for arthralgias.  All other systems reviewed and are negative.  Allergies  Review of patient's allergies indicates no known allergies.  Home Medications   Prior to Admission medications   Medication Sig Start Date End Date Taking? Authorizing Provider  albuterol (PROVENTIL HFA;VENTOLIN HFA) 108 (90 BASE) MCG/ACT inhaler Inhale 2 puffs into the lungs every 6 (six) hours as needed for wheezing. For shortness of breath 03/01/14  Yes Ejiroghene E Emokpae, MD  cetirizine (ZYRTEC) 10 MG tablet Take 1 tablet (10 mg total) by mouth daily. 09/13/14  Yes Ejiroghene Arlyce Dice, MD  diphenhydrAMINE (BENADRYL) 25 MG tablet Take 1 tablet (25 mg total) by mouth every 6 (six) hours. 07/03/14  Yes Viona Gilmore Cartner, PA-C  PATADAY  0.2 % SOLN INSTILL 1 DROP TO EYE DAILY. 08/16/14  Yes Ejiroghene E Emokpae, MD  warfarin (COUMADIN) 5 MG tablet Take 3 tablets on Mondays/Wednesdays/Fridays/Saturdays; Take 2&1/2 tablets all other days. Patient taking differently: Take 12.5-15 mg by mouth See admin instructions. Take 3 tablets on Mondays/Wednesdays/Fridays/Saturdays; Take 2&1/2 tablets all other days. 08/01/14  Yes Bartholomew Crews, MD  bacitracin-polymyxin b (POLYSPORIN) ophthalmic ointment Place 1 application into both eyes every 12 (twelve) hours. apply to eye every 12 hours while awake Patient not taking: Reported on 10/25/2014 07/03/14   Viona Gilmore Cartner, PA-C  methocarbamol (ROBAXIN) 500 MG tablet Take 2 tablets (1,000 mg total) by mouth 4 (four) times daily. Patient not taking: Reported on 10/25/2014 08/10/14   Carlisle Cater, PA-C  oxyCODONE-acetaminophen (PERCOCET/ROXICET) 5-325 MG per tablet 1 to 2 tabs PO q6hrs  PRN for pain Patient not taking: Reported on 10/25/2014 09/15/14   Elmyra Ricks Pisciotta, PA-C  pantoprazole (PROTONIX) 20 MG tablet Take 1 tablet (20 mg total) by mouth daily. 09/14/13 09/15/14  Otho Bellows, MD  traMADol (ULTRAM) 50 MG tablet Take 1 tablet (50 mg total) by mouth every 6 (six) hours as needed. Patient not taking: Reported on 10/25/2014 08/10/14   Carlisle Cater, PA-C   Triage Vitals: BP 140/105 mmHg  Pulse 85  Temp(Src) 98.4 F (36.9 C) (Oral)  Resp 18  SpO2 100%  LMP 10/17/2014   Physical Exam  Constitutional: She is oriented to person, place, and time. She appears well-developed and well-nourished.  HENT:  Head: Normocephalic.  Eyes: EOM are normal.  Neck: Normal range of motion.  Cardiovascular: Normal rate and intact distal pulses.   Intact distal pulses with brisk capillary refill  Pulmonary/Chest: Effort normal.  Abdominal: She exhibits no distension.  Musculoskeletal: Normal range of motion.  Left shoulder moderately tender to palpation, no bony abnormality or deformity, range of  motion strength limited secondary to pain  Neurological: She is alert and oriented to person, place, and time.  Skin:  No erythema, abscess, or sign of septic joint  Psychiatric: She has a normal mood and affect.  Nursing note and vitals reviewed.   ED Course  Procedures (including critical care time)  DIAGNOSTIC STUDIES: Oxygen Saturation is 100% on RA, Normal by my interpretation.    COORDINATION OF CARE: 6:41 PM- Will place pt in sling for comfort. Will send home with pain medication. Encouraged follow up with orthopedics. Discussed treatment plan with pt at bedside and pt agreed to plan.     Labs Review Labs Reviewed - No data to display  Imaging Review Dg Shoulder Left  10/25/2014   CLINICAL DATA:  Patient fell onto LEFT shoulder on the ice this morning bend has been fell on top of for, entire LEFT shoulder pain, pain at mid clavicular region through proximal humerus  EXAM: LEFT  SHOULDER - 2+ VIEW  COMPARISON:  03/15/2014  FINDINGS: Osseous mineralization normal.  AC joint alignment normal.  No acute fracture, dislocation or bone destruction.  Visualized LEFT ribs intact.  IMPRESSION: No acute osseous abnormalities.   Electronically Signed   By: Lavonia Dana M.D.   On: 10/25/2014 18:08     EKG Interpretation None      MDM   Final diagnoses:  Fall, initial encounter  Shoulder injury, left, initial encounter    Patient with left shoulder injury. Plain films are negative. No evidence of septic joint or other infection. Will discharge with sling and pain medicine. Patient understands agrees with the plan. She is stable and ready for discharge.  I personally performed the services described in this documentation, which was scribed in my presence. The recorded information has been reviewed and is accurate.    Montine Circle, PA-C 10/25/14 2250  Pamella Pert, MD 10/26/14 1155

## 2014-10-25 NOTE — ED Notes (Signed)
Refused wheelchair 

## 2014-10-25 NOTE — Discharge Instructions (Signed)
Acromioclavicular Injuries °The AC (acromioclavicular) joint is the joint in the shoulder where the collarbone (clavicle) meets the shoulder blade (scapula). The part of the shoulder blade connected to the collarbone is called the acromion. Common problems with and treatments for the AC joint are detailed below. °ARTHRITIS °Arthritis occurs when the joint has been injured and the smooth padding between the joints (cartilage) is lost. This is the wear and tear seen in most joints of the body if they have been overused. This causes the joint to produce pain and swelling which is worse with activity.  °AC JOINT SEPARATION °AC joint separation means that the ligaments connecting the acromion of the shoulder blade and collarbone have been damaged, and the two bones no longer line up. AC separations can be anywhere from mild to severe, and are "graded" depending upon which ligaments are torn and how badly they are torn. °· Grade I Injury: the least damage is done, and the AC joint still lines up. °· Grade II Injury: damage to the ligaments which reinforce the AC joint. In a Grade II injury, these ligaments are stretched but not entirely torn. When stressed, the AC joint becomes painful and unstable. °· Grade III Injury: AC and secondary ligaments are completely torn, and the collarbone is no longer attached to the shoulder blade. This results in deformity; a prominence of the end of the clavicle. °AC JOINT FRACTURE °AC joint fracture means that there has been a break in the bones of the AC joint, usually the end of the clavicle. °TREATMENT °TREATMENT OF AC ARTHRITIS °· There is currently no way to replace the cartilage damaged by arthritis. The best way to improve the condition is to decrease the activities which aggravate the problem. Application of ice to the joint helps decrease pain and soreness (inflammation). The use of non-steroidal anti-inflammatory medication is helpful. °· If less conservative measures do not  work, then cortisone shots (injections) may be used. These are anti-inflammatories; they decrease the soreness in the joint and swelling. °· If non-surgical measures fail, surgery may be recommended. The procedure is generally removal of a portion of the end of the clavicle. This is the part of the collarbone closest to your acromion which is stabilized with ligaments to the acromion of the shoulder blade. This surgery may be performed using a tube-like instrument with a light (arthroscope) for looking into a joint. It may also be performed as an open surgery through a small incision by the surgeon. Most patients will have good range of motion within 6 weeks and may return to all activity including sports by 8-12 weeks, barring complications. °TREATMENT OF AN AC SEPARATION °· The initial treatment is to decrease pain. This is best accomplished by immobilizing the arm in a sling and placing an ice pack to the shoulder for 20 to 30 minutes every 2 hours as needed. As the pain starts to subside, it is important to begin moving the fingers, wrist, elbow and eventually the shoulder in order to prevent a stiff or "frozen" shoulder. Instruction on when and how much to move the shoulder will be provided by your caregiver. The length of time needed to regain full motion and function depends on the amount or grade of the injury. Recovery from a Grade I AC separation usually takes 10 to 14 days, whereas a Grade III may take 6 to 8 weeks. °· Grade I and II separations usually do not require surgery. Even Grade III injuries usually allow return to full   activity with few restrictions. Treatment is also based on the activity demands of the injured shoulder. For example, a high level quarterback with an injured throwing arm will receive more aggressive treatment than someone with a desk job who rarely uses his/her arm for strenuous activities. In some cases, a painful lump may persist which could require a later surgery. Surgery  can be very successful, but the benefits must be weighed against the potential risks. TREATMENT OF AN AC JOINT FRACTURE Fracture treatment depends on the type of fracture. Sometimes a splint or sling may be all that is required. Other times surgery may be required for repair. This is more frequently the case when the ligaments supporting the clavicle are completely torn. Your caregiver will help you with these decisions and together you can decide what will be the best treatment. HOME CARE INSTRUCTIONS   Apply ice to the injury for 15-20 minutes each hour while awake for 2 days. Put the ice in a plastic bag and place a towel between the bag of ice and skin.  If a sling has been applied, wear it constantly for as long as directed by your caregiver, even at night. The sling or splint can be removed for bathing or showering or as directed. Be sure to keep the shoulder in the same place as when the sling is on. Do not lift the arm.  If a figure-of-eight splint has been applied it should be tightened gently by another person every day. Tighten it enough to keep the shoulders held back. Allow enough room to place the index finger between the body and strap. Loosen the splint immediately if there is numbness or tingling in the hands.  Take over-the-counter or prescription medicines for pain, discomfort or fever as directed by your caregiver.  If you or your child has received a follow up appointment, it is very important to keep that appointment in order to avoid long term complications, chronic pain or disability. SEEK MEDICAL CARE IF:   The pain is not relieved with medications.  There is increased swelling or discoloration that continues to get worse rather than better.  You or your child has been unable to follow up as instructed.  There is progressive numbness and tingling in the arm, forearm or hand. SEEK IMMEDIATE MEDICAL CARE IF:   The arm is numb, cold or pale.  There is increasing pain  in the hand, forearm or fingers. MAKE SURE YOU:   Understand these instructions.  Will watch your condition.  Will get help right away if you are not doing well or get worse. Document Released: 06/26/2005 Document Revised: 12/09/2011 Document Reviewed: 12/19/2008 [facility] Patient Information 2015 [place], [place]. This information is not intended to replace advice given to you by your health care provider. Make sure you discuss any questions you have with your health care provider. Shoulder Pain The shoulder is the joint that connects your arms to your body. The bones that form the shoulder joint include the upper arm bone (humerus), the shoulder blade (scapula), and the collarbone (clavicle). The top of the humerus is shaped like a ball and fits into a rather flat socket on the scapula (glenoid cavity). A combination of muscles and strong, fibrous tissues that connect muscles to bones (tendons) support your shoulder joint and hold the ball in the socket. Small, fluid-filled sacs (bursae) are located in different areas of the joint. They act as cushions between the bones and the overlying soft tissues and help reduce friction between  the gliding tendons and the bone as you move your arm. Your shoulder joint allows a wide range of motion in your arm. This range of motion allows you to do things like scratch your back or throw a ball. However, this range of motion also makes your shoulder more prone to pain from overuse and injury. Causes of shoulder pain can originate from both injury and overuse and usually can be grouped in the following four categories:  Redness, swelling, and pain (inflammation) of the tendon (tendinitis) or the bursae (bursitis).  Instability, such as a dislocation of the joint.  Inflammation of the joint (arthritis).  Broken bone (fracture). HOME CARE INSTRUCTIONS   Apply ice to the sore area.  Put ice in a plastic bag.  Place a towel between your skin and the  bag.  Leave the ice on for 15-20 minutes, 3-4 times per day for the first 2 days, or as directed by your health care provider.  Stop using cold packs if they do not help with the pain.  If you have a shoulder sling or immobilizer, wear it as long as your caregiver instructs. Only remove it to shower or bathe. Move your arm as little as possible, but keep your hand moving to prevent swelling.  Squeeze a soft ball or foam pad as much as possible to help prevent swelling.  Only take over-the-counter or prescription medicines for pain, discomfort, or fever as directed by your caregiver. SEEK MEDICAL CARE IF:   Your shoulder pain increases, or new pain develops in your arm, hand, or fingers.  Your hand or fingers become cold and numb.  Your pain is not relieved with medicines. SEEK IMMEDIATE MEDICAL CARE IF:   Your arm, hand, or fingers are numb or tingling.  Your arm, hand, or fingers are significantly swollen or turn white or blue. MAKE SURE YOU:   Understand these instructions.  Will watch your condition.  Will get help right away if you are not doing well or get worse. Document Released: 06/26/2005 Document Revised: 01/31/2014 Document Reviewed: 08/31/2011 [facility] Patient Information 2015 [place], [place]. This information is not intended to replace advice given to you by your health care provider. Make sure you discuss any questions you have with your health care provider.

## 2014-11-02 ENCOUNTER — Ambulatory Visit (INDEPENDENT_AMBULATORY_CARE_PROVIDER_SITE_OTHER): Payer: Medicaid Other | Admitting: Obstetrics & Gynecology

## 2014-11-02 ENCOUNTER — Encounter: Payer: Self-pay | Admitting: Obstetrics & Gynecology

## 2014-11-02 VITALS — BP 129/86 | HR 75 | Temp 98.5°F | Resp 20 | Ht 64.5 in | Wt 210.5 lb

## 2014-11-02 NOTE — Progress Notes (Signed)
Pt states she had a period in September 2015, then did not have a period in October, November or December. She was referred to [place] in December 2015.  Since then, she has had a period on 10/17/14.  Nursing assessment completed at 1555 and pt was sent to lobby to wait for available exam room. She stated that she has another appt today @ 1630 which she cannot miss. I advised pt that she may want to reschedule today's appt because it is not likely that she will be finished in time to get to next appt.  Pt agreed and went to front window to reschedule appt.

## 2014-11-16 ENCOUNTER — Other Ambulatory Visit: Payer: Self-pay | Admitting: Internal Medicine

## 2014-11-28 ENCOUNTER — Ambulatory Visit: Payer: Medicaid Other

## 2014-12-01 ENCOUNTER — Telehealth: Payer: Self-pay | Admitting: Pharmacist

## 2014-12-01 NOTE — Telephone Encounter (Signed)
All to patient to confirm appointment for 12/05/14 at 10:00 phone number is disconnected

## 2014-12-05 ENCOUNTER — Ambulatory Visit (INDEPENDENT_AMBULATORY_CARE_PROVIDER_SITE_OTHER): Payer: Medicaid Other | Admitting: Pharmacist

## 2014-12-05 DIAGNOSIS — D6859 Other primary thrombophilia: Secondary | ICD-10-CM

## 2014-12-05 DIAGNOSIS — D6852 Prothrombin gene mutation: Secondary | ICD-10-CM

## 2014-12-05 DIAGNOSIS — Z7901 Long term (current) use of anticoagulants: Secondary | ICD-10-CM

## 2014-12-05 LAB — POCT INR: INR: 2

## 2014-12-05 MED ORDER — WARFARIN SODIUM 5 MG PO TABS: ORAL_TABLET | ORAL | Status: DC

## 2014-12-05 NOTE — Progress Notes (Signed)
Anti-Coagulation Progress Note  Michele Gillespie is a 39 y.o. female who is currently on an anti-coagulation regimen.    RECENT RESULTS: Recent results are below, the most recent result is correlated with a dose of 97.5 mg. 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] Long term current use of anticoagulant [Z79.01]        Comments         ANTICOAG TODAY: Anticoagulation Summary as of 12/05/2014    INR goal 2.0-3.0  Selected INR 2.00 (12/05/2014)  Next INR check 01/02/2015  Target end date Indefinite   Indications  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  Patient instructed to take today's dose.  Patient verbalized understanding of these instructions.       FOLLOW-UP Return in 4 weeks (on 01/02/2015) for Follow up INR at 1000h.  [name], III Pharm.D., CACP

## 2014-12-05 NOTE — Patient Instructions (Signed)
Patient instructed to take medications as defined in the Anti-coagulation Track section of this encounter.  Patient instructed to take today's dose.  Patient verbalized understanding of these instructions.    

## 2014-12-09 ENCOUNTER — Ambulatory Visit (INDEPENDENT_AMBULATORY_CARE_PROVIDER_SITE_OTHER): Payer: Medicaid Other | Admitting: Obstetrics & Gynecology

## 2014-12-09 ENCOUNTER — Encounter: Payer: Self-pay | Admitting: Obstetrics & Gynecology

## 2014-12-09 VITALS — BP 130/90 | HR 86 | Ht 64.5 in | Wt 206.0 lb

## 2014-12-09 DIAGNOSIS — N915 Oligomenorrhea, unspecified: Secondary | ICD-10-CM

## 2014-12-09 LAB — TSH: TSH: 0.729 u[IU]/mL (ref 0.350–4.500)

## 2014-12-09 LAB — POCT PREGNANCY, URINE: PREG TEST UR: NEGATIVE

## 2014-12-09 NOTE — Progress Notes (Signed)
Subjective:     Patient ID: Michele Gillespie, female   DOB: 10/19/1975, 39 y.o.   MRN: [id number]  HPI Pt with LMP in Sept 2015.  No cycles Sept- Jan  In Jan 2016 pt had menses that was heavy.  Next cyle was Feb. No cycles yet in Mar.  Pt reports hot flushes when her cycle was absent.  Pt s/p BTL 18 years prev.  Pt reports mood changes when her cycles wee absent.  Now improved.    Review of Systems     Objective:   Physical Exam BP 130/90 mmHg  Pulse 86  Ht 5' 4.5" (1.638 m)  Wt 206 lb (93.441 kg)  BMI 34.83 kg/m2  LMP 11/20/2014  Exam deferred  UPT- neg    Assessment:     Oligomenorrhea- possibly due to perimenopause.  Cycles returned the past 2 months     Plan:     Labs: UPT, [place] and TSH Follow up in 6 months with menstrual calendar

## 2014-12-09 NOTE — Progress Notes (Signed)
Pt had no period for 3 months, then had a period in Jan and Feb. Had two periods in February.  Pt is on coumadin.

## 2014-12-09 NOTE — Patient Instructions (Signed)

## 2014-12-10 LAB — FOLLICLE STIMULATING HORMONE: FSH: 42.5 m[IU]/mL

## 2014-12-30 ENCOUNTER — Telehealth: Payer: Self-pay | Admitting: Pharmacist

## 2014-12-30 NOTE — Telephone Encounter (Signed)
Call to patient to confirm appointment for 01/02/15 at 10:00 phone is disconnected

## 2015-01-02 ENCOUNTER — Ambulatory Visit: Payer: Medicaid Other

## 2015-01-09 ENCOUNTER — Ambulatory Visit (INDEPENDENT_AMBULATORY_CARE_PROVIDER_SITE_OTHER): Payer: Medicaid Other | Admitting: Pharmacist

## 2015-01-09 DIAGNOSIS — D6852 Prothrombin gene mutation: Secondary | ICD-10-CM

## 2015-01-09 DIAGNOSIS — Z7901 Long term (current) use of anticoagulants: Secondary | ICD-10-CM | POA: Diagnosis not present

## 2015-01-09 DIAGNOSIS — D6859 Other primary thrombophilia: Secondary | ICD-10-CM

## 2015-01-09 LAB — POCT INR: INR: 1.3

## 2015-01-09 NOTE — Patient Instructions (Signed)
Patient instructed to take medications as defined in the Anti-coagulation Track section of this encounter.  Patient instructed to take today's dose.  Patient verbalized understanding of these instructions.    

## 2015-01-09 NOTE — Progress Notes (Signed)
Anti-Coagulation Progress Note  Michele Gillespie is a 39 y.o. female who is currently on an anti-coagulation regimen.    RECENT RESULTS: Recent results are below, the most recent result is correlated with a dose of 97.5 mg. per week: Lab Results  Component Value Date   INR 1.30 01/09/2015   INR 2.00 12/05/2014   INR 2.1 09/26/2014    ANTI-COAG DOSE: Anticoagulation Dose Instructions as of 01/09/2015      [name] Tue Wed Thu Fri Sat   New Dose 15 mg 15 mg 15 mg 15 mg 15 mg 15 mg 15 mg       ANTICOAG SUMMARY: Anticoagulation Episode Summary    Current INR goal 2.0-3.0  Next INR check 01/23/2015  INR from last check 1.30! 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  Patient instructed to take today's dose.  Patient verbalized understanding of these instructions.       FOLLOW-UP Return in 2 weeks (on 01/23/2015) for Follow up INR at 1015h.  [name], III Pharm.D., CACP

## 2015-01-15 ENCOUNTER — Emergency Department (HOSPITAL_COMMUNITY)
Admission: EM | Admit: 2015-01-15 | Discharge: 2015-01-16 | Disposition: A | Discharge status: Home / Self Care | Payer: Medicaid Other | Attending: Emergency Medicine | Admitting: Emergency Medicine

## 2015-01-15 ENCOUNTER — Encounter (HOSPITAL_COMMUNITY): Payer: Self-pay | Admitting: Emergency Medicine

## 2015-01-15 DIAGNOSIS — J45909 Unspecified asthma, uncomplicated: Secondary | ICD-10-CM | POA: Diagnosis not present

## 2015-01-15 DIAGNOSIS — Z720 Tobacco use: Secondary | ICD-10-CM | POA: Diagnosis not present

## 2015-01-15 DIAGNOSIS — F329 Major depressive disorder, single episode, unspecified: Secondary | ICD-10-CM | POA: Diagnosis not present

## 2015-01-15 DIAGNOSIS — Z86711 Personal history of pulmonary embolism: Secondary | ICD-10-CM | POA: Insufficient documentation

## 2015-01-15 DIAGNOSIS — Z79899 Other long term (current) drug therapy: Secondary | ICD-10-CM | POA: Diagnosis not present

## 2015-01-15 DIAGNOSIS — Z86718 Personal history of other venous thrombosis and embolism: Secondary | ICD-10-CM | POA: Diagnosis not present

## 2015-01-15 DIAGNOSIS — M542 Cervicalgia: Secondary | ICD-10-CM | POA: Diagnosis present

## 2015-01-15 DIAGNOSIS — Z862 Personal history of diseases of the blood and blood-forming organs and certain disorders involving the immune mechanism: Secondary | ICD-10-CM | POA: Insufficient documentation

## 2015-01-15 DIAGNOSIS — I809 Phlebitis and thrombophlebitis of unspecified site: Secondary | ICD-10-CM

## 2015-01-15 DIAGNOSIS — I808 Phlebitis and thrombophlebitis of other sites: Secondary | ICD-10-CM | POA: Insufficient documentation

## 2015-01-15 MED ORDER — OXYCODONE-ACETAMINOPHEN 5-325 MG PO TABS
2.0000 | ORAL_TABLET | Freq: Once | ORAL | Status: AC
  Administered 2015-01-15: 2 via ORAL
  Filled 2015-01-15: qty 2

## 2015-01-15 MED ORDER — IBUPROFEN 400 MG PO TABS
600.0000 mg | ORAL_TABLET | Freq: Once | ORAL | Status: AC
  Administered 2015-01-15: 600 mg via ORAL
  Filled 2015-01-15 (×2): qty 1

## 2015-01-15 NOTE — ED Provider Notes (Signed)
CSN: [id number]     Arrival date & time 01/15/15  2138 History   First MD Initiated Contact with Patient 01/15/15 2203     Chief Complaint  Patient presents with  . Neck Pain     (Consider location/radiation/quality/duration/timing/severity/associated sxs/prior Treatment) HPI   39 year old female with right neck pain & " knots." Onset Thursday. Persistent since. Patient concerned for possible blood clot. Denies any trauma. No fevers or chills. No sore throat. Pain is worse with palpation area. No 7 change with range of motion of her neck. No rash. No drainage.  Past Medical History  Diagnosis Date  . Pulmonary embolism  September 07, 2005     her CT angiogram - positive for pulmonary emboli to several branches of the right lower lobe- relatively small clot burden, clear lung; patient started on Coumadin; CT angiogram on January 10, 2006 showed resolution of previously seen pulmonary emboli with minimal basilar atelectasis  . Lung nodule  June 10, 2008     stable tiny noduke noted along the minor fissure of the right lung on CT angio September 11, 09 -  stable for 2 years and consistent with benign disease  . Arm DVT (deep venous thromboembolism), acute  September 23, 2009, January 05, 2010     Doppler study significant with indeterminant age DVT involving the left upper extremity, Doppler performed January 05, 2010 consistent with acute DVT involving the left upper extremity  . Asthma   . Anemia 2007     microcytic anemia, baseline hemoglobin 10-11, MCV at baseline 72-77, secondary to iron deficiency  . Schizophrenia   . Depression   . Leukopenia 2008     unclear etiology baseline WBC  2.8-3.7   Past Surgical History  Procedure Laterality Date  . Cesarean section       History of 5 C-section  . Tubal ligation    . Exploratory laparotomy with abdominal mass excision  02/2005   Family History  Problem Relation Age of Onset  . Hypertension Mother   . Heart disease Mother   . Diabetes  Father   . Birth defects Maternal Aunt   . Birth defects Maternal Uncle   . Diabetes Paternal Grandmother    History  Substance Use Topics  . Smoking status: Current Every Day Smoker -- 0.30 packs/day for 0 years    Types: Cigarettes  . Smokeless tobacco: Never Used  . Alcohol Use: Yes   OB History    Gravida Para Term Preterm AB TAB SAB Ectopic Multiple Living   5 4 4  0 0 0 0 0 0 5     Review of Systems  All systems reviewed and negative, other than as noted in HPI.   Allergies  Review of patient's allergies indicates no known allergies.  Home Medications   Prior to Admission medications   Medication Sig Start Date End Date Taking? Authorizing Provider  albuterol (PROVENTIL HFA;VENTOLIN HFA) 108 (90 BASE) MCG/ACT inhaler Inhale 2 puffs into the lungs every 6 (six) hours as needed for wheezing. For shortness of breath 03/01/14   Ejiroghene Arlyce Dice, MD  bacitracin-polymyxin b (POLYSPORIN) ophthalmic ointment Place 1 application into both eyes every 12 (twelve) hours. apply to eye every 12 hours while awake 07/03/14   Comer Locket, PA-C  cetirizine (ZYRTEC) 10 MG tablet Take 1 tablet (10 mg total) by mouth daily. 09/13/14   Ejiroghene Arlyce Dice, MD  diphenhydrAMINE (BENADRYL) 25 MG tablet Take 1 tablet (25 mg total) by mouth every 6 (six)  hours. 07/03/14   Comer Locket, PA-C  methocarbamol (ROBAXIN) 500 MG tablet Take 2 tablets (1,000 mg total) by mouth 4 (four) times daily. 08/10/14   Carlisle Cater, PA-C  oxyCODONE-acetaminophen (PERCOCET/ROXICET) 5-325 MG per tablet Take 2 tablets by mouth every 6 (six) hours as needed for severe pain. 10/25/14   Montine Circle, PA-C  pantoprazole (PROTONIX) 20 MG tablet Take 1 tablet (20 mg total) by mouth daily. 09/14/13 09/15/14  Otho Bellows, MD  PATADAY 0.2 % SOLN INSTILL 1 DROP TO EYE DAILY. 08/16/14   Ejiroghene Arlyce Dice, MD  traMADol (ULTRAM) 50 MG tablet Take 1 tablet (50 mg total) by mouth every 6 (six) hours as needed.  08/10/14   Carlisle Cater, PA-C  warfarin (COUMADIN) 5 MG tablet Take 3 tablets on Mondays/Wednesdays/Fridays; 2&1/2 tablets all other days. 12/05/14   Madilyn Fireman, MD   BP 141/92 mmHg  Pulse 77  Temp(Src) 98.2 F (36.8 C) (Oral)  Resp 18  Ht 5\' 4"  (1.626 m)  Wt 215 lb (97.523 kg)  BMI 36.89 kg/m2  SpO2 100%  LMP 01/11/2015 Physical Exam  Constitutional: She appears well-developed and well-nourished. No distress.  HENT:  Head: Normocephalic and atraumatic.    Tender, ropy small masses just posterior to right external jugular vein. She is most consistent with a superficial phlebitis. No overlying redness.  Eyes: Conjunctivae are normal. Right eye exhibits no discharge. Left eye exhibits no discharge.  Neck: Neck supple.  Cardiovascular: Normal rate, regular rhythm and normal heart sounds.  Exam reveals no gallop and no friction rub.   No murmur heard. Pulmonary/Chest: Effort normal and breath sounds normal. No respiratory distress.  Abdominal: Soft. She exhibits no distension. There is no tenderness.  Musculoskeletal: She exhibits no edema or tenderness.  Neurological: She is alert.  Skin: Skin is warm and dry.  Psychiatric: She has a normal mood and affect. Her behavior is normal. Thought content normal.  Nursing note and vitals reviewed.   ED Course  Procedures (including critical care time) Labs Review Labs Reviewed  PROTIME-INR - Abnormal; Notable for the following:    Prothrombin Time 19.3 (*)    INR 1.61 (*)    All other components within normal limits    Imaging Review No results found.   EKG Interpretation None      MDM   Final diagnoses:  Superficial phlebitis    39yf with r neck pain. Suspect she may have phlebitis of superficial neck vein. Tender ropy masses a little bit posterior to R EJ. No increased warmth. No redness. On coumadin for hx of PE. Do not feel consistent with adenopathy. Plan symptomatic tx.     Virgel Manifold, MD 01/17/15 2017

## 2015-01-15 NOTE — ED Notes (Signed)
C/o painful "knots" on R side of neck since Thursday.  Pt concerned that it may be a blood clot.

## 2015-01-16 LAB — PROTIME-INR
INR: 1.61 — ABNORMAL HIGH (ref 0.00–1.49)
Prothrombin Time: 19.3 seconds — ABNORMAL HIGH (ref 11.6–15.2)

## 2015-01-16 MED ORDER — TRAMADOL HCL 50 MG PO TABS: 50.0000 mg | ORAL_TABLET | Freq: Four times a day (QID) | ORAL | Status: DC | PRN

## 2015-01-16 NOTE — Discharge Instructions (Signed)
Take 4 pills (20mg ) of coumadin for the next two days and then resume previous dosing.    Phlebitis Phlebitis is soreness and swelling (inflammation) of a vein. This can occur in your arms, legs, or torso (trunk), as well as deeper inside your body. Phlebitis is usually not serious when it occurs close to the surface of the body. However, it can cause serious problems when it occurs in a vein deeper inside the body. CAUSES  Phlebitis can be triggered by various things, including:   Reduced blood flow through your veins. This can happen with:  Bed rest over a long period.  Long-distance travel.  Injury.  Surgery.  Being overweight (obese) or pregnant.  Having an IV tube put in the vein and getting certain medicines through the vein.  Cancer and cancer treatment.  Use of illegal drugs taken through the vein.  Inflammatory diseases.  Inherited (genetic) diseases that increase the risk of blood clots.  Hormone therapy, such as birth control pills. SIGNS AND SYMPTOMS   Red, tender, swollen, and painful area on your skin. Usually, the area will be long and narrow.  Firmness along the center of the affected area. This can indicate that a blood clot has formed.  Low-grade fever. DIAGNOSIS  A health care provider can usually diagnose phlebitis by examining the affected area and asking about your symptoms. To check for infection or blood clots, your health care provider may order blood tests or an ultrasound exam of the area. Blood tests and your family history may also indicate if you have an underlying genetic disease that causes blood clots. Occasionally, a piece of tissue is taken from the body (biopsy sample) if an unusual cause of phlebitis is suspected. TREATMENT  Treatment will vary depending on the severity of the condition and the area of the body affected. Treatment may include:  Use of a warm compress or heating pad.  Use of compression stockings or  bandages.  Anti-inflammatory medicines.  Removal of any IV tube that may be causing the problem.  Medicines that kill germs (antibiotics) if an infection is present.  Blood-thinning medicines if a blood clot is suspected or present.  In rare cases, surgery may be needed to remove damaged sections of vein. HOME CARE INSTRUCTIONS   Only take over-the-counter or prescription medicines as directed by your health care provider. Take all medicines exactly as prescribed.  Raise (elevate) the affected area above the level of your heart as directed by your health care provider.  Apply a warm compress or heating pad to the affected area as directed by your health care provider. Do not sleep with the heating pad.  Use compression stockings or bandages as directed. These will speed healing and prevent the condition from coming back.  If you are on blood thinners:  Get follow-up blood tests as directed by your health care provider.  Check with your health care provider before using any new medicines.  Carry a medical alert card or wear your medical alert jewelry to show that you are on blood thinners.  For phlebitis in the legs:  Avoid prolonged standing or bed rest.  Keep your legs moving. Raise your legs when sitting or lying.  Do not smoke.  Women, particularly those over the age of 5, should consider the risks and benefits of taking the contraceptive pill. This kind of hormone treatment can increase your risk for blood clots.  Follow up with your health care provider as directed. SEEK MEDICAL CARE IF:  You have unusual bruising or any bleeding problems.  Your swelling or pain in the affected area is not improving.  You are on anti-inflammatory medicine, and you develop belly (abdominal) pain. SEEK IMMEDIATE MEDICAL CARE IF:   You have a sudden onset of chest pain or difficulty breathing.  You have a fever or persistent symptoms for more than 2-3 days.  You have a fever  and your symptoms suddenly get worse. MAKE SURE YOU:  Understand these instructions.  Will watch your condition.  Will get help right away if you are not doing well or get worse. Document Released: 09/10/2001 Document Revised: 07/07/2013 Document Reviewed: 05/24/2013 [facility] Patient Information 2015 [place], [place]. This information is not intended to replace advice given to you by your health care provider. Make sure you discuss any questions you have with your health care provider.

## 2015-01-20 ENCOUNTER — Other Ambulatory Visit: Payer: Self-pay | Admitting: Internal Medicine

## 2015-01-23 ENCOUNTER — Ambulatory Visit (INDEPENDENT_AMBULATORY_CARE_PROVIDER_SITE_OTHER): Payer: Medicaid Other | Admitting: Pharmacist

## 2015-01-23 DIAGNOSIS — D6852 Prothrombin gene mutation: Secondary | ICD-10-CM

## 2015-01-23 DIAGNOSIS — Z7901 Long term (current) use of anticoagulants: Secondary | ICD-10-CM

## 2015-01-23 DIAGNOSIS — D6859 Other primary thrombophilia: Secondary | ICD-10-CM

## 2015-01-23 LAB — POCT INR: INR: 1.5

## 2015-01-23 MED ORDER — WARFARIN SODIUM 5 MG PO TABS: ORAL_TABLET | ORAL | Status: DC

## 2015-01-23 NOTE — Progress Notes (Signed)
Anti-Coagulation Progress Note  Michele Gillespie is a 39 y.o. female who is currently on an anti-coagulation regimen.    RECENT RESULTS: Recent results are below, the most recent result is correlated with a dose of 140 mg. per week: Lab Results  Component Value Date   INR 1.5 01/23/2015   INR 1.61* 01/15/2015   INR 1.30 01/09/2015    ANTI-COAG DOSE: Anticoagulation Dose Instructions as of 01/23/2015      [name] Tue Wed Thu Fri Sat   New Dose 20 mg 20 mg 20 mg 20 mg 20 mg 20 mg 20 mg       ANTICOAG SUMMARY: Anticoagulation Episode Summary    Current INR goal 2.0-3.0  Next INR check 02/06/2015  INR from last check 1.5! 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 (01/23/2015)  Next INR check 02/06/2015  Target end date Indefinite   Indications  [place] [D68.52] Long term current use of anticoagulant [Z79.01]      Anticoagulation Episode Summary    [facility]   Preferred lab    Send INR reminders to    Comments       PATIENT INSTRUCTIONS: Patient Instructions  Patient instructed to take medications as defined in the Anti-coagulation Track section of this encounter.  Patient instructed to take today's dose.  Patient verbalized understanding of these instructions.       FOLLOW-UP Return in 2 weeks (on 02/06/2015) for Follow up INR at 1015h.  [name], III Pharm.D., CACP

## 2015-01-23 NOTE — Patient Instructions (Signed)
Patient instructed to take medications as defined in the Anti-coagulation Track section of this encounter.  Patient instructed to take today's dose.  Patient verbalized understanding of these instructions.    

## 2015-01-23 NOTE — Progress Notes (Signed)
Indication: Recurrent venous thrombosis. Duration: Lifelong. INR: Below target. Agree with Dr. Gladstone Pih assessment and plan.

## 2015-01-24 ENCOUNTER — Other Ambulatory Visit: Payer: Self-pay | Admitting: *Deleted

## 2015-01-24 NOTE — Telephone Encounter (Signed)
A user error has taken place: encounter opened in error, closed for administrative reasons.

## 2015-02-06 ENCOUNTER — Ambulatory Visit (INDEPENDENT_AMBULATORY_CARE_PROVIDER_SITE_OTHER): Payer: Medicaid Other | Admitting: Pharmacist

## 2015-02-06 DIAGNOSIS — D6852 Prothrombin gene mutation: Secondary | ICD-10-CM | POA: Diagnosis not present

## 2015-02-06 DIAGNOSIS — Z7901 Long term (current) use of anticoagulants: Secondary | ICD-10-CM

## 2015-02-06 DIAGNOSIS — D6859 Other primary thrombophilia: Secondary | ICD-10-CM

## 2015-02-06 LAB — POCT INR: INR: 1.9

## 2015-02-06 NOTE — Patient Instructions (Signed)
Patient instructed to take medications as defined in the Anti-coagulation Track section of this encounter.  Patient instructed to take today's dose.  Patient verbalized understanding of these instructions.    

## 2015-02-06 NOTE — Progress Notes (Signed)
INTERNAL MEDICINE TEACHING ATTENDING ADDENDUM - Aldine Contes M.D  Duration- indefinite, Indication- PE, DVT, hypercoaguable state, INR- subtherapeutic. Agree with pharmacy recommendations as outlined in their note.

## 2015-02-06 NOTE — Progress Notes (Signed)
Anti-Coagulation Progress Note  Michele Gillespie is a 39 y.o. female who is currently on an anti-coagulation regimen.    RECENT RESULTS: Recent results are below, the most recent result is correlated with a dose of 240 mg. per week: Lab Results  Component Value Date   INR 1.90 02/06/2015   INR 1.5 01/23/2015   INR 1.61* 01/15/2015    ANTI-COAG DOSE: Anticoagulation Dose Instructions as of 02/06/2015      [name] Tue Wed Thu Fri Sat   New Dose 20 mg 25 mg 20 mg 25 mg 20 mg 25 mg 20 mg       ANTICOAG SUMMARY: Anticoagulation Episode Summary    Current INR goal 2.0-3.0  Next INR check 02/13/2015  INR from last check 1.90! 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  Patient instructed to take today's dose.  Patient verbalized understanding of these instructions.       FOLLOW-UP Return in 7 days (on 02/13/2015) for Follow up INR at 1030h.  [name], III Pharm.D., CACP

## 2015-02-13 ENCOUNTER — Ambulatory Visit (INDEPENDENT_AMBULATORY_CARE_PROVIDER_SITE_OTHER): Payer: Medicaid Other | Admitting: Pharmacist

## 2015-02-13 DIAGNOSIS — D6852 Prothrombin gene mutation: Secondary | ICD-10-CM | POA: Diagnosis not present

## 2015-02-13 DIAGNOSIS — D6859 Other primary thrombophilia: Secondary | ICD-10-CM

## 2015-02-13 DIAGNOSIS — Z7901 Long term (current) use of anticoagulants: Secondary | ICD-10-CM | POA: Diagnosis not present

## 2015-02-13 LAB — POCT INR: INR: 5.6

## 2015-02-13 NOTE — Progress Notes (Signed)
Anti-Coagulation Progress Note  Michele Gillespie is a 39 y.o. female who is currently on an anti-coagulation regimen.    RECENT RESULTS: Recent results are below, the most recent result is correlated with a dose of 155 mg. per week: Will OMIT today's dose, resume dosing as per instructions below for 2 days; INR on Thursday 19-MAY-16 at 0945h. Lab Results  Component Value Date   INR 5.60 02/13/2015   INR 1.90 02/06/2015   INR 1.5 01/23/2015    ANTI-COAG DOSE: Anticoagulation Dose Instructions as of 02/13/2015      [name] Tue Wed Thu Fri Sat   New Dose 0 mg Hold 20 mg 25 mg 0 mg 0 mg 0 mg       ANTICOAG SUMMARY: Anticoagulation Episode Summary    Current INR goal 2.0-3.0  Next INR check 02/16/2015  INR from last check 5.60! 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  Patient instructed to OMIT today's dose.  Patient verbalized understanding of these instructions.       FOLLOW-UP Return in 3 days (on 02/16/2015) for Follow up INR at 0945h.  [name], III Pharm.D., CACP

## 2015-02-13 NOTE — Patient Instructions (Signed)
Patient instructed to take medications as defined in the Anti-coagulation Track section of this encounter.  Patient instructed to OMIT today's dose.  Patient verbalized understanding of these instructions.    

## 2015-02-14 NOTE — Progress Notes (Signed)
I have reviewed Dr. groce's note.  [name] is on anticoagulation for a reported hypercoagulable state and thrombosis at least twice.  Her INR was elevated. Close follow up arranged.

## 2015-02-16 ENCOUNTER — Ambulatory Visit: Payer: Medicaid Other

## 2015-02-23 ENCOUNTER — Ambulatory Visit (INDEPENDENT_AMBULATORY_CARE_PROVIDER_SITE_OTHER): Payer: Medicaid Other | Admitting: Pharmacist

## 2015-02-23 DIAGNOSIS — D6859 Other primary thrombophilia: Secondary | ICD-10-CM

## 2015-02-23 DIAGNOSIS — D6852 Prothrombin gene mutation: Secondary | ICD-10-CM | POA: Diagnosis not present

## 2015-02-23 DIAGNOSIS — Z7901 Long term (current) use of anticoagulants: Secondary | ICD-10-CM | POA: Diagnosis not present

## 2015-02-23 LAB — POCT INR: INR: 4.7

## 2015-02-23 NOTE — Progress Notes (Signed)
Anti-Coagulation Progress Note  EDAN SERRATORE is a 39 y.o. female who is currently on an anti-coagulation regimen.    RECENT RESULTS: Recent results are below, the most recent result is correlated with a dose of 140 mg. per week:  Will decrease to 120mg /wk.  Lab Results  Component Value Date   INR 4.70 02/23/2015   INR 5.60 02/13/2015   INR 1.90 02/06/2015    ANTI-COAG DOSE: Anticoagulation Dose Instructions as of 02/23/2015      [name] Tue Wed Thu Fri Sat   New Dose 20 mg 15 mg 20 mg 20 mg 15 mg 20 mg 15 mg       ANTICOAG SUMMARY: Anticoagulation Episode Summary    Current INR goal 2.0-3.0  Next INR check 03/06/2015  INR from last check 4.70! 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  Patient instructed to take today's dose.  Patient verbalized understanding of these instructions.       FOLLOW-UP Return in 11 days (on 03/06/2015) for Follow up INR at 1045h.  [name], III Pharm.D., CACP

## 2015-02-23 NOTE — Patient Instructions (Signed)
Patient instructed to take medications as defined in the Anti-coagulation Track section of this encounter.  Patient instructed to take today's dose.  Patient verbalized understanding of these instructions.    

## 2015-03-06 ENCOUNTER — Ambulatory Visit (INDEPENDENT_AMBULATORY_CARE_PROVIDER_SITE_OTHER): Payer: Medicaid Other | Admitting: Pharmacist

## 2015-03-06 DIAGNOSIS — Z7901 Long term (current) use of anticoagulants: Secondary | ICD-10-CM

## 2015-03-06 DIAGNOSIS — D6852 Prothrombin gene mutation: Secondary | ICD-10-CM | POA: Diagnosis not present

## 2015-03-06 DIAGNOSIS — D6859 Other primary thrombophilia: Secondary | ICD-10-CM

## 2015-03-06 LAB — POCT INR: INR: 4.1

## 2015-03-06 NOTE — Progress Notes (Signed)
Anti-Coagulation Progress Note  Michele Gillespie is a 39 y.o. female who is currently on an anti-coagulation regimen.    RECENT RESULTS: Recent results are below, the most recent result is correlated with a dose of 125 mg. per week: Lab Results  Component Value Date   INR 4.10 03/06/2015   INR 4.70 02/23/2015   INR 5.60 02/13/2015    ANTI-COAG DOSE: Anticoagulation Dose Instructions as of 03/06/2015      [name] Tue Wed Thu Fri Sat   New Dose 15 mg 15 mg 15 mg 20 mg 15 mg 15 mg 15 mg       ANTICOAG SUMMARY: Anticoagulation Episode Summary    Current INR goal 2.0-3.0  Next INR check 03/27/2015  INR from last check 4.10! 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  Patient instructed to take today's dose.  Patient verbalized understanding of these instructions.       FOLLOW-UP Return in 3 weeks (on 03/27/2015) for Follow up INR at 1030h.  [name], III Pharm.D., CACP

## 2015-03-06 NOTE — Patient Instructions (Signed)
Patient instructed to take medications as defined in the Anti-coagulation Track section of this encounter.  Patient instructed to take today's dose.  Patient verbalized understanding of these instructions.    

## 2015-03-08 ENCOUNTER — Ambulatory Visit (INDEPENDENT_AMBULATORY_CARE_PROVIDER_SITE_OTHER): Payer: Medicaid Other | Admitting: *Deleted

## 2015-03-08 ENCOUNTER — Other Ambulatory Visit: Payer: Self-pay | Admitting: Internal Medicine

## 2015-03-08 DIAGNOSIS — Z111 Encounter for screening for respiratory tuberculosis: Secondary | ICD-10-CM | POA: Diagnosis present

## 2015-03-24 ENCOUNTER — Telehealth: Payer: Self-pay | Admitting: Pharmacist

## 2015-03-24 NOTE — Telephone Encounter (Signed)
Call to patient to confirm appointment for 03/27/15 at 10:30 mail box does not accept messages

## 2015-03-27 ENCOUNTER — Ambulatory Visit (INDEPENDENT_AMBULATORY_CARE_PROVIDER_SITE_OTHER): Payer: Medicaid Other | Admitting: Pharmacist

## 2015-03-27 DIAGNOSIS — D6852 Prothrombin gene mutation: Secondary | ICD-10-CM | POA: Diagnosis not present

## 2015-03-27 DIAGNOSIS — Z7901 Long term (current) use of anticoagulants: Secondary | ICD-10-CM

## 2015-03-27 DIAGNOSIS — D6859 Other primary thrombophilia: Secondary | ICD-10-CM

## 2015-03-27 LAB — POCT INR: INR: 3.5

## 2015-03-27 NOTE — Progress Notes (Signed)
Anti-Coagulation Progress Note  Michele Gillespie is a 39 y.o. female who is currently on an anti-coagulation regimen.    RECENT RESULTS: Recent results are below, the most recent result is correlated with a dose of 110 mg. per week: Lab Results  Component Value Date   INR 3.50 03/27/2015   INR 4.10 03/06/2015   INR 4.70 02/23/2015    ANTI-COAG DOSE: Anticoagulation Dose Instructions as of 03/27/2015      [name] Tue Wed Thu Fri Sat   New Dose 15 mg 12.5 mg 15 mg 15 mg 12.5 mg 15 mg 15 mg       ANTICOAG SUMMARY: Anticoagulation Episode Summary    Current INR goal 2.0-3.0  Next INR check 04/17/2015  INR from last check 3.50! 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  Patient instructed to take today's dose.  Patient verbalized understanding of these instructions.       FOLLOW-UP Return in 3 weeks (on 04/17/2015) for Follow up INR at 1045h.  [name], III Pharm.D., CACP

## 2015-03-27 NOTE — Patient Instructions (Signed)
Patient instructed to take medications as defined in the Anti-coagulation Track section of this encounter.  Patient instructed to take today's dose.  Patient verbalized understanding of these instructions.    

## 2015-03-28 NOTE — Progress Notes (Signed)
I have reviewed anticoagulation note.  Michele Gillespie is on coumadin for a hypercoagulable state.  INR was elevated at 3.5.  Coumadin dose was decreased with follow up planned.

## 2015-04-10 ENCOUNTER — Other Ambulatory Visit: Payer: Self-pay | Admitting: Internal Medicine

## 2015-04-17 ENCOUNTER — Ambulatory Visit: Payer: Medicaid Other

## 2015-04-17 ENCOUNTER — Ambulatory Visit (INDEPENDENT_AMBULATORY_CARE_PROVIDER_SITE_OTHER): Payer: Medicaid Other | Admitting: Pharmacist

## 2015-04-17 DIAGNOSIS — D6852 Prothrombin gene mutation: Secondary | ICD-10-CM | POA: Diagnosis not present

## 2015-04-17 DIAGNOSIS — Z7901 Long term (current) use of anticoagulants: Secondary | ICD-10-CM | POA: Diagnosis present

## 2015-04-17 DIAGNOSIS — D6859 Other primary thrombophilia: Secondary | ICD-10-CM

## 2015-04-17 LAB — POCT INR: INR: 4.6

## 2015-04-17 NOTE — Patient Instructions (Signed)
Patient instructed to take medications as defined in the Anti-coagulation Track section of this encounter.  Patient instructed to take today's dose.  Patient verbalized understanding of these instructions.    

## 2015-04-17 NOTE — Progress Notes (Signed)
Anti-Coagulation Progress Note  Michele Gillespie is a 39 y.o. female who is currently on an anti-coagulation regimen.    RECENT RESULTS: Recent results are below, the most recent result is correlated with a dose of 100 mg. per week: Lab Results  Component Value Date   INR 4.60 04/17/2015   INR 3.50 03/27/2015   INR 4.10 03/06/2015    ANTI-COAG DOSE: Anticoagulation Dose Instructions as of 04/17/2015      [name] Tue Wed Thu Fri Sat   New Dose 12.5 mg 12.5 mg 12.5 mg 12.5 mg 12.5 mg 12.5 mg 12.5 mg       ANTICOAG SUMMARY: Anticoagulation Episode Summary    Current INR goal 2.0-3.0  Next INR check 05/01/2015  INR from last check 4.60! 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  Patient instructed to take today's dose.  Patient verbalized understanding of these instructions.       FOLLOW-UP Return in 2 weeks (on 05/01/2015) for Follow up INR at 1000h.  [name], III Pharm.D., CACP

## 2015-04-24 NOTE — Progress Notes (Signed)
I have reviewed Dr. Gladstone Pih note.  INR was high. Patient has apparent hypercoagulable state. Coumadin decreased.

## 2015-05-01 ENCOUNTER — Ambulatory Visit (INDEPENDENT_AMBULATORY_CARE_PROVIDER_SITE_OTHER): Payer: Medicaid Other | Admitting: Pharmacist

## 2015-05-01 DIAGNOSIS — D6852 Prothrombin gene mutation: Secondary | ICD-10-CM | POA: Diagnosis not present

## 2015-05-01 DIAGNOSIS — Z7901 Long term (current) use of anticoagulants: Secondary | ICD-10-CM

## 2015-05-01 DIAGNOSIS — D6859 Other primary thrombophilia: Secondary | ICD-10-CM

## 2015-05-01 LAB — POCT INR: INR: 1.7

## 2015-05-01 NOTE — Progress Notes (Signed)
Anti-Coagulation Progress Note  Michele Gillespie is a 39 y.o. female who is currently on an anti-coagulation regimen.    RECENT RESULTS: Recent results are below, the most recent result is correlated with a dose of 87.5 Anti-Coagulation Progress Note  Michele Gillespie is a 39 y.o. female who is currently on an anti-coagulation regimen.    RECENT RESULTS: Recent results are below, the most recent result is correlated with a dose of 87.5 mg. per week: Lab Results  Component Value Date   INR 1.70 05/01/2015   INR 4.60 04/17/2015   INR 3.50 03/27/2015    ANTI-COAG DOSE: Anticoagulation Dose Instructions as of 05/01/2015      Sun Mon Tue Wed Thu Fri Sat   New Dose 12.5 mg 15 mg 12.5 mg 12.5 mg 15 mg 12.5 mg 12.5 mg       ANTICOAG SUMMARY: Anticoagulation Episode Summary    Current INR goal 2.0-3.0  Next INR check 05/22/2015  INR from last check 1.70! (05/01/2015)  Weekly max dose   Target end date Indefinite  [facility]R check location [facility]  Preferred lab   Send INR reminders to    Indications  HYPERCOAGULABLE STATE PRIMARY [D68.52] Long term current use of anticoagulant [Z79.01]        Comments         ANTICOAG TODAY: Anticoagulation Summary as of 05/01/2015    INR goal 2.0-3.0  Selected INR 1.70! (05/01/2015)  Next INR check 05/22/2015  Target end date Indefinite   Indications  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  Patient instructed to take today's dose.  Patient verbalized understanding of these instructions.       FOLLOW-UP Return in 3 weeks (on 05/22/2015) for Follow up INR at 1100h.  [name], III Pharm.D., CACP   mg. per week: Lab Results  Component Value Date   INR 1.70 05/01/2015   INR 4.60 04/17/2015   INR 3.50 03/27/2015    ANTI-COAG DOSE: Anticoagulation Dose Instructions as of 05/01/2015      [name] Tue Wed Thu Fri Sat   New Dose 12.5 mg 15 mg 12.5 mg 12.5 mg 15 mg 12.5 mg 12.5 mg       ANTICOAG SUMMARY: Anticoagulation Episode Summary    Current INR goal 2.0-3.0  Next INR check 05/22/2015  INR from last check 1.70! (05/01/2015)  Weekly max dose   Target end date Indefinite  [facility]R check location [facility]  Preferred lab   Send INR reminders to    Indications  HYPERCOAGULABLE STATE PRIMARY [D68.52] Long term current use of anticoagulant [Z79.01]        Comments         ANTICOAG TODAY: Anticoagulation Summary as of 05/01/2015    INR goal 2.0-3.0  Selected INR 1.70! (05/01/2015)  Next INR check 05/22/2015  Target end date Indefinite   Indications  HYPERCOAGULABLE STATE PRIMARY [D68.52] Long term current use of anticoagulant [Z79.01]      Anticoagulation Episode Summary    [facility] check location [facility]   Preferred lab    Send INR reminders to    Comments  PATIENT INSTRUCTIONS: Patient Instructions  Patient instructed to take medications as defined in the Anti-coagulation Track section of this encounter.  Patient instructed to take today's dose.  Patient verbalized understanding of these instructions.       FOLLOW-UP Return in 3 weeks (on 05/22/2015) for Follow up INR at 1100h.  [name], III Pharm.D., CACP

## 2015-05-01 NOTE — Patient Instructions (Signed)
Patient instructed to take medications as defined in the Anti-coagulation Track section of this encounter.  Patient instructed to take today's dose.  Patient verbalized understanding of these instructions.    

## 2015-05-01 NOTE — Progress Notes (Signed)
INTERNAL MEDICINE TEACHING ATTENDING ADDENDUM - Aldine Contes M.D  Duration- indefinite, Indication- PE, DVT, hypercoagulable state, INR- sub therapeutic. Agree with pharmacy recommendations as outlined in their note.

## 2015-05-22 ENCOUNTER — Ambulatory Visit (INDEPENDENT_AMBULATORY_CARE_PROVIDER_SITE_OTHER): Payer: Medicaid Other | Admitting: Pharmacist

## 2015-05-22 DIAGNOSIS — Z7901 Long term (current) use of anticoagulants: Secondary | ICD-10-CM

## 2015-05-22 DIAGNOSIS — D6852 Prothrombin gene mutation: Secondary | ICD-10-CM

## 2015-05-22 DIAGNOSIS — D6859 Other primary thrombophilia: Secondary | ICD-10-CM

## 2015-05-22 LAB — POCT INR: INR: 3.3

## 2015-05-22 NOTE — Progress Notes (Signed)
Indication: Recurrent venous thrombosis. Duration: Lifelong. INR: Above target. Agree with Dr. Gladstone Pih assessment and plan.

## 2015-05-22 NOTE — Progress Notes (Signed)
Anti-Coagulation Progress Note  Michele Gillespie is a 39 y.o. female who is currently on an anti-coagulation regimen.    RECENT RESULTS: Recent results are below, the most recent result is correlated with a dose of 92.5 mg. per week: Lab Results  Component Value Date   INR 3.3 05/22/2015   INR 1.70 05/01/2015   INR 4.60 04/17/2015    ANTI-COAG DOSE: Anticoagulation Dose Instructions as of 05/22/2015      [name] Tue Wed Thu Fri Sat   New Dose 12.5 mg 12.5 mg 12.5 mg 12.5 mg 15 mg 12.5 mg 12.5 mg       ANTICOAG SUMMARY: Anticoagulation Episode Summary    Current INR goal 2.0-3.0  Next INR check 06/19/2015  INR from last check 3.3! 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 (05/22/2015)  Next INR check 06/19/2015  Target end date Indefinite   Indications  [place] [D68.52] Long term current use of anticoagulant [Z79.01]      Anticoagulation Episode Summary    [facility] check location [facility]   Preferred lab    Send INR reminders to    Comments       PATIENT INSTRUCTIONS: Patient Instructions  Patient instructed to take medications as defined in the Anti-coagulation Track section of this encounter.  Patient instructed to take today's dose.  Patient verbalized understanding of these instructions.       FOLLOW-UP Return in 4 weeks (on 06/19/2015) for Follow up INR at 0900h.  [name], III Pharm.D., CACP

## 2015-05-22 NOTE — Patient Instructions (Signed)
Patient instructed to take medications as defined in the Anti-coagulation Track section of this encounter.  Patient instructed to take today's dose.  Patient verbalized understanding of these instructions.    

## 2015-06-08 ENCOUNTER — Encounter (HOSPITAL_COMMUNITY): Payer: Self-pay

## 2015-06-08 ENCOUNTER — Emergency Department (HOSPITAL_COMMUNITY)
Admission: EM | Admit: 2015-06-08 | Discharge: 2015-06-08 | Disposition: A | Discharge status: Home / Self Care | Payer: Medicaid Other | Attending: Emergency Medicine | Admitting: Emergency Medicine

## 2015-06-08 DIAGNOSIS — Z862 Personal history of diseases of the blood and blood-forming organs and certain disorders involving the immune mechanism: Secondary | ICD-10-CM | POA: Diagnosis not present

## 2015-06-08 DIAGNOSIS — F329 Major depressive disorder, single episode, unspecified: Secondary | ICD-10-CM | POA: Insufficient documentation

## 2015-06-08 DIAGNOSIS — Z86711 Personal history of pulmonary embolism: Secondary | ICD-10-CM | POA: Diagnosis not present

## 2015-06-08 DIAGNOSIS — M542 Cervicalgia: Secondary | ICD-10-CM | POA: Insufficient documentation

## 2015-06-08 DIAGNOSIS — Z86718 Personal history of other venous thrombosis and embolism: Secondary | ICD-10-CM | POA: Diagnosis not present

## 2015-06-08 DIAGNOSIS — J45909 Unspecified asthma, uncomplicated: Secondary | ICD-10-CM | POA: Insufficient documentation

## 2015-06-08 DIAGNOSIS — Z720 Tobacco use: Secondary | ICD-10-CM | POA: Diagnosis not present

## 2015-06-08 DIAGNOSIS — Z79899 Other long term (current) drug therapy: Secondary | ICD-10-CM | POA: Insufficient documentation

## 2015-06-08 DIAGNOSIS — Z7901 Long term (current) use of anticoagulants: Secondary | ICD-10-CM | POA: Insufficient documentation

## 2015-06-08 DIAGNOSIS — M25519 Pain in unspecified shoulder: Secondary | ICD-10-CM | POA: Insufficient documentation

## 2015-06-08 MED ORDER — CYCLOBENZAPRINE HCL 5 MG PO TABS: 5.0000 mg | ORAL_TABLET | Freq: Three times a day (TID) | ORAL | Status: DC | PRN

## 2015-06-08 NOTE — Discharge Instructions (Signed)
Please use heat, ice, ibuprofen as needed for pain. Please follow-up with your primary care provider if symptoms continue to persist.

## 2015-06-08 NOTE — ED Notes (Signed)
In room to discharge patient.  Per tech pt left to go smoke.  Said she would be back.  Holding discharge papers just in case.

## 2015-06-08 NOTE — ED Notes (Signed)
Pt presents with worsening pain to R side of neck.  Pt denies any injury, denies any swelling to area.  Pt has full range of motion reports certain movements increase pain.  Pt taking tylenol w/o relief.

## 2015-06-08 NOTE — ED Provider Notes (Signed)
CSN: [id number]     Arrival date & time 06/08/15  1308 History  This chart was scribed for non-physician practitioner, Lenn Sink, PA-C working with No att. providers found by Judithann Sauger, ED Scribe. The patient was seen in room TR11C/TR11C and the patient's care was started at 2:51 PM    No chief complaint on file.  The history is provided by the patient. No language interpreter was used.   HPI Comments: Michele Gillespie is a 39 y.o. female who presents to the Emergency Department complaining of gradually worsening right-sided neck pain that intermittently radiates up her neck onset 3 days ago. She denies any back pain, fever, chills, CP, SOB, abdominal pain or N/V/D. She denies any previous similar symptoms or any injuries.  She states that she has been taking Tylenol and a "muscle rub" with no relief. Pt is right-handed.   Past Medical History  Diagnosis Date  . Pulmonary embolism  September 07, 2005     her CT angiogram - positive for pulmonary emboli to several branches of the right lower lobe- relatively small clot burden, clear lung; patient started on Coumadin; CT angiogram on January 10, 2006 showed resolution of previously seen pulmonary emboli with minimal basilar atelectasis  . Lung nodule  June 10, 2008     stable tiny noduke noted along the minor fissure of the right lung on CT angio September 11, 09 -  stable for 2 years and consistent with benign disease  . Arm DVT (deep venous thromboembolism), acute  September 23, 2009, January 05, 2010     Doppler study significant with indeterminant age DVT involving the left upper extremity, Doppler performed January 05, 2010 consistent with acute DVT involving the left upper extremity  . Asthma   . Anemia 2007     microcytic anemia, baseline hemoglobin 10-11, MCV at baseline 72-77, secondary to iron deficiency  . Schizophrenia   . Depression   . Leukopenia 2008     unclear etiology baseline WBC  2.8-3.7   Past Surgical History  Procedure  Laterality Date  . Cesarean section       History of 5 C-section  . Tubal ligation    . Exploratory laparotomy with abdominal mass excision  02/2005   Family History  Problem Relation Age of Onset  . Hypertension Mother   . Heart disease Mother   . Diabetes Father   . Birth defects Maternal Aunt   . Birth defects Maternal Uncle   . Diabetes Paternal Grandmother    Social History  Substance Use Topics  . Smoking status: Current Every Day Smoker -- 0.30 packs/day for 0 years    Types: Cigarettes  . Smokeless tobacco: Never Used  . Alcohol Use: Yes   OB History    Gravida Para Term Preterm AB TAB SAB Ectopic Multiple Living   5 4 4  0 0 0 0 0 0 5     Review of Systems  All other systems reviewed and are negative.     Allergies  Review of patient's allergies indicates no known allergies.  Home Medications   Prior to Admission medications   Medication Sig Start Date End Date Taking? Authorizing Provider  albuterol (PROVENTIL HFA;VENTOLIN HFA) 108 (90 BASE) MCG/ACT inhaler Inhale 2 puffs into the lungs every 6 (six) hours as needed for wheezing. For shortness of breath 03/01/14   Ejiroghene Arlyce Dice, MD  bacitracin-polymyxin b (POLYSPORIN) ophthalmic ointment Place 1 application into both eyes every 12 (twelve) hours. apply to  eye every 12 hours while awake 07/03/14   Comer Locket, PA-C  cetirizine (ZYRTEC) 10 MG tablet Take 1 tablet (10 mg total) by mouth daily. 09/13/14   Ejiroghene Arlyce Dice, MD  cyclobenzaprine (FLEXERIL) 5 MG tablet Take 1 tablet (5 mg total) by mouth 3 (three) times daily as needed for muscle spasms. 06/08/15   Okey Regal, PA-C  diphenhydrAMINE (BENADRYL) 25 MG tablet Take 1 tablet (25 mg total) by mouth every 6 (six) hours. 07/03/14   Comer Locket, PA-C  methocarbamol (ROBAXIN) 500 MG tablet Take 2 tablets (1,000 mg total) by mouth 4 (four) times daily. Patient taking differently: Take 1,000 mg by mouth every 6 (six) hours as needed for muscle  spasms.  08/10/14   Carlisle Cater, PA-C  oxyCODONE-acetaminophen (PERCOCET/ROXICET) 5-325 MG per tablet Take 2 tablets by mouth every 6 (six) hours as needed for severe pain. 10/25/14   Montine Circle, PA-C  pantoprazole (PROTONIX) 20 MG tablet Take 1 tablet (20 mg total) by mouth daily. 09/14/13 01/16/15  Otho Bellows, MD  PATADAY 0.2 % SOLN PLACE 1 DROP INTO BOTH EYES EVERY DAY 01/26/15   Ejiroghene Arlyce Dice, MD  traMADol (ULTRAM) 50 MG tablet Take 1 tablet (50 mg total) by mouth every 6 (six) hours as needed. 08/10/14   Carlisle Cater, PA-C  traMADol (ULTRAM) 50 MG tablet Take 1 tablet (50 mg total) by mouth every 6 (six) hours as needed. 01/16/15   Virgel Manifold, MD  warfarin (COUMADIN) 5 MG tablet Take 12.5 mg on Monday and Thur. Take 15 mg on all other days as instructed by Dr Elie Confer 04/10/15   Bartholomew Crews, MD   BP 128/96 mmHg  Pulse 78  Temp(Src) 98.8 F (37.1 C)  Resp 20  SpO2 95%  LMP 06/08/2015 (Exact Date)   Physical Exam  Constitutional: She is oriented to person, place, and time. She appears well-developed and well-nourished.  HENT:  Head: Normocephalic and atraumatic.  Cardiovascular: Normal rate.   Pulmonary/Chest: Effort normal.  Abdominal: She exhibits no distension.  Musculoskeletal: She exhibits tenderness.  External exam shows  No signs of infection, swelling TTP to trapezius and right posterior cervical soft tissue Sensation intact Grip strength 5/5 Cap refill less than 3 seconds Radial pulse 2+  Neurological: She is alert and oriented to person, place, and time.  Skin: Skin is warm and dry.  Psychiatric: She has a normal mood and affect.  Nursing note and vitals reviewed.   ED Course  Procedures (including critical care time) DIAGNOSTIC STUDIES: Oxygen Saturation is 95% on RA, adequate by my interpretation.    COORDINATION OF CARE: 2:56 PM- Pt advised of plan for treatment and pt agrees.    Labs Review Labs Reviewed - No data to  display  Imaging Review No results found. I have personally reviewed and evaluated these images and lab results as part of my medical decision-making.   EKG Interpretation None      MDM   Final diagnoses:  Neck pain  Labs:  Imaging:  Consults:  Therapeutics:  Discharge Meds:   Assessment/Plan: Patient has no findings on physical exam that would necessitate further evaluation or management here in ED. This is likely muscular pain, she has no neurological deficits, signs of infection. Pt will be given a muscle relaxer. Advised to use heat to stretch out area. Patient eloped from the facility before given medication, discharge instructions. Uncertain why patient did not stay, she was pleasant, was not requesting any specific medications, and understood  and agreed to today's plan. She asked to smoke a cigarette after I had evaluated her I informed her that I would per prevent her discharge information than she may leave after that.   I personally performed the services described in this documentation, which was scribed in my presence. The recorded information has been reviewed and is accurate.   Okey Regal, PA-C 06/08/15 1647  Milton Ferguson, MD 06/09/15 [id number]

## 2015-06-08 NOTE — ED Notes (Signed)
Patient left without discharge instructions and script.

## 2015-06-12 ENCOUNTER — Other Ambulatory Visit: Payer: Self-pay

## 2015-06-12 DIAGNOSIS — Z1231 Encounter for screening mammogram for malignant neoplasm of breast: Secondary | ICD-10-CM

## 2015-06-15 ENCOUNTER — Ambulatory Visit
Admission: RE | Admit: 2015-06-15 | Discharge: 2015-06-15 | Disposition: A | Discharge status: Home / Self Care | Payer: Medicaid Other | Source: Ambulatory Visit

## 2015-06-15 DIAGNOSIS — Z1231 Encounter for screening mammogram for malignant neoplasm of breast: Secondary | ICD-10-CM

## 2015-06-17 ENCOUNTER — Encounter (HOSPITAL_COMMUNITY): Payer: Self-pay | Admitting: *Deleted

## 2015-06-17 ENCOUNTER — Emergency Department (HOSPITAL_COMMUNITY)
Admission: EM | Admit: 2015-06-17 | Discharge: 2015-06-17 | Disposition: A | Discharge status: Home / Self Care | Payer: Medicaid Other | Attending: Emergency Medicine | Admitting: Emergency Medicine

## 2015-06-17 ENCOUNTER — Emergency Department (HOSPITAL_COMMUNITY): Payer: Medicaid Other

## 2015-06-17 DIAGNOSIS — Z8709 Personal history of other diseases of the respiratory system: Secondary | ICD-10-CM | POA: Diagnosis not present

## 2015-06-17 DIAGNOSIS — Z862 Personal history of diseases of the blood and blood-forming organs and certain disorders involving the immune mechanism: Secondary | ICD-10-CM | POA: Insufficient documentation

## 2015-06-17 DIAGNOSIS — Z86718 Personal history of other venous thrombosis and embolism: Secondary | ICD-10-CM | POA: Diagnosis not present

## 2015-06-17 DIAGNOSIS — Z7901 Long term (current) use of anticoagulants: Secondary | ICD-10-CM | POA: Insufficient documentation

## 2015-06-17 DIAGNOSIS — Z792 Long term (current) use of antibiotics: Secondary | ICD-10-CM | POA: Diagnosis not present

## 2015-06-17 DIAGNOSIS — Z86711 Personal history of pulmonary embolism: Secondary | ICD-10-CM | POA: Insufficient documentation

## 2015-06-17 DIAGNOSIS — J45909 Unspecified asthma, uncomplicated: Secondary | ICD-10-CM | POA: Insufficient documentation

## 2015-06-17 DIAGNOSIS — Z8659 Personal history of other mental and behavioral disorders: Secondary | ICD-10-CM | POA: Diagnosis not present

## 2015-06-17 DIAGNOSIS — Z79899 Other long term (current) drug therapy: Secondary | ICD-10-CM | POA: Diagnosis not present

## 2015-06-17 DIAGNOSIS — Z720 Tobacco use: Secondary | ICD-10-CM | POA: Insufficient documentation

## 2015-06-17 DIAGNOSIS — R51 Headache: Secondary | ICD-10-CM | POA: Diagnosis present

## 2015-06-17 DIAGNOSIS — R519 Headache, unspecified: Secondary | ICD-10-CM

## 2015-06-17 MED ORDER — PROCHLORPERAZINE EDISYLATE 5 MG/ML IJ SOLN
10.0000 mg | Freq: Once | INTRAMUSCULAR | Status: AC
  Administered 2015-06-17: 10 mg via INTRAVENOUS
  Filled 2015-06-17: qty 2

## 2015-06-17 MED ORDER — IOHEXOL 350 MG/ML SOLN
50.0000 mL | Freq: Once | INTRAVENOUS | Status: AC | PRN
  Administered 2015-06-17: 50 mL via INTRAVENOUS

## 2015-06-17 MED ORDER — SODIUM CHLORIDE 0.9 % IV SOLN
1000.0000 mL | INTRAVENOUS | Status: DC
  Administered 2015-06-17: 1000 mL via INTRAVENOUS

## 2015-06-17 MED ORDER — MAGNESIUM SULFATE 2 GM/50ML IV SOLN
2.0000 g | Freq: Once | INTRAVENOUS | Status: AC
  Administered 2015-06-17: 2 g via INTRAVENOUS
  Filled 2015-06-17: qty 50

## 2015-06-17 MED ORDER — KETOROLAC TROMETHAMINE 30 MG/ML IJ SOLN
30.0000 mg | Freq: Once | INTRAMUSCULAR | Status: AC
  Administered 2015-06-17: 30 mg via INTRAVENOUS
  Filled 2015-06-17: qty 1

## 2015-06-17 MED ORDER — VALPROATE SODIUM 500 MG/5ML IV SOLN
250.0000 mg | Freq: Once | INTRAVENOUS | Status: DC
  Filled 2015-06-17: qty 2.5

## 2015-06-17 MED ORDER — DEXAMETHASONE SODIUM PHOSPHATE 10 MG/ML IJ SOLN
10.0000 mg | Freq: Once | INTRAMUSCULAR | Status: AC
  Administered 2015-06-17: 10 mg via INTRAVENOUS
  Filled 2015-06-17: qty 1

## 2015-06-17 MED ORDER — SODIUM CHLORIDE 0.9 % IV SOLN
1000.0000 mL | Freq: Once | INTRAVENOUS | Status: AC
  Administered 2015-06-17: 1000 mL via INTRAVENOUS

## 2015-06-17 MED ORDER — DIPHENHYDRAMINE HCL 50 MG/ML IJ SOLN
25.0000 mg | Freq: Once | INTRAMUSCULAR | Status: AC
  Administered 2015-06-17: 25 mg via INTRAVENOUS
  Filled 2015-06-17: qty 1

## 2015-06-17 NOTE — ED Notes (Signed)
Pt states headaches since her neck was hurting (seen here on 9/8).  States her neck no longer hurts but since yesterday her headache has become severe.  Pain began in occipital region and now is "all over".  Photophobic, but denies nausea.

## 2015-06-17 NOTE — ED Notes (Signed)
Patient transported to CT 

## 2015-06-17 NOTE — ED Notes (Signed)
Patient transported back from CT 

## 2015-06-17 NOTE — ED Provider Notes (Signed)
CSN: [id number]     Arrival date & time 06/17/15  0813 History   First MD Initiated Contact with Patient 06/17/15 [phone]     Chief Complaint  Patient presents with  . Headache     (Consider location/radiation/quality/duration/timing/severity/associated sxs/prior Treatment) Patient is a 39 y.o. female presenting with headaches.  Headache Pain location:  Generalized Quality:  Dull Radiates to:  Does not radiate Onset quality:  Gradual Duration:  3 days Timing:  Constant Progression:  Worsening Chronicity:  New Similar to prior headaches: no   Context: not activity and not eating   Relieved by:  None tried Worsened by:  Nothing Ineffective treatments:  None tried Associated symptoms: no back pain, no fatigue, no fever, no numbness and no weakness     Past Medical History  Diagnosis Date  . Pulmonary embolism  September 07, 2005     her CT angiogram - positive for pulmonary emboli to several branches of the right lower lobe- relatively small clot burden, clear lung; patient started on Coumadin; CT angiogram on January 10, 2006 showed resolution of previously seen pulmonary emboli with minimal basilar atelectasis  . Lung nodule  June 10, 2008     stable tiny noduke noted along the minor fissure of the right lung on CT angio September 11, 09 -  stable for 2 years and consistent with benign disease  . Arm DVT (deep venous thromboembolism), acute  September 23, 2009, January 05, 2010     Doppler study significant with indeterminant age DVT involving the left upper extremity, Doppler performed January 05, 2010 consistent with acute DVT involving the left upper extremity  . Asthma   . Anemia 2007     microcytic anemia, baseline hemoglobin 10-11, MCV at baseline 72-77, secondary to iron deficiency  . Schizophrenia   . Depression   . Leukopenia 2008     unclear etiology baseline WBC  2.8-3.7   Past Surgical History  Procedure Laterality Date  . Cesarean section       History of 5 C-section   . Tubal ligation    . Exploratory laparotomy with abdominal mass excision  02/2005   Family History  Problem Relation Age of Onset  . Hypertension Mother   . Heart disease Mother   . Diabetes Father   . Birth defects Maternal Aunt   . Birth defects Maternal Uncle   . Diabetes Paternal Grandmother    Social History  Substance Use Topics  . Smoking status: Current Every Day Smoker -- 0.50 packs/day for 0 years    Types: Cigarettes  . Smokeless tobacco: Never Used  . Alcohol Use: Yes   OB History    Gravida Para Term Preterm AB TAB SAB Ectopic Multiple Living   5 4 4  0 0 0 0 0 0 5     Review of Systems  Constitutional: Negative for fever and fatigue.  Musculoskeletal: Negative for back pain.  Neurological: Positive for headaches. Negative for tremors, weakness and numbness.  All other systems reviewed and are negative.     Allergies  Review of patient's allergies indicates no known allergies.  Home Medications   Prior to Admission medications   Medication Sig Start Date End Date Taking? Authorizing Provider  acetaminophen (TYLENOL) 500 MG tablet Take 1,000 mg by mouth every 6 (six) hours as needed for headache.   Yes Historical Provider, MD  albuterol (PROVENTIL HFA;VENTOLIN HFA) 108 (90 BASE) MCG/ACT inhaler Inhale 2 puffs into the lungs every 6 (six) hours as needed  for wheezing. For shortness of breath 03/01/14  Yes Ejiroghene E Emokpae, MD  cetirizine (ZYRTEC) 10 MG tablet Take 1 tablet (10 mg total) by mouth daily. 09/13/14  Yes Ejiroghene E Emokpae, MD  clindamycin (CLEOCIN) 300 MG capsule Take 300 mg by mouth 2 (two) times daily. For 7 days 9-16 to 9-23   Yes Historical Provider, MD  cyclobenzaprine (FLEXERIL) 5 MG tablet Take 1 tablet (5 mg total) by mouth 3 (three) times daily as needed for muscle spasms. 06/08/15  Yes Jeffrey Hedges, PA-C  methocarbamol (ROBAXIN) 500 MG tablet Take 2 tablets (1,000 mg total) by mouth 4 (four) times daily. Patient taking differently:  Take 1,000 mg by mouth every 6 (six) hours as needed for muscle spasms.  08/10/14  Yes Carlisle Cater, PA-C  pantoprazole (PROTONIX) 20 MG tablet Take 1 tablet (20 mg total) by mouth daily. 09/14/13 06/17/15 Yes Otho Bellows, MD  PATADAY 0.2 % SOLN PLACE 1 DROP INTO BOTH EYES EVERY DAY 01/26/15  Yes Ejiroghene Arlyce Dice, MD  warfarin (COUMADIN) 5 MG tablet Take 12.5 mg on Monday and [place]. Take 15 mg on all other days as instructed by Dr Elie Confer Patient taking differently: Take 12.5-15 mg by mouth daily. Take 12.5 mg everyday expect Thursday take 15 mg 04/10/15  Yes Bartholomew Crews, MD  diphenhydrAMINE (BENADRYL) 25 MG tablet Take 1 tablet (25 mg total) by mouth every 6 (six) hours. Patient not taking: Reported on 06/17/2015 07/03/14   Comer Locket, PA-C  oxyCODONE-acetaminophen (PERCOCET/ROXICET) 5-325 MG per tablet Take 2 tablets by mouth every 6 (six) hours as needed for severe pain. Patient not taking: Reported on 06/17/2015 10/25/14   Montine Circle, PA-C  traMADol (ULTRAM) 50 MG tablet Take 1 tablet (50 mg total) by mouth every 6 (six) hours as needed. Patient not taking: Reported on 06/17/2015 01/16/15   Virgel Manifold, MD   BP 126/90 mmHg  Pulse 70  Temp(Src) 98.7 F (37.1 C) (Oral)  Resp 16  Ht 5' 4.5" (1.638 m)  Wt 201 lb (91.173 kg)  BMI 33.98 kg/m2  SpO2 98%  LMP 06/07/2015 Physical Exam  Constitutional: She is oriented to person, place, and time. She appears well-developed and well-nourished.  HENT:  Head: Normocephalic and atraumatic.  Eyes: Conjunctivae and EOM are normal. Right eye exhibits no discharge. Left eye exhibits no discharge.  Cardiovascular: Normal rate and regular rhythm.   Pulmonary/Chest: Effort normal and breath sounds normal. No respiratory distress.  Abdominal: Soft. She exhibits no distension. There is no tenderness. There is no rebound.  Musculoskeletal: Normal range of motion. She exhibits no edema or tenderness.  Neurological: She is alert and  oriented to person, place, and time.  No altered mental status, able to give full seemingly accurate history.  Face is symmetric, EOM's intact, pupils equal and reactive, vision intact, tongue and uvula midline without deviation Upper and Lower extremity motor 5/5, intact pain perception in distal extremities, 2+ reflexes in biceps, patella and achilles tendons. Finger to nose normal, heel to shin normal. Walks without assistance or evident ataxia.  Skin: Skin is warm and dry.  Nursing note and vitals reviewed.   ED Course  Procedures (including critical care time) Labs Review Labs Reviewed - No data to display  Imaging Review Ct Angio Head W/cm &/or Wo Cm  06/17/2015   CLINICAL DATA:  Headache since 06/06/2015. Photophobia. No history of trauma. Patient anticoagulated for treatment of pulmonary emboli.  EXAM: CT ANGIOGRAPHY HEAD AND NECK  TECHNIQUE: Multidetector CT  imaging of the head and neck was performed using the standard protocol during bolus administration of intravenous contrast. Multiplanar CT image reconstructions and MIPs were obtained to evaluate the vascular anatomy. Carotid stenosis measurements (when applicable) are obtained utilizing NASCET criteria, using the distal internal carotid diameter as the denominator.  CONTRAST:  75mL OMNIPAQUE IOHEXOL 350 MG/ML SOLN  COMPARISON:  07/13/2010.  09/04/2005.  FINDINGS: CT HEAD  The brain has a normal appearance without evidence of malformation, atrophy, old or acute infarction, mass lesion, hemorrhage, hydrocephalus or extra-axial collection. The calvarium is unremarkable. The paranasal sinuses, middle ears and mastoids are clear.  CTA NECK  Aortic arch: There is a small amount of pericardial fluid. The aortic arch appears normal. Branching pattern of the brachiocephalic vessels is normal.  Right carotid system: Common carotid artery widely patent to the bifurcation. Normal carotid bifurcation. Normal cervical internal carotid artery.  Left  carotid system: Common carotid artery widely patent to the bifurcation. Normal bifurcation. Normal cervical internal carotid artery.  Vertebral arteries:Both vertebral arteries widely patent at their origins and through the cervical region.  Skeleton: No significant bone finding  Other neck: No mass or lymphadenopathy.  CTA HEAD  Anterior circulation: Both internal carotid arteries widely patent through the skullbase and siphon regions. The anterior and middle cerebral vessels are patent without proximal stenosis, aneurysm or vascular malformation.  Posterior circulation: Both vertebral arteries widely patent through the foramen magnum with the left being dominant. No basilar stenosis. Posterior circulation branch vessels are normal.  Venous sinuses: Patent and normal  Anatomic variants: None significant  Delayed phase: No abnormal enhancement  IMPRESSION: Normal examination. No cause of the presenting symptoms is identified.  The does appear to be a small amount of pericardial fluid in the superior recess. Significance uncertain. Similar appearance to a CT scan of the chest done 09/15/2014, therefore probably not of acute relevance.   Electronically Signed   By: Nelson Chimes M.D.   On: 06/17/2015 10:08   Ct Angio Neck W/cm &/or Wo/cm  06/17/2015   CLINICAL DATA:  Headache since 06/06/2015. Photophobia. No history of trauma. Patient anticoagulated for treatment of pulmonary emboli.  EXAM: CT ANGIOGRAPHY HEAD AND NECK  TECHNIQUE: Multidetector CT imaging of the head and neck was performed using the standard protocol during bolus administration of intravenous contrast. Multiplanar CT image reconstructions and MIPs were obtained to evaluate the vascular anatomy. Carotid stenosis measurements (when applicable) are obtained utilizing NASCET criteria, using the distal internal carotid diameter as the denominator.  CONTRAST:  34mL OMNIPAQUE IOHEXOL 350 MG/ML SOLN  COMPARISON:  07/13/2010.  09/04/2005.  FINDINGS: CT HEAD   The brain has a normal appearance without evidence of malformation, atrophy, old or acute infarction, mass lesion, hemorrhage, hydrocephalus or extra-axial collection. The calvarium is unremarkable. The paranasal sinuses, middle ears and mastoids are clear.  CTA NECK  Aortic arch: There is a small amount of pericardial fluid. The aortic arch appears normal. Branching pattern of the brachiocephalic vessels is normal.  Right carotid system: Common carotid artery widely patent to the bifurcation. Normal carotid bifurcation. Normal cervical internal carotid artery.  Left carotid system: Common carotid artery widely patent to the bifurcation. Normal bifurcation. Normal cervical internal carotid artery.  Vertebral arteries:Both vertebral arteries widely patent at their origins and through the cervical region.  Skeleton: No significant bone finding  Other neck: No mass or lymphadenopathy.  CTA HEAD  Anterior circulation: Both internal carotid arteries widely patent through the skullbase and siphon regions. The anterior  and middle cerebral vessels are patent without proximal stenosis, aneurysm or vascular malformation.  Posterior circulation: Both vertebral arteries widely patent through the foramen magnum with the left being dominant. No basilar stenosis. Posterior circulation branch vessels are normal.  Venous sinuses: Patent and normal  Anatomic variants: None significant  Delayed phase: No abnormal enhancement  IMPRESSION: Normal examination. No cause of the presenting symptoms is identified.  The does appear to be a small amount of pericardial fluid in the superior recess. Significance uncertain. Similar appearance to a CT scan of the chest done 09/15/2014, therefore probably not of acute relevance.   Electronically Signed   By: Nelson Chimes M.D.   On: 06/17/2015 10:08   Mm Digital Screening Bilateral  06/15/2015   CLINICAL DATA:  Screening.  EXAM: DIGITAL SCREENING BILATERAL MAMMOGRAM WITH CAD  COMPARISON:   Previous exam(s).  ACR Breast Density Category c: The breast tissue is heterogeneously dense, which may obscure small masses.  FINDINGS: There are no findings suspicious for malignancy. Images were processed with CAD.  IMPRESSION: No mammographic evidence of malignancy. A result letter of this screening mammogram will be mailed directly to the patient.  RECOMMENDATION: Screening mammogram in one year. (Code:[id number])  BI-RADS CATEGORY  1: Negative.   Electronically Signed   By: Ammie Ferrier M.D.   On: 06/15/2015 16:32   I have personally reviewed and evaluated these images and lab results as part of my medical decision-making.   EKG Interpretation None      MDM   Final diagnoses:  Headache  Headache   39 year old female here with a headache after having a neck pain a few days ago. Exam normal as above. No fevers nuchal rigidity or other signs of meningitis or subarachnoid. She has a history of multiple clots in her arms lungs and legs which raises the possibility of this being a blood clot as well. We'll get a CTA of her head and neck and treat her headache otherwise. If CT negative the patient can likely be discharged home.  CTA of head and neck are negative for any acute abnormalities. Headache improved with cocktail. We'll discharge home to follow with PCP for further management of her headaches.  I have personally and contemperaneously reviewed labs and imaging and used in my decision making as above.   A medical screening exam was performed and I feel the patient has had an appropriate workup for their chief complaint at this time and likelihood of emergent condition existing is low. They have been counseled on decision, discharge, follow up and which symptoms necessitate immediate return to the emergency department. They or their family verbally stated understanding and agreement with plan and discharged in stable condition.      Merrily Pew, MD 06/17/15 1141

## 2015-06-19 ENCOUNTER — Ambulatory Visit: Payer: Medicaid Other

## 2015-06-26 ENCOUNTER — Ambulatory Visit (INDEPENDENT_AMBULATORY_CARE_PROVIDER_SITE_OTHER): Payer: Medicaid Other | Admitting: Pharmacist

## 2015-06-26 DIAGNOSIS — D6859 Other primary thrombophilia: Secondary | ICD-10-CM

## 2015-06-26 DIAGNOSIS — D6852 Prothrombin gene mutation: Secondary | ICD-10-CM

## 2015-06-26 DIAGNOSIS — Z7901 Long term (current) use of anticoagulants: Secondary | ICD-10-CM | POA: Diagnosis not present

## 2015-06-26 LAB — POCT INR: INR: 1.9

## 2015-06-26 NOTE — Patient Instructions (Signed)
Patient instructed to take medications as defined in the Anti-coagulation Track section of this encounter.  Patient instructed to take today's dose.  Patient verbalized understanding of these instructions.    

## 2015-06-26 NOTE — Progress Notes (Signed)
Anti-Coagulation Progress Note  Michele Gillespie is a 39 y.o. female who is currently on an anti-coagulation regimen.    RECENT RESULTS: Recent results are below, the most recent result is correlated with a dose of 90 mg. per week: Lab Results  Component Value Date   INR 1.90 06/26/2015   INR 3.3 05/22/2015   INR 1.70 05/01/2015    ANTI-COAG DOSE: Anticoagulation Dose Instructions as of 06/26/2015      Sun Mon Tue Wed Thu Fri Sat   New Dose 12.5 mg 15 mg 12.5 mg 12.5 mg 15 mg 12.5 mg 12.5 mg       ANTICOAG SUMMARY: Anticoagulation Episode Summary    Current INR goal 2.0-3.0  Next INR check 07/24/2015  INR from last check 1.90! 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  Patient instructed to take today's dose.  Patient verbalized understanding of these instructions.       FOLLOW-UP Return in 4 weeks (on 07/24/2015) for Follow up INR at 1045h.  [name], III Pharm.D., CACP

## 2015-06-27 ENCOUNTER — Encounter: Payer: Self-pay | Admitting: Internal Medicine

## 2015-06-27 ENCOUNTER — Ambulatory Visit (INDEPENDENT_AMBULATORY_CARE_PROVIDER_SITE_OTHER): Payer: Medicaid Other | Admitting: Internal Medicine

## 2015-06-27 VITALS — BP 140/93 | HR 64 | Temp 98.5°F | Ht 64.0 in | Wt 199.0 lb

## 2015-06-27 DIAGNOSIS — Z23 Encounter for immunization: Secondary | ICD-10-CM

## 2015-06-27 DIAGNOSIS — N63 Unspecified lump in breast: Secondary | ICD-10-CM | POA: Diagnosis not present

## 2015-06-27 DIAGNOSIS — K219 Gastro-esophageal reflux disease without esophagitis: Secondary | ICD-10-CM | POA: Diagnosis not present

## 2015-06-27 DIAGNOSIS — D6859 Other primary thrombophilia: Secondary | ICD-10-CM

## 2015-06-27 DIAGNOSIS — Z0000 Encounter for general adult medical examination without abnormal findings: Secondary | ICD-10-CM

## 2015-06-27 DIAGNOSIS — J3089 Other allergic rhinitis: Secondary | ICD-10-CM

## 2015-06-27 DIAGNOSIS — F17210 Nicotine dependence, cigarettes, uncomplicated: Secondary | ICD-10-CM | POA: Diagnosis not present

## 2015-06-27 DIAGNOSIS — F17200 Nicotine dependence, unspecified, uncomplicated: Secondary | ICD-10-CM

## 2015-06-27 MED ORDER — OLOPATADINE HCL 0.2 % OP SOLN: OPHTHALMIC | Status: DC

## 2015-06-27 MED ORDER — NICOTINE 21 MG/24HR TD PT24: 21.0000 mg | MEDICATED_PATCH | TRANSDERMAL | Status: DC

## 2015-06-27 MED ORDER — LORATADINE 10 MG PO CAPS
10.0000 mg | ORAL_CAPSULE | Freq: Every day | ORAL | Status: DC
Start: 2015-06-27 — End: 2016-01-20

## 2015-06-27 MED ORDER — OMEPRAZOLE 20 MG PO CPDR: 20.0000 mg | DELAYED_RELEASE_CAPSULE | Freq: Every day | ORAL | Status: DC

## 2015-06-27 NOTE — Assessment & Plan Note (Signed)
Allergic rhinitis with conjunctivitis. Worse when she is in a dusty room, change of season. Has itchy and watery eyes.   Plan- Eye drops work, pataday works, but says they are not too effective when allergies are really bad. - Will switch zyrtec to Loratadine 10mg  daily. She says zyrtec does not work.

## 2015-06-27 NOTE — Assessment & Plan Note (Signed)
Has had two episodes of venous thromboembolic dx. PE- 2006, DVT- 2010. She is supposed to be on Coumadin, But appears INR has hardly been with therapeutic range. It is either too high or too low. Pt says she has been taking her coumadin as prescribed. Talked to patient about options but she is adamant about remaining on coumadin, she says she will rather come in for INR checks. She is afraid of all the side effects of xarelto she read about and is not interested in trying other drugs/NOACs.  Plan- Will continue warfarin for now. INR- 06/26/2015- 1.9.

## 2015-06-27 NOTE — Assessment & Plan Note (Signed)
She is ready to quit smoking. She will ike to uses Patches. She has used them before and quit for 1 year, but then started smoking again for the past 5 yrs after she lost her mother.  Plan- Materials to help quit smoking given to pt - Emphasized she should not smoke while on the patch - Start at 21mg  daily patch for 6 weeks, then 14mg  for 2 weeks, then 7mg  for 2 weeks. - Follow up in 4 weeks, to access and encourage smoking cessation.

## 2015-06-27 NOTE — Progress Notes (Signed)
Patient ID: Michele Gillespie, female   DOB: 05/11/1976, 39 y.o.   MRN: [id number]   Subjective:   Patient ID: Michele Gillespie female   DOB: 07/04/1976 39 y.o.   MRN: [id number]  HPI: [name] is a 39 y.o. with PMH listed below, presented today for routine follow up visit.  Past Medical History  Diagnosis Date  . Pulmonary embolism  September 07, 2005     her CT angiogram - positive for pulmonary emboli to several branches of the right lower lobe- relatively small clot burden, clear lung; patient started on Coumadin; CT angiogram on January 10, 2006 showed resolution of previously seen pulmonary emboli with minimal basilar atelectasis  . Lung nodule  June 10, 2008     stable tiny noduke noted along the minor fissure of the right lung on CT angio September 11, 09 -  stable for 2 years and consistent with benign disease  . Arm DVT (deep venous thromboembolism), acute  September 23, 2009, January 05, 2010     Doppler study significant with indeterminant age DVT involving the left upper extremity, Doppler performed January 05, 2010 consistent with acute DVT involving the left upper extremity  . Asthma   . Anemia 2007     microcytic anemia, baseline hemoglobin 10-11, MCV at baseline 72-77, secondary to iron deficiency  . Schizophrenia   . Depression   . Leukopenia 2008     unclear etiology baseline WBC  2.8-3.7   Current Outpatient Prescriptions  Medication Sig Dispense Refill  . acetaminophen (TYLENOL) 500 MG tablet Take 1,000 mg by mouth every 6 (six) hours as needed for headache.    . albuterol (PROVENTIL HFA;VENTOLIN HFA) 108 (90 BASE) MCG/ACT inhaler Inhale 2 puffs into the lungs every 6 (six) hours as needed for wheezing. For shortness of breath 1 Inhaler 3  . cetirizine (ZYRTEC) 10 MG tablet Take 1 tablet (10 mg total) by mouth daily. 30 tablet 0  . clindamycin (CLEOCIN) 300 MG capsule Take 300 mg by mouth 2 (two) times daily. For 7 days 9-16 to 9-23    . cyclobenzaprine (FLEXERIL) 5 MG  tablet Take 1 tablet (5 mg total) by mouth 3 (three) times daily as needed for muscle spasms. 30 tablet 0  . diphenhydrAMINE (BENADRYL) 25 MG tablet Take 1 tablet (25 mg total) by mouth every 6 (six) hours. 20 tablet 0  . methocarbamol (ROBAXIN) 500 MG tablet Take 2 tablets (1,000 mg total) by mouth 4 (four) times daily. (Patient taking differently: Take 1,000 mg by mouth every 6 (six) hours as needed for muscle spasms. ) 20 tablet 0  . oxyCODONE-acetaminophen (PERCOCET/ROXICET) 5-325 MG per tablet Take 2 tablets by mouth every 6 (six) hours as needed for severe pain. 15 tablet 0  . pantoprazole (PROTONIX) 20 MG tablet Take 1 tablet (20 mg total) by mouth daily. 30 tablet 1  . PATADAY 0.2 % SOLN PLACE 1 DROP INTO BOTH EYES EVERY DAY 2.5 mL 2  . traMADol (ULTRAM) 50 MG tablet Take 1 tablet (50 mg total) by mouth every 6 (six) hours as needed. 12 tablet 0  . warfarin (COUMADIN) 5 MG tablet Take 12.5 mg on Monday and Thur. Take 15 mg on all other days as instructed by Dr Elie Confer (Patient taking differently: Take 12.5-15 mg by mouth daily. Take 12.5 mg everyday expect Thursday take 15 mg) 112 tablet 1   No current facility-administered medications for this visit.   Family History  Problem Relation Age of Onset  .  Hypertension Mother   . Heart disease Mother   . Diabetes Father   . Birth defects Maternal Aunt   . Birth defects Maternal Uncle   . Diabetes Paternal Grandmother    Social History   Social History  . Marital Status: Married    Spouse Name: N/A  . Number of Children: N/A  . Years of Education: N/A   Social History Main Topics  . Smoking status: Current Every Day Smoker -- 0.50 packs/day for 0 years    Types: Cigarettes  . Smokeless tobacco: Never Used     Comment: about 1/2 pack a day  . Alcohol Use: 0.0 oz/week    0 Standard drinks or equivalent per week  . Drug Use: Yes    Special: Marijuana  . Sexual Activity: Yes    Birth Control/ Protection: None   Other Topics  Concern  . Not on file   Social History Narrative    Works as a Quarry manager, 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, has used cocaine in the past, history of multiple incarcerations last one in November 2011   Review of Systems: CONSTITUTIONAL- No Fever, weightloss, night sweat or change in appetite. SKIN- No Rash, colour changes or itching. HEAD- No Headache or dizziness. Mouth/throat- No Sorethroat, dentures, or bleeding gums. RESPIRATORY- No Cough or SOB. CARDIAC- No Palpitations, or chest pain. GI- No vomiting, diarrhoea, abd pain. URINARY- No freq or dysuria. NEUROLOGIC- No Numbness, syncope, seizures or burning. [facility]- Denies depression or anxiety.  Objective:  Physical Exam: Filed Vitals:   06/27/15 1454  BP: 140/93  Pulse: 64  Temp: 98.5 F (36.9 C)  TempSrc: Oral  Height: 5\' 4"  (1.626 m)  Weight: 199 lb (90.266 kg)  SpO2: 100%   GENERAL- alert, co-operative, appears as stated age, not in any distress. HEENT- Atraumatic, normocephalic, PERRL, oral mucosa appears moist, neck supple. CARDIAC- RRR, no murmurs, rubs or gallops. RESP- Moving equal volumes of air, and no wheezes or crackles. ABDOMEN- Soft, nontender, bowel sounds present. NEURO- Alert and oriented, Gait- Normal. EXTREMITIES- pulse 2+, symmetric, no pedal edema. SKIN- Warm, dry, No rash or lesion. PSYCH- Normal mood and affect, appropriate thought content and speech.  Assessment & Plan:   The patient's case and plan of care was discussed with attending physician, Dr. Daryll Drown.  Please see problem based charting for assessment and plan.

## 2015-06-27 NOTE — Assessment & Plan Note (Signed)
She says the protonix is no longer effective. Will switch to omeprazole- 20mg  daily.

## 2015-06-27 NOTE — Assessment & Plan Note (Signed)
Pt requesting Twix vaccine- hep A and B. She is up to date on her screenings.   Plan- Acute hepatitis panel today, if normal, hep A and B vaccine next visit.

## 2015-06-27 NOTE — Assessment & Plan Note (Signed)
LAst mammogram, this year, normal , no masses. Next due- next year.- 2017.

## 2015-06-27 NOTE — Patient Instructions (Addendum)
We will be prescribing the nicotine patch for you. You have to decide on a day, to quit smoking, and from that day, start using the pastch and do not smoke again. The patches are over the counter.  Instructions-  For the amount of cigarettes you are smoking, you should start with the highest dose of the nicotine patch (21 mg/day) for six weeks, followed by 14 mg/day for two weeks, and finish with 7 mg/day for two weeks. That will be a total of 10 weeks. We will see you in 2-3 weeks, for the vaccination- the twix vaccine, for today we will do blood work.     Smoking Cessation Quitting smoking is important to your health and has many advantages. However, it is not always easy to quit since nicotine is a very addictive drug. Oftentimes, people try 3 times or more before being able to quit. This document explains the best ways for you to prepare to quit smoking. Quitting takes hard work and a lot of effort, but you can do it. ADVANTAGES OF QUITTING SMOKING  You will live longer, feel better, and live better.  Your body will feel the impact of quitting smoking almost immediately.  Within 20 minutes, blood pressure decreases. Your pulse returns to its normal level.  After 8 hours, carbon monoxide levels in the blood return to normal. Your oxygen level increases.  After 24 hours, the chance of having a heart attack starts to decrease. Your breath, hair, and body stop smelling like smoke.  After 48 hours, damaged nerve endings begin to recover. Your sense of taste and smell improve.  After 72 hours, the body is virtually free of nicotine. Your bronchial tubes relax and breathing becomes easier.  After 2 to 12 weeks, lungs can hold more air. Exercise becomes easier and circulation improves.  The risk of having a heart attack, stroke, cancer, or lung disease is greatly reduced.  After 1 year, the risk of coronary heart disease is cut in half.  After 5 years, the risk of stroke falls to the same  as a nonsmoker.  After 10 years, the risk of lung cancer is cut in half and the risk of other cancers decreases significantly.  After 15 years, the risk of coronary heart disease drops, usually to the level of a nonsmoker.  If you are pregnant, quitting smoking will improve your chances of having a healthy baby.  The people you live with, especially any children, will be healthier.  You will have extra money to spend on things other than cigarettes. QUESTIONS TO THINK ABOUT BEFORE ATTEMPTING TO QUIT You may want to talk about your answers with your health care provider.  Why do you want to quit?  If you tried to quit in the past, what helped and what did not?  What will be the most difficult situations for you after you quit? How will you plan to handle them?  Who can help you through the tough times? Your family? Friends? A health care provider?  What pleasures do you get from smoking? What ways can you still get pleasure if you quit? Here are some questions to ask your health care provider:  How can you help me to be successful at quitting?  What medicine do you think would be best for me and how should I take it?  What should I do if I need more help?  What is smoking withdrawal like? How can I get information on withdrawal? GET READY  Set  a quit date.  Change your environment by getting rid of all cigarettes, ashtrays, matches, and lighters in your home, car, or work. Do not let people smoke in your home.  Review your past attempts to quit. Think about what worked and what did not. GET SUPPORT AND ENCOURAGEMENT You have a better chance of being successful if you have help. You can get support in many ways.  Tell your family, friends, and coworkers that you are going to quit and need their support. Ask them not to smoke around you.  Get individual, group, or telephone counseling and support. Programs are available at [employer] and health centers. Call your local  health department for information about programs in your area.  Spiritual beliefs and practices may help some smokers quit.  Download a "quit meter" on your computer to keep track of quit statistics, such as how long you have gone without smoking, cigarettes not smoked, and money saved.  Get a self-help book about quitting smoking and staying off tobacco. [place] yourself from urges to smoke. Talk to someone, go for a walk, or occupy your time with a task.  Change your normal routine. Take a different route to work. Drink tea instead of coffee. Eat breakfast in a different place.  Reduce your stress. Take a hot bath, exercise, or read a book.  Plan something enjoyable to do every day. Reward yourself for not smoking.  Explore interactive web-based programs that specialize in helping you quit. GET MEDICINE AND USE IT CORRECTLY Medicines can help you stop smoking and decrease the urge to smoke. Combining medicine with the above behavioral methods and support can greatly increase your chances of successfully quitting smoking.  Nicotine replacement therapy helps deliver nicotine to your body without the negative effects and risks of smoking. Nicotine replacement therapy includes nicotine gum, lozenges, inhalers, nasal sprays, and skin patches. Some may be available over-the-counter and others require a prescription.  Antidepressant medicine helps people abstain from smoking, but how this works is unknown. This medicine is available by prescription.  Nicotinic receptor partial agonist medicine simulates the effect of nicotine in your brain. This medicine is available by prescription. Ask your health care provider for advice about which medicines to use and how to use them based on your health history. Your health care provider will tell you what side effects to look out for if you choose to be on a medicine or therapy. Carefully read the information on the  package. Do not use any other product containing nicotine while using a nicotine replacement product.  RELAPSE OR DIFFICULT SITUATIONS Most relapses occur within the first 3 months after quitting. Do not be discouraged if you start smoking again. Remember, most people try several times before finally quitting. You may have symptoms of withdrawal because your body is used to nicotine. You may crave cigarettes, be irritable, feel very hungry, cough often, get headaches, or have difficulty concentrating. The withdrawal symptoms are only temporary. They are strongest when you first quit, but they will go away within 10-14 days. To reduce the chances of relapse, try to:  Avoid drinking alcohol. Drinking lowers your chances of successfully quitting.  Reduce the amount of caffeine you consume. Once you quit smoking, the amount of caffeine in your body increases and can give you symptoms, such as a rapid heartbeat, sweating, and anxiety.  Avoid smokers because they can make you want to smoke.  Do not let weight gain distract you.  Many smokers will gain weight when they quit, usually less than 10 pounds. Eat a healthy diet and stay active. You can always lose the weight gained after you quit.  Find ways to improve your mood other than smoking. FOR MORE INFORMATION  www.smokefree.gov  Document Released: 09/10/2001 Document Revised: 01/31/2014 Document Reviewed: 12/26/2011 [facility] Patient Information 2015 [place], [place]. This information is not intended to replace advice given to you by your health care provider. Make sure you discuss any questions you have with your health care provider.

## 2015-06-27 NOTE — Progress Notes (Signed)
I have reviewed Dr. Gladstone Pih note.  Michele Gillespie is treated with anticoagulation for a hypercoag state.  She is on very high doses of coumadin.  INR was 1.9 and her dose was increased.

## 2015-06-28 LAB — HEPATITIS PANEL, ACUTE
Hep A IgM: NEGATIVE
Hep B C IgM: NEGATIVE
Hepatitis B Surface Ag: NEGATIVE

## 2015-07-06 NOTE — Progress Notes (Signed)
Internal Medicine Clinic Attending  Case discussed with Dr. Denton Brick soon after the resident saw the patient.  We reviewed the resident's history and exam and pertinent patient test results.  I agree with the assessment, diagnosis, and plan of care documented in the resident's note.  Dr. Denton Brick addressed the status of chronic issues in her assessment and plan.

## 2015-07-19 ENCOUNTER — Other Ambulatory Visit: Payer: Self-pay | Admitting: Internal Medicine

## 2015-07-20 ENCOUNTER — Other Ambulatory Visit: Payer: Self-pay | Admitting: Internal Medicine

## 2015-07-20 DIAGNOSIS — I82409 Acute embolism and thrombosis of unspecified deep veins of unspecified lower extremity: Secondary | ICD-10-CM

## 2015-07-20 DIAGNOSIS — IMO0002 Reserved for concepts with insufficient information to code with codable children: Secondary | ICD-10-CM

## 2015-07-20 DIAGNOSIS — I8290 Acute embolism and thrombosis of unspecified vein: Secondary | ICD-10-CM

## 2015-07-20 NOTE — Telephone Encounter (Signed)
Pt requesting warfarin to be filled @ [facility].

## 2015-07-24 ENCOUNTER — Ambulatory Visit (INDEPENDENT_AMBULATORY_CARE_PROVIDER_SITE_OTHER): Payer: Medicaid Other | Admitting: Pharmacist

## 2015-07-24 DIAGNOSIS — D6859 Other primary thrombophilia: Secondary | ICD-10-CM

## 2015-07-24 DIAGNOSIS — Z7901 Long term (current) use of anticoagulants: Secondary | ICD-10-CM

## 2015-07-24 LAB — POCT INR: INR: 1.6

## 2015-07-24 MED ORDER — WARFARIN SODIUM 5 MG PO TABS: ORAL_TABLET | ORAL | Status: DC

## 2015-07-24 NOTE — Progress Notes (Signed)
Anti-Coagulation Progress Note  Michele Gillespie is a 39 y.o. female who is currently on an anti-coagulation regimen.    RECENT RESULTS: Recent results are below, the most recent result is correlated with a dose of 92.5 mg. per week: Lab Results  Component Value Date   INR 1.60 07/24/2015   INR 1.90 06/26/2015   INR 3.3 05/22/2015    ANTI-COAG DOSE: Anticoagulation Dose Instructions as of 07/24/2015      [name] Tue Wed Thu Fri Sat   New Dose 15 mg 12.5 mg 15 mg 12.5 mg 15 mg 12.5 mg 15 mg       ANTICOAG SUMMARY: Anticoagulation Episode Summary    Current INR goal 2.0-3.0  Next INR check 07/31/2015  INR from last check 1.60! (07/24/2015)  Weekly max dose   Target end date Indefinite  [facility]  Preferred lab   Send INR reminders to    Indications  [name] [D68.59] Long term current use of anticoagulant [Z79.01]        Comments         ANTICOAG TODAY: Anticoagulation Summary as of 07/24/2015    INR goal 2.0-3.0  Selected INR 1.60! (07/24/2015)  Next INR check 07/31/2015  Target end date Indefinite   Indications  [name] [D68.59] Long term current use of anticoagulant [Z79.01]      Anticoagulation Episode Summary    [facility]   Preferred lab    Send INR reminders to    Comments       PATIENT INSTRUCTIONS: Patient Instructions  Patient instructed to take medications as defined in the Anti-coagulation Track section of this encounter.  Patient instructed to take today's dose.  Patient verbalized understanding of these instructions.       FOLLOW-UP Return in 7 days (on 07/31/2015) for Follow up INR at 1030h.  [name], III Pharm.D., CACP

## 2015-07-24 NOTE — Patient Instructions (Signed)
Patient instructed to take medications as defined in the Anti-coagulation Track section of this encounter.  Patient instructed to take today's dose.  Patient verbalized understanding of these instructions.    

## 2015-07-25 NOTE — Progress Notes (Signed)
Indication: Recurrent venous thrombosis. Duration: Lifelong. INR: Below target. Agree with Dr. Groce's assessment and plan. 

## 2015-07-28 ENCOUNTER — Encounter (HOSPITAL_COMMUNITY): Payer: Self-pay | Admitting: Emergency Medicine

## 2015-07-28 ENCOUNTER — Emergency Department (HOSPITAL_COMMUNITY)
Admission: EM | Admit: 2015-07-28 | Discharge: 2015-07-28 | Disposition: A | Discharge status: Home / Self Care | Payer: Medicaid Other | Attending: Emergency Medicine | Admitting: Emergency Medicine

## 2015-07-28 DIAGNOSIS — Z86718 Personal history of other venous thrombosis and embolism: Secondary | ICD-10-CM | POA: Insufficient documentation

## 2015-07-28 DIAGNOSIS — Z720 Tobacco use: Secondary | ICD-10-CM | POA: Diagnosis not present

## 2015-07-28 DIAGNOSIS — Z792 Long term (current) use of antibiotics: Secondary | ICD-10-CM | POA: Insufficient documentation

## 2015-07-28 DIAGNOSIS — J45909 Unspecified asthma, uncomplicated: Secondary | ICD-10-CM | POA: Diagnosis not present

## 2015-07-28 DIAGNOSIS — Z86711 Personal history of pulmonary embolism: Secondary | ICD-10-CM | POA: Diagnosis not present

## 2015-07-28 DIAGNOSIS — G44209 Tension-type headache, unspecified, not intractable: Secondary | ICD-10-CM | POA: Diagnosis not present

## 2015-07-28 DIAGNOSIS — Z7901 Long term (current) use of anticoagulants: Secondary | ICD-10-CM | POA: Insufficient documentation

## 2015-07-28 DIAGNOSIS — Z862 Personal history of diseases of the blood and blood-forming organs and certain disorders involving the immune mechanism: Secondary | ICD-10-CM | POA: Diagnosis not present

## 2015-07-28 DIAGNOSIS — Z8659 Personal history of other mental and behavioral disorders: Secondary | ICD-10-CM | POA: Insufficient documentation

## 2015-07-28 DIAGNOSIS — M542 Cervicalgia: Secondary | ICD-10-CM | POA: Diagnosis present

## 2015-07-28 MED ORDER — ACETAMINOPHEN 325 MG PO TABS
650.0000 mg | ORAL_TABLET | Freq: Once | ORAL | Status: AC
  Administered 2015-07-28: 650 mg via ORAL
  Filled 2015-07-28: qty 2

## 2015-07-28 MED ORDER — CYCLOBENZAPRINE HCL 10 MG PO TABS: 10.0000 mg | ORAL_TABLET | Freq: Two times a day (BID) | ORAL | Status: DC | PRN

## 2015-07-28 NOTE — ED Provider Notes (Signed)
CSN: [id number]     Arrival date & time 07/28/15  0809 History   First MD Initiated Contact with Patient 07/28/15 [id number]     Chief Complaint  Patient presents with  . Headache     (Consider location/radiation/quality/duration/timing/severity/associated sxs/prior Treatment) HPI Comments: Patient is a 39 year old female with a past medical history of hypercoaguable state on coumadin and iron deficiency anemia who presents with a headache for the past 5 weeks. Symptoms started gradually and have been persistent since the onset. The pain is aching and moderate and located in the back of her neck and radiates up to her head. The pain has been constant and unchanged since the onset. She occasionally takes tylenol for the pain which provides relief but she "does not like taking pills." No aggravating factors. No other associated symptoms. Patient was seen in the ED 1 month ago and had an unremarkable CT angiogram of head and neck.   Patient is a 39 y.o. female presenting with headaches.  Headache Associated symptoms: neck pain     Past Medical History  Diagnosis Date  . Pulmonary embolism [facility])  September 07, 2005     her CT angiogram - positive for pulmonary emboli to several branches of the right lower lobe- relatively small clot burden, clear lung; patient started on Coumadin; CT angiogram on January 10, 2006 showed resolution of previously seen pulmonary emboli with minimal basilar atelectasis  . Lung nodule  June 10, 2008     stable tiny noduke noted along the minor fissure of the right lung on CT angio September 11, 09 -  stable for 2 years and consistent with benign disease  . Arm DVT (deep venous thromboembolism), acute [facility])  September 23, 2009, January 05, 2010     Doppler study significant with indeterminant age DVT involving the left upper extremity, Doppler performed January 05, 2010 consistent with acute DVT involving the left upper extremity  . Asthma   . Anemia 2007     microcytic  anemia, baseline hemoglobin 10-11, MCV at baseline 72-77, secondary to iron deficiency  . Schizophrenia ([place])   . Depression   . Leukopenia 2008     unclear etiology baseline WBC  2.8-3.7   Past Surgical History  Procedure Laterality Date  . Cesarean section       History of 5 C-section  . Tubal ligation    . Exploratory laparotomy with abdominal mass excision  02/2005   Family History  Problem Relation Age of Onset  . Hypertension Mother   . Heart disease Mother   . Diabetes Father   . Birth defects Maternal Aunt   . Birth defects Maternal Uncle   . Diabetes Paternal Grandmother    Social History  Substance Use Topics  . Smoking status: Current Every Day Smoker -- 0.50 packs/day for 0 years    Types: Cigarettes  . Smokeless tobacco: Never Used     Comment: about 1/2 pack a day  . Alcohol Use: 0.0 oz/week    0 Standard drinks or equivalent per week   OB History    Gravida Para Term Preterm AB TAB SAB Ectopic Multiple Living   5 4 4  0 0 0 0 0 0 5     Review of Systems  Musculoskeletal: Positive for neck pain.  Neurological: Positive for headaches.  All other systems reviewed and are negative.     Allergies  Review of patient's allergies indicates no known allergies.  Home Medications   Prior to  Admission medications   Medication Sig Start Date End Date Taking? Authorizing Provider  acetaminophen (TYLENOL) 500 MG tablet Take 1,000 mg by mouth every 6 (six) hours as needed for headache.    Historical Provider, MD  albuterol (PROVENTIL HFA;VENTOLIN HFA) 108 (90 BASE) MCG/ACT inhaler Inhale 2 puffs into the lungs every 6 (six) hours as needed for wheezing. For shortness of breath 03/01/14   Ejiroghene E Denton Brick, MD  clindamycin (CLEOCIN) 300 MG capsule Take 300 mg by mouth 2 (two) times daily. For 7 days 9-16 to 9-23    Historical Provider, MD  Loratadine 10 MG CAPS Take 1 capsule (10 mg total) by mouth daily. 06/27/15   Ejiroghene Arlyce Dice, MD  nicotine (NICODERM CQ -  DOSED IN MG/24 HOURS) 21 mg/24hr patch Place 1 patch (21 mg total) onto the skin daily. 06/27/15   Ejiroghene Arlyce Dice, MD  Olopatadine HCl (PATADAY) 0.2 % SOLN PLACE 1 DROP INTO BOTH EYES EVERY DAY 06/27/15   Ejiroghene Arlyce Dice, MD  omeprazole (PRILOSEC) 20 MG capsule Take 1 capsule (20 mg total) by mouth daily. 06/27/15   Ejiroghene Arlyce Dice, MD  warfarin (COUMADIN) 5 MG tablet Take 12.5 mg (2 1/2 tablets) on Mondays, Wednesdays, and Fridays and take 15 mg (3 tablets) on Sundays, Tuesdays, Thursdays, and Saturdays 07/24/15   Oval Linsey, MD   BP 118/96 mmHg  Pulse 79  Temp(Src) 98.1 F (36.7 C) (Oral)  Resp 18  SpO2 100% Physical Exam  Constitutional: She is oriented to person, place, and time. She appears well-developed and well-nourished. No distress.  HENT:  Head: Normocephalic and atraumatic.  Mouth/Throat: Oropharynx is clear and moist. No oropharyngeal exudate.  Eyes: Conjunctivae and EOM are normal. Pupils are equal, round, and reactive to light.  Neck: Normal range of motion.  Cardiovascular: Normal rate and regular rhythm.  Exam reveals no gallop and no friction rub.   No murmur heard. Pulmonary/Chest: Effort normal and breath sounds normal. She has no wheezes. She has no rales. She exhibits no tenderness.  Abdominal: Soft. She exhibits no distension. There is no tenderness. There is no rebound.  Musculoskeletal: Normal range of motion.  Neurological: She is alert and oriented to person, place, and time. No cranial nerve deficit. Coordination normal.  Extremity strength and sensation equal and intact bilaterally. Speech is goal-oriented. Moves limbs without ataxia.   Skin: Skin is warm and dry.  Psychiatric: She has a normal mood and affect. Her behavior is normal.  Nursing note and vitals reviewed.   ED Course  Procedures (including critical care time) Labs Review Labs Reviewed - No data to display  Imaging Review No results found. I have personally reviewed and  evaluated these images and lab results as part of my medical decision-making.   EKG Interpretation None      MDM   Final diagnoses:  Tension-type headache, not intractable, unspecified chronicity pattern   9:01 AM Visual acuity pending. Patient was here 1 month ago with the same symptoms that have been continuous. Patient had an unremarkable CT angio of head and neck. Patient other possibilities for persistent headache will be explored such as worsening eyesight and cervical muscle tension.   Visual acuity normal. Patient will be referred to Neurology for further evaluation. Patient will have flexeril for muscle tension. No neuro deficits. Vitals stable and patient afebrile.     Alvina Chou, PA-C 07/28/15 [place], MD 07/28/15 1531

## 2015-07-28 NOTE — ED Notes (Signed)
Patient states was here last month for the same.  Patient states ongoing headaches "for a while".  Patient states sometimes "i get woke up out of my sleep and have to take a tylenol".  Patient states that the tylenol does help, but "I don't like to take pills".

## 2015-07-28 NOTE — Discharge Instructions (Signed)
Take Flexeril as needed for pain. Apply heat to your neck for muscle tension relief. Follow up with the recommended Neurologist for further evaluation. Return to the ED with worsening or concerning symptoms.

## 2015-08-08 ENCOUNTER — Ambulatory Visit: Payer: Medicaid Other | Admitting: Pharmacist

## 2015-08-14 ENCOUNTER — Ambulatory Visit (INDEPENDENT_AMBULATORY_CARE_PROVIDER_SITE_OTHER): Payer: Medicaid Other | Admitting: Pharmacist

## 2015-08-14 ENCOUNTER — Other Ambulatory Visit (INDEPENDENT_AMBULATORY_CARE_PROVIDER_SITE_OTHER): Payer: Medicaid Other

## 2015-08-14 ENCOUNTER — Other Ambulatory Visit: Payer: Self-pay | Admitting: Internal Medicine

## 2015-08-14 DIAGNOSIS — D6859 Other primary thrombophilia: Secondary | ICD-10-CM | POA: Diagnosis not present

## 2015-08-14 DIAGNOSIS — Z0184 Encounter for antibody response examination: Secondary | ICD-10-CM

## 2015-08-14 DIAGNOSIS — Z7901 Long term (current) use of anticoagulants: Secondary | ICD-10-CM | POA: Diagnosis present

## 2015-08-14 LAB — POCT INR: INR: 2.4

## 2015-08-14 NOTE — Progress Notes (Signed)
Anti-Coagulation Progress Note  Michele Gillespie is a 39 y.o. female who is currently on an anti-coagulation regimen.    RECENT RESULTS: Recent results are below, the most recent result is correlated with a dose of 97.5 mg. per week: Lab Results  Component Value Date   INR 2.40 08/14/2015   INR 1.60 07/24/2015   INR 1.90 06/26/2015    ANTI-COAG DOSE: Anticoagulation Dose Instructions as of 08/14/2015      [name] Tue Wed Thu Fri Sat   New Dose 15 mg 12.5 mg 15 mg 12.5 mg 15 mg 12.5 mg 15 mg       ANTICOAG SUMMARY: Anticoagulation Episode Summary    Current INR goal 2.0-3.0  Next INR check 09/11/2015  INR from last check 2.40 (08/14/2015)  Weekly max dose   Target end date Indefinite  [facility]  Preferred lab   Send INR reminders to    Indications  [place] [D68.59] Long term current use of anticoagulant [Z79.01]        Comments         ANTICOAG TODAY: Anticoagulation Summary as of 08/14/2015    INR goal 2.0-3.0  Selected INR 2.40 (08/14/2015)  Next INR check 09/11/2015  Target end date Indefinite   Indications  [place] [D68.59] Long term current use of anticoagulant [Z79.01]      Anticoagulation Episode Summary    [facility]   Preferred lab    Send INR reminders to    Comments       PATIENT INSTRUCTIONS: Patient Instructions  Patient instructed to take medications as defined in the Anti-coagulation Track section of this encounter.  Patient instructed to take today's dose.  Patient verbalized understanding of these instructions.       FOLLOW-UP Return in about 4 weeks (around 09/11/2015) for Follow up INR at 0915h.  [name], III Pharm.D., CACP

## 2015-08-14 NOTE — Patient Instructions (Signed)
Patient instructed to take medications as defined in the Anti-coagulation Track section of this encounter.  Patient instructed to take today's dose.  Patient verbalized understanding of these instructions.    

## 2015-08-14 NOTE — Progress Notes (Signed)
Pt came today to get her Hep A & B vaccine. PT had an acute hep panel last appt which indicated she did not have acute hepatis. But Hep B S Ab was not tested so not clear if she already has immunity. 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 Checking for surface Ab will determine this. If negative, OK to proceed with vaccination - per info in soc hx, seems to have a high risk profile.

## 2015-08-15 ENCOUNTER — Other Ambulatory Visit: Payer: Self-pay | Admitting: Internal Medicine

## 2015-08-15 LAB — HEPATITIS B SURFACE ANTIBODY,QUALITATIVE: Hep B Surface Ab, Qual: NONREACTIVE

## 2015-08-15 NOTE — Progress Notes (Signed)
Talked with pt and sch to see nurse 08/16/15 11AM.

## 2015-08-16 ENCOUNTER — Ambulatory Visit (INDEPENDENT_AMBULATORY_CARE_PROVIDER_SITE_OTHER): Payer: Medicaid Other | Admitting: *Deleted

## 2015-08-16 DIAGNOSIS — Z23 Encounter for immunization: Secondary | ICD-10-CM | POA: Diagnosis present

## 2015-08-16 MED ORDER — HEPATITIS B VAC RECOMBINANT 5 MCG/0.5ML IJ SUSP: 1.0000 mL | Freq: Once | INTRAMUSCULAR | Status: DC

## 2015-08-21 ENCOUNTER — Telehealth: Payer: Self-pay

## 2015-08-21 ENCOUNTER — Ambulatory Visit: Payer: Self-pay | Admitting: Neurology

## 2015-08-21 NOTE — Telephone Encounter (Signed)
Pt did not show for their appt with Dr. Dohmeier today.  

## 2015-08-22 ENCOUNTER — Encounter: Payer: Self-pay | Admitting: Neurology

## 2015-09-11 ENCOUNTER — Ambulatory Visit (INDEPENDENT_AMBULATORY_CARE_PROVIDER_SITE_OTHER): Payer: Medicaid Other | Admitting: Pharmacist

## 2015-09-11 DIAGNOSIS — Z7901 Long term (current) use of anticoagulants: Secondary | ICD-10-CM

## 2015-09-11 DIAGNOSIS — D6859 Other primary thrombophilia: Secondary | ICD-10-CM

## 2015-09-11 LAB — POCT INR: INR: 2.1

## 2015-09-11 NOTE — Progress Notes (Signed)
Anti-Coagulation Progress Note  KYANA GHIO is a 39 y.o. female who is currently on an anti-coagulation regimen.    RECENT RESULTS: Recent results are below, the most recent result is correlated with a dose of 97.5 mg. per week: Lab Results  Component Value Date   INR 2.10 09/11/2015   INR 2.40 08/14/2015   INR 1.60 07/24/2015    ANTI-COAG DOSE: Anticoagulation Dose Instructions as of 09/11/2015      [name] Tue Wed Thu Fri Sat   New Dose 15 mg 15 mg 15 mg 12.5 mg 15 mg 15 mg 12.5 mg       ANTICOAG SUMMARY: Anticoagulation Episode Summary    Current INR goal 2.0-3.0  Next INR check 10/09/2015  INR from last check 2.10 (09/11/2015)  Weekly max dose   Target end date Indefinite  [facility]  Preferred lab   Send INR reminders to    Indications  [place] [D68.59] Long term current use of anticoagulant [Z79.01]        Comments         ANTICOAG TODAY: Anticoagulation Summary as of 09/11/2015    INR goal 2.0-3.0  Selected INR 2.10 (09/11/2015)  Next INR check 10/09/2015  Target end date Indefinite   Indications  [place] [D68.59] Long term current use of anticoagulant [Z79.01]      Anticoagulation Episode Summary    [facility]   Preferred lab    Send INR reminders to    Comments       PATIENT INSTRUCTIONS: Patient Instructions  Patient instructed to take medications as defined in the Anti-coagulation Track section of this encounter.  Patient instructed to take today's dose.  Patient verbalized understanding of these instructions.       FOLLOW-UP Return in 4 weeks (on 10/09/2015) for Follow up INR at 0945h.  [name], III Pharm.D., CACP

## 2015-09-11 NOTE — Patient Instructions (Signed)
Patient instructed to take medications as defined in the Anti-coagulation Track section of this encounter.  Patient instructed to take today's dose.  Patient verbalized understanding of these instructions.    

## 2015-09-13 ENCOUNTER — Ambulatory Visit: Payer: Medicaid Other

## 2015-09-14 NOTE — Progress Notes (Signed)
I have reviewed Dr. Gladstone Pih note.  Patient is on [facility] for hypercoagulable state.  INR low, coumadin increased.

## 2015-10-03 ENCOUNTER — Ambulatory Visit: Payer: Medicaid Other

## 2015-10-03 DIAGNOSIS — Z0000 Encounter for general adult medical examination without abnormal findings: Secondary | ICD-10-CM

## 2015-10-03 MED ORDER — HEPATITIS B VAC RECOMBINANT 5 MCG/0.5ML IJ SUSP
1.0000 mL | Freq: Once | INTRAMUSCULAR | Status: AC
  Administered 2015-10-03: 10 ug via INTRAMUSCULAR

## 2015-10-06 ENCOUNTER — Other Ambulatory Visit: Payer: Self-pay | Admitting: Internal Medicine

## 2015-10-06 DIAGNOSIS — I8290 Acute embolism and thrombosis of unspecified vein: Secondary | ICD-10-CM

## 2015-10-06 DIAGNOSIS — IMO0002 Reserved for concepts with insufficient information to code with codable children: Secondary | ICD-10-CM

## 2015-10-06 MED ORDER — WARFARIN SODIUM 5 MG PO TABS
ORAL_TABLET | ORAL | Status: DC
Start: 2015-10-06 — End: 2016-02-06

## 2015-10-06 NOTE — Telephone Encounter (Signed)
Patient was called today to rescheduled coumadin appt from 9:30 am to 11 am on 10/09/2015.  States she needs refill on her warfarin 5 mg.  Does not have enough to last the weekend.  Please call when she is able to pick up meds from pharmacy.

## 2015-10-09 ENCOUNTER — Ambulatory Visit: Payer: Medicaid Other

## 2015-10-12 ENCOUNTER — Encounter (HOSPITAL_COMMUNITY): Payer: Self-pay

## 2015-10-12 DIAGNOSIS — J45909 Unspecified asthma, uncomplicated: Secondary | ICD-10-CM | POA: Insufficient documentation

## 2015-10-12 DIAGNOSIS — Z86711 Personal history of pulmonary embolism: Secondary | ICD-10-CM | POA: Diagnosis not present

## 2015-10-12 DIAGNOSIS — F17210 Nicotine dependence, cigarettes, uncomplicated: Secondary | ICD-10-CM

## 2015-10-12 DIAGNOSIS — Z86718 Personal history of other venous thrombosis and embolism: Secondary | ICD-10-CM | POA: Diagnosis not present

## 2015-10-12 DIAGNOSIS — Z3202 Encounter for pregnancy test, result negative: Secondary | ICD-10-CM | POA: Diagnosis not present

## 2015-10-12 DIAGNOSIS — Z862 Personal history of diseases of the blood and blood-forming organs and certain disorders involving the immune mechanism: Secondary | ICD-10-CM | POA: Diagnosis not present

## 2015-10-12 DIAGNOSIS — Z8659 Personal history of other mental and behavioral disorders: Secondary | ICD-10-CM | POA: Diagnosis not present

## 2015-10-12 DIAGNOSIS — B373 Candidiasis of vulva and vagina: Secondary | ICD-10-CM | POA: Diagnosis not present

## 2015-10-12 DIAGNOSIS — N760 Acute vaginitis: Secondary | ICD-10-CM | POA: Insufficient documentation

## 2015-10-12 DIAGNOSIS — Z79899 Other long term (current) drug therapy: Secondary | ICD-10-CM | POA: Diagnosis not present

## 2015-10-12 DIAGNOSIS — N898 Other specified noninflammatory disorders of vagina: Secondary | ICD-10-CM | POA: Diagnosis present

## 2015-10-12 NOTE — ED Notes (Signed)
No response for reassessment.  

## 2015-10-12 NOTE — ED Notes (Signed)
Pt states she is not supposed to use perfumed soaps and used the wrong one and it usually causes a yeast infection. Has been having irritation and itching to the vaginal area the past 2 days.

## 2015-10-13 ENCOUNTER — Emergency Department (HOSPITAL_COMMUNITY)
Admission: EM | Admit: 2015-10-13 | Discharge: 2015-10-13 | Discharge status: Against Medical Advice | Payer: Medicaid Other | Source: Home / Self Care

## 2015-10-13 ENCOUNTER — Encounter (HOSPITAL_COMMUNITY): Payer: Self-pay

## 2015-10-13 ENCOUNTER — Emergency Department (HOSPITAL_COMMUNITY)
Admission: EM | Admit: 2015-10-13 | Discharge: 2015-10-13 | Disposition: A | Discharge status: Home / Self Care | Payer: Medicaid Other | Attending: Emergency Medicine | Admitting: Emergency Medicine

## 2015-10-13 ENCOUNTER — Telehealth: Payer: Self-pay | Admitting: Pharmacist

## 2015-10-13 DIAGNOSIS — B373 Candidiasis of vulva and vagina: Secondary | ICD-10-CM | POA: Insufficient documentation

## 2015-10-13 DIAGNOSIS — Z86711 Personal history of pulmonary embolism: Secondary | ICD-10-CM | POA: Insufficient documentation

## 2015-10-13 DIAGNOSIS — Z8659 Personal history of other mental and behavioral disorders: Secondary | ICD-10-CM | POA: Insufficient documentation

## 2015-10-13 DIAGNOSIS — Z86718 Personal history of other venous thrombosis and embolism: Secondary | ICD-10-CM | POA: Insufficient documentation

## 2015-10-13 DIAGNOSIS — J45909 Unspecified asthma, uncomplicated: Secondary | ICD-10-CM | POA: Insufficient documentation

## 2015-10-13 DIAGNOSIS — B3731 Acute candidiasis of vulva and vagina: Secondary | ICD-10-CM

## 2015-10-13 DIAGNOSIS — Z3202 Encounter for pregnancy test, result negative: Secondary | ICD-10-CM | POA: Insufficient documentation

## 2015-10-13 DIAGNOSIS — F17210 Nicotine dependence, cigarettes, uncomplicated: Secondary | ICD-10-CM | POA: Insufficient documentation

## 2015-10-13 DIAGNOSIS — Z862 Personal history of diseases of the blood and blood-forming organs and certain disorders involving the immune mechanism: Secondary | ICD-10-CM | POA: Insufficient documentation

## 2015-10-13 DIAGNOSIS — Z79899 Other long term (current) drug therapy: Secondary | ICD-10-CM | POA: Insufficient documentation

## 2015-10-13 LAB — WET PREP, GENITAL
CLUE CELLS WET PREP: NONE SEEN
Sperm: NONE SEEN
TRICH WET PREP: NONE SEEN

## 2015-10-13 LAB — URINALYSIS, ROUTINE W REFLEX MICROSCOPIC
Bilirubin Urine: NEGATIVE
GLUCOSE, UA: NEGATIVE mg/dL
Ketones, ur: NEGATIVE mg/dL
Nitrite: NEGATIVE
PH: 5.5 (ref 5.0–8.0)
Protein, ur: NEGATIVE mg/dL
SPECIFIC GRAVITY, URINE: 1.021 (ref 1.005–1.030)

## 2015-10-13 LAB — URINE MICROSCOPIC-ADD ON

## 2015-10-13 LAB — PROTIME-INR
INR: 1.46 (ref 0.00–1.49)
PROTHROMBIN TIME: 17.8 s — AB (ref 11.6–15.2)

## 2015-10-13 LAB — POC URINE PREG, ED: Preg Test, Ur: NEGATIVE

## 2015-10-13 MED ORDER — FLUCONAZOLE 100 MG PO TABS
150.0000 mg | ORAL_TABLET | Freq: Once | ORAL | Status: AC
  Administered 2015-10-13: 150 mg via ORAL
  Filled 2015-10-13: qty 2

## 2015-10-13 MED ORDER — FLUCONAZOLE 150 MG PO TABS: 150.0000 mg | ORAL_TABLET | Freq: Once | ORAL | Status: AC

## 2015-10-13 NOTE — Telephone Encounter (Signed)
Contacted patient to clarify warfarin monitoring. Patient states INR was 1.4 during ER visit today when she was instructed to increase to 3 tablets (15 mg) daily. Agree with this plan until patient follow up appointment on Tuesday, 10/17/15. Patient verbalized understanding and was advised to seek care if any major concerns arise (was also informed the clinic will be closed 10/16/15).

## 2015-10-13 NOTE — ED Notes (Signed)
Patient here with vaginal discharge x 4 days, no other associated symptoms

## 2015-10-13 NOTE — ED Notes (Signed)
Patient to [facility] ED with C/O Vaginal yeast infection. States that she mistakenly used a perfumed soap which caused her skin to itch and caused her to have a yeast infection.  All began 4 days ago.  States that her skin does not itch any longer but the yeast infection persists.  Also states that she needs her INR checked.  States that she missed her appointment because of the snow.

## 2015-10-13 NOTE — ED Provider Notes (Signed)
CSN: [id number]     Arrival date & time 10/13/15  [id number] History   First MD Initiated Contact with Patient 10/13/15 0914     Chief Complaint  Patient presents with  . Vaginal Discharge     (Consider location/radiation/quality/duration/timing/severity/associated sxs/prior Treatment) HPI She presents of vaginal discharge for the last 4 days. She states she's had irritation of the vulva and some dysuria. Denies any abdominal pain. Denies any recent sexual partners. No fever or chills. States she's recently used perfumed soap which has caused yeast infections in the past. Patient is on Coumadin for PE. She is asking to have her INR checked. Denies any chest pain or shortness of breath. Past Medical History  Diagnosis Date  . Pulmonary embolism [facility])  September 07, 2005     her CT angiogram - positive for pulmonary emboli to several branches of the right lower lobe- relatively small clot burden, clear lung; patient started on Coumadin; CT angiogram on January 10, 2006 showed resolution of previously seen pulmonary emboli with minimal basilar atelectasis  . Lung nodule  June 10, 2008     stable tiny noduke noted along the minor fissure of the right lung on CT angio September 11, 09 -  stable for 2 years and consistent with benign disease  . Arm DVT (deep venous thromboembolism), acute [facility])  September 23, 2009, January 05, 2010     Doppler study significant with indeterminant age DVT involving the left upper extremity, Doppler performed January 05, 2010 consistent with acute DVT involving the left upper extremity  . Asthma   . Anemia 2007     microcytic anemia, baseline hemoglobin 10-11, MCV at baseline 72-77, secondary to iron deficiency  . Schizophrenia ([place])   . Depression   . Leukopenia 2008     unclear etiology baseline WBC  2.8-3.7   Past Surgical History  Procedure Laterality Date  . Cesarean section       History of 5 C-section  . Tubal ligation    . Exploratory laparotomy with abdominal  mass excision  02/2005   Family History  Problem Relation Age of Onset  . Hypertension Mother   . Heart disease Mother   . Diabetes Father   . Birth defects Maternal Aunt   . Birth defects Maternal Uncle   . Diabetes Paternal Grandmother    Social History  Substance Use Topics  . Smoking status: Current Every Day Smoker -- 0.50 packs/day for 0 years    Types: Cigarettes  . Smokeless tobacco: Never Used     Comment: about 1/2 pack a day  . Alcohol Use: 0.0 oz/week    0 Standard drinks or equivalent per week   OB History    Gravida Para Term Preterm AB TAB SAB Ectopic Multiple Living   5 4 4  0 0 0 0 0 0 5     Review of Systems  Constitutional: Negative for fever and chills.  Respiratory: Negative for shortness of breath.   Cardiovascular: Negative for chest pain and leg swelling.  Gastrointestinal: Negative for nausea, vomiting and abdominal pain.  Genitourinary: Positive for dysuria and vaginal discharge. Negative for frequency, hematuria, flank pain, vaginal bleeding and pelvic pain.  Musculoskeletal: Negative for myalgias and back pain.  Skin: Negative for rash and wound.  Neurological: Negative for dizziness, weakness, light-headedness, numbness and headaches.  All other systems reviewed and are negative.     Allergies  Review of patient's allergies indicates no known allergies.  Home Medications   Prior  to Admission medications   Medication Sig Start Date End Date Taking? Authorizing Provider  acetaminophen (TYLENOL) 500 MG tablet Take 1,000 mg by mouth every 6 (six) hours as needed for headache.   Yes Historical Provider, MD  albuterol (PROVENTIL HFA;VENTOLIN HFA) 108 (90 BASE) MCG/ACT inhaler Inhale 2 puffs into the lungs every 6 (six) hours as needed for wheezing. For shortness of breath 03/01/14  Yes Ejiroghene E Emokpae, MD  Loratadine 10 MG CAPS Take 1 capsule (10 mg total) by mouth daily. 06/27/15  Yes Ejiroghene E Emokpae, MD  Olopatadine HCl (PATADAY) 0.2 %  SOLN PLACE 1 DROP INTO BOTH EYES EVERY DAY 06/27/15  Yes Ejiroghene Arlyce Dice, MD  warfarin (COUMADIN) 5 MG tablet Take 12.5 mg (2 1/2 tablets) on Wednesdays, and Saturdays and take 15 mg (3 tablets) on all other days. Patient taking differently: Take 12.5-15 mg by mouth daily. Take 12.5 mg (2 1/2 tablets) on Wednesdays, and Saturdays and take 15 mg (3 tablets) on all other days. 10/06/15  Yes Ejiroghene Arlyce Dice, MD  cyclobenzaprine (FLEXERIL) 10 MG tablet Take 1 tablet (10 mg total) by mouth 2 (two) times daily as needed for muscle spasms. Patient not taking: Reported on 10/13/2015 07/28/15   Alvina Chou, PA-C  fluconazole (DIFLUCAN) 150 MG tablet Take 1 tablet (150 mg total) by mouth once. If symptoms persist for 3 days, take 150 mg of fluconazole 10/13/15 10/20/15  Julianne Rice, MD  nicotine (NICODERM CQ - DOSED IN MG/24 HOURS) 21 mg/24hr patch Place 1 patch (21 mg total) onto the skin daily. Patient not taking: Reported on 10/13/2015 06/27/15   Ejiroghene Arlyce Dice, MD  omeprazole (PRILOSEC) 20 MG capsule Take 1 capsule (20 mg total) by mouth daily. 06/27/15   Ejiroghene E Emokpae, MD   BP 133/100 mmHg  Pulse 74  Temp(Src) 98.8 F (37.1 C) (Oral)  Resp 18  SpO2 99%  LMP 09/06/2015 Physical Exam  Constitutional: She is oriented to person, place, and time. She appears well-developed and well-nourished. No distress.  HENT:  Head: Normocephalic and atraumatic.  Mouth/Throat: Oropharynx is clear and moist.  Eyes: EOM are normal. Pupils are equal, round, and reactive to light.  Neck: Normal range of motion. Neck supple.  Cardiovascular: Normal rate and regular rhythm.  Exam reveals no gallop and no friction rub.   No murmur heard. Pulmonary/Chest: Effort normal and breath sounds normal. No respiratory distress. She has no wheezes. She has no rales. She exhibits no tenderness.  Abdominal: Soft. Bowel sounds are normal. She exhibits no distension and no mass. There is no tenderness. There is  no rebound and no guarding.  Genitourinary: Vaginal discharge found.  Thick white vaginal discharge in vault. No cervical motion tenderness. No adnexal tenderness or fullness.  Musculoskeletal: Normal range of motion. She exhibits no edema or tenderness.  No CVA tenderness bilaterally. No lower extremity swelling or pain.  Neurological: She is alert and oriented to person, place, and time.  Moving all extremities without deficit. Sensation is fully intact.  Skin: Skin is warm and dry. No rash noted. No erythema.  Psychiatric: She has a normal mood and affect. Her behavior is normal.  Nursing note and vitals reviewed.   ED Course  Procedures (including critical care time) Labs Review Labs Reviewed  WET PREP, GENITAL - Abnormal; Notable for the following:    Yeast Wet Prep HPF POC PRESENT (*)    WBC, Wet Prep HPF POC TOO NUMEROUS TO COUNT (*)    All other  components within normal limits  URINALYSIS, ROUTINE W REFLEX MICROSCOPIC (NOT AT [facility]) - Abnormal; Notable for the following:    APPearance HAZY (*)    Hgb urine dipstick SMALL (*)    Leukocytes, UA LARGE (*)    All other components within normal limits  PROTIME-INR - Abnormal; Notable for the following:    Prothrombin Time 17.8 (*)    All other components within normal limits  URINE MICROSCOPIC-ADD ON - Abnormal; Notable for the following:    Squamous Epithelial / LPF TOO NUMEROUS TO COUNT (*)    Bacteria, UA FEW (*)    All other components within normal limits  POC URINE PREG, ED  GC/CHLAMYDIA PROBE AMP ([place]) NOT AT [facility]    Imaging Review No results found. I have personally reviewed and evaluated these images and lab results as part of my medical decision-making.   EKG Interpretation None      MDM   Final diagnoses:  Candidal vaginitis   Treated with Diflucan. Discuss with her primary physician who will see her in the clinic and follow-up her INR. Return precautions have been given.     Julianne Rice, MD 10/13/15 [phone]

## 2015-10-13 NOTE — ED Notes (Signed)
Pts name called to go to a room no answer 

## 2015-10-13 NOTE — Discharge Instructions (Signed)
Take 3 tablets daily (15 mg) of your warfarin until you are reevaluated by your primary physician. Call and make an appointment to be seen next week for recheck of your INR. Turn immediately for any shortness of breath or chest pain.  If you continue to have vaginal discharge in 3 days, take another dose of the fluconazole. If this persists, despite second dose, follow up with your primary doctor.  Monilial Vaginitis Vaginitis in a soreness, swelling and redness (inflammation) of the vagina and vulva. Monilial vaginitis is not a sexually transmitted infection. CAUSES  Yeast vaginitis is caused by yeast (candida) that is normally found in your vagina. With a yeast infection, the candida has overgrown in number to a point that upsets the chemical balance. SYMPTOMS   White, thick vaginal discharge.  Swelling, itching, redness and irritation of the vagina and possibly the lips of the vagina (vulva).  Burning or painful urination.  Painful intercourse. DIAGNOSIS  Things that may contribute to monilial vaginitis are:  Postmenopausal and virginal states.  Pregnancy.  Infections.  Being tired, sick or stressed, especially if you had monilial vaginitis in the past.  Diabetes. Good control will help lower the chance.  Birth control pills.  Tight fitting garments.  Using bubble bath, feminine sprays, douches or deodorant tampons.  Taking certain medications that kill germs (antibiotics).  Sporadic recurrence can occur if you become ill. TREATMENT  Your caregiver will give you medication.  There are several kinds of anti monilial vaginal creams and suppositories specific for monilial vaginitis. For recurrent yeast infections, use a suppository or cream in the vagina 2 times a week, or as directed.  Anti-monilial or steroid cream for the itching or irritation of the vulva may also be used. Get your caregiver's permission.  Painting the vagina with methylene blue solution may help if  the monilial cream does not work.  Eating yogurt may help prevent monilial vaginitis. HOME CARE INSTRUCTIONS   Finish all medication as prescribed.  Do not have sex until treatment is completed or after your caregiver tells you it is okay.  Take warm sitz baths.  Do not douche.  Do not use tampons, especially scented ones.  Wear cotton underwear.  Avoid tight pants and panty hose.  Tell your sexual partner that you have a yeast infection. They should go to their caregiver if they have symptoms such as mild rash or itching.  Your sexual partner should be treated as well if your infection is difficult to eliminate.  Practice safer sex. Use condoms.  Some vaginal medications cause latex condoms to fail. Vaginal medications that harm condoms are:  Cleocin cream.  Butoconazole (Femstat).  Terconazole (Terazol) vaginal suppository.  Miconazole (Monistat) (may be purchased over the counter). SEEK MEDICAL CARE IF:   You have a temperature by mouth above 102 F (38.9 C).  The infection is getting worse after 2 days of treatment.  The infection is not getting better after 3 days of treatment.  You develop blisters in or around your vagina.  You develop vaginal bleeding, and it is not your menstrual period.  You have pain when you urinate.  You develop intestinal problems.  You have pain with sexual intercourse.   This information is not intended to replace advice given to you by your health care provider. Make sure you discuss any questions you have with your health care provider.   Document Released: 06/26/2005 Document Revised: 12/09/2011 Document Reviewed: 03/20/2015 Elsevier Interactive Patient Education [employer].

## 2015-10-14 ENCOUNTER — Emergency Department (HOSPITAL_COMMUNITY)
Admission: EM | Admit: 2015-10-14 | Discharge: 2015-10-14 | Disposition: A | Discharge status: Home / Self Care | Payer: Medicaid Other | Attending: Emergency Medicine | Admitting: Emergency Medicine

## 2015-10-14 ENCOUNTER — Encounter (HOSPITAL_COMMUNITY): Payer: Self-pay | Admitting: Emergency Medicine

## 2015-10-14 DIAGNOSIS — Z79899 Other long term (current) drug therapy: Secondary | ICD-10-CM | POA: Insufficient documentation

## 2015-10-14 DIAGNOSIS — F329 Major depressive disorder, single episode, unspecified: Secondary | ICD-10-CM | POA: Insufficient documentation

## 2015-10-14 DIAGNOSIS — J45909 Unspecified asthma, uncomplicated: Secondary | ICD-10-CM | POA: Diagnosis not present

## 2015-10-14 DIAGNOSIS — Z7901 Long term (current) use of anticoagulants: Secondary | ICD-10-CM | POA: Insufficient documentation

## 2015-10-14 DIAGNOSIS — Z86711 Personal history of pulmonary embolism: Secondary | ICD-10-CM | POA: Insufficient documentation

## 2015-10-14 DIAGNOSIS — K0889 Other specified disorders of teeth and supporting structures: Secondary | ICD-10-CM | POA: Insufficient documentation

## 2015-10-14 DIAGNOSIS — Z86718 Personal history of other venous thrombosis and embolism: Secondary | ICD-10-CM | POA: Insufficient documentation

## 2015-10-14 DIAGNOSIS — F209 Schizophrenia, unspecified: Secondary | ICD-10-CM | POA: Diagnosis not present

## 2015-10-14 DIAGNOSIS — Z862 Personal history of diseases of the blood and blood-forming organs and certain disorders involving the immune mechanism: Secondary | ICD-10-CM | POA: Diagnosis not present

## 2015-10-14 DIAGNOSIS — F17210 Nicotine dependence, cigarettes, uncomplicated: Secondary | ICD-10-CM | POA: Insufficient documentation

## 2015-10-14 NOTE — Discharge Instructions (Signed)
Use ora gel for pain and take tylenol follow up with your dentist.  Also follow up with your doctor to recheck your blood pressure

## 2015-10-14 NOTE — ED Provider Notes (Signed)
CSN: [id number]     Arrival date & time 10/14/15  2159 History   First MD Initiated Contact with Patient 10/14/15 2211     Chief Complaint  Patient presents with  . Dental Pain     (Consider location/radiation/quality/duration/timing/severity/associated sxs/prior Treatment) HPI Michele Gillespie is a 40 y.o. female who presents to the ED with dental soreness. She reports that she bought a box of cookies and when she bit down into one of them she felt something hard and it caused her Right lower first molar to have pain. She denies any other problems tonight. She is followed by the [facility] s/p PE and has an appointment in 3 days for a BP check.   Past Medical History  Diagnosis Date  . Pulmonary embolism [facility])  September 07, 2005     her CT angiogram - positive for pulmonary emboli to several branches of the right lower lobe- relatively small clot burden, clear lung; patient started on Coumadin; CT angiogram on January 10, 2006 showed resolution of previously seen pulmonary emboli with minimal basilar atelectasis  . Lung nodule  June 10, 2008     stable tiny noduke noted along the minor fissure of the right lung on CT angio September 11, 09 -  stable for 2 years and consistent with benign disease  . Arm DVT (deep venous thromboembolism), acute [facility])  September 23, 2009, January 05, 2010     Doppler study significant with indeterminant age DVT involving the left upper extremity, Doppler performed January 05, 2010 consistent with acute DVT involving the left upper extremity  . Asthma   . Anemia 2007     microcytic anemia, baseline hemoglobin 10-11, MCV at baseline 72-77, secondary to iron deficiency  . Schizophrenia ([place])   . Depression   . Leukopenia 2008     unclear etiology baseline WBC  2.8-3.7   Past Surgical History  Procedure Laterality Date  . Cesarean section       History of 5 C-section  . Tubal ligation    . Exploratory laparotomy with abdominal mass excision  02/2005    Family History  Problem Relation Age of Onset  . Hypertension Mother   . Heart disease Mother   . Diabetes Father   . Birth defects Maternal Aunt   . Birth defects Maternal Uncle   . Diabetes Paternal Grandmother    Social History  Substance Use Topics  . Smoking status: Current Every Day Smoker -- 0.50 packs/day for 0 years    Types: Cigarettes  . Smokeless tobacco: Never Used     Comment: about 1/2 pack a day  . Alcohol Use: 0.0 oz/week    0 Standard drinks or equivalent per week   OB History    Gravida Para Term Preterm AB TAB SAB Ectopic Multiple Living   5 4 4  0 0 0 0 0 0 5     Review of Systems Negative except as stated in HPI   Allergies  Review of patient's allergies indicates no known allergies.  Home Medications   Prior to Admission medications   Medication Sig Start Date End Date Taking? Authorizing Provider  acetaminophen (TYLENOL) 500 MG tablet Take 1,000 mg by mouth every 6 (six) hours as needed for headache.    Historical Provider, MD  albuterol (PROVENTIL HFA;VENTOLIN HFA) 108 (90 BASE) MCG/ACT inhaler Inhale 2 puffs into the lungs every 6 (six) hours as needed for wheezing. For shortness of breath 03/01/14   Ejiroghene E Emokpae,  MD  cyclobenzaprine (FLEXERIL) 10 MG tablet Take 1 tablet (10 mg total) by mouth 2 (two) times daily as needed for muscle spasms. Patient not taking: Reported on 10/13/2015 07/28/15   Alvina Chou, PA-C  fluconazole (DIFLUCAN) 150 MG tablet Take 1 tablet (150 mg total) by mouth once. If symptoms persist for 3 days, take 150 mg of fluconazole 10/13/15 10/20/15  Julianne Rice, MD  Loratadine 10 MG CAPS Take 1 capsule (10 mg total) by mouth daily. 06/27/15   Ejiroghene Arlyce Dice, MD  nicotine (NICODERM CQ - DOSED IN MG/24 HOURS) 21 mg/24hr patch Place 1 patch (21 mg total) onto the skin daily. Patient not taking: Reported on 10/13/2015 06/27/15   Ejiroghene Arlyce Dice, MD  Olopatadine HCl (PATADAY) 0.2 % SOLN PLACE 1 DROP INTO BOTH  EYES EVERY DAY 06/27/15   Ejiroghene Arlyce Dice, MD  omeprazole (PRILOSEC) 20 MG capsule Take 1 capsule (20 mg total) by mouth daily. 06/27/15   Ejiroghene Arlyce Dice, MD  warfarin (COUMADIN) 5 MG tablet Take 12.5 mg (2 1/2 tablets) on Wednesdays, and Saturdays and take 15 mg (3 tablets) on all other days. Patient taking differently: Take 12.5-15 mg by mouth daily. Take 12.5 mg (2 1/2 tablets) on Wednesdays, and Saturdays and take 15 mg (3 tablets) on all other days. 10/06/15   Ejiroghene E Emokpae, MD   BP 149/106 mmHg  Pulse 75  Temp(Src) 98.3 F (36.8 C) (Oral)  Resp 20  SpO2 97%  LMP 09/06/2015 Physical Exam  Constitutional: She is oriented to person, place, and time. She appears well-developed and well-nourished. No distress.  HENT:  Head: Normocephalic.  Nose: Nose normal.  Mouth/Throat: Uvula is midline and mucous membranes are normal.    Mild tenderness to the lower right first molar with palpation. No erythema noted, no broken teeth. No gum laceration.  Eyes: Conjunctivae and EOM are normal.  Neck: Normal range of motion. Neck supple.  Cardiovascular: Normal rate and regular rhythm.   Pulmonary/Chest: Effort normal and breath sounds normal.  Abdominal: Soft. There is no tenderness.  Musculoskeletal: Normal range of motion.  Lymphadenopathy:    She has no cervical adenopathy.  Neurological: She is alert and oriented to person, place, and time. No cranial nerve deficit.  Skin: Skin is warm and dry.  Psychiatric: She has a normal mood and affect. Her behavior is normal.  Nursing note and vitals reviewed.   ED Course  Procedures   MDM  40 y.o. female with tenderness to the right lower first molar stable for d/c without signs of infection or dental injury. She will follow up with her dentist. She will also follow up with her PCP this week as scheduled for her BP check. She will return here as needed for any problems.   Final diagnoses:  Pain, dental       Ashley Murrain,  NP 10/14/15 [place], MD 10/14/15 2308

## 2015-10-14 NOTE — ED Notes (Signed)
Pt bit down on something hard and felt pain in her back tooth. Teeth intact.

## 2015-10-14 NOTE — ED Notes (Signed)
Pt ambulates independently and with steady gait at time of discharge. Discharge instructions and follow up information reviewed with patient. No other questions or concerns voiced at this time.  

## 2015-10-16 LAB — GC/CHLAMYDIA PROBE AMP (~~LOC~~) NOT AT ARMC
CHLAMYDIA, DNA PROBE: NEGATIVE
Neisseria Gonorrhea: NEGATIVE

## 2015-10-17 ENCOUNTER — Ambulatory Visit (INDEPENDENT_AMBULATORY_CARE_PROVIDER_SITE_OTHER): Payer: Medicaid Other | Admitting: Pharmacist

## 2015-10-17 VITALS — BP 132/103 | HR 75 | Wt 217.0 lb

## 2015-10-17 DIAGNOSIS — Z7901 Long term (current) use of anticoagulants: Secondary | ICD-10-CM

## 2015-10-17 DIAGNOSIS — D6859 Other primary thrombophilia: Secondary | ICD-10-CM

## 2015-10-17 LAB — POCT INR: INR: 2.7

## 2015-10-18 ENCOUNTER — Other Ambulatory Visit: Payer: Self-pay | Admitting: Pharmacist

## 2015-10-18 DIAGNOSIS — F17200 Nicotine dependence, unspecified, uncomplicated: Secondary | ICD-10-CM

## 2015-10-18 NOTE — Patient Instructions (Signed)
Patient educated about medication as defined in this encounter and verbalized understanding by repeating back instructions provided.   

## 2015-10-18 NOTE — Progress Notes (Signed)
Indication: Unspecified hypercoagulable state Duration: Apparently lifelong INR: Below target.  Ms. Makhlouf/Dr. Kim's assessment and plan were reviewed and I agree with their documentation.

## 2015-10-18 NOTE — Progress Notes (Addendum)
Patient ID: Michele Gillespie, female   DOB: 1976-04-25, 40 y.o.   MRN: [id number] Anticoagulation Management Michele Gillespie is a 40 y.o. female who reports to the clinic for monitoring of warfarin treatment.    Indication: DVT PE Duration: indefinite  Anticoagulation Clinic Visit History: Patient does not report signs/symptoms of bleeding or thromboembolism Other recent changes: None  Anticoagulation Episode Summary    Current INR goal 2.0-3.0  Next INR check 10/20/2015  INR from last check 2.7 (10/17/2015)  Most recent INR 1.46! (10/13/2015)  Weekly max dose   Target end date Indefinite  [facility]  Preferred lab   Send INR reminders to    Indications  HYPERCOAGULABLE STATE PRIMARY [D68.59] Long term current use of anticoagulant [Z79.01]        Comments        ASSESSMENT Recent Results: Recent results are below, the most recent result is correlated with a dose of 105 mg per week: Lab Results  Component Value Date   INR 2.7 10/17/2015   INR 1.46 10/13/2015   INR 2.10 09/11/2015   INR 2.40 08/14/2015    INR today: Therapeutic  Anticoagulation Dosing: INR as of 10/17/2015 and Previous Dosing Information    INR Dt INR Goal [employer] Sun Mon Tue Wed Thu Fri Sat   11/17/2015 2.7 2.0-3.0 105 mg 15 mg 15 mg 15 mg 15 mg 15 mg 15 mg 15 mg    Anticoagulation Dose Instructions as of 10/17/2015      Total Sun Mon Tue Wed Thu Fri Sat   New Dose 102.5 mg 15 mg 15 mg 12.5 mg 15 mg 15 mg 15 mg 15 mg     (5 mg x 3)  (5 mg x 3)  (5 mg x 2.5)  (5 mg x 3)  (5 mg x 3)  (5 mg x 3)  (5 mg x 3)                           PLAN Weekly dose was decreased by 10% to 87.5 mg per week  There are no Patient Instructions on file for this visit.  Follow-up Will return for follow-up on 10/20/15.  Tanya Makhlouf  45 minutes spent face-to-face with the patient during the encounter. 75% of time spent on education. 25% of time was spent on assessment and  plan.  Patient was seen in clinic with Juanell Fairly, PharmD candidate. I agree with the assessment and plan of care documented by [facility].

## 2015-10-19 ENCOUNTER — Telehealth: Payer: Self-pay | Admitting: Pharmacist

## 2015-10-19 NOTE — Telephone Encounter (Signed)
Call to patient to confirm appointment for 10/19/14 at 11:00 lmtcb

## 2015-10-20 ENCOUNTER — Ambulatory Visit (INDEPENDENT_AMBULATORY_CARE_PROVIDER_SITE_OTHER): Payer: Medicaid Other | Admitting: Pharmacist

## 2015-10-20 DIAGNOSIS — Z7901 Long term (current) use of anticoagulants: Secondary | ICD-10-CM

## 2015-10-20 DIAGNOSIS — D6859 Other primary thrombophilia: Secondary | ICD-10-CM | POA: Diagnosis not present

## 2015-10-20 LAB — POCT INR: INR: 3.4

## 2015-10-20 MED ORDER — NICOTINE 7 MG/24HR TD PT24: MEDICATED_PATCH | TRANSDERMAL | Status: DC

## 2015-10-20 MED ORDER — NICOTINE 14 MG/24HR TD PT24: MEDICATED_PATCH | TRANSDERMAL | Status: DC

## 2015-10-20 MED ORDER — NICOTINE POLACRILEX 2 MG MT GUM: CHEWING_GUM | OROMUCOSAL | Status: DC

## 2015-10-20 MED ORDER — NICOTINE 21 MG/24HR TD PT24: MEDICATED_PATCH | TRANSDERMAL | Status: DC

## 2015-10-20 NOTE — Patient Instructions (Signed)
Patient educated about medication as defined in this encounter and verbalized understanding by repeating back instructions provided.   

## 2015-10-20 NOTE — Progress Notes (Signed)
Anticoagulation Management Michele Gillespie is a 40 y.o. female who reports to the clinic for monitoring of warfarin treatment.    Indication: hypercoagulable state Duration: indefinite  Anticoagulation Clinic Visit History: Anticoagulation Episode Summary    Current INR goal 2.0-3.0  Next INR check 10/23/2015  INR from last check 3.4! 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] Long term current use of anticoagulant [Z79.01]        Comments        ASSESSMENT Recent Results: Recent results are below, the most recent result is correlated with a dose of 102.5 mg per week: Lab Results  Component Value Date   INR 3.4 10/20/2015   INR 2.7 10/17/2015   INR 1.46 10/13/2015   INR today: Supratherapeutic  Anticoagulation Dosing: INR as of 10/20/2015 and Previous Dosing Information    INR Dt INR Goal [employer] Sun Mon Tue Wed Thu Fri Sat   10/20/2015 3.4 2.0-3.0 102.5 mg 15 mg 15 mg 12.5 mg 15 mg 15 mg 15 mg 15 mg    Anticoagulation Dose Instructions as of 10/20/2015      Total Sun Mon Tue Wed Thu Fri Sat   New Dose 100 mg 15 mg 15 mg 12.5 mg 15 mg 15 mg 15 mg 12.5 mg     (5 mg x 3)  (5 mg x 3)  (5 mg x 2.5)  (5 mg x 3)  (5 mg x 3)  (5 mg x 3)  (5 mg x 2.5)                           PLAN Patient has already taken today's dose. Weekly dose was decreased by 3% to 100 mg per week due to patient being adamant, concern for subtherapeutic INR (1.46) on doses lower than 100 mg.  Patient Instructions  Patient educated about medication as defined in this encounter and verbalized understanding by repeating back instructions provided.   Follow-up Return in about 3 days (around 10/23/2015) for Follow up INR on 10/23/2015 at 3pm.  Kim,Jennifer J  15 minutes spent face-to-face with the patient during the encounter. 50% of time spent on  education. 50% of time was spent on assessment and plan.

## 2015-10-30 ENCOUNTER — Ambulatory Visit (INDEPENDENT_AMBULATORY_CARE_PROVIDER_SITE_OTHER): Payer: Medicaid Other | Admitting: Pharmacist

## 2015-10-30 DIAGNOSIS — Z7901 Long term (current) use of anticoagulants: Secondary | ICD-10-CM | POA: Diagnosis not present

## 2015-10-30 DIAGNOSIS — D6859 Other primary thrombophilia: Secondary | ICD-10-CM | POA: Diagnosis not present

## 2015-10-30 LAB — POCT INR: INR: 2.4

## 2015-10-30 NOTE — Progress Notes (Signed)
Indication: Recurrent venous thromboembolism Duration: Lifelong INR: At target.  Dr. Gladstone Pih assessment and plan were reviewed and I agree with his documentation.

## 2015-10-30 NOTE — Progress Notes (Signed)
Anti-Coagulation Progress Note  Michele Gillespie is a 40 y.o. female who is currently on an anti-coagulation regimen.    RECENT RESULTS: Recent results are below, the most recent result is correlated with a dose of 100 mg. 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] Long term current use of anticoagulant [Z79.01]        Comments         ANTICOAG TODAY: Anticoagulation Summary as of 10/30/2015    INR goal 2.0-3.0  Selected INR 2.40 (10/30/2015)  Next INR check 11/27/2015  Target end date Indefinite   Indications  [place] [D68.59] Long term current use of anticoagulant [Z79.01]      Anticoagulation Episode Summary    [facility]   Preferred lab    Send INR reminders to    Comments       PATIENT INSTRUCTIONS: Patient Instructions  Patient instructed to take medications as defined in the Anti-coagulation Track section of this encounter.  Patient instructed to take today's dose.  Patient verbalized understanding of these instructions.       FOLLOW-UP Return in 4 weeks (on 11/27/2015) for Follow up INR at 1000h.  [name], III Pharm.D., CACP

## 2015-10-30 NOTE — Patient Instructions (Signed)
Patient instructed to take medications as defined in the Anti-coagulation Track section of this encounter.  Patient instructed to take today's dose.  Patient verbalized understanding of these instructions.    

## 2015-11-27 ENCOUNTER — Ambulatory Visit (INDEPENDENT_AMBULATORY_CARE_PROVIDER_SITE_OTHER): Payer: Medicaid Other | Admitting: Pharmacist

## 2015-11-27 DIAGNOSIS — Z7901 Long term (current) use of anticoagulants: Secondary | ICD-10-CM | POA: Diagnosis not present

## 2015-11-27 DIAGNOSIS — D6859 Other primary thrombophilia: Secondary | ICD-10-CM

## 2015-11-27 LAB — POCT INR: INR: 2

## 2015-11-27 NOTE — Patient Instructions (Signed)
Patient instructed to take medications as defined in the Anti-coagulation Track section of this encounter.  Patient instructed to take today's dose.  Patient verbalized understanding of these instructions.    

## 2015-11-27 NOTE — Progress Notes (Signed)
Anti-Coagulation Progress Note  Michele Gillespie is a 40 y.o. female who is currently on an anti-coagulation regimen.    RECENT RESULTS: Recent results are below, the most recent result is correlated with a dose of 102.5 mg. 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] Long term current use of anticoagulant [Z79.01]        Comments         ANTICOAG TODAY: Anticoagulation Summary as of 11/27/2015    INR goal 2.0-3.0  Selected INR 2.0 (11/27/2015)  Next INR check 12/18/2015  Target end date Indefinite   Indications  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  Patient instructed to take today's dose.  Patient verbalized understanding of these instructions.       FOLLOW-UP Return in 3 weeks (on 12/18/2015) for Follow up INR at 1000h.  [name], III Pharm.D., CACP

## 2015-12-18 ENCOUNTER — Ambulatory Visit (INDEPENDENT_AMBULATORY_CARE_PROVIDER_SITE_OTHER): Payer: Medicaid Other | Admitting: Pharmacist

## 2015-12-18 DIAGNOSIS — D6859 Other primary thrombophilia: Secondary | ICD-10-CM | POA: Diagnosis not present

## 2015-12-18 DIAGNOSIS — Z7901 Long term (current) use of anticoagulants: Secondary | ICD-10-CM

## 2015-12-18 LAB — POCT INR: INR: 2.8

## 2015-12-18 NOTE — Progress Notes (Signed)
Anti-Coagulation Progress Note  Michele Gillespie is a 40 y.o. female who is currently on an anti-coagulation regimen.    RECENT RESULTS: Recent results are below, the most recent result is correlated with a dose of 110 mg. 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] Long term current use of anticoagulant [Z79.01]        Comments         ANTICOAG TODAY: Anticoagulation Summary as of 12/18/2015    INR goal 2.0-3.0  Selected INR 2.80 (12/18/2015)  Next INR check 01/15/2016  Target end date Indefinite   Indications  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  Patient instructed to take today's dose.  Patient verbalized understanding of these instructions.       FOLLOW-UP Return in 4 weeks (on 01/15/2016) for Follow up INR at 1000h.  [name], III Pharm.D., CACP

## 2015-12-18 NOTE — Patient Instructions (Signed)
Patient instructed to take medications as defined in the Anti-coagulation Track section of this encounter.  Patient instructed to take today's dose.  Patient verbalized understanding of these instructions.    

## 2016-01-15 ENCOUNTER — Ambulatory Visit: Payer: Medicaid Other

## 2016-01-20 ENCOUNTER — Other Ambulatory Visit: Payer: Self-pay | Admitting: Internal Medicine

## 2016-01-22 NOTE — Telephone Encounter (Signed)
Last seen 06/2015 No show 01/15/16

## 2016-01-29 ENCOUNTER — Ambulatory Visit (INDEPENDENT_AMBULATORY_CARE_PROVIDER_SITE_OTHER): Payer: Medicaid Other | Admitting: Pharmacist

## 2016-01-29 DIAGNOSIS — D6859 Other primary thrombophilia: Secondary | ICD-10-CM | POA: Diagnosis not present

## 2016-01-29 DIAGNOSIS — Z7901 Long term (current) use of anticoagulants: Secondary | ICD-10-CM | POA: Diagnosis present

## 2016-01-29 LAB — POCT INR: INR: 2.3

## 2016-01-29 NOTE — Progress Notes (Signed)
Indication: Recurrent venous thromboembolism. Duration: Indefinite INR: At target.  Dr. Gladstone Pih assessment and plan were reviewed and I agree with his documentation.

## 2016-01-29 NOTE — Patient Instructions (Signed)
Patient instructed to take medications as defined in the Anti-coagulation Track section of this encounter.  Patient instructed to take today's dose.  Patient verbalized understanding of these instructions.    

## 2016-01-29 NOTE — Progress Notes (Signed)
Anti-Coagulation Progress Note  Michele Gillespie is a 40 y.o. female who is currently on an anti-coagulation regimen.    RECENT RESULTS: Recent results are below, the most recent result is correlated with a dose of 110 mg. 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] Long term current use of anticoagulant [Z79.01]        Comments         ANTICOAG TODAY: Anticoagulation Summary as of 01/29/2016    INR goal 2.0-3.0  Selected INR 2.30 (01/29/2016)  Next INR check 03/04/2016  Target end date Indefinite   Indications  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  Patient instructed to take today's dose.  Patient verbalized understanding of these instructions.       FOLLOW-UP Return in 5 weeks (on 03/04/2016) for Follow up INR at 2:30PM.  [name], III Pharm.D., CACP

## 2016-02-06 ENCOUNTER — Other Ambulatory Visit: Payer: Self-pay | Admitting: Internal Medicine

## 2016-02-11 ENCOUNTER — Emergency Department (HOSPITAL_COMMUNITY): Payer: Medicaid Other

## 2016-02-11 ENCOUNTER — Emergency Department (HOSPITAL_COMMUNITY)
Admission: EM | Admit: 2016-02-11 | Discharge: 2016-02-11 | Disposition: A | Discharge status: Home / Self Care | Payer: Medicaid Other | Attending: Emergency Medicine | Admitting: Emergency Medicine

## 2016-02-11 ENCOUNTER — Encounter (HOSPITAL_COMMUNITY): Payer: Self-pay | Admitting: *Deleted

## 2016-02-11 DIAGNOSIS — Z86718 Personal history of other venous thrombosis and embolism: Secondary | ICD-10-CM | POA: Diagnosis not present

## 2016-02-11 DIAGNOSIS — Y9289 Other specified places as the place of occurrence of the external cause: Secondary | ICD-10-CM | POA: Insufficient documentation

## 2016-02-11 DIAGNOSIS — T7411XA Adult physical abuse, confirmed, initial encounter: Secondary | ICD-10-CM | POA: Diagnosis not present

## 2016-02-11 DIAGNOSIS — F17210 Nicotine dependence, cigarettes, uncomplicated: Secondary | ICD-10-CM | POA: Diagnosis not present

## 2016-02-11 DIAGNOSIS — J45909 Unspecified asthma, uncomplicated: Secondary | ICD-10-CM | POA: Insufficient documentation

## 2016-02-11 DIAGNOSIS — Z7901 Long term (current) use of anticoagulants: Secondary | ICD-10-CM | POA: Insufficient documentation

## 2016-02-11 DIAGNOSIS — S0003XA Contusion of scalp, initial encounter: Secondary | ICD-10-CM | POA: Diagnosis not present

## 2016-02-11 DIAGNOSIS — Y9389 Activity, other specified: Secondary | ICD-10-CM | POA: Diagnosis not present

## 2016-02-11 DIAGNOSIS — Z79899 Other long term (current) drug therapy: Secondary | ICD-10-CM | POA: Insufficient documentation

## 2016-02-11 DIAGNOSIS — Y998 Other external cause status: Secondary | ICD-10-CM | POA: Diagnosis not present

## 2016-02-11 DIAGNOSIS — S0990XA Unspecified injury of head, initial encounter: Secondary | ICD-10-CM | POA: Insufficient documentation

## 2016-02-11 DIAGNOSIS — Z862 Personal history of diseases of the blood and blood-forming organs and certain disorders involving the immune mechanism: Secondary | ICD-10-CM | POA: Diagnosis not present

## 2016-02-11 DIAGNOSIS — F419 Anxiety disorder, unspecified: Secondary | ICD-10-CM | POA: Diagnosis not present

## 2016-02-11 DIAGNOSIS — Z86711 Personal history of pulmonary embolism: Secondary | ICD-10-CM | POA: Diagnosis not present

## 2016-02-11 DIAGNOSIS — Z792 Long term (current) use of antibiotics: Secondary | ICD-10-CM | POA: Diagnosis not present

## 2016-02-11 MED ORDER — ACETAMINOPHEN 500 MG PO TABS
1000.0000 mg | ORAL_TABLET | Freq: Once | ORAL | Status: AC
  Administered 2016-02-11: 1000 mg via ORAL
  Filled 2016-02-11: qty 2

## 2016-02-11 NOTE — Discharge Instructions (Signed)
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
## 2016-02-11 NOTE — ED Notes (Signed)
Pt ambulatory to room D32 from lobby; steady gait noted

## 2016-02-11 NOTE — ED Notes (Signed)
She takes blood thinners for dvts and pes
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
## 2016-02-13 ENCOUNTER — Ambulatory Visit (INDEPENDENT_AMBULATORY_CARE_PROVIDER_SITE_OTHER): Payer: Medicaid Other | Admitting: Internal Medicine

## 2016-02-13 ENCOUNTER — Ambulatory Visit (HOSPITAL_COMMUNITY)
Admission: RE | Admit: 2016-02-13 | Discharge: 2016-02-13 | Disposition: A | Discharge status: Home / Self Care | Payer: Medicaid Other | Source: Ambulatory Visit | Attending: Internal Medicine | Admitting: Internal Medicine

## 2016-02-13 ENCOUNTER — Other Ambulatory Visit: Payer: Self-pay

## 2016-02-13 ENCOUNTER — Ambulatory Visit (INDEPENDENT_AMBULATORY_CARE_PROVIDER_SITE_OTHER): Payer: Medicaid Other | Admitting: *Deleted

## 2016-02-13 ENCOUNTER — Encounter: Payer: Self-pay | Admitting: Internal Medicine

## 2016-02-13 VITALS — BP 153/96 | HR 75 | Temp 98.4°F | Ht 64.5 in | Wt 219.9 lb

## 2016-02-13 DIAGNOSIS — Z23 Encounter for immunization: Secondary | ICD-10-CM

## 2016-02-13 DIAGNOSIS — R079 Chest pain, unspecified: Secondary | ICD-10-CM

## 2016-02-13 DIAGNOSIS — D509 Iron deficiency anemia, unspecified: Secondary | ICD-10-CM

## 2016-02-13 DIAGNOSIS — R0789 Other chest pain: Secondary | ICD-10-CM | POA: Diagnosis not present

## 2016-02-13 DIAGNOSIS — R9431 Abnormal electrocardiogram [ECG] [EKG]: Secondary | ICD-10-CM | POA: Diagnosis not present

## 2016-02-13 MED ORDER — HEPATITIS B VAC RECOMBINANT 10 MCG/ML IJ SUSP
1.0000 mL | Freq: Once | INTRAMUSCULAR | Status: AC
  Administered 2016-02-13: 10 ug via INTRAMUSCULAR

## 2016-02-13 MED ORDER — HEPATITIS A VACCINE 1440 EL U/ML IM SUSP
1.0000 mL | Freq: Once | INTRAMUSCULAR | Status: AC
  Administered 2016-02-13: 1440 [IU] via INTRAMUSCULAR
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
## 2016-02-13 NOTE — Patient Instructions (Signed)
It was nice seeing you today.   We will be sending you to get a stress test done. Please take over the counter aspirin, when you get home. This is okay for now, as I know you are on a blood thinner.

## 2016-02-14 ENCOUNTER — Other Ambulatory Visit (HOSPITAL_COMMUNITY): Payer: Medicaid Other

## 2016-02-14 ENCOUNTER — Ambulatory Visit (HOSPITAL_COMMUNITY): Payer: Medicaid Other

## 2016-02-14 LAB — CBC
HEMOGLOBIN: 12.9 g/dL (ref 11.1–15.9)
Hematocrit: 39.5 % (ref 34.0–46.6)
MCH: 25.1 pg — AB (ref 26.6–33.0)
MCHC: 32.7 g/dL (ref 31.5–35.7)
MCV: 77 fL — AB (ref 79–97)
Platelets: 195 10*3/uL (ref 150–379)
RBC: 5.13 x10E6/uL (ref 3.77–5.28)
RDW: 16.2 % — ABNORMAL HIGH (ref 12.3–15.4)
WBC: 3.7 10*3/uL (ref 3.4–10.8)

## 2016-02-14 LAB — FERRITIN: FERRITIN: 23 ng/mL (ref 15–150)

## 2016-02-14 LAB — IRON: Iron: 37 ug/dL (ref 27–159)

## 2016-02-15 DIAGNOSIS — R079 Chest pain, unspecified: Secondary | ICD-10-CM

## 2016-02-15 HISTORY — DX: Chest pain, unspecified: R07.9

## 2016-02-15 NOTE — Assessment & Plan Note (Signed)
CBC check today. Also pt complaining of restless legs at night, most likely related to iron defc. Ferritin low at 23. She is supposed to be on iron therapy but has not been compliant. Her Hgb has improved - 12.9 from ~11.  She describes menorrhagia- uses 2 packs of pads each cycle.  With stable hgb no need for intervention at this time. She is also on warfarin which would likely interfere with estrogen containing contraceptive agents and so would limit options.   Encouraged compliance with iron.

## 2016-02-15 NOTE — Assessment & Plan Note (Addendum)
No chest pain today, last episode- >1 week ago. Chest Pain - not anginal in nature, but substernal, not associated with diaphoresis, SOB, nausea or palpitations, described as soreness. Pts risk factors- several- Family hx of CAD mother- in her 43s, significant and current smoking hx, polysubstance abuse-cocaine, alcohol. Not on statin. She does not have HTN or DM. She is on warfarin, with recent therapeutic INR so doubt PE.  Assessment & Plan- EKG in clinic with some changes compared to prior- 2015- T wave flattening in anterior leads V3, V4, with new incomplete left bundle branch. t waves in V5 also appear flat. AVL also has flattened T- waves. No identifiable Q waves or ST changes.  - With her significant risk factors, pt would benefit from a stress test, would have preferred a nuclear stress test, but patient has a hx of asthma. With her EKG changes, an EKG stress test would not be ideal. - Would therefore order a dobutamine stress echo.  - F/u in 2 weeks - Return precautions, also to go to the Ed urgently if chest pain recurs. - start daily aspirin - Consider lipid panel and starting statin next visit - Follow up with Bp if pt needs antiHTN, elevated today, but usually normal.  Addendum- Got a message from Nuclear medicine department that we can still do the Nuclear stress test, and patients tolerate the [place] despite COPD or Asthma. Will change order to Nuclear stress test instead.

## 2016-02-16 ENCOUNTER — Encounter (HOSPITAL_COMMUNITY): Payer: Self-pay

## 2016-02-16 ENCOUNTER — Emergency Department (HOSPITAL_COMMUNITY): Payer: Medicaid Other

## 2016-02-16 ENCOUNTER — Emergency Department (HOSPITAL_COMMUNITY)
Admission: EM | Admit: 2016-02-16 | Discharge: 2016-02-17 | Disposition: A | Discharge status: Home / Self Care | Payer: Medicaid Other | Attending: Emergency Medicine | Admitting: Emergency Medicine

## 2016-02-16 DIAGNOSIS — F17210 Nicotine dependence, cigarettes, uncomplicated: Secondary | ICD-10-CM | POA: Diagnosis not present

## 2016-02-16 DIAGNOSIS — Z86711 Personal history of pulmonary embolism: Secondary | ICD-10-CM | POA: Insufficient documentation

## 2016-02-16 DIAGNOSIS — IMO0002 Reserved for concepts with insufficient information to code with codable children: Secondary | ICD-10-CM

## 2016-02-16 DIAGNOSIS — J45909 Unspecified asthma, uncomplicated: Secondary | ICD-10-CM | POA: Insufficient documentation

## 2016-02-16 DIAGNOSIS — S0990XA Unspecified injury of head, initial encounter: Secondary | ICD-10-CM | POA: Diagnosis not present

## 2016-02-16 DIAGNOSIS — Z86718 Personal history of other venous thrombosis and embolism: Secondary | ICD-10-CM | POA: Insufficient documentation

## 2016-02-16 DIAGNOSIS — S40812A Abrasion of left upper arm, initial encounter: Secondary | ICD-10-CM | POA: Diagnosis not present

## 2016-02-16 DIAGNOSIS — Y92009 Unspecified place in unspecified non-institutional (private) residence as the place of occurrence of the external cause: Secondary | ICD-10-CM | POA: Insufficient documentation

## 2016-02-16 DIAGNOSIS — T7411XA Adult physical abuse, confirmed, initial encounter: Secondary | ICD-10-CM | POA: Insufficient documentation

## 2016-02-16 DIAGNOSIS — S30810A Abrasion of lower back and pelvis, initial encounter: Secondary | ICD-10-CM | POA: Diagnosis not present

## 2016-02-16 DIAGNOSIS — Z7901 Long term (current) use of anticoagulants: Secondary | ICD-10-CM | POA: Diagnosis not present

## 2016-02-16 DIAGNOSIS — Y998 Other external cause status: Secondary | ICD-10-CM | POA: Diagnosis not present

## 2016-02-16 DIAGNOSIS — Z8659 Personal history of other mental and behavioral disorders: Secondary | ICD-10-CM | POA: Insufficient documentation

## 2016-02-16 DIAGNOSIS — Z792 Long term (current) use of antibiotics: Secondary | ICD-10-CM | POA: Insufficient documentation

## 2016-02-16 DIAGNOSIS — Z862 Personal history of diseases of the blood and blood-forming organs and certain disorders involving the immune mechanism: Secondary | ICD-10-CM | POA: Insufficient documentation

## 2016-02-16 DIAGNOSIS — Z79899 Other long term (current) drug therapy: Secondary | ICD-10-CM | POA: Insufficient documentation

## 2016-02-16 DIAGNOSIS — Y9389 Activity, other specified: Secondary | ICD-10-CM | POA: Insufficient documentation

## 2016-02-16 DIAGNOSIS — S40022A Contusion of left upper arm, initial encounter: Secondary | ICD-10-CM | POA: Insufficient documentation
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
## 2016-02-16 NOTE — Addendum Note (Signed)
Addended by: Oval Linsey D on: 02/16/2016 04:52 PM   Modules accepted: Level of Service
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
## 2016-02-17 ENCOUNTER — Emergency Department (HOSPITAL_COMMUNITY): Payer: Medicaid Other

## 2016-02-17 NOTE — Discharge Instructions (Signed)
General Assault  Assault includes any behavior or physical attack--whether it is on purpose or not--that results in injury to another person, damage to property, or both. This also includes assault that has not yet happened, but is planned to happen. Threats of assault may be physical, verbal, or written. They may be said or sent by:   Mail.   E-mail.   Text.   Social media.   Fax.  The threats may be direct, implied, or understood.  WHAT ARE THE DIFFERENT FORMS OF ASSAULT?  Forms of assault include:   Physically assaulting a person. This includes physical threats to inflict physical harm as well as:    Slapping.    Hitting.    Poking.    Kicking.    Punching.    Pushing.   Sexually assaulting a person. Sexual assault is any sexual activity that a person is forced, threatened, or coerced to participate in. It may or may not involve physical contact with the person who is assaulting you. You are sexually assaulted if you are forced to have sexual contact of any kind.   Damaging or destroying a person's assistive equipment, such as glasses, canes, or walkers.   Throwing or hitting objects.   Using or displaying a weapon to harm or threaten someone.   Using or displaying an object that appears to be a weapon in a threatening manner.   Using greater physical size or strength to intimidate someone.   Making intimidating or threatening gestures.   Bullying.   Hazing.   Using language that is intimidating, threatening, hostile, or abusive.   Stalking.   Restraining someone with force.  WHAT SHOULD I DO IF I EXPERIENCE ASSAULT?   Report assaults, threats, and stalking to the police. Call your local emergency services (911 in the [place] if you are in immediate danger or you need medical help.   You can work with a lawyer or an advocate to get legal protection against someone who has assaulted you or threatened you with assault. Protection includes restraining orders and private addresses. Crimes against  you, such as assault, can also be prosecuted through the courts. Laws will vary depending on where you live.     This information is not intended to replace advice given to you by your health care provider. Make sure you discuss any questions you have with your health care provider.     Document Released: 09/16/2005 Document Revised: 10/07/2014 Document Reviewed: 06/03/2014  Elsevier Interactive Patient Education 2016 Elsevier Inc.

## 2016-02-17 NOTE — ED Notes (Signed)
Pt verbalized understanding of d/c instructions and has no further questions. Pt stable and NAD. Pt ambulatory.  

## 2016-02-20 ENCOUNTER — Encounter: Payer: Medicaid Other | Admitting: Internal Medicine

## 2016-03-04 ENCOUNTER — Ambulatory Visit: Payer: Medicaid Other

## 2016-03-07 ENCOUNTER — Telehealth: Payer: Self-pay | Admitting: Internal Medicine

## 2016-03-07 NOTE — Telephone Encounter (Signed)
Thanks Chilon, I have called the nuclear medicine department, and ordered a Nuclear stress test instead.  Ejiro.

## 2016-03-07 NOTE — Addendum Note (Signed)
Addended by: Bethena Roys on: 03/07/2016 12:47 PM   Modules accepted: Orders

## 2016-03-07 NOTE — Telephone Encounter (Signed)
Rec'd call from the [facility]) .  She states that if you would rather like to order the Nuclear Stress Test (As noted on 02/13/2016 in your office visit)  Instead of the Dobutamine Stress Echo you can.  Even though the Pt has HX of Asthma it can still be done.  You can contact their office @ [phone] if you have any questions about changing this order.

## 2016-03-11 ENCOUNTER — Other Ambulatory Visit: Payer: Self-pay | Admitting: Internal Medicine

## 2016-03-11 ENCOUNTER — Ambulatory Visit (INDEPENDENT_AMBULATORY_CARE_PROVIDER_SITE_OTHER): Payer: Medicaid Other | Admitting: Pharmacist

## 2016-03-11 DIAGNOSIS — D6859 Other primary thrombophilia: Secondary | ICD-10-CM

## 2016-03-11 DIAGNOSIS — Z7901 Long term (current) use of anticoagulants: Secondary | ICD-10-CM | POA: Diagnosis not present

## 2016-03-11 LAB — POCT INR: INR: 3.9

## 2016-03-11 NOTE — Progress Notes (Signed)
Anti-Coagulation Progress Note  Michele Gillespie is a 40 y.o. female who is currently on an anti-coagulation regimen.    RECENT RESULTS: Recent results are below, the most recent result is correlated with a dose of 110 mg. per week: Lab Results  Component Value Date   INR 3.90 03/11/2016   INR 2.30 01/29/2016   INR 2.80 12/18/2015    ANTI-COAG DOSE: Anticoagulation Dose Instructions as of 03/11/2016      [name] Tue Wed Thu Fri Sat   New Dose 15 mg 15 mg 15 mg 12.5 mg 15 mg 15 mg 15 mg       ANTICOAG SUMMARY: Anticoagulation Episode Summary    Current INR goal 2.0-3.0  Next INR check 04/08/2016  INR from last check 3.90! 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  Patient instructed to take today's dose.  Patient verbalized understanding of these instructions.       FOLLOW-UP Return in 4 weeks (on 04/08/2016) for Follow up INR at 2:15PM.  [name], III Pharm.D., CACP

## 2016-03-11 NOTE — Patient Instructions (Signed)
Patient instructed to take medications as defined in the Anti-coagulation Track section of this encounter.  Patient instructed to take today's dose.  Patient verbalized understanding of these instructions.    

## 2016-03-12 ENCOUNTER — Telehealth (HOSPITAL_COMMUNITY): Payer: Self-pay | Admitting: *Deleted

## 2016-03-12 ENCOUNTER — Other Ambulatory Visit: Payer: Self-pay | Admitting: Internal Medicine

## 2016-03-12 NOTE — Telephone Encounter (Signed)
Patient given detailed instructions per Myocardial Perfusion Study Information Sheet for the test on 03/18/16 at 0730. Patient notified to arrive 15 minutes early and that it is imperative to arrive on time for appointment to keep from having the test rescheduled.  If you need to cancel or reschedule your appointment, please call the office within 24 hours of your appointment. Failure to do so may result in a cancellation of your appointment, and a $50 no show fee. Patient verbalized understanding.Quinlin Conant, Ranae Palms

## 2016-03-13 ENCOUNTER — Encounter: Payer: Self-pay | Admitting: *Deleted

## 2016-03-14 ENCOUNTER — Encounter (HOSPITAL_COMMUNITY): Payer: Medicaid Other

## 2016-03-18 ENCOUNTER — Ambulatory Visit (HOSPITAL_COMMUNITY): Payer: Medicaid Other | Attending: Internal Medicine

## 2016-03-18 DIAGNOSIS — R079 Chest pain, unspecified: Secondary | ICD-10-CM | POA: Diagnosis not present

## 2016-03-18 DIAGNOSIS — Z8249 Family history of ischemic heart disease and other diseases of the circulatory system: Secondary | ICD-10-CM | POA: Diagnosis not present

## 2016-03-18 LAB — MYOCARDIAL PERFUSION IMAGING
CHL CUP NUCLEAR SDS: 1
CHL CUP NUCLEAR SRS: 0
CHL CUP NUCLEAR SSS: 1
LHR: 0.24
LV dias vol: 120 mL (ref 46–106)
LVSYSVOL: 60 mL
Peak HR: 97 {beats}/min
Rest HR: 68 {beats}/min
TID: 1.01

## 2016-03-18 MED ORDER — REGADENOSON 0.4 MG/5ML IV SOLN
0.4000 mg | Freq: Once | INTRAVENOUS | Status: AC
  Administered 2016-03-18: 0.4 mg via INTRAVENOUS

## 2016-03-18 MED ORDER — TECHNETIUM TC 99M TETROFOSMIN IV KIT
31.4000 | PACK | Freq: Once | INTRAVENOUS | Status: AC | PRN
  Administered 2016-03-18: 31 via INTRAVENOUS
  Filled 2016-03-18: qty 31

## 2016-03-18 MED ORDER — TECHNETIUM TC 99M TETROFOSMIN IV KIT
10.3000 | PACK | Freq: Once | INTRAVENOUS | Status: AC | PRN
  Administered 2016-03-18: 10 via INTRAVENOUS
  Filled 2016-03-18: qty 10

## 2016-03-19 NOTE — Progress Notes (Signed)
Quick Note:  Low risk stress test. Consider evaluating for other etiologies of chest pain if persistent- GI or musculoskeletal, also to emphasize cardiovascular risk modification- Quitting smoking to start. Called to inform patient of results, got voice mail. Did not leave details.  Ejiro. ______

## 2016-03-20 ENCOUNTER — Other Ambulatory Visit (HOSPITAL_COMMUNITY): Payer: Medicaid Other

## 2016-03-27 ENCOUNTER — Emergency Department (HOSPITAL_COMMUNITY): Payer: Medicaid Other

## 2016-03-27 ENCOUNTER — Emergency Department (HOSPITAL_COMMUNITY)
Admission: EM | Admit: 2016-03-27 | Discharge: 2016-03-27 | Disposition: A | Discharge status: Home / Self Care | Payer: Medicaid Other | Attending: Emergency Medicine | Admitting: Emergency Medicine

## 2016-03-27 ENCOUNTER — Encounter (HOSPITAL_COMMUNITY): Payer: Self-pay | Admitting: Emergency Medicine

## 2016-03-27 DIAGNOSIS — F17210 Nicotine dependence, cigarettes, uncomplicated: Secondary | ICD-10-CM | POA: Diagnosis not present

## 2016-03-27 DIAGNOSIS — Z7901 Long term (current) use of anticoagulants: Secondary | ICD-10-CM | POA: Diagnosis not present

## 2016-03-27 DIAGNOSIS — Y92511 Restaurant or cafe as the place of occurrence of the external cause: Secondary | ICD-10-CM | POA: Insufficient documentation

## 2016-03-27 DIAGNOSIS — S161XXA Strain of muscle, fascia and tendon at neck level, initial encounter: Secondary | ICD-10-CM | POA: Insufficient documentation

## 2016-03-27 DIAGNOSIS — J45909 Unspecified asthma, uncomplicated: Secondary | ICD-10-CM | POA: Insufficient documentation

## 2016-03-27 DIAGNOSIS — Y939 Activity, unspecified: Secondary | ICD-10-CM | POA: Diagnosis not present

## 2016-03-27 DIAGNOSIS — Y999 Unspecified external cause status: Secondary | ICD-10-CM | POA: Diagnosis not present

## 2016-03-27 DIAGNOSIS — T148XXA Other injury of unspecified body region, initial encounter: Secondary | ICD-10-CM

## 2016-03-27 DIAGNOSIS — R51 Headache: Secondary | ICD-10-CM | POA: Diagnosis not present

## 2016-03-27 DIAGNOSIS — S199XXA Unspecified injury of neck, initial encounter: Secondary | ICD-10-CM | POA: Diagnosis present

## 2016-03-27 DIAGNOSIS — S0003XA Contusion of scalp, initial encounter: Secondary | ICD-10-CM | POA: Insufficient documentation

## 2016-03-27 DIAGNOSIS — S139XXA Sprain of joints and ligaments of unspecified parts of neck, initial encounter: Secondary | ICD-10-CM

## 2016-03-27 MED ORDER — HYDROCODONE-ACETAMINOPHEN 5-325 MG PO TABS: 1.0000 | ORAL_TABLET | Freq: Four times a day (QID) | ORAL | Status: DC | PRN

## 2016-03-27 MED ORDER — CYCLOBENZAPRINE HCL 10 MG PO TABS: 10.0000 mg | ORAL_TABLET | Freq: Two times a day (BID) | ORAL | Status: DC | PRN

## 2016-03-27 NOTE — Discharge Instructions (Signed)
General Assault  Assault includes any behavior or physical attack--whether it is on purpose or not--that results in injury to another person, damage to property, or both. This also includes assault that has not yet happened, but is planned to happen. Threats of assault may be physical, verbal, or written. They may be said or sent by:   Mail.   E-mail.   Text.   Social media.   Fax.  The threats may be direct, implied, or understood.  WHAT ARE THE DIFFERENT FORMS OF ASSAULT?  Forms of assault include:   Physically assaulting a person. This includes physical threats to inflict physical harm as well as:    Slapping.    Hitting.    Poking.    Kicking.    Punching.    Pushing.   Sexually assaulting a person. Sexual assault is any sexual activity that a person is forced, threatened, or coerced to participate in. It may or may not involve physical contact with the person who is assaulting you. You are sexually assaulted if you are forced to have sexual contact of any kind.   Damaging or destroying a person's assistive equipment, such as glasses, canes, or walkers.   Throwing or hitting objects.   Using or displaying a weapon to harm or threaten someone.   Using or displaying an object that appears to be a weapon in a threatening manner.   Using greater physical size or strength to intimidate someone.   Making intimidating or threatening gestures.   Bullying.   Hazing.   Using language that is intimidating, threatening, hostile, or abusive.   Stalking.   Restraining someone with force.  WHAT SHOULD I DO IF I EXPERIENCE ASSAULT?   Report assaults, threats, and stalking to the police. Call your local emergency services (911 in the [place] if you are in immediate danger or you need medical help.   You can work with a lawyer or an advocate to get legal protection against someone who has assaulted you or threatened you with assault. Protection includes restraining orders and private addresses. Crimes against  you, such as assault, can also be prosecuted through the courts. Laws will vary depending on where you live.     This information is not intended to replace advice given to you by your health care provider. Make sure you discuss any questions you have with your health care provider.     Document Released: 09/16/2005 Document Revised: 10/07/2014 Document Reviewed: 06/03/2014  Elsevier Interactive Patient Education 2016 Elsevier Inc.
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
## 2016-04-08 ENCOUNTER — Ambulatory Visit (INDEPENDENT_AMBULATORY_CARE_PROVIDER_SITE_OTHER): Payer: Medicaid Other | Admitting: Pharmacist

## 2016-04-08 DIAGNOSIS — D6859 Other primary thrombophilia: Secondary | ICD-10-CM

## 2016-04-08 DIAGNOSIS — Z7901 Long term (current) use of anticoagulants: Secondary | ICD-10-CM | POA: Diagnosis not present

## 2016-04-08 LAB — POCT INR: INR: 1.6

## 2016-04-08 MED ORDER — WARFARIN SODIUM 5 MG PO TABS: 15.0000 mg | ORAL_TABLET | Freq: Every day | ORAL | Status: DC

## 2016-04-08 NOTE — Patient Instructions (Signed)
Patient instructed to take medications as defined in the Anti-coagulation Track section of this encounter.  Patient instructed to take today's dose.  Patient verbalized understanding of these instructions.    

## 2016-04-08 NOTE — Progress Notes (Signed)
Anti-Coagulation Progress Note  Michele Gillespie is a 40 y.o. female who is currently on an anti-coagulation regimen.    RECENT RESULTS: Recent results are below, the most recent result is correlated with a dose of 87.5 mg. per week (supposed to be 102.5mg /wk but missed last Thursday's dose): Lab Results  Component Value Date   INR 1.60 04/08/2016   INR 3.90 03/11/2016   INR 2.30 01/29/2016    ANTI-COAG DOSE: Anticoagulation Dose Instructions as of 04/08/2016      [name] Tue Wed Thu Fri Sat   New Dose 15 mg 15 mg 15 mg 15 mg 15 mg 15 mg 15 mg       ANTICOAG SUMMARY: Anticoagulation Episode Summary    Current INR goal 2.0-3.0  Next INR check 04/29/2016  INR from last check 1.60! 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  Patient instructed to take today's dose.  Patient verbalized understanding of these instructions.       FOLLOW-UP Return in 3 weeks (on 04/29/2016) for Follow up INR at 0945h.  [name], III Pharm.D., CACP

## 2016-04-08 NOTE — Progress Notes (Signed)
INTERNAL MEDICINE TEACHING ATTENDING ADDENDUM - Michele Groves, DO  Duration- lifelong, Indication- PE/DVT/ Hypercoag State, INR- 1.6 Subtheraputic due to missed dose. Agree with pharmacy recommendations as outlined in their note.

## 2016-04-29 ENCOUNTER — Encounter (INDEPENDENT_AMBULATORY_CARE_PROVIDER_SITE_OTHER): Payer: Self-pay

## 2016-04-29 ENCOUNTER — Ambulatory Visit (INDEPENDENT_AMBULATORY_CARE_PROVIDER_SITE_OTHER): Payer: Medicaid Other | Admitting: Pharmacist

## 2016-04-29 DIAGNOSIS — D6859 Other primary thrombophilia: Secondary | ICD-10-CM

## 2016-04-29 DIAGNOSIS — Z86718 Personal history of other venous thrombosis and embolism: Secondary | ICD-10-CM | POA: Diagnosis not present

## 2016-04-29 DIAGNOSIS — Z7901 Long term (current) use of anticoagulants: Secondary | ICD-10-CM

## 2016-04-29 DIAGNOSIS — Z86711 Personal history of pulmonary embolism: Secondary | ICD-10-CM

## 2016-04-29 LAB — POCT INR: INR: 2.2

## 2016-04-29 MED ORDER — WARFARIN SODIUM 5 MG PO TABS: 15.0000 mg | ORAL_TABLET | Freq: Every day | ORAL | 2 refills | Status: DC

## 2016-04-29 NOTE — Patient Instructions (Signed)
Patient educated about medication as defined in this encounter and verbalized understanding by repeating back instructions provided.   

## 2016-04-29 NOTE — Progress Notes (Signed)
Anticoagulation Management Michele Gillespie is a 40 y.o. female who reports to the clinic for monitoring of warfarin treatment.    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] Long term current use of anticoagulant [Z79.01]       Comments:          ASSESSMENT Recent Results: The most recent result is correlated with 105 mg per week: Lab Results  Component Value Date   INR 2.2 04/29/2016   INR 1.60 04/08/2016   INR 3.90 03/11/2016    Anticoagulation Dosing: INR as of 04/29/2016 and Previous Dosing Information    INR Dt INR Goal [employer] Sun Mon Tue Wed Thu Fri Sat   04/29/2016 2.2 2.0-3.0 105 mg 15 mg 15 mg 15 mg 15 mg 15 mg 15 mg 15 mg    Anticoagulation Dose Instructions as of 04/29/2016      Total Sun Mon Tue Wed Thu Fri Sat   New Dose 105 mg 15 mg 15 mg 15 mg 15 mg 15 mg 15 mg 15 mg     (5 mg x 3)  (5 mg x 3)  (5 mg x 3)  (5 mg x 3)  (5 mg x 3)  (5 mg x 3)  (5 mg x 3)                           INR today: Therapeutic  PLAN Weekly dose was unchanged   Patient Instructions  Patient educated about medication as defined in this encounter and verbalized understanding by repeating back instructions provided.    Patient advised to contact clinic or seek medical attention if signs/symptoms of bleeding or thromboembolism occur.  Patient verbalized understanding by repeating back information and was advised to contact me if further medication-related questions arise. Patient was also  provided an information handout.  Follow-up Return in about 4 weeks (around 05/27/2016) for Follow up INR 05/27/16 around 10am.  Michele Gillespie J  15 minutes spent face-to-face with the patient during the encounter. 50% of time spent on education. 50% of time was spent on assessment and plan.

## 2016-05-09 ENCOUNTER — Other Ambulatory Visit: Payer: Self-pay | Admitting: Family Medicine

## 2016-05-09 DIAGNOSIS — Z1231 Encounter for screening mammogram for malignant neoplasm of breast: Secondary | ICD-10-CM

## 2016-05-09 NOTE — Progress Notes (Signed)
I have reviewed Dr. Julianne Rice note.  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  INR was 2.2, at goal.

## 2016-05-17 ENCOUNTER — Emergency Department (HOSPITAL_COMMUNITY)
Admission: EM | Admit: 2016-05-17 | Discharge: 2016-05-17 | Disposition: A | Discharge status: Home / Self Care | Payer: Medicaid Other | Attending: Emergency Medicine | Admitting: Emergency Medicine

## 2016-05-17 ENCOUNTER — Encounter (HOSPITAL_COMMUNITY): Payer: Self-pay | Admitting: Emergency Medicine

## 2016-05-17 ENCOUNTER — Emergency Department (HOSPITAL_COMMUNITY): Payer: Medicaid Other

## 2016-05-17 DIAGNOSIS — F17210 Nicotine dependence, cigarettes, uncomplicated: Secondary | ICD-10-CM | POA: Insufficient documentation

## 2016-05-17 DIAGNOSIS — M25552 Pain in left hip: Secondary | ICD-10-CM | POA: Insufficient documentation

## 2016-05-17 DIAGNOSIS — Z7901 Long term (current) use of anticoagulants: Secondary | ICD-10-CM | POA: Diagnosis not present

## 2016-05-17 MED ORDER — DEXAMETHASONE SODIUM PHOSPHATE 10 MG/ML IJ SOLN
10.0000 mg | Freq: Once | INTRAMUSCULAR | Status: AC
  Administered 2016-05-17: 10 mg via INTRAMUSCULAR
  Filled 2016-05-17: qty 1

## 2016-05-17 NOTE — ED Triage Notes (Signed)
Left hip pain x 6 days  No in jury she states

## 2016-05-17 NOTE — ED Provider Notes (Signed)
[facility] DEPT Provider Note   CSN:  [place]:[id number] Arrival date & time: 05/17/16  1018  By signing my name below, I, Hansel Feinstein, attest that this documentation has been prepared under the direction and in the presence of  Janetta Hora, PA-C. Electronically Signed: Hansel Feinstein, ED Scribe. 05/17/16. 10:55 AM.    History   Chief Complaint Chief Complaint  Patient presents with  . Hip Pain    HPI Michele Gillespie is a 40 y.o. female who presents to the Emergency Department complaining of moderate, waxing and waning, 5/10 left hip pain with radiation to the left lower back and the left lateral thigh onset 6 days ago. Pt also reports one episode of numbness to her lower extremities with her current symptoms days ago, but denies any since. Pt denies recent trauma, falls or injury, but does note a distant h/o L2 injury. She also reports she often has to lift her son, who is in a wheelchair which aggravates her symptoms. She reports her pain is worsened with bending, sitting, lying flat, weight-bearing, ambulation and movement. Pt reports that she has been taking Tylenol with temporary relief of pain. Pt is ambulatory without difficulty. She denies weakness or paresthesia to BLE.    The history is provided by the patient. No language interpreter was used.    Past Medical History:  Diagnosis Date  . Anemia 2007    microcytic anemia, baseline hemoglobin 10-11, MCV at baseline 72-77, secondary to iron deficiency  . Arm DVT (deep venous thromboembolism), acute [facility])  September 23, 2009, January 05, 2010    Doppler study significant with indeterminant age DVT involving the left upper extremity, Doppler performed January 05, 2010 consistent with acute DVT involving the left upper extremity  . Asthma   . Depression   . Leukopenia 2008    unclear etiology baseline WBC  2.8-3.7  . Lung nodule  June 10, 2008    stable tiny noduke noted along the minor fissure of the right lung on CT angio September 11, 09  -  stable for 2 years and consistent with benign disease  . Pulmonary embolism [facility])  September 07, 2005    her CT angiogram - positive for pulmonary emboli to several branches of the right lower lobe- relatively small clot burden, clear lung; patient started on Coumadin; CT angiogram on January 10, 2006 showed resolution of previously seen pulmonary emboli with minimal basilar atelectasis  . Schizophrenia [facility])     Patient Active Problem List   Diagnosis Date Noted  . Chest pain 02/15/2016  . Secondary amenorrhea 09/14/2014  . Allergic rhinitis with Asthma. 09/14/2014  . GERD (gastroesophageal reflux disease) 09/14/2013  . Breast mass, right 09/14/2013  . Healthcare maintenance 09/14/2013  . Cervical mass 02/08/2013  . H/O cesarean section 12/21/2012  . Tobacco use disorder 06/22/2012  . Long term current use of anticoagulant 10/20/2010  . DEPRESSION, HX OF 05/16/2010  . Iron deficiency anemia 10/12/2006  . LEUKOCYTOPENIA NOS 10/12/2006  . HYPERCOAGULABLE STATE, PRIMARY 10/12/2006    Past Surgical History:  Procedure Laterality Date  . CESAREAN SECTION      History of 5 C-section  . EXPLORATORY LAPAROTOMY WITH ABDOMINAL MASS EXCISION  02/2005  . TUBAL LIGATION      OB History    Gravida Para Term Preterm AB Living   5 4 4  0 0 5   SAB TAB Ectopic Multiple Live Births   0 0 0 0         Home  Medications    Prior to Admission medications   Medication Sig Start Date End Date Taking? Authorizing Provider  cyclobenzaprine (FLEXERIL) 10 MG tablet Take 1 tablet (10 mg total) by mouth 2 (two) times daily as needed for muscle spasms. 03/27/16   [name], MD  esomeprazole (NEXIUM) 20 MG capsule Take 20 mg by mouth daily at 12 noon.    Historical Provider, MD  HYDROcodone-acetaminophen (NORCO/VICODIN) 5-325 MG tablet Take 1-2 tablets by mouth every 6 (six) hours as needed. 03/27/16   [name], MD  loratadine (CLARITIN) 10 MG tablet TAKE 1 CAPSULE (10 MG TOTAL) BY MOUTH  DAILY. Patient taking differently: TAKE 1 CAPSULE (10 MG TOTAL) BY MOUTH DAILY AS NEEDED FOR ALLERGIES 01/22/16   Ejiroghene E Emokpae, MD  nicotine (NICODERM CQ - DOSED IN MG/24 HOURS) 14 mg/24hr patch RX #2 Weeks 5-6: 14 mg x 1 patch daily. Wear for 24 hours. If you have sleep disturbances, remove at bedtime. Patient taking differently: Place 14 mg onto the skin daily as needed (for smoking cessation). RX #2 Weeks 5-6: 14 mg x 1 patch daily. Wear for 24 hours. If you have sleep disturbances, remove at bedtime. 10/20/15   Ejiroghene Arlyce Dice, MD  nicotine polacrilex (NICORETTE) 2 MG gum RX #3 Weeks 10-12: 1 piece every 4-8 hours. Max 24 pieces per day. 10/20/15   Ejiroghene Arlyce Dice, MD  omeprazole (PRILOSEC) 20 MG capsule TAKE 1 CAPSULE (20 MG TOTAL) BY MOUTH DAILY. 03/13/16   Ejiroghene E Emokpae, MD  PATADAY 0.2 % SOLN PLACE 1 DROP INTO BOTH EYES EVERY DAY 03/12/16   Ejiroghene Arlyce Dice, MD  PROVENTIL HFA 108 (90 Base) MCG/ACT inhaler INHALE 2 PUFFS INTO THE LUNGS EVERY 6 HOURS AS NEEDED FOR WHEEZING. FOR SHORTNESS OF BREATH 01/22/16   Ejiroghene Arlyce Dice, MD  warfarin (COUMADIN) 5 MG tablet Take 3 tablets (15 mg total) by mouth daily at 6 PM. 04/29/16   Sid Falcon, MD    Family History Family History  Problem Relation Age of Onset  . Hypertension Mother   . Heart disease Mother   . Diabetes Father   . Birth defects Maternal Aunt   . Birth defects Maternal Uncle   . Diabetes Paternal Grandmother     Social History Social History  Substance Use Topics  . Smoking status: Current Every Day Smoker    Packs/day: 0.50    Years: 0.00    Types: Cigarettes  . Smokeless tobacco: Never Used     Comment: about 1/2 pack a day  . Alcohol use 0.0 oz/week     Allergies   Review of patient's allergies indicates no known allergies.   Review of Systems Review of Systems  Musculoskeletal: Positive for arthralgias.  Neurological: Positive for numbness (one episode; none since). Negative for  weakness.       -paresthesia     Physical Exam Updated Vital Signs BP (!) 134/104 (BP Location: Left Arm)   Pulse 94   Temp 98.4 F (36.9 C) (Oral)   Resp 17   Ht 5' 4.5" (1.638 m)   Wt 219 lb (99.3 kg)   SpO2 98%   BMI 37.01 kg/m   Physical Exam  Constitutional: She appears well-developed and well-nourished.  HENT:  Head: Normocephalic.  Eyes: Conjunctivae are normal.  Neck: Normal range of motion.  Cardiovascular: Normal rate.   Pulmonary/Chest: Effort normal. No respiratory distress.  Abdominal: She exhibits no distension.  Musculoskeletal: Normal range of motion.  Lumbar spine: No tenderness  of lumbar spine. Some tenderness of sacrum with maximal point of tenderness over the SI joint. Negative SLR. Left hip: No obvious swelling or deformity. No tenderness to palpation of lateral or anterior hip. FROM. Pain with internal rotation. Normal gait.   Neurological: She is alert.  Skin: Skin is warm and dry.  Psychiatric: She has a normal mood and affect. Her behavior is normal.  Nursing note and vitals reviewed.    ED Treatments / Results  Labs (all labs ordered are listed, but only abnormal results are displayed) Labs Reviewed - No data to display  EKG  EKG Interpretation None       Radiology No results found.  Procedures Procedures (including critical care time)  DIAGNOSTIC STUDIES: Oxygen Saturation is 98% on RA, normal by my interpretation.    COORDINATION OF CARE: 10:50 AM Discussed treatment plan with pt at bedside which includes XR and pt agreed to plan.    Medications Ordered in ED Medications - No data to display   Initial Impression / Assessment and Plan / ED Course  I have reviewed the triage vital signs and the nursing notes.  Pertinent labs & imaging results that were available during my care of the patient were reviewed by me and considered in my medical decision making (see chart for details).  Clinical Course    ARLISHA CERBONE  presents to the ED for evaluation of left hip pain. XR negative for acute abnormality. Xray of lumbar spine in May 2017 was negative for bony pathology as well. Pt treated in the ED with IM Decadron. Advised Tylenol or Ibuprofen at home. Continue muscle relaxers prn. Referral to orthopedist provided. Conservative therapies discussed and recommended such as ice. Patient advised to follow up with PCP as needed, or with worsening symptoms. Patient appears stable for discharge at this time. Return precautions discussed and outlined in discharge paperwork. Patient is agreeable to plan.     Final Clinical Impressions(s) / ED Diagnoses   Final diagnoses:  Left hip pain    New Prescriptions New Prescriptions   No medications on file    I personally performed the services described in this documentation, which was scribed in my presence. The recorded information has been reviewed and is accurate.    Recardo Evangelist, PA-C 05/17/16 [place], MD 05/17/16 [phone]

## 2016-05-17 NOTE — ED Notes (Signed)
Patient states can't go to the restroom right now.   Patient states she will try to go "in a minute".

## 2016-05-17 NOTE — ED Notes (Signed)
Patient states has been taking tylenol for pain at home, but doesn't like to take pills.

## 2016-05-27 ENCOUNTER — Ambulatory Visit: Payer: Medicaid Other

## 2016-05-28 ENCOUNTER — Ambulatory Visit (INDEPENDENT_AMBULATORY_CARE_PROVIDER_SITE_OTHER): Payer: Medicaid Other | Admitting: Pharmacist

## 2016-05-28 DIAGNOSIS — D6859 Other primary thrombophilia: Secondary | ICD-10-CM

## 2016-05-28 DIAGNOSIS — Z7901 Long term (current) use of anticoagulants: Secondary | ICD-10-CM

## 2016-05-28 LAB — POCT INR: INR: 2

## 2016-05-28 MED ORDER — WARFARIN SODIUM 5 MG PO TABS: ORAL_TABLET | ORAL | 2 refills | Status: DC

## 2016-05-28 NOTE — Patient Instructions (Signed)
Patient instructed to take medications as defined in the Anti-coagulation Track section of this encounter.  Patient instructed to take today's dose.  Patient verbalized understanding of these instructions.    

## 2016-05-28 NOTE — Progress Notes (Signed)
Indication: Unspecified hypercoagulable state Duration: Apparently lifelong INR: At target.  Dr. Gladstone Pih assessment and plan were reviewed and I agree with his documentation.

## 2016-05-28 NOTE — Progress Notes (Signed)
Anti-Coagulation Progress Note  Michele Gillespie is a 40 y.o. female who is currently on an anti-coagulation regimen.    RECENT RESULTS: Recent results are below, the most recent result is correlated with a dose of 105 mg. 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   FOLLOW-UP Return in 4 weeks (on 06/24/2016) for Follow up INR at 1000h.  [name], III Pharm.D., CACP

## 2016-05-31 ENCOUNTER — Other Ambulatory Visit: Payer: Self-pay | Admitting: Orthopedic Surgery

## 2016-05-31 DIAGNOSIS — M79605 Pain in left leg: Secondary | ICD-10-CM

## 2016-05-31 DIAGNOSIS — M545 Low back pain: Secondary | ICD-10-CM

## 2016-06-07 ENCOUNTER — Ambulatory Visit
Admission: RE | Admit: 2016-06-07 | Discharge: 2016-06-07 | Disposition: A | Discharge status: Home / Self Care | Payer: Medicaid Other | Source: Ambulatory Visit | Attending: Orthopedic Surgery | Admitting: Orthopedic Surgery

## 2016-06-07 DIAGNOSIS — M79605 Pain in left leg: Secondary | ICD-10-CM

## 2016-06-07 DIAGNOSIS — M545 Low back pain: Secondary | ICD-10-CM

## 2016-06-17 ENCOUNTER — Ambulatory Visit
Admission: RE | Admit: 2016-06-17 | Discharge: 2016-06-17 | Disposition: A | Discharge status: Home / Self Care | Payer: Medicaid Other | Source: Ambulatory Visit | Attending: Family Medicine | Admitting: Family Medicine

## 2016-06-17 DIAGNOSIS — Z1231 Encounter for screening mammogram for malignant neoplasm of breast: Secondary | ICD-10-CM

## 2016-06-20 ENCOUNTER — Ambulatory Visit (INDEPENDENT_AMBULATORY_CARE_PROVIDER_SITE_OTHER): Payer: Medicaid Other | Admitting: Internal Medicine

## 2016-06-20 ENCOUNTER — Encounter: Payer: Self-pay | Admitting: Internal Medicine

## 2016-06-20 VITALS — BP 144/93 | HR 76 | Temp 98.3°F | Ht 64.0 in | Wt 210.9 lb

## 2016-06-20 DIAGNOSIS — N858 Other specified noninflammatory disorders of uterus: Secondary | ICD-10-CM

## 2016-06-20 DIAGNOSIS — Z23 Encounter for immunization: Secondary | ICD-10-CM

## 2016-06-20 DIAGNOSIS — F329 Major depressive disorder, single episode, unspecified: Secondary | ICD-10-CM | POA: Diagnosis not present

## 2016-06-20 DIAGNOSIS — N9489 Other specified conditions associated with female genital organs and menstrual cycle: Secondary | ICD-10-CM

## 2016-06-20 DIAGNOSIS — D6859 Other primary thrombophilia: Secondary | ICD-10-CM

## 2016-06-20 DIAGNOSIS — N888 Other specified noninflammatory disorders of cervix uteri: Secondary | ICD-10-CM

## 2016-06-20 DIAGNOSIS — Z7901 Long term (current) use of anticoagulants: Secondary | ICD-10-CM | POA: Diagnosis not present

## 2016-06-20 DIAGNOSIS — Z0000 Encounter for general adult medical examination without abnormal findings: Secondary | ICD-10-CM

## 2016-06-20 DIAGNOSIS — F32A Depression, unspecified: Secondary | ICD-10-CM | POA: Insufficient documentation

## 2016-06-20 DIAGNOSIS — R1900 Intra-abdominal and pelvic swelling, mass and lump, unspecified site: Secondary | ICD-10-CM

## 2016-06-20 HISTORY — DX: Intra-abdominal and pelvic swelling, mass and lump, unspecified site: R19.00

## 2016-06-20 MED ORDER — SERTRALINE HCL 50 MG PO TABS: 50.0000 mg | ORAL_TABLET | Freq: Every day | ORAL | 3 refills | Status: DC
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
## 2016-06-20 NOTE — Assessment & Plan Note (Signed)
Patient recently had a lumbar MRI without contrast to evaluate for low back pain and left leg pain. This was done from her orthopedist. This exam revealed a cystic abnormality in the right pelvis measuring 4.27 m in diameter. Given the patient's age this most likely represents a simple cyst on imaging cannot completely evaluate this finding. -- Complete pelvic ultrasound -- Follow-up in 6 weeks -- Pap smear at that time

## 2016-06-20 NOTE — Assessment & Plan Note (Addendum)
Patient has a hypercoagulable state requiring anticoagulation. Currently she is on warfarin and follows up with the [facility]. Her most recent INR was 2.0. -- Follow-up with Dr. gross in clinic on 06/24/2016 -- Continue warfarin dose as recommended per pharmacy

## 2016-06-20 NOTE — Progress Notes (Signed)
   CC: Follow-up on imaging HPI: [name] is a 40 y.o. female with a h/o of hypercoagulable state on warfarin, gastroesophageal reflux disease, tobacco use disorder who presents to follow-up on imaging results from her recent lumbar MRI without contrast. She denies shortness of breath, chest pain, nausea, vomiting or abdominal pain. She denies constipation. She has no additional acute complaints or concerns today.  Please see problem-based charting for status of medical issues pertinent to this visit.    Review of Systems: A complete ROS was negative except as per HPI.  Physical Exam: Vitals:   06/20/16 1336  BP: (!) 144/93  Pulse: 76  Temp: 98.3 F (36.8 C)  TempSrc: Oral  SpO2: 100%  Weight: 210 lb 14.4 oz (95.7 kg)  Height: 5\' 4"  (1.626 m)   General appearance: alert and cooperative Lungs: clear to auscultation bilaterally Heart: regular rate and rhythm, S1, S2 normal, no murmur, click, rub or gallop Abdomen: soft, non-tender; bowel sounds normal; no masses,  no organomegaly Extremities: extremities normal, atraumatic, no cyanosis or edema  Assessment & Plan:  See encounters tab for problem based medical decision making. Patient seen with Dr. Dareen Piano  Signed: Ophelia Shoulder, MD 06/20/2016, 1:41 PM  Pager: [phone]

## 2016-06-20 NOTE — Assessment & Plan Note (Signed)
Patient received a flu shot today.

## 2016-06-20 NOTE — Patient Instructions (Addendum)
It was a pleasure seeing you today. Thank you for choosing [facility] for your healthcare needs.   Today we have given you a flu shot.  We will start you on a new medication called Zoloft. This medication is used to treat depression. Please take 1 pill once daily. Please return to clinic in 6 weeks to follow-up and have a Pap smear.  To help improve your sleep please get melatonin over-the-counter and take 1 pill every evening before bed.

## 2016-06-24 ENCOUNTER — Ambulatory Visit (INDEPENDENT_AMBULATORY_CARE_PROVIDER_SITE_OTHER): Payer: Medicaid Other | Admitting: Pharmacist

## 2016-06-24 DIAGNOSIS — D6859 Other primary thrombophilia: Secondary | ICD-10-CM | POA: Diagnosis not present

## 2016-06-24 DIAGNOSIS — Z7901 Long term (current) use of anticoagulants: Secondary | ICD-10-CM | POA: Diagnosis not present

## 2016-06-24 LAB — POCT INR: INR: 3.5

## 2016-06-24 NOTE — Progress Notes (Signed)
Anti-Coagulation Progress Note  Michele Gillespie is a 40 y.o. female who is currently on an anti-coagulation regimen.    RECENT RESULTS: Recent results are below, the most recent result is correlated with a dose of 115 mg. per week: Lab Results  Component Value Date   INR 3.50 06/24/2016   INR 2.0 05/28/2016   INR 2.2 04/29/2016    ANTI-COAG DOSE: Anticoagulation Dose Instructions as of 06/24/2016      [name] Tue Wed Thu Fri Sat   New Dose 15 mg 15 mg 15 mg 17.5 mg 15 mg 15 mg 15 mg       ANTICOAG SUMMARY: Anticoagulation Episode Summary    Current INR goal:   2.0-3.0  TTR:   44.1 % (4.1 y)  Next INR check:   07/22/2016  INR from last check:   3.50! 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   FOLLOW-UP Return in 4 weeks (on 07/22/2016) for Follow up INR at 1100h.  [name], III Pharm.D., CACP

## 2016-06-24 NOTE — Patient Instructions (Signed)
Patient instructed to take medications as defined in the Anti-coagulation Track section of this encounter.  Patient instructed to take today's dose.  Patient verbalized understanding of these instructions.    

## 2016-06-26 NOTE — Progress Notes (Signed)
Internal Medicine Clinic Attending  I saw and evaluated the patient.  I personally confirmed the key portions of the history and exam documented by Dr. Taylor and I reviewed pertinent patient test results.  The assessment, diagnosis, and plan were formulated together and I agree with the documentation in the resident's note.  

## 2016-07-03 ENCOUNTER — Telehealth: Payer: Self-pay | Admitting: Internal Medicine

## 2016-07-03 DIAGNOSIS — R1900 Intra-abdominal and pelvic swelling, mass and lump, unspecified site: Secondary | ICD-10-CM

## 2016-07-03 NOTE — Telephone Encounter (Signed)
I have updated the patient's ordered to include a transvaginal ultrasound to evaluate the suspicious pelvic mass seen on recent imaging studies.

## 2016-07-10 ENCOUNTER — Ambulatory Visit (HOSPITAL_COMMUNITY)
Admission: RE | Admit: 2016-07-10 | Discharge: 2016-07-10 | Disposition: A | Discharge status: Home / Self Care | Payer: Medicaid Other | Source: Ambulatory Visit | Attending: Internal Medicine | Admitting: Internal Medicine

## 2016-07-10 DIAGNOSIS — R1900 Intra-abdominal and pelvic swelling, mass and lump, unspecified site: Secondary | ICD-10-CM | POA: Diagnosis not present

## 2016-07-15 ENCOUNTER — Telehealth: Payer: Self-pay | Admitting: Internal Medicine

## 2016-07-15 NOTE — Telephone Encounter (Signed)
I spoke with the patient today regarding the results of her pelvic and transvaginal ultrasound. Both imaging modalities did not show pathology and did not visualize the cystic abnormality as documented on the patient's MRI lumbar spine from 06/07/2016. She endorsed understanding of these results.

## 2016-07-16 ENCOUNTER — Encounter (INDEPENDENT_AMBULATORY_CARE_PROVIDER_SITE_OTHER): Payer: Self-pay

## 2016-07-22 ENCOUNTER — Ambulatory Visit (INDEPENDENT_AMBULATORY_CARE_PROVIDER_SITE_OTHER): Payer: Medicaid Other | Admitting: Physical Medicine and Rehabilitation

## 2016-07-22 ENCOUNTER — Encounter (INDEPENDENT_AMBULATORY_CARE_PROVIDER_SITE_OTHER): Payer: Self-pay | Admitting: Physical Medicine and Rehabilitation

## 2016-07-22 ENCOUNTER — Ambulatory Visit (INDEPENDENT_AMBULATORY_CARE_PROVIDER_SITE_OTHER): Payer: Medicaid Other | Admitting: Pharmacist

## 2016-07-22 VITALS — BP 148/115 | HR 67

## 2016-07-22 DIAGNOSIS — M461 Sacroiliitis, not elsewhere classified: Secondary | ICD-10-CM | POA: Diagnosis not present

## 2016-07-22 DIAGNOSIS — D6859 Other primary thrombophilia: Secondary | ICD-10-CM

## 2016-07-22 DIAGNOSIS — Z7901 Long term (current) use of anticoagulants: Secondary | ICD-10-CM | POA: Diagnosis not present

## 2016-07-22 LAB — POCT INR: INR: 2.2

## 2016-07-22 MED ORDER — BUPIVACAINE HCL 0.5 % IJ SOLN
50.0000 mL | Freq: Once | INTRAMUSCULAR | Status: AC
  Administered 2016-07-22: 50 mL

## 2016-07-22 MED ORDER — METHYLPREDNISOLONE ACETATE 80 MG/ML IJ SUSP
80.0000 mg | Freq: Once | INTRAMUSCULAR | Status: AC
  Administered 2016-07-22: 80 mg

## 2016-07-22 MED ORDER — LIDOCAINE HCL (PF) 1 % IJ SOLN
0.3300 mL | Freq: Once | INTRAMUSCULAR | Status: AC
  Administered 2016-07-22: 0.3 mL

## 2016-07-22 NOTE — Progress Notes (Deleted)
   Procedure Note  Patient: Michele Gillespie             Date of Birth: Aug 28, 1976           MRN: [id number]             Visit Date: 07/22/2016  Procedures: Visit Diagnoses: No diagnosis found.  No procedures performed

## 2016-07-22 NOTE — Progress Notes (Signed)
Anti-Coagulation Progress Note  Michele Gillespie is a 40 y.o. female who is currently on an anti-coagulation regimen.    RECENT RESULTS: Recent results are below, the most recent result is correlated with a dose of 107.5 mg. per week: Lab Results  Component Value Date   INR 2.20 07/22/2016   INR 3.50 06/24/2016   INR 2.0 05/28/2016    ANTI-COAG DOSE: Anticoagulation Dose Instructions as of 07/22/2016      [name] Tue Wed Thu Fri Sat   New Dose 15 mg 15 mg 15 mg 17.5 mg 15 mg 15 mg 15 mg       ANTICOAG SUMMARY: Anticoagulation Episode Summary    Current INR goal:   2.0-3.0  TTR:   44.4 % (4.1 y)  Next INR check:   08/26/2016  INR from last check:   2.20 (07/22/2016)  Weekly max dose:     Target end date:   Indefinite  [facility]  Preferred lab:     Send INR reminders to:      Indications   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   FOLLOW-UP Return in 5 weeks (on 08/26/2016) for Follow up INR at 1100h.  [name], III Pharm.D., CACP

## 2016-07-22 NOTE — Procedures (Signed)
Sacroiliac Joint Intra-Articular Injection - Posterior Approach with Fluoroscopic Guidance   Patient: Michele Gillespie      Date of Birth: 1976/05/29 MRN: [id number] PCP: Ophelia Shoulder, MD      Visit Date: 07/22/2016   Universal Protocol:    Date/Time: 07/22/1709:39 AM  Consent Given By: the patient  Position: prone  Additional Comments: Vital signs were monitored before and after the procedure. Patient was prepped and draped in the usual sterile fashion. The correct patient, procedure, and site was verified.   Injection Procedure Details:  Procedure Site One Meds Administered:  Meds ordered this encounter  Medications  . lidocaine (PF) (XYLOCAINE) 1 % injection 0.3 mL  . bupivacaine (MARCAINE) 0.5 % (with pres) injection 50 mL  . methylPREDNISolone acetate (DEPO-MEDROL) injection 80 mg     Laterality: Left  Location/Site:  Sacrum  Needle size: 22 G  Needle type: spinal needle  Needle Placement: joint   Findings:  -Contrast Used: 2 mL iohexol 180 mg iodine/mL   -Comments: Excellent flow of contrast producing a partial arthrogram.  Procedure Details: Starting with a 90 degree vertical and midline orientation the fluoroscope was tilted cranially 20 to 25 degrees and the target area of the inferior most part of the SI joint on the side mentioned above was visualized.  The soft tissues overlying this target were infiltrated with 4 ml. of 1% Lidocaine without Epinephrine. A #22 gauge spinal needle was inserted perpendicular to the fluoroscope table and advanced into the posterior inferior joint space using fluoroscopic guidance.  Position in the joint space was confirmed by obtaining a partial arthrogram using a 2 ml. volume of Isovue-250 contrast agent. After negative aspirate for gross pus or blood, the injectate was delivered to the joint. Radiographs were obtained for documentation purposes.   Additional Comments:  The patient tolerated the procedure well Dressing:  Band-Aid    Post-procedure details: Patient was observed during the procedure. Post-procedure instructions were reviewed. Follow-Up Instructions: Patient left the clinic in stable condition.

## 2016-07-22 NOTE — Progress Notes (Signed)
INTERNAL MEDICINE TEACHING ATTENDING ADDENDUM - Michele Groves, DO Duration- indefinate, Indication- recurrent VTE, INR-  Lab Results  Component Value Date   INR 2.20 07/22/2016  . Agree with pharmacy recommendations as outlined in their note.

## 2016-07-22 NOTE — Progress Notes (Signed)
Office Visit Note   Patient: Michele Gillespie           Date of Birth: 1976-07-27           MRN: [id number] Visit Date: 07/22/2016              Requested by: Ophelia Shoulder, MD [address] [place], [place] [address] PCP: Ophelia Shoulder, MD   Assessment & Plan: Visit Diagnoses:  1. Sacroiliitis ([place])     Plan: Sacroiliac joint injection.  Follow-Up Instructions: No Follow-up on file.   Orders:  Orders Placed This Encounter  Procedures  . Nerve Block   Meds ordered this encounter  Medications  . lidocaine (PF) (XYLOCAINE) 1 % injection 0.3 mL  . bupivacaine (MARCAINE) 0.5 % (with pres) injection 50 mL  . methylPREDNISolone acetate (DEPO-MEDROL) injection 80 mg      Procedures: No procedures performed Sacroiliac Joint Intra-Articular Injection - Posterior Approach with Fluoroscopic Guidance   Patient: Michele Gillespie      Date of Birth: 02-25-1976 MRN: [id number] PCP: Ophelia Shoulder, MD      Visit Date: 07/22/2016   Universal Protocol:    Date/Time: 10/23/171:01 PM  Consent Given By: the patient  Position: prone  Additional Comments: Vital signs were monitored before and after the procedure. Patient was prepped and draped in the usual sterile fashion. The correct patient, procedure, and site was verified.   Injection Procedure Details:  Procedure Site One Meds Administered:  Meds ordered this encounter  Medications  . lidocaine (PF) (XYLOCAINE) 1 % injection 0.3 mL  . bupivacaine (MARCAINE) 0.5 % (with pres) injection 50 mL  . methylPREDNISolone acetate (DEPO-MEDROL) injection 80 mg     Laterality: Left  Location/Site:  Sacral Spine - SI joint  Needle size: 22 G  Needle type: spinal needle  Needle Placement: Joint   Findings:  -Contrast Used: 2 mL iohexol 180 mg iodine/mL   -Comments: Excellent flow of contrast producing a partial arthrogram.  Procedure Details: Starting with a 90 degree vertical and midline orientation the fluoroscope was tilted  cranially 20 to 25 degrees and the target area of the inferior most part of the SI joint on the side mentioned above was visualized.  The soft tissues overlying this target were infiltrated with 4 ml. of 1% Lidocaine without Epinephrine. A #22 gauge spinal needle was inserted perpendicular to the fluoroscope table and advanced into the posterior inferior joint space using fluoroscopic guidance.  Position in the joint space was confirmed by obtaining a partial arthrogram using a 2 ml. volume of Isovue-250 contrast agent. After negative aspirate for gross pus or blood, the injectate was delivered to the joint. Radiographs were obtained for documentation purposes.   Additional Comments:  The patient tolerated the procedure well Dressing: Band-Aid    Post-procedure details: Patient was observed during the procedure. Post-procedure instructions were reviewed. Follow-Up Instructions: prn Patient left the clinic in stable condition.    Clinical Data: No additional findings.   Subjective: Chief Complaint  Patient presents with  . Lower Back - Pain    HPI LBP x several months. Worse over time. Constant pain. Worse with walking and standing long periods and bending. Radiating down the left leg to knee. N&T-left foot. Taking tylenol which helps some.  Taking warfarin with no d/c  Has driver  Review of Systems   Objective: Vital Signs: BP (!) 148/115   Pulse 67   SpO2 100%   Physical Exam  Ortho Exam  Specialty  Comments:  No specialty comments available.  Imaging: US Transvaginal Non-ob  Result Date: 07/10/2016 CLINICAL DATA:  Pelvic mass in a female.  History of C-sections. EXAM: TRANSABDOMINAL AND TRANSVAGINAL ULTRASOUND OF PELVIS TECHNIQUE: Both transabdominal and transvaginal ultrasound examinations of the pelvis were performed. Transabdominal technique was performed for global imaging of the pelvis including uterus, ovaries, adnexal regions, and pelvic cul-de-sac. It was  necessary to proceed with endovaginal exam following the transabdominal exam to visualize the uterus, endometrium and ovaries to better advantage. COMPARISON:  03/09/2014 FINDINGS: Uterus Measurements: 8.5 x 4.2 x 5.0 cm. No fibroids or other mass visualized. Endometrium Thickness: 7.9 mm. Small amount of fluid within the lower endometrial canal in upper inner cervical canal. No mass visualized. Right ovary Measurements: 2.0 x 1.1 x 2.7 cm. Normal appearance/no adnexal mass. Left ovary Not visualized due to significant left adnexal bowel gas. No left adnexal mass. Other findings No abnormal free fluid. IMPRESSION: 1. No acute findings.  No findings to account for pelvic pain. 2. Normal uterus and left ovary.  Right ovary not visualized. Electronically Signed   By: Lajean Manes M.D.   On: 07/10/2016 14:37   US Pelvis Complete  Result Date: 07/10/2016 CLINICAL DATA:  Pelvic mass in a female.  History of C-sections. EXAM: TRANSABDOMINAL AND TRANSVAGINAL ULTRASOUND OF PELVIS TECHNIQUE: Both transabdominal and transvaginal ultrasound examinations of the pelvis were performed. Transabdominal technique was performed for global imaging of the pelvis including uterus, ovaries, adnexal regions, and pelvic cul-de-sac. It was necessary to proceed with endovaginal exam following the transabdominal exam to visualize the uterus, endometrium and ovaries to better advantage. COMPARISON:  03/09/2014 FINDINGS: Uterus Measurements: 8.5 x 4.2 x 5.0 cm. No fibroids or other mass visualized. Endometrium Thickness: 7.9 mm. Small amount of fluid within the lower endometrial canal in upper inner cervical canal. No mass visualized. Right ovary Measurements: 2.0 x 1.1 x 2.7 cm. Normal appearance/no adnexal mass. Left ovary Not visualized due to significant left adnexal bowel gas. No left adnexal mass. Other findings No abnormal free fluid. IMPRESSION: 1. No acute findings.  No findings to account for pelvic pain. 2. Normal uterus and  left ovary.  Right ovary not visualized. Electronically Signed   By: Lajean Manes M.D.   On: 07/10/2016 14:37     PMFS History: Patient Active Problem List   Diagnosis Date Noted  . Depression 06/20/2016  . Preventative health care 06/20/2016  . Pelvic mass in female 06/20/2016  . Chest pain 02/15/2016  . Secondary amenorrhea 09/14/2014  . Allergic rhinitis with Asthma. 09/14/2014  . GERD (gastroesophageal reflux disease) 09/14/2013  . Breast mass, right 09/14/2013  . Healthcare maintenance 09/14/2013  . Cervical mass 02/08/2013  . H/O cesarean section 12/21/2012  . Tobacco use disorder 06/22/2012  . Long term current use of anticoagulant 10/20/2010  . DEPRESSION, HX OF 05/16/2010  . Iron deficiency anemia 10/12/2006  . LEUKOCYTOPENIA NOS 10/12/2006  . [place], PRIMARY 10/12/2006   Past Medical History:  Diagnosis Date  . Anemia 2007    microcytic anemia, baseline hemoglobin 10-11, MCV at baseline 72-77, secondary to iron deficiency  . Arm DVT (deep venous thromboembolism), acute [facility])  September 23, 2009, January 05, 2010    Doppler study significant with indeterminant age DVT involving the left upper extremity, Doppler performed January 05, 2010 consistent with acute DVT involving the left upper extremity  . Asthma   . Depression   . Leukopenia 2008    unclear etiology baseline WBC  2.8-3.7  .  Lung nodule  June 10, 2008    stable tiny noduke noted along the minor fissure of the right lung on CT angio September 11, 09 -  stable for 2 years and consistent with benign disease  . Pulmonary embolism [facility])  September 07, 2005    her CT angiogram - positive for pulmonary emboli to several branches of the right lower lobe- relatively small clot burden, clear lung; patient started on Coumadin; CT angiogram on January 10, 2006 showed resolution of previously seen pulmonary emboli with minimal basilar atelectasis  . Schizophrenia ([place])     Family History  Problem Relation  Age of Onset  . Hypertension Mother   . Heart disease Mother   . Diabetes Father   . Birth defects Maternal Aunt   . Birth defects Maternal Uncle   . Diabetes Paternal Grandmother     Past Surgical History:  Procedure Laterality Date  . CESAREAN SECTION      History of 5 C-section  . EXPLORATORY LAPAROTOMY WITH ABDOMINAL MASS EXCISION  02/2005  . TUBAL LIGATION     Social History   Occupational History  . Not on file.   Social History Main Topics  . Smoking status: Current Every Day Smoker    Packs/day: 0.50    Years: 0.00    Types: Cigarettes  . Smokeless tobacco: Never Used     Comment: about 1/2 pack a day Increased   . Alcohol use 0.0 oz/week  . Drug use:     Types: Marijuana  . Sexual activity: Yes    Birth control/ protection: None

## 2016-07-22 NOTE — Patient Instructions (Signed)
Patient instructed to take medications as defined in the Anti-coagulation Track section of this encounter.  Patient instructed to take today's dose.  Patient verbalized understanding of these instructions.    

## 2016-08-02 ENCOUNTER — Encounter (INDEPENDENT_AMBULATORY_CARE_PROVIDER_SITE_OTHER): Payer: Self-pay | Admitting: Orthopedic Surgery

## 2016-08-07 ENCOUNTER — Telehealth: Payer: Self-pay | Admitting: *Deleted

## 2016-08-07 NOTE — Telephone Encounter (Signed)
Received faxed alternative request from pt's pharmacy for her pataday 0.2% eye drops-with the following message " Please send in new rx for formulary alternative  Pazeo or complete prior authorization request for Pataday."   Will send info to pcp for review and medication change if appropriate.  Please advise.Regenia Skeeter, Aybree Lanyon Cassady11/8/201712:00 PM

## 2016-08-08 MED ORDER — OLOPATADINE HCL 0.7 % OP SOLN: 1.0000 [drp] | Freq: Once | OPHTHALMIC | 0 refills | Status: AC

## 2016-08-08 NOTE — Telephone Encounter (Signed)
I have changed the patient's eyedrop prescription to one that is covered on her formulary.

## 2016-08-12 ENCOUNTER — Ambulatory Visit (INDEPENDENT_AMBULATORY_CARE_PROVIDER_SITE_OTHER): Payer: Medicaid Other | Admitting: Pharmacist

## 2016-08-12 DIAGNOSIS — D6859 Other primary thrombophilia: Secondary | ICD-10-CM

## 2016-08-12 DIAGNOSIS — Z7901 Long term (current) use of anticoagulants: Secondary | ICD-10-CM

## 2016-08-12 LAB — POCT INR: INR: 2.5

## 2016-08-12 NOTE — Progress Notes (Signed)
Indication: Recurrent venous thromboembolism. Duration: Indefinite. INR: At target. Agree with Dr. Kim's assessment and plan. 

## 2016-08-12 NOTE — Progress Notes (Signed)
Anticoagulation Management Michele Gillespie is a 40 y.o. female who reports to the clinic for monitoring of warfarin treatment.    Indication: DVT, PE and hypercoagulable state Duration: indefinite  Anticoagulation Clinic Visit History: Patient does not report signs/symptoms of bleeding or thromboembolism   Anticoagulation Episode Summary    Current INR goal:   2.0-3.0  TTR:   45.2 % (4.2 y)  Next INR check:   09/02/2016  INR from last check:   2.5 (08/12/2016)  Weekly max dose:     Target end date:   Indefinite  [facility] check [facility]   [facility]  Preferred lab:     Send INR reminders to:      Indications   HYPERCOAGULABLE STATE PRIMARY [D68.59] Long term current use of anticoagulant [Z79.01]       Comments:          ASSESSMENT Recent Results: The most recent result is correlated with 107.5 mg per week: Lab Results  Component Value Date   INR 2.5 08/12/2016   INR 2.20 07/22/2016   INR 3.50 06/24/2016   Anticoagulation Dosing: INR as of 08/12/2016 and Previous Dosing Information    INR Dt INR Goal [employer] Sun Mon Tue Wed Thu Fri Sat   08/12/2016 2.5 2.0-3.0 107.5 mg 15 mg 15 mg 15 mg 17.5 mg 15 mg 15 mg 15 mg    Anticoagulation Dose Instructions as of 08/12/2016      Total Sun Mon Tue Wed Thu Fri Sat   New Dose 107.5 mg 15 mg 15 mg 15 mg 17.5 mg 15 mg 15 mg 15 mg     (5 mg x 3)  (5 mg x 3)  (5 mg x 3)  (5 mg x 3.5)  (5 mg x 3)  (5 mg x 3)  (5 mg x 3)                           INR today: Therapeutic  PLAN Weekly dose was unchanged  Patient Instructions  Patient educated about medication as defined in this encounter and verbalized understanding by repeating back instructions provided.   Patient advised to contact clinic or seek medical attention if signs/symptoms of bleeding or thromboembolism occur.  Patient verbalized understanding by repeating back information and was advised to contact me if further medication-related questions arise. Patient  was also provided an information handout.  Follow-up Return in about 4 weeks (around 09/09/2016) for Follow up INR around 09/09/16 at 1030am.  Kim,Jennifer J  15 minutes spent face-to-face with the patient during the encounter. 50% of time spent on education. 50% of time was spent on assessment and plan.

## 2016-08-12 NOTE — Patient Instructions (Signed)
Patient educated about medication as defined in this encounter and verbalized understanding by repeating back instructions provided.   

## 2016-08-20 ENCOUNTER — Telehealth: Payer: Self-pay | Admitting: *Deleted

## 2016-08-20 MED ORDER — OLOPATADINE HCL 0.1 % OP SOLN: 1.0000 [drp] | Freq: Two times a day (BID) | OPHTHALMIC | 1 refills | Status: DC

## 2016-08-20 NOTE — Telephone Encounter (Signed)
I have taken care of this

## 2016-08-20 NOTE — Telephone Encounter (Signed)
Fax from [facility] pharmacy - Medicaid has changed their preferred product again. No longer cover Pataday or Pazeo eye drops but will cover Patanol 0.1% generic. Suggested alternative - Olopatadine HCL 0.1% eye drops. Please send new rx. Thanks I called pt - stated she still uses the eye drops.

## 2016-08-20 NOTE — Telephone Encounter (Signed)
Changed medications per insurance request.

## 2016-08-26 ENCOUNTER — Ambulatory Visit: Payer: Medicaid Other

## 2016-08-28 ENCOUNTER — Telehealth: Payer: Self-pay | Admitting: Internal Medicine

## 2016-08-28 NOTE — Telephone Encounter (Signed)
APT. REMINDER CALL, LMTCB °

## 2016-08-29 ENCOUNTER — Encounter: Payer: Medicaid Other | Admitting: Internal Medicine

## 2016-08-29 ENCOUNTER — Encounter: Payer: Self-pay | Admitting: Internal Medicine

## 2016-09-09 ENCOUNTER — Ambulatory Visit (INDEPENDENT_AMBULATORY_CARE_PROVIDER_SITE_OTHER): Payer: Medicaid Other | Admitting: Pharmacist

## 2016-09-09 ENCOUNTER — Ambulatory Visit (HOSPITAL_COMMUNITY)
Admission: RE | Admit: 2016-09-09 | Discharge: 2016-09-09 | Disposition: A | Discharge status: Home / Self Care | Payer: Medicaid Other | Source: Ambulatory Visit | Attending: Internal Medicine | Admitting: Internal Medicine

## 2016-09-09 ENCOUNTER — Ambulatory Visit (INDEPENDENT_AMBULATORY_CARE_PROVIDER_SITE_OTHER): Payer: Medicaid Other | Admitting: Internal Medicine

## 2016-09-09 DIAGNOSIS — M25551 Pain in right hip: Secondary | ICD-10-CM

## 2016-09-09 DIAGNOSIS — M79604 Pain in right leg: Secondary | ICD-10-CM | POA: Insufficient documentation

## 2016-09-09 DIAGNOSIS — Z7901 Long term (current) use of anticoagulants: Secondary | ICD-10-CM

## 2016-09-09 DIAGNOSIS — D6859 Other primary thrombophilia: Secondary | ICD-10-CM

## 2016-09-09 LAB — POCT INR: INR: 2.6

## 2016-09-09 MED ORDER — ARIPIPRAZOLE 10 MG PO TABS: 10.0000 mg | ORAL_TABLET | Freq: Every day | ORAL | 0 refills | Status: DC

## 2016-09-09 MED ORDER — IBUPROFEN 800 MG PO TABS: 800.0000 mg | ORAL_TABLET | Freq: Three times a day (TID) | ORAL | 0 refills | Status: DC | PRN

## 2016-09-09 NOTE — Progress Notes (Signed)
Anti-Coagulation Progress Note  Michele Gillespie is a 40 y.o. female who is currently on an anti-coagulation regimen.    RECENT RESULTS: Recent results are below, the most recent result is correlated with a dose of 107.5 mg. per week: Lab Results  Component Value Date   INR 2.60 09/09/2016   INR 2.5 08/12/2016   INR 2.20 07/22/2016    ANTI-COAG DOSE: Anticoagulation Dose Instructions as of 09/09/2016      [name] Tue Wed Thu Fri Sat   New Dose 15 mg 15 mg 15 mg 17.5 mg 15 mg 15 mg 15 mg    Description   Take 3 tablets by mouth every day--EXCEPT on Wednesdays--take 3&1/2 tablets on Wednesdays.       ANTICOAG SUMMARY: Anticoagulation Episode Summary    Current INR goal:   2.0-3.0  TTR:   46.1 % (4.3 y)  Next INR check:   10/07/2016  INR from last check:   2.60 (09/09/2016)  Weekly max dose:     Target end date:   Indefinite  INR check [facility]   [facility]  Preferred lab:     Send INR reminders to:      Indications   HYPERCOAGULABLE STATE PRIMARY [D68.59] Long term current use of anticoagulant [Z79.01]       Comments:           ANTICOAG TODAY: Anticoagulation Summary  As of 09/09/2016   INR goal:   2.0-3.0  TTR:     Today's INR:   2.60  Next INR check:   10/07/2016  Target end date:   Indefinite   Indications   HYPERCOAGULABLE STATE PRIMARY [D68.59] Long term current use of anticoagulant [Z79.01]        Anticoagulation Episode Summary    INR check location[facility]   [facility]   Preferred lab:      Send INR reminders to:      Comments:         PATIENT INSTRUCTIONS: Patient instructed to take medications as defined in the Anti-coagulation Track section of this encounter.  Patient instructed to take today's dose.  Patient instructed to take 3 tablets by mouth every day--EXCEPT on Wednesdays--take 3&1/2 tablets on Wednesdays.  Patient verbalized understanding of these instructions.    FOLLOW-UP Return in 4 weeks (on 10/07/2016) for Follow up INR at  1030h.  [name], III Pharm.D., CACP

## 2016-09-09 NOTE — Patient Instructions (Signed)
Patient instructed to take medications as defined in the Anti-coagulation Track section of this encounter.  Patient instructed to take today's dose.  Patient instructed to take 3 tablets by mouth every day--EXCEPT on Wednesdays--take 3&1/2 tablets on Wednesdays.  Patient verbalized understanding of these instructions.

## 2016-09-09 NOTE — Patient Instructions (Signed)
You can take ibuprofen 800mg  three times a day as needed for hip pain.

## 2016-09-10 ENCOUNTER — Telehealth: Payer: Self-pay | Admitting: Internal Medicine

## 2016-09-10 NOTE — Telephone Encounter (Signed)
APT. REMINDER CALL, LMTCB °

## 2016-09-11 ENCOUNTER — Encounter: Payer: Medicaid Other | Admitting: Internal Medicine

## 2016-09-11 NOTE — Assessment & Plan Note (Signed)
Assessment: Patient presenting with 3 days of right hip pain. She points to her right outer hip and right groin. On exam she is tender to palpation of her right lateral hip and right groin area. No lymph nodes were palpated in her groin area. She denies steroid use. She has been a longtime smoker since she was 40 years old.  Plan: We'll obtain an x-ray of right hip to rule out a femoral head etiology. If negative can consider NSAIDs, muscle relaxants, physical therapy.

## 2016-09-11 NOTE — Progress Notes (Signed)
   CC: Right leg pain  HPI:  Michele Gillespie is a 40 y.o.  with past medical history as outlined below who presents to clinic for right leg pain that has been going on for the past 3 days. She reports that she was unable to lay on her right side and had to prop her right side with a pillow She states that she first noticed pain when she was walking in her knee gave out. Pain has gotten progressively worse. Pain is located at her right outer hip and inner thigh. Patient is concerned that this has anything to do with a right pelvic cyst that was noted on her MRI, however she was informed that a transvaginal ultrasound ruled out right pelvic cyst.   Past Medical History:  Diagnosis Date  . Anemia 2007    microcytic anemia, baseline hemoglobin 10-11, MCV at baseline 72-77, secondary to iron deficiency  . Arm DVT (deep venous thromboembolism), acute [facility])  September 23, 2009, January 05, 2010    Doppler study significant with indeterminant age DVT involving the left upper extremity, Doppler performed January 05, 2010 consistent with acute DVT involving the left upper extremity  . Asthma   . Depression   . Leukopenia 2008    unclear etiology baseline WBC  2.8-3.7  . Lung nodule  June 10, 2008    stable tiny noduke noted along the minor fissure of the right lung on CT angio September 11, 09 -  stable for 2 years and consistent with benign disease  . Pulmonary embolism [facility])  September 07, 2005    her CT angiogram - positive for pulmonary emboli to several branches of the right lower lobe- relatively small clot burden, clear lung; patient started on Coumadin; CT angiogram on January 10, 2006 showed resolution of previously seen pulmonary emboli with minimal basilar atelectasis  . Schizophrenia ([place])     Review of Systems:  Denies urinary and bowel incontinence. Denies muscle weakness.  Physical Exam:  Vitals:   09/09/16 1432  BP: (!) 150/115  Pulse: 66  Temp: 98 F (36.7 C)  TempSrc: Oral    SpO2: 100%  Weight: 212 lb 4.8 oz (96.3 kg)   Physical Exam  Constitutional: She is oriented to person, place, and time. She appears well-developed and well-nourished. No distress.  HENT:  Head: Normocephalic and atraumatic.  Nose: Nose normal.  Cardiovascular: Normal rate, regular rhythm and normal heart sounds.  Exam reveals no gallop and no friction rub.   No murmur heard. Pulmonary/Chest: Effort normal and breath sounds normal. No respiratory distress. She has no wheezes. She has no rales.  Abdominal: Soft. Bowel sounds are normal. She exhibits no distension. There is no tenderness. There is no rebound and no guarding.  Neurological: She is alert and oriented to person, place, and time.  Skin: Skin is warm and dry. No rash noted. She is not diaphoretic. No erythema. No pallor.  MSK: negative straight leg test b/l. Tender to palpation of rt lateral hip and rt groin. Neg for external rotation of her rt leg at rest or lower limb shortening.    Assessment & Plan:   See Encounters Tab for problem based charting.  Patient discussed with Dr. Evette Doffing

## 2016-09-11 NOTE — Progress Notes (Signed)
Internal Medicine Clinic Attending  Case discussed with Dr. Truong at the time of the visit.  We reviewed the resident's history and exam and pertinent patient test results.  I agree with the assessment, diagnosis, and plan of care documented in the resident's note.  

## 2016-09-12 ENCOUNTER — Encounter: Payer: Self-pay | Admitting: Internal Medicine

## 2016-10-21 ENCOUNTER — Ambulatory Visit (INDEPENDENT_AMBULATORY_CARE_PROVIDER_SITE_OTHER): Payer: Medicaid Other | Admitting: Pharmacist

## 2016-10-21 DIAGNOSIS — Z7901 Long term (current) use of anticoagulants: Secondary | ICD-10-CM

## 2016-10-21 DIAGNOSIS — D6859 Other primary thrombophilia: Secondary | ICD-10-CM

## 2016-10-21 LAB — POCT INR: INR: 2.8

## 2016-10-21 NOTE — Progress Notes (Signed)
INTERNAL MEDICINE TEACHING ATTENDING ADDENDUM - Lalla Brothers M.D  Duration- indefinite, Indication- hypercoagulable state, INR- therapeutic. Agree with pharmacy recommendations as outlined in their note.

## 2016-10-21 NOTE — Progress Notes (Signed)
Anti-Coagulation Progress Note  Michele Gillespie is a 41 y.o. female who is currently on an anti-coagulation regimen.    RECENT RESULTS: Recent results are below, the most recent result is correlated with a dose of 107.5 mg. per week: Lab Results  Component Value Date   INR 2.80 10/21/2016   INR 2.60 09/09/2016   INR 2.5 08/12/2016    ANTI-COAG DOSE: Anticoagulation Dose Instructions as of 10/21/2016      Michele Gillespie Tue Wed Thu Fri Sat   New Dose 15 mg 15 mg 15 mg 17.5 mg 15 mg 15 mg 15 mg    Description   Take 3 tablets by mouth every day--EXCEPT on Wednesdays--take 3&1/2 tablets on Wednesdays.       ANTICOAG SUMMARY: Anticoagulation Episode Summary    Current INR goal:   2.0-3.0  TTR:   47.6 % (4.4 y)  Next INR check:   11/18/2016  INR from last check:   2.80 (10/21/2016)  Weekly max dose:     Target end date:   Indefinite  INR check location[facility]   [facility]  Preferred lab:     Send INR reminders to:      Indications   HYPERCOAGULABLE STATE PRIMARY [D68.59] Long term current use of anticoagulant [Z79.01]       Comments:           ANTICOAG TODAY: Anticoagulation Summary  As of 10/21/2016   INR goal:   2.0-3.0  TTR:     Today's INR:   2.80  Next INR check:   11/18/2016  Target end date:   Indefinite   Indications   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  Patient instructed to take today's dose.  Patient instructed to take 3 tablets of your peach colored 5mg  strength warfarin tablets by mouth once-daily for all days of week--EXCEPT on Wednesdays, on Wednesdays, take 3&1/2 tablets of your 5mg  peach colored  warfarin tablets.  Patient verbalized understanding of these instructions.        FOLLOW-UP Return in 4 weeks (on 11/18/2016) for Follow up INR at 1000h.  Michele Gillespie, III Pharm.D., CACP

## 2016-10-21 NOTE — Patient Instructions (Signed)
Patient instructed to take medications as defined in the Anti-coagulation Track section of this encounter.  Patient instructed to take today's dose.  Patient instructed to take 3 tablets of your peach colored 5mg  strength warfarin tablets by mouth once-daily for all days of week--EXCEPT on Wednesdays, on Wednesdays, take 3&1/2 tablets of your 5mg  peach colored warfarin tablets.  Patient verbalized understanding of these instructions.

## 2016-11-06 ENCOUNTER — Other Ambulatory Visit: Payer: Self-pay | Admitting: Internal Medicine

## 2016-11-06 ENCOUNTER — Telehealth: Payer: Self-pay | Admitting: Internal Medicine

## 2016-11-06 DIAGNOSIS — D6859 Other primary thrombophilia: Secondary | ICD-10-CM

## 2016-11-06 DIAGNOSIS — Z7901 Long term (current) use of anticoagulants: Secondary | ICD-10-CM

## 2016-11-06 MED ORDER — OMEPRAZOLE 20 MG PO CPDR: 20.0000 mg | DELAYED_RELEASE_CAPSULE | Freq: Every day | ORAL | 3 refills | Status: DC

## 2016-11-06 NOTE — Telephone Encounter (Signed)
I refilled the patient's prescriptions requested.

## 2016-11-18 ENCOUNTER — Ambulatory Visit: Payer: Medicaid Other

## 2016-12-10 ENCOUNTER — Other Ambulatory Visit: Payer: Self-pay

## 2016-12-11 MED ORDER — ALBUTEROL SULFATE HFA 108 (90 BASE) MCG/ACT IN AERS: INHALATION_SPRAY | RESPIRATORY_TRACT | 0 refills | Status: DC

## 2016-12-15 ENCOUNTER — Emergency Department (HOSPITAL_COMMUNITY)
Admission: EM | Admit: 2016-12-15 | Discharge: 2016-12-15 | Disposition: A | Discharge status: Home / Self Care | Payer: Medicaid Other | Source: Home / Self Care | Attending: Emergency Medicine | Admitting: Emergency Medicine

## 2016-12-15 ENCOUNTER — Encounter (HOSPITAL_COMMUNITY): Payer: Self-pay | Admitting: Emergency Medicine

## 2016-12-15 ENCOUNTER — Encounter (HOSPITAL_COMMUNITY): Payer: Self-pay

## 2016-12-15 ENCOUNTER — Emergency Department (HOSPITAL_COMMUNITY): Payer: Medicaid Other

## 2016-12-15 ENCOUNTER — Ambulatory Visit (HOSPITAL_COMMUNITY)
Admission: RE | Admit: 2016-12-15 | Discharge: 2016-12-15 | Disposition: A | Discharge status: Home / Self Care | Payer: Medicaid Other | Source: Ambulatory Visit | Attending: Emergency Medicine | Admitting: Emergency Medicine

## 2016-12-15 ENCOUNTER — Emergency Department (HOSPITAL_COMMUNITY)
Admission: EM | Admit: 2016-12-15 | Discharge: 2016-12-15 | Disposition: A | Discharge status: Home / Self Care | Payer: Medicaid Other | Attending: Emergency Medicine | Admitting: Emergency Medicine

## 2016-12-15 DIAGNOSIS — I82A12 Acute embolism and thrombosis of left axillary vein: Secondary | ICD-10-CM

## 2016-12-15 DIAGNOSIS — M79609 Pain in unspecified limb: Secondary | ICD-10-CM | POA: Diagnosis not present

## 2016-12-15 DIAGNOSIS — Z7901 Long term (current) use of anticoagulants: Secondary | ICD-10-CM | POA: Insufficient documentation

## 2016-12-15 DIAGNOSIS — M7989 Other specified soft tissue disorders: Secondary | ICD-10-CM

## 2016-12-15 DIAGNOSIS — M79602 Pain in left arm: Secondary | ICD-10-CM | POA: Diagnosis present

## 2016-12-15 DIAGNOSIS — F17210 Nicotine dependence, cigarettes, uncomplicated: Secondary | ICD-10-CM | POA: Insufficient documentation

## 2016-12-15 DIAGNOSIS — I82622 Acute embolism and thrombosis of deep veins of left upper extremity: Secondary | ICD-10-CM

## 2016-12-15 DIAGNOSIS — J45909 Unspecified asthma, uncomplicated: Secondary | ICD-10-CM | POA: Diagnosis not present

## 2016-12-15 DIAGNOSIS — M79642 Pain in left hand: Secondary | ICD-10-CM | POA: Insufficient documentation

## 2016-12-15 DIAGNOSIS — R0689 Other abnormalities of breathing: Secondary | ICD-10-CM | POA: Insufficient documentation

## 2016-12-15 DIAGNOSIS — Z86718 Personal history of other venous thrombosis and embolism: Secondary | ICD-10-CM | POA: Insufficient documentation

## 2016-12-15 LAB — CBC
HEMATOCRIT: 36.6 % (ref 36.0–46.0)
HEMOGLOBIN: 12 g/dL (ref 12.0–15.0)
MCH: 25.4 pg — ABNORMAL LOW (ref 26.0–34.0)
MCHC: 32.8 g/dL (ref 30.0–36.0)
MCV: 77.4 fL — ABNORMAL LOW (ref 78.0–100.0)
Platelets: 169 10*3/uL (ref 150–400)
RBC: 4.73 MIL/uL (ref 3.87–5.11)
RDW: 15.4 % (ref 11.5–15.5)
WBC: 3.8 10*3/uL — AB (ref 4.0–10.5)

## 2016-12-15 LAB — BASIC METABOLIC PANEL
ANION GAP: 7 (ref 5–15)
BUN: 13 mg/dL (ref 6–20)
CHLORIDE: 105 mmol/L (ref 101–111)
CO2: 25 mmol/L (ref 22–32)
Calcium: 9 mg/dL (ref 8.9–10.3)
Creatinine, Ser: 0.78 mg/dL (ref 0.44–1.00)
GFR calc non Af Amer: >60 mL/min (ref >60)
Glucose, Bld: 93 mg/dL (ref 65–99)
POTASSIUM: 3.5 mmol/L (ref 3.5–5.1)
Sodium: 137 mmol/L (ref 135–145)

## 2016-12-15 LAB — PROTIME-INR
INR: 1.26
Prothrombin Time: 15.9 seconds — ABNORMAL HIGH (ref 11.4–15.2)

## 2016-12-15 LAB — I-STAT TROPONIN, ED: TROPONIN I, POC: 0 ng/mL (ref 0.00–0.08)

## 2016-12-15 LAB — I-STAT BETA HCG BLOOD, ED (MC, WL, AP ONLY)

## 2016-12-15 LAB — BRAIN NATRIURETIC PEPTIDE: B NATRIURETIC PEPTIDE 5: 18.3 pg/mL (ref 0.0–100.0)

## 2016-12-15 MED ORDER — WARFARIN - PHARMACIST DOSING INPATIENT: Freq: Every day | Status: DC

## 2016-12-15 MED ORDER — WARFARIN SODIUM 10 MG PO TABS
20.0000 mg | ORAL_TABLET | Freq: Once | ORAL | Status: AC
  Administered 2016-12-15: 20 mg via ORAL
  Filled 2016-12-15: qty 2

## 2016-12-15 MED ORDER — ENOXAPARIN SODIUM 150 MG/ML ~~LOC~~ SOLN: 150.0000 mg | SUBCUTANEOUS | 0 refills | Status: DC

## 2016-12-15 MED ORDER — WARFARIN SODIUM 10 MG PO TABS
20.0000 mg | ORAL_TABLET | Freq: Once | ORAL | Status: DC
  Filled 2016-12-15: qty 2

## 2016-12-15 MED ORDER — ENOXAPARIN (LOVENOX) PATIENT EDUCATION KIT
PACK | Freq: Once | Status: AC
  Administered 2016-12-15: 18:00:00
  Filled 2016-12-15: qty 1

## 2016-12-15 MED ORDER — ENOXAPARIN SODIUM 150 MG/ML ~~LOC~~ SOLN
1.5000 mg/kg | Freq: Once | SUBCUTANEOUS | Status: AC
  Administered 2016-12-15: 145 mg via SUBCUTANEOUS
  Filled 2016-12-15: qty 0.96

## 2016-12-15 MED ORDER — ENOXAPARIN SODIUM 150 MG/ML ~~LOC~~ SOLN: 1.5000 mg/kg | SUBCUTANEOUS | Status: DC

## 2016-12-15 MED ORDER — IOPAMIDOL (ISOVUE-370) INJECTION 76%
INTRAVENOUS | Status: AC
  Administered 2016-12-15: 100 mL
  Filled 2016-12-15: qty 100

## 2016-12-15 NOTE — ED Triage Notes (Signed)
Pt. Stated, I just had an [place] and it was positive in left arm.  c/o in left arm and hand.

## 2016-12-15 NOTE — ED Notes (Signed)
Patient transported to CT 

## 2016-12-15 NOTE — Discharge Instructions (Signed)
Please read the attached information.  Please use Lovenox tomorrow morning at 5 AM.  Please follow-up with the Coumadin clinic tomorrow as previously scheduled.  Please inform them of today's visit and all relevant data including Lovenox dosing and need for Coumadin.  Please return immediately if you develop any new or worsening signs or symptoms.

## 2016-12-15 NOTE — ED Notes (Signed)
Pt ambulated to room, refusing wheelchair. Pt had no complaints while ambulating. Placed pt on monitor.

## 2016-12-15 NOTE — ED Triage Notes (Signed)
Pt complaining of L arm pain x 1 day. Pt states has not had coumadin x 3 days. Pt states takes coumadin for blood clots in arm. Pt states has rx, has not had time to fill.

## 2016-12-15 NOTE — Progress Notes (Signed)
VASCULAR LAB PRELIMINARY  PRELIMINARY  PRELIMINARY  PRELIMINARY  Left upper extremity venous duplex completed.    Preliminary report:  There is acute, partially occlusive DVT noted in the left axillary and brachial veins.    Nicha Hemann, RVT 12/15/2016, 1:10 PM

## 2016-12-15 NOTE — ED Provider Notes (Signed)
[facility] DEPT Provider Note   CSN: [id number] Arrival date & time: 12/15/16  2703  By signing my name below, I, Oleh Genin, attest that this documentation has been prepared under the direction and in the presence of Orpah Greek, MD. Electronically Signed: Oleh Genin, Scribe. 12/15/16. 2:20 AM.   History   Chief Complaint Chief Complaint  Patient presents with  . Arm Pain    HPI Michele Gillespie is a 41 y.o. 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 This patient states that she has been recently visiting a friend in the hospital and has forgotten to take her regular Coumadin dose for 3 days. She is currently reporting pain over the L bicep without swelling. She denies any chest pain or dyspnea.   The history is provided by the patient. No language interpreter was used.  Arm Pain  This is a new problem. The current episode started 3 to 5 hours ago. The problem occurs constantly. The problem has not changed since onset.Pertinent negatives include no chest pain and no shortness of breath. Treatments tried: Not taking Coumadin.    Past Medical History:  Diagnosis Date  . Anemia 2007    microcytic anemia, baseline hemoglobin 10-11, MCV at baseline 72-77, secondary to iron deficiency  . Arm DVT (deep venous thromboembolism), acute [facility])  September 23, 2009, January 05, 2010    Doppler study significant with indeterminant age DVT involving the left upper extremity, Doppler performed January 05, 2010 consistent with acute DVT involving the left upper extremity  . Asthma   . Depression   . Leukopenia 2008    unclear etiology baseline WBC  2.8-3.7  . Lung nodule  June 10, 2008    stable tiny noduke noted along the minor fissure of the right lung on CT angio September 11, 09 -  stable for 2 years and consistent with benign disease  . Pulmonary embolism [facility])  September 07, 2005    her CT angiogram - positive for pulmonary emboli to several branches of the right lower lobe- relatively small clot burden, clear lung; patient started on Coumadin; CT angiogram on January 10, 2006 showed resolution of previously seen pulmonary emboli with minimal basilar atelectasis  . Schizophrenia [facility])     Patient Active Problem List   Diagnosis Date Noted  . Right hip pain 09/09/2016  . Depression 06/20/2016  . Preventative health care 06/20/2016  . Pelvic mass in female 06/20/2016  . Chest pain 02/15/2016  . Secondary amenorrhea 09/14/2014  . Allergic rhinitis with Asthma. 09/14/2014  . GERD (gastroesophageal reflux disease) 09/14/2013  . Breast mass, right 09/14/2013  . Healthcare maintenance 09/14/2013  . Cervical mass 02/08/2013  . H/O cesarean section 12/21/2012  . Tobacco use disorder 06/22/2012  . Long term current use of anticoagulant 10/20/2010  . DEPRESSION, HX OF 05/16/2010  . Iron deficiency anemia 10/12/2006  . LEUKOCYTOPENIA NOS 10/12/2006  . HYPERCOAGULABLE STATE, PRIMARY 10/12/2006    Past Surgical History:  Procedure Laterality Date  . CESAREAN SECTION      History of 5 C-section  . EXPLORATORY LAPAROTOMY WITH ABDOMINAL MASS EXCISION  02/2005  . TUBAL LIGATION      OB History    Gravida Para Term Preterm AB Living   5 4 4  0 0 5   SAB TAB Ectopic Multiple Live Births   0 0 0 0  Home Medications    Prior to Admission medications   Medication Sig Start Date End Date Taking? Authorizing Provider  albuterol (PROVENTIL HFA) 108 (90 Base) MCG/ACT inhaler INHALE 2 PUFFS INTO THE LUNGS EVERY 6 HOURS AS NEEDED FOR WHEEZING. FOR SHORTNESS OF BREATH 12/11/16   Ophelia Shoulder, MD  ARIPiprazole (ABILIFY) 10 MG tablet Take 1 tablet (10 mg total) by mouth daily. 09/09/16   [facility] Herrlich, MD  cyclobenzaprine (FLEXERIL) 10 MG tablet Take 1 tablet (10 mg total) by mouth 2 (two) times daily as needed for muscle spasms. 03/27/16   [name], MD    esomeprazole (NEXIUM) 20 MG capsule Take 20 mg by mouth daily at 12 noon.    Historical Provider, MD  HYDROcodone-acetaminophen (NORCO/VICODIN) 5-325 MG tablet Take 1-2 tablets by mouth every 6 (six) hours as needed. 03/27/16   [name], MD  ibuprofen (ADVIL,MOTRIN) 800 MG tablet Take 1 tablet (800 mg total) by mouth every 8 (eight) hours as needed. 09/09/16   [facility] Herrlich, MD  loratadine (CLARITIN) 10 MG tablet TAKE 1 CAPSULE (10 MG TOTAL) BY MOUTH DAILY. Patient taking differently: TAKE 1 CAPSULE (10 MG TOTAL) BY MOUTH DAILY AS NEEDED FOR ALLERGIES 01/22/16   Ejiroghene E Emokpae, MD  nicotine (NICODERM CQ - DOSED IN MG/24 HOURS) 14 mg/24hr patch RX #2 Weeks 5-6: 14 mg x 1 patch daily. Wear for 24 hours. If you have sleep disturbances, remove at bedtime. Patient taking differently: Place 14 mg onto the skin daily as needed (for smoking cessation). RX #2 Weeks 5-6: 14 mg x 1 patch daily. Wear for 24 hours. If you have sleep disturbances, remove at bedtime. 10/20/15   Ejiroghene Arlyce Dice, MD  nicotine polacrilex (NICORETTE) 2 MG gum RX #3 Weeks 10-12: 1 piece every 4-8 hours. Max 24 pieces per day. 10/20/15   Ejiroghene Arlyce Dice, MD  olopatadine (PATANOL) 0.1 % ophthalmic solution Place 1 drop into both eyes 2 (two) times daily. 08/20/16 08/20/17  Ophelia Shoulder, MD  omeprazole (PRILOSEC) 20 MG capsule Take 1 capsule (20 mg total) by mouth daily. 11/06/16   Ophelia Shoulder, MD  warfarin (COUMADIN) 5 MG tablet Take 4 tablets (20mg ) on Wednesdays and Fridays; take 3 tablets (15mg ) all other days. 05/28/16   Oval Linsey, MD  warfarin (COUMADIN) 5 MG tablet TAKE 3 TABLETS BY MOUTH EVERY DAY AT [facility] 11/06/16   Ophelia Shoulder, MD    Family History Family History  Problem Relation Age of Onset  . Hypertension Mother   . Heart disease Mother   . Diabetes Father   . Birth defects Maternal Aunt   . Birth defects Maternal Uncle   . Diabetes Paternal Grandmother     Social History Social History   Substance Use Topics  . Smoking status: Current Every Day Smoker    Packs/day: 0.25    Years: 0.00    Types: Cigarettes  . Smokeless tobacco: Never Used     Comment: about 1/2 pack a day Increased   . Alcohol use 0.0 oz/week     Allergies   Patient has no known allergies.   Review of Systems Review of Systems  Respiratory: Negative for shortness of breath.   Cardiovascular: Negative for chest pain.  All other systems reviewed and are negative.    Physical Exam Updated Vital Signs BP 120/90   Pulse 80   Temp 98 F (36.7 C) (Oral)   Resp 18   LMP 11/15/2016 (Exact Date)   SpO2 94%  Physical Exam  Constitutional: She is oriented to person, place, and time. She appears well-developed and well-nourished. No distress.  HENT:  Head: Normocephalic and atraumatic.  Right Ear: Hearing normal.  Left Ear: Hearing normal.  Nose: Nose normal.  Mouth/Throat: Oropharynx is clear and moist and mucous membranes are normal.  Eyes: Conjunctivae and EOM are normal. Pupils are equal, round, and reactive to light.  Neck: Normal range of motion. Neck supple.  Cardiovascular: Regular rhythm, S1 normal and S2 normal.  Exam reveals no gallop and no friction rub.   No murmur heard. Pulmonary/Chest: Effort normal and breath sounds normal. No respiratory distress. She exhibits no tenderness.  Abdominal: Soft. Normal appearance and bowel sounds are normal. There is no hepatosplenomegaly. There is no tenderness. There is no rebound, no guarding, no tenderness at McBurney's point and negative Murphy's sign. No hernia.  Musculoskeletal: Normal range of motion.  Diffuse tenderness over the L bicep without swelling.   Neurological: She is alert and oriented to person, place, and time. She has normal strength. No cranial nerve deficit or sensory deficit. Coordination normal. GCS eye subscore is 4. GCS verbal subscore is 5. GCS motor subscore is 6.  Skin: Skin is warm, dry and intact. No rash noted.  No cyanosis.  Psychiatric: She has a normal mood and affect. Her speech is normal and behavior is normal. Thought content normal.  Nursing note and vitals reviewed.    ED Treatments / Results  Labs (all labs ordered are listed, but only abnormal results are displayed) Labs Reviewed  CBC - Abnormal; Notable for the following:       Result Value   WBC 3.8 (*)    MCV 77.4 (*)    MCH 25.4 (*)    All other components within normal limits  PROTIME-INR - Abnormal; Notable for the following:    Prothrombin Time 15.9 (*)    All other components within normal limits  BASIC METABOLIC PANEL    EKG  EKG Interpretation None       Radiology No results found.  Procedures Procedures (including critical care time)  Medications Ordered in ED Medications  enoxaparin (LOVENOX) injection 145 mg (not administered)     Initial Impression / Assessment and Plan / ED Course  I have reviewed the triage vital signs and the nursing notes.  Pertinent labs & imaging results that were available during my care of the patient were reviewed by me and considered in my medical decision making (see chart for details).     Patient presents to the emergency para complaints of left arm pain. Patient denies any injury. She is concerned because of a history of DVT in the arm. Patient has been out of her Coumadin for 3 days. Her INR is 1.26, subtherapeutic. Patient reports that her doctor did call in a prescription and she does have access to the Coumadin now. She is not experiencing any symptoms of PE. Patient administered Lovenox here in the ER and will have outpatient DVT study performed. Restart her Coumadin immediately. If DVT study is positive, will need additional Lovenox until therapeutic INR is achieved.  Final Clinical Impressions(s) / ED Diagnoses   Final diagnoses:  Left arm pain    New Prescriptions New Prescriptions   No medications on file  I personally performed the services described  in this documentation, which was scribed in my presence. The recorded information has been reviewed and is accurate.    Orpah Greek, MD 12/15/16 [phone]

## 2016-12-15 NOTE — ED Notes (Signed)
ED Provider at bedside. 

## 2016-12-15 NOTE — Progress Notes (Addendum)
ANTICOAGULATION CONSULT NOTE - Initial Consult  Pharmacy Consult for Lovenox Indication: DVT of L- axillary and brachial veins  No Known Allergies  Patient Measurements: Height: 5' 4.5" (163.8 cm) Weight: 220 lb (99.8 kg) IBW/kg (Calculated) : 55.85  Vital Signs: Temp: 98 F (36.7 C) (03/18 1324) Temp Source: Oral (03/18 1324) BP: 158/105 (03/18 1324) Pulse Rate: 78 (03/18 1324)  Labs:  Recent Labs  12/15/16 0217  HGB 12.0  HCT 36.6  PLT 169  LABPROT 15.9*  INR 1.26  CREATININE 0.78    Estimated Creatinine Clearance: 105.2 mL/min (by C-G formula based on SCr of 0.78 mg/dL).   Medical History: Past Medical History:  Diagnosis Date  . Anemia 2007    microcytic anemia, baseline hemoglobin 10-11, MCV at baseline 72-77, secondary to iron deficiency  . Arm DVT (deep venous thromboembolism), acute [facility])  September 23, 2009, January 05, 2010    Doppler study significant with indeterminant age DVT involving the left upper extremity, Doppler performed January 05, 2010 consistent with acute DVT involving the left upper extremity  . Asthma   . Depression   . Leukopenia 2008    unclear etiology baseline WBC  2.8-3.7  . Lung nodule  June 10, 2008    stable tiny noduke noted along the minor fissure of the right lung on CT angio September 11, 09 -  stable for 2 years and consistent with benign disease  . Pulmonary embolism [facility])  September 07, 2005    her CT angiogram - positive for pulmonary emboli to several branches of the right lower lobe- relatively small clot burden, clear lung; patient started on Coumadin; CT angiogram on January 10, 2006 showed resolution of previously seen pulmonary emboli with minimal basilar atelectasis  . Schizophrenia [facility])    Assessment: 41 year old female who takes chronic warfarin pta for history of LUE DVT in 2011 and prior PE in 2007 now in ED with acute DVT of L- axillary and brachial veins. Her INR is 1.26- subtherapeutic. Patient states she has  missed her Coumadin dose for last 3 days. Pharmacy consulted for Lovenox.  Received Lovenox 1.5 mg/kg at 0450 AM ~145 mg x1.  After discussion with patient and Okey Regal, PA - decision made to send home with Lovenox RX and resume Coumadin with follow-up at [facility] in AM.   Home regimen: 15mg  daily except 17.5mg  on Wednesdays -last dose 12/14/16 per report.  Goal of Therapy:  INR 2-3 Anti-Xa level 0.6-1 units/ml 4hrs after LMWH dose given Monitor platelets by anticoagulation protocol: Yes   Plan:  Lovenox 150 mg (1.5 mg/kg) SQ daily - next dose due 12/16/16 at 0450 AM.  Recommend restart Warfarin 20 mg po today if has not had dose today and follow up with clinic tomorrow.  Plans to follow up with Internal Medicine Coumadin clinic at 9 AM tomorrow.    Sloan Leiter, PharmD, BCPS Clinical Pharmacist Clinical Phone 12/15/2016 until 3:30 PM - [phone] After hours, please call [phone] 12/15/2016,2:29 PM

## 2016-12-15 NOTE — ED Provider Notes (Signed)
[place] DEPT Provider Note   CSN: [id number] Arrival date & time: 12/15/16  1317     History   Chief Complaint Chief Complaint  Patient presents with  . DVT  . Arm Pain  . Hand Pain    HPI Michele Gillespie is a 41 y.o. female.  HPI   41 year old female presents today with DVT in her left arm.  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  Patient was seen in the emergency room earlier this morning and scheduled for an outpatient DVT study.  She is reporting left arm pain, worse in her hand.  She notes this is from the proximal shoulder all the way down to the fingers.  Patient notes that she has distal sensation strength and motor function.  Patient notes she is seen at the Coumadin clinic, seen in internal medicine by Dr. Lovena Le.  She notes she has not been taking her Coumadin over the last 3 days due to family members being ill and in the hospital.  Patient does report she does have Coumadin prescription waiting for her to be picked up.  Patient's ultrasound showed acute partially occlusive DVT in the left axillary and brachial veins.  Patient was referred back to the emergency room for lovenox.  Patient denies any complicating features including chest pain shortness of breath, no signs of PE.   Patient's PT 15.9/INR 1.26 2 AM this morning  Goal 2.0-3.0    Past Medical History:  Diagnosis Date  . Anemia 2007    microcytic anemia, baseline hemoglobin 10-11, MCV at baseline 72-77, secondary to iron deficiency  . Arm DVT (deep venous thromboembolism), acute [facility])  September 23, 2009, January 05, 2010    Doppler study significant with indeterminant age DVT involving the left upper extremity, Doppler performed January 05, 2010 consistent with acute DVT involving the left upper extremity  . Asthma   . Depression   . Leukopenia 2008    unclear etiology baseline WBC  2.8-3.7  . Lung nodule  June 10, 2008    stable tiny  noduke noted along the minor fissure of the right lung on CT angio September 11, 09 -  stable for 2 years and consistent with benign disease  . Pulmonary embolism [facility])  September 07, 2005    her CT angiogram - positive for pulmonary emboli to several branches of the right lower lobe- relatively small clot burden, clear lung; patient started on Coumadin; CT angiogram on January 10, 2006 showed resolution of previously seen pulmonary emboli with minimal basilar atelectasis  . Schizophrenia [facility])     Patient Active Problem List   Diagnosis Date Noted  . Right hip pain 09/09/2016  . Depression 06/20/2016  . Preventative health care 06/20/2016  . Pelvic mass in female 06/20/2016  . Chest pain 02/15/2016  . Secondary amenorrhea 09/14/2014  . Allergic rhinitis with Asthma. 09/14/2014  . GERD (gastroesophageal reflux disease) 09/14/2013  . Breast mass, right 09/14/2013  . Healthcare maintenance 09/14/2013  . Cervical mass 02/08/2013  . H/O cesarean section 12/21/2012  . Tobacco use disorder 06/22/2012  . Long term current use of anticoagulant 10/20/2010  . DEPRESSION, HX OF 05/16/2010  . Iron deficiency anemia 10/12/2006  . LEUKOCYTOPENIA NOS 10/12/2006  . HYPERCOAGULABLE STATE, PRIMARY 10/12/2006    Past Surgical History:  Procedure Laterality Date  . CESAREAN SECTION      History of 5 C-section  . EXPLORATORY LAPAROTOMY WITH ABDOMINAL  MASS EXCISION  02/2005  . TUBAL LIGATION      OB History    Gravida Para Term Preterm AB Living   5 4 4  0 0 5   SAB TAB Ectopic Multiple Live Births   0 0 0 0         Home Medications    Prior to Admission medications   Medication Sig Start Date End Date Taking? Authorizing Provider  albuterol (PROVENTIL HFA) 108 (90 Base) MCG/ACT inhaler INHALE 2 PUFFS INTO THE LUNGS EVERY 6 HOURS AS NEEDED FOR WHEEZING. FOR SHORTNESS OF BREATH Patient taking differently: Inhale 2 puffs into the lungs every 6 (six) hours as needed for shortness of breath.   12/11/16  Yes Ophelia Shoulder, MD  olopatadine (PATANOL) 0.1 % ophthalmic solution Place 1 drop into both eyes 2 (two) times daily. 08/20/16 08/20/17 Yes Ophelia Shoulder, MD  warfarin (COUMADIN) 5 MG tablet Take 4 tablets (49m) on Wednesdays and Fridays; take 3 tablets (122m all other days. Patient taking differently: Take 15-17.5 mg by mouth daily at 6 PM. Take 3 1/2 tablets (17.59m759mon Wednesdays, and 3 tablets (159m22mll other days. 05/28/16  Yes LawrOval Linsey  ARIPiprazole (ABILIFY) 10 MG tablet Take 1 tablet (10 mg total) by mouth daily. 09/09/16   DianNorman Herrlich  cyclobenzaprine (FLEXERIL) 10 MG tablet Take 1 tablet (10 mg total) by mouth 2 (two) times daily as needed for muscle spasms. Patient not taking: Reported on 12/15/2016 03/27/16   WhitBlanchie Dessert  enoxaparin (LOVENOX) 150 MG/ML injection Inject 1 mL (150 mg total) into the skin daily. 12/15/16   JeffOkey Regal-C  enoxaparin (LOVENOX) 150 MG/ML injection Inject 1 mL (150 mg total) into the skin daily. 12/15/16   JeffOkey Regal-C  HYDROcodone-acetaminophen (NORCO/VICODIN) 5-325 MG tablet Take 1-2 tablets by mouth every 6 (six) hours as needed. Patient not taking: Reported on 12/15/2016 03/27/16   WhitBlanchie Dessert  ibuprofen (ADVIL,MOTRIN) 800 MG tablet Take 1 tablet (800 mg total) by mouth every 8 (eight) hours as needed. Patient not taking: Reported on 12/15/2016 09/09/16   DianNorman Herrlich  loratadine (CLARITIN) 10 MG tablet TAKE 1 CAPSULE (10 MG TOTAL) BY MOUTH DAILY. Patient not taking: Reported on 12/15/2016 01/22/16   Ejiroghene E EmokDenton Brick  nicotine (NICODERM CQ - DOSED IN MG/24 HOURS) 14 mg/24hr patch RX #2 Weeks 5-6: 14 mg x 1 patch daily. Wear for 24 hours. If you have sleep disturbances, remove at bedtime. Patient not taking: Reported on 12/15/2016 10/20/15   Ejiroghene E EmArlyce Dice  nicotine polacrilex (NICORETTE) 2 MG gum RX #3 Weeks 10-12: 1 piece every 4-8 hours. Max 24 pieces per day. Patient not taking:  Reported on 12/15/2016 10/20/15   Ejiroghene E EmArlyce Dice  omeprazole (PRILOSEC) 20 MG capsule Take 1 capsule (20 mg total) by mouth daily. 11/06/16   JameOphelia Shoulder  warfarin (COUMADIN) 5 MG tablet TAKE 3 TABLETS BY MOUTH EVERY DAY AT 6PM Patient not taking: Reported on 12/15/2016 11/06/16   JameOphelia Shoulder    Family History Family History  Problem Relation Age of Onset  . Hypertension Mother   . Heart disease Mother   . Diabetes Father   . Birth defects Maternal Aunt   . Birth defects Maternal Uncle   . Diabetes Paternal Grandmother     Social History Social History  Substance Use Topics  . Smoking status: Current Every Day Smoker    Packs/day: 0.25  Years: 0.00    Types: Cigarettes  . Smokeless tobacco: Never Used     Comment: about 1/2 pack a day Increased   . Alcohol use 0.0 oz/week     Allergies   Patient has no known allergies.   Review of Systems Review of Systems  All other systems reviewed and are negative.   Physical Exam Updated Vital Signs BP (!) 187/106   Pulse 72   Temp 98.1 F (36.7 C) (Oral)   Resp 13   Ht 5' 4.5" (1.638 m)   Wt 99.8 kg   LMP 11/15/2016 (Exact Date)   SpO2 100%   BMI 37.18 kg/m   Physical Exam  Constitutional: She is oriented to person, place, and time. She appears well-developed and well-nourished.  HENT:  Head: Normocephalic and atraumatic.  Eyes: Conjunctivae are normal. Pupils are equal, round, and reactive to light. Right eye exhibits no discharge. Left eye exhibits no discharge. No scleral icterus.  Neck: Normal range of motion. No JVD present. No tracheal deviation present.  Pulmonary/Chest: Effort normal. No stridor.  Musculoskeletal:  Left arm tender to palpation from the proximal shoulder to the wrist.  No obvious swelling, radial pulse 2+, warm and well-perfused extremity, sensation intact  Neurological: She is alert and oriented to person, place, and time. Coordination normal.  Psychiatric: She has a normal  mood and affect. Her behavior is normal. Judgment and thought content normal.  Nursing note and vitals reviewed.   ED Treatments / Results  Labs (all labs ordered are listed, but only abnormal results are displayed) Labs Reviewed  BRAIN NATRIURETIC PEPTIDE  I-STAT BETA HCG BLOOD, ED ([facility], [facility], [facility] ONLY)  I-STAT TROPOININ, ED    EKG  EKG Interpretation None       Radiology Ct Angio Chest Pe W And/or Wo Contrast  Result Date: 12/15/2016 CLINICAL DATA:  41 year old female with known blood clot in left upper extremity. Left-sided chest pain and shortness of breath. EXAM: CT ANGIOGRAPHY CHEST WITH CONTRAST TECHNIQUE: Multidetector CT imaging of the chest was performed using the standard protocol during bolus administration of intravenous contrast. Multiplanar CT image reconstructions and MIPs were obtained to evaluate the vascular anatomy. CONTRAST:  100 mL of Isovue 370. COMPARISON:  PE protocol CT scan 09/15/2014. FINDINGS: Cardiovascular: No filling defects within the pulmonary arteries to suggest underlying pulmonary embolism. Heart size is mildly enlarged. There is no significant pericardial fluid, thickening or pericardial calcification. No atherosclerotic calcifications identified in the thoracic aorta or the coronary arteries. Mediastinum/Nodes: No pathologically enlarged mediastinal or hilar lymph nodes. Esophagus is unremarkable in appearance. No axillary lymphadenopathy. Lungs/Pleura: 3 mm pulmonary nodule in the lateral segment of the right middle lobe (image 75 of series 407) unchanged in retrospect compared to prior study 09/15/2014, considered benign. No new suspicious appearing pulmonary nodules or masses. No acute consolidative airspace disease. No pleural effusions. Chronic scarring of medial segment of the right middle lobe an inferior segment of the lingula. Upper Abdomen: Unremarkable. Musculoskeletal: There are no aggressive appearing lytic or blastic lesions noted in the  visualized portions of the skeleton. Review of the MIP images confirms the above findings. IMPRESSION: 1. No evidence of pulmonary embolism. 2. No acute findings in the thorax to account for the patient's symptoms. 3. Mild cardiomegaly. Electronically Signed   By: Vinnie Langton M.D.   On: 12/15/2016 16:59    Procedures Procedures (including critical care time)  Medications Ordered in ED Medications  Warfarin - Pharmacist Dosing Inpatient (not administered)  enoxaparin (  LOVENOX) patient education kit ( Does not apply Given 12/15/16 1730)  iopamidol (ISOVUE-370) 76 % injection (100 mLs  Contrast Given 12/15/16 1606)  warfarin (COUMADIN) tablet 20 mg (20 mg Oral Given 12/15/16 1736)     Initial Impression / Assessment and Plan / ED Course  I have reviewed the triage vital signs and the nursing notes.  Pertinent labs & imaging results that were available during my care of the patient were reviewed by me and considered in my medical decision making (see chart for details).      Final Clinical Impressions(s) / ED Diagnoses   Final diagnoses:  Acute deep vein thrombosis (DVT) of axillary vein of left upper extremity (HCC)  Acute deep vein thrombosis (DVT) of brachial vein of left upper extremity (HCC)    Labs: Troponin, hCG, BNP  Imaging: CT angiogram chest PE with and without contrast  Consults:  Therapeutics: Warfarin  Discharge Meds: Lovenox  Assessment/Plan:   41 year old female presents today with DVT in her left arm.  Patient has a long-standing history of acute DVT seen in the left upper extremity.  She has been noncompliant with her medication over the years.  Patient has not taken her Coumadin the last 3 days and is subtherapeutic.  Patient received Lovenox injection at 4:50 AM today prior to discharge.  Patient is not due for another injection until tomorrow morning at 4:50 AM.  Patient has a follow-up appointment at 9 AM with her primary care provider, which is our  internal medicine clinic.  I see no immediate reason to admit this patient does she is are received the appropriate medications, will not receive any more treatment while in the hospital, and has such close follow-up with her primary care provider.  Pharmacy was consulted to provide prescription and dosing of Lovenox for outpatient management.  I discussed this with the patient at length, she reports that she does not want to be here but is upset as she has never been discharged before.  I explained to the patient that she had artery received her medication for the day and with no acute signs of PE no need for hospitalization.  Patient is requesting to speak with attending physician.    Patient reports that she is having shortness of breath after discussion of disposition.  CT angiogram PE will be performed to rule out pulmonary embolism.    CT shows no acute findings.  I discussed this with the patient.  She will receive a dose of her Coumadin as recommended by pharmacy, will follow up with the Coumadin clinic tomorrow morning after taking her morning Lovenox.  Patient verbalized understanding and agreement to today's plan.  She is given strict return precautions.  She had no further questions or concerns.    New Prescriptions New Prescriptions   ENOXAPARIN (LOVENOX) 150 MG/ML INJECTION    Inject 1 mL (150 mg total) into the skin daily.   ENOXAPARIN (LOVENOX) 150 MG/ML INJECTION    Inject 1 mL (150 mg total) into the skin daily.     Okey Regal, PA-C 12/15/16 1737    Gareth Morgan, MD 12/18/16 2111

## 2016-12-15 NOTE — ED Triage Notes (Addendum)
Filed on incorrect patient.

## 2016-12-16 ENCOUNTER — Ambulatory Visit (INDEPENDENT_AMBULATORY_CARE_PROVIDER_SITE_OTHER): Payer: Medicaid Other | Admitting: Pharmacist

## 2016-12-16 ENCOUNTER — Encounter: Payer: Self-pay | Admitting: Internal Medicine

## 2016-12-16 ENCOUNTER — Ambulatory Visit (INDEPENDENT_AMBULATORY_CARE_PROVIDER_SITE_OTHER): Payer: Medicaid Other | Admitting: Internal Medicine

## 2016-12-16 VITALS — BP 141/98 | HR 78 | Temp 98.0°F

## 2016-12-16 DIAGNOSIS — Z7901 Long term (current) use of anticoagulants: Secondary | ICD-10-CM

## 2016-12-16 DIAGNOSIS — I82622 Acute embolism and thrombosis of deep veins of left upper extremity: Secondary | ICD-10-CM

## 2016-12-16 DIAGNOSIS — R112 Nausea with vomiting, unspecified: Secondary | ICD-10-CM | POA: Diagnosis not present

## 2016-12-16 DIAGNOSIS — D6859 Other primary thrombophilia: Secondary | ICD-10-CM | POA: Diagnosis not present

## 2016-12-16 DIAGNOSIS — Z5189 Encounter for other specified aftercare: Secondary | ICD-10-CM

## 2016-12-16 DIAGNOSIS — I82A12 Acute embolism and thrombosis of left axillary vein: Secondary | ICD-10-CM | POA: Diagnosis not present

## 2016-12-16 DIAGNOSIS — Z9114 Patient's other noncompliance with medication regimen: Secondary | ICD-10-CM | POA: Diagnosis not present

## 2016-12-16 HISTORY — DX: Acute embolism and thrombosis of deep veins of left upper extremity: I82.622

## 2016-12-16 LAB — POCT INR: INR: 1.3

## 2016-12-16 MED ORDER — TRAMADOL HCL 50 MG PO TABS: 50.0000 mg | ORAL_TABLET | Freq: Four times a day (QID) | ORAL | 0 refills | Status: DC | PRN

## 2016-12-16 MED ORDER — ONDANSETRON 4 MG PO TBDP: 4.0000 mg | ORAL_TABLET | Freq: Three times a day (TID) | ORAL | 0 refills | Status: DC | PRN

## 2016-12-16 NOTE — Patient Instructions (Signed)
Patient instructed to take medications as defined in the Anti-coagulation Track section of this encounter.  Patient instructed to take today's dose.  Patient instructed to take 4 tablets of your 5mg  peach-colored warfarin tablets Monday, Tuesday, Wednesday; on Thursday--take 3 tablets. Continue Lovenox shots as directed by the ED provider you saw on Sunday 18-MAR-18. Return to [facility] on Friday 23-MAR-18 at 9:30AM. Patient verbalized understanding of these instructions.

## 2016-12-16 NOTE — Progress Notes (Signed)
Anti-Coagulation Progress Note  Michele Gillespie is a 41 y.o. female who is currently on an anti-coagulation regimen.    RECENT RESULTS: Recent results are below, the most recent result is correlated with a dose of 112.5 mg. per week: Lab Results  Component Value Date   INR 1.30 12/16/2016   INR 1.26 12/15/2016   INR 2.80 10/21/2016    ANTI-COAG DOSE: Anticoagulation Dose Instructions as of 12/16/2016      [name] Tue Wed Thu Fri Sat   New Dose 15 mg 20 mg 20 mg 20 mg 15 mg 15 mg 15 mg    Description   Take 4 tablets of your 5mg  peach-colored warfarin tablets Monday, Tuesday, Wednesday; on Thursday--take 3 tablets. Continue Lovenox shots as directed by the ED provider you saw on Sunday 18-MAR-18. Return to [facility] on Friday 23-MAR-18 at 9:30AM.      ANTICOAG SUMMARY: Anticoagulation Episode Summary    Current INR goal:   2.0-3.0  TTR:   47.7 % (4.5 y)  Next INR check:   12/20/2016  INR from last check:   1.30! (12/16/2016)  Weekly max dose:     Target end date:   Indefinite  INR check location:   Coumadin Clinic  Preferred lab:     Send INR reminders to:      Indications   HYPERCOAGULABLE STATE PRIMARY [D68.59] Long term current use of anticoagulant [Z79.01]       Comments:           ANTICOAG TODAY: Anticoagulation Summary  As of 12/16/2016   INR goal:   2.0-3.0  TTR:     Today's INR:   1.30!  Next INR check:   12/20/2016  Target end date:   Indefinite   Indications   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  Patient instructed to take today's dose.  Patient instructed to take 4  tablets of your 5mg  peach-colored warfarin tablets Monday, Tuesday, Wednesday; on Thursday--take 3 tablets. Continue Lovenox shots as directed by the ED provider you saw on Sunday 18-MAR-18. Return to [facility] on Friday 23-MAR-18 at 9:30AM. Patient verbalized understanding of these instructions.       FOLLOW-UP Return in 4 days (on 12/20/2016) for Follow up INR at 0930h.  [name], III Pharm.D., CACP

## 2016-12-16 NOTE — Patient Instructions (Signed)
You can take tramadol for pain and zofran for nausea.

## 2016-12-16 NOTE — Assessment & Plan Note (Signed)
A: Pt seen in the ED yesterday 3/18 for LUE pain found to have an acute left axillary and brachial vein DVT. She had been non compliant with coumadin 3 days prior this b/c she was busy. CTA chest in the ED neg. Her INR yesterday was 1.26 w/ goal of 2-3.0. She is having pain in her LUE which is causing her to feel nauseous, she states she has been vomiting since ED presentation due to pain but is able to keep some po fluids down. She has been taking tylenol and drinking ginger ale for pain. She has lovenox x 10 days and coumadin medications and denies any difficulty in getting more of these meds. She saw Dr. Elie Confer today for her coumadin dosing and has a PCP appt 3/22  P: Given rx for tramadol 50mg  q6h prn and zofran for nausea. F/u on 3/22 w/ PCP.

## 2016-12-16 NOTE — Progress Notes (Signed)
   CC: Left upper extremity DVT follow-up  HPI:  [name] is a 41 y.o. with past medical history as outlined below who presents to clinic for follow-up of acute DVT in left upper extremity. Please see problem was further details of patient's chronic medical issues.  Past Medical History:  Diagnosis Date  . Anemia 2007    microcytic anemia, baseline hemoglobin 10-11, MCV at baseline 72-77, secondary to iron deficiency  . Arm DVT (deep venous thromboembolism), acute [facility])  September 23, 2009, January 05, 2010    Doppler study significant with indeterminant age DVT involving the left upper extremity, Doppler performed January 05, 2010 consistent with acute DVT involving the left upper extremity  . Asthma   . Depression   . Leukopenia 2008    unclear etiology baseline WBC  2.8-3.7  . Lung nodule  June 10, 2008    stable tiny noduke noted along the minor fissure of the right lung on CT angio September 11, 09 -  stable for 2 years and consistent with benign disease  . Pulmonary embolism [facility])  September 07, 2005    her CT angiogram - positive for pulmonary emboli to several branches of the right lower lobe- relatively small clot burden, clear lung; patient started on Coumadin; CT angiogram on January 10, 2006 showed resolution of previously seen pulmonary emboli with minimal basilar atelectasis  . Schizophrenia ([place])     Review of Systems:  Denies numbness or weakness in her left upper extremity. Having left upper extremity pain that she rates as 8 out of 10 in her hands in 4-5 out of 10 in her arm. Pain has not worsened compared to yesterday. Denies any fevers or worsening shortness of breath. Having nausea and vomiting due to pain.  Physical Exam:  Vitals:   12/16/16 0946  BP: (!) 141/98  Pulse: 78  Temp: 98 F (36.7 C)  TempSrc: Oral   Physical Exam  Constitutional:  appears well-developed and well-nourished. Marland Kitchen  HENT:  Head: Normocephalic and atraumatic.  Nose: Nose normal.    Cardiovascular: Normal rate, regular rhythm and normal heart sounds.  Exam reveals no gallop and no friction rub.   No murmur heard. Pulmonary/Chest: Effort normal and breath sounds normal. No respiratory distress.  has no wheezes.no rales.  Abdominal: Soft. Bowel sounds are normal.  exhibits no distension. There is no tenderness. There is no rebound and no guarding.  Neurological: alert and oriented to person, place, and time.  Skin: Skin is warm and dry. No rash noted.  not diaphoretic. No erythema. No pallor.  Extremities: Decreased hand grip on the left due to pain, intact sensation in all left hand digits, 2+ radial pulse in the left wrist, no erythema or rashes on left upper extremity.  Assessment & Plan:   See Encounters Tab for problem based charting.  Patient discussed with Dr. Lynnae January

## 2016-12-16 NOTE — Progress Notes (Signed)
Internal Medicine Clinic Attending  Case discussed with Dr. Truong at the time of the visit.  We reviewed the resident's history and exam and pertinent patient test results.  I agree with the assessment, diagnosis, and plan of care documented in the resident's note.  

## 2016-12-18 ENCOUNTER — Telehealth: Payer: Self-pay | Admitting: Internal Medicine

## 2016-12-18 NOTE — Telephone Encounter (Signed)
APT. REMINDER CALL, PHONE NOT IN SERVICE °

## 2016-12-19 ENCOUNTER — Encounter: Payer: Medicaid Other | Admitting: Internal Medicine

## 2016-12-20 ENCOUNTER — Ambulatory Visit (INDEPENDENT_AMBULATORY_CARE_PROVIDER_SITE_OTHER): Payer: Medicaid Other | Admitting: Pharmacist

## 2016-12-20 DIAGNOSIS — Z7901 Long term (current) use of anticoagulants: Secondary | ICD-10-CM

## 2016-12-20 DIAGNOSIS — D6859 Other primary thrombophilia: Secondary | ICD-10-CM | POA: Diagnosis not present

## 2016-12-20 LAB — POCT INR: INR: 1.7

## 2016-12-20 NOTE — Progress Notes (Signed)
Reviewed thx DrG 

## 2016-12-20 NOTE — Progress Notes (Signed)
Anti-Coagulation Progress Note  Michele Gillespie is a 41 y.o. female who is currently on an anti-coagulation regimen.    RECENT RESULTS: Recent results are below, the most recent result is correlated with a dose of 120 mg. per week + LMWH "bridge" therapy--that will CONTINUE today until Monday December 23, 2016. Patient has LMWH syringes at home she states.  Lab Results  Component Value Date   INR 1.70 12/20/2016   INR 1.30 12/16/2016   INR 1.26 12/15/2016    ANTI-COAG DOSE: Anticoagulation Dose Instructions as of 12/20/2016      [name] Tue Wed Thu Fri Sat   New Dose 20 mg 0 mg 0 mg 0 mg 0 mg 20 mg 20 mg    Description   Take 4 tablets of your 5mg  peach-colored warfarin tablets Friday March 23, Saturday March 24 and Sunday December 22, 2016. Return to clinic on Monday March 26 for repeat fingerstick point of care INR test. CONTINUE injecting yourself with Lovenox injections--ONE INJECTION EVERY 24 hours as previously prescribed by original provider.       ANTICOAG SUMMARY: Anticoagulation Episode Summary    Current INR goal:   2.0-3.0  TTR:   47.5 % (4.5 y)  Next INR check:   12/23/2016  INR from last check:   1.70! (12/20/2016)  Weekly max dose:     Target end date:   Indefinite  INR check location:   C[facility]madin Clinic  Preferred lab:     Send INR reminders to:      Indications   HYPERCOAGULABLE STATE PRIMARY [D68.59] Long term current use of anticoagulant [Z79.01]       Comments:           ANTICOAG TODAY: Anticoagulation Summary  As of 12/20/2016   INR goal:   2.0-3.0  TTR:     Today's INR:   1.70!  Next INR check:   12/23/2016  Target end date:   Indefinite   Indications   HYPERCOAGULABLE STATE PRIMARY [D68.59] Long term current use of anticoagulant [Z79.01]        Anticoagulation Episode Summary    INR check location:   C[facility]madin Clinic   Preferred lab:      Send INR reminders to:      Comments:         PATIENT INSTRUCTIONS: Patient Instructions  Patient  instructed to take medications as defined in the Anti-coagulation Track section of this encounter.  Patient instructed to take today's dose.  Patient instructed to take 4 tablets of your 5mg  peach-colored warfarin tablets on Friday March 23, Saturday March 24 and Sunday December 22, 2016. Patient instructed to CONTINUE to self-inject using one syringe of low-molecular-weight-heparin (enoxaparin/Lovenox) which you have--inject once-daily, 2 inches off of/away from the "belly-button", rotating injection sites (left-right-left, etc.) Patient verbalized understanding of these instructions.      FOLLOW-UP Return in 3 days (on 12/23/2016) for Follow up INR at 0915h.  [name], III Pharm.D., CACP

## 2016-12-20 NOTE — Patient Instructions (Signed)
Patient instructed to take medications as defined in the Anti-coagulation Track section of this encounter.  Patient instructed to take today's dose.  Patient instructed to take 4 tablets of your 5mg  peach-colored warfarin tablets on Friday March 23, Saturday March 24 and Sunday December 22, 2016. Patient instructed to CONTINUE to self-inject using one syringe of low-molecular-weight-heparin (enoxaparin/Lovenox) which you have--inject once-daily, 2 inches off of/away from the "belly-button", rotating injection sites (left-right-left, etc.) Patient verbalized understanding of these instructions.

## 2016-12-23 ENCOUNTER — Ambulatory Visit (INDEPENDENT_AMBULATORY_CARE_PROVIDER_SITE_OTHER): Payer: Medicaid Other | Admitting: Pharmacist

## 2016-12-23 DIAGNOSIS — D6859 Other primary thrombophilia: Secondary | ICD-10-CM | POA: Diagnosis not present

## 2016-12-23 DIAGNOSIS — Z7901 Long term (current) use of anticoagulants: Secondary | ICD-10-CM | POA: Diagnosis present

## 2016-12-23 LAB — POCT INR: INR: 2.4

## 2016-12-23 NOTE — Patient Instructions (Signed)
Patient instructed to take medications as defined in the Anti-coagulation Track section of this encounter.  Patient instructed to take today's dose.  Patient instructed to take 3 of your 5mg  peach-colored warfarin tablets by mouth, once-daily at 6PM all days of week--EXCEPT on Tuesdays and Thursdays--take 4 tablets on Tuesdays and Thursdays.  Patient verbalized understanding of these instructions.

## 2016-12-23 NOTE — Progress Notes (Signed)
Anti-Coagulation Progress Note  Michele Gillespie is a 41 y.o. female who is currently on an anti-coagulation regimen.    RECENT RESULTS: Recent results are below, the most recent result is correlated with a dose of 20mg  for past 3 consecutive days.  Lab Results  Component Value Date   INR 2.40 12/23/2016   INR 1.70 12/20/2016   INR 1.30 12/16/2016    ANTI-COAG DOSE: Anticoagulation Dose Instructions as of 12/23/2016      [name] Tue Wed Thu Fri Sat   New Dose 20 mg 0 mg 0 mg 0 mg 0 mg 20 mg 20 mg    Description   Take 3 of your 5mg  peach-colored warfarin tablets by mouth, once-daily at 6PM all days of week--EXCEPT on Tuesdays and Thursdays--take 4 tablets on Tuesdays and Thursdays.       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] Long term current use of anticoagulant [Z79.01]       Comments:           ANTICOAG TODAY: Anticoagulation Summary  As of 12/23/2016   INR goal:   2.0-3.0  TTR:     Today's INR:   2.40  Next INR check:   01/06/2017  Target end date:   Indefinite   Indications   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  Patient instructed to take today's dose.  Patient instructed to take 3 of your 5mg  peach-colored warfarin tablets by mouth, once-daily at 6PM all days of week--EXCEPT on  Tuesdays and Thursdays--take 4 tablets on Tuesdays and Thursdays.  Patient verbalized understanding of these instructions.       FOLLOW-UP Return in 2 weeks (on 01/06/2017) for Follow up INR at 0930h.  [name], III Pharm.D., CACP

## 2016-12-23 NOTE — Progress Notes (Signed)
INTERNAL MEDICINE TEACHING ATTENDING ADDENDUM - Lucious Groves, DO Duration- indefinate, Indication- hypercoagulable state, INR-  Lab Results  Component Value Date   INR 2.40 12/23/2016  . Agree with pharmacy recommendations as outlined in their note.

## 2017-01-06 ENCOUNTER — Ambulatory Visit (INDEPENDENT_AMBULATORY_CARE_PROVIDER_SITE_OTHER): Payer: Medicaid Other | Admitting: Pharmacist

## 2017-01-06 DIAGNOSIS — D6859 Other primary thrombophilia: Secondary | ICD-10-CM

## 2017-01-06 DIAGNOSIS — Z7901 Long term (current) use of anticoagulants: Secondary | ICD-10-CM | POA: Diagnosis not present

## 2017-01-06 LAB — POCT INR: INR: 3.6

## 2017-01-06 NOTE — Progress Notes (Signed)
Indication: Recurrent venous thromboembolism. Duration: Indefinite. INR: Above target. Agree with Dr. Gladstone Pih assessment and plan.

## 2017-01-06 NOTE — Patient Instructions (Signed)
Patient instructed to take medications as defined in the Anti-coagulation Track section of this encounter.  Patient instructed to take today's dose.  Patient instructed to take 3 of your peach-colored 5mg  strength warfarin tablets by mouth, once-daily, at [facility] each day.  Patient verbalized understanding of these instructions.

## 2017-01-06 NOTE — Progress Notes (Signed)
Anti-Coagulation Progress Note  Michele Gillespie is a 41 y.o. female who is currently on an anti-coagulation regimen.    RECENT RESULTS: Recent results are below, the most recent result is correlated with a dose of 115 mg. per week: Lab Results  Component Value Date   INR 3.60 01/06/2017   INR 2.40 12/23/2016   INR 1.70 12/20/2016    ANTI-COAG DOSE: Anticoagulation Dose Instructions as of 01/06/2017      [name] Tue Wed Thu Fri Sat   New Dose 20 mg 0 mg 0 mg 0 mg 0 mg 20 mg 20 mg    Description   Take 3 of your 5mg  peach-colored warfarin tablets by mouth, once-daily at 6PM all days of week.      ANTICOAG SUMMARY: Anticoagulation Episode Summary    Current INR goal:   2.0-3.0  TTR:   47.6 % (4.6 y)  Next INR check:   01/06/2017  INR from last check:   3.60! 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  Patient instructed to take today's dose.  Patient instructed to take 3 of your peach-colored 5mg  strength warfarin tablets by mouth, once-daily, at [facility] each day.  Patient verbalized understanding of these instructions.       FOLLOW-UP Return in 3 weeks (on 01/27/2017)  for Follow up INR at 1130h.  [name], III Pharm.D., CACP

## 2017-01-22 ENCOUNTER — Emergency Department (HOSPITAL_COMMUNITY): Payer: Medicaid Other

## 2017-01-22 ENCOUNTER — Emergency Department (HOSPITAL_COMMUNITY)
Admission: EM | Admit: 2017-01-22 | Discharge: 2017-01-22 | Disposition: A | Discharge status: Home / Self Care | Payer: Medicaid Other | Attending: Emergency Medicine | Admitting: Emergency Medicine

## 2017-01-22 ENCOUNTER — Encounter (HOSPITAL_COMMUNITY): Payer: Self-pay | Admitting: Emergency Medicine

## 2017-01-22 DIAGNOSIS — Y939 Activity, unspecified: Secondary | ICD-10-CM | POA: Diagnosis not present

## 2017-01-22 DIAGNOSIS — J45909 Unspecified asthma, uncomplicated: Secondary | ICD-10-CM | POA: Diagnosis not present

## 2017-01-22 DIAGNOSIS — F17210 Nicotine dependence, cigarettes, uncomplicated: Secondary | ICD-10-CM | POA: Insufficient documentation

## 2017-01-22 DIAGNOSIS — M25521 Pain in right elbow: Secondary | ICD-10-CM | POA: Diagnosis not present

## 2017-01-22 DIAGNOSIS — Y929 Unspecified place or not applicable: Secondary | ICD-10-CM | POA: Insufficient documentation

## 2017-01-22 DIAGNOSIS — Y999 Unspecified external cause status: Secondary | ICD-10-CM | POA: Insufficient documentation

## 2017-01-22 DIAGNOSIS — S59901A Unspecified injury of right elbow, initial encounter: Secondary | ICD-10-CM | POA: Diagnosis present

## 2017-01-22 DIAGNOSIS — Z7901 Long term (current) use of anticoagulants: Secondary | ICD-10-CM | POA: Diagnosis not present

## 2017-01-22 DIAGNOSIS — W19XXXA Unspecified fall, initial encounter: Secondary | ICD-10-CM | POA: Diagnosis not present

## 2017-01-22 NOTE — ED Provider Notes (Signed)
[place] DEPT Provider Note   CSN: [id number] Arrival date & time: 01/22/17  1657   By signing my name below, I, Delton Prairie, attest that this documentation has been prepared under the direction and in the presence of  Debroah Baller, NP. Electronically Signed: Delton Prairie, ED Scribe. 01/22/17. 6:27 PM.   History   Chief Complaint Chief Complaint  Patient presents with  . Arm Injury    HPI Comments:  Michele Gillespie is a 41 y.o. female, with a PMHx of DVT in LUE, who presents to the Emergency Department complaining of gradually worsening right elbow pain s/p a fall which occurred about 3.5 weeks ago. She notes her pain radiates up her right upper extremity. Her pain is worse with movement of her right upper extremity and when twisting her right arm. She has taken Tylenol with no relief. Pt denies any other associated symptoms. No other complaints noted at this time.    The history is provided by the patient. No language interpreter was used.  Arm Injury   This is a new problem. The current episode started more than 1 week ago. The problem occurs constantly. The problem has been gradually worsening. The pain is present in the right elbow. The pain is moderate. Pertinent negatives include no numbness. She has tried nothing for the symptoms. The treatment provided no relief.    Past Medical History:  Diagnosis Date  . Anemia 2007    microcytic anemia, baseline hemoglobin 10-11, MCV at baseline 72-77, secondary to iron deficiency  . Arm DVT (deep venous thromboembolism), acute [facility])  September 23, 2009, January 05, 2010    Doppler study significant with indeterminant age DVT involving the left upper extremity, Doppler performed January 05, 2010 consistent with acute DVT involving the left upper extremity  . Asthma   . Depression   . Leukopenia 2008    unclear etiology baseline WBC  2.8-3.7  . Lung nodule  June 10, 2008    stable tiny noduke noted along the minor fissure of the right  lung on CT angio September 11, 09 -  stable for 2 years and consistent with benign disease  . Pulmonary embolism [facility])  September 07, 2005    her CT angiogram - positive for pulmonary emboli to several branches of the right lower lobe- relatively small clot burden, clear lung; patient started on Coumadin; CT angiogram on January 10, 2006 showed resolution of previously seen pulmonary emboli with minimal basilar atelectasis  . Schizophrenia [facility])     Patient Active Problem List   Diagnosis Date Noted  . Acute deep vein thrombosis (DVT) of brachial vein of left upper extremity ([place]) 12/16/2016  . Right hip pain 09/09/2016  . Depression 06/20/2016  . Preventative health care 06/20/2016  . Pelvic mass in female 06/20/2016  . Chest pain 02/15/2016  . Secondary amenorrhea 09/14/2014  . Allergic rhinitis with Asthma. 09/14/2014  . GERD (gastroesophageal reflux disease) 09/14/2013  . Breast mass, right 09/14/2013  . Healthcare maintenance 09/14/2013  . Cervical mass 02/08/2013  . H/O cesarean section 12/21/2012  . Tobacco use disorder 06/22/2012  . Long term current use of anticoagulant 10/20/2010  . DEPRESSION, HX OF 05/16/2010  . Iron deficiency anemia 10/12/2006  . LEUKOCYTOPENIA NOS 10/12/2006  . HYPERCOAGULABLE STATE, PRIMARY 10/12/2006    Past Surgical History:  Procedure Laterality Date  . CESAREAN SECTION      History of 5 C-section  . EXPLORATORY LAPAROTOMY WITH ABDOMINAL MASS EXCISION  02/2005  . TUBAL  LIGATION      OB History    Gravida Para Term Preterm AB Living   5 4 4  0 0 5   SAB TAB Ectopic Multiple Live Births   0 0 0 0         Home Medications    Prior to Admission medications   Medication Sig Start Date End Date Taking? Authorizing Provider  albuterol (PROVENTIL HFA) 108 (90 Base) MCG/ACT inhaler INHALE 2 PUFFS INTO THE LUNGS EVERY 6 HOURS AS NEEDED FOR WHEEZING. FOR SHORTNESS OF BREATH Patient taking differently: Inhale 2 puffs into the lungs every 6 (six)  hours as needed for shortness of breath.  12/11/16   Ophelia Shoulder, MD  ARIPiprazole (ABILIFY) 10 MG tablet Take 1 tablet (10 mg total) by mouth daily. 09/09/16   Norman Herrlich, MD  nicotine polacrilex (NICORETTE) 2 MG gum RX #3 Weeks 10-12: 1 piece every 4-8 hours. Max 24 pieces per day. 10/20/15   Ejiroghene Arlyce Dice, MD  olopatadine (PATANOL) 0.1 % ophthalmic solution Place 1 drop into both eyes 2 (two) times daily. 08/20/16 08/20/17  Ophelia Shoulder, MD  omeprazole (PRILOSEC) 20 MG capsule Take 1 capsule (20 mg total) by mouth daily. 11/06/16   Ophelia Shoulder, MD  ondansetron (ZOFRAN-ODT) 4 MG disintegrating tablet Take 1 tablet (4 mg total) by mouth every 8 (eight) hours as needed for nausea or vomiting. 12/16/16   Norman Herrlich, MD  traMADol (ULTRAM) 50 MG tablet Take 1 tablet (50 mg total) by mouth every 6 (six) hours as needed. 12/16/16 12/16/17  Norman Herrlich, MD  warfarin (COUMADIN) 5 MG tablet Take 4 tablets (20mg ) on Wednesdays and Fridays; take 3 tablets (15mg ) all other days. Patient taking differently: Take 15-17.5 mg by mouth daily at 6 PM. Take 3 1/2 tablets (17.5mg ) on Wednesdays, and 3 tablets (15mg ) all other days. 05/28/16   Oval Linsey, MD    Family History Family History  Problem Relation Age of Onset  . Hypertension Mother   . Congestive Heart Failure Mother   . Diabetes Father   . Birth defects Maternal Aunt   . Birth defects Maternal Uncle   . Diabetes Paternal Grandmother   . Breast cancer Sister   . Breast cancer Sister     Social History Social History  Substance Use Topics  . Smoking status: Current Every Day Smoker    Packs/day: 0.25    Years: 0.00    Types: Cigarettes  . Smokeless tobacco: Never Used     Comment: about 1/2 pack a day Increased   . Alcohol use 0.0 oz/week     Allergies   Patient has no known allergies.   Review of Systems Review of Systems  Constitutional: Negative for fever.  Gastrointestinal: Negative for nausea and vomiting.    Musculoskeletal: Positive for arthralgias.       Right elbow pain  Skin: Negative for wound.  Neurological: Negative for numbness.     Physical Exam Updated Vital Signs BP (!) 156/106 (BP Location: Left Arm)   Pulse 73   Temp 98.4 F (36.9 C) (Oral)   Resp 16   Ht 5\' 4"  (1.626 m)   Wt 99.8 kg   LMP 12/13/2016 (Approximate)   SpO2 99%   BMI 37.76 kg/m   Physical Exam  Constitutional: She is oriented to person, place, and time. She appears well-developed and well-nourished. No distress.  HENT:  Head: Normocephalic and atraumatic.  Eyes: Conjunctivae are normal.  Cardiovascular: Normal rate.  Pulses:      Radial pulses are 2+ on the right side, and 2+ on the left side.  Pulmonary/Chest: Effort normal.  Abdominal: She exhibits no distension.  Musculoskeletal: Normal range of motion. She exhibits no edema.  FROM of right wrist. Pain to posterior aspect of right elbow. Full flexion and extension of the elbow without difficulty. No welling or effusion noted.   Neurological: She is alert and oriented to person, place, and time.  Grips equal  Skin: Skin is warm and dry.  Psychiatric: She has a normal mood and affect.  Nursing note and vitals reviewed.    ED Treatments / Results  DIAGNOSTIC STUDIES:  Oxygen Saturation is 97% on RA, normal by my interpretation.    COORDINATION OF CARE:  6:27 PM Discussed treatment plan with pt at bedside and pt agreed to plan.  Labs (all labs ordered are listed, but only abnormal results are displayed) Labs Reviewed - No data to display Radiology No results found.  Procedures Procedures (including critical care time)  Medications Ordered in ED Medications - No data to display   Initial Impression / Assessment and Plan / ED Course  I have reviewed the triage vital signs and the nursing notes. Patient X-Ray negative for obvious fracture or dislocation.  Pt advised to follow up with orthopedics. Patient given ACE wrap while in  ED, conservative therapy recommended and discussed. Patient will be discharged home & is agreeable with above plan. Returns precautions discussed. Pt appears safe for discharge.  Final Clinical Impressions(s) / ED Diagnoses   Final diagnoses:  Elbow pain, right  Right elbow pain    New Prescriptions Discharge Medication List as of 01/22/2017  6:31 PM    I personally performed the services described in this documentation, which was scribed in my presence. The recorded information has been reviewed and is accurate.     [address] [place], [place] 01/24/17 2121    Gareth Morgan, MD 01/31/17 [phone]

## 2017-01-22 NOTE — Discharge Instructions (Signed)
Take your Tramadol as needed. Follow up with Dr. Percell Miller if symptoms do not improve.

## 2017-01-22 NOTE — ED Triage Notes (Signed)
Pt st's she fell approx 3 1/2 weeks ago and has been having pain in right elbow since the fall.  St's she is unable to sleep at night due to pain

## 2017-01-24 ENCOUNTER — Encounter: Payer: Self-pay | Admitting: Cardiovascular Disease

## 2017-01-24 ENCOUNTER — Ambulatory Visit (INDEPENDENT_AMBULATORY_CARE_PROVIDER_SITE_OTHER): Payer: Medicaid Other | Admitting: Cardiovascular Disease

## 2017-01-24 ENCOUNTER — Other Ambulatory Visit (HOSPITAL_COMMUNITY): Payer: Self-pay | Admitting: Orthopedic Surgery

## 2017-01-24 ENCOUNTER — Ambulatory Visit (HOSPITAL_COMMUNITY)
Admission: RE | Admit: 2017-01-24 | Discharge: 2017-01-24 | Disposition: A | Discharge status: Home / Self Care | Payer: Medicaid Other | Source: Ambulatory Visit | Attending: Cardiovascular Disease | Admitting: Cardiovascular Disease

## 2017-01-24 VITALS — BP 132/86 | HR 74 | Ht 64.5 in | Wt 217.4 lb

## 2017-01-24 DIAGNOSIS — Z720 Tobacco use: Secondary | ICD-10-CM | POA: Diagnosis not present

## 2017-01-24 DIAGNOSIS — M7989 Other specified soft tissue disorders: Secondary | ICD-10-CM

## 2017-01-24 DIAGNOSIS — I82611 Acute embolism and thrombosis of superficial veins of right upper extremity: Secondary | ICD-10-CM

## 2017-01-24 DIAGNOSIS — M79601 Pain in right arm: Secondary | ICD-10-CM | POA: Insufficient documentation

## 2017-01-24 DIAGNOSIS — I82722 Chronic embolism and thrombosis of deep veins of left upper extremity: Secondary | ICD-10-CM | POA: Diagnosis not present

## 2017-01-24 NOTE — Patient Instructions (Signed)
Medication Instructions:  Your physician recommends that you continue on your current medications as directed. Please refer to the Current Medication list given to you today.  Labwork: none  Testing/Procedures: none  Follow-Up: Keep follow up with your Primary Care Monday   Follow up with Dr Oval Linsey as needed

## 2017-01-24 NOTE — Progress Notes (Signed)
Cardiology Office Note   Date:  01/24/2017   ID:  Michele Gillespie, DOB 23-Jul-1976, MRN [id number]  PCP:  Ophelia Shoulder, MD  Cardiologist:   Skeet Latch, MD   Chief Complaint  Patient presents with  . New Evaluation    positive doppler, pt denied chest pain, pt c/o SOB     History of Present Illness: Michele Gillespie is a 41 y.o. female with Schizophrenia, a prior left upper extremity DVT and PE, asthma who presents for management of DVT. She was seen in the ED /25/18 with right upper extremity pain after a recent fall. X-ray was negative for fracture or dislocation.  She was noted To have a left upper extremity acute DVT. She was artery taking warfarin but her INR was subtherapeutic at 1.3. She was started on Lovenox as a bridge until her INR was therapeutic and this was discontinued 12/23/16. She has an extensive history of upper extremity DVTs and pulmonary embolism. She is on chronic anticoagulation. She reports having been seen by a hematologist in 2017 but these records are not available. She reports having multiple lab draws and was told that she had to be on anticoagulation forever did not have any additional diagnosis. She saw her orthopedist, Dr. Noemi Chapel and reported right upper extremity pain and swelling. 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  [name] has been otherwise feeling well. She denies chest pain or shortness of breath. She has mild lower extremity edema that improves with elevation of her legs and is chronic. She denies orthopnea or PND. She denies any numbness or tingling in her hands or arms. She continues to smoke 8-10 cigarettes daily. She is using patches and gum to try and quit.  Past Medical History:  Diagnosis Date  . Anemia 2007    microcytic anemia, baseline hemoglobin 10-11, MCV at baseline 72-77, secondary to iron deficiency  . Arm DVT (deep venous thromboembolism), acute  [facility])  September 23, 2009, January 05, 2010    Doppler study significant with indeterminant age DVT involving the left upper extremity, Doppler performed January 05, 2010 consistent with acute DVT involving the left upper extremity  . Asthma   . Depression   . Leukopenia 2008    unclear etiology baseline WBC  2.8-3.7  . Lung nodule  June 10, 2008    stable tiny noduke noted along the minor fissure of the right lung on CT angio September 11, 09 -  stable for 2 years and consistent with benign disease  . Pulmonary embolism [facility])  September 07, 2005    her CT angiogram - positive for pulmonary emboli to several branches of the right lower lobe- relatively small clot burden, clear lung; patient started on Coumadin; CT angiogram on January 10, 2006 showed resolution of previously seen pulmonary emboli with minimal basilar atelectasis  . Schizophrenia [facility])     Past Surgical History:  Procedure Laterality Date  . CESAREAN SECTION      History of 5 C-section  . EXPLORATORY LAPAROTOMY WITH ABDOMINAL MASS EXCISION  02/2005  . TUBAL LIGATION       Current Outpatient Prescriptions  Medication Sig Dispense Refill  . albuterol (PROVENTIL HFA) 108 (90 Base) MCG/ACT inhaler INHALE 2 PUFFS INTO THE LUNGS EVERY 6 HOURS AS NEEDED FOR WHEEZING. FOR SHORTNESS OF BREATH (Patient taking differently: Inhale 2 puffs into the lungs every 6 (six) hours as needed for shortness  of breath. ) 6.7 Inhaler 0  . ARIPiprazole (ABILIFY) 10 MG tablet Take 1 tablet (10 mg total) by mouth daily. 30 tablet 0  . nicotine polacrilex (NICORETTE) 2 MG gum RX #3 Weeks 10-12: 1 piece every 4-8 hours. Max 24 pieces per day. 126 each 0  . olopatadine (PATANOL) 0.1 % ophthalmic solution Place 1 drop into both eyes 2 (two) times daily. 5 mL 1  . omeprazole (PRILOSEC) 20 MG capsule Take 1 capsule (20 mg total) by mouth daily. 30 capsule 3  . ondansetron (ZOFRAN-ODT) 4 MG disintegrating tablet Take 1 tablet (4 mg total) by mouth every 8  (eight) hours as needed for nausea or vomiting. 20 tablet 0  . traMADol (ULTRAM) 50 MG tablet Take 1 tablet (50 mg total) by mouth every 6 (six) hours as needed. 20 tablet 0  . warfarin (COUMADIN) 5 MG tablet Take 4 tablets (20mg ) on Wednesdays and Fridays; take 3 tablets (15mg ) all other days. (Patient taking differently: Take 15-17.5 mg by mouth daily at 6 PM. Take 3 1/2 tablets (17.5mg ) on Wednesdays, and 3 tablets (15mg ) all other days.) 92 tablet 2   No current facility-administered medications for this visit.     Allergies:   Patient has no known allergies.    Social History:  The patient  reports that she has been smoking Cigarettes.  She has been smoking about 0.25 packs per day for the past 0.00 years. She has never used smokeless tobacco. She reports that she drinks alcohol. She reports that she uses drugs, including Marijuana.   Family History:  The patient's family history includes Birth defects in her maternal aunt and maternal uncle; Breast cancer in her sister and sister; Congestive Heart Failure in her mother; Diabetes in her father and paternal grandmother; Hypertension in her mother.    ROS:  Please see the history of present illness.   Otherwise, review of systems are positive for none.   All other systems are reviewed and negative.    PHYSICAL EXAM: VS:  BP 132/86   Pulse 74   Ht 5' 4.5" (1.638 m)   Wt 98.6 kg (217 lb 6.4 oz)   BMI 36.74 kg/m  , BMI Body mass index is 36.74 kg/m. GENERAL:  Well appearing HEENT:  Pupils equal round and reactive, fundi not visualized, oral mucosa unremarkable NECK:  No jugular venous distention, waveform within normal limits, carotid upstroke brisk and symmetric, no bruits, no thyromegaly LYMPHATICS:  No cervical adenopathy LUNGS:  Clear to auscultation bilaterally HEART:  RRR.  PMI not displaced or sustained,S1 and S2 within normal limits, no S3, no S4, no clicks, no rubs, no murmurs ABD:  Flat, positive bowel sounds normal in  frequency in pitch, no bruits, no rebound, no guarding, no midline pulsatile mass, no hepatomegaly, no splenomegaly EXT:  2 plus pulses throughout, 2+ R upper extremity edema, no cyanosis no clubbing SKIN:  No rashes no nodules NEURO:  Cranial nerves II through XII grossly intact, motor grossly intact throughout PSYCH:  Cognitively intact, oriented to person place and time   EKG:  EKG is not ordered today.   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 She also has an acute thrombus on the left that is being treated appropriately. We discussed the fact that her DVTs have recurred multiple times and it might be time for her to try an alternative medication instead of her friend. She is very concerned about trying a medicine that does not require monitoring. She doesn't understand how she will know if her medication is therapeutic. After extensive education she is still reluctant to make this change. I suggest that she talk about this with her primary care provider. I also recommended that she have a repeat evaluation by hematologist, as she last saw one 10 years ago.  Continue warfarin for now.  INR therapeutic 01/06/17.  This thrombus is chronic, non-occlusive and not a risk for developing PE.  # Tobacco abuse: Encouraged smoking cessation.  This increases her likelihood of developing  venous thrombosis.  Discussed smoking cessation for 5 minutes.   Current medicines are reviewed at length with the patient today.  The patient does not have concerns regarding medicines.  The following changes have been made:  no change  Labs/ tests ordered today include:  No orders of the defined types were placed in this encounter.    Disposition:   FU with Kehlani Vancamp C. Oval Linsey, MD, [facility] as needed.    This note was written with the assistance of speech recognition software.  Please excuse any transcriptional errors.  Signed, Cherri Yera C. Oval Linsey, MD, [facility]  01/24/2017 5:46 PM    [facility]

## 2017-01-25 ENCOUNTER — Encounter (HOSPITAL_COMMUNITY): Payer: Self-pay | Admitting: Emergency Medicine

## 2017-01-25 ENCOUNTER — Emergency Department (HOSPITAL_COMMUNITY)
Admission: EM | Admit: 2017-01-25 | Discharge: 2017-01-25 | Disposition: A | Discharge status: Home / Self Care | Payer: Medicaid Other | Attending: Emergency Medicine | Admitting: Emergency Medicine

## 2017-01-25 DIAGNOSIS — M79601 Pain in right arm: Secondary | ICD-10-CM | POA: Diagnosis present

## 2017-01-25 DIAGNOSIS — I82621 Acute embolism and thrombosis of deep veins of right upper extremity: Secondary | ICD-10-CM | POA: Insufficient documentation

## 2017-01-25 DIAGNOSIS — Z79899 Other long term (current) drug therapy: Secondary | ICD-10-CM | POA: Diagnosis not present

## 2017-01-25 DIAGNOSIS — Z7901 Long term (current) use of anticoagulants: Secondary | ICD-10-CM | POA: Insufficient documentation

## 2017-01-25 DIAGNOSIS — F17210 Nicotine dependence, cigarettes, uncomplicated: Secondary | ICD-10-CM | POA: Insufficient documentation

## 2017-01-25 DIAGNOSIS — J45909 Unspecified asthma, uncomplicated: Secondary | ICD-10-CM | POA: Diagnosis not present

## 2017-01-25 MED ORDER — HYDROCODONE-ACETAMINOPHEN 5-325 MG PO TABS
1.0000 | ORAL_TABLET | Freq: Once | ORAL | Status: AC
  Administered 2017-01-25: 1 via ORAL
  Filled 2017-01-25: qty 1

## 2017-01-25 MED ORDER — HYDROCODONE-ACETAMINOPHEN 5-325 MG PO TABS: 2.0000 | ORAL_TABLET | ORAL | 0 refills | Status: DC | PRN

## 2017-01-25 NOTE — ED Provider Notes (Signed)
[facility] DEPT Provider Note   CSN: [id number] Arrival date & time: 01/25/17  1503     History   Chief Complaint Chief Complaint  Patient presents with  . Arm Pain    HPI Michele Gillespie is a 41 y.o. female.  The history is provided by the patient.  Extremity Pain  This is a recurrent problem. The current episode started more than 2 days ago. The problem occurs constantly. The problem has not changed since onset.Pertinent negatives include no chest pain, no abdominal pain, no headaches and no shortness of breath. The symptoms are aggravated by bending and twisting. The symptoms are relieved by ice, rest, acetaminophen and relaxation. She has tried acetaminophen, rest, a cold compress and a warm compress for the symptoms. The treatment provided mild relief.    Past Medical History:  Diagnosis Date  . Anemia 2007    microcytic anemia, baseline hemoglobin 10-11, MCV at baseline 72-77, secondary to iron deficiency  . Arm DVT (deep venous thromboembolism), acute [facility])  September 23, 2009, January 05, 2010    Doppler study significant with indeterminant age DVT involving the left upper extremity, Doppler performed January 05, 2010 consistent with acute DVT involving the left upper extremity  . Asthma   . Depression   . Leukopenia 2008    unclear etiology baseline WBC  2.8-3.7  . Lung nodule  June 10, 2008    stable tiny noduke noted along the minor fissure of the right lung on CT angio September 11, 09 -  stable for 2 years and consistent with benign disease  . Pulmonary embolism [facility])  September 07, 2005    her CT angiogram - positive for pulmonary emboli to several branches of the right lower lobe- relatively small clot burden, clear lung; patient started on Coumadin; CT angiogram on January 10, 2006 showed resolution of previously seen pulmonary emboli with minimal basilar atelectasis  . Schizophrenia [facility])     Patient Active Problem List   Diagnosis Date Noted  . Acute deep vein  thrombosis (DVT) of brachial vein of left upper extremity ([place]) 12/16/2016  . Right hip pain 09/09/2016  . Depression 06/20/2016  . Preventative health care 06/20/2016  . Pelvic mass in female 06/20/2016  . Chest pain 02/15/2016  . Secondary amenorrhea 09/14/2014  . Allergic rhinitis with Asthma. 09/14/2014  . GERD (gastroesophageal reflux disease) 09/14/2013  . Breast mass, right 09/14/2013  . Healthcare maintenance 09/14/2013  . Cervical mass 02/08/2013  . H/O cesarean section 12/21/2012  . Tobacco use disorder 06/22/2012  . Long term current use of anticoagulant 10/20/2010  . DEPRESSION, HX OF 05/16/2010  . Iron deficiency anemia 10/12/2006  . LEUKOCYTOPENIA NOS 10/12/2006  . HYPERCOAGULABLE STATE, PRIMARY 10/12/2006    Past Surgical History:  Procedure Laterality Date  . CESAREAN SECTION      History of 5 C-section  . EXPLORATORY LAPAROTOMY WITH ABDOMINAL MASS EXCISION  02/2005  . TUBAL LIGATION      OB History    Gravida Para Term Preterm AB Living   5 4 4  0 0 5   SAB TAB Ectopic Multiple Live Births   0 0 0 0         Home Medications    Prior to Admission medications   Medication Sig Start Date End Date Taking? Authorizing Provider  albuterol (PROVENTIL HFA) 108 (90 Base) MCG/ACT inhaler INHALE 2 PUFFS INTO THE LUNGS EVERY 6 HOURS AS NEEDED FOR WHEEZING. FOR SHORTNESS OF BREATH Patient taking differently: Inhale  2 puffs into the lungs every 6 (six) hours as needed for shortness of breath.  12/11/16   Ophelia Shoulder, MD  ARIPiprazole (ABILIFY) 10 MG tablet Take 1 tablet (10 mg total) by mouth daily. 09/09/16   Norman Herrlich, MD  HYDROcodone-acetaminophen (NORCO/VICODIN) 5-325 MG tablet Take 2 tablets by mouth every 4 (four) hours as needed. 01/25/17   Esaw Grandchild, MD  nicotine polacrilex (NICORETTE) 2 MG gum RX #3 Weeks 10-12: 1 piece every 4-8 hours. Max 24 pieces per day. 10/20/15   Ejiroghene Arlyce Dice, MD  olopatadine (PATANOL) 0.1 % ophthalmic solution Place 1  drop into both eyes 2 (two) times daily. 08/20/16 08/20/17  Ophelia Shoulder, MD  omeprazole (PRILOSEC) 20 MG capsule Take 1 capsule (20 mg total) by mouth daily. 11/06/16   Ophelia Shoulder, MD  ondansetron (ZOFRAN-ODT) 4 MG disintegrating tablet Take 1 tablet (4 mg total) by mouth every 8 (eight) hours as needed for nausea or vomiting. 12/16/16   Norman Herrlich, MD  traMADol (ULTRAM) 50 MG tablet Take 1 tablet (50 mg total) by mouth every 6 (six) hours as needed. 12/16/16 12/16/17  Norman Herrlich, MD  warfarin (COUMADIN) 5 MG tablet Take 4 tablets (20mg ) on Wednesdays and Fridays; take 3 tablets (15mg ) all other days. Patient taking differently: Take 15-17.5 mg by mouth daily at 6 PM. Take 3 1/2 tablets (17.5mg ) on Wednesdays, and 3 tablets (15mg ) all other days. 05/28/16   Oval Linsey, MD    Family History Family History  Problem Relation Age of Onset  . Hypertension Mother   . Congestive Heart Failure Mother   . Diabetes Father   . Birth defects Maternal Aunt   . Birth defects Maternal Uncle   . Diabetes Paternal Grandmother   . Breast cancer Sister   . Breast cancer Sister     Social History Social History  Substance Use Topics  . Smoking status: Current Every Day Smoker    Packs/day: 0.25    Years: 0.00    Types: Cigarettes  . Smokeless tobacco: Never Used     Comment: about 1/2 pack a day Increased   . Alcohol use 0.0 oz/week     Allergies   Patient has no known allergies.   Review of Systems Review of Systems  Constitutional: Negative for chills and fever.  HENT: Negative for ear pain and sore throat.   Eyes: Negative for pain and visual disturbance.  Respiratory: Negative for cough and shortness of breath.   Cardiovascular: Negative for chest pain and palpitations.  Gastrointestinal: Negative for abdominal pain and vomiting.  Genitourinary: Negative for dysuria and hematuria.  Musculoskeletal: Positive for arthralgias and myalgias. Negative for back pain.  Skin: Negative  for color change and rash.  Neurological: Negative for seizures, syncope and headaches.  All other systems reviewed and are negative.    Physical Exam Updated Vital Signs BP (!) 131/102   Pulse 75   Temp 98.4 F (36.9 C) (Oral)   Resp 18   SpO2 99%   Physical Exam  Constitutional: She appears well-developed and well-nourished. No distress.  HENT:  Head: Normocephalic and atraumatic.  Eyes: Conjunctivae are normal.  Neck: Neck supple.  Cardiovascular: Normal rate and regular rhythm.   No murmur heard. Pulses:      Radial pulses are 2+ on the right side, and 2+ on the left side.  Pulmonary/Chest: Effort normal and breath sounds normal. No respiratory distress.  Abdominal: Soft. There is no tenderness.  Musculoskeletal: She exhibits  no edema.       Right elbow: She exhibits normal range of motion, no swelling, no effusion and no deformity. No tenderness found.       Right upper arm: She exhibits tenderness. She exhibits no bony tenderness, no swelling, no edema and no deformity.       Right forearm: She exhibits tenderness. She exhibits no bony tenderness, no swelling, no edema and no deformity.  Neurological: She is alert. No cranial nerve deficit or sensory deficit. GCS eye subscore is 4. GCS verbal subscore is 5. GCS motor subscore is 6.  Skin: Skin is warm and dry. No rash noted.  Psychiatric: She has a normal mood and affect. Her speech is normal.  Nursing note and vitals reviewed.    ED Treatments / Results  Labs (all labs ordered are listed, but only abnormal results are displayed) Labs Reviewed - No data to display  EKG  EKG Interpretation None       Radiology No results found.  Procedures Procedures (including critical care time)  Medications Ordered in ED Medications  HYDROcodone-acetaminophen (NORCO/VICODIN) 5-325 MG per tablet 1 tablet (1 tablet Oral Given 01/25/17 1601)     Initial Impression / Assessment and Plan / ED Course  I have reviewed  the triage vital signs and the nursing notes.  Pertinent labs & imaging results that were available during my care of the patient were reviewed by me and considered in my medical decision making (see chart for details).     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 Patient has been seen several times the last several days by different physicians in the setting of new right-sided arm pain. Patient found to have an acute on chronic DVT. Discussions were had regarding switching from Coumadin to a NOAC and patient hesitant to do this at this time. She continues to be on her Coumadin. Patient denies shortness of breath or chest pain. Patient has follow up PCP in 2 days and has plans to follow up with hematology. She presents today with continued pain in her arm. Patient refused pain medication at previous emergency department visit as she thought ice and over-the-counter medicine would relieve her symptoms. Due to continued pain she presented today.  On arrival patient neurovascular intact and hemodynamically stable. At this time do not believe patient requires labs or imaging and patient has adequate pre-existing follow-up. Patient given pain medication in the emergency Department and short course for home use. Patient to discuss further pain regimen with PCP on Monday. Strict return precautions given and patient stable at time of discharge.  Final Clinical Impressions(s) / ED Diagnoses   Final diagnoses:  Right arm pain  Acute deep vein thrombosis (DVT) of right upper extremity, unspecified vein (HCC)    New Prescriptions New Prescriptions   HYDROCODONE-ACETAMINOPHEN (NORCO/VICODIN) 5-325 MG TABLET    Take 2 tablets by mouth every 4 (four) hours as needed.     Esaw Grandchild, MD 01/25/17 [place], MD 01/26/17 2232

## 2017-01-25 NOTE — ED Triage Notes (Signed)
Pt c/o R arm pain, states she was seen here a couple nights ago for the same thing, "they think I have a contusion and something wrong with my nerves". Pt states Orthopedic sent her to get an ultrasound of her arm and there was a blood clot in her arm.

## 2017-01-25 NOTE — ED Triage Notes (Signed)
Pt saw her PCP yesterday and is already on coumadin, pt is supposed to see her "coumadin doctor" on Monday. Pt was given tramadol for her pain and stated that helped with the pain but she ran out. Pt is very poor historian, initially denied seeing anyone yesterday. Pt angry at questions being asked.

## 2017-01-25 NOTE — ED Notes (Signed)
Pt verbalized understanding of d/c instructions and has no further questions. Pt stable and NAD. VSS. Pt to follow up with her MD on Monday about possibly changing coumadin.

## 2017-01-27 ENCOUNTER — Ambulatory Visit (INDEPENDENT_AMBULATORY_CARE_PROVIDER_SITE_OTHER): Payer: Medicaid Other | Admitting: Pharmacist

## 2017-01-27 DIAGNOSIS — D6859 Other primary thrombophilia: Secondary | ICD-10-CM

## 2017-01-27 DIAGNOSIS — Z7901 Long term (current) use of anticoagulants: Secondary | ICD-10-CM | POA: Diagnosis not present

## 2017-01-27 LAB — POCT INR: INR: 3.3

## 2017-01-27 MED ORDER — WARFARIN SODIUM 5 MG PO TABS: 15.0000 mg | ORAL_TABLET | Freq: Every day | ORAL | 1 refills | Status: DC

## 2017-01-27 NOTE — Patient Instructions (Signed)
Patient instructed to take medications as defined in the Anti-coagulation Track section of this encounter.  Patient instructed to take today's dose.  Patient instructed to take 3 of your 5mg peach-colored warfarin tablets by mouth, once-daily, at 6PM each day. Patient verbalized understanding of these instructions.    

## 2017-01-27 NOTE — Progress Notes (Signed)
Anti-Coagulation Progress Note  Michele Gillespie is a 41 y.o. female who is currently on an anti-coagulation regimen.    RECENT RESULTS: Recent results are below, the most recent result is correlated with a dose of 105 mg. per week: Lab Results  Component Value Date   INR 3.30 01/27/2017   INR 3.60 01/06/2017   INR 2.40 12/23/2016    ANTI-COAG DOSE: Anticoagulation Dose Instructions as of 01/27/2017      [name] Tue Wed Thu Fri Sat   New Dose 15 mg 15 mg 15 mg 15 mg 15 mg 15 mg 15 mg    Description   Take 3 of your 5mg  peach-colored warfarin tablets by mouth, once-daily at 6PM all days of week.      ANTICOAG SUMMARY: Anticoagulation Episode Summary    Current INR goal:   2.0-3.0  TTR:   47.0 % (4.7 y)  Next INR check:   02/10/2017  INR from last check:   3.30! (01/27/2017)  Weekly max dose:     Target end date:   Indefinite  [facility] check [facility]   [facility]  Preferred lab:     Send INR reminders to:      Indications   HYPERCOAGULABLE STATE PRIMARY [D68.59] Long term current use of anticoagulant [Z79.01]       Comments:           ANTICOAG TODAY: Anticoagulation Summary  As of 01/27/2017   INR goal:   2.0-3.0  TTR:     Today's INR:   3.30!  Next INR check:   02/10/2017  Target end date:   Indefinite   Indications   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  Patient instructed to take today's dose.  Patient instructed to take 3 of your 5mg  peach-colored warfarin tablets by mouth, once-daily, at [facility] each day.  Patient verbalized understanding of these instructions.       FOLLOW-UP Return in 2 weeks (on  02/10/2017) for Follow up INR at 1030h.  [name], III Pharm.D., CACP

## 2017-02-10 ENCOUNTER — Ambulatory Visit: Payer: Medicaid Other

## 2017-02-10 ENCOUNTER — Emergency Department (HOSPITAL_COMMUNITY): Payer: Medicaid Other

## 2017-02-10 ENCOUNTER — Encounter (HOSPITAL_COMMUNITY): Payer: Self-pay | Admitting: Emergency Medicine

## 2017-02-10 DIAGNOSIS — Z5321 Procedure and treatment not carried out due to patient leaving prior to being seen by health care provider: Secondary | ICD-10-CM | POA: Diagnosis not present

## 2017-02-10 DIAGNOSIS — R079 Chest pain, unspecified: Secondary | ICD-10-CM | POA: Insufficient documentation

## 2017-02-10 LAB — BASIC METABOLIC PANEL
ANION GAP: 4 — AB (ref 5–15)
BUN: 14 mg/dL (ref 6–20)
CALCIUM: 8.8 mg/dL — AB (ref 8.9–10.3)
CO2: 25 mmol/L (ref 22–32)
CREATININE: 1.08 mg/dL — AB (ref 0.44–1.00)
Chloride: 109 mmol/L (ref 101–111)
GLUCOSE: 101 mg/dL — AB (ref 65–99)
Potassium: 3.7 mmol/L (ref 3.5–5.1)
Sodium: 138 mmol/L (ref 135–145)

## 2017-02-10 LAB — CBC
HCT: 38.9 % (ref 36.0–46.0)
HEMOGLOBIN: 12.5 g/dL (ref 12.0–15.0)
MCH: 24.7 pg — ABNORMAL LOW (ref 26.0–34.0)
MCHC: 32.1 g/dL (ref 30.0–36.0)
MCV: 76.9 fL — ABNORMAL LOW (ref 78.0–100.0)
PLATELETS: 172 10*3/uL (ref 150–400)
RBC: 5.06 MIL/uL (ref 3.87–5.11)
RDW: 15.8 % — ABNORMAL HIGH (ref 11.5–15.5)
WBC: 3.4 10*3/uL — ABNORMAL LOW (ref 4.0–10.5)

## 2017-02-10 LAB — I-STAT TROPONIN, ED: TROPONIN I, POC: 0 ng/mL (ref 0.00–0.08)

## 2017-02-10 LAB — PROTIME-INR
INR: 1.74
PROTHROMBIN TIME: 20.5 s — AB (ref 11.4–15.2)

## 2017-02-10 NOTE — ED Triage Notes (Signed)
Pt to ED c/o new onset CP that started on L side and radiated to mid chest x 7 hours accompanied by SOB and nausea. Pt reports hx blood clots - had a DVT in L axilla in March and one in R wrist 2 weeks ago. Pt also states she has one in her carotid (unknown what side). Pt reports being on coumadin and her INR was 3.3 last time. She states she's never had this chest pain before. SOB on exertion and rest. Resp currently e/u, skin warm/dry.

## 2017-02-11 ENCOUNTER — Emergency Department (HOSPITAL_COMMUNITY)
Admission: EM | Admit: 2017-02-11 | Discharge: 2017-02-11 | Disposition: A | Discharge status: Home / Self Care | Payer: Medicaid Other | Source: Home / Self Care

## 2017-02-11 ENCOUNTER — Emergency Department (HOSPITAL_COMMUNITY)
Admission: EM | Admit: 2017-02-11 | Discharge: 2017-02-11 | Disposition: A | Discharge status: Home / Self Care | Payer: Medicaid Other | Attending: Dermatology | Admitting: Dermatology

## 2017-02-11 ENCOUNTER — Encounter (HOSPITAL_COMMUNITY): Payer: Self-pay | Admitting: Nurse Practitioner

## 2017-02-11 LAB — PROTIME-INR
INR: 1.75
PROTHROMBIN TIME: 20.7 s — AB (ref 11.4–15.2)

## 2017-02-11 LAB — CBC
HEMATOCRIT: 39.8 % (ref 36.0–46.0)
HEMOGLOBIN: 12.9 g/dL (ref 12.0–15.0)
MCH: 24.8 pg — ABNORMAL LOW (ref 26.0–34.0)
MCHC: 32.4 g/dL (ref 30.0–36.0)
MCV: 76.5 fL — ABNORMAL LOW (ref 78.0–100.0)
Platelets: 170 10*3/uL (ref 150–400)
RBC: 5.2 MIL/uL — ABNORMAL HIGH (ref 3.87–5.11)
RDW: 15.9 % — ABNORMAL HIGH (ref 11.5–15.5)
WBC: 2.8 10*3/uL — AB (ref 4.0–10.5)

## 2017-02-11 LAB — BASIC METABOLIC PANEL
ANION GAP: 8 (ref 5–15)
BUN: 10 mg/dL (ref 6–20)
CO2: 22 mmol/L (ref 22–32)
Calcium: 9.1 mg/dL (ref 8.9–10.3)
Chloride: 109 mmol/L (ref 101–111)
Creatinine, Ser: 0.95 mg/dL (ref 0.44–1.00)
GFR calc Af Amer: >60 mL/min (ref >60)
Glucose, Bld: 87 mg/dL (ref 65–99)
POTASSIUM: 3.9 mmol/L (ref 3.5–5.1)
SODIUM: 139 mmol/L (ref 135–145)

## 2017-02-11 LAB — I-STAT TROPONIN, ED: Troponin i, poc: 0 ng/mL (ref 0.00–0.08)

## 2017-02-11 NOTE — ED Triage Notes (Signed)
Patient did not answer when called for room x 1.

## 2017-02-11 NOTE — ED Triage Notes (Signed)
Pt presents with c/o CP. The pain began yesterday. She reports dizziness, lightheadedness, SOB. She denies fevers, chills, cough. She is currently being treated for bilateral upper extremity venous thromboses.

## 2017-02-11 NOTE — ED Notes (Signed)
Called for pt x3, no response. 

## 2017-02-11 NOTE — ED Triage Notes (Signed)
Pt encouraged to stay but ambulated out of department 

## 2017-02-12 ENCOUNTER — Telehealth: Payer: Self-pay | Admitting: *Deleted

## 2017-02-12 NOTE — Telephone Encounter (Signed)
Patient called complaining of Left sided chest pain radiating towards L temple, rates 7/10, constant . Went to ED yesterday for same compalints. EKG, CXR done but did not wait for results because she said it was taking too long & she had been waiting for 4 hours.  Advised to go back to ED but refused & prefer to wait til PCP appt tomorrow @ 3:45p.  Advised if symptoms get worse to go to ED.

## 2017-02-13 ENCOUNTER — Ambulatory Visit (INDEPENDENT_AMBULATORY_CARE_PROVIDER_SITE_OTHER): Payer: Medicaid Other | Admitting: Pharmacist

## 2017-02-13 ENCOUNTER — Encounter (INDEPENDENT_AMBULATORY_CARE_PROVIDER_SITE_OTHER): Payer: Medicaid Other | Admitting: Internal Medicine

## 2017-02-13 DIAGNOSIS — D6859 Other primary thrombophilia: Secondary | ICD-10-CM

## 2017-02-13 DIAGNOSIS — Z7901 Long term (current) use of anticoagulants: Secondary | ICD-10-CM | POA: Diagnosis not present

## 2017-02-13 LAB — POCT INR: INR: 2.1

## 2017-02-13 NOTE — Progress Notes (Signed)
Anti-Coagulation Progress Note  Michele Gillespie is a 41 y.o. female who is currently on an anti-coagulation regimen of warfarin for hypercoagulable state [D68.59], and long term use of anticoagulant [Z79.01]. As part of her annual assessment by her primary care provider, a discussion of continued risks vs. Benefits should be held with patient annually, and with the advice and consent of the patient--warfarin therapy should continue or be discontinued based upon informed decision making.    RECENT RESULTS: Recent results are below, the most recent result is correlated with a dose of 105 mg. per week: Lab Results  Component Value Date   INR 2.10 02/13/2017   INR 1.75 02/11/2017   INR 1.74 02/10/2017    ANTI-COAG DOSE: Anticoagulation Warfarin Dose Instructions as of 02/13/2017      [name] Tue Wed Thu Fri Sat   New Dose 15 mg 20 mg 15 mg 15 mg 20 mg 15 mg 15 mg    Description   Take 3 of your 5mg  peach-colored warfarin tablets by mouth, once-daily at 6PM all days of week--EXCEPT on MONDAYS and THURSDAYS--take 4 of your 5mg  peach-colored warfarin tablets by mouth, once-daily at [facility] on these days.       ANTICOAG SUMMARY: Anticoagulation Episode Summary    Current INR goal:   2.0-3.0  TTR:   47.1 % (4.7 y)  Next INR check:   03/10/2017  INR from last check:   2.10 (02/13/2017)  Weekly max warfarin dose:     Target end date:   Indefinite  INR check [facility]   [facility]  Preferred lab:     Send INR reminders to:      Indications   HYPERCOAGULABLE STATE PRIMARY [D68.59] Long term current use of anticoagulant [Z79.01]       Comments:           ANTICOAG TODAY: Anticoagulation Summary  As of 02/13/2017   INR goal:   2.0-3.0  TTR:     Today's INR:   2.10  Next INR check:   03/10/2017  Target end date:   Indefinite   Indications   HYPERCOAGULABLE STATE PRIMARY [D68.59] Long term current use of anticoagulant [Z79.01]        Anticoagulation Episode Summary    INR check  location[facility]   [facility]   Preferred lab:      Send INR reminders to:      Comments:         PATIENT INSTRUCTIONS: Patient Instructions  Patient instructed to take medications as defined in the Anti-coagulation Track section of this encounter.  Patient instructed to take today's dose.  Patient instructed to take 3 of your 5mg  peach-colored warfarin tablets by mouth, once-daily at 6PM all days of week--EXCEPT on MONDAYS and THURSDAYS--take 4 of your 5mg  peach-colored warfarin tablets by mouth, once-daily at [facility] on these days.  Patient verbalized understanding of these instructions.       FOLLOW-UP Return in 4 weeks (on 03/10/2017) for Follow up INR at 0945h.  [name], III Pharm.D., CACP

## 2017-02-13 NOTE — Patient Instructions (Signed)
Patient instructed to take medications as defined in the Anti-coagulation Track section of this encounter.  Patient instructed to take today's dose.  Patient instructed to take 3 of your 5mg  peach-colored warfarin tablets by mouth, once-daily at 6PM all days of week--EXCEPT on MONDAYS and THURSDAYS--take 4 of your 5mg  peach-colored warfarin tablets by mouth, once-daily at [facility] on these days.  Patient verbalized understanding of these instructions.

## 2017-02-14 ENCOUNTER — Encounter: Payer: Self-pay | Admitting: Internal Medicine

## 2017-02-14 ENCOUNTER — Ambulatory Visit (INDEPENDENT_AMBULATORY_CARE_PROVIDER_SITE_OTHER): Payer: Medicaid Other | Admitting: Internal Medicine

## 2017-02-14 ENCOUNTER — Ambulatory Visit (HOSPITAL_COMMUNITY)
Admission: RE | Admit: 2017-02-14 | Discharge: 2017-02-14 | Disposition: A | Discharge status: Home / Self Care | Payer: Medicaid Other | Source: Ambulatory Visit | Attending: Internal Medicine | Admitting: Internal Medicine

## 2017-02-14 VITALS — BP 148/104 | HR 67 | Temp 97.8°F | Ht 65.0 in | Wt 213.5 lb

## 2017-02-14 DIAGNOSIS — Z86711 Personal history of pulmonary embolism: Secondary | ICD-10-CM | POA: Insufficient documentation

## 2017-02-14 DIAGNOSIS — F329 Major depressive disorder, single episode, unspecified: Secondary | ICD-10-CM | POA: Diagnosis not present

## 2017-02-14 DIAGNOSIS — R079 Chest pain, unspecified: Secondary | ICD-10-CM | POA: Diagnosis present

## 2017-02-14 DIAGNOSIS — Z86718 Personal history of other venous thrombosis and embolism: Secondary | ICD-10-CM | POA: Diagnosis not present

## 2017-02-14 DIAGNOSIS — J45909 Unspecified asthma, uncomplicated: Secondary | ICD-10-CM | POA: Diagnosis not present

## 2017-02-14 LAB — TROPONIN I

## 2017-02-14 MED ORDER — OMEPRAZOLE 20 MG PO CPDR: 20.0000 mg | DELAYED_RELEASE_CAPSULE | Freq: Every day | ORAL | 3 refills | Status: DC

## 2017-02-14 MED ORDER — DICLOFENAC SODIUM 1 % TD GEL: 2.0000 g | Freq: Four times a day (QID) | TRANSDERMAL | 0 refills | Status: DC

## 2017-02-14 NOTE — Assessment & Plan Note (Addendum)
The patient presents with ongoing chest pain. This has been going on for at least 72 hours. If my chart review is accurate the patient recently presented to the emergency department on 02/10/2017 for chest pain and at that time had a troponin and EKG which were normal. She did not stay for evaluation because "it was taking too long". She left the emergency department and it is unclear but it does appear that she returned to the emergency department later on. Again she left without being evaluated. She had an appointment scheduled to see me yesterday afternoon as I am her PCP but got here early and did not wish to wait and left clinic. She made an appointment today in the acute care clinic and presents with ongoing chest pain. The patient had a negative troponin on 02/11/2017 and 02/10/2017. Again EKG at prior emergency department visit showed no acute ST segment elevation. Her story and history of chest pain over the last 72 hours is extremely atypical. However, the patient has had multiple DVTs and PEs in the past and is on anticoagulation for these. She saw Dr. Elie Confer yesterday afternoon in the anticoagulation clinic (did not mention chest pain to him) at which time her INR was therapeutic at 2.10, her current therapeutic INR range is between 2 and 3. At this time I think coronary ischemia is extremely low on the differential for the cause of her chest pain given her therapeutic INR and atypical story in combination with 2 recent negative troponins and a normal EKG without acute ST segment elevation. However, given that she continues to endorse  ongoing chest pain we will proceed with stat troponin in the office today as well as an EKG. the patient saw cardiology in June 2017 and had a stress test which was overall unremarkable. Her chest pain today is described as retrosternal. It is worse after fried food and is 4 out of 10 in severity. She has a severe history of gastroesophageal reflux disease and has not  recently been on a proton pump inhibitor. She has no associated shortness of breath. No nausea, vomiting. No diaphoresis. No radiation of pain down the left arm. Her pain seems very atypical for angina. I think this most likely revisions gastroesophageal reflux disease. Alternatively, this could be muscular skeletal pain given that the patient's son is in a wheelchair and she has been moving him up and down stairs lately.   EKG today in clinic unchanged from prior. Troponin negative.  Given the history of present illness and  negative troponins over the past 3 days and normal EKGs I suspect her pain is not ischemic or cardiac in nature. I will treat with PPI and Voltaren gel for possible MSK strain. She also has scheduled follow-up with hematology which I will encourage her to keep.  -- PPI -- Voltaren gel -- If symptoms worsen reports the emergency department

## 2017-02-14 NOTE — Progress Notes (Signed)
This encounter was created in error - please disregard.

## 2017-02-14 NOTE — Patient Instructions (Signed)
It was a pleasure seeing you today. Thank you for choosing [facility] for your healthcare needs.   I have prescribed acid reflux medication. Please take this daily.  I have also prescribed gel for pain relief that you may apply to your chest up to 3 times daily.  Please ensure you make her appointment with hematology with a blood doctor.  Please schedule an appointment to be seen by her primary care physician in 4 weeks Heartburn Heartburn is a type of pain or discomfort that can happen in the throat or chest. It is often described as a burning pain. It may also cause a bad taste in the mouth. Heartburn may feel worse when you lie down or bend over. It may be caused by stomach contents that move back up (reflux) into the tube that connects the mouth with the stomach (esophagus). Follow these instructions at home: Take these actions to lessen your discomfort and to help avoid problems. Diet   Follow a diet as told by your doctor. You may need to avoid foods and drinks such as:  Coffee and tea (with or without caffeine).  Drinks that contain alcohol.  Energy drinks and sports drinks.  Carbonated drinks or sodas.  Chocolate and cocoa.  Peppermint and mint flavorings.  Garlic and onions.  Horseradish.  Spicy and acidic foods, such as peppers, chili powder, curry powder, vinegar, hot sauces, and BBQ sauce.  Citrus fruit juices and citrus fruits, such as oranges, lemons, and limes.  Tomato-based foods, such as red sauce, chili, salsa, and pizza with red sauce.  Fried and fatty foods, such as donuts, french fries, potato chips, and high-fat dressings.  High-fat meats, such as hot dogs, rib eye steak, sausage, ham, and bacon.  High-fat dairy items, such as whole milk, butter, and cream cheese.  Eat small meals often. Avoid eating large meals.  Avoid drinking large amounts of liquid with your meals.  Avoid eating meals during the 2-3 hours before bedtime.  Avoid lying down  right after you eat.  Do not exercise right after you eat. General instructions   Pay attention to any changes in your symptoms.  Take over-the-counter and prescription medicines only as told by your doctor. Do not take aspirin, ibuprofen, or other NSAIDs unless your doctor says it is okay.  Do not use any tobacco products, including cigarettes, chewing tobacco, and e-cigarettes. If you need help quitting, ask your doctor.  Wear loose clothes. Do not wear anything tight around your waist.  Raise (elevate) the head of your bed about 6 inches (15 cm).  Try to lower your stress. If you need help doing this, ask your doctor.  If you are overweight, lose an amount of weight that is healthy for you. Ask your doctor about a safe weight loss goal.  Keep all follow-up visits as told by your doctor. This is important. Contact a doctor if:  You have new symptoms.  You lose weight and you do not know why it is happening.  You have trouble swallowing, or it hurts to swallow.  You have wheezing or a cough that keeps happening.  Your symptoms do not get better with treatment.  You have heartburn often for more than two weeks. Get help right away if:  You have pain in your arms, neck, jaw, teeth, or back.  You feel sweaty, dizzy, or light-headed.  You have chest pain or shortness of breath.  You throw up (vomit) and your throw up looks like blood  or coffee grounds.  Your poop (stool) is bloody or black. This information is not intended to replace advice given to you by your health care provider. Make sure you discuss any questions you have with your health care provider. Document Released: 05/29/2011 Document Revised: 02/22/2016 Document Reviewed: 01/11/2015 Elsevier Interactive Patient Education  2017 [employer].

## 2017-02-14 NOTE — Progress Notes (Signed)
   CC: Chest pain HPI: [name] is a 41 y.o. female with a past medical history as listed below who presents with chest pain.  Past Medical History:  Diagnosis Date  . Anemia 2007    microcytic anemia, baseline hemoglobin 10-11, MCV at baseline 72-77, secondary to iron deficiency  . Arm DVT (deep venous thromboembolism), acute [facility])  September 23, 2009, January 05, 2010    Doppler study significant with indeterminant age DVT involving the left upper extremity, Doppler performed January 05, 2010 consistent with acute DVT involving the left upper extremity  . Asthma   . Depression   . Leukopenia 2008    unclear etiology baseline WBC  2.8-3.7  . Lung nodule  June 10, 2008    stable tiny noduke noted along the minor fissure of the right lung on CT angio September 11, 09 -  stable for 2 years and consistent with benign disease  . Pulmonary embolism [facility])  September 07, 2005    her CT angiogram - positive for pulmonary emboli to several branches of the right lower lobe- relatively small clot burden, clear lung; patient started on Coumadin; CT angiogram on January 10, 2006 showed resolution of previously seen pulmonary emboli with minimal basilar atelectasis  . Schizophrenia ([place])      Review of Systems: Denies shortness of breath. Denies n/v and abdominal pain. Denies diaphoresis.  Physical Exam: Vitals:   02/14/17 1030  BP: (!) 148/104  Pulse: 67  Temp: 97.8 F (36.6 C)  TempSrc: Oral  SpO2: 100%  Weight: 213 lb 8 oz (96.8 kg)  Height: 5\' 5"  (1.651 m)   BP (!) 148/104 (BP Location: Left Arm, Patient Position: Sitting, Cuff Size: Normal)   Pulse 67   Temp 97.8 F (36.6 C) (Oral)   Ht 5\' 5"  (1.651 m)   Wt 213 lb 8 oz (96.8 kg)   LMP 11/15/2016 (Exact Date)   SpO2 100%   BMI 35.53 kg/m  General appearance: alert and cooperative Lungs: clear to auscultation bilaterally Heart: regular rate and rhythm, S1, S2 normal, no murmur, click, rub or gallop Abdomen: soft,  non-tender; bowel sounds normal; no masses,  no organomegaly Extremities: extremities normal, atraumatic, no cyanosis or edema Right wrist in splint. Assessment & Plan:  See encounters tab for problem based medical decision making. Patient discussed with Dr. Daryll Drown  Signed: Ophelia Shoulder, MD 02/14/2017, 10:47 AM  Pager: [phone]

## 2017-02-17 NOTE — Progress Notes (Signed)
I reviewed Dr. Gladstone Pih note, patient is on [facility] for hypercoagulable state, INR was 2.1 which was at goal.

## 2017-02-17 NOTE — Progress Notes (Signed)
Internal Medicine Clinic Attending  Case discussed with Dr. Taylor at the time of the visit.  We reviewed the resident's history and exam and pertinent patient test results.  I agree with the assessment, diagnosis, and plan of care documented in the resident's note. 

## 2017-02-20 ENCOUNTER — Other Ambulatory Visit: Payer: Self-pay | Admitting: Internal Medicine

## 2017-03-10 ENCOUNTER — Ambulatory Visit: Payer: Medicaid Other

## 2017-03-10 ENCOUNTER — Encounter: Payer: Self-pay | Admitting: *Deleted

## 2017-03-11 ENCOUNTER — Ambulatory Visit (INDEPENDENT_AMBULATORY_CARE_PROVIDER_SITE_OTHER): Payer: Medicaid Other | Admitting: Pharmacist

## 2017-03-11 DIAGNOSIS — D6859 Other primary thrombophilia: Secondary | ICD-10-CM | POA: Diagnosis not present

## 2017-03-11 DIAGNOSIS — Z7901 Long term (current) use of anticoagulants: Secondary | ICD-10-CM

## 2017-03-11 LAB — POCT INR: INR: 3.8

## 2017-03-11 MED ORDER — WARFARIN SODIUM 5 MG PO TABS: 15.0000 mg | ORAL_TABLET | Freq: Every day | ORAL | 1 refills | Status: DC

## 2017-03-11 NOTE — Progress Notes (Signed)
Anticoagulation Management Michele Gillespie is a 41 y.o. female who reports to the clinic for monitoring of warfarin treatment.    Indication: DVT, PE and hypercoagulable state Duration: indefiniteCarla L Gillespie is a 41 y.o. female who reports to the clinic for monitoring of warfarin treatment.   Supervising physician: [facility] Visit History: Patient does not report signs/symptoms of bleeding or thromboembolism. She has a small 1 inch bruise on the back of her upper left arm which she states was caused by bumping on an edge.  Anticoagulation Episode Summary    Current INR goal:   2.0-3.0  TTR:   47.2 % (4.8 y)  Next INR check:   03/17/2017  INR from last check:   3.8! 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] Long term current use of anticoagulant [Z79.01]       Comments:          ASSESSMENT Recent Results: The most recent result is correlated with 115 mg per week: Lab Results  Component Value Date   INR 3.8 03/11/2017   INR 2.10 02/13/2017   INR 1.75 02/11/2017   Anticoagulation Dosing: INR as of 03/11/2017 and Previous Warfarin Dosing Information    INR Dt INR Goal Wkly Tot Sun Mon Tue Wed Thu Fri Sat   03/11/2017 3.8 2.0-3.0 115 mg 15 mg 20 mg 15 mg 15 mg 20 mg 15 mg 15 mg    Previous description   Take 3 of your 5mg  peach-colored warfarin tablets by mouth, once-daily at 6PM all days of week--EXCEPT on MONDAYS and THURSDAYS--take 4 of your 5mg  peach-colored warfarin tablets by mouth, once-daily at [facility] on these days.    Anticoagulation Warfarin Dose Instructions as of 03/11/2017      Total Sun Mon Tue Wed Thu Fri Sat   New Dose 105 mg 15 mg 15 mg 15 mg 15 mg 15 mg 15 mg 15 mg     (5 mg x 3)  (5 mg x 3)  (5 mg x 3)  (5 mg x 3)  (5 mg x 3)  (5 mg x 3)  (5 mg x 3)                           INR today: Supratherapeutic  PLAN Weekly dose was decreased by 9% to 105 mg per week, follow up 6 days  Patient Instructions  Patient educated about medication as defined in this encounter and verbalized understanding by repeating back instructions provided.    Patient advised to contact clinic or seek medical attention if signs/symptoms of bleeding or thromboembolism occur.  Patient verbalized understanding by repeating back information and was advised to contact me if further medication-related questions arise. Patient was also provided an information handout.  Follow-up Return in about 6 days (around 03/17/2017).  Flossie Dibble

## 2017-03-11 NOTE — Patient Instructions (Signed)
Patient educated about medication as defined in this encounter and verbalized understanding by repeating back instructions provided.   

## 2017-04-07 ENCOUNTER — Ambulatory Visit (INDEPENDENT_AMBULATORY_CARE_PROVIDER_SITE_OTHER): Payer: Medicaid Other | Admitting: Pharmacist

## 2017-04-07 DIAGNOSIS — D6859 Other primary thrombophilia: Secondary | ICD-10-CM

## 2017-04-07 DIAGNOSIS — Z7901 Long term (current) use of anticoagulants: Secondary | ICD-10-CM | POA: Diagnosis not present

## 2017-04-07 LAB — POCT INR: INR: 8

## 2017-04-07 NOTE — Progress Notes (Signed)
INTERNAL MEDICINE TEACHING ATTENDING ADDENDUM ° °I agree with pharmacy recommendations as outlined in their note.  ° °-Takeila Thayne MD ° °

## 2017-04-07 NOTE — Progress Notes (Signed)
Anticoagulation Management Michele Gillespie is a 41 y.o. female who reports to the clinic for monitoring of warfarin treatment.    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  Duration: indefinite Supervising physician: Lalla Brothers  Anticoagulation Clinic Visit History: Patient does not report signs/symptoms of bleeding or thromboembolism  Other recent changes: No diet, medications, lifestyle changes endorsed.  Anticoagulation Episode Summary    Current INR goal:   2.0-3.0  TTR:   46.5 % (4.8 y)  Next INR check:   04/14/2017  INR from last check:   8.0! 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 Clinical decision consistent with current guidelines promulgated in the supplement to the Journal CHEST--I.e. For INR's up to 10--one may elect (in absence of bleeding or risks for bleeding) among several options which include omitting/holding doses.  Lab Results  Component Value Date   INR 8.0 04/07/2017   INR 3.8 03/11/2017   INR 2.10 02/13/2017    Anticoagulation Dosing: INR as of 04/07/2017 and Previous Warfarin Dosing Information    INR Dt INR Goal Wkly Tot Sun Mon Tue Wed Thu Fri Sat   04/07/2017 8.0 2.0-3.0 120 mg 30 mg 15 mg 15 mg 15 mg 15 mg 15 mg 15 mg   Patient deviated from recommended dosing.       Anticoagulation Warfarin Dose Instructions as of 04/07/2017      Total Sun Mon Tue Wed Thu Fri Sat   New Dose 75 mg 15 mg Hold Hold 15 mg 15 mg 15 mg 15 mg     (5 mg x 3)  -  -  (5 mg x 3)  (5 mg x 3)  (5 mg x 3)  (5 mg x 3)                          Description   OMIT/DO NOT TAKE today's dose (Monday April 07, 2017) OR tomorrow's dose (Tuesday April 08, 2017). On Wednesday, April 09, 2017--resume warfarin taking your normal scheduled dose of 3 tablets of your 5mg  peach-colored warfarin tablets.      INR today: Supratherapeutic  PLAN Weekly dose was decreased by 13% to 105 mg per week--with TWO doses omitted (today and tomorrow's doses).   Patient Instructions  Patient instructed to take medications as defined in the Anti-coagulation Track section of this encounter.  Patient instructed to OMIT/DO NOT TAKE today's dose (Monday April 07, 2017) OR tomorrow's dose (Tuesday April 08, 2017). On Wednesday, April 09, 2017--resume warfarin taking your normal scheduled dose of 3 tablets of your 5mg  peach-colored warfarin tablets.  Patient verbalized understanding of these instructions.     Patient advised to contact clinic or seek medical attention if signs/symptoms of bleeding or thromboembolism occur.  Patient verbalized understanding by repeating back information and was advised to contact me if further medication-related questions arise. Patient was also provided an information handout.  Follow-up Return in 7  days (on 04/14/2017) for Follow up INR at 4PM.   Ceasar Lund, Eustaquio Boyden PharmD, CACP, CPP  15 minutes spent face-to-face with the patient during the encounter. 50% of time spent on education. 50% of time was spent on finger-stick point of care INR sample collection, processing, interpretation, discussion with faculty attending physician and data-entry into EPIC/CHL and [email or web address]

## 2017-04-07 NOTE — Patient Instructions (Signed)
Patient instructed to take medications as defined in the Anti-coagulation Track section of this encounter.  Patient instructed to OMIT/DO NOT TAKE today's dose (Monday April 07, 2017) OR tomorrow's dose (Tuesday April 08, 2017). On Wednesday, April 09, 2017--resume warfarin taking your normal scheduled dose of 3 tablets of your 5mg  peach-colored warfarin tablets.  Patient verbalized understanding of these instructions.

## 2017-04-14 ENCOUNTER — Ambulatory Visit: Payer: Medicaid Other

## 2017-04-15 ENCOUNTER — Ambulatory Visit (INDEPENDENT_AMBULATORY_CARE_PROVIDER_SITE_OTHER): Payer: Medicaid Other | Admitting: Pharmacist

## 2017-04-15 ENCOUNTER — Ambulatory Visit: Payer: Medicaid Other

## 2017-04-15 DIAGNOSIS — Z7901 Long term (current) use of anticoagulants: Secondary | ICD-10-CM

## 2017-04-15 DIAGNOSIS — D6859 Other primary thrombophilia: Secondary | ICD-10-CM

## 2017-04-15 LAB — POCT INR: INR: 2.7

## 2017-04-15 NOTE — Patient Instructions (Signed)
Patient instructed to take medications as defined in the Anti-coagulation Track section of this encounter.  Patient instructed to take today's dose.  Patient instructed to take  2 & 1/2 tablets of your 5mg  peach-colored warfarin tablets on TUESDAYS and FRIDAYS; all other days, take THREE (3) tablets of your 5mg  peach colored warfarin tablets. Patient verbalized understanding of these instructions.

## 2017-04-15 NOTE — Progress Notes (Signed)
Anticoagulation Management Michele Gillespie is a 41 y.o. female who reports to the clinic for monitoring of warfarin treatment.    Indication: Hypercoagulable state; Upper Extremity DVT March 2018, subsequent recurrence in May 2018. On long term anticoagulation.  Duration: indefinite Supervising physician: [facility] Visit History: Patient does not report signs/symptoms of bleeding or thromboembolism  Other recent changes: No diet, medications, lifestyle changes endorsed by patient to me.  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  Lab Results  Component Value Date   INR 2.70 04/15/2017   INR 8.0 04/07/2017   INR 3.8 03/11/2017    Anticoagulation Dosing: INR as of 04/15/2017 and Previous Warfarin Dosing Information    INR Dt INR Goal [employer] Sun Mon Tue Wed Thu Fri Sat   04/15/2017 2.70 2.0-3.0 75 mg 15 mg 0 mg 0 mg 15 mg 15 mg 15 mg 15 mg    Previous description   OMIT/DO NOT TAKE today's dose (Monday April 07, 2017) OR tomorrow's dose (Tuesday April 08, 2017). On Wednesday, April 09, 2017--resume warfarin taking your normal scheduled dose of 3 tablets of your 5mg  peach-colored warfarin tablets.    Anticoagulation Warfarin Dose Instructions as of 04/15/2017      Total Sun Mon Tue Wed Thu Fri Sat   New Dose 75 mg 15 mg 0 mg 0 mg 15 mg 15 mg 15 mg 15 mg     (5 mg x 3)  -  -  (5 mg x 3)  (5 mg x 3)  (5 mg x 3)  (5 mg x 3)                          Description   Take 2 & 1/2 tablets of your 5mg  peach-colored warfarin tablets on TUESDAYS and FRIDAYS; all other days, take THREE (3) tablets of your 5mg  peach colored warfarin tablets.      INR today: Therapeutic  PLAN Weekly dose was increased 26 % to 100 mg per week  Patient Instructions  Patient instructed to take medications as defined in the Anti-coagulation Track section of this encounter.  Patient instructed to take today's dose.  Patient instructed to take  2 & 1/2 tablets of your 5mg  peach-colored warfarin tablets on TUESDAYS and FRIDAYS; all other days, take THREE (3) tablets of your 5mg  peach colored warfarin tablets. Patient verbalized understanding of these instructions.     Patient advised to contact clinic or seek medical attention if signs/symptoms of bleeding or thromboembolism occur.  Patient verbalized understanding by repeating back information and was advised to contact me if further medication-related questions arise. Patient was also provided an information handout.  Follow-up Return in 2 weeks (on 04/28/2017) for Follow up INR at 3:15PM.  Verna Desrocher III,  Eustaquio Boyden PharmD, CACP, CPP  15 minutes spent face-to-face with the patient during the encounter. 50% of time spent on education. 50% of time was spent on fingerstick point of care INR sample collection, processing, interpretation of results, entry in to EPIC/CHL and [email or web address]

## 2017-04-28 ENCOUNTER — Ambulatory Visit (INDEPENDENT_AMBULATORY_CARE_PROVIDER_SITE_OTHER): Payer: Medicaid Other | Admitting: Pharmacist

## 2017-04-28 DIAGNOSIS — D6859 Other primary thrombophilia: Secondary | ICD-10-CM

## 2017-04-28 DIAGNOSIS — Z7901 Long term (current) use of anticoagulants: Secondary | ICD-10-CM | POA: Diagnosis not present

## 2017-04-28 LAB — POCT INR: INR: 2.5

## 2017-04-28 NOTE — Progress Notes (Signed)
Anticoagulation Management Michele Gillespie is a 41 y.o. female who reports to the clinic for monitoring of warfarin treatment.    Indication: Primary hypercoagulable state with history of VTE on chronic anticoagulation management.  Duration: indefinite Supervising physician: Lalla Brothers  Anticoagulation Clinic Visit History: Patient does not report signs/symptoms of bleeding or thromboembolism  Other recent changes: No diet, medications, lifestyle changes endorsed by the patient.  Anticoagulation Episode Summary    Current INR goal:   2.0-3.0  TTR:   46.7 % (4.9 y)  Next INR check:   05/26/2017  INR from last check:   2.50 (04/28/2017)  Weekly max warfarin dose:     Target end date:   Indefinite  INR check location:   [facility]  Preferred lab:     Send INR reminders to:      Indications   HYPERCOAGULABLE STATE PRIMARY [D68.59] Long term current use of anticoagulant [Z79.01]       Comments:           No Known Allergies Prior to Admission medications   Medication Sig Start Date End Date Taking? Authorizing Provider  acetaminophen (TYLENOL) 325 MG tablet Take 650 mg by mouth every 6 (six) hours as needed for mild pain.   Yes [provider]  albuterol (PROVENTIL HFA) 108 (90 Base) MCG/ACT inhaler INHALE 2 PUFFS INTO THE LUNGS EVERY 6 HOURS AS NEEDED FOR WHEEZING. FOR SHORTNESS OF BREATH Patient taking differently: Inhale 2 puffs into the lungs every 6 (six) hours as needed for shortness of breath.  12/11/16  Yes Ophelia Shoulder, MD  ARIPiprazole (ABILIFY) 10 MG tablet Take 1 tablet (10 mg total) by mouth daily. 09/09/16  Yes Norman Herrlich, MD  diclofenac sodium (VOLTAREN) 1 % GEL Apply 2 g topically 4 (four) times daily. 02/14/17  Yes Ophelia Shoulder, MD  HYDROcodone-acetaminophen (NORCO/VICODIN) 5-325 MG tablet Take 2 tablets by mouth every 4 (four) hours as needed. 01/25/17  Yes Esaw Grandchild, MD  nicotine polacrilex (NICORETTE) 2 MG gum RX #3 Weeks 10-12: 1 piece  every 4-8 hours. Max 24 pieces per day. 10/20/15  Yes Emokpae, Ejiroghene E, MD  olopatadine (PATANOL) 0.1 % ophthalmic solution Place 1 drop into both eyes 2 (two) times daily. 08/20/16 08/20/17 Yes Ophelia Shoulder, MD  omeprazole (PRILOSEC) 20 MG capsule Take 1 capsule (20 mg total) by mouth daily. 02/14/17  Yes Ophelia Shoulder, MD  ondansetron (ZOFRAN-ODT) 4 MG disintegrating tablet Take 1 tablet (4 mg total) by mouth every 8 (eight) hours as needed for nausea or vomiting. 12/16/16  Yes Norman Herrlich, MD  warfarin (COUMADIN) 5 MG tablet Take 3 tablets (15 mg total) by mouth daily at 6 PM. 03/11/17  Yes Bartholomew Crews, MD   Past Medical History:  Diagnosis Date  . Anemia 2007    microcytic anemia, baseline hemoglobin 10-11, MCV at baseline 72-77, secondary to iron deficiency  . Arm DVT (deep venous thromboembolism), acute [facility])  September 23, 2009, January 05, 2010    Doppler study significant with indeterminant age DVT involving the left upper extremity, Doppler performed January 05, 2010 consistent with acute DVT involving the left upper extremity  . Asthma   . Depression   . Leukopenia 2008    unclear etiology baseline WBC  2.8-3.7  . Lung nodule  June 10, 2008    stable tiny noduke noted along the minor fissure of the right lung on CT angio September 11, 09 -  stable for 2 years and consistent with  benign disease  . Pulmonary embolism [facility])  September 07, 2005    her CT angiogram - positive for pulmonary emboli to several branches of the right lower lobe- relatively small clot burden, clear lung; patient started on Coumadin; CT angiogram on January 10, 2006 showed resolution of previously seen pulmonary emboli with minimal basilar atelectasis  . Schizophrenia [facility])    Social History   Social History  . Marital status: Married    Spouse name: N/A  . Number of children: N/A  . Years of education: N/A   Social History Main Topics  . Smoking status: Current Every Day Smoker    Packs/day:  0.25    Years: 0.00    Types: Cigarettes  . Smokeless tobacco: Never Used     Comment: ABOUT 6 CIGARETTES A DAY  . Alcohol use 0.0 oz/week  . Drug use: Yes    Types: Marijuana  . Sexual activity: Yes    Birth control/ protection: None     Comment: tubal   Other Topics Concern  . 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 Hypertension Mother   . Congestive Heart Failure Mother   . Diabetes Father   . Birth defects Maternal Aunt   . Birth defects Maternal Uncle   . Diabetes Paternal Grandmother   . Breast cancer Sister   . Breast cancer Sister     ASSESSMENT Recent Results: The most recent result is correlated with 100 mg per week: Lab Results  Component Value Date   INR 2.50 04/28/2017   INR 2.70 04/15/2017   INR 8.0 04/07/2017    Anticoagulation Dosing: INR as of 04/28/2017 and Previous Warfarin Dosing Information    INR Dt INR Goal Madilyn Fireman Sun Mon Tue Wed Thu Fri Sat   04/28/2017 2.50 2.0-3.0 100 mg 15 mg 15 mg 12.5 mg 15 mg 15 mg 12.5 mg 15 mg   Patient deviated from recommended dosing.       Previous description   Take 2 & 1/2 tablets of your 5mg  peach-colored warfarin tablets on TUESDAYS and FRIDAYS; all other days, take THREE (3) tablets of your 5mg  peach colored warfarin tablets.    Anticoagulation Warfarin Dose Instructions as of 04/28/2017      Total Sun Mon Tue Wed Thu Fri Sat   New Dose 100 mg 15 mg 15 mg 12.5 mg 15 mg 15 mg 12.5 mg 15 mg     (5 mg x 3)  (5 mg x 3)  (5 mg x 2.5)  (5 mg x 3)  (5 mg x 3)  (5 mg x 2.5)  (5 mg x 3)                         Description   Take 2 & 1/2 tablets of your 5mg  peach-colored warfarin tablets on TUESDAYS and FRIDAYS; all other days, take THREE (3) tablets of your 5mg  peach colored warfarin tablets.       INR today: Therapeutic  PLAN Weekly dose was unchanged.  Patient Instructions  Patient instructed to take medications as defined in the Anti-coagulation Track section of this encounter.  Patient instructed to take today's dose.  Patient instructed to take 2 & 1/2 tablets of  your 5mg  peach-colored warfarin tablets on TUESDAYS and FRIDAYS; all other days, take THREE (3) tablets of your 5mg  peach colored warfarin tablets. Patient verbalized understanding of these instructions.     Patient advised to contact clinic or seek medical attention if signs/symptoms of bleeding or thromboembolism occur.  Patient verbalized understanding by repeating back information and was advised to contact me if further medication-related questions arise. Patient was also provided an information handout.  Follow-up Return in 4 weeks (on 05/26/2017) for Follow up INR at 1145h.  Caryl Bis PharmD, CACP, CPP  15 minutes spent face-to-face with the patient during the encounter. 50% of time spent on education. 50% of time was spent on fingerstick point of care INR sample collection, processing, results interpretation and data entry in to EPIC/CHL and [email or web address]

## 2017-04-28 NOTE — Progress Notes (Signed)
INTERNAL MEDICINE TEACHING ATTENDING ADDENDUM ° °I agree with pharmacy recommendations as outlined in their note.  ° °-Duncan Vincent MD ° °

## 2017-04-28 NOTE — Patient Instructions (Signed)
Patient instructed to take medications as defined in the Anti-coagulation Track section of this encounter.  Patient instructed to take today's dose.  Patient instructed to take 2 & 1/2 tablets of your 5mg  peach-colored warfarin tablets on TUESDAYS and FRIDAYS; all other days, take THREE (3) tablets of your 5mg  peach colored warfarin tablets. Patient verbalized understanding of these instructions.

## 2017-05-10 ENCOUNTER — Encounter (HOSPITAL_COMMUNITY): Payer: Self-pay | Admitting: Emergency Medicine

## 2017-05-10 ENCOUNTER — Ambulatory Visit (HOSPITAL_COMMUNITY)
Admission: EM | Admit: 2017-05-10 | Discharge: 2017-05-10 | Disposition: A | Discharge status: Home / Self Care | Payer: Medicaid Other | Attending: Family Medicine | Admitting: Family Medicine

## 2017-05-10 DIAGNOSIS — B029 Zoster without complications: Secondary | ICD-10-CM | POA: Diagnosis not present

## 2017-05-10 MED ORDER — VALACYCLOVIR HCL 1 G PO TABS: 1000.0000 mg | ORAL_TABLET | Freq: Three times a day (TID) | ORAL | 0 refills | Status: AC

## 2017-05-10 NOTE — ED Provider Notes (Signed)
  [place]   [id number] 05/10/17 Arrival Time: 1452     SUBJECTIVE:  Michele Gillespie is a 41 y.o. female who presents with complaint of a rash on the right side of her nose.  The small vesicles first appeared this morning when patient was having itching on upper right side of her nose and noticed multiple small water blisters.    ROS: As per HPI.  Past Medical History:  Diagnosis Date  . Anemia 2007    microcytic anemia, baseline hemoglobin 10-11, MCV at baseline 72-77, secondary to iron deficiency  . Arm DVT (deep venous thromboembolism), acute [facility])  September 23, 2009, January 05, 2010    Doppler study significant with indeterminant age DVT involving the left upper extremity, Doppler performed January 05, 2010 consistent with acute DVT involving the left upper extremity  . Asthma   . Depression   . Leukopenia 2008    unclear etiology baseline WBC  2.8-3.7  . Lung nodule  June 10, 2008    stable tiny noduke noted along the minor fissure of the right lung on CT angio September 11, 09 -  stable for 2 years and consistent with benign disease  . Pulmonary embolism [facility])  September 07, 2005    her CT angiogram - positive for pulmonary emboli to several branches of the right lower lobe- relatively small clot burden, clear lung; patient started on Coumadin; CT angiogram on January 10, 2006 showed resolution of previously seen pulmonary emboli with minimal basilar atelectasis  . Schizophrenia ([place])     Immunization History  Administered Date(s) Administered  . Hepatitis A, Adult 08/16/2015, 02/13/2016  . Hepatitis B, adult 02/13/2016  . Hepatitis B, ped/adol 08/16/2015, 10/03/2015  . Influenza Whole 07/04/2009, 06/11/2010  . Influenza,inj,Quad PF,36+ Mos 06/20/2016  . PPD Test 03/08/2015  . Td 06/11/2010  . Tdap 08/15/2011     OBJECTIVE:  Vitals:   05/10/17 1524  BP: 126/81  Pulse: 67  Resp: 19  Temp: 99 F (37.2 C)  TempSrc: Oral  SpO2: 97%     General  appearance: alert, cooperative, appears stated age and no distress Head: Normocephalic, without obvious abnormality, atraumatic Eyes: conjunctivae/corneas clear. No results found.  No Known AllergiesPERRL, EOM's intact. Ears: normal TM's and external ear canals both ears Nose: Nares normal. Mucosa normal. No drainage or sinus tenderness. Throat: lips, mucosa, and tongue normal; teeth and gums normal Neck: no adenopathy and supple, symmetrical, trachea midline Extremities: extremities normal, atraumatic, no cyanosis or edema Skin: Multiple small vesicles on the right side of the nose adjacent to the eye.   Labs Reviewed - No data to display    PMHx, SurgHx, SocialHx, Medications, and Allergies were reviewed in the Visit Navigator and updated as appropriate.  Assessment: This appears to be a localized beginning outbreak of shingles.  Plan: Valtrex 1 g 3 times a day for a week. Patient told to return if rash develops on the opposite side of body or symptoms worsen.  Signed, Robyn Haber M.D.      Robyn Haber, MD 05/10/17 [phone]

## 2017-05-10 NOTE — ED Triage Notes (Signed)
Rash on face and itching, rash is burning, stinging sensation .  No area involved from neck down.  Blisters are right side of bridge of nose.  Noticed today.  Patient had a headache a few days ago.

## 2017-05-11 ENCOUNTER — Emergency Department (HOSPITAL_COMMUNITY)
Admission: EM | Admit: 2017-05-11 | Discharge: 2017-05-11 | Disposition: A | Discharge status: Home / Self Care | Payer: Medicaid Other | Attending: Emergency Medicine | Admitting: Emergency Medicine

## 2017-05-11 ENCOUNTER — Encounter (HOSPITAL_COMMUNITY): Payer: Self-pay | Admitting: Emergency Medicine

## 2017-05-11 DIAGNOSIS — Z5321 Procedure and treatment not carried out due to patient leaving prior to being seen by health care provider: Secondary | ICD-10-CM | POA: Diagnosis not present

## 2017-05-11 DIAGNOSIS — R51 Headache: Secondary | ICD-10-CM | POA: Insufficient documentation

## 2017-05-11 NOTE — ED Triage Notes (Signed)
Patient seen yesterday and diagnosed with shingles.  Patient now with headache and continuing with more shingles breakouts on legs and neck and face on the right side.  Patient states that the headache is worse and she has a knot under right ear.

## 2017-05-11 NOTE — ED Notes (Signed)
Text message received via iConnect stating pt had to leave

## 2017-05-12 ENCOUNTER — Emergency Department (HOSPITAL_COMMUNITY)
Admission: EM | Admit: 2017-05-12 | Discharge: 2017-05-12 | Discharge status: Against Medical Advice | Payer: Medicaid Other

## 2017-05-12 ENCOUNTER — Telehealth: Payer: Self-pay | Admitting: Pharmacist

## 2017-05-12 ENCOUNTER — Encounter (HOSPITAL_COMMUNITY): Payer: Self-pay | Admitting: Emergency Medicine

## 2017-05-12 ENCOUNTER — Encounter: Payer: Self-pay | Admitting: Internal Medicine

## 2017-05-12 ENCOUNTER — Emergency Department (HOSPITAL_COMMUNITY)
Admission: EM | Admit: 2017-05-12 | Discharge: 2017-05-12 | Discharge status: Against Medical Advice | Payer: Medicaid Other | Attending: Emergency Medicine | Admitting: Emergency Medicine

## 2017-05-12 ENCOUNTER — Ambulatory Visit (INDEPENDENT_AMBULATORY_CARE_PROVIDER_SITE_OTHER): Payer: Medicaid Other | Admitting: Internal Medicine

## 2017-05-12 DIAGNOSIS — Z5321 Procedure and treatment not carried out due to patient leaving prior to being seen by health care provider: Secondary | ICD-10-CM | POA: Diagnosis not present

## 2017-05-12 DIAGNOSIS — B029 Zoster without complications: Secondary | ICD-10-CM

## 2017-05-12 HISTORY — DX: Zoster without complications: B02.9

## 2017-05-12 NOTE — ED Notes (Signed)
Called for room X1 

## 2017-05-12 NOTE — ED Notes (Signed)
Called for pt with no answer 

## 2017-05-12 NOTE — ED Triage Notes (Signed)
Pt states that she was dx with shingles in her R eye yesterday and started taking the acyclovir but states she now has a lump behind her R ear that is painful. Alert and oriented.

## 2017-05-12 NOTE — Patient Instructions (Signed)
Michele Gillespie,  I am sorry to hear about your rash. Please continue to take Valtrex as previously prescribed. I have placed a referral to an eye doctor to have you evaluated. They will call you to schedule an appointment. If you have any questions or concerns, call our clinic at [phone] or after hours call [phone] and ask for the internal medicine resident on call. Thank you!  - Dr. Philipp Ovens

## 2017-05-12 NOTE — Assessment & Plan Note (Addendum)
Patient was seen in the ED 2 days ago for right sided facial rash and diagnosed with herpes zoster. She was discharged with a prescription for Valtrex. Since starting the medication, patient reports developing lumps on her neck that are painful and the rash has continued to spread. She is also endorsing irritation in her right eye with intermittent blurry vision. On exam, rash appears vesicular nature along the V1 trigeminal nerve branch. Pupils are reactive to light bilaterally and vision appears intact. However given the location of her rash along the ophthalmic nerve distribution and her symptoms, we discussed urgent referral to ophthalmology for further evaluation. Patient has agreed. She also reports she is sexually active with one long time partner, however, recently learn that he has not been monogamous. Will check HIV ab screen today.  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 Called patient with results. Reports she was seen by opthalmology 8/13 for dilated eye exam and again 8/14 for follow up. She was prescribed oral doxycycline in addition to valtrex. Patient continues to have itching in her right eye and has been using olopatadine prn. She has another appointment scheduled 8/29 with opthalmology. I have asked patient to schedule follow up with [place] in [facility] in 1-2 weeks. Instructed her to call sooner if symptoms progress or she experiences loss of vision.

## 2017-05-12 NOTE — Telephone Encounter (Signed)
Patient called at 2:13PM asking if there is a DDI with her warfarin and newly prescribed oral medication for "shingles" (Valtrex). No DDI's identified in data query.

## 2017-05-12 NOTE — Progress Notes (Signed)
   CC: Rash, lumps on neck   HPI:  [name] is a 41 y.o. female with past medical history outlined below here for evaluation of a rash and painful lumps on her neck. For the details of today's visit, please refer to the assessment and plan.  Past Medical History:  Diagnosis Date  . Anemia 2007    microcytic anemia, baseline hemoglobin 10-11, MCV at baseline 72-77, secondary to iron deficiency  . Arm DVT (deep venous thromboembolism), acute [facility])  September 23, 2009, January 05, 2010    Doppler study significant with indeterminant age DVT involving the left upper extremity, Doppler performed January 05, 2010 consistent with acute DVT involving the left upper extremity  . Asthma   . Depression   . Leukopenia 2008    unclear etiology baseline WBC  2.8-3.7  . Lung nodule  June 10, 2008    stable tiny noduke noted along the minor fissure of the right lung on CT angio September 11, 09 -  stable for 2 years and consistent with benign disease  . Pulmonary embolism [facility])  September 07, 2005    her CT angiogram - positive for pulmonary emboli to several branches of the right lower lobe- relatively small clot burden, clear lung; patient started on Coumadin; CT angiogram on January 10, 2006 showed resolution of previously seen pulmonary emboli with minimal basilar atelectasis  . Schizophrenia ([place])     Review of Systems:   Review of Systems  Constitutional: Negative for chills and fever.  Eyes: Positive for blurred vision and pain.  Cardiovascular: Negative for chest pain.  Skin: Positive for rash.     Physical Exam:  Vitals:   05/12/17 1337  BP: 118/83  Pulse: 71  Temp: 98.3 F (36.8 C)  TempSrc: Oral  SpO2: 100%  Weight: 205 lb 14.4 oz (93.4 kg)  Height: 5\' 5"  (1.651 m)    Constitutional: NAD, appears comfortable HEENT: PERRL, Tender submental and submandibular lymphadenopathy  Cardiovascular: RRR Pulmonary/Chest: CTAB Skin: Vesicular rash along the right V1 trigeminal  nerve distribution      Assessment & Plan:   See Encounters Tab for problem based charting.  Patient discussed with Dr. Angelia Mould

## 2017-05-12 NOTE — ED Notes (Signed)
Called for a room X2

## 2017-05-13 ENCOUNTER — Ambulatory Visit (INDEPENDENT_AMBULATORY_CARE_PROVIDER_SITE_OTHER): Payer: Medicaid Other | Admitting: Pharmacist

## 2017-05-13 DIAGNOSIS — Z7901 Long term (current) use of anticoagulants: Secondary | ICD-10-CM | POA: Diagnosis not present

## 2017-05-13 DIAGNOSIS — D6859 Other primary thrombophilia: Secondary | ICD-10-CM | POA: Diagnosis not present

## 2017-05-13 LAB — POCT INR: INR: 4

## 2017-05-13 LAB — HIV ANTIBODY (ROUTINE TESTING W REFLEX): HIV Screen 4th Generation wRfx: NONREACTIVE

## 2017-05-13 NOTE — Progress Notes (Signed)
Anticoagulation Management Michele Gillespie is a 41 y.o. female who reports to the clinic for monitoring of warfarin treatment.    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  Duration: indefinite Supervising physician: Joni Reining  Anticoagulation Clinic Visit History: Patient does not report signs/symptoms of bleeding or thromboembolism  Other recent changes: YES to  Medication changes--see patient findings section. No diet,, lifestyle changes endorsed by the patient other than as noted in patient findings section.  Anticoagulation Episode Summary    Current INR goal:   2.0-3.0  TTR:   46.6 % (4.9 y)  Next INR check:   05/19/2017  INR from last check:   4.0! (05/13/2017)  Weekly max warfarin dose:     Target end date:   Indefinite  IN[facility] check location:   [facility]  Preferred lab:     Send INR reminders to:      Indications   HYPERCOAGULABLE STATE PRIMARY [D68.59] Long term current use of anticoagulant [Z79.01]       Comments:           No Known Allergies Prior to Admission medications   Medication Sig Start Date End Date Taking? Authorizing Provider  acetaminophen (TYLENOL) 325 MG tablet Take 650 mg by mouth every 6 (six) hours as needed for mild pain.   Yes [provider]  albuterol (PROVENTIL HFA) 108 (90 Base) MCG/ACT inhaler INHALE 2 PUFFS INTO THE LUNGS EVERY 6 HOURS AS NEEDED FOR WHEEZING. FOR SHORTNESS OF BREATH Patient taking differently: Inhale 2 puffs into the lungs every 6 (six) hours as needed for shortness of breath.  12/11/16  Yes Ophelia Shoulder, MD  ARIPiprazole (ABILIFY) 10 MG tablet Take 1 tablet (10 mg total) by mouth daily. 09/09/16  Yes Norman Herrlich, MD  diclofenac sodium (VOLTAREN) 1 % GEL Apply 2 g topically 4 (four) times daily. 02/14/17  Yes Ophelia Shoulder, MD  HYDROcodone-acetaminophen (NORCO/VICODIN) 5-325 MG tablet Take 2 tablets by mouth every 4 (four) hours  as needed. 01/25/17  Yes Esaw Grandchild, MD  nicotine polacrilex (NICORETTE) 2 MG gum RX #3 Weeks 10-12: 1 piece every 4-8 hours. Max 24 pieces per day. 10/20/15  Yes Emokpae, Ejiroghene E, MD  olopatadine (PATANOL) 0.1 % ophthalmic solution Place 1 drop into both eyes 2 (two) times daily. 08/20/16 08/20/17 Yes Ophelia Shoulder, MD  omeprazole (PRILOSEC) 20 MG capsule Take 1 capsule (20 mg total) by mouth daily. 02/14/17  Yes Ophelia Shoulder, MD  ondansetron (ZOFRAN-ODT) 4 MG disintegrating tablet Take 1 tablet (4 mg total) by mouth every 8 (eight) hours as needed for nausea or vomiting. 12/16/16  Yes Norman Herrlich, MD  valACYclovir (VALTREX) 1000 MG tablet Take 1 tablet (1,000 mg total) by mouth 3 (three) times daily. 05/10/17 05/24/17 Yes Robyn Haber, MD  warfarin (COUMADIN) 5 MG tablet Take 3 tablets (15 mg total) by mouth daily at 6 PM. 03/11/17  Yes Bartholomew Crews, MD   Past Medical History:  Diagnosis Date  . Anemia 2007    microcytic anemia, baseline hemoglobin 10-11, MCV at baseline 72-77, secondary to iron deficiency  . Arm DVT (deep venous thromboembolism), acute [facility])  September 23, 2009, January 05, 2010    Doppler study significant with indeterminant age DVT involving the left upper extremity, Doppler performed January 05, 2010 consistent with acute DVT involving the left upper extremity  . Asthma   . Depression   . Leukopenia 2008  unclear etiology baseline WBC  2.8-3.7  . Lung nodule  June 10, 2008    stable tiny noduke noted along the minor fissure of the right lung on CT angio September 11, 09 -  stable for 2 years and consistent with benign disease  . Pulmonary embolism [facility])  September 07, 2005    her CT angiogram - positive for pulmonary emboli to several branches of the right lower lobe- relatively small clot burden, clear lung; patient started on Coumadin; CT angiogram on January 10, 2006 showed resolution of previously seen pulmonary emboli with minimal basilar atelectasis   . Schizophrenia [facility])    Social History   Social History  . Marital status: Married    Spouse name: N/A  . Number of children: N/A  . Years of education: N/A   Social History Main Topics  . Smoking status: Current Every Day Smoker    Packs/day: 0.25    Years: 0.00    Types: Cigarettes  . Smokeless tobacco: Never Used     Comment: ABOUT 6 CIGARETTES A DAY  . Alcohol use 0.0 oz/week  . Drug use: Yes    Types: Marijuana  . Sexual activity: Yes    Birth control/ protection: None     Comment: tubal   Other Topics Concern  . 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 Hypertension Mother   . Congestive Heart Failure Mother   . Diabetes Father   . Birth defects Maternal Aunt   . Birth defects Maternal Uncle   . Diabetes Paternal Grandmother   . Breast cancer Sister   . Breast cancer Sister     ASSESSMENT Recent Results: The most recent result is correlated with 100 mg per week: Lab Results  Component Value Date   INR 4.0 05/13/2017   INR 2.50 04/28/2017   INR 2.70 04/15/2017    Anticoagulation Dosing: INR as of 05/13/2017 and Previous Warfarin Dosing Information    INR Dt INR Goal Wkly Tot Sun Mon Tue Wed Thu Fri Sat   05/13/2017 4.0 2.0-3.0 100 mg 15 mg 15 mg 12.5 mg 15 mg 15 mg 12.5 mg 15 mg    Previous description   Take 2 & 1/2 tablets of your 5mg  peach-colored warfarin tablets on TUESDAYS and FRIDAYS; all other days, take THREE (3) tablets of your 5mg  peach colored warfarin tablets.    Anticoagulation Warfarin Dose Instructions as of 05/13/2017      Total Sun Mon Tue Wed Thu Fri Sat   New Dose 92.5 mg 12.5 mg 15 mg 12.5 mg 12.5 mg 12.5 mg 12.5 mg 15 mg     (5 mg x 2.5)  (5 mg x 3)  (5 mg x 2.5)  (5 mg x 2.5)  (5 mg x 2.5)  (5 mg x 2.5)  (5 mg x 3)                          Description   Take 2 & 1/2 tablets of your 5mg  peach-colored warfarin tablets on all days of week--EXCEPT on Mondays and Saturdays, take 3 tablets of your 5mg  peach-colored warfarin tablets on Mondays and Saturdays.      INR today: Supratherapeutic  PLAN Weekly dose  was decreased by 8% to 92.5 mg per week  Patient Instructions  Patient instructed to take medications as defined in the Anti-coagulation Track section of this encounter.  Patient instructed to OMIT today's dose.  Patient instructed to recommence warfarin on Wednesday May 14, 2017 by taking: 2 & 1/2 tablets of your 5mg  peach-colored warfarin tablets on all days of week--EXCEPT on Mondays and Saturdays, take 3 tablets of your 5mg  peach-colored warfarin tablets on Mondays and Saturdays.  Patient instructed to call the clinic or your OPTHALMALOGIS, Dr. Katy Fitch if the burning or stinging or itching around your eye gets worse or if any visual changes or disturbances occur. Patient instructed to keep her appointment with Dr. Katy Fitch scheduled for 26-AUG-18 or call office to seek earlier appointment if no improvement or worsening occurs. Patient verbalized understanding of these instructions.     Patient advised to contact clinic or seek medical attention if signs/symptoms of bleeding or thromboembolism occur.  Patient verbalized understanding by repeating back information and was advised to contact me if further medication-related questions arise. Patient was also provided an information handout.  Follow-up Return in 6 days (on 05/19/2017) for Follow up INR at 1000h.  Caryl Bis PharmD, CACP, CPP  15 minutes spent face-to-face with the patient during the encounter. 50% of time spent on education. 50% of time was spent on point of care fingerstick INR sample collection, resulting and interpretation and dose adjustment, documentation in EPIC/CHL and [email or web address]

## 2017-05-13 NOTE — Patient Instructions (Signed)
Patient instructed to take medications as defined in the Anti-coagulation Track section of this encounter.  Patient instructed to OMIT today's dose.  Patient instructed to recommence warfarin on Wednesday May 14, 2017 by taking: 2 & 1/2 tablets of your 5mg  peach-colored warfarin tablets on all days of week--EXCEPT on Mondays and Saturdays, take 3 tablets of your 5mg  peach-colored warfarin tablets on Mondays and Saturdays.  Patient instructed to call the clinic or your OPTHALMALOGIS, Dr. Katy Fitch if the burning or stinging or itching around your eye gets worse or if any visual changes or disturbances occur. Patient instructed to keep her appointment with Dr. Katy Fitch scheduled for 26-AUG-18 or call office to seek earlier appointment if no improvement or worsening occurs. Patient verbalized understanding of these instructions.

## 2017-05-14 NOTE — Progress Notes (Signed)
Internal Medicine Clinic Attending  Case discussed with Dr. Guilloud at the time of the visit.  We reviewed the resident's history and exam and pertinent patient test results.  I agree with the assessment, diagnosis, and plan of care documented in the resident's note.  

## 2017-05-14 NOTE — Progress Notes (Signed)
INTERNAL MEDICINE TEACHING ATTENDING ADDENDUM - Michele Groves, DO Duration- indefinate, Indication- recurrent VTE, INR-  Lab Results  Component Value Date   INR 4.0 05/13/2017  . Agree with pharmacy recommendations as outlined in their note discussed with Dr Elie Confer re Zoster infection.

## 2017-05-19 ENCOUNTER — Ambulatory Visit (INDEPENDENT_AMBULATORY_CARE_PROVIDER_SITE_OTHER): Payer: Medicaid Other | Admitting: Pharmacist

## 2017-05-19 DIAGNOSIS — Z7901 Long term (current) use of anticoagulants: Secondary | ICD-10-CM

## 2017-05-19 DIAGNOSIS — D6859 Other primary thrombophilia: Secondary | ICD-10-CM | POA: Diagnosis not present

## 2017-05-19 LAB — POCT INR: INR: 2.3

## 2017-05-19 MED ORDER — WARFARIN SODIUM 5 MG PO TABS: ORAL_TABLET | ORAL | 2 refills | Status: DC

## 2017-05-19 NOTE — Patient Instructions (Signed)
Patient instructed to take medications as defined in the Anti-coagulation Track section of this encounter.  Patient instructed to take today's dose.  Patient instructed to take  2 & 1/2 tablets of your 5mg  peach-colored warfarin tablets on all days of week--EXCEPT on Mondays and Saturdays, take 3 tablets of your 5mg  peach-colored warfarin tablets on Mondays and Saturdays.  Patient verbalized understanding of these instructions.

## 2017-05-19 NOTE — Addendum Note (Signed)
Addended by: [name] B on: 05/19/2017 05:07 PM   Modules accepted: Orders

## 2017-05-19 NOTE — Progress Notes (Signed)
Anticoagulation Management Michele Gillespie is a 41 y.o. female who reports to the clinic for monitoring of warfarin treatment.    Indication: Hypercoagulable state, primary, requiring long term anticoagulation with warfarin.  Duration: indefinite Supervising physician: Joni Reining  Anticoagulation Clinic Visit History: Patient does not report signs/symptoms of bleeding or thromboembolism  Other recent changes: No diet, medications, lifestyle changes endorsed to me by the patient.  Anticoagulation Episode Summary    Current INR goal:   2.0-3.0  TTR:   46.5 % (5 y)  Next INR check:   06/16/2017  INR from last check:   2.30 (05/19/2017)  Weekly max warfarin dose:     Target end date:   Indefinite  INR check location[facility]   [facility]  Preferred lab:     Send INR reminders to:      Indications   HYPERCOAGULABLE STATE PRIMARY [D68.59] Long term current use of anticoagulant [Z79.01]       Comments:           No Known Allergies Prior to Admission medications   Medication Sig Start Date End Date Taking? Authorizing Provider  acetaminophen (TYLENOL) 325 MG tablet Take 650 mg by mouth every 6 (six) hours as needed for mild pain.   Yes [provider]  albuterol (PROVENTIL HFA) 108 (90 Base) MCG/ACT inhaler INHALE 2 PUFFS INTO THE LUNGS EVERY 6 HOURS AS NEEDED FOR WHEEZING. FOR SHORTNESS OF BREATH Patient taking differently: Inhale 2 puffs into the lungs every 6 (six) hours as needed for shortness of breath.  12/11/16  Yes Ophelia Shoulder, MD  ARIPiprazole (ABILIFY) 10 MG tablet Take 1 tablet (10 mg total) by mouth daily. 09/09/16  Yes Norman Herrlich, MD  diclofenac sodium (VOLTAREN) 1 % GEL Apply 2 g topically 4 (four) times daily. 02/14/17  Yes Ophelia Shoulder, MD  HYDROcodone-acetaminophen (NORCO/VICODIN) 5-325 MG tablet Take 2 tablets by mouth every 4 (four) hours as needed. 01/25/17  Yes Esaw Grandchild, MD  nicotine polacrilex (NICORETTE) 2 MG gum RX #3 Weeks 10-12: 1 piece  every 4-8 hours. Max 24 pieces per day. 10/20/15  Yes Emokpae, Ejiroghene E, MD  olopatadine (PATANOL) 0.1 % ophthalmic solution Place 1 drop into both eyes 2 (two) times daily. 08/20/16 08/20/17 Yes Ophelia Shoulder, MD  omeprazole (PRILOSEC) 20 MG capsule Take 1 capsule (20 mg total) by mouth daily. 02/14/17  Yes Ophelia Shoulder, MD  ondansetron (ZOFRAN-ODT) 4 MG disintegrating tablet Take 1 tablet (4 mg total) by mouth every 8 (eight) hours as needed for nausea or vomiting. 12/16/16  Yes Norman Herrlich, MD  valACYclovir (VALTREX) 1000 MG tablet Take 1 tablet (1,000 mg total) by mouth 3 (three) times daily. 05/10/17 05/24/17 Yes Robyn Haber, MD  warfarin (COUMADIN) 5 MG tablet Take 3 tablets (15 mg total) by mouth daily at 6 PM. 03/11/17  Yes Bartholomew Crews, MD   Past Medical History:  Diagnosis Date  . Anemia 2007    microcytic anemia, baseline hemoglobin 10-11, MCV at baseline 72-77, secondary to iron deficiency  . Arm DVT (deep venous thromboembolism), acute [facility])  September 23, 2009, January 05, 2010    Doppler study significant with indeterminant age DVT involving the left upper extremity, Doppler performed January 05, 2010 consistent with acute DVT involving the left upper extremity  . Asthma   . Depression   . Leukopenia 2008    unclear etiology baseline WBC  2.8-3.7  . Lung nodule  June 10, 2008    stable tiny noduke  noted along the minor fissure of the right lung on CT angio September 11, 09 -  stable for 2 years and consistent with benign disease  . Pulmonary embolism [facility])  September 07, 2005    her CT angiogram - positive for pulmonary emboli to several branches of the right lower lobe- relatively small clot burden, clear lung; patient started on Coumadin; CT angiogram on January 10, 2006 showed resolution of previously seen pulmonary emboli with minimal basilar atelectasis  . Schizophrenia [facility])    Social History   Social History  . Marital status: Married    Spouse name: N/A  .  Number of children: N/A  . Years of education: N/A   Social History Main Topics  . Smoking status: Current Every Day Smoker    Packs/day: 0.25    Years: 0.00    Types: Cigarettes  . Smokeless tobacco: Never Used     Comment: ABOUT 6 CIGARETTES A DAY  . Alcohol use 0.0 oz/week  . Drug use: Yes    Types: Marijuana  . Sexual activity: Yes    Birth control/ protection: None     Comment: tubal   Other Topics Concern  . 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 Hypertension Mother   . Congestive Heart Failure Mother   . Diabetes Father   . Birth defects Maternal Aunt   . Birth defects Maternal Uncle   . Diabetes Paternal Grandmother   . Breast cancer Sister   . Breast cancer Sister     ASSESSMENT Recent Results: The most recent result is correlated with 92.5 mg per week: Lab Results  Component Value Date   INR 2.30 05/19/2017   INR 4.0 05/13/2017   INR 2.50 04/28/2017    Anticoagulation Dosing: INR as of 05/19/2017 and Previous Warfarin Dosing Information    INR Dt INR Goal Madilyn Fireman Sun Mon Tue Wed Thu Fri Sat   05/19/2017 2.30 2.0-3.0 92.5 mg 12.5 mg 15 mg 12.5 mg 12.5 mg 12.5 mg 12.5 mg 15 mg    Previous description   Take 2 & 1/2 tablets of your 5mg  peach-colored warfarin tablets on all days of week--EXCEPT on Mondays and Saturdays, take 3 tablets of your 5mg  peach-colored warfarin tablets on Mondays and Saturdays.    Anticoagulation Warfarin Dose Instructions as of 05/19/2017      Total Sun Mon Tue Wed Thu Fri Sat   New Dose 92.5 mg 12.5 mg 15 mg 12.5 mg 12.5 mg 12.5 mg 12.5 mg 15 mg     (5 mg x 2.5)  (5 mg x 3)  (5 mg x 2.5)  (5 mg x 2.5)  (5 mg x 2.5)  (5 mg x 2.5)  (5 mg x 3)                         Description   Take 2 & 1/2 tablets of your 5mg   peach-colored warfarin tablets on all days of week--EXCEPT on Mondays and Saturdays, take 3 tablets of your 5mg  peach-colored warfarin tablets on Mondays and Saturdays.      INR today: Therapeutic  PLAN Weekly dose was unchanged .  Patient Instructions  Patient instructed to take medications as defined in the  Anti-coagulation Track section of this encounter.  Patient instructed to take today's dose.  Patient instructed to take  2 & 1/2 tablets of your 5mg  peach-colored warfarin tablets on all days of week--EXCEPT on Mondays and Saturdays, take 3 tablets of your 5mg  peach-colored warfarin tablets on Mondays and Saturdays.  Patient verbalized understanding of these instructions.     Patient advised to contact clinic or seek medical attention if signs/symptoms of bleeding or thromboembolism occur.  Patient verbalized understanding by repeating back information and was advised to contact me if further medication-related questions arise. Patient was also provided an information handout.  Follow-up Return in 4 weeks (on 06/16/2017) for Follow up INR at 3:30PM.  Ceasar Lund, Eustaquio Boyden PharmD, CACP, CPP  15 minutes spent face-to-face with the patient during the encounter. 50% of time spent on education. 50% of time was spent on fingerstick point of care INR sample collection, processing, results interpretation and documentation in EPIC/CHL and [email or web address]

## 2017-05-26 ENCOUNTER — Ambulatory Visit: Payer: Medicaid Other

## 2017-05-26 NOTE — Progress Notes (Signed)
INTERNAL MEDICINE TEACHING ATTENDING ADDENDUM - Lucious Groves, DO Duration- indefinate, Indication- recurrent VTE, INR-  Lab Results  Component Value Date   INR 2.30 05/19/2017  . Agree with pharmacy recommendations as outlined in their note.

## 2017-05-29 ENCOUNTER — Other Ambulatory Visit: Payer: Self-pay

## 2017-05-29 MED ORDER — OLOPATADINE HCL 0.1 % OP SOLN: 1.0000 [drp] | Freq: Two times a day (BID) | OPHTHALMIC | 1 refills | Status: DC

## 2017-05-29 NOTE — Telephone Encounter (Signed)
olopatadine (PATANOL) 0.1 % ophthalmic solution, Refill request @ [facility] on [address]. Per patient the pharmacy is still waiting for the office to reply back.

## 2017-06-16 ENCOUNTER — Other Ambulatory Visit: Payer: Self-pay | Admitting: Internal Medicine

## 2017-06-16 ENCOUNTER — Ambulatory Visit (INDEPENDENT_AMBULATORY_CARE_PROVIDER_SITE_OTHER): Payer: Medicaid Other | Admitting: Pharmacist

## 2017-06-16 DIAGNOSIS — D6859 Other primary thrombophilia: Secondary | ICD-10-CM | POA: Diagnosis not present

## 2017-06-16 DIAGNOSIS — Z7901 Long term (current) use of anticoagulants: Secondary | ICD-10-CM | POA: Diagnosis not present

## 2017-06-16 DIAGNOSIS — Z1231 Encounter for screening mammogram for malignant neoplasm of breast: Secondary | ICD-10-CM

## 2017-06-16 LAB — POCT INR: INR: 1.7

## 2017-06-16 NOTE — Progress Notes (Signed)
Anticoagulation Management Michele Gillespie is a 41 y.o. female who reports to the clinic for monitoring of warfarin treatment.    Indication: Primary hypercoagulable state [D68.59], long-term current use of anticoagulant [Z79.01]. Duration: indefinite Supervising physician: Joni Reining  Anticoagulation Clinic Visit History: Patient does not report signs/symptoms of bleeding or thromboembolism  Other recent changes: No diet, medications, lifestyle changes endorsed by the patient to me.  Anticoagulation Episode Summary    Current INR goal:   2.0-3.0  TTR:   46.6 % (5 y)  Next INR check:   07/07/2017  INR from last check:   1.70! (06/16/2017)  Weekly max warfarin dose:     Target end date:   Indefinite  INR check location:   [facility]  Preferred lab:     Send INR reminders to:      Indications   HYPERCOAGULABLE STATE PRIMARY [D68.59] Long term current use of anticoagulant [Z79.01]       Comments:           No Known Allergies Prior to Admission medications   Medication Sig Start Date End Date Taking? Authorizing Provider  acetaminophen (TYLENOL) 325 MG tablet Take 650 mg by mouth every 6 (six) hours as needed for mild pain.   Yes [provider]  albuterol (PROVENTIL HFA) 108 (90 Base) MCG/ACT inhaler INHALE 2 PUFFS INTO THE LUNGS EVERY 6 HOURS AS NEEDED FOR WHEEZING. FOR SHORTNESS OF BREATH Patient taking differently: Inhale 2 puffs into the lungs every 6 (six) hours as needed for shortness of breath.  12/11/16  Yes Ophelia Shoulder, MD  ARIPiprazole (ABILIFY) 10 MG tablet Take 1 tablet (10 mg total) by mouth daily. 09/09/16  Yes Norman Herrlich, MD  diclofenac sodium (VOLTAREN) 1 % GEL Apply 2 g topically 4 (four) times daily. 02/14/17  Yes Ophelia Shoulder, MD  HYDROcodone-acetaminophen (NORCO/VICODIN) 5-325 MG tablet Take 2 tablets by mouth every 4 (four) hours as needed. 01/25/17  Yes Esaw Grandchild, MD  nicotine polacrilex (NICORETTE) 2 MG gum RX #3 Weeks 10-12: 1  piece every 4-8 hours. Max 24 pieces per day. 10/20/15  Yes Emokpae, Ejiroghene E, MD  olopatadine (PATANOL) 0.1 % ophthalmic solution Place 1 drop into both eyes 2 (two) times daily. 05/29/17 05/29/18 Yes Aldine Contes, MD  omeprazole (PRILOSEC) 20 MG capsule Take 1 capsule (20 mg total) by mouth daily. 02/14/17  Yes Ophelia Shoulder, MD  ondansetron (ZOFRAN-ODT) 4 MG disintegrating tablet Take 1 tablet (4 mg total) by mouth every 8 (eight) hours as needed for nausea or vomiting. 12/16/16  Yes Norman Herrlich, MD  warfarin (COUMADIN) 5 MG tablet Take 3 tablets on Mondays and Fridays; all other days--take 2&1/2 tablets. 05/19/17  Yes Lucious Groves, DO   Past Medical History:  Diagnosis Date  . Anemia 2007    microcytic anemia, baseline hemoglobin 10-11, MCV at baseline 72-77, secondary to iron deficiency  . Arm DVT (deep venous thromboembolism), acute [facility])  September 23, 2009, January 05, 2010    Doppler study significant with indeterminant age DVT involving the left upper extremity, Doppler performed January 05, 2010 consistent with acute DVT involving the left upper extremity  . Asthma   . Depression   . Leukopenia 2008    unclear etiology baseline WBC  2.8-3.7  . Lung nodule  June 10, 2008    stable tiny noduke noted along the minor fissure of the right lung on CT angio September 11, 09 -  stable for 2 years and consistent with  benign disease  . Pulmonary embolism [facility])  September 07, 2005    her CT angiogram - positive for pulmonary emboli to several branches of the right lower lobe- relatively small clot burden, clear lung; patient started on Coumadin; CT angiogram on January 10, 2006 showed resolution of previously seen pulmonary emboli with minimal basilar atelectasis  . Schizophrenia [facility])    Social History   Social History  . Marital status: Married    Spouse name: N/A  . Number of children: N/A  . Years of education: N/A   Social History Main Topics  . Smoking status: Current Every  Day Smoker    Packs/day: 0.25    Years: 0.00    Types: Cigarettes  . Smokeless tobacco: Never Used     Comment: ABOUT 6 CIGARETTES A DAY  . Alcohol use 0.0 oz/week  . Drug use: Yes    Types: Marijuana  . Sexual activity: Yes    Birth control/ protection: None     Comment: tubal   Other Topics Concern  . 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 Hypertension Mother   . Congestive Heart Failure Mother   . Diabetes Father   . Birth defects Maternal Aunt   . Birth defects Maternal Uncle   . Diabetes Paternal Grandmother   . Breast cancer Sister   . Breast cancer Sister     ASSESSMENT Recent Results: The most recent result is correlated with 92.5 mg per week: Lab Results  Component Value Date   INR 1.70 06/16/2017   INR 2.30 05/19/2017   INR 4.0 05/13/2017    Anticoagulation Dosing: INR as of 06/16/2017 and Previous Warfarin Dosing Information    INR Dt INR Goal [employer] Sun Mon Tue Wed Thu Fri Sat   06/16/2017 1.70 2.0-3.0 92.5 mg 12.5 mg 15 mg 12.5 mg 12.5 mg 12.5 mg 12.5 mg 15 mg    Previous description   Take 2 & 1/2 tablets of your 5mg  peach-colored warfarin tablets on all days of week--EXCEPT on Mondays and Saturdays, take 3 tablets of your 5mg  peach-colored warfarin tablets on Mondays and Saturdays.    Anticoagulation Warfarin Dose Instructions as of 06/16/2017      Total Sun Mon Tue Wed Thu Fri Sat   New Dose 100 mg 12.5 mg 15 mg 15 mg 15 mg 15 mg 12.5 mg 15 mg     (5 mg x 2.5)  (5 mg x 3)  (5 mg x 3)  (5 mg x 3)  (5 mg x 3)  (5 mg x 2.5)  (5 mg x 3)                         Description   Take 2 & 1/2 tablets of your 5mg  peach-colored warfarin tablets on all days of week--EXCEPT on Sundays and Fridays--take only 2 & 1/2 tablets of your peach-colored  warfarin tablets on Sundays and Fridays.      INR today: Subtherapeutic  PLAN Weekly dose was increased by 7% to 100 mg per week  Patient Instructions  Patient instructed to take medications as defined in the Anti-coagulation Track section of this encounter.  Patient instructed to take today's dose.  Patient instructed to  take 2 & 1/2 tablets of your 5mg  peach-colored warfarin tablets on all days of week--EXCEPT on Sundays and Fridays--take only 2 & 1/2 tablets of your peach-colored warfarin tablets on Sundays and Fridays. Patient verbalized understanding of these instructions.     Patient advised to contact clinic or seek medical attention if signs/symptoms of bleeding or thromboembolism occur.  Patient verbalized understanding by repeating back information and was advised to contact me if further medication-related questions arise. Patient was also provided an information handout.  Follow-up Return in 3 weeks (on 07/07/2017) for Follow up INR at [place], PharmD, CACP, CPP  15 minutes spent face-to-face with the patient during the encounter. 50% of time spent on education. 50% of time was spent on fingerstick point of care INR sample collection, processing, results determination, interpretation, dose adjustment and documentation in [email or web address].

## 2017-06-16 NOTE — Patient Instructions (Signed)
Patient instructed to take medications as defined in the Anti-coagulation Track section of this encounter.  Patient instructed to take today's dose.  Patient instructed to take 2 & 1/2 tablets of your 5mg  peach-colored warfarin tablets on all days of week--EXCEPT on Sundays and Fridays--take only 2 & 1/2 tablets of your peach-colored warfarin tablets on Sundays and Fridays. Patient verbalized understanding of these instructions.

## 2017-06-17 NOTE — Progress Notes (Signed)
INTERNAL MEDICINE TEACHING ATTENDING ADDENDUM - Lucious Groves, DO Duration- indefinate, Indication- hypercoag state, INR-  Lab Results  Component Value Date   INR 1.70 06/16/2017  . Agree with pharmacy recommendations as outlined in their note.

## 2017-06-25 ENCOUNTER — Ambulatory Visit
Admission: RE | Admit: 2017-06-25 | Discharge: 2017-06-25 | Disposition: A | Discharge status: Home / Self Care | Payer: Medicaid Other | Source: Ambulatory Visit | Attending: Internal Medicine | Admitting: Internal Medicine

## 2017-06-25 DIAGNOSIS — Z1231 Encounter for screening mammogram for malignant neoplasm of breast: Secondary | ICD-10-CM

## 2017-07-07 ENCOUNTER — Ambulatory Visit (INDEPENDENT_AMBULATORY_CARE_PROVIDER_SITE_OTHER): Payer: Medicaid Other | Admitting: Pharmacist

## 2017-07-07 DIAGNOSIS — Z7901 Long term (current) use of anticoagulants: Secondary | ICD-10-CM

## 2017-07-07 DIAGNOSIS — D6859 Other primary thrombophilia: Secondary | ICD-10-CM | POA: Diagnosis not present

## 2017-07-07 LAB — POCT INR: INR: 3

## 2017-07-07 NOTE — Patient Instructions (Signed)
Patient instructed to take medications as defined in the Anti-coagulation Track section of this encounter.  Patient instructed to take today's dose.  Patient instructed to take 2 & 1/2 tablets of your 5mg  peach-colored warfarin tablets on all days of week--EXCEPT on Sundays, Wednesdays and Fridays--take only 2 & 1/2 tablets of your peach-colored warfarin tablets on Sundays, Wednesdays and Fridays.  Patient verbalized understanding of these instructions.

## 2017-07-07 NOTE — Progress Notes (Signed)
Anticoagulation Management Michele Gillespie is a 41 y.o. female who reports to the clinic for monitoring of warfarin treatment.    Indication: Hypercoagulable state, primary [D68.59], Long term use of anticoagulant [Z79.01].  Duration: indefinite Supervising physician: [place] [facility] Visit History: Patient does not report signs/symptoms of bleeding or thromboembolism  Other recent changes: No diet, medications, lifestyle changes endorsed by the patient to me.  Anticoagulation Episode Summary    Current INR goal:   2.0-3.0  TTR:   46.9 % (5.1 y)  Next INR check:   08/04/2017  INR from last check:   3.00 (07/07/2017)  Weekly max warfarin dose:     Target end date:   Indefinite  INR check location[facility]   [facility]  Preferred lab:     Send INR reminders to:      Indications   HYPERCOAGULABLE STATE PRIMARY [D68.59] Long term current use of anticoagulant [Z79.01]       Comments:           No Known Allergies Prior to Admission medications   Medication Sig Start Date End Date Taking? Authorizing Provider  acetaminophen (TYLENOL) 325 MG tablet Take 650 mg by mouth every 6 (six) hours as needed for mild pain.   Yes [provider]  albuterol (PROVENTIL HFA) 108 (90 Base) MCG/ACT inhaler INHALE 2 PUFFS INTO THE LUNGS EVERY 6 HOURS AS NEEDED FOR WHEEZING. FOR SHORTNESS OF BREATH Patient taking differently: Inhale 2 puffs into the lungs every 6 (six) hours as needed for shortness of breath.  12/11/16  Yes Ophelia Shoulder, MD  ARIPiprazole (ABILIFY) 10 MG tablet Take 1 tablet (10 mg total) by mouth daily. 09/09/16  Yes Norman Herrlich, MD  diclofenac sodium (VOLTAREN) 1 % GEL Apply 2 g topically 4 (four) times daily. 02/14/17  Yes Ophelia Shoulder, MD  HYDROcodone-acetaminophen (NORCO/VICODIN) 5-325 MG tablet Take 2 tablets by mouth every 4 (four) hours as needed. 01/25/17  Yes Esaw Grandchild, MD  nicotine polacrilex (NICORETTE) 2 MG gum RX #3 Weeks 10-12: 1  piece every 4-8 hours. Max 24 pieces per day. 10/20/15  Yes Emokpae, Ejiroghene E, MD  olopatadine (PATANOL) 0.1 % ophthalmic solution Place 1 drop into both eyes 2 (two) times daily. 05/29/17 05/29/18 Yes Aldine Contes, MD  omeprazole (PRILOSEC) 20 MG capsule Take 1 capsule (20 mg total) by mouth daily. 02/14/17  Yes Ophelia Shoulder, MD  ondansetron (ZOFRAN-ODT) 4 MG disintegrating tablet Take 1 tablet (4 mg total) by mouth every 8 (eight) hours as needed for nausea or vomiting. 12/16/16  Yes Norman Herrlich, MD  warfarin (COUMADIN) 5 MG tablet Take 3 tablets on Mondays and Fridays; all other days--take 2&1/2 tablets. 05/19/17  Yes Lucious Groves, DO   Past Medical History:  Diagnosis Date  . Anemia 2007    microcytic anemia, baseline hemoglobin 10-11, MCV at baseline 72-77, secondary to iron deficiency  . Arm DVT (deep venous thromboembolism), acute [facility])  September 23, 2009, January 05, 2010    Doppler study significant with indeterminant age DVT involving the left upper extremity, Doppler performed January 05, 2010 consistent with acute DVT involving the left upper extremity  . Asthma   . Depression   . Leukopenia 2008    unclear etiology baseline WBC  2.8-3.7  . Lung nodule  June 10, 2008    stable tiny noduke noted along the minor fissure of the right lung on CT angio September 11, 09 -  stable for 2 years and consistent  with benign disease  . Pulmonary embolism [facility])  September 07, 2005    her CT angiogram - positive for pulmonary emboli to several branches of the right lower lobe- relatively small clot burden, clear lung; patient started on Coumadin; CT angiogram on January 10, 2006 showed resolution of previously seen pulmonary emboli with minimal basilar atelectasis  . Schizophrenia [facility])    Social History   Social History  . Marital status: Married    Spouse name: N/A  . Number of children: N/A  . Years of education: N/A   Social History Main Topics  . Smoking status: Current Every  Day Smoker    Packs/day: 0.25    Years: 0.00    Types: Cigarettes  . Smokeless tobacco: Never Used     Comment: ABOUT 6 CIGARETTES A DAY  . Alcohol use 0.0 oz/week  . Drug use: Yes    Types: Marijuana  . Sexual activity: Yes    Birth control/ protection: None     Comment: tubal   Other Topics Concern  . 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 Hypertension Mother   . Congestive Heart Failure Mother   . Diabetes Father   . Birth defects Maternal Aunt   . Birth defects Maternal Uncle   . Diabetes Paternal Grandmother   . Breast cancer Sister   . Breast cancer Sister     ASSESSMENT Recent Results: The most recent result is correlated with 100 mg per week: Lab Results  Component Value Date   INR 3.00 07/07/2017   INR 1.70 06/16/2017   INR 2.30 05/19/2017    Anticoagulation Dosing: INR as of 07/07/2017 and Previous Warfarin Dosing Information    INR Dt INR Goal [employer] Sun Mon Tue Wed Thu Fri Sat   07/07/2017 3.00 2.0-3.0 100 mg 12.5 mg 15 mg 15 mg 15 mg 15 mg 12.5 mg 15 mg    Previous description   Take 2 & 1/2 tablets of your 5mg  peach-colored warfarin tablets on all days of week--EXCEPT on Sundays and Fridays--take only 2 & 1/2 tablets of your peach-colored warfarin tablets on Sundays and Fridays.    Anticoagulation Warfarin Dose Instructions as of 07/07/2017      Total Sun Mon Tue Wed Thu Fri Sat   New Dose 97.5 mg 12.5 mg 15 mg 15 mg 12.5 mg 15 mg 12.5 mg 15 mg     (5 mg x 2.5)  (5 mg x 3)  (5 mg x 3)  (5 mg x 2.5)  (5 mg x 3)  (5 mg x 2.5)  (5 mg x 3)                         Description   Take 2 & 1/2 tablets of your 5mg  peach-colored warfarin tablets on all days of week--EXCEPT on Sundays, Wednesdays and Fridays--take only 2 & 1/2 tablets of your  peach-colored warfarin tablets on Sundays, Wednesdays and Fridays.      INR today: Therapeutic  PLAN Weekly dose was decreased by 3% to 97.5 mg per week  Patient Instructions  Patient instructed to take medications as defined in the Anti-coagulation Track section of this encounter.  Patient instructed to take today's dose.  Patient instructed to take 2 & 1/2 tablets of your 5mg  peach-colored warfarin tablets on all days of week--EXCEPT on Sundays, Wednesdays and Fridays--take only 2 & 1/2 tablets of your peach-colored warfarin tablets on Sundays, Wednesdays and Fridays.  Patient verbalized understanding of these instructions.     Patient advised to contact clinic or seek medical attention if signs/symptoms of bleeding or thromboembolism occur.  Patient verbalized understanding by repeating back information and was advised to contact me if further medication-related questions arise. Patient was also provided an information handout.  Follow-up Return in 4 weeks (on 08/04/2017) for Follow up INR at 2:45PM.  Pennie Banter, PharmD, CACP, CPP  15 minutes spent face-to-face with the patient during the encounter. 50% of time spent on education. 50% of time was spent on fingerstick point of care INR sample collection, processing, results determination, dose adjustment, documentation in EPIC/CHL and [email or web address]

## 2017-08-04 ENCOUNTER — Ambulatory Visit (INDEPENDENT_AMBULATORY_CARE_PROVIDER_SITE_OTHER): Payer: Medicaid Other | Admitting: Pharmacist

## 2017-08-04 DIAGNOSIS — D6859 Other primary thrombophilia: Secondary | ICD-10-CM | POA: Diagnosis not present

## 2017-08-04 DIAGNOSIS — Z7901 Long term (current) use of anticoagulants: Secondary | ICD-10-CM | POA: Diagnosis not present

## 2017-08-04 DIAGNOSIS — I82622 Acute embolism and thrombosis of deep veins of left upper extremity: Secondary | ICD-10-CM | POA: Diagnosis present

## 2017-08-04 LAB — PROTIME-INR
INR: 2.2
PROTHROMBIN TIME: 24.3 s — AB (ref 11.4–15.2)

## 2017-08-04 NOTE — Progress Notes (Signed)
Anticoagulation Management Michele Gillespie is a 41 y.o. female who reports to the clinic for monitoring of warfarin treatment.    Indication: Hypercoagulable state with history of VTE on long term use of oral anticoagulants. Duration: indefinite Supervising physician: Gilles Chiquito  Anticoagulation Clinic Visit History: Patient does not report signs/symptoms of bleeding or thromboembolism  Other recent changes: No diet, medications, lifestyle changes endorsed by the patient to me.  Anticoagulation Episode Summary    Current INR goal:   2.0-3.0  TTR:   47.7 % (5.2 y)  Next INR check:   08/25/2017  INR from last check:   2.20 (08/04/2017)  Weekly max warfarin dose:     Target end date:   Indefinite  INR check location[facility]   [facility]  Preferred lab:     Send INR reminders to:      Indications   HYPERCOAGULABLE STATE PRIMARY [D68.59] Long term current use of anticoagulant [Z79.01]       Comments:           No Known Allergies Prior to Admission medications   Medication Sig Start Date End Date Taking? Authorizing Provider  acetaminophen (TYLENOL) 325 MG tablet Take 650 mg by mouth every 6 (six) hours as needed for mild pain.   Yes [provider]  albuterol (PROVENTIL HFA) 108 (90 Base) MCG/ACT inhaler INHALE 2 PUFFS INTO THE LUNGS EVERY 6 HOURS AS NEEDED FOR WHEEZING. FOR SHORTNESS OF BREATH Patient taking differently: Inhale 2 puffs into the lungs every 6 (six) hours as needed for shortness of breath.  12/11/16  Yes Ophelia Shoulder, MD  ARIPiprazole (ABILIFY) 10 MG tablet Take 1 tablet (10 mg total) by mouth daily. 09/09/16  Yes Norman Herrlich, MD  diclofenac sodium (VOLTAREN) 1 % GEL Apply 2 g topically 4 (four) times daily. 02/14/17  Yes Ophelia Shoulder, MD  HYDROcodone-acetaminophen (NORCO/VICODIN) 5-325 MG tablet Take 2 tablets by mouth every 4 (four) hours as needed. 01/25/17  Yes Esaw Grandchild, MD  nicotine polacrilex (NICORETTE) 2 MG gum RX #3 Weeks 10-12: 1 piece  every 4-8 hours. Max 24 pieces per day. 10/20/15  Yes Emokpae, Ejiroghene E, MD  olopatadine (PATANOL) 0.1 % ophthalmic solution Place 1 drop into both eyes 2 (two) times daily. 05/29/17 05/29/18 Yes Aldine Contes, MD  omeprazole (PRILOSEC) 20 MG capsule Take 1 capsule (20 mg total) by mouth daily. 02/14/17  Yes Ophelia Shoulder, MD  ondansetron (ZOFRAN-ODT) 4 MG disintegrating tablet Take 1 tablet (4 mg total) by mouth every 8 (eight) hours as needed for nausea or vomiting. 12/16/16  Yes Norman Herrlich, MD  warfarin (COUMADIN) 5 MG tablet Take 3 tablets on Mondays and Fridays; all other days--take 2&1/2 tablets. 05/19/17  Yes Lucious Groves, DO   Past Medical History:  Diagnosis Date  . Anemia 2007    microcytic anemia, baseline hemoglobin 10-11, MCV at baseline 72-77, secondary to iron deficiency  . Arm DVT (deep venous thromboembolism), acute [facility])  September 23, 2009, January 05, 2010    Doppler study significant with indeterminant age DVT involving the left upper extremity, Doppler performed January 05, 2010 consistent with acute DVT involving the left upper extremity  . Asthma   . Depression   . Leukopenia 2008    unclear etiology baseline WBC  2.8-3.7  . Lung nodule  June 10, 2008    stable tiny noduke noted along the minor fissure of the right lung on CT angio September 11, 09 -  stable for 2 years  and consistent with benign disease  . Pulmonary embolism [facility])  September 07, 2005    her CT angiogram - positive for pulmonary emboli to several branches of the right lower lobe- relatively small clot burden, clear lung; patient started on Coumadin; CT angiogram on January 10, 2006 showed resolution of previously seen pulmonary emboli with minimal basilar atelectasis  . Schizophrenia [facility])    Social History   Socioeconomic History  . Marital status: Married    Spouse name: Not on file  . Number of children: Not on file  . Years of education: Not on file  . Highest education level: Not on file   Social Needs  . Financial resource strain: Not on file  . Food insecurity - worry: Not on file  . Food insecurity - inability: Not on file  . Transportation needs - medical: Not on file  . Transportation needs - non-medical: Not on file  Occupational History  . Not on file  Tobacco Use  . Smoking status: Current Every Day Smoker    Packs/day: 0.25    Years: 0.00    Pack years: 0.00    Types: Cigarettes  . Smokeless tobacco: Never Used  . Tobacco comment: ABOUT 6 CIGARETTES A DAY  Substance and Sexual Activity  . Alcohol use: Yes    Alcohol/week: 0.0 oz  . Drug use: Yes    Types: Marijuana  . Sexual activity: Yes    Birth control/protection: None    Comment: tubal  Other Topics Concern  . 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 Hypertension Mother   . Congestive Heart Failure Mother   . Diabetes Father   . Birth defects Maternal Aunt   . Birth defects Maternal Uncle   . Diabetes Paternal Grandmother   . Breast cancer Sister   . Breast cancer Sister     ASSESSMENT Recent Results: The most recent result is correlated with 97.5 mg per week: Lab Results  Component Value Date   INR 2.20 08/04/2017   INR 3.00 07/07/2017   INR 1.70 06/16/2017    Anticoagulation Dosing: Description   Take 2 & 1/2 tablets of your 5mg  peach-colored warfarin tablets on Mondays, Wednesdays and Fridays. All other days of the week--take three (3) tablets of your 5mg  peach-colored warfarin tablets by mouth once-daily at 6PM.     INR today: Therapeutic  PLAN Weekly dose was unchanged.  Patient Instructions  Patient instructed to take medications as defined in the Anti-coagulation Track section of this encounter.  Patient instructed to take today's dose.  Patient instructed to take  2 & 1/2 tablets of your 5mg  peach-colored warfarin tablets on Mondays, Wednesdays and Fridays. All other days of the week--take three (3) tablets of your 5mg  peach-colored warfarin tablets by mouth once-daily at [facility]. Patient verbalized understanding of these instructions.     Patient advised to contact clinic or seek medical attention if signs/symptoms of bleeding or thromboembolism occur.  Patient verbalized understanding by repeating back information and was advised to contact me if further medication-related questions arise. Patient was also provided an information handout.  Follow-up Return in 3 weeks (on 08/25/2017) for Follow up INR at 1100h.  [name] Beretta Ginsberg  15 minutes spent face-to-face with the patient during the encounter.  50% of time spent on education. 50% of time was spent on venipuncture and documentation.

## 2017-08-04 NOTE — Patient Instructions (Signed)
Patient instructed to take medications as defined in the Anti-coagulation Track section of this encounter.  Patient instructed to take today's dose.  Patient instructed to take 2 & 1/2 tablets of your 5mg  peach-colored warfarin tablets on Mondays, Wednesdays and Fridays. All other days of the week--take three (3) tablets of your 5mg  peach-colored warfarin tablets by mouth once-daily at [facility]. Patient verbalized understanding of these instructions.

## 2017-08-06 ENCOUNTER — Encounter (HOSPITAL_COMMUNITY): Payer: Self-pay | Admitting: *Deleted

## 2017-08-06 ENCOUNTER — Other Ambulatory Visit: Payer: Self-pay

## 2017-08-06 ENCOUNTER — Emergency Department (HOSPITAL_COMMUNITY)
Admission: EM | Admit: 2017-08-06 | Discharge: 2017-08-06 | Disposition: A | Discharge status: Home / Self Care | Payer: Medicaid Other | Attending: Emergency Medicine | Admitting: Emergency Medicine

## 2017-08-06 DIAGNOSIS — Z79899 Other long term (current) drug therapy: Secondary | ICD-10-CM | POA: Diagnosis not present

## 2017-08-06 DIAGNOSIS — M79604 Pain in right leg: Secondary | ICD-10-CM

## 2017-08-06 DIAGNOSIS — F17210 Nicotine dependence, cigarettes, uncomplicated: Secondary | ICD-10-CM | POA: Diagnosis not present

## 2017-08-06 DIAGNOSIS — M25551 Pain in right hip: Secondary | ICD-10-CM | POA: Diagnosis present

## 2017-08-06 MED ORDER — OXYCODONE-ACETAMINOPHEN 5-325 MG PO TABS
ORAL_TABLET | ORAL | Status: AC
  Filled 2017-08-06: qty 1

## 2017-08-06 MED ORDER — OXYCODONE-ACETAMINOPHEN 5-325 MG PO TABS
1.0000 | ORAL_TABLET | ORAL | Status: DC | PRN
  Administered 2017-08-06: 1 via ORAL

## 2017-08-06 NOTE — ED Notes (Signed)
ED Provider at bedside. 

## 2017-08-06 NOTE — ED Provider Notes (Signed)
[place] EMERGENCY DEPARTMENT Provider Note   CSN: [id number] Arrival date & time: 08/06/17  1149     History   Chief Complaint Chief Complaint  Patient presents with  . Leg Pain    HPI Michele Gillespie is a 41 y.o. female.  HPI   41 year old female presents today with complaints of leg pain.  Patient reports approximately 2 weeks of severe right-sided leg pain.  She reports this is sharp and stabbing.  This originates in her right posterior hip and buttocks.  She notes this radiates down into her calf.  She denies any loss of distal sensation strength and motor function.  She denies any warmth, swelling or edema.  Patient denies any history of the same.  She denies any trauma.  Patient does report history of DVT, PE, reporting that she is on Coumadin, was seen at the Coumadin clinic yesterday with an INR of 2.2.  Patient notes that she did not mention her leg pain to her primary care provider at yesterday's visit.  Patient notes she took a Ambulance person today.     Past Medical History:  Diagnosis Date  . Anemia 2007    microcytic anemia, baseline hemoglobin 10-11, MCV at baseline 72-77, secondary to iron deficiency  . Arm DVT (deep venous thromboembolism), acute [facility])  September 23, 2009, January 05, 2010    Doppler study significant with indeterminant age DVT involving the left upper extremity, Doppler performed January 05, 2010 consistent with acute DVT involving the left upper extremity  . Asthma   . Depression   . Leukopenia 2008    unclear etiology baseline WBC  2.8-3.7  . Lung nodule  June 10, 2008    stable tiny noduke noted along the minor fissure of the right lung on CT angio September 11, 09 -  stable for 2 years and consistent with benign disease  . Pulmonary embolism [facility])  September 07, 2005    her CT angiogram - positive for pulmonary emboli to several branches of the right lower lobe- relatively small clot burden, clear lung; patient started on  Coumadin; CT angiogram on January 10, 2006 showed resolution of previously seen pulmonary emboli with minimal basilar atelectasis  . Schizophrenia [facility])     Patient Active Problem List   Diagnosis Date Noted  . Herpes zoster 05/12/2017  . Acute deep vein thrombosis (DVT) of brachial vein of left upper extremity ([place]) 12/16/2016  . Depression 06/20/2016  . Preventative health care 06/20/2016  . Pelvic mass in female 06/20/2016  . Chest pain 02/15/2016  . Secondary amenorrhea 09/14/2014  . Allergic rhinitis with Asthma. 09/14/2014  . GERD (gastroesophageal reflux disease) 09/14/2013  . Breast mass, right 09/14/2013  . Healthcare maintenance 09/14/2013  . Cervical mass 02/08/2013  . H/O cesarean section 12/21/2012  . Tobacco use disorder 06/22/2012  . Long term current use of anticoagulant 10/20/2010  . DEPRESSION, HX OF 05/16/2010  . Iron deficiency anemia 10/12/2006  . LEUKOCYTOPENIA NOS 10/12/2006  . HYPERCOAGULABLE STATE, PRIMARY 10/12/2006    Past Surgical History:  Procedure Laterality Date  . CESAREAN SECTION      History of 5 C-section  . EXPLORATORY LAPAROTOMY WITH ABDOMINAL MASS EXCISION  02/2005  . TUBAL LIGATION      OB History    Gravida Para Term Preterm AB Living   5 4 4  0 0 5   SAB TAB Ectopic Multiple Live Births   0 0 0 0  Home Medications    Prior to Admission medications   Medication Sig Start Date End Date Taking? Authorizing Provider  acetaminophen (TYLENOL) 325 MG tablet Take 650 mg by mouth every 6 (six) hours as needed for mild pain.    [provider]  albuterol (PROVENTIL HFA) 108 (90 Base) MCG/ACT inhaler INHALE 2 PUFFS INTO THE LUNGS EVERY 6 HOURS AS NEEDED FOR WHEEZING. FOR SHORTNESS OF BREATH Patient taking differently: Inhale 2 puffs into the lungs every 6 (six) hours as needed for shortness of breath.  12/11/16   Ophelia Shoulder, MD  ARIPiprazole (ABILIFY) 10 MG tablet Take 1 tablet (10 mg total) by mouth daily. 09/09/16    Norman Herrlich, MD  diclofenac sodium (VOLTAREN) 1 % GEL Apply 2 g topically 4 (four) times daily. 02/14/17   Ophelia Shoulder, MD  HYDROcodone-acetaminophen (NORCO/VICODIN) 5-325 MG tablet Take 2 tablets by mouth every 4 (four) hours as needed. 01/25/17   Esaw Grandchild, MD  nicotine polacrilex (NICORETTE) 2 MG gum RX #3 Weeks 10-12: 1 piece every 4-8 hours. Max 24 pieces per day. 10/20/15   Emokpae, Ejiroghene E, MD  olopatadine (PATANOL) 0.1 % ophthalmic solution Place 1 drop into both eyes 2 (two) times daily. 05/29/17 05/29/18  Aldine Contes, MD  omeprazole (PRILOSEC) 20 MG capsule Take 1 capsule (20 mg total) by mouth daily. 02/14/17   Ophelia Shoulder, MD  ondansetron (ZOFRAN-ODT) 4 MG disintegrating tablet Take 1 tablet (4 mg total) by mouth every 8 (eight) hours as needed for nausea or vomiting. 12/16/16   Norman Herrlich, MD  warfarin (COUMADIN) 5 MG tablet Take 3 tablets on Mondays and Fridays; all other days--take 2&1/2 tablets. 05/19/17   Lucious Groves, DO    Family History Family History  Problem Relation Age of Onset  . Hypertension Mother   . Congestive Heart Failure Mother   . Diabetes Father   . Birth defects Maternal Aunt   . Birth defects Maternal Uncle   . Diabetes Paternal Grandmother   . Breast cancer Sister   . Breast cancer Sister     Social History Social History   Tobacco Use  . Smoking status: Current Every Day Smoker    Packs/day: 0.25    Years: 0.00    Pack years: 0.00    Types: Cigarettes  . Smokeless tobacco: Never Used  . Tobacco comment: ABOUT 6 CIGARETTES A DAY  Substance Use Topics  . Alcohol use: Yes    Alcohol/week: 0.0 oz  . Drug use: Yes    Types: Marijuana     Allergies   Patient has no known allergies.   Review of Systems Review of Systems  All other systems reviewed and are negative.   Physical Exam Updated Vital Signs BP (!) 157/106 (BP Location: Right Arm)   Pulse 61   Temp 97.7 F (36.5 C) (Oral)   Resp 18   LMP  07/12/2017   SpO2 100%   Physical Exam  Constitutional: She is oriented to person, place, and time. She appears well-developed and well-nourished.  HENT:  Head: Normocephalic and atraumatic.  Eyes: Conjunctivae are normal. Pupils are equal, round, and reactive to light. Right eye exhibits no discharge. Left eye exhibits no discharge. No scleral icterus.  Neck: Normal range of motion. No JVD present. No tracheal deviation present.  Pulmonary/Chest: Effort normal. No stridor.  Musculoskeletal:  Bilateral lower extremities equal.  Tenderness palpation of the lateral and posterior upper extremity, no loss of sensation strength or motor function.  Pulse 2+.  Neurological: She is alert and oriented to person, place, and time. Coordination normal.  Psychiatric: She has a normal mood and affect. Her behavior is normal. Judgment and thought content normal.  Nursing note and vitals reviewed.   ED Treatments / Results  Labs (all labs ordered are listed, but only abnormal results are displayed) Labs Reviewed - No data to display  EKG  EKG Interpretation None       Radiology No results found.  Procedures Procedures (including critical care time)  Medications Ordered in ED Medications  oxyCODONE-acetaminophen (PERCOCET/ROXICET) 5-325 MG per tablet 1 tablet (1 tablet Oral Given 08/06/17 1227)  oxyCODONE-acetaminophen (PERCOCET/ROXICET) 5-325 MG per tablet (not administered)     Initial Impression / Assessment and Plan / ED Course  I have reviewed the triage vital signs and the nursing notes.  Pertinent labs & imaging results that were available during my care of the patient were reviewed by me and considered in my medical decision making (see chart for details).     Final Clinical Impressions(s) / ED Diagnoses   Final diagnoses:  Right leg pain    Labs:   Imaging:  Consults:  Therapeutics:  Discharge Meds:   Assessment/Plan: 41 year old female presents today with  right leg pain.  She has warm well perfused extremity, pedal pulses intact.  She has no swelling or edema.  Patient is currently being treated for DVTs.  This is likely muscular in nature.  She has no red flags, encouraged to use Tylenol as needed for discomfort, follow-up with primary care if symptoms continue to persist.  Return precautions given.  She verbalized understanding and agreement to today's plan.    ED Discharge Orders    None       Okey Regal, Hershal Coria 08/06/17 1609    Gareth Morgan, MD 08/08/17 [phone]

## 2017-08-06 NOTE — ED Notes (Signed)
Pt has not returned, DC but did not stay to receive paperwork

## 2017-08-06 NOTE — Discharge Instructions (Signed)
Please read attached information. If you experience any new or worsening signs or symptoms please return to the emergency room for evaluation. Please follow-up with your primary care provider or specialist as discussed. Please use medication prescribed only as directed and discontinue taking if you have any concerning signs or symptoms.   °

## 2017-08-06 NOTE — Progress Notes (Signed)
I reviewed Dr. Gladstone Pih note.  Patient is on [facility] for hypercoagulable state and INR was at goal.

## 2017-08-06 NOTE — ED Notes (Signed)
Unable to locate patient in lobby 

## 2017-08-06 NOTE — ED Notes (Signed)
Pt reports she needed to walk to her car to look for her purse explained her DC paperwork should be printed soon, pt states "I will be back".

## 2017-08-06 NOTE — ED Triage Notes (Signed)
Pt reports right thigh pain for several days. Pain is in her buttock and radiates down back of leg. Pt unable to sleep or rest. Has hx of blood clots. Also reports recent depression and is out of her meds. Denies SI or HI.

## 2017-08-07 ENCOUNTER — Emergency Department (HOSPITAL_COMMUNITY)
Admission: EM | Admit: 2017-08-07 | Discharge: 2017-08-07 | Disposition: A | Discharge status: Home / Self Care | Payer: Medicaid Other | Source: Home / Self Care | Attending: Emergency Medicine | Admitting: Emergency Medicine

## 2017-08-07 ENCOUNTER — Emergency Department (HOSPITAL_COMMUNITY): Payer: Medicaid Other

## 2017-08-07 ENCOUNTER — Encounter (HOSPITAL_COMMUNITY): Payer: Self-pay | Admitting: Emergency Medicine

## 2017-08-07 DIAGNOSIS — M25551 Pain in right hip: Secondary | ICD-10-CM

## 2017-08-07 MED ORDER — CYCLOBENZAPRINE HCL 10 MG PO TABS: 10.0000 mg | ORAL_TABLET | Freq: Two times a day (BID) | ORAL | 0 refills | Status: DC | PRN

## 2017-08-07 MED ORDER — PREDNISONE 20 MG PO TABS: ORAL_TABLET | ORAL | 0 refills | Status: DC

## 2017-08-07 MED ORDER — OXYCODONE-ACETAMINOPHEN 5-325 MG PO TABS
ORAL_TABLET | ORAL | Status: AC
  Filled 2017-08-07: qty 1

## 2017-08-07 MED ORDER — OXYCODONE-ACETAMINOPHEN 5-325 MG PO TABS
1.0000 | ORAL_TABLET | Freq: Once | ORAL | Status: AC
  Administered 2017-08-07: 1 via ORAL

## 2017-08-07 NOTE — ED Notes (Addendum)
Pt reports pain in her hip that started 6 days. Pt denies any injury to same. Pt states she took OTC meds with no relief.

## 2017-08-07 NOTE — ED Triage Notes (Signed)
Patient reports persistent pain at right hip radiating to right leg worse with movement/changing positions , denies injury or fall .

## 2017-08-07 NOTE — ED Provider Notes (Signed)
[place] EMERGENCY DEPARTMENT Provider Note   CSN: [id number] Arrival date & time: 08/06/17  2354     History   Chief Complaint Chief Complaint  Patient presents with  . Hip Pain    HPI Michele Gillespie is a 41 y.o. female.  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  Patient reported having severe pain to her right hip and leg ongoing for the past 2 weeks.  Pain is sharp and stabbing radiates down to her calf.  Pain worse with movement.  She is having trouble sleeping or putting weight on her hip due to the pain.  No report of fever or chills, numbness or weakness.  She is currently on Coumadin and her last INR was 2.2 2 days ago.  No complaints of back pain, knee pain or ankle pain.  She denies any recent injury.  No bowel bladder incontinence or saddle anesthesia.  No history of IV drug use  Or active cancer.  She was seen in the ER yesterday for her complaint.  Patient states she was evaluated but no specific treatment was given.  She reports her hip pain is preventing her from walking and working. She is currently using a walker to help move around.    Past Medical History:  Diagnosis Date  . Anemia 2007    microcytic anemia, baseline hemoglobin 10-11, MCV at baseline 72-77, secondary to iron deficiency  . Arm DVT (deep venous thromboembolism), acute [facility])  September 23, 2009, January 05, 2010    Doppler study significant with indeterminant age DVT involving the left upper extremity, Doppler performed January 05, 2010 consistent with acute DVT involving the left upper extremity  . Asthma   . Depression   . Leukopenia 2008    unclear etiology baseline WBC  2.8-3.7  . Lung nodule  June 10, 2008    stable tiny noduke noted along the minor fissure of the right lung on CT angio September 11, 09 -  stable for 2 years and consistent with benign disease  . Pulmonary  embolism [facility])  September 07, 2005    her CT angiogram - positive for pulmonary emboli to several branches of the right lower lobe- relatively small clot burden, clear lung; patient started on Coumadin; CT angiogram on January 10, 2006 showed resolution of previously seen pulmonary emboli with minimal basilar atelectasis  . Schizophrenia [facility])     Patient Active Problem List   Diagnosis Date Noted  . Herpes zoster 05/12/2017  . Acute deep vein thrombosis (DVT) of brachial vein of left upper extremity ([place]) 12/16/2016  . Depression 06/20/2016  . Preventative health care 06/20/2016  . Pelvic mass in female 06/20/2016  . Chest pain 02/15/2016  . Secondary amenorrhea 09/14/2014  . Allergic rhinitis with Asthma. 09/14/2014  . GERD (gastroesophageal reflux disease) 09/14/2013  . Breast mass, right 09/14/2013  . Healthcare maintenance 09/14/2013  . Cervical mass 02/08/2013  . H/O cesarean section 12/21/2012  . Tobacco use disorder 06/22/2012  . Long term current use of anticoagulant 10/20/2010  . DEPRESSION, HX OF 05/16/2010  . Iron deficiency anemia 10/12/2006  . LEUKOCYTOPENIA NOS 10/12/2006  . HYPERCOAGULABLE STATE, PRIMARY 10/12/2006    Past Surgical History:  Procedure Laterality Date  . CESAREAN SECTION      History of 5 C-section  . EXPLORATORY LAPAROTOMY WITH ABDOMINAL MASS EXCISION  02/2005  . TUBAL LIGATION  OB History    Gravida Para Term Preterm AB Living   5 4 4  0 0 5   SAB TAB Ectopic Multiple Live Births   0 0 0 0         Home Medications    Prior to Admission medications   Medication Sig Start Date End Date Taking? Authorizing Provider  acetaminophen (TYLENOL) 325 MG tablet Take 650 mg by mouth every 6 (six) hours as needed for mild pain.    [provider]  albuterol (PROVENTIL HFA) 108 (90 Base) MCG/ACT inhaler INHALE 2 PUFFS INTO THE LUNGS EVERY 6 HOURS AS NEEDED FOR WHEEZING. FOR SHORTNESS OF BREATH Patient taking differently: Inhale 2 puffs  into the lungs every 6 (six) hours as needed for shortness of breath.  12/11/16   Ophelia Shoulder, MD  ARIPiprazole (ABILIFY) 10 MG tablet Take 1 tablet (10 mg total) by mouth daily. 09/09/16   Norman Herrlich, MD  diclofenac sodium (VOLTAREN) 1 % GEL Apply 2 g topically 4 (four) times daily. 02/14/17   Ophelia Shoulder, MD  HYDROcodone-acetaminophen (NORCO/VICODIN) 5-325 MG tablet Take 2 tablets by mouth every 4 (four) hours as needed. 01/25/17   Esaw Grandchild, MD  nicotine polacrilex (NICORETTE) 2 MG gum RX #3 Weeks 10-12: 1 piece every 4-8 hours. Max 24 pieces per day. 10/20/15   Emokpae, Ejiroghene E, MD  olopatadine (PATANOL) 0.1 % ophthalmic solution Place 1 drop into both eyes 2 (two) times daily. 05/29/17 05/29/18  Aldine Contes, MD  omeprazole (PRILOSEC) 20 MG capsule Take 1 capsule (20 mg total) by mouth daily. 02/14/17   Ophelia Shoulder, MD  ondansetron (ZOFRAN-ODT) 4 MG disintegrating tablet Take 1 tablet (4 mg total) by mouth every 8 (eight) hours as needed for nausea or vomiting. 12/16/16   Norman Herrlich, MD  warfarin (COUMADIN) 5 MG tablet Take 3 tablets on Mondays and Fridays; all other days--take 2&1/2 tablets. 05/19/17   Lucious Groves, DO    Family History Family History  Problem Relation Age of Onset  . Hypertension Mother   . Congestive Heart Failure Mother   . Diabetes Father   . Birth defects Maternal Aunt   . Birth defects Maternal Uncle   . Diabetes Paternal Grandmother   . Breast cancer Sister   . Breast cancer Sister     Social History Social History   Tobacco Use  . Smoking status: Current Every Day Smoker    Packs/day: 0.25    Years: 0.00    Pack years: 0.00    Types: Cigarettes  . Smokeless tobacco: Never Used  . Tobacco comment: ABOUT 6 CIGARETTES A DAY  Substance Use Topics  . Alcohol use: Yes    Alcohol/week: 0.0 oz  . Drug use: Yes    Types: Marijuana     Allergies   Patient has no known allergies.   Review of Systems Review of Systems    Constitutional: Negative for fever.  Musculoskeletal: Positive for arthralgias.  Skin: Negative for wound.  Neurological: Negative for numbness.     Physical Exam Updated Vital Signs BP 130/89   Pulse 73   Resp 16   LMP 07/12/2017   SpO2 99%   Physical Exam  Constitutional: She appears well-developed and well-nourished. No distress.  HENT:  Head: Atraumatic.  Eyes: Conjunctivae are normal.  Neck: Neck supple.  Cardiovascular: Intact distal pulses.  Abdominal: Soft. She exhibits no distension. There is no tenderness.  Musculoskeletal: She exhibits tenderness (Right hip: Tenderness to lateral hip  and along the inguinal crease on palpation without any overlying skin changes.  Full passive range of motion but decreased active range of motion secondary to pain.  No deformity noted.  leg compartment is soft.  [place]). She exhibits no edema.  No Lspine tenderness on palpation  Neurological: She is alert.  RLE: Patella DTR 2+, no foot drop  Skin: No rash noted.  Psychiatric: She has a normal mood and affect.  Nursing note and vitals reviewed.    ED Treatments / Results  Labs (all labs ordered are listed, but only abnormal results are displayed) Labs Reviewed - No data to display  EKG  EKG Interpretation None       Radiology Dg Hip Unilat W Or Wo Pelvis 2-3 Views Right  Result Date: 08/07/2017 CLINICAL DATA:  Right hip pain for 6 days.  No known injury. EXAM: DG HIP (WITH OR WITHOUT PELVIS) 2-3V RIGHT COMPARISON:  09/09/2016 FINDINGS: Heterotopic ossification or hypertrophic change adjacent to the right lateral acetabulum. This is unchanged since previous study. No evidence of acute fracture or dislocation. Pelvis appears intact. SI joints and symphysis pubis are not displaced. IMPRESSION: No acute bony abnormalities. Prominent osteophyte or heterotopic calcification adjacent to the right acetabulum is unchanged. Electronically Signed   By: Lucienne Capers M.D.   On:  08/07/2017 00:35    Procedures Procedures (including critical care time)  Medications Ordered in ED Medications  oxyCODONE-acetaminophen (PERCOCET/ROXICET) 5-325 MG per tablet (not administered)  oxyCODONE-acetaminophen (PERCOCET/ROXICET) 5-325 MG per tablet 1 tablet (1 tablet Oral Given 08/07/17 0008)     Initial Impression / Assessment and Plan / ED Course  I have reviewed the triage vital signs and the nursing notes.  Pertinent labs & imaging results that were available during my care of the patient were reviewed by me and considered in my medical decision making (see chart for details).     BP 130/89   Pulse 73   Resp 16   LMP 07/12/2017   SpO2 99%    Final Clinical Impressions(s) / ED Diagnoses   Final diagnoses:  Right hip pain    ED Discharge Orders        Ordered    cyclobenzaprine (FLEXERIL) 10 MG tablet  2 times daily PRN     08/07/17 0623    predniSONE (DELTASONE) 20 MG tablet     08/07/17 0623     6:19 AM Pt here with atraumatic R hip pain.  No overlying skin changes to suggest infection, doubt septic joint.  Xray today without acute changes.  No lower back pain and no abd pain.  Pain worse with movement, likely MSK.  Pt d/c home with a course of steroid along with muscle relaxant.  Orthopedic referral given.  Return precaution discussed.   Domenic Moras, PA-C 07/07/11 1975    Delora Fuel, MD 88/32/54 [phone]

## 2017-08-07 NOTE — Discharge Instructions (Signed)
Please follow up with orthopedist if your pain persists.

## 2017-08-10 ENCOUNTER — Other Ambulatory Visit: Payer: Self-pay

## 2017-08-10 ENCOUNTER — Encounter (HOSPITAL_COMMUNITY): Payer: Self-pay

## 2017-08-10 ENCOUNTER — Emergency Department (HOSPITAL_COMMUNITY)
Admission: EM | Admit: 2017-08-10 | Discharge: 2017-08-10 | Disposition: A | Discharge status: Home / Self Care | Payer: Medicaid Other | Attending: Emergency Medicine | Admitting: Emergency Medicine

## 2017-08-10 DIAGNOSIS — R791 Abnormal coagulation profile: Secondary | ICD-10-CM | POA: Diagnosis not present

## 2017-08-10 DIAGNOSIS — M25551 Pain in right hip: Secondary | ICD-10-CM | POA: Insufficient documentation

## 2017-08-10 DIAGNOSIS — J45909 Unspecified asthma, uncomplicated: Secondary | ICD-10-CM | POA: Insufficient documentation

## 2017-08-10 DIAGNOSIS — Z79899 Other long term (current) drug therapy: Secondary | ICD-10-CM | POA: Insufficient documentation

## 2017-08-10 DIAGNOSIS — Z7901 Long term (current) use of anticoagulants: Secondary | ICD-10-CM | POA: Insufficient documentation

## 2017-08-10 DIAGNOSIS — F17210 Nicotine dependence, cigarettes, uncomplicated: Secondary | ICD-10-CM | POA: Insufficient documentation

## 2017-08-10 LAB — PROTIME-INR
INR: 3.85
Prothrombin Time: 37.6 seconds — ABNORMAL HIGH (ref 11.4–15.2)

## 2017-08-10 MED ORDER — TRAMADOL HCL 50 MG PO TABS: 50.0000 mg | ORAL_TABLET | Freq: Four times a day (QID) | ORAL | 0 refills | Status: DC | PRN

## 2017-08-10 MED ORDER — OXYCODONE-ACETAMINOPHEN 5-325 MG PO TABS
1.0000 | ORAL_TABLET | Freq: Once | ORAL | Status: AC
  Administered 2017-08-10: 1 via ORAL
  Filled 2017-08-10: qty 1

## 2017-08-10 NOTE — ED Notes (Signed)
Pt verbalized understanding of discharge instructuions.

## 2017-08-10 NOTE — ED Provider Notes (Signed)
[facility] EMERGENCY DEPARTMENT Provider Note   CSN: [id number] Arrival date & time: 08/10/17  8469     History   Chief Complaint No chief complaint on file.   HPI Michele Gillespie is a 41 y.o. female.  HPI  41 y.o. female with a hx of DVT on Coumadin, presents to the Emergency Department today due to persistent right hip/leg pain. This is the patient's 3rd visit in ED for the same since 08-06-17. Notes persistent right inguinal pain that is worse with movement. Notes increased weakness to right leg due to pain. Denies back pain. No loss of bowel or bladder function. No saddle anesthesia. Denies numbness. Notes pain 10/10 and describes and a cramping/pressure sensation. Pt has tried muscle relaxants, steroids, and OTC medications with no relief. Pt following up with Orthopedics on Wednesday, but cannot wait due to the pain. Pt concern this could be a blood clot as she has had clots despite being on Coumadin. No fevers. No other symptoms noted    Past Medical History:  Diagnosis Date  . Anemia 2007    microcytic anemia, baseline hemoglobin 10-11, MCV at baseline 72-77, secondary to iron deficiency  . Arm DVT (deep venous thromboembolism), acute [facility])  September 23, 2009, January 05, 2010    Doppler study significant with indeterminant age DVT involving the left upper extremity, Doppler performed January 05, 2010 consistent with acute DVT involving the left upper extremity  . Asthma   . Depression   . Leukopenia 2008    unclear etiology baseline WBC  2.8-3.7  . Lung nodule  June 10, 2008    stable tiny noduke noted along the minor fissure of the right lung on CT angio September 11, 09 -  stable for 2 years and consistent with benign disease  . Pulmonary embolism [facility])  September 07, 2005    her CT angiogram - positive for pulmonary emboli to several branches of the right lower lobe- relatively small clot burden, clear lung; patient started on Coumadin; CT angiogram on  January 10, 2006 showed resolution of previously seen pulmonary emboli with minimal basilar atelectasis  . Schizophrenia [facility])     Patient Active Problem List   Diagnosis Date Noted  . Herpes zoster 05/12/2017  . Acute deep vein thrombosis (DVT) of brachial vein of left upper extremity ([place]) 12/16/2016  . Depression 06/20/2016  . Preventative health care 06/20/2016  . Pelvic mass in female 06/20/2016  . Chest pain 02/15/2016  . Secondary amenorrhea 09/14/2014  . Allergic rhinitis with Asthma. 09/14/2014  . GERD (gastroesophageal reflux disease) 09/14/2013  . Breast mass, right 09/14/2013  . Healthcare maintenance 09/14/2013  . Cervical mass 02/08/2013  . H/O cesarean section 12/21/2012  . Tobacco use disorder 06/22/2012  . Long term current use of anticoagulant 10/20/2010  . DEPRESSION, HX OF 05/16/2010  . Iron deficiency anemia 10/12/2006  . LEUKOCYTOPENIA NOS 10/12/2006  . HYPERCOAGULABLE STATE, PRIMARY 10/12/2006    Past Surgical History:  Procedure Laterality Date  . CESAREAN SECTION      History of 5 C-section  . EXPLORATORY LAPAROTOMY WITH ABDOMINAL MASS EXCISION  02/2005  . TUBAL LIGATION      OB History    Gravida Para Term Preterm AB Living   5 4 4  0 0 5   SAB TAB Ectopic Multiple Live Births   0 0 0 0         Home Medications    Prior to Admission medications   Medication Sig  Start Date End Date Taking? Authorizing Provider  acetaminophen (TYLENOL) 325 MG tablet Take 650 mg by mouth every 6 (six) hours as needed for mild pain.    [provider]  albuterol (PROVENTIL HFA) 108 (90 Base) MCG/ACT inhaler INHALE 2 PUFFS INTO THE LUNGS EVERY 6 HOURS AS NEEDED FOR WHEEZING. FOR SHORTNESS OF BREATH Patient taking differently: Inhale 2 puffs into the lungs every 6 (six) hours as needed for shortness of breath.  12/11/16   Ophelia Shoulder, MD  ARIPiprazole (ABILIFY) 10 MG tablet Take 1 tablet (10 mg total) by mouth daily. 09/09/16   Norman Herrlich, MD    cyclobenzaprine (FLEXERIL) 10 MG tablet Take 1 tablet (10 mg total) 2 (two) times daily as needed by mouth for muscle spasms. 08/07/17   Domenic Moras, PA-C  diclofenac sodium (VOLTAREN) 1 % GEL Apply 2 g topically 4 (four) times daily. 02/14/17   Ophelia Shoulder, MD  HYDROcodone-acetaminophen (NORCO/VICODIN) 5-325 MG tablet Take 2 tablets by mouth every 4 (four) hours as needed. 01/25/17   Esaw Grandchild, MD  nicotine polacrilex (NICORETTE) 2 MG gum RX #3 Weeks 10-12: 1 piece every 4-8 hours. Max 24 pieces per day. 10/20/15   Emokpae, Ejiroghene E, MD  olopatadine (PATANOL) 0.1 % ophthalmic solution Place 1 drop into both eyes 2 (two) times daily. 05/29/17 05/29/18  Aldine Contes, MD  omeprazole (PRILOSEC) 20 MG capsule Take 1 capsule (20 mg total) by mouth daily. 02/14/17   Ophelia Shoulder, MD  ondansetron (ZOFRAN-ODT) 4 MG disintegrating tablet Take 1 tablet (4 mg total) by mouth every 8 (eight) hours as needed for nausea or vomiting. 12/16/16   Norman Herrlich, MD  predniSONE (DELTASONE) 20 MG tablet 2 tabs po daily x 4 days 08/07/17   Domenic Moras, PA-C  warfarin (COUMADIN) 5 MG tablet Take 3 tablets on Mondays and Fridays; all other days--take 2&1/2 tablets. 05/19/17   Lucious Groves, DO    Family History Family History  Problem Relation Age of Onset  . Hypertension Mother   . Congestive Heart Failure Mother   . Diabetes Father   . Birth defects Maternal Aunt   . Birth defects Maternal Uncle   . Diabetes Paternal Grandmother   . Breast cancer Sister   . Breast cancer Sister     Social History Social History   Tobacco Use  . Smoking status: Current Every Day Smoker    Packs/day: 0.25    Years: 0.00    Pack years: 0.00    Types: Cigarettes  . Smokeless tobacco: Never Used  . Tobacco comment: ABOUT 6 CIGARETTES A DAY  Substance Use Topics  . Alcohol use: Yes    Alcohol/week: 0.0 oz  . Drug use: Yes    Types: Marijuana     Allergies   Patient has no known allergies.   Review  of Systems Review of Systems ROS reviewed and all are negative for acute change except as noted in the HPI.  Physical Exam Updated Vital Signs BP (!) 143/110 (BP Location: Right Arm)   Pulse 78   Temp 98.5 F (36.9 C) (Oral)   Resp 16   Ht 5' 4.5" (1.638 m)   Wt 98 kg (216 lb)   LMP 07/12/2017   SpO2 100%   BMI 36.50 kg/m   Physical Exam  Constitutional: She is oriented to person, place, and time. She appears well-developed and well-nourished. No distress.  HENT:  Head: Normocephalic and atraumatic.  Right Ear: Tympanic membrane, external ear and  ear canal normal.  Left Ear: Tympanic membrane, external ear and ear canal normal.  Nose: Nose normal.  Mouth/Throat: Uvula is midline, oropharynx is clear and moist and mucous membranes are normal. No trismus in the jaw. No oropharyngeal exudate, posterior oropharyngeal erythema or tonsillar abscesses.  Eyes: EOM are normal. Pupils are equal, round, and reactive to light.  Neck: Normal range of motion. Neck supple. No tracheal deviation present.  Cardiovascular: Normal rate, regular rhythm, S1 normal, S2 normal, normal heart sounds, intact distal pulses and normal pulses.  Pulmonary/Chest: Effort normal and breath sounds normal. No respiratory distress. She has no decreased breath sounds. She has no wheezes. She has no rhonchi. She has no rales.  Abdominal: Normal appearance.  Musculoskeletal: Normal range of motion.  Pt with point tenderness right inguinal region. No swelling or deformities palpable. Pain with internal/external rotation of hip as well as inversion and eversion. NVI. Distal pulses appreciated. No lower lumbar spinous process tenderness.   Neurological: She is alert and oriented to person, place, and time.  Skin: Skin is warm and dry.  Psychiatric: She has a normal mood and affect. Her speech is normal and behavior is normal. Thought content normal.  Nursing note and vitals reviewed.    ED Treatments / Results   Labs (all labs ordered are listed, but only abnormal results are displayed) Labs Reviewed  PROTIME-INR    EKG  EKG Interpretation None       Radiology No results found.  Procedures Procedures (including critical care time)  Medications Ordered in ED Medications  oxyCODONE-acetaminophen (PERCOCET/ROXICET) 5-325 MG per tablet 1 tablet (not administered)     Initial Impression / Assessment and Plan / ED Course  I have reviewed the triage vital signs and the nursing notes.  Pertinent labs & imaging results that were available during my care of the patient were reviewed by me and considered in my medical decision making (see chart for details).  Final Clinical Impressions(s) / ED Diagnoses  {I have reviewed and evaluated the relevant laboratory values. {I have reviewed and evaluated the relevant imaging studies.  {I have reviewed the relevant previous healthcare records.  {I obtained HPI from historian.   ED Course:  Assessment: Pt is a 41 y.o. female with a hx of DVT on Coumadin, presents to the Emergency Department today due to persistent right hip/leg pain. This is the patient's 3rd visit in ED for the same since 08-06-17. Notes persistent right inguinal pain that is worse with movement. Notes increased weakness to right leg due to pain. Denies back pain. No loss of bowel or bladder function. No saddle anesthesia. Denies numbness. Notes pain 10/10 and describes and a cramping/pressure sensation. Pt has tried muscle relaxants, steroids, and OTC medications with no relief. Pt following up with Orthopedics on Wednesday, but cannot wait due to the pain. Pt concern this could be a blood clot as she has had clots despite being on Coumadin. No fevers. . On exam, pt in NAD. Nontoxic/nonseptic appearing. VSS. Afebrile. Pt with point tenderness right inguinal region. No swelling or deformities palpable. Pain with internal/external rotation of hip as well as inversion and eversion. NVI. Distal  pulses appreciated. No lower lumbar spinous process tenderness. Xray imaging has been unremarkable in the past for acute abnormality. Did show prominent osteophyte or heterotopic calcification adjacent to right acetabulum. On exam, pt appears to be tender to this area. Suspect this could be the etiology of the pain. Will DC home once PT/INR results and therapeutic.  Pt compliant with Coumadin. Given analgesia in ED. Plan is to DC home with follow up to Orthopedics on Wednesday. Given Rx Tramadol. I have reviewed the [place] Controlled Substance Reporting System. At time of discharge, Patient is in no acute distress. Vital Signs are stable. Patient is able to ambulate. Patient able to tolerate PO.   Disposition/Plan:  DC Home Additional Verbal discharge instructions given and discussed with patient.  Pt Instructed to f/u with PCP in the next week for evaluation and treatment of symptoms. Return precautions given Pt acknowledges and agrees with plan  Supervising Physician Valarie Merino, MD  Final diagnoses:  Right hip pain    ED Discharge Orders    None       Shary Decamp, Hershal Coria 08/10/17 2028    Valarie Merino, MD 08/11/17 [phone]

## 2017-08-10 NOTE — Discharge Instructions (Addendum)
Please read and follow all provided instructions.  Your diagnoses today include:  1. Right hip pain    Tests performed today include: Vital signs. See below for your results today.   Medications prescribed:  Take as prescribed   Home care instructions:  Follow any educational materials contained in this packet.  Follow-up instructions: Please follow-up with Orthopedics on Wednesday for further evaluation of symptoms and treatment  Your INR was 3.85 today. For this reason, we advise that you take 2 tablets of Coumadin on 08/11/17 and 08/12/17, then resume usual regimen.  Return instructions:  Please return to the Emergency Department if you do not get better, if you get worse, or new symptoms OR  - Fever (temperature greater than 101.68F)  - Bleeding that does not stop with holding pressure to the area    -Severe pain (please note that you may be more sore the day after your accident)  - Chest Pain  - Difficulty breathing  - Severe nausea or vomiting  - Inability to tolerate food and liquids  - Passing out  - Skin becoming red around your wounds  - Change in mental status (confusion or lethargy)  - New numbness or weakness    Please return if you have any other emergent concerns.  Additional Information:  Your vital signs today were: BP (!) 151/92 (BP Location: Right Arm)    Pulse 68    Temp 98.5 F (36.9 C) (Oral)    Resp 18    Ht 5' 4.5" (1.638 m)    Wt 98 kg (216 lb)    LMP 07/12/2017    SpO2 100%    BMI 36.50 kg/m  If your blood pressure (BP) was elevated above 135/85 this visit, please have this repeated by your doctor within one month. ---------------

## 2017-08-10 NOTE — ED Triage Notes (Signed)
Patient here with ongoing right leg/hip pain x 8 days. Taking the muscle relaxer and steroids as directed and using voltaren cream with minimal to no relief. States pain severe with ambulation. Denies initial trauma

## 2017-08-10 NOTE — ED Provider Notes (Signed)
9:08 PM INR 3.85. Patient made aware of elevated INR and was told to decrease Coumadin to 2 tablets tomorrow and Tuesday; she may then resume current regimen. Patient verbalizes understanding.   Michele Breach, PA-C 08/10/17 2109    Valarie Merino, MD 08/11/17 [phone]

## 2017-08-12 ENCOUNTER — Encounter: Payer: Self-pay | Admitting: Internal Medicine

## 2017-08-12 ENCOUNTER — Ambulatory Visit (INDEPENDENT_AMBULATORY_CARE_PROVIDER_SITE_OTHER): Payer: Medicaid Other | Admitting: Internal Medicine

## 2017-08-12 ENCOUNTER — Other Ambulatory Visit: Payer: Self-pay

## 2017-08-12 DIAGNOSIS — M79604 Pain in right leg: Secondary | ICD-10-CM

## 2017-08-12 NOTE — Patient Instructions (Addendum)
I would recommend that you take 600mg  Ibuprofen three times per day approximately 6-8 hours apart for the next 3 days until you see the Orthopedic surgeon. You may also use a heating pad or warm water as tolerated to improve the pain.   Please follow their directions at the time of that visit.  In addition I recommend that you refrain from work until evaluated by Orthopedic surgery as this will worsen your symptoms and may prevent them from completing a thorough evaluation.   Please call the clinic or return to the ED if you develop symptoms of fever, chills, night sweats, swelling in her leg or other concerning signs.  Thank you for your visit to the [facility].

## 2017-08-12 NOTE — Progress Notes (Signed)
Internal Medicine Clinic Attending  I saw and evaluated the patient.  I personally confirmed the key portions of the history and exam documented by Dr. Harbrecht and I reviewed pertinent patient test results.  The assessment, diagnosis, and plan were formulated together and I agree with the documentation in the resident's note.  

## 2017-08-12 NOTE — Progress Notes (Signed)
   CC: right leg pain  HPI:  [name] is a 41 y.o. presents today for evaluation of her leg pain. She has been seen in the ED on three previous visits on 11/07, 11/08 and 11/11 for the right thigh/leg pain. She has an appointment with Orthopedics on 08/13/2017 for what was described as a steophyte or heterotopic calcification adjacent to right acetabulum (bone spur). She was treated with Tramadol and discharged. Patient was supratheraputic with regard to her INR at her most recent visit which prompted a change to her coumadin regimen by the ED staff as per note.  Two week history of sudden onset (after sleeping), right leg/thigh pain. Pain is worse in the right hip and "radiates" down the leg to the knee. Patient stated that she would like something stronger for the pain today so that she may go to work this evening. The tramadol has caused her to feel nauseous since beginning this therapy.    Past Medical History:  Diagnosis Date  . Anemia 2007    microcytic anemia, baseline hemoglobin 10-11, MCV at baseline 72-77, secondary to iron deficiency  . Arm DVT (deep venous thromboembolism), acute [facility])  September 23, 2009, January 05, 2010    Doppler study significant with indeterminant age DVT involving the left upper extremity, Doppler performed January 05, 2010 consistent with acute DVT involving the left upper extremity  . Asthma   . Depression   . Leukopenia 2008    unclear etiology baseline WBC  2.8-3.7  . Lung nodule  June 10, 2008    stable tiny noduke noted along the minor fissure of the right lung on CT angio September 11, 09 -  stable for 2 years and consistent with benign disease  . Pulmonary embolism [facility])  September 07, 2005    her CT angiogram - positive for pulmonary emboli to several branches of the right lower lobe- relatively small clot burden, clear lung; patient started on Coumadin; CT angiogram on January 10, 2006 showed resolution of previously seen pulmonary emboli with  minimal basilar atelectasis  . Schizophrenia ([place])    Review of Systems:  ROS negative except as per HPI>  Physical Exam:  There were no vitals filed for this visit. Physical Exam  Constitutional: She appears well-developed and well-nourished. No distress.  Cardiovascular: Normal rate and regular rhythm.  No murmur heard. Pulmonary/Chest: Effort normal and breath sounds normal. No stridor. No respiratory distress.  Abdominal: Soft. Bowel sounds are normal. She exhibits no distension. There is no tenderness.  Musculoskeletal: She exhibits tenderness (Point tenderness to the right inguinal area directly distal to the ASIS. ). She exhibits no edema (There is no evidence of edema, swelling or unilateral changes to the legs or skin.).  Neurological: She is alert. No sensory deficit.  Strength is intact bilaterally 5/5 in the lower extremities.    Assessment & Plan:   See Encounters Tab for problem based charting.  Patient seen with Dr. Daryll Drown

## 2017-08-12 NOTE — Assessment & Plan Note (Addendum)
Assessment: 2 wks RLE pain, worse with activity supratheraputic INR, no evidence of clot or hematoma Pain extends to knee, does not radiate in the classic nerve impingement fashion. Most likely referred pain from the hip  Plan: There is pain with internal and external rotation of the hip, more prominently noted on external rotation of the hip(ankle brought medially over the contralateral knee). Recommended Ibuprofen 600mg  TID for three days. Follow with Orthopedics for evaluation and determination of intervention.  Patient advised to call if she develops symptoms of fever, chills, night sweats, swelling in her leg or other concerning signs.

## 2017-08-13 ENCOUNTER — Ambulatory Visit (INDEPENDENT_AMBULATORY_CARE_PROVIDER_SITE_OTHER): Payer: Medicaid Other | Admitting: Surgery

## 2017-08-13 ENCOUNTER — Encounter (INDEPENDENT_AMBULATORY_CARE_PROVIDER_SITE_OTHER): Payer: Self-pay | Admitting: Surgery

## 2017-08-13 DIAGNOSIS — M25551 Pain in right hip: Secondary | ICD-10-CM | POA: Diagnosis not present

## 2017-08-13 NOTE — Progress Notes (Signed)
Office Visit Note   Patient: Michele Gillespie           Date of Birth: [birth year]           MRN: [id number] Visit Date: 08/13/2017              Requested by: Neva Seat, MD [address] [place], [place] [address] PCP: Neva Seat, MD   Assessment & Plan: Visit Diagnoses:  1. Pain in right hip     Plan: With patient's ongoing and worsening hip pain that has failed conservative treatment with prednisone taper, muscle relaxers, pain medication, and rest and we'll schedule right hip MRI to rule out loose body, AVN, soft tissue pathology and evaluate the extent of any degenerative changes. We'll see if we get this done within the next couple of days. Follow up after completion to discuss results and further treatment options. May consider diagnostic/therapeutic intra-articular right hip injection depending upon what is seen on the scan.  Follow-Up Instructions: Return in about 2 weeks (around 08/27/2017).   Orders:  Orders Placed This Encounter  Procedures  . MR Hip Right w/o contrast   No orders of the defined types were placed in this encounter.     Procedures: No procedures performed   Clinical Data: No additional findings.   Subjective: Chief Complaint  Patient presents with  . Right Hip - Pain    HPI Patient comes in today with worsening right hip pain. Reviewed records in epic. In the last week patient has had 3 ER visits for right hip pain. Has had conservative management with prednisone taper, Flexeril, tramadol. No relief with these medications. She's had off-and-on right hip pain for over a year but this is been worse 4 weeks. Pain may have been aggravated after she jumped off a porch at her aunt's house and had increased pain in the groin after that. She is a localized most of her pain to the groin area and worse with ambulation and hip rotation. Also has some pain over the lateral hip. Not describing any true lumbar radicular symptoms. No left groin pain.  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 She's also had DVT in the upper extremity as well. Patient had a right hip x-ray performed 08/07/2017 and radiologist report read heterotopic ossification or heterotrophic changes adjacent to the right lateral acetabulum. This is unchanged since previous study. No evidence of acute fracture or dislocation. Pelvis appears intact. SI joints and symphysis pubis are not displaced.  MRI lumbar spine 06/07/2016 report read no evidence of disc generation, bulge or herniation. Mild lower lumbar facet hypertrophy without edema. No stenosis or neural compression. No traumatic finding.  Review of Systems No current cardiac pulmonary GI GU issues  Objective: Vital Signs: LMP 08/10/2017   Physical Exam  Constitutional: She is oriented to person, place, and time. She appears well-developed. She appears distressed (When she is ambulating due to hip pain).  HENT:  Head: Normocephalic and atraumatic.  Eyes: EOM are normal. Pupils are equal, round, and reactive to light.  Pulmonary/Chest: No respiratory distress.  Abdominal: She exhibits no distension.  Musculoskeletal:  Gait considerably antalgic. Negative straight leg raise. Right hip she has marked groin pain with about 5-10 internal/external rotation. Pain with right hip flexion. She is March markedly tender over the right hip greater trochanter  bursa. Tenderness also down the course of her IT band. Left hip unremarkable. Bilateral knees good range of motion. Nontender. Bilateral calves are nontender. Neurovascular intact. No focal motor deficits.  Neurological: She is alert and oriented to person, place, and time.  Skin: Skin is warm and dry.  Psychiatric: She has a normal mood and affect.    Ortho Exam  Specialty Comments:  No specialty comments  available.  Imaging: No results found.   PMFS History: Patient Active Problem List   Diagnosis Date Noted  . Herpes zoster 05/12/2017  . Acute deep vein thrombosis (DVT) of brachial vein of left upper extremity ([place]) 12/16/2016  . Right leg pain 09/09/2016  . Depression 06/20/2016  . Preventative health care 06/20/2016  . Pelvic mass in female 06/20/2016  . Chest pain 02/15/2016  . Secondary amenorrhea 09/14/2014  . Allergic rhinitis with Asthma. 09/14/2014  . GERD (gastroesophageal reflux disease) 09/14/2013  . Breast mass, right 09/14/2013  . Healthcare maintenance 09/14/2013  . Cervical mass 02/08/2013  . H/O cesarean section 12/21/2012  . Tobacco use disorder 06/22/2012  . Long term current use of anticoagulant 10/20/2010  . DEPRESSION, HX OF 05/16/2010  . Iron deficiency anemia 10/12/2006  . LEUKOCYTOPENIA NOS 10/12/2006  . [place], PRIMARY 10/12/2006   Past Medical History:  Diagnosis Date  . Anemia 2007    microcytic anemia, baseline hemoglobin 10-11, MCV at baseline 72-77, secondary to iron deficiency  . Arm DVT (deep venous thromboembolism), acute [facility])  September 23, 2009, January 05, 2010    Doppler study significant with indeterminant age DVT involving the left upper extremity, Doppler performed January 05, 2010 consistent with acute DVT involving the left upper extremity  . Asthma   . Depression   . Leukopenia 2008    unclear etiology baseline WBC  2.8-3.7  . Lung nodule  June 10, 2008    stable tiny noduke noted along the minor fissure of the right lung on CT angio September 11, 09 -  stable for 2 years and consistent with benign disease  . Pulmonary embolism [facility])  September 07, 2005    her CT angiogram - positive for pulmonary emboli to several branches of the right lower lobe- relatively small clot burden, clear lung; patient started on Coumadin; CT angiogram on January 10, 2006 showed resolution of previously seen pulmonary emboli with minimal  basilar atelectasis  . Schizophrenia ([place])     Family History  Problem Relation Age of Onset  . Hypertension Mother   . Congestive Heart Failure Mother   . Diabetes Father   . Birth defects Maternal Aunt   . Birth defects Maternal Uncle   . Diabetes Paternal Grandmother   . Breast cancer Sister   . Breast cancer Sister     Past Surgical History:  Procedure Laterality Date  . CESAREAN SECTION      History of 5 C-section  . EXPLORATORY LAPAROTOMY WITH ABDOMINAL MASS EXCISION  02/2005  . TUBAL LIGATION     Social History   Occupational History  . Not on file  Tobacco Use  . Smoking status: Current Every Day Smoker    Packs/day: 0.25    Years: 0.00    Pack years: 0.00    Types: Cigarettes  . Smokeless tobacco: Never Used  . Tobacco comment: ABOUT 6 CIGARETTES A DAY  Substance and Sexual Activity  . Alcohol use: Yes    Alcohol/week: 0.0 oz  . Drug use: Yes    Types: Marijuana  .  Sexual activity: Yes    Birth control/protection: None    Comment: tubal

## 2017-08-16 ENCOUNTER — Ambulatory Visit
Admission: RE | Admit: 2017-08-16 | Discharge: 2017-08-16 | Disposition: A | Discharge status: Home / Self Care | Payer: Medicaid Other | Source: Ambulatory Visit | Attending: Surgery | Admitting: Surgery

## 2017-08-16 DIAGNOSIS — M25551 Pain in right hip: Secondary | ICD-10-CM

## 2017-08-25 ENCOUNTER — Ambulatory Visit (INDEPENDENT_AMBULATORY_CARE_PROVIDER_SITE_OTHER): Payer: Medicaid Other | Admitting: Physician Assistant

## 2017-08-25 ENCOUNTER — Encounter (INDEPENDENT_AMBULATORY_CARE_PROVIDER_SITE_OTHER): Payer: Self-pay | Admitting: Physician Assistant

## 2017-08-25 ENCOUNTER — Ambulatory Visit: Payer: Medicaid Other | Admitting: Pharmacist

## 2017-08-25 DIAGNOSIS — Z7901 Long term (current) use of anticoagulants: Secondary | ICD-10-CM

## 2017-08-25 DIAGNOSIS — M7061 Trochanteric bursitis, right hip: Secondary | ICD-10-CM

## 2017-08-25 DIAGNOSIS — D6859 Other primary thrombophilia: Secondary | ICD-10-CM

## 2017-08-25 LAB — POCT INR: INR: 2.9

## 2017-08-25 MED ORDER — WARFARIN SODIUM 5 MG PO TABS: ORAL_TABLET | ORAL | 2 refills | Status: DC

## 2017-08-25 MED ORDER — METHYLPREDNISOLONE ACETATE 40 MG/ML IJ SUSP
40.0000 mg | INTRAMUSCULAR | Status: AC | PRN
  Administered 2017-08-25: 40 mg via INTRA_ARTICULAR

## 2017-08-25 MED ORDER — LIDOCAINE HCL 1 % IJ SOLN
3.0000 mL | INTRAMUSCULAR | Status: AC | PRN
  Administered 2017-08-25: 3 mL

## 2017-08-25 NOTE — Progress Notes (Signed)
Anticoagulation Management Michele Gillespie is a 41 y.o. female who reports to the clinic for monitoring of warfarin treatment.    Indication: Hypercoagulable state, primary; long term (current) use of anticoagulants.   Duration: indefinite Supervising physician: Gilles Chiquito  Anticoagulation Clinic Visit History: Patient does not report signs/symptoms of bleeding or thromboembolism  Other recent changes: No diet, medications, lifestyle changes endorsed by the patient to me.  Anticoagulation Episode Summary    Current INR goal:   2.0-3.0  TTR:   47.4 % (5.2 y)  Next INR check:   08/25/2017  INR from last check:   2.90 (08/25/2017)  Weekly max warfarin dose:     Target end date:   Indefinite  INR check location:   [facility]  Preferred lab:     Send INR reminders to:      Indications   HYPERCOAGULABLE STATE PRIMARY [D68.59] Long term current use of anticoagulant [Z79.01]       Comments:           No Known Allergies Prior to Admission medications   Medication Sig Start Date End Date Taking? Authorizing Provider  acetaminophen (TYLENOL) 325 MG tablet Take 650 mg by mouth every 6 (six) hours as needed for mild pain.   Yes [provider]  albuterol (PROVENTIL HFA) 108 (90 Base) MCG/ACT inhaler INHALE 2 PUFFS INTO THE LUNGS EVERY 6 HOURS AS NEEDED FOR WHEEZING. FOR SHORTNESS OF BREATH Patient taking differently: Inhale 2 puffs into the lungs every 6 (six) hours as needed for shortness of breath.  12/11/16  Yes Ophelia Shoulder, MD  ARIPiprazole (ABILIFY) 10 MG tablet Take 1 tablet (10 mg total) by mouth daily. 09/09/16  Yes Norman Herrlich, MD  cyclobenzaprine (FLEXERIL) 10 MG tablet Take 1 tablet (10 mg total) 2 (two) times daily as needed by mouth for muscle spasms. 08/07/17  Yes Domenic Moras, PA-C  diclofenac sodium (VOLTAREN) 1 % GEL Apply 2 g topically 4 (four) times daily. 02/14/17  Yes Ophelia Shoulder, MD  HYDROcodone-acetaminophen (NORCO/VICODIN) 5-325 MG tablet Take 2  tablets by mouth every 4 (four) hours as needed. 01/25/17  Yes Esaw Grandchild, MD  nicotine polacrilex (NICORETTE) 2 MG gum RX #3 Weeks 10-12: 1 piece every 4-8 hours. Max 24 pieces per day. 10/20/15  Yes Emokpae, Ejiroghene E, MD  olopatadine (PATANOL) 0.1 % ophthalmic solution Place 1 drop into both eyes 2 (two) times daily. 05/29/17 05/29/18 Yes Aldine Contes, MD  omeprazole (PRILOSEC) 20 MG capsule Take 1 capsule (20 mg total) by mouth daily. 02/14/17  Yes Ophelia Shoulder, MD  ondansetron (ZOFRAN-ODT) 4 MG disintegrating tablet Take 1 tablet (4 mg total) by mouth every 8 (eight) hours as needed for nausea or vomiting. 12/16/16  Yes Norman Herrlich, MD  predniSONE (DELTASONE) 20 MG tablet 2 tabs po daily x 4 days 08/07/17  Yes Domenic Moras, PA-C  traMADol (ULTRAM) 50 MG tablet Take 1 tablet (50 mg total) every 6 (six) hours as needed by mouth. 08/10/17  Yes Shary Decamp, PA-C  warfarin (COUMADIN) 5 MG tablet Take 3 tablets on Thursdays, Saturdays and Sundays; all other days--take 2&1/2 tablets. 08/25/17  Yes Pennie Banter, RPH-CPP   Past Medical History:  Diagnosis Date  . Anemia 2007    microcytic anemia, baseline hemoglobin 10-11, MCV at baseline 72-77, secondary to iron deficiency  . Arm DVT (deep venous thromboembolism), acute [facility])  September 23, 2009, January 05, 2010    Doppler study significant with indeterminant age DVT involving the  left upper extremity, Doppler performed January 05, 2010 consistent with acute DVT involving the left upper extremity  . Asthma   . Depression   . Leukopenia 2008    unclear etiology baseline WBC  2.8-3.7  . Lung nodule  June 10, 2008    stable tiny noduke noted along the minor fissure of the right lung on CT angio September 11, 09 -  stable for 2 years and consistent with benign disease  . Pulmonary embolism [facility])  September 07, 2005    her CT angiogram - positive for pulmonary emboli to several branches of the right lower lobe- relatively small clot burden,  clear lung; patient started on Coumadin; CT angiogram on January 10, 2006 showed resolution of previously seen pulmonary emboli with minimal basilar atelectasis  . Schizophrenia [facility])    Social History   Socioeconomic History  . Marital status: Married    Spouse name: Not on file  . Number of children: Not on file  . Years of education: Not on file  . Highest education level: Not on file  Social Needs  . Financial resource strain: Not on file  . Food insecurity - worry: Not on file  . Food insecurity - inability: Not on file  . Transportation needs - medical: Not on file  . Transportation needs - non-medical: Not on file  Occupational History  . Not on file  Tobacco Use  . Smoking status: Current Every Day Smoker    Packs/day: 0.25    Years: 0.00    Pack years: 0.00    Types: Cigarettes  . Smokeless tobacco: Never Used  . Tobacco comment: ABOUT 6 CIGARETTES A DAY  Substance and Sexual Activity  . Alcohol use: Yes    Alcohol/week: 0.0 oz  . Drug use: Yes    Types: Marijuana  . Sexual activity: Yes    Birth control/protection: None    Comment: tubal  Other Topics Concern  . 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 Hypertension Mother   . Congestive Heart Failure Mother   . Diabetes Father   . Birth defects Maternal Aunt   . Birth defects Maternal Uncle   . Diabetes Paternal Grandmother   . Breast cancer Sister   . Breast cancer Sister     ASSESSMENT Recent Results: The most recent result is correlated with 97.5 mg per week: Lab Results  Component Value Date   INR 2.90 08/25/2017   INR 3.85 08/10/2017   INR 2.20 08/04/2017    Anticoagulation Dosing: Description   Take 2 & 1/2 tablets of your 5mg  peach-colored warfarin tablets on Mondays,Tuesdays,  Wednesdays and Fridays. All other days of the week--take three (3) tablets of your 5mg  peach-colored warfarin tablets by mouth once-daily at 6PM.     INR today: Therapeutic  PLAN Weekly dose was decreased by 3% to 95 mg per week  Patient Instructions  Patient instructed to take medications as defined in the Anti-coagulation Track section of this encounter.  Patient instructed to take today's dose.  Patient instructed to take 2 & 1/2 tablets of your 5mg  peach-colored warfarin tablets on Mondays,Tuesdays, Wednesdays and Fridays. All other days of the week--take three (3) tablets of your 5mg  peach-colored warfarin tablets by mouth once-daily at [facility]. Patient  verbalized understanding of these instructions.     Patient advised to contact clinic or seek medical attention if signs/symptoms of bleeding or thromboembolism occur.  Patient verbalized understanding by repeating back information and was advised to contact me if further medication-related questions arise. Patient was also provided an information handout.  Follow-up Return in 3 weeks (on 09/15/2017) for Follow up INR at [place], PharmD, CACP, CPP  15 minutes spent face-to-face with the patient during the encounter. 50% of time spent on education. 50% of time was spent on fingerstick point of care INR sample collection, processing, results determination, dose adjustment and documentation in [email or web address].

## 2017-08-25 NOTE — Progress Notes (Signed)
Office Visit Note   Patient: Michele Gillespie           Date of Birth: 02-03-1976           MRN: [id number] Visit Date: 08/25/2017              Requested by: Neva Seat, MD [address] [place], [place] [address] PCP: Neva Seat, MD   Assessment & Plan: Visit Diagnoses:  1. Trochanteric bursitis, right hip     Plan: Have her do IT band stretching exercises.  She will follow-up with Benjiman Core in 2 weeks.  Would suggest intra-articular injection of the right hip if her pain persists despite these conservative measures.  Follow-Up Instructions: Return in about 2 weeks (around 09/08/2017) for Benjiman Core PA-C.   Orders:  Orders Placed This Encounter  Procedures  . Large Joint Inj   No orders of the defined types were placed in this encounter.     Procedures: Large Joint Inj: R greater trochanter on 08/25/2017 2:28 PM Indications: pain Details: 22 G 1.5 in needle, lateral approach  Arthrogram: No  Medications: 3 mL lidocaine 1 %; 40 mg methylPREDNISolone acetate 40 MG/ML Outcome: tolerated well, no immediate complications Procedure, treatment alternatives, risks and benefits explained, specific risks discussed. Consent was given by the patient. Immediately prior to procedure a time out was called to verify the correct patient, procedure, equipment, support staff and site/side marked as required. Patient was prepped and draped in the usual sterile fashion.       Clinical Data: No additional findings.   Subjective: Chief Complaint  Patient presents with  . Right Hip - Pain, Follow-up    HPI Michele Gillespie is a 41 year old female who returns today after undergoing an MRI of her right hip without contrast on 08/16/2017.  She is here for review of the MRI which was ordered by Benjiman Core PA-C.  The MRI showed no focal chondral defects of the articular cartilage.  Did show an area of mild right iliopsoas bursitis.  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  She continues to have pain over the the right lateral hip when standing too long going up and down stairs.  Hurts to pull her right leg across her left leg to put on shoes.  She has pain with lying on the right hip at night.  Review of Systems   Objective: Vital Signs: LMP 08/10/2017   Physical Exam  Constitutional: She is oriented to person, place, and time. She appears well-developed and well-nourished. No distress.  Pulmonary/Chest: Effort normal.  Neurological: She is alert and oriented to person, place, and time.  Skin: She is not diaphoretic.  Psychiatric: She has a normal mood and affect.    Ortho Exam Bilateral hip she has good range of motion of both hips.  She has significant tenderness over the right trochanteric region with palpation.  Also tenderness down her IT band.  Specialty Comments:  No specialty comments available.  Imaging: No results found.   PMFS History: Patient Active Problem List   Diagnosis Date Noted  . Herpes zoster 05/12/2017  . Acute deep vein thrombosis (DVT) of brachial vein of left upper extremity ([place]) 12/16/2016  . Right leg pain 09/09/2016  . Depression 06/20/2016  . Preventative health care 06/20/2016  . Pelvic mass in female 06/20/2016  . Chest pain 02/15/2016  . Secondary amenorrhea 09/14/2014  . Allergic rhinitis with  Asthma. 09/14/2014  . GERD (gastroesophageal reflux disease) 09/14/2013  . Breast mass, right 09/14/2013  . Healthcare maintenance 09/14/2013  . Cervical mass 02/08/2013  . H/O cesarean section 12/21/2012  . Tobacco use disorder 06/22/2012  . Long term current use of anticoagulant 10/20/2010  . DEPRESSION, HX OF 05/16/2010  . Iron deficiency anemia 10/12/2006  . LEUKOCYTOPENIA NOS 10/12/2006  . [place], PRIMARY 10/12/2006   Past Medical History:  Diagnosis Date  . Anemia 2007    microcytic anemia, baseline  hemoglobin 10-11, MCV at baseline 72-77, secondary to iron deficiency  . Arm DVT (deep venous thromboembolism), acute [facility])  September 23, 2009, January 05, 2010    Doppler study significant with indeterminant age DVT involving the left upper extremity, Doppler performed January 05, 2010 consistent with acute DVT involving the left upper extremity  . Asthma   . Depression   . Leukopenia 2008    unclear etiology baseline WBC  2.8-3.7  . Lung nodule  June 10, 2008    stable tiny noduke noted along the minor fissure of the right lung on CT angio September 11, 09 -  stable for 2 years and consistent with benign disease  . Pulmonary embolism [facility])  September 07, 2005    her CT angiogram - positive for pulmonary emboli to several branches of the right lower lobe- relatively small clot burden, clear lung; patient started on Coumadin; CT angiogram on January 10, 2006 showed resolution of previously seen pulmonary emboli with minimal basilar atelectasis  . Schizophrenia ([place])     Family History  Problem Relation Age of Onset  . Hypertension Mother   . Congestive Heart Failure Mother   . Diabetes Father   . Birth defects Maternal Aunt   . Birth defects Maternal Uncle   . Diabetes Paternal Grandmother   . Breast cancer Sister   . Breast cancer Sister     Past Surgical History:  Procedure Laterality Date  . CESAREAN SECTION      History of 5 C-section  . EXPLORATORY LAPAROTOMY WITH ABDOMINAL MASS EXCISION  02/2005  . TUBAL LIGATION     Social History   Occupational History  . Not on file  Tobacco Use  . Smoking status: Current Every Day Smoker    Packs/day: 0.25    Years: 0.00    Pack years: 0.00    Types: Cigarettes  . Smokeless tobacco: Never Used  . Tobacco comment: ABOUT 6 CIGARETTES A DAY  Substance and Sexual Activity  . Alcohol use: Yes    Alcohol/week: 0.0 oz  . Drug use: Yes    Types: Marijuana  . Sexual activity: Yes    Birth control/protection: None    Comment: tubal

## 2017-08-25 NOTE — Patient Instructions (Signed)
Patient instructed to take medications as defined in the Anti-coagulation Track section of this encounter.  Patient instructed to take today's dose.  Patient instructed to take 2 & 1/2 tablets of your 5mg  peach-colored warfarin tablets on Mondays,Tuesdays, Wednesdays and Fridays. All other days of the week--take three (3) tablets of your 5mg  peach-colored warfarin tablets by mouth once-daily at [facility]. Patient verbalized understanding of these instructions.

## 2017-08-25 NOTE — Progress Notes (Signed)
I reviewed Dr. Gladstone Pih note.  Patient has a hypercoagulable state documented and is on coumadin.  INR on the high side of goal and coumadin dose decreased.

## 2017-09-11 ENCOUNTER — Ambulatory Visit (INDEPENDENT_AMBULATORY_CARE_PROVIDER_SITE_OTHER): Payer: Medicaid Other | Admitting: Surgery

## 2017-09-11 ENCOUNTER — Encounter (INDEPENDENT_AMBULATORY_CARE_PROVIDER_SITE_OTHER): Payer: Self-pay | Admitting: Surgery

## 2017-09-11 VITALS — BP 132/86 | HR 82 | Ht 64.5 in | Wt 205.0 lb

## 2017-09-11 DIAGNOSIS — M7061 Trochanteric bursitis, right hip: Secondary | ICD-10-CM

## 2017-09-11 DIAGNOSIS — M25551 Pain in right hip: Secondary | ICD-10-CM

## 2017-09-11 NOTE — Patient Instructions (Signed)
Do IT band stretching exercises that were given.

## 2017-09-11 NOTE — Progress Notes (Signed)
Office Visit Note   Patient: Michele Gillespie           Date of Birth: 1975-11-12           MRN: [id number] Visit Date: 09/11/2017              Requested by: Michele Seat, MD [address] [place], [place] [address] PCP: Michele Seat, MD   Assessment & Plan: Visit Diagnoses:  1. Trochanteric bursitis, right hip   2. Pain in right hip     Plan: Patient was given IT band stretching exercises to do. Recommend that she do these daily. Advised patient to pay close attention to her hip pain and if she notices worsening pain in the groin area she will let me know and I will plan to schedule diagnostic/therapeutic intra-articular hip injection with Dr. Ernestina Gillespie.  Follow-Up Instructions: Return in about 4 weeks (around 10/09/2017) for with Michele Gillespie.   Orders:  No orders of the defined types were placed in this encounter.  No orders of the defined types were placed in this encounter.     Procedures: No procedures performed   Clinical Data: No additional findings.   Subjective: Chief Complaint  Patient presents with  . Right Hip - Pain, Follow-up    HPI Patient returns for recheck of hip pain. Last office visit Michele Stabile PA performed Marcaine/Depo-Medrol greater bursa injection. Thinks that she may have had minimal improvement. Complaining of right buttock pain along with right lateral hip pain. Has continued to have pain in the right groin but states that the lateral hip is worse. No complaints of specific back pain or true lumbar radicular symptoms. Review of Systems   Objective: Vital Signs: BP 132/86 (BP Location: Left Arm, Patient Position: Sitting)   Pulse 82   Ht 5' 4.5" (1.638 m)   Wt 205 lb (93 kg)   BMI 34.64 kg/m   Physical Exam  Constitutional: She is oriented to person, place, and time. She appears well-developed. No distress.  Eyes: EOM are normal. Pupils are equal, round, and reactive to light.  Pulmonary/Chest: No respiratory distress.  Musculoskeletal:   Exam gait is somewhat antalgic. Negative logroll bilateral hips. No pain with hip flexion. She is moderate moderately tender over the right sciatic notch and right hip greater trochanter bursa. Neurologically intact. No focal motor deficits.  Neurological: She is alert and oriented to person, place, and time.  Psychiatric: She has a normal mood and affect.    Ortho Exam  Specialty Comments:  No specialty comments available.  Imaging: No results found.   PMFS History: Patient Active Problem List   Diagnosis Date Noted  . Herpes zoster 05/12/2017  . Acute deep vein thrombosis (DVT) of brachial vein of left upper extremity ([place]) 12/16/2016  . Right leg pain 09/09/2016  . Depression 06/20/2016  . Preventative health care 06/20/2016  . Pelvic mass in female 06/20/2016  . Chest pain 02/15/2016  . Secondary amenorrhea 09/14/2014  . Allergic rhinitis with Asthma. 09/14/2014  . GERD (gastroesophageal reflux disease) 09/14/2013  . Breast mass, right 09/14/2013  . Healthcare maintenance 09/14/2013  . Cervical mass 02/08/2013  . H/O cesarean section 12/21/2012  . Tobacco use disorder 06/22/2012  . Long term current use of anticoagulant 10/20/2010  . DEPRESSION, HX OF 05/16/2010  . Iron deficiency anemia 10/12/2006  . LEUKOCYTOPENIA NOS 10/12/2006  . [place], PRIMARY 10/12/2006   Past Medical History:  Diagnosis Date  . Anemia 2007    microcytic anemia,  baseline hemoglobin 10-11, MCV at baseline 72-77, secondary to iron deficiency  . Arm DVT (deep venous thromboembolism), acute [facility])  September 23, 2009, January 05, 2010    Doppler study significant with indeterminant age DVT involving the left upper extremity, Doppler performed January 05, 2010 consistent with acute DVT involving the left upper extremity  . Asthma   . Depression   . Leukopenia 2008    unclear etiology baseline WBC  2.8-3.7  . Lung nodule  June 10, 2008    stable tiny noduke noted along the minor  fissure of the right lung on CT angio September 11, 09 -  stable for 2 years and consistent with benign disease  . Pulmonary embolism [facility])  September 07, 2005    her CT angiogram - positive for pulmonary emboli to several branches of the right lower lobe- relatively small clot burden, clear lung; patient started on Coumadin; CT angiogram on January 10, 2006 showed resolution of previously seen pulmonary emboli with minimal basilar atelectasis  . Schizophrenia ([place])     Family History  Problem Relation Age of Onset  . Hypertension Mother   . Congestive Heart Failure Mother   . Diabetes Father   . Birth defects Maternal Aunt   . Birth defects Maternal Uncle   . Diabetes Paternal Grandmother   . Breast cancer Sister   . Breast cancer Sister     Past Surgical History:  Procedure Laterality Date  . CESAREAN SECTION      History of 5 C-section  . EXPLORATORY LAPAROTOMY WITH ABDOMINAL MASS EXCISION  02/2005  . TUBAL LIGATION     Social History   Occupational History  . Not on file  Tobacco Use  . Smoking status: Current Every Day Smoker    Packs/day: 0.25    Years: 0.00    Pack years: 0.00    Types: Cigarettes  . Smokeless tobacco: Never Used  . Tobacco comment: ABOUT 6 CIGARETTES A DAY  Substance and Sexual Activity  . Alcohol use: Yes    Alcohol/week: 0.0 oz  . Drug use: Yes    Types: Marijuana  . Sexual activity: Yes    Birth control/protection: None    Comment: tubal

## 2017-09-15 ENCOUNTER — Ambulatory Visit: Payer: Medicaid Other

## 2017-10-02 ENCOUNTER — Encounter: Payer: Medicaid Other | Admitting: Internal Medicine

## 2017-10-09 ENCOUNTER — Ambulatory Visit (INDEPENDENT_AMBULATORY_CARE_PROVIDER_SITE_OTHER): Payer: Medicaid Other | Admitting: Surgery

## 2017-10-17 ENCOUNTER — Ambulatory Visit (INDEPENDENT_AMBULATORY_CARE_PROVIDER_SITE_OTHER): Payer: Medicaid Other | Admitting: Pharmacist

## 2017-10-17 DIAGNOSIS — Z7901 Long term (current) use of anticoagulants: Secondary | ICD-10-CM | POA: Diagnosis not present

## 2017-10-17 DIAGNOSIS — Z5181 Encounter for therapeutic drug level monitoring: Secondary | ICD-10-CM

## 2017-10-17 DIAGNOSIS — D6859 Other primary thrombophilia: Secondary | ICD-10-CM

## 2017-10-17 LAB — POCT INR: INR: 3.3

## 2017-10-17 MED ORDER — WARFARIN SODIUM 5 MG PO TABS: ORAL_TABLET | ORAL | 2 refills | Status: DC

## 2017-10-17 NOTE — Progress Notes (Signed)
Anticoagulation Management Michele Gillespie is a 42 y.o. female who reports to the clinic for monitoring of warfarin treatment.    Indication: DVT , history of; Hypercoagulable state, primary; long term current use of anticoagulant. Duration: indefinite Supervising physician: Joni Reining  Anticoagulation Clinic Visit History: Patient does report signs/symptoms of bleeding or thromboembolism (See patient findings).  Other recent changes: No diet, medications, lifestyle changes.  Anticoagulation Episode Summary    Current INR goal:   2.0-3.0  TTR:   46.8 % (5.4 y)  Next INR check:   10/27/2017  INR from last check:   3.30! (10/17/2017)  Weekly max warfarin dose:     Target end date:   Indefinite  INR check location:   [facility]  Preferred lab:     Send INR reminders to:      Indications   HYPERCOAGULABLE STATE PRIMARY [D68.59] Long term current use of anticoagulant [Z79.01]       Comments:           No Known Allergies Prior to Admission medications   Medication Sig Start Date End Date Taking? Authorizing Provider  albuterol (PROVENTIL HFA) 108 (90 Base) MCG/ACT inhaler INHALE 2 PUFFS INTO THE LUNGS EVERY 6 HOURS AS NEEDED FOR WHEEZING. FOR SHORTNESS OF BREATH Patient taking differently: Inhale 2 puffs into the lungs every 6 (six) hours as needed for shortness of breath.  12/11/16  Yes Ophelia Shoulder, MD  ARIPiprazole (ABILIFY) 10 MG tablet Take 1 tablet (10 mg total) by mouth daily. 09/09/16  Yes Norman Herrlich, MD  nicotine polacrilex (NICORETTE) 2 MG gum RX #3 Weeks 10-12: 1 piece every 4-8 hours. Max 24 pieces per day. 10/20/15  Yes Emokpae, Ejiroghene E, MD  olopatadine (PATANOL) 0.1 % ophthalmic solution Place 1 drop into both eyes 2 (two) times daily. 05/29/17 05/29/18 Yes Aldine Contes, MD  omeprazole (PRILOSEC) 20 MG capsule Take 1 capsule (20 mg total) by mouth daily. 02/14/17  Yes Ophelia Shoulder, MD  warfarin (COUMADIN) 5 MG tablet Take 3 tablets on Thursdays. All  other days, take 2&1/2 tablets. 10/17/17  Yes Pennie Banter, RPH-CPP  acetaminophen (TYLENOL) 325 MG tablet Take 650 mg by mouth every 6 (six) hours as needed for mild pain.    [provider]  cyclobenzaprine (FLEXERIL) 10 MG tablet Take 1 tablet (10 mg total) 2 (two) times daily as needed by mouth for muscle spasms. Patient not taking: Reported on 10/17/2017 08/07/17   Domenic Moras, PA-C  diclofenac sodium (VOLTAREN) 1 % GEL Apply 2 g topically 4 (four) times daily. Patient not taking: Reported on 10/17/2017 02/14/17   Ophelia Shoulder, MD  HYDROcodone-acetaminophen (NORCO/VICODIN) 5-325 MG tablet Take 2 tablets by mouth every 4 (four) hours as needed. Patient not taking: Reported on 10/17/2017 01/25/17   Esaw Grandchild, MD  ondansetron (ZOFRAN-ODT) 4 MG disintegrating tablet Take 1 tablet (4 mg total) by mouth every 8 (eight) hours as needed for nausea or vomiting. Patient not taking: Reported on 10/17/2017 12/16/16   Norman Herrlich, MD  predniSONE (DELTASONE) 20 MG tablet 2 tabs po daily x 4 days Patient not taking: Reported on 10/17/2017 08/07/17   Domenic Moras, PA-C  traMADol (ULTRAM) 50 MG tablet Take 1 tablet (50 mg total) every 6 (six) hours as needed by mouth. Patient not taking: Reported on 10/17/2017 08/10/17   Shary Decamp, PA-C   Past Medical History:  Diagnosis Date  . Anemia 2007    microcytic anemia, baseline hemoglobin 10-11, MCV at baseline 72-77,  secondary to iron deficiency  . Arm DVT (deep venous thromboembolism), acute [facility])  September 23, 2009, January 05, 2010    Doppler study significant with indeterminant age DVT involving the left upper extremity, Doppler performed January 05, 2010 consistent with acute DVT involving the left upper extremity  . Asthma   . Depression   . Leukopenia 2008    unclear etiology baseline WBC  2.8-3.7  . Lung nodule  June 10, 2008    stable tiny noduke noted along the minor fissure of the right lung on CT angio September 11, 09 -  stable for 2  years and consistent with benign disease  . Pulmonary embolism [facility])  September 07, 2005    her CT angiogram - positive for pulmonary emboli to several branches of the right lower lobe- relatively small clot burden, clear lung; patient started on Coumadin; CT angiogram on January 10, 2006 showed resolution of previously seen pulmonary emboli with minimal basilar atelectasis  . Schizophrenia [facility])    Social History   Socioeconomic History  . Marital status: Married    Spouse name: Not on file  . Number of children: Not on file  . Years of education: Not on file  . Highest education level: Not on file  Social Needs  . Financial resource strain: Not on file  . Food insecurity - worry: Not on file  . Food insecurity - inability: Not on file  . Transportation needs - medical: Not on file  . Transportation needs - non-medical: Not on file  Occupational History  . Not on file  Tobacco Use  . Smoking status: Current Every Day Smoker    Packs/day: 0.25    Years: 0.00    Pack years: 0.00    Types: Cigarettes  . Smokeless tobacco: Never Used  . Tobacco comment: ABOUT 6 CIGARETTES A DAY  Substance and Sexual Activity  . Alcohol use: Yes    Alcohol/week: 0.0 oz  . Drug use: Yes    Types: Marijuana  . Sexual activity: Yes    Birth control/protection: None    Comment: tubal  Other Topics Concern  . 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 Hypertension Mother   . Congestive Heart Failure Mother   . Diabetes Father   . Birth defects Maternal Aunt   . Birth defects Maternal Uncle   . Diabetes Paternal Grandmother   . Breast cancer Sister   . Breast cancer Sister     ASSESSMENT Recent Results: The most recent result is correlated with 95 mg per week: Lab Results  Component Value  Date   INR 3.30 10/17/2017   INR 2.90 08/25/2017   INR 3.85 08/10/2017    Anticoagulation Dosing: Description   Take 2 & 1/2 tablets of your 5mg  peach-colored warfarin tablets on all days of the week--EXCEPT on Thursdays, take three (3) tablets of your 5mg  peach-colored warfarin tablets on Thursdays.      INR today: Supratherapeutic  PLAN Weekly dose was decreased by 5% to 90 mg per week  Patient Instructions  Patient instructed to take medications as defined in the Anti-coagulation Track section of this encounter.  Patient instructed to take today's dose.  Patient instructed to take 2 & 1/2 tablets of your 5mg  peach-colored  warfarin tablets on all days of the week--EXCEPT on Thursdays, take three (3) tablets of your 5mg  peach-colored warfarin tablets on Thursdays.  Patient verbalized understanding of these instructions.     Patient advised to contact clinic or seek medical attention if signs/symptoms of bleeding or thromboembolism occur.  Patient verbalized understanding by repeating back information and was advised to contact me if further medication-related questions arise. Patient was also provided an information handout.  Follow-up Return in 10 days (on 10/27/2017) for Follow up INR at 0930.  Pennie Banter, PharmD, CACP, CPP  15 minutes spent face-to-face with the patient during the encounter. 50% of time spent on education. 50% of time was spent on fingerstick point of care INR sample collection, processing, results determination, dose adjustment and documentation in [email or web address]

## 2017-10-17 NOTE — Patient Instructions (Signed)
Patient instructed to take medications as defined in the Anti-coagulation Track section of this encounter.  Patient instructed to take today's dose.  Patient instructed to take 2 & 1/2 tablets of your 5mg  peach-colored warfarin tablets on all days of the week--EXCEPT on Thursdays, take three (3) tablets of your 5mg  peach-colored warfarin tablets on Thursdays.  Patient verbalized understanding of these instructions.

## 2017-10-17 NOTE — Progress Notes (Signed)
INTERNAL MEDICINE TEACHING ATTENDING ADDENDUM - Michele Groves, DO Duration- indefinate, Indication- VTE, INR-  Lab Results  Component Value Date   INR 3.30 10/17/2017  Agree with pharmacy recommendations as outlined in their note.  Additionally discussed with Dr Elie Confer, her straining and blood streaked stools.  Suspect this is minor hemorrhoidal bleeding, Dr Elie Confer to provide instruction on reducing straining, increasing dietary fiber.  She will RTC if any worsening.

## 2017-11-20 ENCOUNTER — Encounter: Payer: Medicaid Other | Admitting: Internal Medicine

## 2017-11-20 NOTE — Progress Notes (Deleted)
   CC: ***  HPI:  [name] is a 42 y.o. F with PMHx listed below presenting for ***. Please see the A&P for the status of the patient's chronic medical problems.   Past Medical History:  Diagnosis Date  . Anemia 2007    microcytic anemia, baseline hemoglobin 10-11, MCV at baseline 72-77, secondary to iron deficiency  . Arm DVT (deep venous thromboembolism), acute [facility])  September 23, 2009, January 05, 2010    Doppler study significant with indeterminant age DVT involving the left upper extremity, Doppler performed January 05, 2010 consistent with acute DVT involving the left upper extremity  . Asthma   . Depression   . Leukopenia 2008    unclear etiology baseline WBC  2.8-3.7  . Lung nodule  June 10, 2008    stable tiny noduke noted along the minor fissure of the right lung on CT angio September 11, 09 -  stable for 2 years and consistent with benign disease  . Pulmonary embolism [facility])  September 07, 2005    her CT angiogram - positive for pulmonary emboli to several branches of the right lower lobe- relatively small clot burden, clear lung; patient started on Coumadin; CT angiogram on January 10, 2006 showed resolution of previously seen pulmonary emboli with minimal basilar atelectasis  . Schizophrenia ([place])    Review of Systems: Performed and all others negative.  Physical Exam:  There were no vitals filed for this visit. Physical Exam   Assessment & Plan:   See Encounters Tab for problem based charting.  Patient {GC/GE:[id number]::"discussed with","seen with"} Dr. {NAMES:[id number]::"Butcher","Granfortuna","E. Hoffman","Klima","Mullen","Narendra","Raines","Vincent"}

## 2017-11-20 NOTE — Assessment & Plan Note (Deleted)
Patient has been seen recently by orthopedics for her right leg and hip pain. She had been seen in the ED and Clinic (at which time leg pain believed to be referred from hip) previously for this pain. She was diagnosed with Trochanteric bursitis and given IT band stretching exercises to do and instructed to follow up in four week with plans to do joint injection if worsening pain. She has not yet follow up. - Continue Stretching exercises - Follow up with orthpedics

## 2017-11-21 ENCOUNTER — Ambulatory Visit (INDEPENDENT_AMBULATORY_CARE_PROVIDER_SITE_OTHER): Payer: Medicaid Other | Admitting: Internal Medicine

## 2017-11-21 ENCOUNTER — Encounter: Payer: Self-pay | Admitting: Internal Medicine

## 2017-11-21 DIAGNOSIS — I10 Essential (primary) hypertension: Secondary | ICD-10-CM | POA: Diagnosis not present

## 2017-11-21 DIAGNOSIS — Z0289 Encounter for other administrative examinations: Secondary | ICD-10-CM | POA: Diagnosis present

## 2017-11-21 DIAGNOSIS — F325 Major depressive disorder, single episode, in full remission: Secondary | ICD-10-CM

## 2017-11-21 DIAGNOSIS — F329 Major depressive disorder, single episode, unspecified: Secondary | ICD-10-CM

## 2017-11-21 MED ORDER — AMLODIPINE BESYLATE 5 MG PO TABS: 5.0000 mg | ORAL_TABLET | Freq: Every day | ORAL | 11 refills | Status: DC

## 2017-11-21 NOTE — Progress Notes (Signed)
Internal Medicine Clinic Attending  Case discussed with Dr. Blum at the time of the visit.  We reviewed the resident's history and exam and pertinent patient test results.  I agree with the assessment, diagnosis, and plan of care documented in the resident's note. 

## 2017-11-21 NOTE — Patient Instructions (Addendum)
Thank you for coming to the clinic today. It was a pleasure to see you.   Your blood pressure is elevated today, please start taking amlodipine. This medication can sometimes cause swelling in your ankles   I have provided your letter for the social security office today   FOLLOW-UP INSTRUCTIONS When: April 4 with Dr. Trilby Drummer ( please schedule this at the front desk)   For: blood pressure check  What to bring: all of your medication bottles   Please call our clinic if you have any questions or concerns, we may be able to help and keep you from a long and expensive emergency room wait. Our clinic and after hours phone number is [phone], there is always someone available.

## 2017-11-21 NOTE — Assessment & Plan Note (Signed)
Concerning rise in blood pressure today. She has not been on a hypertensive in the past but has had elevations in blood pressures with systolics 147-829.  No prior renin/ aldosterone testing in this system. She does have a headache, it began yesterday and she thinks that it is related to sinus pressure. She does not want to answer any more questions about the headache, only wants the note for social security completed.   - prescribed amlodipine 5 mg daily  - return to clinic in 1 month
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
## 2017-11-24 ENCOUNTER — Ambulatory Visit: Payer: Medicaid Other

## 2017-12-01 ENCOUNTER — Ambulatory Visit (INDEPENDENT_AMBULATORY_CARE_PROVIDER_SITE_OTHER): Payer: Medicaid Other | Admitting: Pharmacist

## 2017-12-01 DIAGNOSIS — Z7901 Long term (current) use of anticoagulants: Secondary | ICD-10-CM

## 2017-12-01 DIAGNOSIS — D6859 Other primary thrombophilia: Secondary | ICD-10-CM | POA: Diagnosis not present

## 2017-12-01 DIAGNOSIS — Z5181 Encounter for therapeutic drug level monitoring: Secondary | ICD-10-CM

## 2017-12-01 LAB — POCT INR: INR: 1.7

## 2017-12-01 NOTE — Patient Instructions (Signed)
Patient instructed to take medications as defined in the Anti-coagulation Track section of this encounter.  Patient instructed to take today's dose.  Patient instructed to take  2 & 1/2 tablets of your 5mg  peach-colored warfarin tablets on all days of the week--EXCEPT on Mondays and Thursdays, take three (3) tablets of your 5mg  peach-colored warfarin tablets on Mondays and Thursdays.  Patient verbalized understanding of these instructions.

## 2017-12-01 NOTE — Progress Notes (Signed)
Anticoagulation Management Michele Gillespie is a 42 y.o. female who reports to the clinic for monitoring of warfarin treatment.    Indication: Hypercoagulable state, primary [D68.59]; long term current use of anticoagulant [Z79.01].    Duration: indefinite   Supervising physician: Aldine Contes  Anticoagulation Clinic Visit History: Patient does not report signs/symptoms of bleeding or thromboembolism  Other recent changes: No diet, medications, lifestyle changes endorsed by patient at this visit.  Anticoagulation Episode Summary    Current INR goal:   2.0-3.0  TTR:   47.2 % (5.5 y)  Next INR check:   10/27/2017  INR from last check:   1.70! (12/01/2017)  Weekly max warfarin dose:     Target end date:   Indefinite  INR check location:   [facility]  Preferred lab:     Send INR reminders to:      Indications   HYPERCOAGULABLE STATE PRIMARY [D68.59] Long term current use of anticoagulant [Z79.01]       Comments:           No Known Allergies Prior to Admission medications   Medication Sig Start Date End Date Taking? Authorizing Provider  acetaminophen (TYLENOL) 325 MG tablet Take 650 mg by mouth every 6 (six) hours as needed for mild pain.   Yes [provider]  albuterol (PROVENTIL HFA) 108 (90 Base) MCG/ACT inhaler INHALE 2 PUFFS INTO THE LUNGS EVERY 6 HOURS AS NEEDED FOR WHEEZING. FOR SHORTNESS OF BREATH Patient taking differently: Inhale 2 puffs into the lungs every 6 (six) hours as needed for shortness of breath.  12/11/16  Yes Ophelia Shoulder, MD  amLODipine (NORVASC) 5 MG tablet Take 1 tablet (5 mg total) by mouth daily. 11/21/17 11/21/18 Yes Ledell Noss, MD  ARIPiprazole (ABILIFY) 10 MG tablet Take 1 tablet (10 mg total) by mouth daily. 09/09/16  Yes Norman Herrlich, MD  cyclobenzaprine (FLEXERIL) 10 MG tablet Take 1 tablet (10 mg total) 2 (two) times daily as needed by mouth for muscle spasms. 08/07/17  Yes Domenic Moras, PA-C  diclofenac sodium (VOLTAREN) 1 % GEL  Apply 2 g topically 4 (four) times daily. 02/14/17  Yes Ophelia Shoulder, MD  HYDROcodone-acetaminophen (NORCO/VICODIN) 5-325 MG tablet Take 2 tablets by mouth every 4 (four) hours as needed. 01/25/17  Yes Esaw Grandchild, MD  nicotine polacrilex (NICORETTE) 2 MG gum RX #3 Weeks 10-12: 1 piece every 4-8 hours. Max 24 pieces per day. 10/20/15  Yes Emokpae, Ejiroghene E, MD  olopatadine (PATANOL) 0.1 % ophthalmic solution Place 1 drop into both eyes 2 (two) times daily. 05/29/17 05/29/18 Yes Aldine Contes, MD  omeprazole (PRILOSEC) 20 MG capsule Take 1 capsule (20 mg total) by mouth daily. 02/14/17  Yes Ophelia Shoulder, MD  ondansetron (ZOFRAN-ODT) 4 MG disintegrating tablet Take 1 tablet (4 mg total) by mouth every 8 (eight) hours as needed for nausea or vomiting. 12/16/16  Yes Norman Herrlich, MD  predniSONE (DELTASONE) 20 MG tablet 2 tabs po daily x 4 days 08/07/17  Yes Domenic Moras, PA-C  traMADol (ULTRAM) 50 MG tablet Take 1 tablet (50 mg total) every 6 (six) hours as needed by mouth. 08/10/17  Yes Shary Decamp, PA-C  warfarin (COUMADIN) 5 MG tablet Take 3 tablets on Thursdays. All other days, take 2&1/2 tablets. 10/17/17  Yes Pennie Banter, RPH-CPP   Past Medical History:  Diagnosis Date  . Anemia 2007    microcytic anemia, baseline hemoglobin 10-11, MCV at baseline 72-77, secondary to iron deficiency  . Arm DVT (  deep venous thromboembolism), acute [facility])  September 23, 2009, January 05, 2010    Doppler study significant with indeterminant age DVT involving the left upper extremity, Doppler performed January 05, 2010 consistent with acute DVT involving the left upper extremity  . Asthma   . Depression   . Leukopenia 2008    unclear etiology baseline WBC  2.8-3.7  . Lung nodule  June 10, 2008    stable tiny noduke noted along the minor fissure of the right lung on CT angio September 11, 09 -  stable for 2 years and consistent with benign disease  . Pulmonary embolism [facility])  September 07, 2005    her CT  angiogram - positive for pulmonary emboli to several branches of the right lower lobe- relatively small clot burden, clear lung; patient started on Coumadin; CT angiogram on January 10, 2006 showed resolution of previously seen pulmonary emboli with minimal basilar atelectasis  . Schizophrenia [facility])    Social History   Socioeconomic History  . Marital status: Married    Spouse name: Not on file  . Number of children: Not on file  . Years of education: Not on file  . Highest education level: Not on file  Social Needs  . Financial resource strain: Not on file  . Food insecurity - worry: Not on file  . Food insecurity - inability: Not on file  . Transportation needs - medical: Not on file  . Transportation needs - non-medical: Not on file  Occupational History  . Not on file  Tobacco Use  . Smoking status: Current Every Day Smoker    Packs/day: 0.25    Years: 0.00    Pack years: 0.00    Types: Cigarettes  . Smokeless tobacco: Never Used  . Tobacco comment: ABOUT 6 CIGARETTES A DAY  Substance and Sexual Activity  . Alcohol use: Yes    Alcohol/week: 0.0 oz  . Drug use: Yes    Types: Marijuana  . Sexual activity: Yes    Birth control/protection: None    Comment: tubal  Other Topics Concern  . 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 Hypertension Mother   . Congestive Heart Failure Mother   . Diabetes Father   . Birth defects Maternal Aunt   . Birth defects Maternal Uncle   . Diabetes Paternal Grandmother   . Breast cancer Sister   . Breast cancer Sister     ASSESSMENT Recent Results: The most recent result is correlated with 90 mg per week: Lab Results  Component Value Date   INR 1.70 12/01/2017   INR 3.30 10/17/2017   INR 2.90 08/25/2017    Anticoagulation  Dosing: Description   Take 2 & 1/2 tablets of your 5mg  peach-colored warfarin tablets on all days of the week--EXCEPT on Mondays and Thursdays, take three (3) tablets of your 5mg  peach-colored warfarin tablets on Mondays and Thursdays.      INR today: Subtherapeutic  PLAN Weekly dose was increased by 3% to 92.5 mg per week  Patient Instructions  Patient instructed to take medications as defined in the Anti-coagulation Track section of this encounter.  Patient instructed to take today's dose.  Patient instructed to take  2 & 1/2 tablets of your 5mg  peach-colored warfarin tablets on  all days of the week--EXCEPT on Mondays and Thursdays, take three (3) tablets of your 5mg  peach-colored warfarin tablets on Mondays and Thursdays.  Patient verbalized understanding of these instructions.     Patient advised to contact clinic or seek medical attention if signs/symptoms of bleeding or thromboembolism occur.  Patient verbalized understanding by repeating back information and was advised to contact me if further medication-related questions arise. Patient was also provided an information handout.  Follow-up Return in 3 weeks (on 12/22/2017) for Follow up INR at 1100.  Pennie Banter, PharmD, CACP, CPP  15 minutes spent face-to-face with the patient during the encounter. 50% of time spent on education. 50% of time was spent on fingerstick point of care INR sample collection. Processing, results determination, dose adjustment and documentation in [email or web address].

## 2017-12-02 NOTE — Progress Notes (Signed)
INTERNAL MEDICINE TEACHING ATTENDING ADDENDUM - Edrei Norgaard M.D  Duration-indefinite, Indication-PE, right upper extremity DVT, INR-subtherapeutic. Agree with pharmacy recommendations as outlined in their note.

## 2017-12-18 ENCOUNTER — Other Ambulatory Visit: Payer: Self-pay

## 2017-12-18 ENCOUNTER — Encounter (HOSPITAL_COMMUNITY): Payer: Self-pay | Admitting: Emergency Medicine

## 2017-12-18 ENCOUNTER — Emergency Department (HOSPITAL_COMMUNITY): Payer: Medicaid Other

## 2017-12-18 DIAGNOSIS — R079 Chest pain, unspecified: Secondary | ICD-10-CM | POA: Insufficient documentation

## 2017-12-18 DIAGNOSIS — Z5321 Procedure and treatment not carried out due to patient leaving prior to being seen by health care provider: Secondary | ICD-10-CM | POA: Diagnosis not present

## 2017-12-18 DIAGNOSIS — R51 Headache: Secondary | ICD-10-CM | POA: Insufficient documentation

## 2017-12-18 LAB — PROTIME-INR
INR: 1.67
PROTHROMBIN TIME: 19.6 s — AB (ref 11.4–15.2)

## 2017-12-18 LAB — CBC
HEMATOCRIT: 42.5 % (ref 36.0–46.0)
HEMOGLOBIN: 14 g/dL (ref 12.0–15.0)
MCH: 26 pg (ref 26.0–34.0)
MCHC: 32.9 g/dL (ref 30.0–36.0)
MCV: 78.8 fL (ref 78.0–100.0)
PLATELETS: 179 10*3/uL (ref 150–400)
RBC: 5.39 MIL/uL — AB (ref 3.87–5.11)
RDW: 14.2 % (ref 11.5–15.5)
WBC: 3.5 10*3/uL — AB (ref 4.0–10.5)

## 2017-12-18 LAB — BASIC METABOLIC PANEL
ANION GAP: 8 (ref 5–15)
BUN: 17 mg/dL (ref 6–20)
CO2: 22 mmol/L (ref 22–32)
CREATININE: 1.03 mg/dL — AB (ref 0.44–1.00)
Calcium: 9.1 mg/dL (ref 8.9–10.3)
Chloride: 109 mmol/L (ref 101–111)
GFR calc non Af Amer: >60 mL/min (ref >60)
Glucose, Bld: 81 mg/dL (ref 65–99)
POTASSIUM: 3.7 mmol/L (ref 3.5–5.1)
SODIUM: 139 mmol/L (ref 135–145)

## 2017-12-18 LAB — I-STAT BETA HCG BLOOD, ED (MC, WL, AP ONLY): HCG, QUANTITATIVE: 9.9 m[IU]/mL — AB (ref <5)

## 2017-12-18 LAB — I-STAT TROPONIN, ED: Troponin i, poc: 0 ng/mL (ref 0.00–0.08)

## 2017-12-18 NOTE — ED Notes (Signed)
Pt outside smoking

## 2017-12-18 NOTE — ED Triage Notes (Signed)
Pt was exposed to the flu and reports she now has chest pain, headache, back pain, general body aches.  Denies fever, n/v/dizziness, sob.  Pt reports she feels hot in triage, no fever, no diaphoresis.

## 2017-12-19 ENCOUNTER — Emergency Department (HOSPITAL_COMMUNITY)
Admission: EM | Admit: 2017-12-19 | Discharge: 2017-12-19 | Disposition: A | Discharge status: Home / Self Care | Payer: Medicaid Other | Attending: Emergency Medicine | Admitting: Emergency Medicine

## 2017-12-19 HISTORY — DX: Essential (primary) hypertension: I10

## 2017-12-19 NOTE — ED Notes (Signed)
Pt called for room, no answer in lobby 

## 2017-12-19 NOTE — ED Notes (Signed)
Pt called to go back to a room, no answer in lobby.

## 2017-12-19 NOTE — ED Notes (Signed)
12/19/2017, Follow-up call completed.

## 2017-12-19 NOTE — ED Notes (Signed)
12/19/2017, Attempted Follow-up call. No answer, phone rang busy.

## 2017-12-22 ENCOUNTER — Ambulatory Visit: Payer: Medicaid Other

## 2018-01-05 ENCOUNTER — Ambulatory Visit (INDEPENDENT_AMBULATORY_CARE_PROVIDER_SITE_OTHER): Payer: Medicaid Other | Admitting: Pharmacist

## 2018-01-05 DIAGNOSIS — Z7901 Long term (current) use of anticoagulants: Secondary | ICD-10-CM

## 2018-01-05 DIAGNOSIS — Z5181 Encounter for therapeutic drug level monitoring: Secondary | ICD-10-CM

## 2018-01-05 DIAGNOSIS — D6859 Other primary thrombophilia: Secondary | ICD-10-CM

## 2018-01-05 LAB — POCT INR: INR: 2.9

## 2018-01-05 NOTE — Patient Instructions (Signed)
Patient instructed to take medications as defined in the Anti-coagulation Track section of this encounter.  Patient instructed to take today's dose.  Patient instructed to take  2 & 1/2 tablets of your 5mg  peach-colored warfarin tablets on all days of the week.  Patient verbalized understanding of these instructions.

## 2018-01-05 NOTE — Progress Notes (Signed)
Anticoagulation Management Michele Gillespie is a 42 y.o. female who reports to the clinic for monitoring of warfarin treatment.    Indication: Hypercoagulable State, Primary[D68.59], Long term current use of anticoagulant [Z79.01].    Duration: indefinite Supervising physician: Joni Reining  Anticoagulation Clinic Visit History: Patient does not report signs/symptoms of bleeding or thromboembolism  Other recent changes: No diet, medications, lifestyle Anticoagulation Episode Summary    Current INR goal:   2.0-3.0  TTR:   47.0 % (5.6 y)  Next INR check:   02/02/2018  INR from last check:   2.90 (01/05/2018)  Weekly max warfarin dose:     Target end date:   Indefinite  INR check location:   Anticoagulation Clinic  Preferred lab:     Send INR reminders to:      Indications   HYPERCOAGULABLE STATE PRIMARY [D68.59] Long term current use of anticoagulant [Z79.01]       Comments:           No Known Allergies Prior to Admission medications   Medication Sig Start Date End Date Taking? Authorizing Provider  acetaminophen (TYLENOL) 325 MG tablet Take 650 mg by mouth every 6 (six) hours as needed for mild pain.   Yes [provider]  albuterol (PROVENTIL HFA) 108 (90 Base) MCG/ACT inhaler INHALE 2 PUFFS INTO THE LUNGS EVERY 6 HOURS AS NEEDED FOR WHEEZING. FOR SHORTNESS OF BREATH Patient taking differently: Inhale 2 puffs into the lungs every 6 (six) hours as needed for shortness of breath.  12/11/16  Yes Ophelia Shoulder, MD  amLODipine (NORVASC) 5 MG tablet Take 1 tablet (5 mg total) by mouth daily. 11/21/17 11/21/18 Yes Ledell Noss, MD  ARIPiprazole (ABILIFY) 10 MG tablet Take 1 tablet (10 mg total) by mouth daily. 09/09/16  Yes Norman Herrlich, MD  cyclobenzaprine (FLEXERIL) 10 MG tablet Take 1 tablet (10 mg total) 2 (two) times daily as needed by mouth for muscle spasms. 08/07/17  Yes Domenic Moras, PA-C  diclofenac sodium (VOLTAREN) 1 % GEL Apply 2 g topically 4 (four) times daily.  02/14/17  Yes Ophelia Shoulder, MD  HYDROcodone-acetaminophen (NORCO/VICODIN) 5-325 MG tablet Take 2 tablets by mouth every 4 (four) hours as needed. 01/25/17  Yes Esaw Grandchild, MD  nicotine polacrilex (NICORETTE) 2 MG gum RX #3 Weeks 10-12: 1 piece every 4-8 hours. Max 24 pieces per day. 10/20/15  Yes Emokpae, Ejiroghene E, MD  olopatadine (PATANOL) 0.1 % ophthalmic solution Place 1 drop into both eyes 2 (two) times daily. 05/29/17 05/29/18 Yes Aldine Contes, MD  omeprazole (PRILOSEC) 20 MG capsule Take 1 capsule (20 mg total) by mouth daily. 02/14/17  Yes Ophelia Shoulder, MD  ondansetron (ZOFRAN-ODT) 4 MG disintegrating tablet Take 1 tablet (4 mg total) by mouth every 8 (eight) hours as needed for nausea or vomiting. 12/16/16  Yes Norman Herrlich, MD  predniSONE (DELTASONE) 20 MG tablet 2 tabs po daily x 4 days 08/07/17  Yes Domenic Moras, PA-C  traMADol (ULTRAM) 50 MG tablet Take 1 tablet (50 mg total) every 6 (six) hours as needed by mouth. 08/10/17  Yes Shary Decamp, PA-C  warfarin (COUMADIN) 5 MG tablet Take 3 tablets on Thursdays. All other days, take 2&1/2 tablets. 10/17/17  Yes Pennie Banter, RPH-CPP   Past Medical History:  Diagnosis Date  . Anemia 2007    microcytic anemia, baseline hemoglobin 10-11, MCV at baseline 72-77, secondary to iron deficiency  . Arm DVT (deep venous thromboembolism), acute [facility])  September 23, 2009, January 05, 2010    Doppler study significant with indeterminant age DVT involving the left upper extremity, Doppler performed January 05, 2010 consistent with acute DVT involving the left upper extremity  . Asthma   . Depression   . Hypertension   . Leukopenia 2008    unclear etiology baseline WBC  2.8-3.7  . Lung nodule  June 10, 2008    stable tiny noduke noted along the minor fissure of the right lung on CT angio September 11, 09 -  stable for 2 years and consistent with benign disease  . Pulmonary embolism [facility])  September 07, 2005    her CT angiogram - positive for  pulmonary emboli to several branches of the right lower lobe- relatively small clot burden, clear lung; patient started on Coumadin; CT angiogram on January 10, 2006 showed resolution of previously seen pulmonary emboli with minimal basilar atelectasis  . Schizophrenia [facility])    Social History   Socioeconomic History  . Marital status: Married    Spouse name: Not on file  . Number of children: Not on file  . Years of education: Not on file  . Highest education level: Not on file  Occupational History  . Not on file  Social Needs  . Financial resource strain: Not on file  . Food insecurity:    Worry: Not on file    Inability: Not on file  . Transportation needs:    Medical: Not on file    Non-medical: Not on file  Tobacco Use  . Smoking status: Current Every Day Smoker    Packs/day: 0.25    Years: 0.00    Pack years: 0.00    Types: Cigarettes  . Smokeless tobacco: Never Used  . Tobacco comment: ABOUT 6 CIGARETTES A DAY  Substance and Sexual Activity  . Alcohol use: Yes    Alcohol/week: 0.0 oz  . Drug use: Yes    Types: Marijuana  . Sexual activity: Yes    Birth control/protection: None    Comment: tubal  Lifestyle  . Physical activity:    Days per week: Not on file    Minutes per session: Not on file  . Stress: Not on file  Relationships  . Social connections:    Talks on phone: Not on file    Gets together: Not on file    Attends religious service: Not on file    Active member of club or organization: Not on file    Attends meetings of clubs or organizations: Not on file    Relationship status: Not on file  Other Topics Concern  . 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 Hypertension Mother   . Congestive Heart Failure Mother   . Diabetes Father   . Birth  defects Maternal Aunt   . Birth defects Maternal Uncle   . Diabetes Paternal Grandmother   . Breast cancer Sister   . Breast cancer Sister     ASSESSMENT Recent Results: The most recent result is correlated with 92.5 mg per week: Lab Results  Component Value Date   INR 2.90 01/05/2018   INR 1.67 12/18/2017   INR 1.70 12/01/2017    Anticoagulation Dosing: Description   Take 2 & 1/2 tablets of your 5mg  peach-colored warfarin tablets on  all days of the week.      INR today: Therapeutic  PLAN Weekly dose was decreased by 5.4 % to 87.5 mg per week  Patient Instructions  Patient instructed to take medications as defined in the Anti-coagulation Track section of this encounter.  Patient instructed to take today's dose.  Patient instructed to take  2 & 1/2 tablets of your 5mg  peach-colored warfarin tablets on all days of the week.  Patient verbalized understanding of these instructions.     Patient advised to contact clinic or seek medical attention if signs/symptoms of bleeding or thromboembolism occur.  Patient verbalized understanding by repeating back information and was advised to contact me if further medication-related questions arise. Patient was also provided an information handout.  Follow-up Return in 1 month (on 02/02/2018) for Follow up INR at 0900.  Pennie Banter, PharmD, CACP, CPP  15 minutes spent face-to-face with the patient during the encounter. 50% of time spent on education. 50% of time was spent on fingerstick point of care INR sample collection, processing, results determination, dose adjustment and calculation with documentation in [email or web address].

## 2018-01-13 NOTE — Progress Notes (Signed)
INTERNAL MEDICINE TEACHING ATTENDING ADDENDUM - Michele Groves, DO Duration- indefinate, Indication- VTE hypercoaguable, INR-  Lab Results  Component Value Date   INR 2.90 01/05/2018  . Agree with pharmacy recommendations as outlined in their note.

## 2018-01-28 ENCOUNTER — Other Ambulatory Visit: Payer: Self-pay | Admitting: *Deleted

## 2018-01-28 DIAGNOSIS — D6859 Other primary thrombophilia: Secondary | ICD-10-CM

## 2018-01-28 DIAGNOSIS — Z7901 Long term (current) use of anticoagulants: Secondary | ICD-10-CM

## 2018-01-28 MED ORDER — OMEPRAZOLE 20 MG PO CPDR: 20.0000 mg | DELAYED_RELEASE_CAPSULE | Freq: Every day | ORAL | 3 refills | Status: DC

## 2018-01-28 MED ORDER — WARFARIN SODIUM 5 MG PO TABS: ORAL_TABLET | ORAL | 2 refills | Status: DC

## 2018-01-28 NOTE — Telephone Encounter (Signed)
Refills Approved 

## 2018-02-02 ENCOUNTER — Ambulatory Visit (INDEPENDENT_AMBULATORY_CARE_PROVIDER_SITE_OTHER): Payer: Medicaid Other | Admitting: Pharmacist

## 2018-02-02 DIAGNOSIS — D6859 Other primary thrombophilia: Secondary | ICD-10-CM | POA: Diagnosis not present

## 2018-02-02 DIAGNOSIS — Z7901 Long term (current) use of anticoagulants: Secondary | ICD-10-CM | POA: Diagnosis not present

## 2018-02-02 DIAGNOSIS — Z5181 Encounter for therapeutic drug level monitoring: Secondary | ICD-10-CM | POA: Diagnosis not present

## 2018-02-02 LAB — POCT INR: INR: 2.1

## 2018-02-02 NOTE — Patient Instructions (Signed)
Patient instructed to take medications as defined in the Anti-coagulation Track section of this encounter.  Patient instructed to take today's dose.  Patient instructed to take  three (3) tablets of your 5mg  peach-colored warfarin tablets by mouth, once-daily at [facility] each day--EXCEPT on Saturdays and Sundays--on these days, take ONLY 2 & 1/2 tablets.  Patient verbalized understanding of these instructions.

## 2018-02-02 NOTE — Progress Notes (Signed)
Anticoagulation Management Michele Gillespie is a 42 y.o. female who reports to the clinic for monitoring of warfarin treatment.    Indication: Hypercoagulable state, primary; long term current use of anticoagulants.   Duration: indefinite Supervising physician: Aldine Contes  Anticoagulation Clinic Visit History: Patient does not report signs/symptoms of bleeding or thromboembolism  Other recent changes: No diet, medications, lifestyle changes endorsed by the patient to me at this visit.  Anticoagulation Episode Summary    Current INR goal:   2.0-3.0  TTR:   47.7 % (5.7 y)  Next INR check:   03/02/2018  INR from last check:   2.10 (02/02/2018)  Weekly max warfarin dose:     Target end date:   Indefinite  INR check location:   Anticoagulation Clinic  Preferred lab:     Send INR reminders to:      Indications   HYPERCOAGULABLE STATE PRIMARY [D68.59] Long term current use of anticoagulant [Z79.01]       Comments:           No Known Allergies Prior to Admission medications   Medication Sig Start Date End Date Taking? Authorizing Provider  acetaminophen (TYLENOL) 325 MG tablet Take 650 mg by mouth every 6 (six) hours as needed for mild pain.   Yes [provider]  albuterol (PROVENTIL HFA) 108 (90 Base) MCG/ACT inhaler INHALE 2 PUFFS INTO THE LUNGS EVERY 6 HOURS AS NEEDED FOR WHEEZING. FOR SHORTNESS OF BREATH Patient taking differently: Inhale 2 puffs into the lungs every 6 (six) hours as needed for shortness of breath.  12/11/16  Yes Ophelia Shoulder, MD  amLODipine (NORVASC) 5 MG tablet Take 1 tablet (5 mg total) by mouth daily. 11/21/17 11/21/18 Yes Ledell Noss, MD  ARIPiprazole (ABILIFY) 10 MG tablet Take 1 tablet (10 mg total) by mouth daily. 09/09/16  Yes Norman Herrlich, MD  cyclobenzaprine (FLEXERIL) 10 MG tablet Take 1 tablet (10 mg total) 2 (two) times daily as needed by mouth for muscle spasms. 08/07/17  Yes Domenic Moras, PA-C  diclofenac sodium (VOLTAREN) 1 % GEL Apply 2  g topically 4 (four) times daily. 02/14/17  Yes Ophelia Shoulder, MD  HYDROcodone-acetaminophen (NORCO/VICODIN) 5-325 MG tablet Take 2 tablets by mouth every 4 (four) hours as needed. 01/25/17  Yes Esaw Grandchild, MD  nicotine polacrilex (NICORETTE) 2 MG gum RX #3 Weeks 10-12: 1 piece every 4-8 hours. Max 24 pieces per day. 10/20/15  Yes Emokpae, Ejiroghene E, MD  olopatadine (PATANOL) 0.1 % ophthalmic solution Place 1 drop into both eyes 2 (two) times daily. 05/29/17 05/29/18 Yes Aldine Contes, MD  omeprazole (PRILOSEC) 20 MG capsule Take 1 capsule (20 mg total) by mouth daily. 01/28/18  Yes Neva Seat, MD  ondansetron (ZOFRAN-ODT) 4 MG disintegrating tablet Take 1 tablet (4 mg total) by mouth every 8 (eight) hours as needed for nausea or vomiting. 12/16/16  Yes Norman Herrlich, MD  predniSONE (DELTASONE) 20 MG tablet 2 tabs po daily x 4 days 08/07/17  Yes Domenic Moras, PA-C  traMADol (ULTRAM) 50 MG tablet Take 1 tablet (50 mg total) every 6 (six) hours as needed by mouth. 08/10/17  Yes Shary Decamp, PA-C  warfarin (COUMADIN) 5 MG tablet Take 3 tablets on Thursdays. All other days, take 2&1/2 tablets. 01/28/18  Yes Neva Seat, MD   Past Medical History:  Diagnosis Date  . Anemia 2007    microcytic anemia, baseline hemoglobin 10-11, MCV at baseline 72-77, secondary to iron deficiency  . Arm DVT (deep venous thromboembolism),  acute [facility])  September 23, 2009, January 05, 2010    Doppler study significant with indeterminant age DVT involving the left upper extremity, Doppler performed January 05, 2010 consistent with acute DVT involving the left upper extremity  . Asthma   . Depression   . Hypertension   . Leukopenia 2008    unclear etiology baseline WBC  2.8-3.7  . Lung nodule  June 10, 2008    stable tiny noduke noted along the minor fissure of the right lung on CT angio September 11, 09 -  stable for 2 years and consistent with benign disease  . Pulmonary embolism [facility])  September 07, 2005     her CT angiogram - positive for pulmonary emboli to several branches of the right lower lobe- relatively small clot burden, clear lung; patient started on Coumadin; CT angiogram on January 10, 2006 showed resolution of previously seen pulmonary emboli with minimal basilar atelectasis  . Schizophrenia [facility])    Social History   Socioeconomic History  . Marital status: Married    Spouse name: Not on file  . Number of children: Not on file  . Years of education: Not on file  . Highest education level: Not on file  Occupational History  . Not on file  Social Needs  . Financial resource strain: Not on file  . Food insecurity:    Worry: Not on file    Inability: Not on file  . Transportation needs:    Medical: Not on file    Non-medical: Not on file  Tobacco Use  . Smoking status: Current Every Day Smoker    Packs/day: 0.25    Years: 0.00    Pack years: 0.00    Types: Cigarettes  . Smokeless tobacco: Never Used  . Tobacco comment: ABOUT 6 CIGARETTES A DAY  Substance and Sexual Activity  . Alcohol use: Yes    Alcohol/week: 0.0 oz  . Drug use: Yes    Types: Marijuana  . Sexual activity: Yes    Birth control/protection: None    Comment: tubal  Lifestyle  . Physical activity:    Days per week: Not on file    Minutes per session: Not on file  . Stress: Not on file  Relationships  . Social connections:    Talks on phone: Not on file    Gets together: Not on file    Attends religious service: Not on file    Active member of club or organization: Not on file    Attends meetings of clubs or organizations: Not on file    Relationship status: Not on file  Other Topics Concern  . 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 Hypertension Mother   . Congestive Heart Failure Mother    . Diabetes Father   . Birth defects Maternal Aunt   . Birth defects Maternal Uncle   . Diabetes Paternal Grandmother   . Breast cancer Sister   . Breast cancer Sister     ASSESSMENT Recent Results: The most recent result is correlated with 92.5 mg per week: Lab Results  Component Value Date   INR 2.10 02/02/2018   INR 2.90 01/05/2018   INR 1.67 12/18/2017    Anticoagulation Dosing: Description   Take three (3) tablets  of your 5mg  peach-colored warfarin tablets by mouth, once-daily at [facility] each day--EXCEPT on Saturdays and Sundays--on these days, take ONLY 2 & 1/2 tablets.      INR today: Therapeutic  PLAN Weekly dose was increased by 14% to 100 mg per week  Patient Instructions  Patient instructed to take medications as defined in the Anti-coagulation Track section of this encounter.  Patient instructed to take today's dose.  Patient instructed to take  three (3) tablets of your 5mg  peach-colored warfarin tablets by mouth, once-daily at [facility] each day--EXCEPT on Saturdays and Sundays--on these days, take ONLY 2 & 1/2 tablets.  Patient verbalized understanding of these instructions.     Patient advised to contact clinic or seek medical attention if signs/symptoms of bleeding or thromboembolism occur.  Patient verbalized understanding by repeating back information and was advised to contact me if further medication-related questions arise. Patient was also provided an information handout.  Follow-up Return in about 1 month (around 03/02/2018) for Follow up INR at [place], PharmD, CACP, CPP  15 minutes spent face-to-face with the patient during the encounter. 50% of time spent on education. 50% of time was spent on fingerstick point of care INR sample collection, processing, results determination, dose adjustment and documentation in [email or web address].

## 2018-02-05 NOTE — Progress Notes (Signed)
INTERNAL MEDICINE TEACHING ATTENDING ADDENDUM - Jaking Thayer M.D  °Duration- indefinite, Indication- DVT, PE, INR- therapeutic. Agree with pharmacy recommendations as outlined in their note.  ° ° ° °

## 2018-02-12 ENCOUNTER — Other Ambulatory Visit: Payer: Self-pay | Admitting: *Deleted

## 2018-02-17 MED ORDER — OLOPATADINE HCL 0.1 % OP SOLN: 1.0000 [drp] | Freq: Two times a day (BID) | OPHTHALMIC | 5 refills | Status: DC

## 2018-02-18 ENCOUNTER — Telehealth: Payer: Self-pay | Admitting: *Deleted

## 2018-02-18 MED ORDER — OLOPATADINE HCL 0.2 % OP SOLN: 1.0000 [drp] | Freq: Every day | OPHTHALMIC | 2 refills | Status: DC

## 2018-02-18 NOTE — Telephone Encounter (Signed)
Patient's insurance does not cover Olopadine drops.  The insurance does cover Cromolyn , Pataday or Pazeo Drops.  Pataday is a 0.2% eye drop per [facility]..  Message sent to Dr. Luella Cook to consider a change in medication for patient.  Sander Nephew, RN   02/18/2018 9:48 AM

## 2018-03-02 ENCOUNTER — Other Ambulatory Visit: Payer: Self-pay | Admitting: Internal Medicine

## 2018-03-02 ENCOUNTER — Ambulatory Visit: Payer: Medicaid Other

## 2018-03-02 DIAGNOSIS — J301 Allergic rhinitis due to pollen: Secondary | ICD-10-CM

## 2018-03-02 MED ORDER — OLOPATADINE HCL 0.2 % OP SOLN: 1.0000 [drp] | Freq: Every day | OPHTHALMIC | 0 refills | Status: DC

## 2018-03-02 NOTE — Progress Notes (Signed)
Eye drop Rx changed to alternative based on pharmacy availability and insurance coverage.  Pearson Grippe, DO IM PGY-1

## 2018-03-03 ENCOUNTER — Ambulatory Visit (INDEPENDENT_AMBULATORY_CARE_PROVIDER_SITE_OTHER): Payer: Medicaid Other | Admitting: Pharmacist

## 2018-03-03 DIAGNOSIS — Z7901 Long term (current) use of anticoagulants: Secondary | ICD-10-CM

## 2018-03-03 DIAGNOSIS — D6859 Other primary thrombophilia: Secondary | ICD-10-CM | POA: Diagnosis not present

## 2018-03-03 LAB — POCT INR: INR: 2.2 (ref 2.0–3.0)

## 2018-03-03 NOTE — Patient Instructions (Signed)
Patient instructed to take medications as defined in the Anti-coagulation Track section of this encounter.  Patient instructed to take today's dose.  Patient instructed to take three (3) tablets of your 5mg  peach-colored warfarin tablets by mouth, once-daily at [facility] each day--EXCEPT on Saturdays and Sundays--on these days, take ONLY 2 & 1/2 tablets. Patient verbalized understanding of these instructions.

## 2018-03-03 NOTE — Progress Notes (Signed)
Anticoagulation Management Michele Gillespie is a 42 y.o. female who reports to the clinic for monitoring of warfarin treatment.    Indication: Hyerpcoagulable state, primary [D68.59]; Long term current use of anticoagulant.   Duration: indefinite Supervising physician: [place] [facility] Visit History: Patient does not report signs/symptoms of bleeding or thromboembolism  Other recent changes: No diet, medications, lifestyle changes endorsed.  Anticoagulation Episode Summary    Current INR goal:   2.0-3.0  TTR:   48.5 % (5.7 y)  Next INR check:   03/30/2018  INR from last check:   2.2 (03/03/2018)  Weekly max warfarin dose:     Target end date:   Indefinite  INR check location:   Anticoagulation Clinic  Preferred lab:     Send INR reminders to:      Indications   HYPERCOAGULABLE STATE PRIMARY [D68.59] Long term current use of anticoagulant [Z79.01]       Comments:           No Known Allergies Prior to Admission medications   Medication Sig Start Date End Date Taking? Authorizing Provider  acetaminophen (TYLENOL) 325 MG tablet Take 650 mg by mouth every 6 (six) hours as needed for mild pain.   Yes [provider]  albuterol (PROVENTIL HFA) 108 (90 Base) MCG/ACT inhaler INHALE 2 PUFFS INTO THE LUNGS EVERY 6 HOURS AS NEEDED FOR WHEEZING. FOR SHORTNESS OF BREATH Patient taking differently: Inhale 2 puffs into the lungs every 6 (six) hours as needed for shortness of breath.  12/11/16  Yes Ophelia Shoulder, MD  amLODipine (NORVASC) 5 MG tablet Take 1 tablet (5 mg total) by mouth daily. 11/21/17 11/21/18 Yes Ledell Noss, MD  ARIPiprazole (ABILIFY) 10 MG tablet Take 1 tablet (10 mg total) by mouth daily. 09/09/16  Yes Norman Herrlich, MD  cyclobenzaprine (FLEXERIL) 10 MG tablet Take 1 tablet (10 mg total) 2 (two) times daily as needed by mouth for muscle spasms. 08/07/17  Yes Domenic Moras, PA-C  diclofenac sodium (VOLTAREN) 1 % GEL Apply 2 g topically 4 (four) times  daily. 02/14/17  Yes Ophelia Shoulder, MD  HYDROcodone-acetaminophen (NORCO/VICODIN) 5-325 MG tablet Take 2 tablets by mouth every 4 (four) hours as needed. 01/25/17  Yes Esaw Grandchild, MD  nicotine polacrilex (NICORETTE) 2 MG gum RX #3 Weeks 10-12: 1 piece every 4-8 hours. Max 24 pieces per day. 10/20/15  Yes Emokpae, Ejiroghene E, MD  Olopatadine HCl (PATADAY) 0.2 % SOLN Apply 1 drop to eye daily. 03/02/18  Yes Neva Seat, MD  omeprazole (PRILOSEC) 20 MG capsule Take 1 capsule (20 mg total) by mouth daily. 01/28/18  Yes Neva Seat, MD  ondansetron (ZOFRAN-ODT) 4 MG disintegrating tablet Take 1 tablet (4 mg total) by mouth every 8 (eight) hours as needed for nausea or vomiting. 12/16/16  Yes Norman Herrlich, MD  predniSONE (DELTASONE) 20 MG tablet 2 tabs po daily x 4 days 08/07/17  Yes Domenic Moras, PA-C  traMADol (ULTRAM) 50 MG tablet Take 1 tablet (50 mg total) every 6 (six) hours as needed by mouth. 08/10/17  Yes Shary Decamp, PA-C  warfarin (COUMADIN) 5 MG tablet Take 3 tablets on Thursdays. All other days, take 2&1/2 tablets. 01/28/18  Yes Neva Seat, MD   Past Medical History:  Diagnosis Date  . Anemia 2007    microcytic anemia, baseline hemoglobin 10-11, MCV at baseline 72-77, secondary to iron deficiency  . Arm DVT (deep venous thromboembolism), acute [facility])  September 23, 2009, January 05, 2010  Doppler study significant with indeterminant age DVT involving the left upper extremity, Doppler performed January 05, 2010 consistent with acute DVT involving the left upper extremity  . Asthma   . Depression   . Hypertension   . Leukopenia 2008    unclear etiology baseline WBC  2.8-3.7  . Lung nodule  June 10, 2008    stable tiny noduke noted along the minor fissure of the right lung on CT angio September 11, 09 -  stable for 2 years and consistent with benign disease  . Pulmonary embolism [facility])  September 07, 2005    her CT angiogram - positive for pulmonary emboli to several branches  of the right lower lobe- relatively small clot burden, clear lung; patient started on Coumadin; CT angiogram on January 10, 2006 showed resolution of previously seen pulmonary emboli with minimal basilar atelectasis  . Schizophrenia [facility])    Social History   Socioeconomic History  . Marital status: Married    Spouse name: Not on file  . Number of children: Not on file  . Years of education: Not on file  . Highest education level: Not on file  Occupational History  . Not on file  Social Needs  . Financial resource strain: Not on file  . Food insecurity:    Worry: Not on file    Inability: Not on file  . Transportation needs:    Medical: Not on file    Non-medical: Not on file  Tobacco Use  . Smoking status: Current Every Day Smoker    Packs/day: 0.25    Years: 0.00    Pack years: 0.00    Types: Cigarettes  . Smokeless tobacco: Never Used  . Tobacco comment: ABOUT 6 CIGARETTES A DAY  Substance and Sexual Activity  . Alcohol use: Yes    Alcohol/week: 0.0 oz  . Drug use: Yes    Types: Marijuana  . Sexual activity: Yes    Birth control/protection: None    Comment: tubal  Lifestyle  . Physical activity:    Days per week: Not on file    Minutes per session: Not on file  . Stress: Not on file  Relationships  . Social connections:    Talks on phone: Not on file    Gets together: Not on file    Attends religious service: Not on file    Active member of club or organization: Not on file    Attends meetings of clubs or organizations: Not on file    Relationship status: Not on file  Other Topics Concern  . 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 Hypertension Mother   . Congestive Heart Failure Mother   . Diabetes Father   . Birth defects Maternal Aunt   . Birth  defects Maternal Uncle   . Diabetes Paternal Grandmother   . Breast cancer Sister   . Breast cancer Sister     ASSESSMENT Recent Results: The most recent result is correlated with 100 mg per week: Lab Results  Component Value Date   INR 2.2 03/03/2018   INR 2.10 02/02/2018   INR 2.90 01/05/2018    Anticoagulation Dosing: Description   Take three (3) tablets of your 5mg  peach-colored warfarin tablets by mouth, once-daily at [facility] each  day--EXCEPT on Saturdays and Sundays--on these days, take ONLY 2 & 1/2 tablets.      INR today: Therapeutic  PLAN Weekly dose was unchanged.  Patient Instructions  Patient instructed to take medications as defined in the Anti-coagulation Track section of this encounter.  Patient instructed to take today's dose.  Patient instructed to take three (3) tablets of your 5mg  peach-colored warfarin tablets by mouth, once-daily at [facility] each day--EXCEPT on Saturdays and Sundays--on these days, take ONLY 2 & 1/2 tablets. Patient verbalized understanding of these instructions.     Patient advised to contact clinic or seek medical attention if signs/symptoms of bleeding or thromboembolism occur.  Patient verbalized understanding by repeating back information and was advised to contact me if further medication-related questions arise. Patient was also provided an information handout.  Follow-up Return in 1 month (on 03/30/2018) for Follow up INR at 1:45PM.  Pennie Banter, PharmD, CACP, CPP  15 minutes spent face-to-face with the patient during the encounter. 50% of time spent on education. 50% of time was spent on fingerstick point of care INR sample collection, processing, results determination, and documentation in [email or web address].

## 2018-03-06 ENCOUNTER — Encounter (INDEPENDENT_AMBULATORY_CARE_PROVIDER_SITE_OTHER): Payer: Self-pay

## 2018-03-06 ENCOUNTER — Ambulatory Visit: Payer: Medicaid Other | Admitting: Internal Medicine

## 2018-03-06 DIAGNOSIS — I10 Essential (primary) hypertension: Secondary | ICD-10-CM | POA: Diagnosis not present

## 2018-03-06 DIAGNOSIS — F209 Schizophrenia, unspecified: Secondary | ICD-10-CM

## 2018-03-06 DIAGNOSIS — M3390 Dermatopolymyositis, unspecified, organ involvement unspecified: Secondary | ICD-10-CM | POA: Insufficient documentation

## 2018-03-06 DIAGNOSIS — N644 Mastodynia: Secondary | ICD-10-CM

## 2018-03-06 DIAGNOSIS — Z7901 Long term (current) use of anticoagulants: Secondary | ICD-10-CM | POA: Diagnosis not present

## 2018-03-06 DIAGNOSIS — R0789 Other chest pain: Secondary | ICD-10-CM | POA: Insufficient documentation

## 2018-03-06 DIAGNOSIS — Z803 Family history of malignant neoplasm of breast: Secondary | ICD-10-CM | POA: Diagnosis not present

## 2018-03-06 DIAGNOSIS — M3399 Dermatopolymyositis, unspecified with other organ involvement: Principal | ICD-10-CM

## 2018-03-06 DIAGNOSIS — Z86711 Personal history of pulmonary embolism: Secondary | ICD-10-CM

## 2018-03-06 HISTORY — DX: Other chest pain: R07.89

## 2018-03-06 MED ORDER — ARIPIPRAZOLE 10 MG PO TABS: 10.0000 mg | ORAL_TABLET | Freq: Every day | ORAL | 0 refills | Status: DC

## 2018-03-06 NOTE — Assessment & Plan Note (Signed)
Assessment Presents with what appears to be heliotrope rash without signs of muscle tenderness or Gottron's papules. CK in 2011 was normal. ANA, anti-dsDNA antibody, and RF were normal in 2010. Dermatomyositis is on the differential, however she does not have other signs or symptoms. She has a f/u appointment with her PCP on 6/27, so will check labs at that visit. Will also send Dermatology referral today.  Plan - Recommend checking CK, ANA, anti Mi-2 antibody, anti Jo-1 antibody - Ambulatory referral to Dermatology placed

## 2018-03-06 NOTE — Progress Notes (Signed)
   CC: left breast pain  HPI:  [name] is a 42 y.o. female with PMH of PE (on warfarin), schizophrenia, and HTN who presents with 5 days of left breast pain.  She reports onset of sharp pain under her left breast 5 days ago. The pain is improving. She denies any trauma to the area or any other known inciting event. She reports the pain is constant but experiences worsening soreness when she palpates the area. She denies worsening pain with deep breaths. She denies shortness of breath, fevers, lower extremity swelling, or recent travel. This has never happened to her before. Her last mammogram in 05/2017 was normal. She does have a strong FHx of breast cancer, which is what brings her in today.  She is still having menstrual cycles. Last menstrual cycle 2 weeks ago was regular.  She also asks about why her eyelids are so dark. She states this has been happening for 2-3 years. Denies muscle pain or weakness. No other rashes elsewhere.  Please see the assessment and plan below for the status of the patient's chronic medical problems.  Past Medical History:  Diagnosis Date  . Anemia 2007    microcytic anemia, baseline hemoglobin 10-11, MCV at baseline 72-77, secondary to iron deficiency  . Arm DVT (deep venous thromboembolism), acute [facility])  September 23, 2009, January 05, 2010    Doppler study significant with indeterminant age DVT involving the left upper extremity, Doppler performed January 05, 2010 consistent with acute DVT involving the left upper extremity  . Asthma   . Depression   . Hypertension   . Leukopenia 2008    unclear etiology baseline WBC  2.8-3.7  . Lung nodule  June 10, 2008    stable tiny noduke noted along the minor fissure of the right lung on CT angio September 11, 09 -  stable for 2 years and consistent with benign disease  . Pulmonary embolism [facility])  September 07, 2005    her CT angiogram - positive for pulmonary emboli to several branches of the right lower  lobe- relatively small clot burden, clear lung; patient started on Coumadin; CT angiogram on January 10, 2006 showed resolution of previously seen pulmonary emboli with minimal basilar atelectasis  . Schizophrenia ([place])    Review of Systems:  GEN: Negative for fevers HENT: Positive for rash around bilateral eyelids CV: Positive for left breast pain PULM: Negative for SOB EXT: Negative for LE swelling  Physical Exam:  Vitals:   03/06/18 1324  BP: 122/85  Pulse: 79  Temp: 98 F (36.7 C)  TempSrc: Oral  SpO2: 100%  Weight: 218 lb (98.9 kg)   Physical Exam  Constitutional: She is well-developed, well-nourished, and in no distress.  Cardiovascular: Normal rate and regular rhythm.  No murmur heard. Pulmonary/Chest: Effort normal and breath sounds normal. No respiratory distress. She has no wheezes. She has no rales.  Musculoskeletal:       Arms: Mild TTP of left anterior chest wall under left breast. No rash, fluctuance, erythema, swelling, mass/lesion, or skin changes noted  Skin:  Negative for Gottron's papules  LYMPH: Negative for axillary lymphadenopathy  Assessment & Plan:   See Encounters Tab for problem based charting.  Patient seen with Dr. Evette Doffing

## 2018-03-06 NOTE — Patient Instructions (Addendum)
FOLLOW-UP INSTRUCTIONS When: 03/26/2018 (appointment already scheduled) For: follow up of blood pressure What to bring: medications   Michele Gillespie,  It was good to meet you today.  I did not feel any lumps or bumps on the area where you were hurting today. You did have a normal mammogram in September of 2018, so you are due in a few months (September 2019). I'm glad the pain is getting better. If you start seeing skin changes, a rash, have a fever, or have trouble breathing, please give [place] a call to be seen sooner. Otherwise, you have an appointment with your primary doctor on June 27.  I sent a referral to the skin doctors for the rash around your eyes.  If you have any questions or concerns, call our clinic at [phone] or after hours call [phone] and ask for the internal medicine resident on call.

## 2018-03-06 NOTE — Assessment & Plan Note (Signed)
Assessment Patient endorses 5 days of chest wall tenderness under left breast, which is improving. No known trauma or inciting event. Not associated with menses. No rash, lesion, or skin changes palpable on exam to suggest mastitis, cyst, or inflammatory breast cancer. She does have a history of VTE, but is adherent to warfarin, most recent INR was therapeutic at 2.2. Pain is not pleuritic, she is not tachycardic or hypoxic, and she does not have lower extremity swelling, SOB, or hx of recent travel, so I have low suspicion for VTE as the etiology for her tenderness. She does have a strong family history of breast cancer, but she had a mammogram in 05/2017 that was normal, and I do not feel any palpable masses on exam. Possibly MSK in nature vs irritation from wire bra. Provided reassurance and will continue to monitor. Patient provided with return precautions.  Plan - Continue to monitor - Continue warfarin - Encouraged patient to ensure repeat screening mammogram is completed in 05/2018

## 2018-03-09 NOTE — Addendum Note (Signed)
Addended by: Lalla Brothers T on: 03/09/2018 02:00 PM   Modules accepted: Level of Service

## 2018-03-09 NOTE — Progress Notes (Signed)
Internal Medicine Clinic Attending  I saw and evaluated the patient.  I personally confirmed the key portions of the history and exam documented by Dr. Ronalee Red and I reviewed pertinent patient test results.  The assessment, diagnosis, and plan were formulated together and I agree with the documentation in the resident's note.  Rash on her face is consistent with heliotrope rash, but no other symptoms or signs of dermatomyositis. The patient left before we could check auto-antibodies, I agree with doing these in the future. If she does have DM, we will need to be more aggressive in cancer screening.

## 2018-03-13 IMAGING — MR MR HIP*R* W/O CM
4 of 5 series · 15 of 40 positions shown · non-contrast
Comparison: Right hip x-rays dated August 07, 2017. CT abdomen
pelvis dated March 14, 2013.

CLINICAL DATA: Chronic right hip pain for the past year. Recent
fall.

EXAM:
MR OF THE RIGHT HIP WITHOUT CONTRAST
TECHNIQUE: Multiplanar, multisequence MR imaging was performed. No intravenous
contrast was administered.

[Series 3: T1 · coronal · 4.0mm · 0.49mm/px · 3 of 34 slices shown (1 of 2)]
[im 5/34]
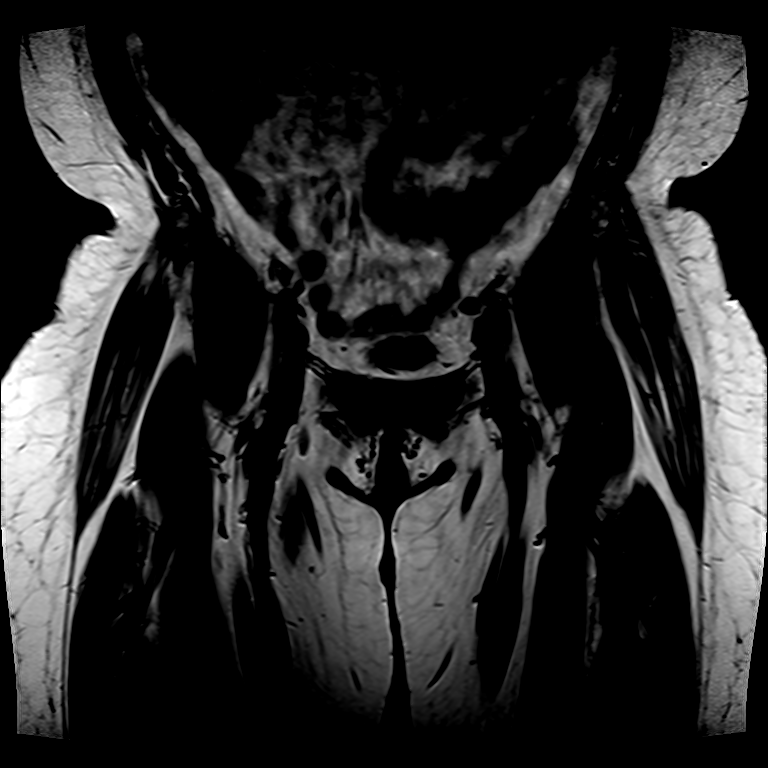
[im 17/34]
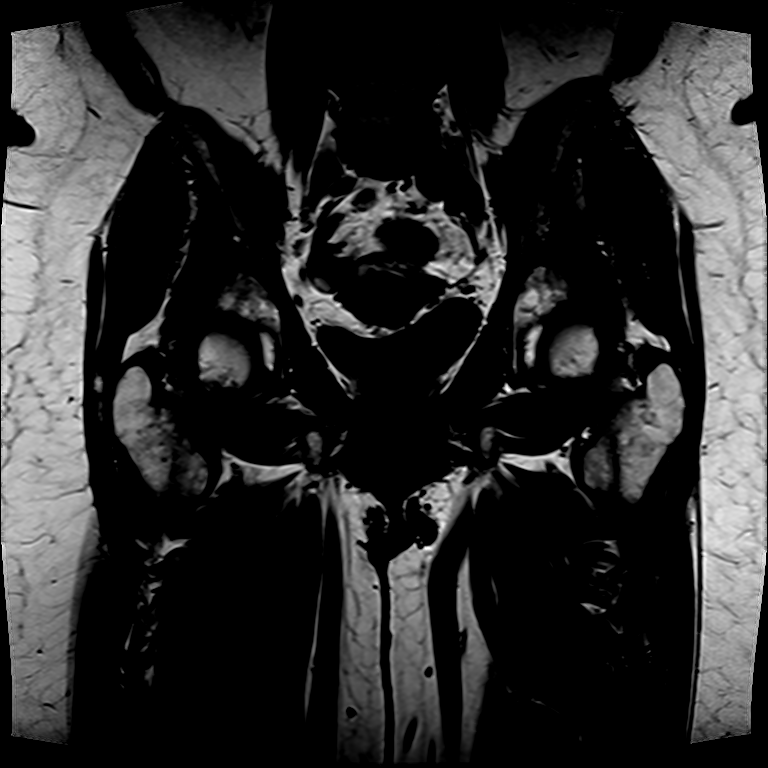
[im 29/34]
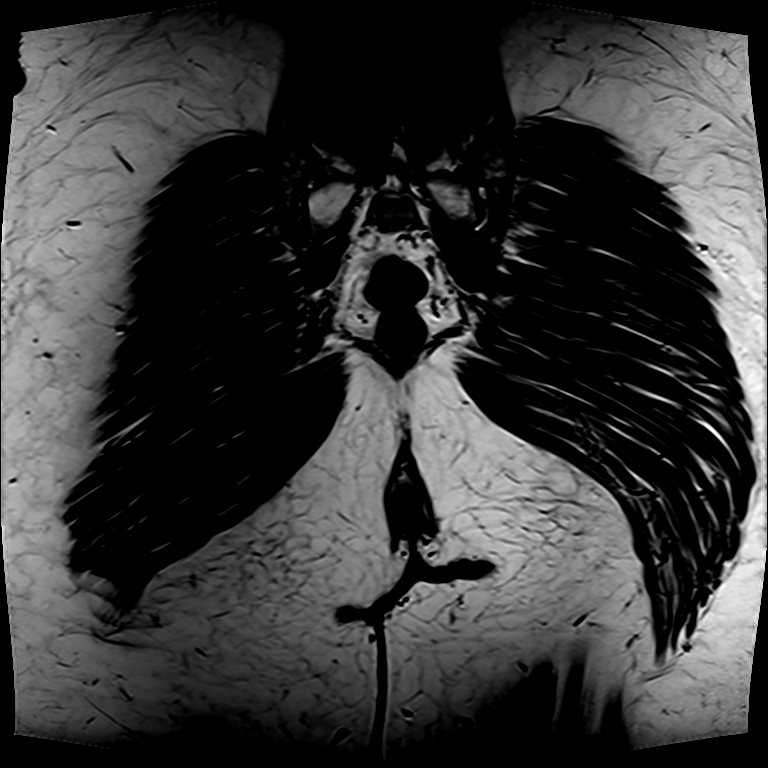

[Series 4: T2 fat-sat · axial · 4.0mm · 0.52mm/px · z∈[-36,+60]mm · 3 of 30 slices shown]
[im 5/30]
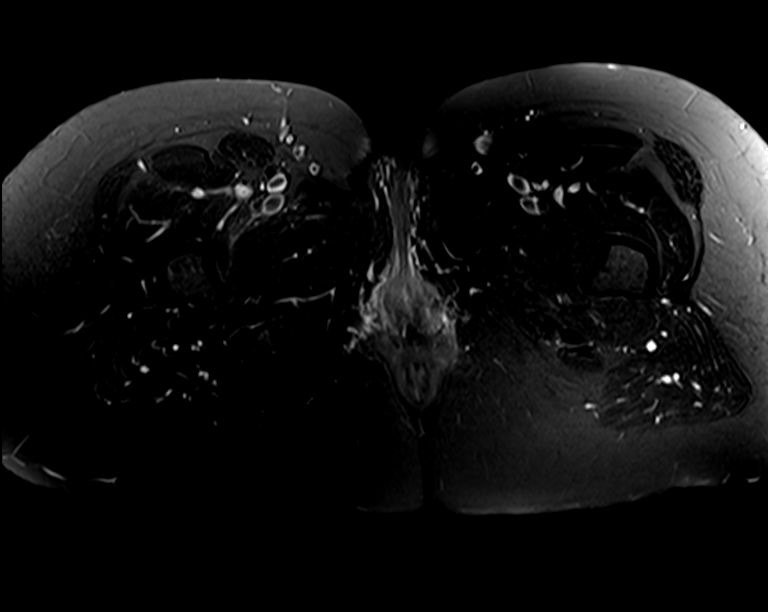
[im 17/30]
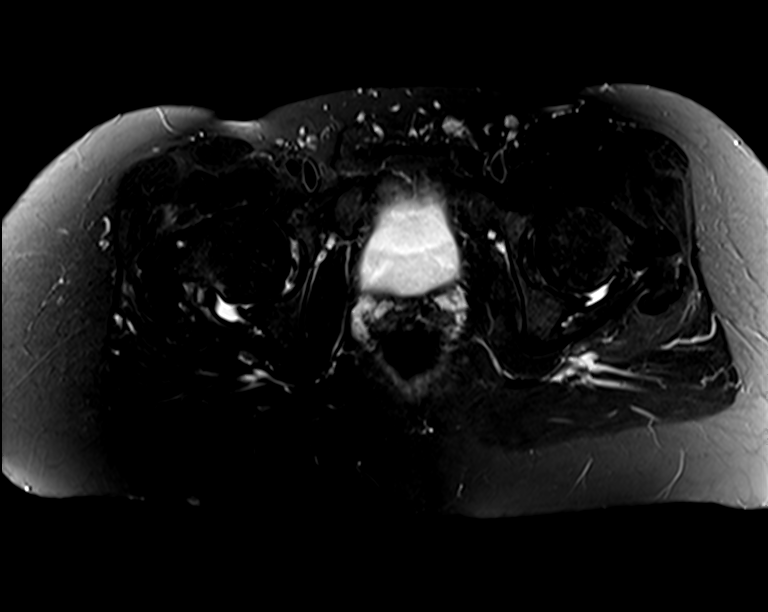
[im 25/30]
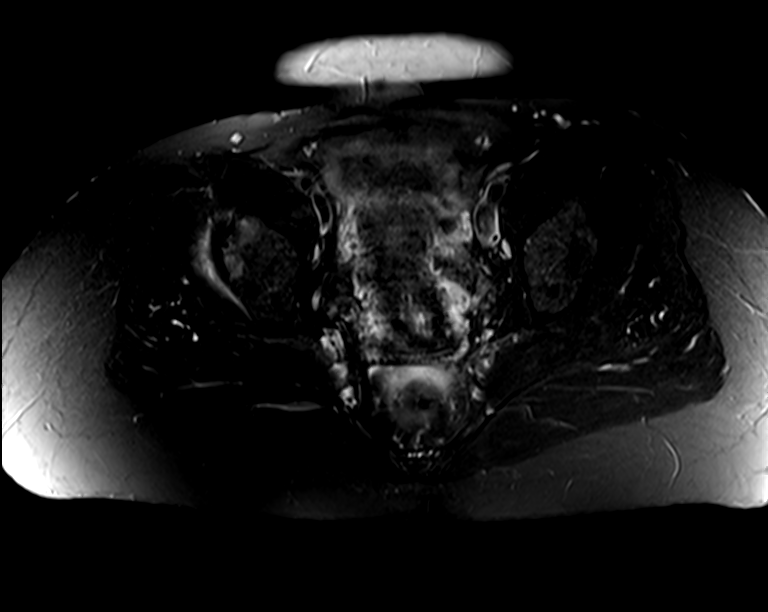

[Series 5: T1 · axial · 4.0mm · 0.49mm/px · z∈[-36,+60]mm · 3 of 30 slices shown (2 of 2)]
[im 5/30]
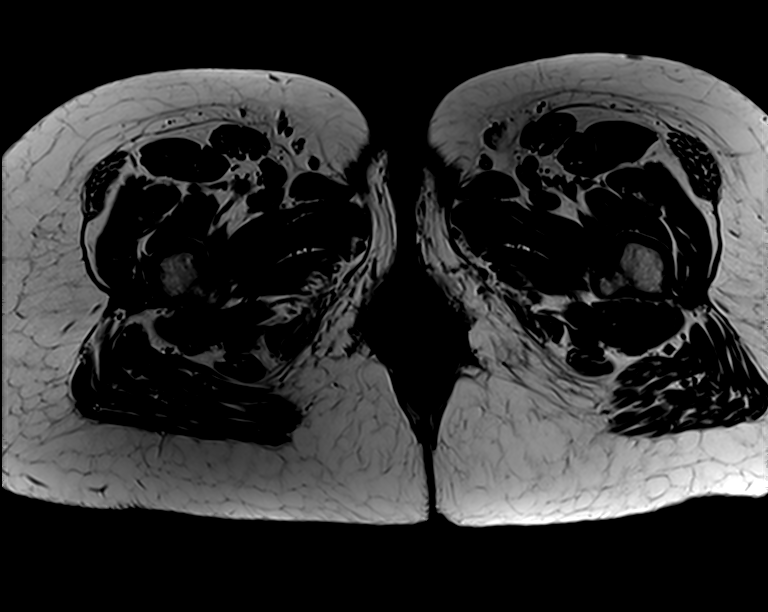
[im 17/30]
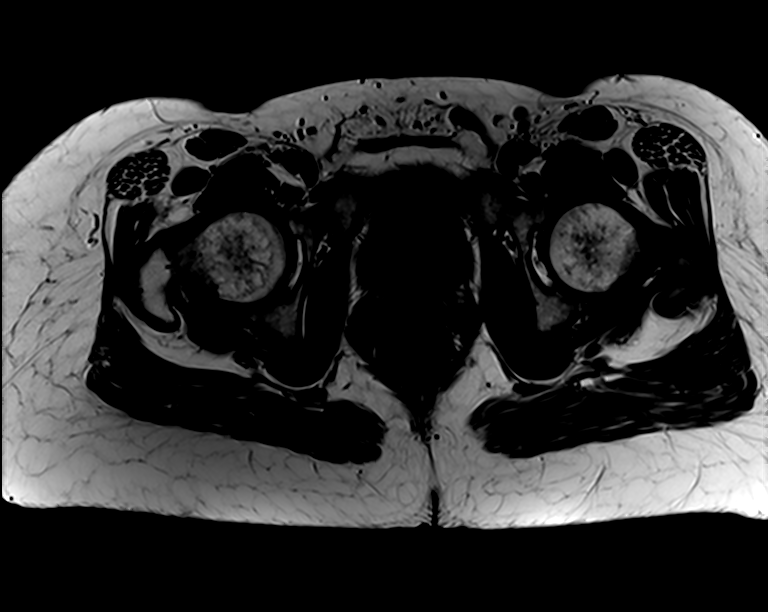
[im 25/30]
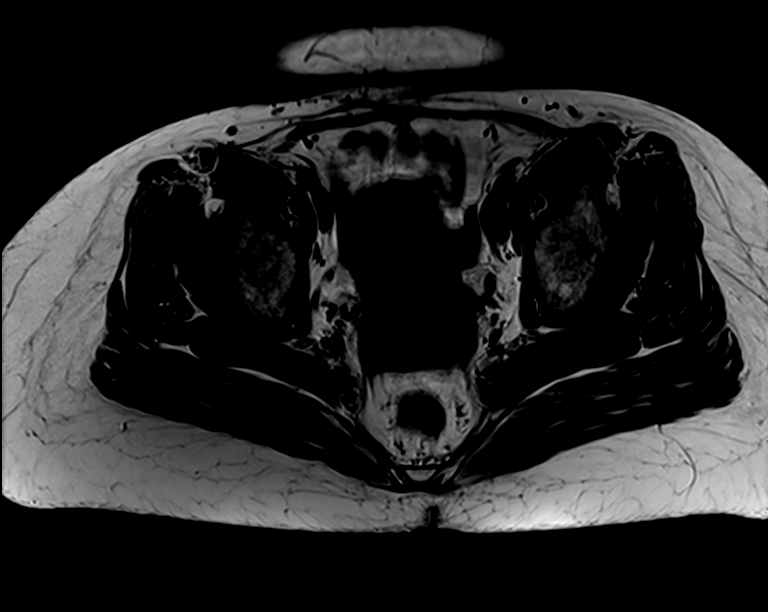

[Series 6: PD · sagittal · 4.0mm · 0.70mm/px · 6 of 21 slices shown]
[im 1/21]
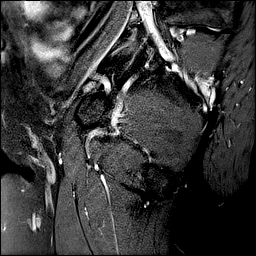
[im 5/21]
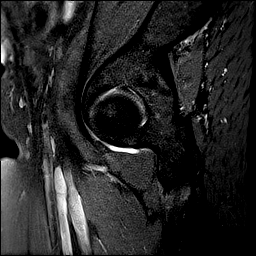
[im 9/21]
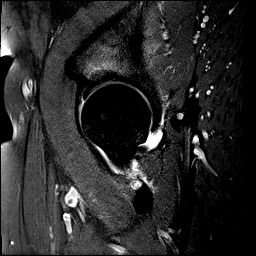
[im 13/21]
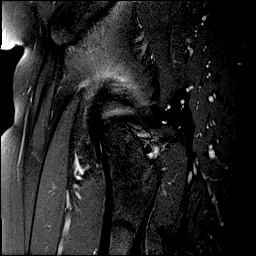
[im 17/21]
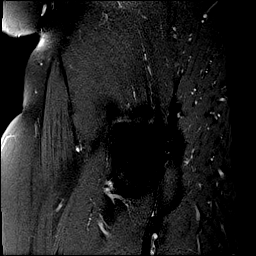
[im 21/21]
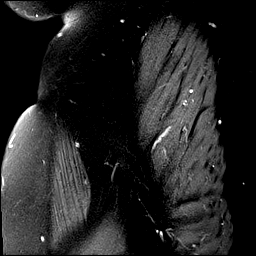

[15 of 40 positions shown; findings below may reference images not displayed]

FINDINGS: Bones: There is no evidence of acute fracture, dislocation or
avascular necrosis. There is a small amount of edema in the right
superolateral acetabulum. The visualized sacroiliac joints and
symphysis pubis appear normal.

Articular cartilage and labrum

Articular cartilage: No focal chondral defect or subchondral signal
abnormality identified.

Labrum: There is no gross labral tear or paralabral abnormality.

Joint or bursal effusion

Joint effusion: Trace right hip joint effusion. No left hip joint
effusion.

Bursae: No focal periarticular fluid collection.

Muscles and tendons

Muscles and tendons: Small area of T1 and T2 hypointense signal in
the region of the indirect head of the rectus femoris with
surrounding edema extending into the proximal gluteus minimus muscle
Mild right iliopsoas bursitis. The visualized gluteus, hamstring and
left iliopsoas tendons appear normal. The piriformis muscles appear
symmetric.

Other findings

Miscellaneous: The visualized internal pelvic contents appear
unremarkable.
IMPRESSION: 1. Small area of periarticular T1 and T2 hypointense signal along
the right superolateral hip joint in the region of the indirect head
of the rectus femoris with surrounding edema in the adjacent
acetabulum and gluteus minimus muscle. This likely corresponds to
the site of calcifications seen on prior x-rays, and may reflect
hydroxyapatite deposition disease or CPPD.
2. Trace right hip joint effusion is likely reactive.
3. Mild right iliopsoas bursitis.
4. No fracture or significant degenerative changes.

## 2018-03-26 ENCOUNTER — Ambulatory Visit: Payer: Medicaid Other | Admitting: Internal Medicine

## 2018-03-26 ENCOUNTER — Other Ambulatory Visit: Payer: Self-pay

## 2018-03-26 ENCOUNTER — Encounter: Payer: Self-pay | Admitting: Internal Medicine

## 2018-03-26 VITALS — BP 106/73 | HR 74 | Temp 98.4°F | Ht 65.0 in | Wt 225.1 lb

## 2018-03-26 DIAGNOSIS — Z8781 Personal history of (healed) traumatic fracture: Secondary | ICD-10-CM

## 2018-03-26 DIAGNOSIS — M3399 Dermatopolymyositis, unspecified with other organ involvement: Secondary | ICD-10-CM | POA: Diagnosis not present

## 2018-03-26 DIAGNOSIS — J029 Acute pharyngitis, unspecified: Secondary | ICD-10-CM | POA: Diagnosis not present

## 2018-03-26 DIAGNOSIS — R51 Headache: Secondary | ICD-10-CM | POA: Diagnosis not present

## 2018-03-26 DIAGNOSIS — F1490 Cocaine use, unspecified, uncomplicated: Secondary | ICD-10-CM | POA: Diagnosis not present

## 2018-03-26 DIAGNOSIS — M67432 Ganglion, left wrist: Secondary | ICD-10-CM | POA: Diagnosis not present

## 2018-03-26 DIAGNOSIS — I10 Essential (primary) hypertension: Secondary | ICD-10-CM | POA: Diagnosis not present

## 2018-03-26 DIAGNOSIS — J342 Deviated nasal septum: Secondary | ICD-10-CM | POA: Insufficient documentation

## 2018-03-26 DIAGNOSIS — Z79899 Other long term (current) drug therapy: Secondary | ICD-10-CM

## 2018-03-26 DIAGNOSIS — R519 Headache, unspecified: Secondary | ICD-10-CM

## 2018-03-26 DIAGNOSIS — M3390 Dermatopolymyositis, unspecified, organ involvement unspecified: Secondary | ICD-10-CM

## 2018-03-26 DIAGNOSIS — R0789 Other chest pain: Secondary | ICD-10-CM

## 2018-03-26 HISTORY — DX: Acute pharyngitis, unspecified: J02.9

## 2018-03-26 HISTORY — DX: Deviated nasal septum: J34.2

## 2018-03-26 NOTE — Assessment & Plan Note (Signed)
Patient states that her chest pain has resolved and she has scheduled her mammogram for September. - Mammogram as scheduled

## 2018-03-26 NOTE — Patient Instructions (Addendum)
Thank you for allowing [place] to care for you  For your rash - Follow up with dermatology as scheduled - we are getting labs today, you will be contacted with the results  For your high blood pressure - Continue current medication  For your head pain - Continue as needed tylenol - Lets [place] know if symptoms worsen or are more difficult to control  For your throat pain - This may be due to viral infection - Let us know if pain become worse or you have difficulty swallowing  For your wrist cyst - This is likely a benign cyst, we will continue to monitor it  Please schedule appointment for PAP smear  Please follow up in about 6 months or sooner if needed

## 2018-03-26 NOTE — Assessment & Plan Note (Signed)
Patient reports a small cystic mass at the lateral volar aspect of her left wrist, most likely representing a ganglion cyst. She state the cyst has been present for about 2 years and is usually not pain. She has noticed it has increased somewhat in size.  She was informed that we would monitor the cyst for now and she should let us know if it become painful and inhibits her function or grows significantly. She states she has no interest in surgical removal. - Continue to monitor

## 2018-03-26 NOTE — Assessment & Plan Note (Addendum)
Patient reports 2 years of intermittent sore throat occasionally with associated unilateral ear pain, most recently for the past few weeks. She states she feels it has been happening more often since have her molars pulled 2 years and attributed it to chewing her food less and having throat irritation from the food. No ear pain today. Oropharynx pink and moist on exam without exudate and no cervical LAD. Patient history most consistent with recurrent viral pharyngitis.  - Patient to continue supportive care at home - Return precautions given

## 2018-03-26 NOTE — Progress Notes (Signed)
   CC: Michele Gillespie, Chest wall tenderness, Hypertension, Headache, Sore Throat, Ganglion Cyst, Deviated septum  HPI:  [name] is a 42 y.o. F with PMHx listed below presenting for Michele Gillespie, Chest wall tenderness, Hypertension, Headache, Sore Throat, Ganglion Cyst, Deviated septum. Please see the A&P for the status of the patient's chronic medical problems.   Past Medical History:  Diagnosis Date  . Anemia 2007    microcytic anemia, baseline hemoglobin 10-11, MCV at baseline 72-77, secondary to iron deficiency  . Arm DVT (deep venous thromboembolism), acute [facility])  September 23, 2009, January 05, 2010    Doppler study significant with indeterminant age DVT involving the left upper extremity, Doppler performed January 05, 2010 consistent with acute DVT involving the left upper extremity  . Asthma   . Depression   . Hypertension   . Leukopenia 2008    unclear etiology baseline WBC  2.8-3.7  . Lung nodule  June 10, 2008    stable tiny noduke noted along the minor fissure of the right lung on CT angio September 11, 09 -  stable for 2 years and consistent with benign disease  . Pulmonary embolism [facility])  September 07, 2005    her CT angiogram - positive for pulmonary emboli to several branches of the right lower lobe- relatively small clot burden, clear lung; patient started on Coumadin; CT angiogram on January 10, 2006 showed resolution of previously seen pulmonary emboli with minimal basilar atelectasis  . Schizophrenia ([place])    Review of Systems: Performed and all others negative.  Physical Exam:  Vitals:   03/26/18 1424  BP: 106/73  Pulse: 74  Temp: 98.4 F (36.9 C)  SpO2: 99%  Weight: 225 lb 1.6 oz (102.1 kg)  Height: 5\' 5"  (1.651 m)   Physical Exam  Constitutional: She appears well-developed and well-nourished. No distress.  HENT:  Head: Normocephalic and atraumatic.  Mouth/Throat: Oropharynx is clear and moist.  Leftward deviated septum  Eyes: EOM are normal.  Right eye exhibits no discharge. Left eye exhibits no discharge.  Cardiovascular: Normal rate, regular rhythm, normal heart sounds and intact distal pulses.  Pulmonary/Chest: Effort normal and breath sounds normal. No respiratory distress.  Abdominal: Soft. Bowel sounds are normal. She exhibits no distension. There is no tenderness.  Musculoskeletal: She exhibits no edema.  L wrist: small nodule at lateral volar surface  Lymphadenopathy:    She has no cervical adenopathy.  Skin: Skin is warm and dry.   Assessment & Plan:   See Encounters Tab for problem based charting.  Patient discussed with Dr. Angelia Mould

## 2018-03-26 NOTE — Assessment & Plan Note (Signed)
Patient states she is concerned she may have something wrong with her nose as she finds it more difficult to breath our of the left side. She attributer this to history of approximately 27 year of cocaine use. On exam, septum is deviated to the left. Patient does not appear to currently be limited by this abnormality, so will continue to monitor. If she develops worsening allergic symptoms exacerbated by her deviated septum could consider ENT referral. - Continue to monitor

## 2018-03-26 NOTE — Assessment & Plan Note (Signed)
BP today is well controlled and even on the softer side at 106/73. Patient denies any lightheadedness. She has been tolerating her medciation well. - Continue Amlodipine 5mg  Daily

## 2018-03-26 NOTE — Assessment & Plan Note (Signed)
Patient was recently seen for heliotrope rash with intention to begin work up for Dermatomyositis. Checking CK, ANA, anti Mi-2, and anti Jo-1 antibodies was recommended at that time, but unable to be obtained as patient left before labs could be obtained. Will obtain CK, ANA, anti Jo-1 antibody. Anti Mi-2 antibody not able to be located in orders. Patient also has follow up scheduled for early next month with dermatology.  - Follow up with dermatology - CK, ANA, anti Jo-1 antibodies
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
## 2018-03-27 LAB — ANTINUCLEAR ANTIBODIES, IFA: ANA Titer 1: POSITIVE — AB

## 2018-03-27 LAB — ANTI-JO 1 ANTIBODY, IGG

## 2018-03-27 LAB — FANA STAINING PATTERNS: Speckled Pattern: 1:160 {titer} — ABNORMAL HIGH

## 2018-03-27 LAB — CK: CK TOTAL: 107 U/L (ref 24–173)

## 2018-03-30 ENCOUNTER — Ambulatory Visit (INDEPENDENT_AMBULATORY_CARE_PROVIDER_SITE_OTHER): Payer: Medicaid Other | Admitting: Pharmacist

## 2018-03-30 ENCOUNTER — Encounter: Payer: Self-pay | Admitting: Internal Medicine

## 2018-03-30 ENCOUNTER — Other Ambulatory Visit (HOSPITAL_COMMUNITY)
Admission: RE | Admit: 2018-03-30 | Discharge: 2018-03-30 | Disposition: A | Discharge status: Home / Self Care | Payer: Medicaid Other | Source: Ambulatory Visit | Attending: Internal Medicine | Admitting: Internal Medicine

## 2018-03-30 ENCOUNTER — Ambulatory Visit: Payer: Medicaid Other | Admitting: Internal Medicine

## 2018-03-30 VITALS — BP 131/86 | HR 77 | Temp 98.2°F | Wt 225.4 lb

## 2018-03-30 DIAGNOSIS — Z87820 Personal history of traumatic brain injury: Secondary | ICD-10-CM | POA: Diagnosis not present

## 2018-03-30 DIAGNOSIS — Z0000 Encounter for general adult medical examination without abnormal findings: Secondary | ICD-10-CM | POA: Diagnosis not present

## 2018-03-30 DIAGNOSIS — R51 Headache: Secondary | ICD-10-CM | POA: Diagnosis not present

## 2018-03-30 DIAGNOSIS — R413 Other amnesia: Secondary | ICD-10-CM

## 2018-03-30 DIAGNOSIS — D6859 Other primary thrombophilia: Secondary | ICD-10-CM | POA: Diagnosis not present

## 2018-03-30 DIAGNOSIS — R519 Headache, unspecified: Secondary | ICD-10-CM

## 2018-03-30 DIAGNOSIS — Z7901 Long term (current) use of anticoagulants: Secondary | ICD-10-CM | POA: Diagnosis not present

## 2018-03-30 LAB — POCT INR: INR: 3 (ref 2.0–3.0)

## 2018-03-30 NOTE — Assessment & Plan Note (Addendum)
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 RPR, TSH, HIV within normal limits.  Plan: Will check Vit B12 today and refer to neurology

## 2018-03-30 NOTE — Patient Instructions (Signed)
Patient instructed to take medications as defined in the Anti-coagulation Track section of this encounter.  Patient instructed to take today's dose.  Patient instructed to take three (3) tablets of your 5mg  peach-colored warfarin tablets by mouth, once-daily at [facility] each day--EXCEPT on Mondays, Wednesdays and Fridays--on these days, take ONLY 2 & 1/2 tablets. Patient verbalized understanding of these instructions.

## 2018-03-30 NOTE — Progress Notes (Signed)
Internal Medicine Clinic Attending  Case discussed with Dr. Melvin  at the time of the visit.  We reviewed the resident's history and exam and pertinent patient test results.  I agree with the assessment, diagnosis, and plan of care documented in the resident's note.  

## 2018-03-30 NOTE — Patient Instructions (Signed)
It was nice seeing you today. Thank you for choosing [facility] for your Primary Care.   Today we talked about:  1) Headaches and memory loss: We will check a B12 level and refer you to neurology 2) Pap smear   Please contact the clinic if you have any problems, or need to be seen sooner.

## 2018-03-30 NOTE — Progress Notes (Signed)
Internal Medicine Clinic Attending  I saw and evaluated the patient.  I personally confirmed the key portions of the history and exam documented by Dr. Vogel and I reviewed pertinent patient test results.  The assessment, diagnosis, and plan were formulated together and I agree with the documentation in the resident's note.  

## 2018-03-30 NOTE — Assessment & Plan Note (Signed)
Routine pap smear performed. No gyn complaints. Last pap 2014 normal.
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
## 2018-03-30 NOTE — Progress Notes (Signed)
Anticoagulation Management Michele Gillespie is a 42 y.o. female who reports to the clinic for monitoring of warfarin treatment.    Indication: Hypercoagulable state, on long term current use of anticoagulant.  Duration: indefinite Supervising physician: Joni Reining  Anticoagulation Clinic Visit History: Patient does not report signs/symptoms of bleeding or thromboembolism  Other recent changes: No diet, medications, lifestyle changes endorsed.  Anticoagulation Episode Summary    Current INR goal:   2.0-3.0  TTR:   49.1 % (5.8 y)  Next INR check:   04/27/2018  INR from last check:   3.0 (03/30/2018)  Weekly max warfarin dose:     Target end date:   Indefinite  INR check location:   Anticoagulation Clinic  Preferred lab:     Send INR reminders to:      Indications   HYPERCOAGULABLE STATE PRIMARY [D68.59] Long term current use of anticoagulant [Z79.01]       Comments:           No Known Allergies Prior to Admission medications   Medication Sig Start Date End Date Taking? Authorizing Provider  acetaminophen (TYLENOL) 325 MG tablet Take 650 mg by mouth every 6 (six) hours as needed for mild pain.   Yes [provider]  albuterol (PROVENTIL HFA) 108 (90 Base) MCG/ACT inhaler INHALE 2 PUFFS INTO THE LUNGS EVERY 6 HOURS AS NEEDED FOR WHEEZING. FOR SHORTNESS OF BREATH Patient taking differently: Inhale 2 puffs into the lungs every 6 (six) hours as needed for shortness of breath.  12/11/16  Yes Ophelia Shoulder, MD  amLODipine (NORVASC) 5 MG tablet Take 1 tablet (5 mg total) by mouth daily. 11/21/17 11/21/18 Yes Ledell Noss, MD  ARIPiprazole (ABILIFY) 10 MG tablet Take 1 tablet (10 mg total) by mouth daily. 03/06/18  Yes Colbert Ewing, MD  cyclobenzaprine (FLEXERIL) 10 MG tablet Take 1 tablet (10 mg total) 2 (two) times daily as needed by mouth for muscle spasms. 08/07/17  Yes Domenic Moras, PA-C  diclofenac sodium (VOLTAREN) 1 % GEL Apply 2 g topically 4 (four) times daily. 02/14/17  Yes  Ophelia Shoulder, MD  HYDROcodone-acetaminophen (NORCO/VICODIN) 5-325 MG tablet Take 2 tablets by mouth every 4 (four) hours as needed. 01/25/17  Yes Esaw Grandchild, MD  nicotine polacrilex (NICORETTE) 2 MG gum RX #3 Weeks 10-12: 1 piece every 4-8 hours. Max 24 pieces per day. 10/20/15  Yes Emokpae, Ejiroghene E, MD  Olopatadine HCl (PATADAY) 0.2 % SOLN Apply 1 drop to eye daily. 03/02/18  Yes Neva Seat, MD  omeprazole (PRILOSEC) 20 MG capsule Take 1 capsule (20 mg total) by mouth daily. 01/28/18  Yes Neva Seat, MD  ondansetron (ZOFRAN-ODT) 4 MG disintegrating tablet Take 1 tablet (4 mg total) by mouth every 8 (eight) hours as needed for nausea or vomiting. 12/16/16  Yes Norman Herrlich, MD  predniSONE (DELTASONE) 20 MG tablet 2 tabs po daily x 4 days 08/07/17  Yes Domenic Moras, PA-C  traMADol (ULTRAM) 50 MG tablet Take 1 tablet (50 mg total) every 6 (six) hours as needed by mouth. 08/10/17  Yes Shary Decamp, PA-C  warfarin (COUMADIN) 5 MG tablet Take 3 tablets on Thursdays. All other days, take 2&1/2 tablets. 01/28/18  Yes Neva Seat, MD   Past Medical History:  Diagnosis Date  . Anemia 2007    microcytic anemia, baseline hemoglobin 10-11, MCV at baseline 72-77, secondary to iron deficiency  . Arm DVT (deep venous thromboembolism), acute [facility])  September 23, 2009, January 05, 2010    Doppler study  significant with indeterminant age DVT involving the left upper extremity, Doppler performed January 05, 2010 consistent with acute DVT involving the left upper extremity  . Asthma   . Depression   . Hypertension   . Leukopenia 2008    unclear etiology baseline WBC  2.8-3.7  . Lung nodule  June 10, 2008    stable tiny noduke noted along the minor fissure of the right lung on CT angio September 11, 09 -  stable for 2 years and consistent with benign disease  . Pulmonary embolism [facility])  September 07, 2005    her CT angiogram - positive for pulmonary emboli to several branches of the right lower  lobe- relatively small clot burden, clear lung; patient started on Coumadin; CT angiogram on January 10, 2006 showed resolution of previously seen pulmonary emboli with minimal basilar atelectasis  . Schizophrenia [facility])    Social History   Socioeconomic History  . Marital status: Married    Spouse name: Not on file  . Number of children: Not on file  . Years of education: Not on file  . Highest education level: Not on file  Occupational History  . Not on file  Social Needs  . Financial resource strain: Not on file  . Food insecurity:    Worry: Not on file    Inability: Not on file  . Transportation needs:    Medical: Not on file    Non-medical: Not on file  Tobacco Use  . Smoking status: Former Smoker    Packs/day: 0.25    Years: 0.00    Pack years: 0.00    Types: Cigarettes  . Smokeless tobacco: Never Used  . Tobacco comment: Quit x 3 months.  Substance and Sexual Activity  . Alcohol use: Never    Alcohol/week: 0.0 oz    Frequency: Never    Comment: Quit x 4 yrs.  . Drug use: Not Currently    Types: Marijuana  . Sexual activity: Yes    Birth control/protection: None    Comment: tubal  Lifestyle  . Physical activity:    Days per week: Not on file    Minutes per session: Not on file  . Stress: Not on file  Relationships  . Social connections:    Talks on phone: Not on file    Gets together: Not on file    Attends religious service: Not on file    Active member of club or organization: Not on file    Attends meetings of clubs or organizations: Not on file    Relationship status: Not on file  Other Topics Concern  . 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 Hypertension Mother   . Congestive Heart Failure Mother   . Diabetes Father   . Birth defects Maternal  Aunt   . Birth defects Maternal Uncle   . Diabetes Paternal Grandmother   . Breast cancer Sister   . Breast cancer Sister     ASSESSMENT Recent Results: The most recent result is correlated with 100 mg per week: Lab Results  Component Value Date   INR 3.0 03/30/2018   INR 2.2 03/03/2018   INR 2.10 02/02/2018    Anticoagulation Dosing: Description   Take three (3) tablets of your 5mg   peach-colored warfarin tablets by mouth, once-daily at [facility] each day--EXCEPT on Mondays, Wednesdays and Fridays--on these days, take ONLY 2 & 1/2 tablets.      INR today: Therapeutic  PLAN Weekly dose was decreased by 2.5 % to 97.5 mg per week  Patient Instructions  Patient instructed to take medications as defined in the Anti-coagulation Track section of this encounter.  Patient instructed to take today's dose.  Patient instructed to take three (3) tablets of your 5mg  peach-colored warfarin tablets by mouth, once-daily at [facility] each day--EXCEPT on Mondays, Wednesdays and Fridays--on these days, take ONLY 2 & 1/2 tablets. Patient verbalized understanding of these instructions.     Patient advised to contact clinic or seek medical attention if signs/symptoms of bleeding or thromboembolism occur.  Patient verbalized understanding by repeating back information and was advised to contact me if further medication-related questions arise. Patient was also provided an information handout.  Follow-up Return in 1 month (on 04/27/2018) for Follow up INR at 0945h.  Pennie Banter, Pharm, CACP, CPP  15 minutes spent face-to-face with the patient during the encounter. 50% of time spent on education. 50% of time was spent on fingerstick point of care INR sample collection, processing, results determination, dose adjustment and documentation in [email or web address].

## 2018-03-30 NOTE — Progress Notes (Signed)
INTERNAL MEDICINE TEACHING ATTENDING ADDENDUM - Lucious Groves, DO Duration- indefinate, Indication- Hypercoag state, Hx VTE, INR-  Lab Results  Component Value Date   INR 3.0 03/30/2018  . Agree with pharmacy recommendations as outlined in their note.

## 2018-03-31 LAB — VITAMIN B12: Vitamin B-12: 422 pg/mL (ref 232–1245)

## 2018-04-01 ENCOUNTER — Encounter: Payer: Self-pay | Admitting: Internal Medicine

## 2018-04-01 LAB — CYTOLOGY - PAP
Adequacy: ABSENT
Diagnosis: NEGATIVE
HPV (WINDOPATH): NOT DETECTED

## 2018-04-03 ENCOUNTER — Encounter (HOSPITAL_COMMUNITY): Payer: Self-pay | Admitting: Emergency Medicine

## 2018-04-03 ENCOUNTER — Other Ambulatory Visit: Payer: Self-pay

## 2018-04-03 ENCOUNTER — Emergency Department (HOSPITAL_COMMUNITY)
Admission: EM | Admit: 2018-04-03 | Discharge: 2018-04-03 | Disposition: A | Discharge status: Home / Self Care | Payer: Medicaid Other | Attending: Emergency Medicine | Admitting: Emergency Medicine

## 2018-04-03 DIAGNOSIS — Z87891 Personal history of nicotine dependence: Secondary | ICD-10-CM | POA: Insufficient documentation

## 2018-04-03 DIAGNOSIS — I10 Essential (primary) hypertension: Secondary | ICD-10-CM | POA: Insufficient documentation

## 2018-04-03 DIAGNOSIS — R21 Rash and other nonspecific skin eruption: Secondary | ICD-10-CM | POA: Diagnosis not present

## 2018-04-03 DIAGNOSIS — Z79899 Other long term (current) drug therapy: Secondary | ICD-10-CM | POA: Insufficient documentation

## 2018-04-03 DIAGNOSIS — Z7901 Long term (current) use of anticoagulants: Secondary | ICD-10-CM | POA: Insufficient documentation

## 2018-04-03 DIAGNOSIS — Z86718 Personal history of other venous thrombosis and embolism: Secondary | ICD-10-CM | POA: Diagnosis not present

## 2018-04-03 DIAGNOSIS — J45909 Unspecified asthma, uncomplicated: Secondary | ICD-10-CM | POA: Insufficient documentation

## 2018-04-03 MED ORDER — HYDROXYZINE HCL 25 MG PO TABS: 25.0000 mg | ORAL_TABLET | Freq: Four times a day (QID) | ORAL | 0 refills | Status: DC

## 2018-04-03 MED ORDER — PREDNISONE 20 MG PO TABS
60.0000 mg | ORAL_TABLET | Freq: Once | ORAL | Status: AC
  Administered 2018-04-03: 60 mg via ORAL
  Filled 2018-04-03: qty 3

## 2018-04-03 MED ORDER — METHYLPREDNISOLONE 4 MG PO TBPK: ORAL_TABLET | ORAL | 0 refills | Status: DC

## 2018-04-03 NOTE — ED Provider Notes (Signed)
[facility] EMERGENCY DEPARTMENT Provider Note   CSN: [id number] Arrival date & time: 04/03/18  1005     History   Chief Complaint Chief Complaint  Patient presents with  . Rash    HPI Michele Gillespie is a 42 y.o. female.  Michele Gillespie is a 42 y.o. Female with a history of hypertension, PE and DVT, anemia and schizophrenia, who presents to the emergency department for evaluation of rash over the anterior neck and upper chest reports she first noticed the rash during the spring and thought it was related to a new perfume she did use, she threw the perfume away but reports the rashes persisted, she also talked it up to heat since it started just as the weather was getting warm and has been persisting in the heat, she reports she is putting putting alcohol on it without improvement.  She is also tried over-the-counter Benadryl and hydrocortisone creams without improvement.  She reports the rash is becoming more and more itchy and is occasionally painful.  She reports she has been scratching it and it just will not go away.  She has not followed up with her primary care doctor regarding this, reports no drainage from the rash, no surrounding redness, no fevers, myalgias, nausea or vomiting.  No petechiae.  No new medications, or other household products aside from the perfume which she is discontinued using.  Patient denies any associated facial swelling, difficulty breathing or throat closing sensation, no prior history of anaphylaxis, no known allergies.     Past Medical History:  Diagnosis Date  . Anemia 2007    microcytic anemia, baseline hemoglobin 10-11, MCV at baseline 72-77, secondary to iron deficiency  . Arm DVT (deep venous thromboembolism), acute [facility])  September 23, 2009, January 05, 2010    Doppler study significant with indeterminant age DVT involving the left upper extremity, Doppler performed January 05, 2010 consistent with acute DVT involving the left upper  extremity  . Asthma   . Depression   . Hypertension   . Leukopenia 2008    unclear etiology baseline WBC  2.8-3.7  . Lung nodule  June 10, 2008    stable tiny noduke noted along the minor fissure of the right lung on CT angio September 11, 09 -  stable for 2 years and consistent with benign disease  . Pulmonary embolism [facility])  September 07, 2005    her CT angiogram - positive for pulmonary emboli to several branches of the right lower lobe- relatively small clot burden, clear lung; patient started on Coumadin; CT angiogram on January 10, 2006 showed resolution of previously seen pulmonary emboli with minimal basilar atelectasis  . Schizophrenia [facility])     Patient Active Problem List   Diagnosis Date Noted  . Memory loss 03/30/2018  . Headache in back of head 03/26/2018  . Sore throat 03/26/2018  . Ganglion cyst of volar aspect of left wrist 03/26/2018  . Deviated septum 03/26/2018  . Heliotrope eyelid rash ([place]) 03/06/2018  . Chest wall tenderness under left breast 03/06/2018  . Hypertension 11/21/2017  . Herpes zoster 05/12/2017  . Acute deep vein thrombosis (DVT) of brachial vein of left upper extremity ([place]) 12/16/2016  . Right leg pain 09/09/2016  . Depression 06/20/2016  . Preventative health care 06/20/2016  . Pelvic mass in female 06/20/2016  . Chest pain 02/15/2016  . Secondary amenorrhea 09/14/2014  . Allergic rhinitis with Asthma. 09/14/2014  . GERD (gastroesophageal reflux disease) 09/14/2013  .  Breast mass, right 09/14/2013  . Healthcare maintenance 09/14/2013  . Cervical mass 02/08/2013  . H/O cesarean section 12/21/2012  . Tobacco use disorder 06/22/2012  . Long term current use of anticoagulant 10/20/2010  . DEPRESSION, HX OF 05/16/2010  . Iron deficiency anemia 10/12/2006  . LEUKOCYTOPENIA NOS 10/12/2006  . HYPERCOAGULABLE STATE, PRIMARY 10/12/2006    Past Surgical History:  Procedure Laterality Date  . CESAREAN SECTION      History of 5 C-section  .  EXPLORATORY LAPAROTOMY WITH ABDOMINAL MASS EXCISION  02/2005  . TUBAL LIGATION       OB History    Gravida  5   Para  4   Term  4   Preterm  0   AB  0   Living  5     SAB  0   TAB  0   Ectopic  0   Multiple  0   Live Births               Home Medications    Prior to Admission medications   Medication Sig Start Date End Date Taking? Authorizing Provider  acetaminophen (TYLENOL) 325 MG tablet Take 650 mg by mouth every 6 (six) hours as needed for mild pain.    [provider]  albuterol (PROVENTIL HFA) 108 (90 Base) MCG/ACT inhaler INHALE 2 PUFFS INTO THE LUNGS EVERY 6 HOURS AS NEEDED FOR WHEEZING. FOR SHORTNESS OF BREATH Patient taking differently: Inhale 2 puffs into the lungs every 6 (six) hours as needed for shortness of breath.  12/11/16   Ophelia Shoulder, MD  amLODipine (NORVASC) 5 MG tablet Take 1 tablet (5 mg total) by mouth daily. 11/21/17 11/21/18  Ledell Noss, MD  ARIPiprazole (ABILIFY) 10 MG tablet Take 1 tablet (10 mg total) by mouth daily. 03/06/18   Colbert Ewing, MD  cyclobenzaprine (FLEXERIL) 10 MG tablet Take 1 tablet (10 mg total) 2 (two) times daily as needed by mouth for muscle spasms. 08/07/17   Domenic Moras, PA-C  diclofenac sodium (VOLTAREN) 1 % GEL Apply 2 g topically 4 (four) times daily. 02/14/17   Ophelia Shoulder, MD  HYDROcodone-acetaminophen (NORCO/VICODIN) 5-325 MG tablet Take 2 tablets by mouth every 4 (four) hours as needed. 01/25/17   Esaw Grandchild, MD  nicotine polacrilex (NICORETTE) 2 MG gum RX #3 Weeks 10-12: 1 piece every 4-8 hours. Max 24 pieces per day. 10/20/15   Emokpae, Ejiroghene E, MD  Olopatadine HCl (PATADAY) 0.2 % SOLN Apply 1 drop to eye daily. 03/02/18   Neva Seat, MD  omeprazole (PRILOSEC) 20 MG capsule Take 1 capsule (20 mg total) by mouth daily. 01/28/18   Neva Seat, MD  ondansetron (ZOFRAN-ODT) 4 MG disintegrating tablet Take 1 tablet (4 mg total) by mouth every 8 (eight) hours as needed for nausea or  vomiting. 12/16/16   Norman Herrlich, MD  predniSONE (DELTASONE) 20 MG tablet 2 tabs po daily x 4 days 08/07/17   Domenic Moras, PA-C  traMADol (ULTRAM) 50 MG tablet Take 1 tablet (50 mg total) every 6 (six) hours as needed by mouth. 08/10/17   Shary Decamp, PA-C  warfarin (COUMADIN) 5 MG tablet Take 3 tablets on Thursdays. All other days, take 2&1/2 tablets. 01/28/18   Neva Seat, MD    Family History Family History  Problem Relation Age of Onset  . Hypertension Mother   . Congestive Heart Failure Mother   . Diabetes Father   . Birth defects Maternal Aunt   . Birth defects Maternal Uncle   .  Diabetes Paternal Grandmother   . Breast cancer Sister   . Breast cancer Sister     Social History Social History   Tobacco Use  . Smoking status: Former Smoker    Packs/day: 0.25    Years: 0.00    Pack years: 0.00    Types: Cigarettes  . Smokeless tobacco: Never Used  . Tobacco comment: Quit x 3 months.  Substance Use Topics  . Alcohol use: Never    Alcohol/week: 0.0 oz    Frequency: Never    Comment: Quit x 4 yrs.  . Drug use: Not Currently    Types: Marijuana     Allergies   Patient has no known allergies.   Review of Systems Review of Systems  Constitutional: Negative for chills and fever.  Respiratory: Negative for chest tightness, shortness of breath, wheezing and stridor.   Gastrointestinal: Negative for nausea and vomiting.  Musculoskeletal: Negative for arthralgias, joint swelling and myalgias.  Skin: Positive for rash. Negative for color change and wound.     Physical Exam Updated Vital Signs BP (!) 123/92 (BP Location: Right Arm)   Pulse 77   Temp 99.4 F (37.4 C)   Resp 17   Ht 5' 4.5" (1.638 m)   Wt 101.2 kg (223 lb)   LMP 03/12/2018   SpO2 99%   BMI 37.69 kg/m   Physical Exam  Constitutional: She appears well-developed and well-nourished. No distress.  HENT:  Head: Normocephalic and atraumatic.  Mouth/Throat: Oropharynx is clear and moist.    No facial swelling  Eyes: Right eye exhibits no discharge. Left eye exhibits no discharge.  Neck: Neck supple.  No stridor  Pulmonary/Chest: Effort normal and breath sounds normal. No stridor. No respiratory distress. She has no wheezes. She has no rales.  Respirations equal and unlabored, patient able to speak in full sentences, lungs clear to auscultation bilaterally  Neurological: She is alert. Coordination normal.  Skin: Rash noted. She is not diaphoretic.  Faint rash with small raised bumps noted over the anterior neck and upper chest, with some excoriations through the rash, no overlying erythema or induration, no palpable areas of fluctuance, no vesicular or pustular lesions, no petechiae  Psychiatric: She has a normal mood and affect. Her behavior is normal.  Nursing note and vitals reviewed.    ED Treatments / Results  Labs (all labs ordered are listed, but only abnormal results are displayed) Labs Reviewed - No data to display  EKG None  Radiology No results found.  Procedures Procedures (including critical care time)  Medications Ordered in ED Medications  predniSONE (DELTASONE) tablet 60 mg (60 mg Oral Given 04/03/18 1213)     Initial Impression / Assessment and Plan / ED Course  I have reviewed the triage vital signs and the nursing notes.  Pertinent labs & imaging results that were available during my care of the patient were reviewed by me and considered in my medical decision making (see chart for details).  Rash most consistent with dermatitis. Patient denies any difficulty breathing or swallowing.  Pt has a patent airway without stridor and is handling secretions without difficulty; no angioedema. No blisters, no pustules, no warmth, no draining sinus tracts, no superficial abscesses, no bullous impetigo, no vesicles, no desquamation, no target lesions with dusky purpura or a central bulla. Not tender to touch. No concern for superimposed infection. No concern  for SJS, TEN, TSS, tick borne illness, syphilis or other life-threatening condition. Will discharge home with short course of steroids,  hydroxyzine and cool compresses for pruritus   Final Clinical Impressions(s) / ED Diagnoses   Final diagnoses:  Rash    ED Discharge Orders        Ordered    hydrOXYzine (ATARAX/VISTARIL) 25 MG tablet  Every 6 hours     04/03/18 1133    methylPREDNISolone (MEDROL DOSEPAK) 4 MG TBPK tablet     04/03/18 1133       Benedetto Goad [place], [place] 04/03/18 1244    Malvin Johns, MD 04/03/18 1523

## 2018-04-03 NOTE — ED Notes (Signed)
Pt c/o rash on neck and chest since spring, stating that she thought it was a perfume she wore however she threw it away and the rash continued to progress, reports using benadryl cream with no relief.

## 2018-04-03 NOTE — Discharge Instructions (Addendum)
Please take steroid Dosepak as directed.  You may use hydroxyzine as needed for itching as well as cool compresses over the area.  If this is not improving with steroids it is extremely important that you follow-up with your primary care doctor for continued evaluation.  Return to the emergency department if you develop fevers, drainage or worsening redness around the area, or any small areas that look like bruising.

## 2018-04-03 NOTE — ED Triage Notes (Signed)
Pt. Stated, Michele Gillespie had this raised rash on my neck area for a month. Tried all kinds of OTC med. Not work. Now it hurts and burns and itch.

## 2018-04-08 ENCOUNTER — Ambulatory Visit: Payer: Medicaid Other

## 2018-04-09 ENCOUNTER — Other Ambulatory Visit: Payer: Self-pay

## 2018-04-09 ENCOUNTER — Encounter: Payer: Self-pay | Admitting: Internal Medicine

## 2018-04-09 ENCOUNTER — Ambulatory Visit: Payer: Medicaid Other | Admitting: Internal Medicine

## 2018-04-09 VITALS — BP 120/80 | HR 84 | Temp 98.4°F | Ht 64.5 in | Wt 227.2 lb

## 2018-04-09 DIAGNOSIS — I10 Essential (primary) hypertension: Secondary | ICD-10-CM

## 2018-04-09 DIAGNOSIS — Z79899 Other long term (current) drug therapy: Secondary | ICD-10-CM | POA: Diagnosis not present

## 2018-04-09 DIAGNOSIS — M3399 Dermatopolymyositis, unspecified with other organ involvement: Secondary | ICD-10-CM | POA: Diagnosis not present

## 2018-04-09 DIAGNOSIS — M3390 Dermatopolymyositis, unspecified, organ involvement unspecified: Secondary | ICD-10-CM

## 2018-04-09 MED ORDER — AMLODIPINE BESYLATE 5 MG PO TABS: 5.0000 mg | ORAL_TABLET | Freq: Every day | ORAL | 11 refills | Status: DC

## 2018-04-09 NOTE — Patient Instructions (Signed)
Thank you for allowing [place] to care for you  For your hypertension: - You were provided a refill of amlodipine  You have been provided letters for social security office and your GED  We are getting more lab work today you will be contacted with the results  Please follow up in about 6 months or sooner if needed

## 2018-04-09 NOTE — Assessment & Plan Note (Signed)
BP today is well controlled at 120/80. Will continue current regimen and provide refill today. - Continue Amlodipine 5mg  Daily

## 2018-04-09 NOTE — Assessment & Plan Note (Addendum)
Patient is being worked up for dermatomyositis as an etiology for her heliotrope rash. She has a biopsy scheduled through dermatology on 04/15/18. She also will benefit from additional lab test not ordered at previous visit as it was not listed. This anti MI-2 AB will be order as miscellaneous send out. - Anti Mi-2 Antibody   ADDENDUM: CK and Anti-Jo negative; ANA weakly positive with 1:160 speckled pattern (non specific); anti Mi-2 Ab negative. Patient will be contacted with these results.  Patient is following with Dermatology at [facility]
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
## 2018-04-10 LAB — OTHER LAB TEST

## 2018-04-10 NOTE — Progress Notes (Signed)
Internal Medicine Clinic Attending  Case discussed with Dr. Melvin  at the time of the visit.  We reviewed the resident's history and exam and pertinent patient test results.  I agree with the assessment, diagnosis, and plan of care documented in the resident's note.  

## 2018-04-16 LAB — MI-2 ANTIBODIES

## 2018-04-18 ENCOUNTER — Other Ambulatory Visit: Payer: Self-pay

## 2018-04-18 ENCOUNTER — Emergency Department (HOSPITAL_COMMUNITY)
Admission: EM | Admit: 2018-04-18 | Discharge: 2018-04-18 | Disposition: A | Discharge status: Home / Self Care | Payer: Medicaid Other | Attending: Emergency Medicine | Admitting: Emergency Medicine

## 2018-04-18 ENCOUNTER — Emergency Department (HOSPITAL_COMMUNITY): Payer: Medicaid Other

## 2018-04-18 ENCOUNTER — Encounter (HOSPITAL_COMMUNITY): Payer: Self-pay | Admitting: Emergency Medicine

## 2018-04-18 DIAGNOSIS — Z79899 Other long term (current) drug therapy: Secondary | ICD-10-CM | POA: Diagnosis not present

## 2018-04-18 DIAGNOSIS — I10 Essential (primary) hypertension: Secondary | ICD-10-CM | POA: Diagnosis not present

## 2018-04-18 DIAGNOSIS — M545 Low back pain, unspecified: Secondary | ICD-10-CM

## 2018-04-18 DIAGNOSIS — M5489 Other dorsalgia: Secondary | ICD-10-CM | POA: Diagnosis present

## 2018-04-18 DIAGNOSIS — K5900 Constipation, unspecified: Secondary | ICD-10-CM | POA: Diagnosis not present

## 2018-04-18 DIAGNOSIS — K5790 Diverticulosis of intestine, part unspecified, without perforation or abscess without bleeding: Secondary | ICD-10-CM | POA: Diagnosis present

## 2018-04-18 DIAGNOSIS — Z7901 Long term (current) use of anticoagulants: Secondary | ICD-10-CM | POA: Diagnosis not present

## 2018-04-18 DIAGNOSIS — J45909 Unspecified asthma, uncomplicated: Secondary | ICD-10-CM | POA: Insufficient documentation

## 2018-04-18 LAB — COMPREHENSIVE METABOLIC PANEL
ALT: 38 U/L (ref 0–44)
ANION GAP: 7 (ref 5–15)
AST: 39 U/L (ref 15–41)
Albumin: 3.3 g/dL — ABNORMAL LOW (ref 3.5–5.0)
Alkaline Phosphatase: 45 U/L (ref 38–126)
BUN: 17 mg/dL (ref 6–20)
CO2: 25 mmol/L (ref 22–32)
Calcium: 9.1 mg/dL (ref 8.9–10.3)
Chloride: 107 mmol/L (ref 98–111)
Creatinine, Ser: 1.02 mg/dL — ABNORMAL HIGH (ref 0.44–1.00)
GFR calc Af Amer: >60 mL/min (ref >60)
Glucose, Bld: 95 mg/dL (ref 70–99)
POTASSIUM: 4.6 mmol/L (ref 3.5–5.1)
Sodium: 139 mmol/L (ref 135–145)
Total Bilirubin: 0.9 mg/dL (ref 0.3–1.2)
Total Protein: 6.6 g/dL (ref 6.5–8.1)

## 2018-04-18 LAB — CBC WITH DIFFERENTIAL/PLATELET
ABS IMMATURE GRANULOCYTES: 0 10*3/uL (ref 0.0–0.1)
BASOS ABS: 0 10*3/uL (ref 0.0–0.1)
BASOS PCT: 1 %
EOS ABS: 0.1 10*3/uL (ref 0.0–0.7)
EOS PCT: 2 %
HCT: 42.8 % (ref 36.0–46.0)
Hemoglobin: 13.4 g/dL (ref 12.0–15.0)
Immature Granulocytes: 0 %
Lymphocytes Relative: 44 %
Lymphs Abs: 1.4 10*3/uL (ref 0.7–4.0)
MCH: 25.3 pg — ABNORMAL LOW (ref 26.0–34.0)
MCHC: 31.3 g/dL (ref 30.0–36.0)
MCV: 80.8 fL (ref 78.0–100.0)
MONO ABS: 0.4 10*3/uL (ref 0.1–1.0)
MONOS PCT: 11 %
NEUTROS ABS: 1.3 10*3/uL — AB (ref 1.7–7.7)
Neutrophils Relative %: 42 %
PLATELETS: 192 10*3/uL (ref 150–400)
RBC: 5.3 MIL/uL — ABNORMAL HIGH (ref 3.87–5.11)
RDW: 13.7 % (ref 11.5–15.5)
WBC: 3.2 10*3/uL — ABNORMAL LOW (ref 4.0–10.5)

## 2018-04-18 LAB — URINALYSIS, ROUTINE W REFLEX MICROSCOPIC
BACTERIA UA: NONE SEEN
BILIRUBIN URINE: NEGATIVE
GLUCOSE, UA: NEGATIVE mg/dL
KETONES UR: NEGATIVE mg/dL
LEUKOCYTES UA: NEGATIVE
NITRITE: NEGATIVE
PROTEIN: NEGATIVE mg/dL
Specific Gravity, Urine: 1.024 (ref 1.005–1.030)
pH: 5 (ref 5.0–8.0)

## 2018-04-18 LAB — LIPASE, BLOOD: LIPASE: 29 U/L (ref 11–51)

## 2018-04-18 LAB — PROTIME-INR
INR: 2.8
PROTHROMBIN TIME: 29.3 s — AB (ref 11.4–15.2)

## 2018-04-18 LAB — PREGNANCY, URINE: Preg Test, Ur: NEGATIVE

## 2018-04-18 MED ORDER — IOHEXOL 300 MG/ML  SOLN
100.0000 mL | Freq: Once | INTRAMUSCULAR | Status: AC | PRN
  Administered 2018-04-18: 100 mL via INTRAVENOUS

## 2018-04-18 MED ORDER — DIAZEPAM 5 MG PO TABS
5.0000 mg | ORAL_TABLET | Freq: Once | ORAL | Status: AC
  Administered 2018-04-18: 5 mg via ORAL
  Filled 2018-04-18: qty 1

## 2018-04-18 MED ORDER — METHOCARBAMOL 750 MG PO TABS: 750.0000 mg | ORAL_TABLET | Freq: Three times a day (TID) | ORAL | 0 refills | Status: DC | PRN

## 2018-04-18 NOTE — ED Notes (Signed)
Pt understood dc material. NAD noted. Script given at dc  

## 2018-04-18 NOTE — ED Provider Notes (Signed)
[facility] EMERGENCY DEPARTMENT Provider Note   CSN: [id number] Arrival date & time: 04/18/18  1721     History   Chief Complaint Chief Complaint  Patient presents with  . Back Pain    HPI Michele Gillespie is a 42 y.o. female with past medical history of schizophrenia, anticoagulated with warfarin, who presents today for evaluation of bilateral lower back pain since yesterday.  She reports that she was walking to her car when she had sudden onset of bilateral lower back pain.  She reports that she is also having right upper quadrant abdominal pain.  She has not had any fevers here.  She has tried salonpas, tylenol, and rest with out relief.  Her pain is made worse with movement, deep breathing she denies any personal history of cancer, changes to bowel or bladder function, or IV drug use.  HPI  Past Medical History:  Diagnosis Date  . Anemia 2007    microcytic anemia, baseline hemoglobin 10-11, MCV at baseline 72-77, secondary to iron deficiency  . Arm DVT (deep venous thromboembolism), acute [facility])  September 23, 2009, January 05, 2010    Doppler study significant with indeterminant age DVT involving the left upper extremity, Doppler performed January 05, 2010 consistent with acute DVT involving the left upper extremity  . Asthma   . Depression   . Hypertension   . Leukopenia 2008    unclear etiology baseline WBC  2.8-3.7  . Lung nodule  June 10, 2008    stable tiny noduke noted along the minor fissure of the right lung on CT angio September 11, 09 -  stable for 2 years and consistent with benign disease  . Pulmonary embolism [facility])  September 07, 2005    her CT angiogram - positive for pulmonary emboli to several branches of the right lower lobe- relatively small clot burden, clear lung; patient started on Coumadin; CT angiogram on January 10, 2006 showed resolution of previously seen pulmonary emboli with minimal basilar atelectasis  . Schizophrenia [facility])      Patient Active Problem List   Diagnosis Date Noted  . Diverticulosis 04/18/2018  . Memory loss 03/30/2018  . Headache in back of head 03/26/2018  . Sore throat 03/26/2018  . Ganglion cyst of volar aspect of left wrist 03/26/2018  . Deviated septum 03/26/2018  . Heliotrope eyelid rash ([place]) 03/06/2018  . Chest wall tenderness under left breast 03/06/2018  . Hypertension 11/21/2017  . Herpes zoster 05/12/2017  . Acute deep vein thrombosis (DVT) of brachial vein of left upper extremity ([place]) 12/16/2016  . Right leg pain 09/09/2016  . Depression 06/20/2016  . Preventative health care 06/20/2016  . Pelvic mass in female 06/20/2016  . Chest pain 02/15/2016  . Secondary amenorrhea 09/14/2014  . Allergic rhinitis with Asthma. 09/14/2014  . GERD (gastroesophageal reflux disease) 09/14/2013  . Breast mass, right 09/14/2013  . Healthcare maintenance 09/14/2013  . Cervical mass 02/08/2013  . H/O cesarean section 12/21/2012  . Tobacco use disorder 06/22/2012  . Long term current use of anticoagulant 10/20/2010  . DEPRESSION, HX OF 05/16/2010  . Iron deficiency anemia 10/12/2006  . LEUKOCYTOPENIA NOS 10/12/2006  . HYPERCOAGULABLE STATE, PRIMARY 10/12/2006    Past Surgical History:  Procedure Laterality Date  . CESAREAN SECTION      History of 5 C-section  . EXPLORATORY LAPAROTOMY WITH ABDOMINAL MASS EXCISION  02/2005  . TUBAL LIGATION       OB History    Gravida  5  Para  4   Term  4   Preterm  0   AB  0   Living  5     SAB  0   TAB  0   Ectopic  0   Multiple  0   Live Births               Home Medications    Prior to Admission medications   Medication Sig Start Date End Date Taking? Authorizing Provider  acetaminophen (TYLENOL) 325 MG tablet Take 650 mg by mouth every 6 (six) hours as needed for mild pain.    [provider]  albuterol (PROVENTIL HFA) 108 (90 Base) MCG/ACT inhaler INHALE 2 PUFFS INTO THE LUNGS EVERY 6 HOURS AS NEEDED FOR  WHEEZING. FOR SHORTNESS OF BREATH Patient taking differently: Inhale 2 puffs into the lungs every 6 (six) hours as needed for shortness of breath.  12/11/16   Taylor, James, MD  amLODipine (NORVASC) 5 MG tablet Take 1 tablet (5 mg total) by mouth daily. 04/09/18 04/09/19  Melvin, Alexander, MD  ARIPiprazole (ABILIFY) 10 MG tablet Take 1 tablet (10 mg total) by mouth daily. 03/06/18   Huang, Jennifer, MD  cyclobenzaprine (FLEXERIL) 10 MG tablet Take 1 tablet (10 mg total) 2 (two) times daily as needed by mouth for muscle spasms. 08/07/17   Tran, Bowie, PA-C  diclofenac sodium (VOLTAREN) 1 % GEL Apply 2 g topically 4 (four) times daily. 02/14/17   Taylor, James, MD  HYDROcodone-acetaminophen (NORCO/VICODIN) 5-325 MG tablet Take 2 tablets by mouth every 4 (four) hours as needed. 01/25/17   Seymore, Andrew, MD  hydrOXYzine (ATARAX/VISTARIL) 25 MG tablet Take 1 tablet (25 mg total) by mouth every 6 (six) hours. 04/03/18   Ford, Kelsey N, PA-C  methocarbamol (ROBAXIN) 750 MG tablet Take 1-2 tablets (750-1,500 mg total) by mouth 3 (three) times daily as needed for muscle spasms. 04/18/18   Hammond, Elizabeth W, PA-C  methylPREDNISolone (MEDROL DOSEPAK) 4 MG TBPK tablet Take as directed 04/03/18   Ford, Kelsey N, PA-C  nicotine polacrilex (NICORETTE) 2 MG gum RX #3 Weeks 10-12: 1 piece every 4-8 hours. Max 24 pieces per day. 10/20/15   Emokpae, Ejiroghene E, MD  Olopatadine HCl (PATADAY) 0.2 % SOLN Apply 1 drop to eye daily. 03/02/18   Melvin, Alexander, MD  omeprazole (PRILOSEC) 20 MG capsule Take 1 capsule (20 mg total) by mouth daily. 01/28/18   Melvin, Alexander, MD  ondansetron (ZOFRAN-ODT) 4 MG disintegrating tablet Take 1 tablet (4 mg total) by mouth every 8 (eight) hours as needed for nausea or vomiting. 12/16/16   Truong, Diana M, MD  predniSONE (DELTASONE) 20 MG tablet 2 tabs po daily x 4 days 08/07/17   Tran, Bowie, PA-C  traMADol (ULTRAM) 50 MG tablet Take 1 tablet (50 mg total) every 6 (six) hours as needed by  mouth. 08/10/17   Mohr, Tyler, PA-C  warfarin (COUMADIN) 5 MG tablet Take 3 tablets on Thursdays. All other days, take 2&1/2 tablets. 01/28/18   Melvin, Alexander, MD    Family History Family History  Problem Relation Age of Onset  . Hypertension Mother   . Congestive Heart Failure Mother   . Diabetes Father   . Birth defects Maternal Aunt   . Birth defects Maternal Uncle   . Diabetes Paternal Grandmother   . Breast cancer Sister   . Breast cancer Sister     Social History Social History   Tobacco Use  . Smoking status: Former Smoker      Packs/day: 0.25    Years: 0.00    Pack years: 0.00    Types: Cigarettes  . Smokeless tobacco: Never Used  . Tobacco comment: Quit x 3 months.  Substance Use Topics  . Alcohol use: Never    Alcohol/week: 0.0 oz    Frequency: Never    Comment: Quit x 4 yrs.  . Drug use: Not Currently    Types: Marijuana     Allergies   Patient has no known allergies.   Review of Systems Review of Systems  Constitutional: Negative for chills and fever.  Gastrointestinal: Positive for abdominal pain. Negative for nausea and vomiting.  Musculoskeletal: Positive for back pain. Negative for neck pain.  All other systems reviewed and are negative.    Physical Exam Updated Vital Signs BP (!) 127/94   Pulse 78   Temp 99 F (37.2 C) (Oral)   Resp 18   SpO2 96%   Physical Exam  Constitutional: She appears well-developed and well-nourished. No distress.  HENT:  Head: Normocephalic and atraumatic.  Eyes: Conjunctivae are normal. Right eye exhibits no discharge. Left eye exhibits no discharge. No scleral icterus.  Neck: Normal range of motion.  Cardiovascular: Normal rate, regular rhythm and intact distal pulses.  Pulmonary/Chest: Effort normal and breath sounds normal. No stridor. No respiratory distress.  Abdominal: She exhibits no distension. There is tenderness (Mild, RUQ).  Musculoskeletal: She exhibits no edema or deformity.  There is TTP  over the bilateral lumbar back.  Palpation here is tender, however this does not fully re-create or exacerbate her reported pain.  There is no midline L-spine tenderness to palpation step-offs or deformities.  Neurological: She is alert. She exhibits normal muscle tone.  Skin: Skin is warm and dry. She is not diaphoretic.  Psychiatric: She has a normal mood and affect. Her behavior is normal.  Nursing note and vitals reviewed.    ED Treatments / Results  Labs (all labs ordered are listed, but only abnormal results are displayed) Labs Reviewed  URINALYSIS, ROUTINE W REFLEX MICROSCOPIC - Abnormal; Notable for the following components:      Result Value   Hgb urine dipstick MODERATE (*)    All other components within normal limits  CBC WITH DIFFERENTIAL/PLATELET - Abnormal; Notable for the following components:   WBC 3.2 (*)    RBC 5.30 (*)    MCH 25.3 (*)    Neutro Abs 1.3 (*)    All other components within normal limits  COMPREHENSIVE METABOLIC PANEL - Abnormal; Notable for the following components:   Creatinine, Ser 1.02 (*)    Albumin 3.3 (*)    All other components within normal limits  PROTIME-INR - Abnormal; Notable for the following components:   Prothrombin Time 29.3 (*)    All other components within normal limits  PREGNANCY, URINE  LIPASE, BLOOD    EKG None  Radiology Ct Abdomen Pelvis W Contrast  Result Date: 04/18/2018 CLINICAL DATA:  42-year-old female with lower back pain. EXAM: CT ABDOMEN AND PELVIS WITH CONTRAST TECHNIQUE: Multidetector CT imaging of the abdomen and pelvis was performed using the standard protocol following bolus administration of intravenous contrast. CONTRAST:  100mL OMNIPAQUE IOHEXOL 300 MG/ML  SOLN COMPARISON:  CT of the abdomen pelvis dated 03/14/2013 and pelvic MRI dated 08/16/2017. FINDINGS: Lower chest: The visualized lung bases are clear. No intra-abdominal free air or free fluid. Hepatobiliary: No focal liver abnormality is seen. No  gallstones, gallbladder wall thickening, or biliary dilatation. Pancreas: Unremarkable. No pancreatic ductal dilatation   or surrounding inflammatory changes. Spleen: Normal in size without focal abnormality. Adrenals/Urinary Tract: Adrenal glands are unremarkable. Kidneys are normal, without renal calculi, focal lesion, or hydronephrosis. Bladder is unremarkable. Stomach/Bowel: There is sigmoid diverticulosis without active inflammatory changes. Moderate stool noted throughout the colon. No bowel obstruction or active inflammation. Normal appendix. Vascular/Lymphatic: No significant vascular findings are present. No enlarged abdominal or pelvic lymph nodes. Reproductive: The uterus is anteverted and grossly unremarkable. The ovaries are poorly visualized. Other: The anterior pelvic wall C-section scar. Musculoskeletal: No acute or significant osseous findings. IMPRESSION: 1. No acute intra-abdominal or pelvic pathology. Colonic diverticulosis. No bowel obstruction or active inflammation. Normal appendix. 2. Unremarkable appearance of the lumbar spine by CT. Electronically Signed   By: Arash  Radparvar M.D.   On: 04/18/2018 21:34    Procedures Procedures (including critical care time)  Medications Ordered in ED Medications  diazepam (VALIUM) tablet 5 mg (5 mg Oral Given 04/18/18 1946)  iohexol (OMNIPAQUE) 300 MG/ML solution 100 mL (100 mLs Intravenous Contrast Given 04/18/18 2050)     Initial Impression / Assessment and Plan / ED Course  I have reviewed the triage vital signs and the nursing notes.  Pertinent labs & imaging results that were available during my care of the patient were reviewed by me and considered in my medical decision making (see chart for details).    Patient presents today for evaluation of sudden onset of atraumatic bilateral back pain.  Her urine showed a moderate amount of blood, she is anticoagulated with warfarin for a history of multiple PEs.  Unable to re-create her  exacerbate back pain on exam.  X-rays were considered for evaluation, however as her pain is not midline in his bilateral with hematuria, and right upper quadrant pain CT abdomen pelvis is more appropriate to evaluate her multiple complaints.  I am highly suspicious that her pain will end up being musculoskeletal in nature, therefore given Valium for muscle relaxer.  Additional labs were obtained, white count is slightly low at 3.2, recommended PCP follow-up.  Normal AST ALT alk phos and lipase.  CT abdomen pelvis was obtained without evidence of bony acute abnormalities.  Patient was informed that she has diverticulosis.  She was also informed that she appears constipated and needs to start taking MiraLAX and stool softeners to help with her symptoms.  PCP follow-up for her hematuria, back pain, mild decreased white count.  Will give Robaxin for muscle spasms and instructed on Tylenol for her pain.  Recommended PCP follow-up for right upper quadrant pain, is that is not the primary reason why she was here today it was not pursued further given normal CT scan.  Return precautions were discussed, patient discharged home.  Final Clinical Impressions(s) / ED Diagnoses   Final diagnoses:  Acute bilateral low back pain without sciatica  Diverticulosis  Constipation, unspecified constipation type    ED Discharge Orders        Ordered    methocarbamol (ROBAXIN) 750 MG tablet  3 times daily PRN     04/18/18 2152       Hammond, Elizabeth W, PA-C 04/19/18 0012    Pickering, Nathan, MD 04/19/18 0019  

## 2018-04-18 NOTE — ED Triage Notes (Signed)
Pt presents with lower back pain since yesterday, worse with burping and deep inspiration; pt denies urinary changes; denies injury; tried salonpas without relief

## 2018-04-18 NOTE — ED Notes (Signed)
Patient transported to CT 

## 2018-04-18 NOTE — Discharge Instructions (Addendum)
Please start taking MiraLAX.  This will take a couple days for you to have a bowel movement.  You may take 1-2 capfuls a day.  You may take more that however he will be more likely to have painful crampy bowel movements.  Please follow-up with your primary care provider.  Your urine showed that you have a large amount of blood in your urine, and as you are not currently menstruating and/or anticoagulated this needs to be monitored.  Your INR today was 2.8.  Your white blood cell count was also slightly low.  Please follow-up with your primary care doctor about this.  Please take Tylenol (acetaminophen) to relieve your pain.  You may take tylenol, up to 1,000 mg (two extra strength pills).  Do not take more than 3,000 mg tylenol in a 24 hour period.  Please check all medication labels as many medications such as pain and cold medications may contain tylenol. Please do not drink alcohol while taking this medication.   Today you received medications that may make you sleepy or impair your ability to make decisions.  For the next 24 hours please do not drive, operate heavy machinery, care for a small child with out another adult present, or perform any activities that may cause harm to you or someone else if you were to fall asleep or be impaired.   You are being prescribed a medication which may make you sleepy. Please follow up of listed precautions for at least 24 hours after taking one dose.

## 2018-04-20 ENCOUNTER — Telehealth: Payer: Self-pay | Admitting: Internal Medicine

## 2018-04-20 ENCOUNTER — Ambulatory Visit (INDEPENDENT_AMBULATORY_CARE_PROVIDER_SITE_OTHER): Payer: Medicaid Other | Admitting: Internal Medicine

## 2018-04-20 ENCOUNTER — Ambulatory Visit (INDEPENDENT_AMBULATORY_CARE_PROVIDER_SITE_OTHER): Payer: Medicaid Other | Admitting: Pharmacist

## 2018-04-20 ENCOUNTER — Ambulatory Visit: Payer: Medicaid Other

## 2018-04-20 VITALS — BP 138/87 | HR 75 | Temp 98.7°F | Ht 64.5 in | Wt 231.8 lb

## 2018-04-20 DIAGNOSIS — Z86718 Personal history of other venous thrombosis and embolism: Secondary | ICD-10-CM | POA: Diagnosis not present

## 2018-04-20 DIAGNOSIS — Z5181 Encounter for therapeutic drug level monitoring: Secondary | ICD-10-CM | POA: Diagnosis not present

## 2018-04-20 DIAGNOSIS — I10 Essential (primary) hypertension: Secondary | ICD-10-CM

## 2018-04-20 DIAGNOSIS — D509 Iron deficiency anemia, unspecified: Secondary | ICD-10-CM | POA: Diagnosis not present

## 2018-04-20 DIAGNOSIS — D6859 Other primary thrombophilia: Secondary | ICD-10-CM | POA: Diagnosis not present

## 2018-04-20 DIAGNOSIS — R319 Hematuria, unspecified: Secondary | ICD-10-CM

## 2018-04-20 DIAGNOSIS — Z7901 Long term (current) use of anticoagulants: Secondary | ICD-10-CM | POA: Diagnosis not present

## 2018-04-20 DIAGNOSIS — M545 Low back pain, unspecified: Secondary | ICD-10-CM

## 2018-04-20 DIAGNOSIS — R109 Unspecified abdominal pain: Secondary | ICD-10-CM

## 2018-04-20 DIAGNOSIS — K5790 Diverticulosis of intestine, part unspecified, without perforation or abscess without bleeding: Secondary | ICD-10-CM

## 2018-04-20 DIAGNOSIS — Z79891 Long term (current) use of opiate analgesic: Secondary | ICD-10-CM

## 2018-04-20 DIAGNOSIS — Z8739 Personal history of other diseases of the musculoskeletal system and connective tissue: Secondary | ICD-10-CM

## 2018-04-20 LAB — POCT INR: INR: 5.3 — AB (ref 2.0–3.0)

## 2018-04-20 MED ORDER — HYDROCODONE-IBUPROFEN 5-200 MG PO TABS: 1.0000 | ORAL_TABLET | Freq: Three times a day (TID) | ORAL | 0 refills | Status: AC | PRN

## 2018-04-20 MED ORDER — HYDROCODONE-IBUPROFEN 5-200 MG PO TABS: 1.0000 | ORAL_TABLET | Freq: Three times a day (TID) | ORAL | 0 refills | Status: DC | PRN

## 2018-04-20 NOTE — Progress Notes (Signed)
I reviewed Dr. Gladstone Pih note.  Patient has an elevated INR and dose was decreased.

## 2018-04-20 NOTE — Telephone Encounter (Signed)
Called pharmacy, pharmacist states dr Truman Hayward is not medicaid certified, please send a new script for vicoprofen 5/200 #9, they have voided the script from dr Truman Hayward

## 2018-04-20 NOTE — Telephone Encounter (Signed)
Patient called to say script sent to wrong pharmacy earlier today, the one it was sent to did not have med in stock; and controlled substances are not allowed to be transferred between pharmacies.  Resent vicoprofen #9 per last script to [facility] on [address].   Alphonzo Grieve, MD IMTS - PGY3

## 2018-04-20 NOTE — Progress Notes (Signed)
CC: Back and Flank pain  HPI: [name] is a 42 y.o. F w/ PMH of HTN, Iron- deficiency Anemia, Diverticulosis, and DVT on Coumadin presenting to the clinic with complaints of acute back pain. She was in her usual state of health until 04/16/18 when she began to feel acute back pain with no inciting event or trauma. She states the pain has been increasing in severity with radiation toward the left flank. Pain is described as 10/10 'soreness kind of pain' around the spine but also radiating to the left flank. She states the pain is not alleviated by Tylenol or Tramadol and it is exacerbated by coughing, burping and lying on her left side. She states she has never had this kind of pain in her back before and is debilitating enough to interfere with her daily activities and work. She denies any numbness, loss of sensation, incontinence. Denies any nausea/vomiting/diarrhea.  She visited the Emergency Department for this complaint on 04/18/18 and was worked up, diagnosed as musculoskeletal pain and discharged with Robaxin. She states since then, the pain has worsened and Robaxin has not provided relief at all. She was also found to have moderate hematuria on UA and was told to follow up with PCP about the issue.   Past Medical History:  Diagnosis Date  . Anemia 2007    microcytic anemia, baseline hemoglobin 10-11, MCV at baseline 72-77, secondary to iron deficiency  . Arm DVT (deep venous thromboembolism), acute [facility])  September 23, 2009, January 05, 2010    Doppler study significant with indeterminant age DVT involving the left upper extremity, Doppler performed January 05, 2010 consistent with acute DVT involving the left upper extremity  . Asthma   . Depression   . Hypertension   . Leukopenia 2008    unclear etiology baseline WBC  2.8-3.7  . Lung nodule  June 10, 2008    stable tiny noduke noted along the minor fissure of the right lung on CT angio September 11, 09 -  stable for 2 years and  consistent with benign disease  . Pulmonary embolism [facility])  September 07, 2005    her CT angiogram - positive for pulmonary emboli to several branches of the right lower lobe- relatively small clot burden, clear lung; patient started on Coumadin; CT angiogram on January 10, 2006 showed resolution of previously seen pulmonary emboli with minimal basilar atelectasis  . Schizophrenia ([place])    Review of Systems: Review of Systems  Constitutional: Negative for chills, fever and malaise/fatigue.  Cardiovascular: Negative for chest pain, palpitations and orthopnea.  Gastrointestinal: Positive for abdominal pain. Negative for constipation, diarrhea, nausea and vomiting.  Genitourinary: Positive for flank pain and hematuria. Negative for dysuria, frequency and urgency.  Musculoskeletal: Positive for back pain. Negative for falls, joint pain and neck pain.    Physical Exam: Vitals:   04/20/18 1116  BP: 138/87  Pulse: 75  Temp: 98.7 F (37.1 C)  TempSrc: Oral  SpO2: 98%  Weight: 231 lb 12.8 oz (105.1 kg)  Height: 5' 4.5" (1.638 m)   Physical Exam  Constitutional: She is oriented to person, place, and time. She appears well-developed and well-nourished. She appears distressed.  Cardiovascular: Normal rate, normal heart sounds and intact distal pulses.  GI: Soft. Bowel sounds are normal. She exhibits no distension. There is no tenderness. There is no guarding.  Musculoskeletal: Normal range of motion. She exhibits tenderness. She exhibits no edema.  Paraspinal tenderness to palpation bilaterally around L4~L5 region. Also tender  to palpation around left illiac crest region. Negative CVA tenderness  Neurological: She is alert and oriented to person, place, and time. She has normal reflexes. Coordination normal.  No loss of sensation lower extremities     Assessment & Plan:   See Encounters Tab for problem based charting.  Patient seen with Dr. Daryll Drown   -Michele Gillespie, PGY1

## 2018-04-20 NOTE — Telephone Encounter (Signed)
reordered

## 2018-04-20 NOTE — Telephone Encounter (Signed)
Re ordered

## 2018-04-20 NOTE — Patient Instructions (Addendum)
Michele Gillespie,  You came to the clinic with complaints of back pain after an ED visit. We saw that they found blood in your urine and we saw that your INR was above the therapeutic level for warfarin so we recommend that you stop taking your warfarin tomorrow and increase your heartburn medication to twice a day. We are also giving you some pain medication to help with your back pain. Please return to the clinic in 2 days   Low Back Sprain Rehab Ask your health care provider which exercises are safe for you. Do exercises exactly as told by your health care provider and adjust them as directed. It is normal to feel mild stretching, pulling, tightness, or discomfort as you do these exercises, but you should stop right away if you feel sudden pain or your pain gets worse. Do not begin these exercises until told by your health care provider. Stretching and range of motion exercises These exercises warm up your muscles and joints and improve the movement and flexibility of your back. These exercises also help to relieve pain, numbness, and tingling. Exercise A: Lumbar rotation  1. Lie on your back on a firm surface and bend your knees. 2. Straighten your arms out to your sides so each arm forms an "L" shape with a side of your body (a 90 degree angle). 3. Slowly move both of your knees to one side of your body until you feel a stretch in your lower back. Try not to let your shoulders move off of the floor. 4. Hold for __________ seconds. 5. Tense your abdominal muscles and slowly move your knees back to the starting position. 6. Repeat this exercise on the other side of your body. Repeat __________ times. Complete this exercise __________ times a day. Exercise B: Prone extension on elbows  1. Lie on your abdomen on a firm surface. 2. Prop yourself up on your elbows. 3. Use your arms to help lift your chest up until you feel a gentle stretch in your abdomen and your lower back. ? This will place some  of your body weight on your elbows. If this is uncomfortable, try stacking pillows under your chest. ? Your hips should stay down, against the surface that you are lying on. Keep your hip and back muscles relaxed. 4. Hold for __________ seconds. 5. Slowly relax your upper body and return to the starting position. Repeat __________ times. Complete this exercise __________ times a day. Strengthening exercises These exercises build strength and endurance in your back. Endurance is the ability to use your muscles for a long time, even after they get tired. Exercise C: Pelvic tilt 1. Lie on your back on a firm surface. Bend your knees and keep your feet flat. 2. Tense your abdominal muscles. Tip your pelvis up toward the ceiling and flatten your lower back into the floor. ? To help with this exercise, you may place a small towel under your lower back and try to push your back into the towel. 3. Hold for __________ seconds. 4. Let your muscles relax completely before you repeat this exercise. Repeat __________ times. Complete this exercise __________ times a day. Exercise D: Alternating arm and leg raises  1. Get on your hands and knees on a firm surface. If you are on a hard floor, you may want to use padding to cushion your knees, such as an exercise mat. 2. Line up your arms and legs. Your hands should be below your shoulders, and your knees  should be below your hips. 3. Lift your left leg behind you. At the same time, raise your right arm and straighten it in front of you. ? Do not lift your leg higher than your hip. ? Do not lift your arm higher than your shoulder. ? Keep your abdominal and back muscles tight. ? Keep your hips facing the ground. ? Do not arch your back. ? Keep your balance carefully, and do not hold your breath. 4. Hold for __________ seconds. 5. Slowly return to the starting position and repeat with your right leg and your left arm. Repeat __________ times. Complete this  exercise __________ times a day. Exercise E: Abdominal set with straight leg raise  1. Lie on your back on a firm surface. 2. Bend one of your knees and keep your other leg straight. 3. Tense your abdominal muscles and lift your straight leg up, 4-6 inches (10-15 cm) off the ground. 4. Keep your abdominal muscles tight and hold for __________ seconds. ? Do not hold your breath. ? Do not arch your back. Keep it flat against the ground. 5. Keep your abdominal muscles tense as you slowly lower your leg back to the starting position. 6. Repeat with your other leg. Repeat __________ times. Complete this exercise __________ times a day. Posture and body mechanics  Body mechanics refers to the movements and positions of your body while you do your daily activities. Posture is part of body mechanics. Good posture and healthy body mechanics can help to relieve stress in your body's tissues and joints. Good posture means that your spine is in its natural S-curve position (your spine is neutral), your shoulders are pulled back slightly, and your head is not tipped forward. The following are general guidelines for applying improved posture and body mechanics to your everyday activities. Standing   When standing, keep your spine neutral and your feet about hip-width apart. Keep a slight bend in your knees. Your ears, shoulders, and hips should line up.  When you do a task in which you stand in one place for a long time, place one foot up on a stable object that is 2-4 inches (5-10 cm) high, such as a footstool. This helps keep your spine neutral. Sitting   When sitting, keep your spine neutral and keep your feet flat on the floor. Use a footrest, if necessary, and keep your thighs parallel to the floor. Avoid rounding your shoulders, and avoid tilting your head forward.  When working at a desk or a computer, keep your desk at a height where your hands are slightly lower than your elbows. Slide your  chair under your desk so you are close enough to maintain good posture.  When working at a computer, place your monitor at a height where you are looking straight ahead and you do not have to tilt your head forward or downward to look at the screen. Resting   When lying down and resting, avoid positions that are most painful for you.  If you have pain with activities such as sitting, bending, stooping, or squatting (flexion-based activities), lie in a position in which your body does not bend very much. For example, avoid curling up on your side with your arms and knees near your chest (fetal position).  If you have pain with activities such as standing for a long time or reaching with your arms (extension-based activities), lie with your spine in a neutral position and bend your knees slightly. Try the following positions:  Lying on your side with a pillow between your knees.  Lying on your back with a pillow under your knees. Lifting   When lifting objects, keep your feet at least shoulder-width apart and tighten your abdominal muscles.  Bend your knees and hips and keep your spine neutral. It is important to lift using the strength of your legs, not your back. Do not lock your knees straight out.  Always ask for help to lift heavy or awkward objects. This information is not intended to replace advice given to you by your health care provider. Make sure you discuss any questions you have with your health care provider. Document Released: 09/16/2005 Document Revised: 05/23/2016 Document Reviewed: 06/28/2015 Elsevier Interactive Patient Education  2018 [employer].    Ovarian Torsion The ovaries are small organs in the lower abdomen that produce eggs in women. Ovarian torsion is when an ovary becomes twisted and cuts off its own blood supply. In many cases, both the ovary and the fallopian tube on the affected side become twisted (adnexal torsion). This causes the ovary to swell. If  left untreated, the ovary may become infected, which can be painful. Ovarian torsion can happen at any age. Ovarian torsion is a medical emergency that must be treated quickly, generally with surgery. What are the causes? Ovarian torsion is commonly caused by having an ovary that is larger than normal (enlarged). It may also be a complication of pregnancy. In some cases, the cause may not be known. What increases the risk? This condition is more likely to develop in women who:  Have ovaries that are enlarged because of ovarian cysts.  Take fertility medicine to become pregnant.  Are pregnant.  Have a history of ovarian torsion.  What are the signs or symptoms? The main symptom of this condition is pain in the lower abdomen, usually on one side of the body. The pain may be severe and may come and go suddenly. Other symptoms may include:  Abdominal pain that spreads to the sides, lower back, or thighs.  Nausea and vomiting.  Fever.  How is this diagnosed? This condition is diagnosed based on your medical history and a physical exam. Your health care provider may perform a pelvic exam, and he or she may feel your abdomen to check for an enlarged ovary. You may have tests, such as:  A pregnancy test.  An ultrasound to look at your ovary and the blood flow to your ovaries.  CT scan.  MRI.  Diagnostic laparoscopy. This is a procedure in which a thin tube with a light and camera on the end (laparoscope) is inserted through a small incision in your abdomen to look at your ovary.  How is this treated? This condition is treated with surgery to untwist the ovary (laparoscopic ovarian torsion surgery). If treated early, ovarian function can usually be restored. If the ovary cannot be untwisted, the ovary will have to be surgically removed (oophorectomy). The decision to remove your ovary will be based on your age, your desire to have children, and the condition of your ovary at the time of  surgery. This information is not intended to replace advice given to you by your health care provider. Make sure you discuss any questions you have with your health care provider. Document Released: 09/05/2011 Document Revised: 10/05/2015 Document Reviewed: 09/10/2015 Elsevier Interactive Patient Education  [employer].

## 2018-04-20 NOTE — Progress Notes (Signed)
Anticoagulation Management Michele Gillespie is a 42 y.o. female who reports to the clinic for monitoring of warfarin treatment.    Indication: Hypercoagulable state, primary; long term current use of anticoagulant.   Duration: indefinite Supervising physician: Gilles Chiquito  Anticoagulation Clinic Visit History: Patient does not report signs/symptoms of bleeding or thromboembolism  Other recent changes: No diet, medications, lifestyle changes except as noted in patient findings.  Anticoagulation Episode Summary    Current INR goal:   2.0-3.0  TTR:   49.5 % (5.9 y)  Next INR check:   04/27/2018  INR from last check:   5.3! (04/20/2018)  Weekly max warfarin dose:     Target end date:   Indefinite  INR check location:   Anticoagulation Clinic  Preferred lab:     Send INR reminders to:      Indications   HYPERCOAGULABLE STATE PRIMARY [D68.59] Long term current use of anticoagulant [Z79.01]       Comments:           No Known Allergies Prior to Admission medications   Medication Sig Start Date End Date Taking? Authorizing Provider  acetaminophen (TYLENOL) 325 MG tablet Take 650 mg by mouth every 6 (six) hours as needed for mild pain.   Yes [provider]  albuterol (PROVENTIL HFA) 108 (90 Base) MCG/ACT inhaler INHALE 2 PUFFS INTO THE LUNGS EVERY 6 HOURS AS NEEDED FOR WHEEZING. FOR SHORTNESS OF BREATH Patient taking differently: Inhale 2 puffs into the lungs every 6 (six) hours as needed for shortness of breath.  12/11/16  Yes Ophelia Shoulder, MD  amLODipine (NORVASC) 5 MG tablet Take 1 tablet (5 mg total) by mouth daily. 04/09/18 04/09/19 Yes Neva Seat, MD  ARIPiprazole (ABILIFY) 10 MG tablet Take 1 tablet (10 mg total) by mouth daily. 03/06/18  Yes Colbert Ewing, MD  cyclobenzaprine (FLEXERIL) 10 MG tablet Take 1 tablet (10 mg total) 2 (two) times daily as needed by mouth for muscle spasms. 08/07/17  Yes Domenic Moras, PA-C  diclofenac sodium (VOLTAREN) 1 % GEL Apply 2 g  topically 4 (four) times daily. 02/14/17  Yes Ophelia Shoulder, MD  HYDROcodone-acetaminophen (NORCO/VICODIN) 5-325 MG tablet Take 2 tablets by mouth every 4 (four) hours as needed. 01/25/17  Yes Esaw Grandchild, MD  hydrOXYzine (ATARAX/VISTARIL) 25 MG tablet Take 1 tablet (25 mg total) by mouth every 6 (six) hours. 04/03/18  Yes Jacqlyn Larsen, PA-C  methocarbamol (ROBAXIN) 750 MG tablet Take 1-2 tablets (750-1,500 mg total) by mouth 3 (three) times daily as needed for muscle spasms. 04/18/18  Yes Lorin Glass, PA-C  nicotine polacrilex (NICORETTE) 2 MG gum RX #3 Weeks 10-12: 1 piece every 4-8 hours. Max 24 pieces per day. 10/20/15  Yes Emokpae, Ejiroghene E, MD  Olopatadine HCl (PATADAY) 0.2 % SOLN Apply 1 drop to eye daily. 03/02/18  Yes Neva Seat, MD  omeprazole (PRILOSEC) 20 MG capsule Take 1 capsule (20 mg total) by mouth daily. 01/28/18  Yes Neva Seat, MD  ondansetron (ZOFRAN-ODT) 4 MG disintegrating tablet Take 1 tablet (4 mg total) by mouth every 8 (eight) hours as needed for nausea or vomiting. 12/16/16  Yes Norman Herrlich, MD  traMADol Veatrice Bourbon) 50 MG tablet Take 1 tablet (50 mg total) every 6 (six) hours as needed by mouth. 08/10/17  Yes Shary Decamp, PA-C  warfarin (COUMADIN) 5 MG tablet Take 3 tablets on Thursdays. All other days, take 2&1/2 tablets. 01/28/18  Yes Neva Seat, MD  methylPREDNISolone (MEDROL DOSEPAK) 4 MG TBPK  tablet Take as directed Patient not taking: Reported on 04/20/2018 04/03/18   Jacqlyn Larsen, PA-C  predniSONE (DELTASONE) 20 MG tablet 2 tabs po daily x 4 days Patient not taking: Reported on 04/20/2018 08/07/17   Domenic Moras, PA-C   Past Medical History:  Diagnosis Date  . Anemia 2007    microcytic anemia, baseline hemoglobin 10-11, MCV at baseline 72-77, secondary to iron deficiency  . Arm DVT (deep venous thromboembolism), acute [facility])  September 23, 2009, January 05, 2010    Doppler study significant with indeterminant age DVT involving the left upper  extremity, Doppler performed January 05, 2010 consistent with acute DVT involving the left upper extremity  . Asthma   . Depression   . Hypertension   . Leukopenia 2008    unclear etiology baseline WBC  2.8-3.7  . Lung nodule  June 10, 2008    stable tiny noduke noted along the minor fissure of the right lung on CT angio September 11, 09 -  stable for 2 years and consistent with benign disease  . Pulmonary embolism [facility])  September 07, 2005    her CT angiogram - positive for pulmonary emboli to several branches of the right lower lobe- relatively small clot burden, clear lung; patient started on Coumadin; CT angiogram on January 10, 2006 showed resolution of previously seen pulmonary emboli with minimal basilar atelectasis  . Schizophrenia [facility])    Social History   Socioeconomic History  . Marital status: Married    Spouse name: Not on file  . Number of children: Not on file  . Years of education: Not on file  . Highest education level: Not on file  Occupational History  . Not on file  Social Needs  . Financial resource strain: Not on file  . Food insecurity:    Worry: Not on file    Inability: Not on file  . Transportation needs:    Medical: Not on file    Non-medical: Not on file  Tobacco Use  . Smoking status: Former Smoker    Packs/day: 0.25    Years: 0.00    Pack years: 0.00    Types: Cigarettes  . Smokeless tobacco: Never Used  . Tobacco comment: Quit x 3 months.  Substance and Sexual Activity  . Alcohol use: Never    Alcohol/week: 0.0 oz    Frequency: Never    Comment: Quit x 4 yrs.  . Drug use: Not Currently    Types: Marijuana  . Sexual activity: Yes    Birth control/protection: None    Comment: tubal  Lifestyle  . Physical activity:    Days per week: Not on file    Minutes per session: Not on file  . Stress: Not on file  Relationships  . Social connections:    Talks on phone: Not on file    Gets together: Not on file    Attends religious service: Not  on file    Active member of club or organization: Not on file    Attends meetings of clubs or organizations: Not on file    Relationship status: Not on file  Other Topics Concern  . 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 Hypertension Mother   . Congestive Heart Failure Mother   .  Diabetes Father   . Birth defects Maternal Aunt   . Birth defects Maternal Uncle   . Diabetes Paternal Grandmother   . Breast cancer Sister   . Breast cancer Sister     ASSESSMENT Recent Results: The most recent result is correlated with 97.5 mg per week: Lab Results  Component Value Date   INR 5.3 (A) 04/20/2018   INR 2.80 04/18/2018   INR 3.0 03/30/2018    Anticoagulation Dosing: Description   OMIT dose scheduled for Tuesday April 21, 2018. On Wednesday, April 22, 2018, resume warfarin by taking only 2 and 1/2 tablets (of your 5mg  peach-colored warfarin tablets) by mouth, once-daily at [facility]. Return to clinic on Monday July 29 for repeat INR. Return to the Emergency Department for any symptoms or signs of bleeding.      INR today: Supratherapeutic  PLAN Weekly dose was decreased by 23% to75mg  per week--including tomorrow's omitted one-time dose.   Patient Instructions  Patient instructed to take medications as defined in the Anti-coagulation Track section of this encounter.  Patient instructed to OMIT tomorrow's dose (had already taken today's dose).   Patient instructed to OMIT dose scheduled for Tuesday April 21, 2018. On Wednesday, April 22, 2018, resume warfarin by taking only 2 and 1/2 tablets (of your 5mg  peach-colored warfarin tablets) by mouth, once-daily at [facility]. Return to clinic on Monday July 29 for repeat INR. Return to the Emergency Department for any symptoms or signs of bleeding.   Patient verbalized understanding of these instructions.      Patient advised to contact clinic or seek medical attention if signs/symptoms of bleeding or thromboembolism occur.  Patient verbalized understanding by repeating back information and was advised to contact me if further medication-related questions arise. Patient was also provided an information handout.  Follow-up Return in 1 week (on 04/27/2018) for Follow up INR at 0830h.  Pennie Banter, PharmD, CACP, CPP  15 minutes spent face-to-face with the patient during the encounter. 50% of time spent on education. 50% of time was spent on fingerstick point of care INR sample collection, processing, results determination, dose adjustment and discussion with the IM Physician seeing the patient today.

## 2018-04-20 NOTE — Patient Instructions (Signed)
Patient instructed to take medications as defined in the Anti-coagulation Track section of this encounter.  Patient instructed to OMIT tomorrow's dose (had already taken today's dose).   Patient instructed to OMIT dose scheduled for Tuesday April 21, 2018. On Wednesday, April 22, 2018, resume warfarin by taking only 2 and 1/2 tablets (of your 5mg  peach-colored warfarin tablets) by mouth, once-daily at [facility]. Return to clinic on Monday July 29 for repeat INR. Return to the Emergency Department for any symptoms or signs of bleeding.  Patient verbalized understanding of these instructions.

## 2018-04-20 NOTE — Telephone Encounter (Signed)
Pt called pharmacy is charging 40.00 due to physician isn't enrolled in [place], can someone call pharmacy pt has someone trying to pickup her medicine; pt contact# [phone]; [facility] on [address]

## 2018-04-21 ENCOUNTER — Encounter: Payer: Self-pay | Admitting: Internal Medicine

## 2018-04-21 DIAGNOSIS — M545 Low back pain, unspecified: Secondary | ICD-10-CM

## 2018-04-21 HISTORY — DX: Low back pain, unspecified: M54.50

## 2018-04-21 LAB — URINALYSIS, ROUTINE W REFLEX MICROSCOPIC
Bilirubin, UA: NEGATIVE
GLUCOSE, UA: NEGATIVE
KETONES UA: NEGATIVE
Leukocytes, UA: NEGATIVE
Nitrite, UA: NEGATIVE
Protein, UA: NEGATIVE
RBC, UA: NEGATIVE
SPEC GRAV UA: 1.025 (ref 1.005–1.030)
UUROB: 0.2 mg/dL (ref 0.2–1.0)
pH, UA: 6 (ref 5.0–7.5)

## 2018-04-21 LAB — CBC
HEMOGLOBIN: 13.6 g/dL (ref 11.1–15.9)
Hematocrit: 41.6 % (ref 34.0–46.6)
MCH: 25.8 pg — AB (ref 26.6–33.0)
MCHC: 32.7 g/dL (ref 31.5–35.7)
MCV: 79 fL (ref 79–97)
PLATELETS: 188 10*3/uL (ref 150–450)
RBC: 5.28 x10E6/uL (ref 3.77–5.28)
RDW: 14.7 % (ref 12.3–15.4)
WBC: 2.9 10*3/uL — AB (ref 3.4–10.8)

## 2018-04-21 NOTE — Assessment & Plan Note (Addendum)
-   Acute lower back pain with radiation toward left flank with unknown etiology - Pain refractory to robaxin and tramadol - ED visit on 7/20 with negative work up on CT abdomen and negative pregnancy test - Has history of right hip cystic lesion in prior imaging but current pain on opposite side - Only significant finding during ED visit was hematuria  - Acute lower back pain 2/2 muscle strain vs lumbar spine pathology vs nephrolithiasis vs ovarian pathology - Paraspinal tenderness with muscle tightening around lumbar region - No effect on range of motion - Could possibly be uric acid renal calculi as CT abdomen was negative for nephrolithiasis - Acute onset with severity also concerning for ovarian torsion or burst cyst but location of pain makes this unlikely  - Will give 3 day supply of Hydrocodone and see if pain resolves - Return to clinic within the week

## 2018-04-21 NOTE — Assessment & Plan Note (Addendum)
-   Patient follow with anticoagulation clinic on warfarin s/p dvt of left arm - Currently on 2.5~3mg  daily of Coumadin - Was found to have hematuria at recent ED visit (7/20) - Last Hgb 13.4, Platelets 192 - INR at today's visit at 5.3 - Vitals stable today w/o hypotension or tachycardia  - Patient's hematuria 2/2 Coumadin use vs Nephrolithiasis vs Nephritic syndrome - No frank blood in urine sample - No stones visualized on CT abdomen on 7/20 - Last Creatinine 1.02 close to baseline, GFR >60 - Probably from the increased INR on chronic Coumadin - Dr.Groce recommend patient skip her Coumain dose on 04/21/18 and then resume her regular dosing schedule - Will repeat UA and CBC today

## 2018-04-22 NOTE — Progress Notes (Signed)
Internal Medicine Clinic Attending  I saw and evaluated the patient.  I personally confirmed the key portions of the history and exam documented by Dr. Truman Hayward and I reviewed pertinent patient test results.  The assessment, diagnosis, and plan were formulated together and I agree with the documentation in the resident's note.  On our exam today, pain seems very localized to back.  She denies any abdominal pain at all.  She has some left flank pain.  I agree this could have been renal stones that may not show up on imaging vs. An MSK complaint.  She will have close follow up if pain does not start to resolve or improve this week.

## 2018-04-23 ENCOUNTER — Ambulatory Visit: Payer: Medicaid Other | Admitting: Neurology

## 2018-04-23 ENCOUNTER — Other Ambulatory Visit: Payer: Self-pay

## 2018-04-23 ENCOUNTER — Encounter: Payer: Self-pay | Admitting: Neurology

## 2018-04-23 ENCOUNTER — Telehealth: Payer: Self-pay | Admitting: Neurology

## 2018-04-23 VITALS — BP 118/70 | HR 85 | Ht 64.5 in | Wt 229.0 lb

## 2018-04-23 DIAGNOSIS — E538 Deficiency of other specified B group vitamins: Secondary | ICD-10-CM

## 2018-04-23 DIAGNOSIS — R413 Other amnesia: Secondary | ICD-10-CM

## 2018-04-23 NOTE — Telephone Encounter (Signed)
Medicaid order sent to GI. They will obtain the auth and will reach out to the pt to schedule.  °

## 2018-04-23 NOTE — Progress Notes (Signed)
Reason for visit: Memory disturbance  Referring physician: Dr. Raynald Blend is a 42 y.o. female  History of present illness:  Michele Gillespie is a 42 year old right-handed black female with a history of a head injury that occurred in 1994.  The patient claims that she began having some troubles with memory that began around 1998, and has gotten somewhat worse over the last 1 year.  The patient reports problems with short-term memory, remembering recent events.  The patient cannot remember the birthdays of her children.  She has some difficulty with directions with driving and may have difficulty keeping up with medications and appointments.  Her son helps her with her financial issues.  The patient is also had left occipital headaches since the head injury 1994, she will take Tylenol for this.  Occasionally the headaches may be bifrontal in nature.  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  She also has chronic low back pain, she takes Vicoprofen intermittently.  She has a history of cocaine abuse, but she indicates that she does not use cocaine or marijuana currently, she does not drink alcohol.  She does have some mild gait instability, she may fall on occasion.  She denies any numbness or weakness of the face, arms, legs.  She does report some insomnia problems and she takes Abilify for depression.  She comes to this office for an evaluation.  Past Medical History:  Diagnosis Date  . Anemia 2007    microcytic anemia, baseline hemoglobin 10-11, MCV at baseline 72-77, secondary to iron deficiency  . Arm DVT (deep venous thromboembolism), acute [facility])  September 23, 2009, January 05, 2010    Doppler study significant with indeterminant age DVT involving the left upper extremity, Doppler performed January 05, 2010 consistent with acute DVT involving the left upper extremity  . Asthma   . Depression   . Hypertension   . Leukopenia 2008    unclear etiology baseline  WBC  2.8-3.7  . Lung nodule  June 10, 2008    stable tiny noduke noted along the minor fissure of the right lung on CT angio September 11, 09 -  stable for 2 years and consistent with benign disease  . Pulmonary embolism [facility])  September 07, 2005    her CT angiogram - positive for pulmonary emboli to several branches of the right lower lobe- relatively small clot burden, clear lung; patient started on Coumadin; CT angiogram on January 10, 2006 showed resolution of previously seen pulmonary emboli with minimal basilar atelectasis  . Schizophrenia [facility])     Past Surgical History:  Procedure Laterality Date  . CESAREAN SECTION      History of 5 C-section  . EXPLORATORY LAPAROTOMY WITH ABDOMINAL MASS EXCISION  02/2005  . TUBAL LIGATION      Family History  Problem Relation Age of Onset  . Hypertension Mother   . Congestive Heart Failure Mother   . Diabetes Father   . Birth defects Maternal Aunt   . Birth defects Maternal Uncle   . Diabetes Paternal Grandmother   . Breast cancer Sister   . Breast cancer Sister     Social history:  reports that she has quit smoking. Her smoking use included cigarettes. She smoked 0.25 packs per day for 0.00 years. She has never used smokeless tobacco. She reports that she has current or past drug history. Drug: Marijuana. She reports that she does not drink alcohol.  Medications:  Prior to Admission medications   Medication Sig Start Date End Date Taking? Authorizing Provider  amLODipine (NORVASC) 5 MG tablet Take 1 tablet (5 mg total) by mouth daily. 04/09/18 04/09/19 Yes Neva Seat, MD  omeprazole (PRILOSEC) 20 MG capsule Take 1 capsule (20 mg total) by mouth daily. 01/28/18  Yes Neva Seat, MD  warfarin (COUMADIN) 5 MG tablet Take 3 tablets on Thursdays. All other days, take 2&1/2 tablets. Patient taking differently: Tuesday 2.5tabs, Wed 3tabs, Thurs 2.5tabs, fri 3tab, Sat and Sun 3tabs, Monday: 2.5 tabs 01/28/18  Yes Neva Seat, MD    acetaminophen (TYLENOL) 325 MG tablet Take 650 mg by mouth every 6 (six) hours as needed for mild pain.    [provider]  albuterol (PROVENTIL HFA) 108 (90 Base) MCG/ACT inhaler INHALE 2 PUFFS INTO THE LUNGS EVERY 6 HOURS AS NEEDED FOR WHEEZING. FOR SHORTNESS OF BREATH Patient not taking: Reported on 04/23/2018 12/11/16   Ophelia Shoulder, MD  ARIPiprazole (ABILIFY) 10 MG tablet Take 1 tablet (10 mg total) by mouth daily. Patient not taking: Reported on 04/23/2018 03/06/18   Colbert Ewing, MD  cyclobenzaprine (FLEXERIL) 10 MG tablet Take 1 tablet (10 mg total) 2 (two) times daily as needed by mouth for muscle spasms. Patient not taking: Reported on 04/23/2018 08/07/17   Domenic Moras, PA-C  diclofenac sodium (VOLTAREN) 1 % GEL Apply 2 g topically 4 (four) times daily. Patient not taking: Reported on 04/23/2018 02/14/17   Ophelia Shoulder, MD  HYDROcodone-acetaminophen (NORCO/VICODIN) 5-325 MG tablet Take 2 tablets by mouth every 4 (four) hours as needed. Patient not taking: Reported on 04/23/2018 01/25/17   Esaw Grandchild, MD  hydrocodone-ibuprofen (VICOPROFEN) 5-200 MG tablet Take 1 tablet by mouth every 8 (eight) hours as needed for up to 3 days for pain. Patient not taking: Reported on 04/23/2018 04/20/18 04/23/18  Alphonzo Grieve, MD  hydrOXYzine (ATARAX/VISTARIL) 25 MG tablet Take 1 tablet (25 mg total) by mouth every 6 (six) hours. Patient not taking: Reported on 04/23/2018 04/03/18   Jacqlyn Larsen, PA-C  methocarbamol (ROBAXIN) 750 MG tablet Take 1-2 tablets (750-1,500 mg total) by mouth 3 (three) times daily as needed for muscle spasms. Patient not taking: Reported on 04/23/2018 04/18/18   Lorin Glass, PA-C  methylPREDNISolone (MEDROL DOSEPAK) 4 MG TBPK tablet Take as directed Patient not taking: Reported on 04/20/2018 04/03/18   Jacqlyn Larsen, PA-C  nicotine polacrilex (NICORETTE) 2 MG gum RX #3 Weeks 10-12: 1 piece every 4-8 hours. Max 24 pieces per day. Patient not taking: Reported on  04/23/2018 10/20/15   Bethena Roys, MD  Olopatadine HCl (PATADAY) 0.2 % SOLN Apply 1 drop to eye daily. Patient not taking: Reported on 04/23/2018 03/02/18   Neva Seat, MD  ondansetron (ZOFRAN-ODT) 4 MG disintegrating tablet Take 1 tablet (4 mg total) by mouth every 8 (eight) hours as needed for nausea or vomiting. Patient not taking: Reported on 04/23/2018 12/16/16   Norman Herrlich, MD  predniSONE (DELTASONE) 20 MG tablet 2 tabs po daily x 4 days Patient not taking: Reported on 04/20/2018 08/07/17   Domenic Moras, PA-C  traMADol (ULTRAM) 50 MG tablet Take 1 tablet (50 mg total) every 6 (six) hours as needed by mouth. Patient not taking: Reported on 04/23/2018 08/10/17   Shary Decamp, PA-C     No Known Allergies  ROS:  Out of a complete 14 system review of symptoms, the patient complains only of the following symptoms, and all other reviewed systems are negative.  Chest  pain Shortness of breath, snoring Constipation Easy bruising Feeling hot, increased thirst Joint pain, aching muscles Skin sensitivity Memory loss, headache Depression, anxiety, decreased energy, disinterest in activities Restless legs  Blood pressure 118/70, pulse 85, height 5' 4.5" (1.638 m), weight 229 lb (103.9 kg), SpO2 98 %.  Physical Exam  General: The patient is alert and cooperative at the time of the examination.  The patient is moderately obese.  Eyes: Pupils are equal, round, and reactive to light. Discs are flat bilaterally.  Neck: The neck is supple, no carotid bruits are noted.  Respiratory: The respiratory examination is clear.  Cardiovascular: The cardiovascular examination reveals a regular rate and rhythm, no obvious murmurs or rubs are noted.   Neuromuscular: Range move the cervical spine is full.  Skin: Extremities are without significant edema.  Neurologic Exam  Mental status: The patient is alert and oriented x 3 at the time of the examination. The patient has apparent normal  recent and remote memory, with an apparently normal attention span and concentration ability.  Mini-Mental status examination done today shows a total score 30/30.  Cranial nerves: Facial symmetry is present. There is good sensation of the face to pinprick and soft touch bilaterally. The strength of the facial muscles and the muscles to head turning and shoulder shrug are normal bilaterally. Speech is well enunciated, no aphasia or dysarthria is noted. Extraocular movements are full. Visual fields are full. The tongue is midline, and the patient has symmetric elevation of the soft palate. No obvious hearing deficits are noted.  Motor: The motor testing reveals 5 over 5 strength of all 4 extremities. Good symmetric motor tone is noted throughout.  Sensory: Sensory testing is intact to pinprick, soft touch, vibration sensation, and position sense on all 4 extremities. No evidence of extinction is noted.  Coordination: Cerebellar testing reveals good finger-nose-finger and heel-to-shin bilaterally.  Gait and station: Gait is normal. Tandem gait is normal. Romberg is negative. No drift is seen.  Reflexes: Deep tendon reflexes are symmetric and normal bilaterally. Toes are downgoing bilaterally.   Assessment/Plan:  1.  Reported memory disturbance  2.  Chronic daily headache, left occipital  3.  History of head trauma  The patient is reporting increasing problems with memory of the last 1 year.  She will be set up for MRI of the brain, blood work will be done today.  Her clinical examination appears to be otherwise relatively unremarkable.  She does report daily headaches.  She will follow-up in 4 or 5 months.  Jill Alexanders MD 04/23/2018 11:57 AM  [facility] [address] [place], [place] [address]  Phone [phone] Fax [phone]

## 2018-04-23 NOTE — Patient Instructions (Signed)
We will check MRI of the brain and get blood work today.

## 2018-04-24 ENCOUNTER — Telehealth: Payer: Self-pay | Admitting: Neurology

## 2018-04-24 ENCOUNTER — Telehealth: Payer: Self-pay | Admitting: Internal Medicine

## 2018-04-24 LAB — ANA W/REFLEX: Anti Nuclear Antibody(ANA): POSITIVE — AB

## 2018-04-24 LAB — ENA+DNA/DS+SJORGEN'S
ENA SM AB SER-ACNC: 0.3 AI (ref 0.0–0.9)
ENA SSB (LA) Ab: <0.2 AI (ref 0.0–0.9)
dsDNA Ab: 1 IU/mL (ref 0–9)

## 2018-04-24 LAB — HIV ANTIBODY (ROUTINE TESTING W REFLEX): HIV Screen 4th Generation wRfx: NONREACTIVE

## 2018-04-24 LAB — RPR: RPR Ser Ql: NONREACTIVE

## 2018-04-24 LAB — VITAMIN B12: VITAMIN B 12: 547 pg/mL (ref 232–1245)

## 2018-04-24 LAB — AMMONIA: AMMONIA: 78 ug/dL (ref 19–87)

## 2018-04-24 LAB — SEDIMENTATION RATE: Sed Rate: 17 mm/hr (ref 0–32)

## 2018-04-24 NOTE — Telephone Encounter (Signed)
Pt calling about lab results; pt contact # [phone]

## 2018-04-24 NOTE — Telephone Encounter (Signed)
I called the patient.  The blood work was unremarkable except that the ANA was positive, there was a significant elevation of the RNP antibody in isolation.  Medical significance of this is not clear.  The patient will have MRI of the brain, I will call her when I get the results.  We will follow the memory issues over time.

## 2018-04-27 ENCOUNTER — Ambulatory Visit: Payer: Medicaid Other

## 2018-04-28 NOTE — Telephone Encounter (Signed)
Called patient's home and mobile number both numbers did not answer. Left voicemail at mobile number to call back

## 2018-04-29 NOTE — Telephone Encounter (Signed)
Spoke with patient on work phone number and she states her pain has not been resolved. She states she picked up her medication and even with hydrocodone treatment, the pain was severe. She would like to come back in for re-evaluation. Told her that can be arranged.

## 2018-04-30 ENCOUNTER — Encounter: Payer: Self-pay | Admitting: Internal Medicine

## 2018-04-30 ENCOUNTER — Ambulatory Visit: Payer: Medicaid Other

## 2018-05-04 ENCOUNTER — Other Ambulatory Visit: Payer: Self-pay | Admitting: Internal Medicine

## 2018-05-04 DIAGNOSIS — D6859 Other primary thrombophilia: Secondary | ICD-10-CM

## 2018-05-04 DIAGNOSIS — Z7901 Long term (current) use of anticoagulants: Secondary | ICD-10-CM

## 2018-05-04 NOTE — Telephone Encounter (Signed)
Er dr Trilby Drummer pt needs appt w/ dr groce asap

## 2018-05-04 NOTE — Telephone Encounter (Signed)
Warfarin refilled.  Paitent needs to be scheduled for follow up Anti-Coagulation visit. Missed appointment on 7/29 for this and INR was elevated at last visit.   Thank you

## 2018-05-04 NOTE — Telephone Encounter (Signed)
Dosing changed from "3 tablets on Thursdays. All other days, take 2&1/2 tablets" to "Take 2&1/2 tablets daily" based on recent anticoagulation visit note recommendation (where patient was found to have elevated INR.) Patient to be contacted to arrange follow up anti-coag visit as she missed visit on 04/27/18.  Pearson Grippe, DO IM PGY-2

## 2018-05-05 NOTE — Telephone Encounter (Signed)
Pt called / informed of Coumadin appt on Monday the 12th @ 1000 AM.

## 2018-05-08 ENCOUNTER — Ambulatory Visit
Admission: RE | Admit: 2018-05-08 | Discharge: 2018-05-08 | Disposition: A | Discharge status: Home / Self Care | Payer: Medicaid Other | Source: Ambulatory Visit | Attending: Neurology | Admitting: Neurology

## 2018-05-08 ENCOUNTER — Telehealth: Payer: Self-pay | Admitting: Neurology

## 2018-05-08 DIAGNOSIS — R413 Other amnesia: Secondary | ICD-10-CM

## 2018-05-08 NOTE — Telephone Encounter (Signed)
I called the patient.  MRI of the brain is essentially normal, blood work was unremarkable except that the ANA was positive, single antibody on the panel was elevated, the ENA RNP antibody.  Not sure the clinical significance of this.  The patient reports ongoing troubles with memory, I will try to get formal neuropsychological testing set up.   MRI brain 05/08/18:  IMPRESSION: This MRI of the brain without contrast shows the following: 1.    Few scattered punctate T2/FLAIR hypertense foci in the subcortical white matter of the hemispheres.  This likely represents minimal chronic microvascular ischemic change.  None of these appear to be acute. 2.    Brain volume is normal 3.    There are no acute findings.

## 2018-05-11 ENCOUNTER — Ambulatory Visit: Payer: Medicaid Other

## 2018-05-19 ENCOUNTER — Telehealth: Payer: Self-pay | Admitting: Internal Medicine

## 2018-05-19 NOTE — Telephone Encounter (Signed)
Called patient and informed her of her lab results of her ANA being mildly positive and Mi-2, Anti-Jo, and CK being normal. She was informed that the positive ANA is nonspecific and can be elevated for many reasons. She states that she has followed up with the dermatologist who did a biopsy for her rash which was negative (and did not schedule further follow up).  Patient expressed frustration about not being clear as to why she had blood clots in the past and has been on blood thinners for so long as well as why her ANA is positive (she had this test repeated recently by neurology and she states she was told that this can be positive in Lupus) she was informed that this can be positive but again it is nonspecific. She was told we would be happy to further discuss her anticoagulation at her next visit.  Patient stated she has an appointment scheduled with [facility] tomorrow because she wants a second opinion on her anticoagulation abd ANA. She was told that it is her right to seek a second opinion and that she certainly can go to her appointment tomorrow if she wishes, but in the long term it is best for her to have just one primary care physician and we hope that she will continue to come to the [facility], DO IM PGY-2

## 2018-05-27 ENCOUNTER — Encounter: Payer: Self-pay | Admitting: Hematology

## 2018-05-27 ENCOUNTER — Telehealth: Payer: Self-pay | Admitting: Hematology

## 2018-05-27 NOTE — Telephone Encounter (Signed)
New referral received for hematology. Pt has been scheduled to see Dr. Irene Limbo on 9/17 at 10am. Pt aware to arrive 30 minutes early. Letter mailed.

## 2018-06-05 ENCOUNTER — Other Ambulatory Visit: Payer: Self-pay | Admitting: Internal Medicine

## 2018-06-05 DIAGNOSIS — Z1231 Encounter for screening mammogram for malignant neoplasm of breast: Secondary | ICD-10-CM

## 2018-06-15 NOTE — Progress Notes (Addendum)
HEMATOLOGY/ONCOLOGY CONSULTATION NOTE  Date of Service: 06/16/2018  Patient Care Team: Neva Seat, MD as PCP - General Donne Hazel Levester Fresh, MD as Resident (Internal Medicine)  CHIEF COMPLAINTS/PURPOSE OF CONSULTATION:  Thrombophilia  HISTORY OF PRESENTING ILLNESS:   Michele Gillespie is a wonderful 42 y.o. female who has been referred to [place] by Dr. 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 She is accompanied today by her friend. The pt reports that she is doing well overall.   The pt notes that her first blood clot was in 2007 in her right calf which was not in the setting of a provoking event. She was treated with Lovenox and then Warfarin which she continued on. She notes that she also had a PE in 2007. She notes that she developed another blood clot in her left arm. Then another blood clot in her right arm in 2009. She notes that over this time she has had her Coumadin dose adjusted as her levels have fluctuated routinely. Her last clot was in her left neck and armpit in May of 2018, after not taking Coumadin for two days  Two hernia removals, endometriosis surgery, and 5 cesarean sections. Her last surgery was in 2005. She denies having any blood clots around the times of surgery or delivery.   The pt reports that her last two blood clots occurred in a two day period of not taking 12.5mg  Warfarin.    The pt notes that she was told she had type III Lupus in 2007 but has not seen rheumatology but will see a rheumatologist in December. She takes Omeprazole for acid reflux.   Most recent lab results (05/20/18) of CBC is as follows: all values are WNL except for WBC at 2.9k, RBC at 5.31, RDW at 14.9, MCV at 77, MCH at 25.2.  On review of systems, pt reports stable joint pains, frequent constipation, weight gain, stable energy levels, and denies abdominal pains, leg swelling, and any other symptoms.   On PMHx the pt reports anemia, LUE DVT in axillary and brachial  veins, upper extremity DVT on 09/23/09, left upper extremity DVT on 01/05/10, PE on 01/10/06, asthma, depression, schizophrenia, Lupus diagnosed in 2007, GERD. On Social Hx the pt reports that she is currently smoking 6-7 cigarettes each day and has smoked for several years. She denies any ETOH consumption and denies any other drug use. On Family Hx the pt reports Grandmother with blood clot.   MEDICAL HISTORY:  Past Medical History:  Diagnosis Date  . Anemia 2007    microcytic anemia, baseline hemoglobin 10-11, MCV at baseline 72-77, secondary to iron deficiency  . Arm DVT (deep venous thromboembolism), acute [facility])  September 23, 2009, January 05, 2010    Doppler study significant with indeterminant age DVT involving the left upper extremity, Doppler performed January 05, 2010 consistent with acute DVT involving the left upper extremity  . Asthma   . Depression   . Hypertension   . Leukopenia 2008    unclear etiology baseline WBC  2.8-3.7  . Lung nodule  June 10, 2008    stable tiny noduke noted along the minor fissure of the right lung on CT angio September 11, 09 -  stable for 2 years and consistent with benign disease  . Pulmonary embolism [facility])  September 07, 2005    her CT angiogram - positive for pulmonary emboli to several branches of the right lower lobe- relatively small clot burden, clear lung; patient started on  Coumadin; CT angiogram on January 10, 2006 showed resolution of previously seen pulmonary emboli with minimal basilar atelectasis  . Schizophrenia ([place])     SURGICAL HISTORY: Past Surgical History:  Procedure Laterality Date  . CESAREAN SECTION      History of 5 C-section  . EXPLORATORY LAPAROTOMY WITH ABDOMINAL MASS EXCISION  02/2005  . TUBAL LIGATION      SOCIAL HISTORY: Social History   Socioeconomic History  . Marital status: Married    Spouse name: Not on file  . Number of children: 5  . Years of education: In school  . Highest education level: Not on file    Occupational History  . Occupation: Disabled  Social Needs  . Financial resource strain: Not on file  . Food insecurity:    Worry: Not on file    Inability: Not on file  . Transportation needs:    Medical: Not on file    Non-medical: Not on file  Tobacco Use  . Smoking status: Former Smoker    Packs/day: 0.25    Years: 30.00    Pack years: 7.50    Types: Cigarettes  . Smokeless tobacco: Never Used  . Tobacco comment: Quit x 3 months. Restarted 3 weeks ago.   Substance and Sexual Activity  . Alcohol use: Never    Alcohol/week: 0.0 standard drinks    Frequency: Never    Comment: Quit x 4 yrs.  . Drug use: Not Currently    Types: Marijuana  . Sexual activity: Yes    Birth control/protection: None    Comment: tubal  Lifestyle  . Physical activity:    Days per week: Not on file    Minutes per session: Not on file  . Stress: Not on file  Relationships  . Social connections:    Talks on phone: Not on file    Gets together: Not on file    Attends religious service: Not on file    Active member of club or organization: Not on file    Attends meetings of clubs or organizations: Not on file    Relationship status: Not on file  . Intimate partner violence:    Fear of current or ex partner: Not on file    Emotionally abused: Not on file    Physically abused: Not on file    Forced sexual activity: Not on file  Other Topics Concern  . 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 Hypertension Mother   . Congestive Heart Failure Mother   . Diabetes Father   . Birth defects Maternal Aunt   . Birth defects Maternal Uncle   . Diabetes Paternal Grandmother   . Breast cancer Sister   . Breast cancer Sister      ALLERGIES:  has No Known Allergies.  MEDICATIONS:  Current Outpatient Medications  Medication Sig Dispense Refill  . acetaminophen (TYLENOL) 325 MG tablet Take 650 mg by mouth every 6 (six) hours as needed for mild pain.    Marland Kitchen albuterol (PROVENTIL HFA) 108 (90 Base) MCG/ACT inhaler INHALE 2 PUFFS INTO THE LUNGS EVERY 6 HOURS AS NEEDED FOR WHEEZING. FOR SHORTNESS OF BREATH 6.7 Inhaler  0  . amLODipine (NORVASC) 5 MG tablet Take 1 tablet (5 mg total) by mouth daily. 30 tablet 11  . ARIPiprazole (ABILIFY) 10 MG tablet Take 1 tablet (10 mg total) by mouth daily. 30 tablet 0  . Olopatadine HCl (PATADAY) 0.2 % SOLN Apply 1 drop to eye daily. 1 Bottle 0  . omeprazole (PRILOSEC) 20 MG capsule Take 1 capsule (20 mg total) by mouth daily. 30 capsule 3  . warfarin (COUMADIN) 5 MG tablet Take 2&1/2 tablets daily 72 tablet 2  . cyclobenzaprine (FLEXERIL) 10 MG tablet Take 1 tablet (10 mg total) 2 (two) times daily as needed by mouth for muscle spasms. (Patient not taking: Reported on 04/23/2018) 20 tablet 0  . diclofenac sodium (VOLTAREN) 1 % GEL Apply 2 g topically 4 (four) times daily. (Patient not taking: Reported on 04/23/2018) 1 Tube 0  . HYDROcodone-acetaminophen (NORCO/VICODIN) 5-325 MG tablet Take 2 tablets by mouth every 4 (four) hours as needed. (Patient not taking: Reported on 04/23/2018) 15 tablet 0  . hydrOXYzine (ATARAX/VISTARIL) 25 MG tablet Take 1 tablet (25 mg total) by mouth every 6 (six) hours. (Patient not taking: Reported on 04/23/2018) 12 tablet 0  . methocarbamol (ROBAXIN) 750 MG tablet Take 1-2 tablets (750-1,500 mg total) by mouth 3 (three) times daily as needed for muscle spasms. (Patient not taking: Reported on 04/23/2018) 18 tablet 0  . methylPREDNISolone (MEDROL DOSEPAK) 4 MG TBPK tablet Take as directed (Patient not taking: Reported on 04/20/2018) 21 tablet 0  . nicotine polacrilex (NICORETTE) 2 MG gum RX #3 Weeks 10-12: 1 piece every 4-8 hours. Max 24 pieces per day. (Patient not  taking: Reported on 04/23/2018) 126 each 0  . ondansetron (ZOFRAN-ODT) 4 MG disintegrating tablet Take 1 tablet (4 mg total) by mouth every 8 (eight) hours as needed for nausea or vomiting. (Patient not taking: Reported on 04/23/2018) 20 tablet 0  . predniSONE (DELTASONE) 20 MG tablet 2 tabs po daily x 4 days (Patient not taking: Reported on 04/20/2018) 8 tablet 0  . traMADol (ULTRAM) 50 MG tablet Take 1 tablet (50 mg total) every 6 (six) hours as needed by mouth. (Patient not taking: Reported on 04/23/2018) 12 tablet 0   No current facility-administered medications for this visit.     REVIEW OF SYSTEMS:    10 Point review of Systems was done is negative except as noted above.  PHYSICAL EXAMINATION:  . Vitals:   06/16/18 0952  BP: (!) 131/97  Pulse: 66  Resp: 18  Temp: 98.4 F (36.9 C)  SpO2: 100%   Filed Weights   06/16/18 0952  Weight: 230 lb 9.6 oz (104.6 kg)   .Body mass index is 38.97 kg/m.  GENERAL:alert, in no acute distress and comfortable SKIN: no acute rashes, no significant lesions EYES: conjunctiva are pink and non-injected, sclera anicteric OROPHARYNX: MMM, no exudates, no oropharyngeal erythema or ulceration NECK: supple, no JVD LYMPH:  no palpable lymphadenopathy in the cervical, axillary or inguinal regions LUNGS: clear to auscultation b/l with normal respiratory effort HEART: regular rate & rhythm ABDOMEN:  normoactive bowel sounds , non tender, not distended. Extremity: no pedal edema PSYCH: alert & oriented x 3 with fluent speech NEURO: no focal motor/sensory deficits  LABORATORY DATA:  I have reviewed the data as listed  . CBC Latest Ref Rng & Units 06/16/2018 04/20/2018 04/18/2018  WBC 3.9 - 10.3 K/uL 2.6(L) 2.9(L) 3.2(L)  Hemoglobin 11.6 - 15.9 g/dL 13.6 13.6 13.4  Hematocrit 34.8 - 46.6 % 42.3 41.6 42.8  Platelets 145 - 400 K/uL 173 188 192    . CMP Latest Ref Rng & Units 06/16/2018 04/18/2018 12/18/2017  Glucose 70 - 99 mg/dL 86 95 81  BUN 6 -  20 mg/dL 13 17 17   Creatinine 0.44 - 1.00 mg/dL 0.86 1.02(H) 1.03(H)  Sodium 135 - 145 mmol/L 141 139 139  Potassium 3.5 - 5.1 mmol/L 4.1 4.6 3.7  Chloride 98 - 111 mmol/L 111 107 109  CO2 22 - 32 mmol/L 23 25 22   Calcium 8.9 - 10.3 mg/dL 9.6 9.1 9.1  Total Protein 6.5 - 8.1 g/dL 7.3 6.6 -  Total Bilirubin 0.3 - 1.2 mg/dL 0.4 0.9 -  Alkaline Phos 38 - 126 U/L 48 45 -  AST 15 - 41 U/L 23 39 -  ALT 0 - 44 U/L 35 38 -   Lupus anticoagulant panel      The value has a corrected status.      No reference range information available      Resulting Lab: [facility]      Comments:           (NOTE)           No lupus anticoagulant was detected  Component     Latest Ref Rng & Units 06/16/2018  Beta-2 Glycoprotein I Ab, IgG     0 - 20 GPI IgG units <9  Beta-2-Glycoprotein I IgM     0 - 32 GPI IgM units <9  Beta-2-Glycoprotein I IgA     0 - 25 GPI IgA units <9  Anticardiolipin Ab,IgG,Qn     0 - 14 GPL U/mL <9  Anticardiolipin Ab,IgM,Qn     0 - 12 MPL U/mL 12  Anticardiolipin Ab,IgA,Qn     0 - 11 APL U/mL <9  PTT Lupus Anticoagulant     0.0 - 51.9 sec 38.6  DRVVT     0.0 - 47.0 sec 65.2 (H)  Lupus Anticoag Interp      Comment:  Antithrombin Activity     75 - 120 % 96    RADIOGRAPHIC STUDIES: I have personally reviewed the radiological images as listed and agreed with the findings in the report. No results found.  ASSESSMENT & PLAN:   42 y.o. female with  1. Thrombophilia 2.Recurrent VTE PLAN -Discussed patient's most recent labs from 05/20/18, normal platelets -Discussed that the pt seems to be failing on Coumadin with frequent fluctuating levels and repeated blood clots -Recommended that the pt consider Xarelto or Eliquis and provided supplemental information -Discussed that Xarelto and Eliquis are less forgiving with missed doses, and discussed the potential side effects -Discussed that the pt is recommended to continue long-term blood thinners  -The  pt will consider these options further and will return to care with her PCP -Less likely that pt has an inherited clot disorder given her multiple surgeries and deliveries without clots, but more concerned for acquired clot disorder -Lupus as an independent risk factor, pt will establish care with a rheumatologist in December  -Discussed that the pt's smoking is also a risk factor for repeated blood clots and recommended that she stop smoking -Will order labs today, will follow up with pt with results  -Will check Antiphospholipid antibodies, gene mutation factor V leiden, antithrombin, and will not check Protein S or Protein C as these are expected to be low while on Coumadin  -Recommended that PCP consider psychiatric factors prior to beginning NOACs  -Will see this pt back as  needed  -care appropriate and symptomatic directed evlatui as    Labs today RTC with Dr Irene Limbo as needed    All of the patients questions were answered with apparent satisfaction. The patient knows to call the clinic with any problems, questions or concerns.  The total time spent in the appt was 45 minutes and more than 50% was on counseling and direct patient cares.    Sullivan Lone MD MS AAHIVMS [facility] Hematology/Oncology Physician [facility]  (Office):       [phone] (Work cell):  [phone] (Fax):           [phone]  06/16/2018 10:54 AM  I, Baldwin [place], am acting as a scribe for Dr. Irene Limbo  .I have reviewed the above documentation for accuracy and completeness, and I agree with the above. Brunetta Genera MD

## 2018-06-16 ENCOUNTER — Telehealth: Payer: Self-pay

## 2018-06-16 ENCOUNTER — Inpatient Hospital Stay: Payer: Medicaid Other | Attending: Hematology | Admitting: Hematology

## 2018-06-16 ENCOUNTER — Inpatient Hospital Stay: Payer: Medicaid Other

## 2018-06-16 ENCOUNTER — Encounter: Payer: Self-pay | Admitting: Hematology

## 2018-06-16 VITALS — BP 131/97 | HR 66 | Temp 98.4°F | Resp 18 | Ht 64.5 in | Wt 230.6 lb

## 2018-06-16 DIAGNOSIS — D6859 Other primary thrombophilia: Secondary | ICD-10-CM | POA: Diagnosis present

## 2018-06-16 DIAGNOSIS — Z7901 Long term (current) use of anticoagulants: Secondary | ICD-10-CM | POA: Diagnosis not present

## 2018-06-16 DIAGNOSIS — Z86718 Personal history of other venous thrombosis and embolism: Secondary | ICD-10-CM | POA: Diagnosis not present

## 2018-06-16 DIAGNOSIS — Z720 Tobacco use: Secondary | ICD-10-CM | POA: Diagnosis not present

## 2018-06-16 DIAGNOSIS — I8290 Acute embolism and thrombosis of unspecified vein: Secondary | ICD-10-CM

## 2018-06-16 DIAGNOSIS — Z86711 Personal history of pulmonary embolism: Secondary | ICD-10-CM | POA: Insufficient documentation

## 2018-06-16 LAB — CMP (CANCER CENTER ONLY)
ALT: 35 U/L (ref 0–44)
AST: 23 U/L (ref 15–41)
Albumin: 3.6 g/dL (ref 3.5–5.0)
Alkaline Phosphatase: 48 U/L (ref 38–126)
Anion gap: 7 (ref 5–15)
BUN: 13 mg/dL (ref 6–20)
CHLORIDE: 111 mmol/L (ref 98–111)
CO2: 23 mmol/L (ref 22–32)
CREATININE: 0.86 mg/dL (ref 0.44–1.00)
Calcium: 9.6 mg/dL (ref 8.9–10.3)
GFR, Estimated: >60 mL/min (ref >60)
Glucose, Bld: 86 mg/dL (ref 70–99)
POTASSIUM: 4.1 mmol/L (ref 3.5–5.1)
SODIUM: 141 mmol/L (ref 135–145)
Total Bilirubin: 0.4 mg/dL (ref 0.3–1.2)
Total Protein: 7.3 g/dL (ref 6.5–8.1)

## 2018-06-16 LAB — CBC WITH DIFFERENTIAL/PLATELET
Basophils Absolute: 0 10*3/uL (ref 0.0–0.1)
Basophils Relative: 0 %
EOS ABS: 0 10*3/uL (ref 0.0–0.5)
Eosinophils Relative: 2 %
HCT: 42.3 % (ref 34.8–46.6)
Hemoglobin: 13.6 g/dL (ref 11.6–15.9)
LYMPHS ABS: 1.5 10*3/uL (ref 0.9–3.3)
Lymphocytes Relative: 56 %
MCH: 25.3 pg (ref 25.1–34.0)
MCHC: 32.2 g/dL (ref 31.5–36.0)
MCV: 78.8 fL — ABNORMAL LOW (ref 79.5–101.0)
Monocytes Absolute: 0.3 10*3/uL (ref 0.1–0.9)
Monocytes Relative: 10 %
Neutro Abs: 0.8 10*3/uL — ABNORMAL LOW (ref 1.5–6.5)
Neutrophils Relative %: 32 %
Platelets: 173 10*3/uL (ref 145–400)
RBC: 5.37 MIL/uL (ref 3.70–5.45)
RDW: 14.5 % (ref 11.2–14.5)
WBC: 2.6 10*3/uL — ABNORMAL LOW (ref 3.9–10.3)

## 2018-06-16 LAB — ANTITHROMBIN III: ANTITHROMB III FUNC: 96 % (ref 75–120)

## 2018-06-16 NOTE — Telephone Encounter (Signed)
Printed avs and calender of upcoming appointment. Per 9/17 los 

## 2018-06-17 LAB — BETA-2-GLYCOPROTEIN I ABS, IGG/M/A
Beta-2 Glyco I IgG: <9 GPI IgG units (ref 0–20)
Beta-2-Glycoprotein I IgA: <9 GPI IgA units (ref 0–25)

## 2018-06-17 LAB — LUPUS ANTICOAGULANT PANEL
DRVVT: 65.2 s — ABNORMAL HIGH (ref 0.0–47.0)
PTT Lupus Anticoagulant: 38.6 s (ref 0.0–51.9)

## 2018-06-17 LAB — CARDIOLIPIN ANTIBODIES, IGG, IGM, IGA
Anticardiolipin IgA: <9 APL U/mL (ref 0–11)
Anticardiolipin IgM: 12 MPL U/mL (ref 0–12)

## 2018-06-17 LAB — DRVVT MIX: dRVVT Mix: 41 s (ref 0.0–47.0)

## 2018-06-22 LAB — FACTOR 5 LEIDEN

## 2018-06-22 LAB — PROTHROMBIN GENE MUTATION

## 2018-06-30 ENCOUNTER — Ambulatory Visit
Admission: RE | Admit: 2018-06-30 | Discharge: 2018-06-30 | Disposition: A | Discharge status: Home / Self Care | Payer: Medicaid Other | Source: Ambulatory Visit | Attending: *Deleted | Admitting: *Deleted

## 2018-06-30 ENCOUNTER — Ambulatory Visit (INDEPENDENT_AMBULATORY_CARE_PROVIDER_SITE_OTHER): Payer: Medicaid Other | Admitting: Pharmacist

## 2018-06-30 DIAGNOSIS — Z1231 Encounter for screening mammogram for malignant neoplasm of breast: Secondary | ICD-10-CM

## 2018-06-30 DIAGNOSIS — D6859 Other primary thrombophilia: Secondary | ICD-10-CM

## 2018-06-30 DIAGNOSIS — Z7901 Long term (current) use of anticoagulants: Secondary | ICD-10-CM | POA: Diagnosis not present

## 2018-06-30 DIAGNOSIS — Z5181 Encounter for therapeutic drug level monitoring: Secondary | ICD-10-CM | POA: Diagnosis not present

## 2018-06-30 LAB — POCT INR: INR: 2 (ref 2.0–3.0)

## 2018-06-30 NOTE — Patient Instructions (Signed)
Patient instructed to take medications as defined in the Anti-coagulation Track section of this encounter.  Patient instructed to take today's dose.  Patient instructed to take 2 and 1/2 of her 5mg  peach-colored warfarin tablets by mouth, once-daily at 6PM--EXCEPT on TUESDAYS and FRIDAYS, take three (3) tablets of your 5mg  peach-colored warfarin tablets on Tuesdays and Fridays.  Return to the Emergency Department for any symptoms or signs of bleeding.  Patient verbalized understanding of these instructions.

## 2018-06-30 NOTE — Progress Notes (Signed)
Anticoagulation Management Michele Gillespie is a 42 y.o. female who reports to the clinic for monitoring of warfarin treatment.    Indication: Primary hypercoagulable state; long term current use of anticoagulant.    Duration: indefinite Supervising physician: [facility] Visit History: Patient does not report signs/symptoms of bleeding or thromboembolism  Other recent changes: No diet, medications, lifestyle changes endorsed.  Anticoagulation Episode Summary    Current INR goal:   2.0-3.0  TTR:   48.9 % (6.1 y)  Next INR check:   04/27/2018  INR from last check:   2.0 (06/30/2018)  Weekly max warfarin dose:     Target end date:   Indefinite  INR check location:   Anticoagulation Clinic  Preferred lab:     Send INR reminders to:      Indications   HYPERCOAGULABLE STATE PRIMARY [D68.59] Long term current use of anticoagulant [Z79.01]       Comments:           No Known Allergies Prior to Admission medications   Medication Sig Start Date End Date Taking? Authorizing Provider  acetaminophen (TYLENOL) 325 MG tablet Take 650 mg by mouth every 6 (six) hours as needed for mild pain.   Yes [provider]  albuterol (PROVENTIL HFA) 108 (90 Base) MCG/ACT inhaler INHALE 2 PUFFS INTO THE LUNGS EVERY 6 HOURS AS NEEDED FOR WHEEZING. FOR SHORTNESS OF BREATH 12/11/16  Yes Ophelia Shoulder, MD  amLODipine (NORVASC) 5 MG tablet Take 1 tablet (5 mg total) by mouth daily. 04/09/18 04/09/19 Yes Neva Seat, MD  ARIPiprazole (ABILIFY) 10 MG tablet Take 1 tablet (10 mg total) by mouth daily. 03/06/18  Yes Colbert Ewing, MD  cyclobenzaprine (FLEXERIL) 10 MG tablet Take 1 tablet (10 mg total) 2 (two) times daily as needed by mouth for muscle spasms. 08/07/17  Yes Domenic Moras, PA-C  diclofenac sodium (VOLTAREN) 1 % GEL Apply 2 g topically 4 (four) times daily. 02/14/17  Yes Ophelia Shoulder, MD  HYDROcodone-acetaminophen (NORCO/VICODIN) 5-325 MG tablet Take 2 tablets by mouth  every 4 (four) hours as needed. 01/25/17  Yes Esaw Grandchild, MD  hydrOXYzine (ATARAX/VISTARIL) 25 MG tablet Take 1 tablet (25 mg total) by mouth every 6 (six) hours. 04/03/18  Yes Jacqlyn Larsen, PA-C  methocarbamol (ROBAXIN) 750 MG tablet Take 1-2 tablets (750-1,500 mg total) by mouth 3 (three) times daily as needed for muscle spasms. 04/18/18  Yes Lorin Glass, PA-C  methylPREDNISolone (MEDROL DOSEPAK) 4 MG TBPK tablet Take as directed 04/03/18  Yes Jacqlyn Larsen, PA-C  nicotine polacrilex (NICORETTE) 2 MG gum RX #3 Weeks 10-12: 1 piece every 4-8 hours. Max 24 pieces per day. 10/20/15  Yes Emokpae, Ejiroghene E, MD  Olopatadine HCl (PATADAY) 0.2 % SOLN Apply 1 drop to eye daily. 03/02/18  Yes Neva Seat, MD  omeprazole (PRILOSEC) 20 MG capsule Take 1 capsule (20 mg total) by mouth daily. 01/28/18  Yes Neva Seat, MD  ondansetron (ZOFRAN-ODT) 4 MG disintegrating tablet Take 1 tablet (4 mg total) by mouth every 8 (eight) hours as needed for nausea or vomiting. 12/16/16  Yes Norman Herrlich, MD  predniSONE (DELTASONE) 20 MG tablet 2 tabs po daily x 4 days 08/07/17  Yes Domenic Moras, PA-C  traMADol (ULTRAM) 50 MG tablet Take 1 tablet (50 mg total) every 6 (six) hours as needed by mouth. 08/10/17  Yes Shary Decamp, PA-C  warfarin (COUMADIN) 5 MG tablet Take 2&1/2 tablets daily 05/04/18  Yes Neva Seat, MD  Past Medical History:  Diagnosis Date  . Anemia 2007    microcytic anemia, baseline hemoglobin 10-11, MCV at baseline 72-77, secondary to iron deficiency  . Arm DVT (deep venous thromboembolism), acute [facility])  September 23, 2009, January 05, 2010    Doppler study significant with indeterminant age DVT involving the left upper extremity, Doppler performed January 05, 2010 consistent with acute DVT involving the left upper extremity  . Asthma   . Depression   . Hypertension   . Leukopenia 2008    unclear etiology baseline WBC  2.8-3.7  . Lung nodule  June 10, 2008    stable tiny  noduke noted along the minor fissure of the right lung on CT angio September 11, 09 -  stable for 2 years and consistent with benign disease  . Pulmonary embolism [facility])  September 07, 2005    her CT angiogram - positive for pulmonary emboli to several branches of the right lower lobe- relatively small clot burden, clear lung; patient started on Coumadin; CT angiogram on January 10, 2006 showed resolution of previously seen pulmonary emboli with minimal basilar atelectasis  . Schizophrenia [facility])    Social History   Socioeconomic History  . Marital status: Married    Spouse name: Not on file  . Number of children: 5  . Years of education: In school  . Highest education level: Not on file  Occupational History  . Occupation: Disabled  Social Needs  . Financial resource strain: Not on file  . Food insecurity:    Worry: Not on file    Inability: Not on file  . Transportation needs:    Medical: Not on file    Non-medical: Not on file  Tobacco Use  . Smoking status: Former Smoker    Packs/day: 0.25    Years: 30.00    Pack years: 7.50    Types: Cigarettes  . Smokeless tobacco: Never Used  . Tobacco comment: Quit x 3 months. Restarted 3 weeks ago.   Substance and Sexual Activity  . Alcohol use: Never    Alcohol/week: 0.0 standard drinks    Frequency: Never    Comment: Quit x 4 yrs.  . Drug use: Not Currently    Types: Marijuana  . Sexual activity: Yes    Birth control/protection: None    Comment: tubal  Lifestyle  . Physical activity:    Days per week: Not on file    Minutes per session: Not on file  . Stress: Not on file  Relationships  . Social connections:    Talks on phone: Not on file    Gets together: Not on file    Attends religious service: Not on file    Active member of club or organization: Not on file    Attends meetings of clubs or organizations: Not on file    Relationship status: Not on file  Other Topics Concern  . 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 Hypertension Mother   . Congestive Heart Failure Mother   . Diabetes Father   . Birth defects Maternal Aunt   . Birth defects Maternal Uncle   . Diabetes Paternal Grandmother   .  Breast cancer Sister   . Breast cancer Sister     ASSESSMENT Recent Results: The most recent result is correlated with 87.5 mg per week: Lab Results  Component Value Date   INR 2.0 06/30/2018   INR 5.3 (A) 04/20/2018   INR 2.80 04/18/2018    Anticoagulation Dosing: Description   Patient instructed to take 2 and 1/2 of her 5mg  peach-colored warfarin tablets by mouth, once-daily at 6PM--EXCEPT on TUESDAYS and FRIDAYS, take three (3) tablets of your 5mg  peach-colored warfarin tablets on Tuesdays and Fridays.  Return to the Emergency Department for any symptoms or signs of bleeding.      INR today: Therapeutic  PLAN Weekly dose was increased by  20% to 92.5 mg per week  Patient Instructions  Patient instructed to take medications as defined in the Anti-coagulation Track section of this encounter.  Patient instructed to take today's dose.  Patient instructed to take 2 and 1/2 of her 5mg  peach-colored warfarin tablets by mouth, once-daily at 6PM--EXCEPT on TUESDAYS and FRIDAYS, take three (3) tablets of your 5mg  peach-colored warfarin tablets on Tuesdays and Fridays.  Return to the Emergency Department for any symptoms or signs of bleeding.  Patient verbalized understanding of these instructions.     Patient advised to contact clinic or seek medical attention if signs/symptoms of bleeding or thromboembolism occur.  Patient verbalized understanding by repeating back information and was advised to contact me if further medication-related questions  arise. Patient was also provided an information handout.  Follow-up Return in about 27 days (around 07/27/2018) for Follow up INR at 1000h.  Pennie Banter, PharmD, CACP, CPP  15 minutes spent face-to-face with the patient during the encounter. 50% of time spent on education. 50% of time was spent on fingerstick point of care INR sample collection, processing, results determination, dose adjustment and documentation in http://www.kim.net/.

## 2018-07-01 NOTE — Progress Notes (Signed)
I reviewed Dr. Gladstone Pih note.  Patient is on [facility] for hypercoagulable state.  Her INR was 2 and her coumadin was increased to maintain her in the therapeutic range.

## 2018-07-02 ENCOUNTER — Encounter: Payer: Self-pay | Admitting: *Deleted

## 2018-07-24 ENCOUNTER — Other Ambulatory Visit: Payer: Self-pay | Admitting: Internal Medicine

## 2018-07-24 DIAGNOSIS — D6859 Other primary thrombophilia: Secondary | ICD-10-CM

## 2018-07-24 DIAGNOSIS — Z7901 Long term (current) use of anticoagulants: Secondary | ICD-10-CM

## 2018-07-27 ENCOUNTER — Ambulatory Visit: Payer: Medicaid Other

## 2018-07-31 ENCOUNTER — Ambulatory Visit: Payer: Medicaid Other | Admitting: Pharmacist

## 2018-07-31 ENCOUNTER — Encounter (INDEPENDENT_AMBULATORY_CARE_PROVIDER_SITE_OTHER): Payer: Self-pay

## 2018-07-31 DIAGNOSIS — Z5181 Encounter for therapeutic drug level monitoring: Secondary | ICD-10-CM | POA: Diagnosis not present

## 2018-07-31 DIAGNOSIS — D6859 Other primary thrombophilia: Secondary | ICD-10-CM | POA: Diagnosis not present

## 2018-07-31 DIAGNOSIS — Z7901 Long term (current) use of anticoagulants: Secondary | ICD-10-CM | POA: Diagnosis not present

## 2018-07-31 LAB — POCT INR: INR: 3.2 — AB (ref 2.0–3.0)

## 2018-07-31 NOTE — Patient Instructions (Signed)
Patient instructed to take medications as defined in the Anti-coagulation Track section of this encounter.  Patient instructed to take today's dose.  Patient instructed to take  2 and 1/2 of her 5mg  peach-colored warfarin tablets by mouth, once-daily at 6PM--EXCEPT on TUESDAYS  take three (3) tablets of your 5mg  peach-colored warfarin tablets on Tuesdays Patient verbalized understanding of these instructions.

## 2018-07-31 NOTE — Progress Notes (Signed)
Anticoagulation Management Michele Gillespie is a 42 y.o. female who reports to the clinic for monitoring of warfarin treatment.    Indication: Hypercoagulable state, history of VTE, long term current use of anticoagulant.    Duration: indefinite Supervising physician: Aldine Contes  Anticoagulation Clinic Visit History: Patient does not report signs/symptoms of bleeding or thromboembolism  Other recent changes: No diet, medications, lifestyle changes.  Anticoagulation Episode Summary    Current INR goal:   2.0-3.0  TTR:   49.4 % (6.2 y)  Next INR check:   08/31/2018  INR from last check:   3.2! (07/31/2018)  Weekly max warfarin dose:     Target end date:   Indefinite  INR check location:   Anticoagulation Clinic  Preferred lab:     Send INR reminders to:      Indications   HYPERCOAGULABLE STATE PRIMARY [D68.59] Long term current use of anticoagulant [Z79.01]       Comments:           No Known Allergies Prior to Admission medications   Medication Sig Start Date End Date Taking? Authorizing Provider  acetaminophen (TYLENOL) 325 MG tablet Take 650 mg by mouth every 6 (six) hours as needed for mild pain.   Yes [provider]  albuterol (PROVENTIL HFA) 108 (90 Base) MCG/ACT inhaler INHALE 2 PUFFS INTO THE LUNGS EVERY 6 HOURS AS NEEDED FOR WHEEZING. FOR SHORTNESS OF BREATH 12/11/16  Yes Ophelia Shoulder, MD  amLODipine (NORVASC) 5 MG tablet Take 1 tablet (5 mg total) by mouth daily. 04/09/18 04/09/19 Yes Neva Seat, MD  ARIPiprazole (ABILIFY) 10 MG tablet Take 1 tablet (10 mg total) by mouth daily. 03/06/18  Yes Colbert Ewing, MD  cyclobenzaprine (FLEXERIL) 10 MG tablet Take 1 tablet (10 mg total) 2 (two) times daily as needed by mouth for muscle spasms. 08/07/17  Yes Domenic Moras, PA-C  diclofenac sodium (VOLTAREN) 1 % GEL Apply 2 g topically 4 (four) times daily. 02/14/17  Yes Ophelia Shoulder, MD  HYDROcodone-acetaminophen (NORCO/VICODIN) 5-325 MG tablet Take 2 tablets by  mouth every 4 (four) hours as needed. 01/25/17  Yes Esaw Grandchild, MD  hydrOXYzine (ATARAX/VISTARIL) 25 MG tablet Take 1 tablet (25 mg total) by mouth every 6 (six) hours. 04/03/18  Yes Jacqlyn Larsen, PA-C  methocarbamol (ROBAXIN) 750 MG tablet Take 1-2 tablets (750-1,500 mg total) by mouth 3 (three) times daily as needed for muscle spasms. 04/18/18  Yes Lorin Glass, PA-C  methylPREDNISolone (MEDROL DOSEPAK) 4 MG TBPK tablet Take as directed 04/03/18  Yes Jacqlyn Larsen, PA-C  nicotine polacrilex (NICORETTE) 2 MG gum RX #3 Weeks 10-12: 1 piece every 4-8 hours. Max 24 pieces per day. 10/20/15  Yes Emokpae, Ejiroghene E, MD  Olopatadine HCl (PATADAY) 0.2 % SOLN Apply 1 drop to eye daily. 03/02/18  Yes Neva Seat, MD  omeprazole (PRILOSEC) 20 MG capsule Take 1 capsule (20 mg total) by mouth daily. 01/28/18  Yes Neva Seat, MD  ondansetron (ZOFRAN-ODT) 4 MG disintegrating tablet Take 1 tablet (4 mg total) by mouth every 8 (eight) hours as needed for nausea or vomiting. 12/16/16  Yes Norman Herrlich, MD  predniSONE (DELTASONE) 20 MG tablet 2 tabs po daily x 4 days 08/07/17  Yes Domenic Moras, PA-C  traMADol (ULTRAM) 50 MG tablet Take 1 tablet (50 mg total) every 6 (six) hours as needed by mouth. 08/10/17  Yes Shary Decamp, PA-C  warfarin (COUMADIN) 5 MG tablet Take 2&1/2 tablets daily, except on Tuesdays and Fridays--take 3  tablets on Tuesdays and Fridays. 07/24/18  Yes Pennie Banter, RPH-CPP   Past Medical History:  Diagnosis Date  . Anemia 2007    microcytic anemia, baseline hemoglobin 10-11, MCV at baseline 72-77, secondary to iron deficiency  . Arm DVT (deep venous thromboembolism), acute [facility])  September 23, 2009, January 05, 2010    Doppler study significant with indeterminant age DVT involving the left upper extremity, Doppler performed January 05, 2010 consistent with acute DVT involving the left upper extremity  . Asthma   . Depression   . Hypertension   . Leukopenia 2008    unclear  etiology baseline WBC  2.8-3.7  . Lung nodule  June 10, 2008    stable tiny noduke noted along the minor fissure of the right lung on CT angio September 11, 09 -  stable for 2 years and consistent with benign disease  . Pulmonary embolism [facility])  September 07, 2005    her CT angiogram - positive for pulmonary emboli to several branches of the right lower lobe- relatively small clot burden, clear lung; patient started on Coumadin; CT angiogram on January 10, 2006 showed resolution of previously seen pulmonary emboli with minimal basilar atelectasis  . Schizophrenia [facility])    Social History   Socioeconomic History  . Marital status: Married    Spouse name: Not on file  . Number of children: 5  . Years of education: In school  . Highest education level: Not on file  Occupational History  . Occupation: Disabled  Social Needs  . Financial resource strain: Not on file  . Food insecurity:    Worry: Not on file    Inability: Not on file  . Transportation needs:    Medical: Not on file    Non-medical: Not on file  Tobacco Use  . Smoking status: Former Smoker    Packs/day: 0.25    Years: 30.00    Pack years: 7.50    Types: Cigarettes  . Smokeless tobacco: Never Used  . Tobacco comment: Quit x 3 months. Restarted 3 weeks ago.   Substance and Sexual Activity  . Alcohol use: Never    Alcohol/week: 0.0 standard drinks    Frequency: Never    Comment: Quit x 4 yrs.  . Drug use: Not Currently    Types: Marijuana  . Sexual activity: Yes    Birth control/protection: None    Comment: tubal  Lifestyle  . Physical activity:    Days per week: Not on file    Minutes per session: Not on file  . Stress: Not on file  Relationships  . Social connections:    Talks on phone: Not on file    Gets together: Not on file    Attends religious service: Not on file    Active member of club or organization: Not on file    Attends meetings of clubs or organizations: Not on file    Relationship status:  Not on file  Other Topics Concern  . 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 Hypertension Mother   . Congestive Heart Failure Mother   . Diabetes Father   . Birth defects Maternal Aunt   .  Birth defects Maternal Uncle   . Diabetes Paternal Grandmother   . Breast cancer Sister   . Breast cancer Sister     ASSESSMENT Recent Results: The most recent result is correlated with 92.5 mg per week: Lab Results  Component Value Date   INR 3.2 (A) 07/31/2018   INR 2.0 06/30/2018   INR 5.3 (A) 04/20/2018    Anticoagulation Dosing: Description   Patient instructed to take 2 and 1/2 of her 5mg  peach-colored warfarin tablets by mouth, once-daily at 6PM--EXCEPT on TUESDAYS  take three (3) tablets of your 5mg  peach-colored warfarin tablets on Tuesdays.Return to the Emergency Department for any symptoms or signs of bleeding.      INR today: Supratherapeutic  PLAN Weekly dose was decreased by 3% to 90 mg per week  Patient Instructions  Patient instructed to take medications as defined in the Anti-coagulation Track section of this encounter.  Patient instructed to take today's dose.  Patient instructed to take  2 and 1/2 of her 5mg  peach-colored warfarin tablets by mouth, once-daily at 6PM--EXCEPT on TUESDAYS  take three (3) tablets of your 5mg  peach-colored warfarin tablets on Tuesdays Patient verbalized understanding of these instructions.     Patient advised to contact clinic or seek medical attention if signs/symptoms of bleeding or thromboembolism occur.  Patient verbalized understanding by repeating back information and was advised to contact me if further medication-related questions arise. Patient was also provided an  information handout.  Follow-up Return in 31 days (on 08/31/2018) for Follow up INR at 4:30PM.  Pennie Banter, PharmD, CACP, CPP  15 minutes spent face-to-face with the patient during the encounter. 50% of time spent on education. 50% of time was spent on fingerstick point of care INR sample collection, processing, results determination, dose adjustment, and documentation in http://[email or web address].net/.

## 2018-08-09 ENCOUNTER — Other Ambulatory Visit: Payer: Self-pay

## 2018-08-09 ENCOUNTER — Emergency Department (HOSPITAL_COMMUNITY)
Admission: EM | Admit: 2018-08-09 | Discharge: 2018-08-09 | Disposition: A | Discharge status: Home / Self Care | Payer: Medicaid Other | Attending: Emergency Medicine | Admitting: Emergency Medicine

## 2018-08-09 DIAGNOSIS — I10 Essential (primary) hypertension: Secondary | ICD-10-CM | POA: Insufficient documentation

## 2018-08-09 DIAGNOSIS — Z7901 Long term (current) use of anticoagulants: Secondary | ICD-10-CM | POA: Insufficient documentation

## 2018-08-09 DIAGNOSIS — J45909 Unspecified asthma, uncomplicated: Secondary | ICD-10-CM | POA: Diagnosis not present

## 2018-08-09 DIAGNOSIS — Z86711 Personal history of pulmonary embolism: Secondary | ICD-10-CM | POA: Diagnosis not present

## 2018-08-09 DIAGNOSIS — Z79899 Other long term (current) drug therapy: Secondary | ICD-10-CM | POA: Diagnosis not present

## 2018-08-09 DIAGNOSIS — F17210 Nicotine dependence, cigarettes, uncomplicated: Secondary | ICD-10-CM | POA: Insufficient documentation

## 2018-08-09 DIAGNOSIS — R202 Paresthesia of skin: Secondary | ICD-10-CM

## 2018-08-09 NOTE — ED Triage Notes (Signed)
Pt to ED for evaluation of bilateral hand and arm tingling x1 week. No neuro deficits noted. Denies pain.

## 2018-08-09 NOTE — ED Notes (Signed)
ED Provider at bedside. 

## 2018-08-09 NOTE — ED Provider Notes (Signed)
[facility] EMERGENCY DEPARTMENT Provider Note   CSN: [id number] Arrival date & time: 08/09/18  8841     History   Chief Complaint Chief Complaint  Patient presents with  . Hand Pain    both hands and forearms  . Numbness    both hands    HPI Michele Gillespie is a 42 y.o. female.  Patient is a 42 year old female who presents with tingling in both of her hands.  She describes about a 2-week history of some intermittent tingling in the fingers of both of her hands.  She states there is mostly in the fingertips.  Its in all 5 fingers of both hands.  She states it is worse when she is using her hands and at night, it wakes her up.  She denies any weakness in the hands.  Occasionally she has some discomfort in her wrist.  She works as a Advice worker as well as Secretary/administrator.  She does say that the symptoms are worse when she is using her hands.  She denies any discoloration to her fingers.  She denies any swelling of her hands.  She is had a prior upper arm DVT and is on Coumadin.  No chest pain or shortness of breath.  No pain in her neck.  No radicular pain.     Past Medical History:  Diagnosis Date  . Anemia 2007    microcytic anemia, baseline hemoglobin 10-11, MCV at baseline 72-77, secondary to iron deficiency  . Arm DVT (deep venous thromboembolism), acute [facility])  September 23, 2009, January 05, 2010    Doppler study significant with indeterminant age DVT involving the left upper extremity, Doppler performed January 05, 2010 consistent with acute DVT involving the left upper extremity  . Asthma   . Depression   . Hypertension   . Leukopenia 2008    unclear etiology baseline WBC  2.8-3.7  . Lung nodule  June 10, 2008    stable tiny noduke noted along the minor fissure of the right lung on CT angio September 11, 09 -  stable for 2 years and consistent with benign disease  . Pulmonary embolism [facility])  September 07, 2005    her CT angiogram - positive for  pulmonary emboli to several branches of the right lower lobe- relatively small clot burden, clear lung; patient started on Coumadin; CT angiogram on January 10, 2006 showed resolution of previously seen pulmonary emboli with minimal basilar atelectasis  . Schizophrenia [facility])     Patient Active Problem List   Diagnosis Date Noted  . Acute left-sided low back pain without sciatica 04/21/2018  . Diverticulosis 04/18/2018  . Memory loss 03/30/2018  . Headache in back of head 03/26/2018  . Sore throat 03/26/2018  . Ganglion cyst of volar aspect of left wrist 03/26/2018  . Deviated septum 03/26/2018  . Heliotrope eyelid rash ([place]) 03/06/2018  . Chest wall tenderness under left breast 03/06/2018  . Hypertension 11/21/2017  . Herpes zoster 05/12/2017  . Acute deep vein thrombosis (DVT) of brachial vein of left upper extremity ([place]) 12/16/2016  . Right leg pain 09/09/2016  . Depression 06/20/2016  . Preventative health care 06/20/2016  . Pelvic mass in female 06/20/2016  . Chest pain 02/15/2016  . Secondary amenorrhea 09/14/2014  . Allergic rhinitis with Asthma. 09/14/2014  . GERD (gastroesophageal reflux disease) 09/14/2013  . Breast mass, right 09/14/2013  . Healthcare maintenance 09/14/2013  . Cervical mass 02/08/2013  . H/O cesarean section 12/21/2012  .  Tobacco use disorder 06/22/2012  . Long term current use of anticoagulant 10/20/2010  . DEPRESSION, HX OF 05/16/2010  . Iron deficiency anemia 10/12/2006  . LEUKOCYTOPENIA NOS 10/12/2006  . HYPERCOAGULABLE STATE, PRIMARY 10/12/2006    Past Surgical History:  Procedure Laterality Date  . CESAREAN SECTION      History of 5 C-section  . EXPLORATORY LAPAROTOMY WITH ABDOMINAL MASS EXCISION  02/2005  . TUBAL LIGATION       OB History    Gravida  5   Para  4   Term  4   Preterm  0   AB  0   Living  5     SAB  0   TAB  0   Ectopic  0   Multiple  0   Live Births               Home Medications    Prior to  Admission medications   Medication Sig Start Date End Date Taking? Authorizing Provider  acetaminophen (TYLENOL) 325 MG tablet Take 650 mg by mouth every 6 (six) hours as needed for mild pain.    [provider]  albuterol (PROVENTIL HFA) 108 (90 Base) MCG/ACT inhaler INHALE 2 PUFFS INTO THE LUNGS EVERY 6 HOURS AS NEEDED FOR WHEEZING. FOR SHORTNESS OF BREATH 12/11/16   Ophelia Shoulder, MD  amLODipine (NORVASC) 5 MG tablet Take 1 tablet (5 mg total) by mouth daily. 04/09/18 04/09/19  Neva Seat, MD  ARIPiprazole (ABILIFY) 10 MG tablet Take 1 tablet (10 mg total) by mouth daily. 03/06/18   Colbert Ewing, MD  cyclobenzaprine (FLEXERIL) 10 MG tablet Take 1 tablet (10 mg total) 2 (two) times daily as needed by mouth for muscle spasms. 08/07/17   Domenic Moras, PA-C  diclofenac sodium (VOLTAREN) 1 % GEL Apply 2 g topically 4 (four) times daily. 02/14/17   Ophelia Shoulder, MD  HYDROcodone-acetaminophen (NORCO/VICODIN) 5-325 MG tablet Take 2 tablets by mouth every 4 (four) hours as needed. 01/25/17   Esaw Grandchild, MD  hydrOXYzine (ATARAX/VISTARIL) 25 MG tablet Take 1 tablet (25 mg total) by mouth every 6 (six) hours. 04/03/18   Jacqlyn Larsen, PA-C  methocarbamol (ROBAXIN) 750 MG tablet Take 1-2 tablets (750-1,500 mg total) by mouth 3 (three) times daily as needed for muscle spasms. 04/18/18   Lorin Glass, PA-C  methylPREDNISolone (MEDROL DOSEPAK) 4 MG TBPK tablet Take as directed 04/03/18   Jacqlyn Larsen, PA-C  nicotine polacrilex (NICORETTE) 2 MG gum RX #3 Weeks 10-12: 1 piece every 4-8 hours. Max 24 pieces per day. 10/20/15   Emokpae, Ejiroghene E, MD  Olopatadine HCl (PATADAY) 0.2 % SOLN Apply 1 drop to eye daily. 03/02/18   Neva Seat, MD  omeprazole (PRILOSEC) 20 MG capsule Take 1 capsule (20 mg total) by mouth daily. 01/28/18   Neva Seat, MD  ondansetron (ZOFRAN-ODT) 4 MG disintegrating tablet Take 1 tablet (4 mg total) by mouth every 8 (eight) hours as needed for nausea or  vomiting. 12/16/16   Norman Herrlich, MD  predniSONE (DELTASONE) 20 MG tablet 2 tabs po daily x 4 days 08/07/17   Domenic Moras, PA-C  traMADol (ULTRAM) 50 MG tablet Take 1 tablet (50 mg total) every 6 (six) hours as needed by mouth. 08/10/17   Shary Decamp, PA-C  warfarin (COUMADIN) 5 MG tablet Take 2&1/2 tablets daily, except on Tuesdays and Fridays--take 3 tablets on Tuesdays and Fridays. 07/24/18   Pennie Banter, RPH-CPP    Family History Family History  Problem Relation Age of Onset  . Hypertension Mother   . Congestive Heart Failure Mother   . Diabetes Father   . Birth defects Maternal Aunt   . Birth defects Maternal Uncle   . Diabetes Paternal Grandmother   . Breast cancer Sister   . Breast cancer Sister     Social History Social History   Tobacco Use  . Smoking status: Former Smoker    Packs/day: 0.25    Years: 30.00    Pack years: 7.50    Types: Cigarettes  . Smokeless tobacco: Never Used  . Tobacco comment: Quit x 3 months. Restarted 3 weeks ago.   Substance Use Topics  . Alcohol use: Never    Alcohol/week: 0.0 standard drinks    Frequency: Never    Comment: Quit x 4 yrs.  . Drug use: Not Currently    Types: Marijuana     Allergies   Patient has no known allergies.   Review of Systems Review of Systems  Constitutional: Negative for chills, diaphoresis, fatigue and fever.  HENT: Negative for congestion, rhinorrhea and sneezing.   Eyes: Negative.   Respiratory: Negative for cough, chest tightness and shortness of breath.   Cardiovascular: Negative for chest pain and leg swelling.  Gastrointestinal: Negative for abdominal pain, blood in stool, diarrhea, nausea and vomiting.  Genitourinary: Negative for difficulty urinating, flank pain, frequency and hematuria.  Musculoskeletal: Negative for arthralgias and back pain.  Skin: Negative for rash.  Neurological: Positive for numbness. Negative for dizziness, speech difficulty, weakness and headaches.      Physical Exam Updated Vital Signs BP (!) 140/95 (BP Location: Right Arm)   Pulse 74   Temp 98.5 F (36.9 C) (Oral)   Resp 18   SpO2 100%   Physical Exam  Constitutional: She is oriented to person, place, and time. She appears well-developed and well-nourished.  HENT:  Head: Normocephalic and atraumatic.  Eyes: Pupils are equal, round, and reactive to light.  Neck: Normal range of motion. Neck supple.  Cardiovascular: Normal rate, regular rhythm and normal heart sounds.  Pulmonary/Chest: Effort normal and breath sounds normal. No respiratory distress. She has no wheezes. She has no rales. She exhibits no tenderness.  Abdominal: Soft. Bowel sounds are normal. There is no tenderness. There is no rebound and no guarding.  Musculoskeletal: Normal range of motion. She exhibits no edema.  Patient has some duskiness to the fingertips in both hands but primarily in the left hand.  Radial pulses are strong and palpable bilaterally.  Ulnar pulses are strong by Doppler.  She has no significant pain on palpation of her wrist.  When she flexes her wrist, the numbness seems to get a little bit worse.  There is no pain on palpation of the neck.  No swelling to the hands.  Lymphadenopathy:    She has no cervical adenopathy.  Neurological: She is alert and oriented to person, place, and time.  Skin: Skin is warm and dry. No rash noted.  Psychiatric: She has a normal mood and affect.     ED Treatments / Results  Labs (all labs ordered are listed, but only abnormal results are displayed) Labs Reviewed - No data to display  EKG None  Radiology No results found.  Procedures Procedures (including critical care time)  Medications Ordered in ED Medications - No data to display   Initial Impression / Assessment and Plan / ED Course  I have reviewed the triage vital signs and the nursing notes.  Pertinent  labs & imaging results that were available during my care of the patient were  reviewed by me and considered in my medical decision making (see chart for details).     Patient presents with some tingling in the fingertips of both hands.  She has full motor strength in both hands.  Pulses are intact.  Once her hands were warmed, the duskiness went away.  I question whether this is a form of Raynauds.  She does have some worsening symptoms when she flexes her hand at the wrists because she could have some carpal tunnel component.  Although it is in all 5 the fingertips versus in the normal median nerve distribution.  She does not seem to have any vascular/perfusion abnormality.  There is no swelling which would be more concerning for a DVT.  Is also bilateral which would go against DVT.  She does not have any chest symptoms.  I have given her wrist splints for symptomatic relief.  I also advised her if her fingers get dusky to put them in warm water and see if it resolves with that.  Instructed her to have follow-up this week with her PCP who is the internal medicine teaching service.  Return precautions were given.  Final Clinical Impressions(s) / ED Diagnoses   Final diagnoses:  Paresthesia    ED Discharge Orders    None       Malvin Johns, MD 08/09/18 [phone]

## 2018-08-10 ENCOUNTER — Ambulatory Visit: Payer: Medicaid Other | Admitting: Internal Medicine

## 2018-08-10 ENCOUNTER — Other Ambulatory Visit: Payer: Self-pay

## 2018-08-10 DIAGNOSIS — G5603 Carpal tunnel syndrome, bilateral upper limbs: Secondary | ICD-10-CM | POA: Diagnosis not present

## 2018-08-10 NOTE — Progress Notes (Signed)
   CC: tingling in the fingers   HPI:  [name] is a 42 y.o. with PMH as listed below who presents for tingling of the fingers.   Please see the assessment and plans for the status of the patient chronic medical problems.    Past Medical History:  Diagnosis Date  . Anemia 2007    microcytic anemia, baseline hemoglobin 10-11, MCV at baseline 72-77, secondary to iron deficiency  . Arm DVT (deep venous thromboembolism), acute [facility])  September 23, 2009, January 05, 2010    Doppler study significant with indeterminant age DVT involving the left upper extremity, Doppler performed January 05, 2010 consistent with acute DVT involving the left upper extremity  . Asthma   . Depression   . Hypertension   . Leukopenia 2008    unclear etiology baseline WBC  2.8-3.7  . Lung nodule  June 10, 2008    stable tiny noduke noted along the minor fissure of the right lung on CT angio September 11, 09 -  stable for 2 years and consistent with benign disease  . Pulmonary embolism [facility])  September 07, 2005    her CT angiogram - positive for pulmonary emboli to several branches of the right lower lobe- relatively small clot burden, clear lung; patient started on Coumadin; CT angiogram on January 10, 2006 showed resolution of previously seen pulmonary emboli with minimal basilar atelectasis  . Schizophrenia ([place])    Review of Systems:  Refer to history of present illness and assessment and plans for pertinent review of systems, all others reviewed and negative  Physical Exam:  Vitals:   08/10/18 1401  BP: 140/88  Pulse: 87  Temp: 98.9 F (37.2 C)  TempSrc: Oral  SpO2: 92%  Weight: 234 lb 3.2 oz (106.2 kg)  Height: 5' 4.5" (1.638 m)   General: well appearing, no acute distress  Cardiac: pulses strong and intact in bilateral upper extremities, no edema in upper extremities  Pulm: normal work of breathing  MSK: phalens and tinnels reproduce the tingling, provocation testing at the elbow does not,  strength is 5/5 in bilateral proximal and distal upper extremity muscles, no cervical tenderness   Assessment & Plan:   Carpal tunnel syndrome  About one week ago she woke up with numbness and swelling in both hands. She attempted to warm up the hands by flexing them it took about 7 minutes for the sensation to go away. 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  The numbness continues to affect her at night. She has had no further swelling. Yesterday she went to the ED, she received two wrist stabilizing braces and has been using them since and noticed that the numbness has significantly improved. Yesterday the hands seemed purple in color however generally she does not notice that the hands are painful or change color with exposure to changes in temperature.  Symptoms and exam at this time seem consistent with carpal tunnel syndrome. She is currently undergoing autoimmune workup and has follow up scheduled with rheumatology in the next week. Differentials for bilateral CTS including cervical polyradiculopathy have been consider however improvement with wrist splinting, intact strength, with positive carpal tunnel testing and the absence of back pain make this less likely. We discussed continuing wrist splinting and discussed return precautions.   See Encounters Tab for problem based charting.  Patient discussed with Dr. Angelia Mould

## 2018-08-10 NOTE — Patient Instructions (Addendum)
Thank you for coming to the clinic today. It was a pleasure to see you.   For your hand pain, it seems that you may have carpal tunnel syndrome- please continue to use the hand splints as much as possible, especially at night. You should use NSAIDs and tylenol for the pain  Please dont forget your follow up with rheumatology at [place] When: 2 - 3 months with Dr. Trilby Drummer  For: follow up of your  What to bring: all of your medication bottles  Please call the internal medicine center clinic if you have any questions or concerns, we may be able to help and keep you from a long and expensive emergency room wait. Our clinic and after hours phone number is [phone], the best time to call is Monday through Friday 9 am to 4 pm but there is always someone available 24/7 if you have an emergency. If you need medication refills please notify your pharmacy one week in advance and they will send [place] a request.

## 2018-08-10 NOTE — Progress Notes (Signed)
INTERNAL MEDICINE TEACHING ATTENDING ADDENDUM - Kathrynne Kulinski M.D  Duration- indefinite, Indication- PE, DVT, INR- supra therapeutic. Agree with pharmacy recommendations as outlined in their note.      

## 2018-08-11 ENCOUNTER — Encounter: Payer: Self-pay | Admitting: Internal Medicine

## 2018-08-11 DIAGNOSIS — G5600 Carpal tunnel syndrome, unspecified upper limb: Secondary | ICD-10-CM | POA: Insufficient documentation

## 2018-08-11 HISTORY — DX: Carpal tunnel syndrome, unspecified upper limb: G56.00

## 2018-08-11 NOTE — Assessment & Plan Note (Addendum)
About one week ago she woke up with numbness and swelling in both hands. She attempted to warm up the hands by flexing them it took about 7 minutes for the sensation to go away. 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  The numbness continues to affect her at night. She has had no further swelling. Yesterday she went to the ED, she received two wrist stabilizing braces and has been using them since and noticed that the numbness has significantly improved. Yesterday the hands seemed purple in color however generally she does not notice that the hands are painful or change color with exposure to changes in temperature.  Symptoms and exam at this time seem consistent with carpal tunnel syndrome. She is currently undergoing autoimmune workup and has follow up scheduled with rheumatology in the next week. Differentials for bilateral CTS including cervical polyradiculopathy have been consider however improvement with wrist splinting, intact strength, with positive carpal tunnel testing and the absence of back pain make this less likely. We discussed continuing wrist splinting and discussed return precautions.

## 2018-08-13 ENCOUNTER — Telehealth: Payer: Self-pay | Admitting: Internal Medicine

## 2018-08-14 NOTE — Progress Notes (Signed)
Internal Medicine Clinic Attending  Case discussed with Dr. Blum at the time of the visit.  We reviewed the resident's history and exam and pertinent patient test results.  I agree with the assessment, diagnosis, and plan of care documented in the resident's note. 

## 2018-08-19 ENCOUNTER — Encounter (HOSPITAL_COMMUNITY): Payer: Self-pay | Admitting: Emergency Medicine

## 2018-08-19 ENCOUNTER — Emergency Department (HOSPITAL_COMMUNITY): Payer: Medicaid Other

## 2018-08-19 ENCOUNTER — Emergency Department (HOSPITAL_COMMUNITY)
Admission: EM | Admit: 2018-08-19 | Discharge: 2018-08-19 | Disposition: A | Discharge status: Home / Self Care | Payer: Medicaid Other | Attending: Emergency Medicine | Admitting: Emergency Medicine

## 2018-08-19 ENCOUNTER — Other Ambulatory Visit: Payer: Self-pay

## 2018-08-19 DIAGNOSIS — Z7901 Long term (current) use of anticoagulants: Secondary | ICD-10-CM | POA: Insufficient documentation

## 2018-08-19 DIAGNOSIS — R1084 Generalized abdominal pain: Secondary | ICD-10-CM | POA: Diagnosis not present

## 2018-08-19 DIAGNOSIS — Z87891 Personal history of nicotine dependence: Secondary | ICD-10-CM | POA: Insufficient documentation

## 2018-08-19 DIAGNOSIS — Z79899 Other long term (current) drug therapy: Secondary | ICD-10-CM | POA: Diagnosis not present

## 2018-08-19 DIAGNOSIS — I10 Essential (primary) hypertension: Secondary | ICD-10-CM | POA: Diagnosis not present

## 2018-08-19 LAB — CBC WITH DIFFERENTIAL/PLATELET
ABS IMMATURE GRANULOCYTES: 0.01 10*3/uL (ref 0.00–0.07)
Basophils Absolute: 0 10*3/uL (ref 0.0–0.1)
Basophils Relative: 1 %
EOS ABS: 0 10*3/uL (ref 0.0–0.5)
EOS PCT: 1 %
HCT: 45.2 % (ref 36.0–46.0)
Hemoglobin: 13.6 g/dL (ref 12.0–15.0)
IMMATURE GRANULOCYTES: 0 %
LYMPHS ABS: 1.4 10*3/uL (ref 0.7–4.0)
Lymphocytes Relative: 48 %
MCH: 23.6 pg — AB (ref 26.0–34.0)
MCHC: 30.1 g/dL (ref 30.0–36.0)
MCV: 78.3 fL — ABNORMAL LOW (ref 80.0–100.0)
MONO ABS: 0.3 10*3/uL (ref 0.1–1.0)
MONOS PCT: 10 %
Neutro Abs: 1.2 10*3/uL — ABNORMAL LOW (ref 1.7–7.7)
Neutrophils Relative %: 40 %
PLATELETS: 222 10*3/uL (ref 150–400)
RBC: 5.77 MIL/uL — AB (ref 3.87–5.11)
RDW: 14.6 % (ref 11.5–15.5)
WBC: 2.9 10*3/uL — ABNORMAL LOW (ref 4.0–10.5)
nRBC: 0 % (ref 0.0–0.2)

## 2018-08-19 LAB — URINALYSIS, ROUTINE W REFLEX MICROSCOPIC
Bilirubin Urine: NEGATIVE
GLUCOSE, UA: NEGATIVE mg/dL
KETONES UR: NEGATIVE mg/dL
LEUKOCYTES UA: NEGATIVE
NITRITE: NEGATIVE
PH: 5 (ref 5.0–8.0)
PROTEIN: NEGATIVE mg/dL
Specific Gravity, Urine: 1.027 (ref 1.005–1.030)

## 2018-08-19 LAB — I-STAT BETA HCG BLOOD, ED (MC, WL, AP ONLY): I-stat hCG, quantitative: <5 m[IU]/mL (ref <5)

## 2018-08-19 LAB — COMPREHENSIVE METABOLIC PANEL
ALK PHOS: 41 U/L (ref 38–126)
ALT: 43 U/L (ref 0–44)
ANION GAP: 7 (ref 5–15)
AST: 30 U/L (ref 15–41)
Albumin: 3.5 g/dL (ref 3.5–5.0)
BILIRUBIN TOTAL: 0.5 mg/dL (ref 0.3–1.2)
BUN: 18 mg/dL (ref 6–20)
CALCIUM: 9.2 mg/dL (ref 8.9–10.3)
CO2: 21 mmol/L — ABNORMAL LOW (ref 22–32)
CREATININE: 1.02 mg/dL — AB (ref 0.44–1.00)
Chloride: 109 mmol/L (ref 98–111)
GFR calc Af Amer: >60 mL/min (ref >60)
GFR calc non Af Amer: >60 mL/min (ref >60)
Glucose, Bld: 102 mg/dL — ABNORMAL HIGH (ref 70–99)
Potassium: 3.8 mmol/L (ref 3.5–5.1)
Sodium: 137 mmol/L (ref 135–145)
TOTAL PROTEIN: 6.8 g/dL (ref 6.5–8.1)

## 2018-08-19 LAB — LIPASE, BLOOD: Lipase: 24 U/L (ref 11–51)

## 2018-08-19 MED ORDER — LACTATED RINGERS IV BOLUS
1000.0000 mL | Freq: Once | INTRAVENOUS | Status: AC
  Administered 2018-08-19: 1000 mL via INTRAVENOUS

## 2018-08-19 MED ORDER — IOHEXOL 300 MG/ML  SOLN
100.0000 mL | Freq: Once | INTRAMUSCULAR | Status: AC | PRN
  Administered 2018-08-19: 100 mL via INTRAVENOUS

## 2018-08-19 MED ORDER — FENTANYL CITRATE (PF) 100 MCG/2ML IJ SOLN
50.0000 ug | Freq: Once | INTRAMUSCULAR | Status: AC
  Administered 2018-08-19: 50 ug via INTRAVENOUS
  Filled 2018-08-19: qty 2

## 2018-08-19 NOTE — ED Provider Notes (Signed)
[place] [facility] EMERGENCY DEPARTMENT Provider Note   CSN: [id number] Arrival date & time: 08/19/18  1704     History   Chief Complaint Chief Complaint  Patient presents with  . Abdominal Pain    HPI MOREEN PIGGOTT is a 42 y.o. female.  The history is provided by the patient.  Abdominal Pain   This is a new problem. The current episode started 12 to 24 hours ago. The problem occurs constantly. The problem has not changed since onset.The pain is associated with an unknown factor. The pain is located in the RLQ and RUQ. The quality of the pain is aching and dull. The pain is at a severity of 2/10. The pain is mild. Pertinent negatives include anorexia, fever, belching, diarrhea, flatus, hematochezia, melena, nausea, vomiting, constipation, dysuria, frequency, hematuria, headaches, arthralgias and myalgias. Nothing aggravates the symptoms. Nothing relieves the symptoms. Past workup does not include GI consult. Her past medical history does not include PUD.    Past Medical History:  Diagnosis Date  . Anemia 2007    microcytic anemia, baseline hemoglobin 10-11, MCV at baseline 72-77, secondary to iron deficiency  . Arm DVT (deep venous thromboembolism), acute [facility])  September 23, 2009, January 05, 2010    Doppler study significant with indeterminant age DVT involving the left upper extremity, Doppler performed January 05, 2010 consistent with acute DVT involving the left upper extremity  . Asthma   . Depression   . Hypertension   . Leukopenia 2008    unclear etiology baseline WBC  2.8-3.7  . Lung nodule  June 10, 2008    stable tiny noduke noted along the minor fissure of the right lung on CT angio September 11, 09 -  stable for 2 years and consistent with benign disease  . Pulmonary embolism [facility])  September 07, 2005    her CT angiogram - positive for pulmonary emboli to several branches of the right lower lobe- relatively small clot burden, clear lung; patient started on  Coumadin; CT angiogram on January 10, 2006 showed resolution of previously seen pulmonary emboli with minimal basilar atelectasis  . Schizophrenia [facility])     Patient Active Problem List   Diagnosis Date Noted  . Carpal tunnel syndrome 08/11/2018  . Acute left-sided low back pain without sciatica 04/21/2018  . Diverticulosis 04/18/2018  . Memory loss 03/30/2018  . Headache in back of head 03/26/2018  . Sore throat 03/26/2018  . Ganglion cyst of volar aspect of left wrist 03/26/2018  . Deviated septum 03/26/2018  . Heliotrope eyelid rash ([place]) 03/06/2018  . Chest wall tenderness under left breast 03/06/2018  . Hypertension 11/21/2017  . Herpes zoster 05/12/2017  . Acute deep vein thrombosis (DVT) of brachial vein of left upper extremity ([place]) 12/16/2016  . Right leg pain 09/09/2016  . Depression 06/20/2016  . Preventative health care 06/20/2016  . Pelvic mass in female 06/20/2016  . Chest pain 02/15/2016  . Secondary amenorrhea 09/14/2014  . Allergic rhinitis with Asthma. 09/14/2014  . GERD (gastroesophageal reflux disease) 09/14/2013  . Breast mass, right 09/14/2013  . Healthcare maintenance 09/14/2013  . Cervical mass 02/08/2013  . H/O cesarean section 12/21/2012  . Tobacco use disorder 06/22/2012  . Long term current use of anticoagulant 10/20/2010  . DEPRESSION, HX OF 05/16/2010  . Iron deficiency anemia 10/12/2006  . LEUKOCYTOPENIA NOS 10/12/2006  . HYPERCOAGULABLE STATE, PRIMARY 10/12/2006    Past Surgical History:  Procedure Laterality Date  . CESAREAN SECTION  History of 5 C-section  . EXPLORATORY LAPAROTOMY WITH ABDOMINAL MASS EXCISION  02/2005  . TUBAL LIGATION       OB History    Gravida  5   Para  4   Term  4   Preterm  0   AB  0   Living  5     SAB  0   TAB  0   Ectopic  0   Multiple  0   Live Births               Home Medications    Prior to Admission medications   Medication Sig Start Date End Date Taking? Authorizing  Provider  acetaminophen (TYLENOL) 325 MG tablet Take 650 mg by mouth every 6 (six) hours as needed for mild pain.   Yes [provider]  albuterol (PROVENTIL HFA) 108 (90 Base) MCG/ACT inhaler INHALE 2 PUFFS INTO THE LUNGS EVERY 6 HOURS AS NEEDED FOR WHEEZING. FOR SHORTNESS OF BREATH 12/11/16  Yes Ophelia Shoulder, MD  amLODipine (NORVASC) 5 MG tablet Take 1 tablet (5 mg total) by mouth daily. 04/09/18 04/09/19 Yes Neva Seat, MD  ARIPiprazole (ABILIFY) 10 MG tablet Take 1 tablet (10 mg total) by mouth daily. 03/06/18  Yes Colbert Ewing, MD  omeprazole (PRILOSEC) 20 MG capsule Take 1 capsule (20 mg total) by mouth daily. 01/28/18  Yes Neva Seat, MD  warfarin (COUMADIN) 5 MG tablet Take 2&1/2 tablets daily, except on Tuesdays and Fridays--take 3 tablets on Tuesdays and Fridays. 07/24/18  Yes Pennie Banter, RPH-CPP  cyclobenzaprine (FLEXERIL) 10 MG tablet Take 1 tablet (10 mg total) 2 (two) times daily as needed by mouth for muscle spasms. Patient not taking: Reported on 08/19/2018 08/07/17   Domenic Moras, PA-C  diclofenac sodium (VOLTAREN) 1 % GEL Apply 2 g topically 4 (four) times daily. Patient not taking: Reported on 08/19/2018 02/14/17   Ophelia Shoulder, MD  HYDROcodone-acetaminophen (NORCO/VICODIN) 5-325 MG tablet Take 2 tablets by mouth every 4 (four) hours as needed. Patient not taking: Reported on 08/19/2018 01/25/17   Esaw Grandchild, MD  hydrOXYzine (ATARAX/VISTARIL) 25 MG tablet Take 1 tablet (25 mg total) by mouth every 6 (six) hours. Patient not taking: Reported on 08/19/2018 04/03/18   Jacqlyn Larsen, PA-C  methocarbamol (ROBAXIN) 750 MG tablet Take 1-2 tablets (750-1,500 mg total) by mouth 3 (three) times daily as needed for muscle spasms. Patient not taking: Reported on 08/19/2018 04/18/18   Lorin Glass, PA-C  methylPREDNISolone (MEDROL DOSEPAK) 4 MG TBPK tablet Take as directed Patient not taking: Reported on 08/19/2018 04/03/18   Jacqlyn Larsen, PA-C  nicotine  polacrilex (NICORETTE) 2 MG gum RX #3 Weeks 10-12: 1 piece every 4-8 hours. Max 24 pieces per day. Patient not taking: Reported on 08/19/2018 10/20/15   Bethena Roys, MD  Olopatadine HCl (PATADAY) 0.2 % SOLN Apply 1 drop to eye daily. Patient not taking: Reported on 08/19/2018 03/02/18   Neva Seat, MD  ondansetron (ZOFRAN-ODT) 4 MG disintegrating tablet Take 1 tablet (4 mg total) by mouth every 8 (eight) hours as needed for nausea or vomiting. Patient not taking: Reported on 08/19/2018 12/16/16   Norman Herrlich, MD  predniSONE (DELTASONE) 20 MG tablet 2 tabs po daily x 4 days Patient not taking: Reported on 08/19/2018 08/07/17   Domenic Moras, PA-C  traMADol (ULTRAM) 50 MG tablet Take 1 tablet (50 mg total) every 6 (six) hours as needed by mouth. Patient not taking: Reported on 08/19/2018 08/10/17  Shary Decamp, PA-C    Family History Family History  Problem Relation Age of Onset  . Hypertension Mother   . Congestive Heart Failure Mother   . Diabetes Father   . Birth defects Maternal Aunt   . Birth defects Maternal Uncle   . Diabetes Paternal Grandmother   . Breast cancer Sister   . Breast cancer Sister     Social History Social History   Tobacco Use  . Smoking status: Former Smoker    Packs/day: 0.25    Years: 30.00    Pack years: 7.50    Types: Cigarettes  . Smokeless tobacco: Never Used  . Tobacco comment: Quit x 3 months. Restarted 3 weeks ago.   Substance Use Topics  . Alcohol use: Never    Alcohol/week: 0.0 standard drinks    Frequency: Never    Comment: Quit x 4 yrs.  . Drug use: Not Currently    Types: Marijuana     Allergies   Patient has no known allergies.   Review of Systems Review of Systems  Constitutional: Negative for chills and fever.  HENT: Negative for ear pain and sore throat.   Eyes: Negative for pain and visual disturbance.  Respiratory: Negative for cough and shortness of breath.   Cardiovascular: Negative for chest pain and  palpitations.  Gastrointestinal: Positive for abdominal pain. Negative for anorexia, constipation, diarrhea, flatus, hematochezia, melena, nausea and vomiting.  Genitourinary: Negative for decreased urine volume, difficulty urinating, dyspareunia, dysuria, enuresis, flank pain, frequency, hematuria, menstrual problem, pelvic pain, urgency, vaginal bleeding, vaginal discharge and vaginal pain.  Musculoskeletal: Negative for arthralgias, back pain and myalgias.  Skin: Negative for color change and rash.  Neurological: Negative for seizures, syncope and headaches.  All other systems reviewed and are negative.    Physical Exam Updated Vital Signs  ED Triage Vitals  Enc Vitals Group     BP 08/19/18 1709 (!) 138/98     Pulse Rate 08/19/18 1709 (!) 104     Resp 08/19/18 1709 17     Temp 08/19/18 1709 99.1 F (37.3 C)     Temp Source 08/19/18 1709 Oral     SpO2 08/19/18 1709 99 %     Weight 08/19/18 1709 232 lb (105.2 kg)     Height 08/19/18 1709 5\' 4"  (1.626 m)     Head Circumference --      Peak Flow --      Pain Score 08/19/18 1714 5     Pain Loc --      Pain Edu? --      Excl. in [place]? --     Physical Exam  Constitutional: She appears well-developed and well-nourished. No distress.  HENT:  Head: Normocephalic and atraumatic.  Mouth/Throat: Oropharynx is clear and moist. No oropharyngeal exudate.  Eyes: Pupils are equal, round, and reactive to light. Conjunctivae and EOM are normal.  Neck: Neck supple.  Cardiovascular: Normal rate and regular rhythm.  No murmur heard. Pulmonary/Chest: Effort normal and breath sounds normal. No respiratory distress.  Abdominal: Soft. Normal appearance. There is tenderness (mild) in the right upper quadrant and right lower quadrant. There is no rigidity, no rebound, no guarding, no CVA tenderness, no tenderness at McBurney's point and negative Murphy's sign.  Musculoskeletal: She exhibits no edema.  Neurological: She is alert.  Skin: Skin is warm  and dry. Capillary refill takes less than 2 seconds.  Psychiatric: She has a normal mood and affect.  Nursing note and vitals reviewed.  ED Treatments / Results  Labs (all labs ordered are listed, but only abnormal results are displayed) Labs Reviewed  COMPREHENSIVE METABOLIC PANEL - Abnormal; Notable for the following components:      Result Value   CO2 21 (*)    Glucose, Bld 102 (*)    Creatinine, Ser 1.02 (*)    All other components within normal limits  CBC WITH DIFFERENTIAL/PLATELET - Abnormal; Notable for the following components:   WBC 2.9 (*)    RBC 5.77 (*)    MCV 78.3 (*)    MCH 23.6 (*)    Neutro Abs 1.2 (*)    All other components within normal limits  URINALYSIS, ROUTINE W REFLEX MICROSCOPIC - Abnormal; Notable for the following components:   Hgb urine dipstick MODERATE (*)    Bacteria, UA RARE (*)    All other components within normal limits  LIPASE, BLOOD  I-STAT BETA HCG BLOOD, ED ([facility], [facility], [facility] ONLY)    EKG None  Radiology Ct Abdomen Pelvis W Contrast  Result Date: 08/19/2018 CLINICAL DATA:  Right lower quadrant pain x3 days. EXAM: CT ABDOMEN AND PELVIS WITH CONTRAST TECHNIQUE: Multidetector CT imaging of the abdomen and pelvis was performed using the standard protocol following bolus administration of intravenous contrast. CONTRAST:  147mL OMNIPAQUE IOHEXOL 300 MG/ML  SOLN COMPARISON:  04/18/2018 FINDINGS: Lower chest: Top normal heart size without pericardial effusion. Clear lung bases. Hepatobiliary: No focal liver abnormality is seen. No gallstones, gallbladder wall thickening, or biliary dilatation. Pancreas: Unremarkable. No pancreatic ductal dilatation or surrounding inflammatory changes. Spleen: Normal in size without focal abnormality. Adrenals/Urinary Tract: Adrenal glands are unremarkable. Kidneys are normal, without renal calculi, focal lesion, or hydronephrosis. Bladder is unremarkable. Stomach/Bowel: Stomach is within normal limits. Appendix appears  normal. No evidence of bowel wall thickening, distention, or inflammatory changes. Colonic diverticulosis without acute diverticulitis is redemonstrated. Vascular/Lymphatic: No significant vascular findings are present. No enlarged abdominal or pelvic lymph nodes. Reproductive: Uterus and bilateral adnexa are unremarkable. Other: No abdominal wall hernia or abnormality. No abdominopelvic ascites. Musculoskeletal: No acute or significant osseous findings. IMPRESSION: 1. No acute intraabdominal or pelvic abormality. 2. Colonic diverticulosis without acute diverticulitis. Normal-appearing appendix. Electronically Signed   By: Ashley Royalty M.D.   On: 08/19/2018 19:27    Procedures Procedures (including critical care time)  Medications Ordered in ED Medications  fentaNYL (SUBLIMAZE) injection 50 mcg (50 mcg Intravenous Given 08/19/18 1814)  lactated ringers bolus 1,000 mL (1,000 mLs Intravenous New Bag/Given 08/19/18 1814)  iohexol (OMNIPAQUE) 300 MG/ML solution 100 mL (100 mLs Intravenous Contrast Given 08/19/18 1847)     Initial Impression / Assessment and Plan / ED Course  I have reviewed the triage vital signs and the nursing notes.  Pertinent labs & imaging results that were available during my care of the patient were reviewed by me and considered in my medical decision making (see chart for details).     JAMELIA VARANO is a 42 year old female with history of anemia, hypertension, schizophrenia who presents to the ED with abdominal pain.  Patient with overall unremarkable vitals.  Patient with low-grade fever but otherwise unremarkable vitals.  Patient with right-sided abdominal pain for the last several hours.  Not associated with eating.  Denies any urinary symptoms.  No vaginal discharge, vaginal bleeding.  Patient with normal bowel movements except for may be mild constipation.  No nausea, no vomiting.  Patient is tender to right lower quadrant on exam.  Otherwise no peritonitis.  Exam is  overall unremarkable.  Lab work showed chronic leukopenia, no significant anemia.  Patient with no urinary tract infection.  No significant electrolyte abnormality or kidney injury.  CT scan showed no acute findings.  No appendicitis, no bowel obstruction.  Pain improved following IV fluids and IV pain medicine.  Suspect likely constipation versus muscular skeletal process.  Possibly viral process.  Recommend Tylenol, Motrin for home.  Told to increase oral hydration.  Given return precautions.  Recommend follow-up with primary care doctor.  This chart was dictated using voice recognition software.  Despite best efforts to proofread,  errors can occur which can change the documentation meaning.   Final Clinical Impressions(s) / ED Diagnoses   Final diagnoses:  Generalized abdominal pain    ED Discharge Orders    None       Lennice Sites, DO 08/19/18 1932

## 2018-08-19 NOTE — ED Triage Notes (Signed)
Pt reports constant R lower abdominal pain since Sunday. Pt reports taking tylenol and hydrocodone with no relief. Pt denies N/V/D. Pt reports urinary frequency, denies burning with urination.

## 2018-08-25 ENCOUNTER — Other Ambulatory Visit: Payer: Self-pay | Admitting: Internal Medicine

## 2018-08-26 NOTE — Telephone Encounter (Signed)
Refill Approved

## 2018-08-27 ENCOUNTER — Emergency Department (HOSPITAL_COMMUNITY)
Admission: EM | Admit: 2018-08-27 | Discharge: 2018-08-27 | Disposition: A | Discharge status: Home / Self Care | Payer: Medicaid Other | Attending: Emergency Medicine | Admitting: Emergency Medicine

## 2018-08-27 ENCOUNTER — Encounter (HOSPITAL_COMMUNITY): Payer: Self-pay | Admitting: Pharmacy Technician

## 2018-08-27 ENCOUNTER — Other Ambulatory Visit: Payer: Self-pay

## 2018-08-27 ENCOUNTER — Emergency Department (HOSPITAL_COMMUNITY): Payer: Medicaid Other

## 2018-08-27 DIAGNOSIS — F17210 Nicotine dependence, cigarettes, uncomplicated: Secondary | ICD-10-CM | POA: Insufficient documentation

## 2018-08-27 DIAGNOSIS — I10 Essential (primary) hypertension: Secondary | ICD-10-CM | POA: Diagnosis not present

## 2018-08-27 DIAGNOSIS — W19XXXS Unspecified fall, sequela: Secondary | ICD-10-CM

## 2018-08-27 DIAGNOSIS — W1830XD Fall on same level, unspecified, subsequent encounter: Secondary | ICD-10-CM | POA: Insufficient documentation

## 2018-08-27 DIAGNOSIS — Z7901 Long term (current) use of anticoagulants: Secondary | ICD-10-CM | POA: Insufficient documentation

## 2018-08-27 DIAGNOSIS — Z86718 Personal history of other venous thrombosis and embolism: Secondary | ICD-10-CM | POA: Diagnosis not present

## 2018-08-27 DIAGNOSIS — Z86711 Personal history of pulmonary embolism: Secondary | ICD-10-CM | POA: Diagnosis not present

## 2018-08-27 DIAGNOSIS — Z79899 Other long term (current) drug therapy: Secondary | ICD-10-CM | POA: Insufficient documentation

## 2018-08-27 DIAGNOSIS — G8921 Chronic pain due to trauma: Secondary | ICD-10-CM | POA: Diagnosis not present

## 2018-08-27 DIAGNOSIS — M79641 Pain in right hand: Secondary | ICD-10-CM | POA: Diagnosis present

## 2018-08-27 DIAGNOSIS — S6991XA Unspecified injury of right wrist, hand and finger(s), initial encounter: Secondary | ICD-10-CM

## 2018-08-27 MED ORDER — ACETAMINOPHEN 500 MG PO TABS
1000.0000 mg | ORAL_TABLET | Freq: Once | ORAL | Status: AC
  Administered 2018-08-27: 1000 mg via ORAL
  Filled 2018-08-27: qty 2

## 2018-08-27 NOTE — ED Notes (Signed)
Patient verbalizes understanding of discharge instructions. Opportunity for questioning and answers were provided. Armband removed by staff, pt discharged from ED ambulatory.   

## 2018-08-27 NOTE — ED Triage Notes (Signed)
Pt c/o mechanical fall several weeks ago and now c/o R thumb pain/swelling/throbbing. CMS intact.

## 2018-08-27 NOTE — Discharge Instructions (Signed)
Thank you for allowing me to care for you today in the Emergency Department.   Call [facility] tomorrow or Monday to schedule a follow-up appointment with hand surgery for reevaluation.  Take 1000 mg of Tylenol every 8 hours for pain.   Return to the emergency department if you have another fall or injury, if the tip of your finger turns blue, if you have worsening numbness that spreads up the forearm or upper arm, if you become unable to move your forearm or upper arm because it is weak, or if you develop other new, concerning symptoms.  It is important to keep your splint clean and dry.  Cover it with a plastic bag when you bathe or shower to avoid getting it wet.  Do not remove the splint until you are reevaluated by hand surgery.

## 2018-08-27 NOTE — ED Provider Notes (Signed)
[facility] EMERGENCY DEPARTMENT Provider Note   CSN: [id number] Arrival date & time: 08/27/18  1520     History   Chief Complaint Chief Complaint  Patient presents with  . Finger Injury    HPI Michele Gillespie is a 42 y.o. female with a history of HTN, depression, asthma, DVT on warfarin, PE and schizophrenia who presents to the emergency department with a chief complaint of right thumb pain.  Patient reports that she fell several weeks ago at work when she twisted her ankle.  She states that she landed on her right side with her right arm outstretched.  She reports that she was seen in the emergency department after she developed tingling and numbness in all digits of her bilateral hands and was placed in a brace for suspected carpal tunnel syndrome. She has been wearing the brace until today when she took it off to make dinner.   She reports continued bruising, mild swelling, and numbness in her right thumb only.  She reports associated pain that is isolated to the right thumb.  Pain is worse with range of motion and improved with not moving the thumb.  She states that the thumb feels more weak and that she has been unable to grip things with the digit and has had difficulty performing other movements, liking wiping.  She denies right hand or wrist pain, rash, warmth to the digit, or new falls or injuries.  She has been taking Tylenol, last dose yesterday as well as applying Salonpas ointment to the digit with no improvement.   No history of right hand or thumb surgery or previous injury.  The history is provided by the patient. No language interpreter was used.    Past Medical History:  Diagnosis Date  . Anemia 2007    microcytic anemia, baseline hemoglobin 10-11, MCV at baseline 72-77, secondary to iron deficiency  . Arm DVT (deep venous thromboembolism), acute [facility])  September 23, 2009, January 05, 2010    Doppler study significant with indeterminant age DVT  involving the left upper extremity, Doppler performed January 05, 2010 consistent with acute DVT involving the left upper extremity  . Asthma   . Depression   . Hypertension   . Leukopenia 2008    unclear etiology baseline WBC  2.8-3.7  . Lung nodule  June 10, 2008    stable tiny noduke noted along the minor fissure of the right lung on CT angio September 11, 09 -  stable for 2 years and consistent with benign disease  . Pulmonary embolism [facility])  September 07, 2005    her CT angiogram - positive for pulmonary emboli to several branches of the right lower lobe- relatively small clot burden, clear lung; patient started on Coumadin; CT angiogram on January 10, 2006 showed resolution of previously seen pulmonary emboli with minimal basilar atelectasis  . Schizophrenia [facility])     Patient Active Problem List   Diagnosis Date Noted  . Carpal tunnel syndrome 08/11/2018  . Acute left-sided low back pain without sciatica 04/21/2018  . Diverticulosis 04/18/2018  . Memory loss 03/30/2018  . Headache in back of head 03/26/2018  . Sore throat 03/26/2018  . Ganglion cyst of volar aspect of left wrist 03/26/2018  . Deviated septum 03/26/2018  . Heliotrope eyelid rash ([place]) 03/06/2018  . Chest wall tenderness under left breast 03/06/2018  . Hypertension 11/21/2017  . Herpes zoster 05/12/2017  . Acute deep vein thrombosis (DVT) of brachial vein of left upper  extremity ([place]) 12/16/2016  . Right leg pain 09/09/2016  . Depression 06/20/2016  . Preventative health care 06/20/2016  . Pelvic mass in female 06/20/2016  . Chest pain 02/15/2016  . Secondary amenorrhea 09/14/2014  . Allergic rhinitis with Asthma. 09/14/2014  . GERD (gastroesophageal reflux disease) 09/14/2013  . Breast mass, right 09/14/2013  . Healthcare maintenance 09/14/2013  . Cervical mass 02/08/2013  . H/O cesarean section 12/21/2012  . Tobacco use disorder 06/22/2012  . Long term current use of anticoagulant 10/20/2010  .  DEPRESSION, HX OF 05/16/2010  . Iron deficiency anemia 10/12/2006  . LEUKOCYTOPENIA NOS 10/12/2006  . HYPERCOAGULABLE STATE, PRIMARY 10/12/2006    Past Surgical History:  Procedure Laterality Date  . CESAREAN SECTION      History of 5 C-section  . EXPLORATORY LAPAROTOMY WITH ABDOMINAL MASS EXCISION  02/2005  . TUBAL LIGATION       OB History    Gravida  5   Para  4   Term  4   Preterm  0   AB  0   Living  5     SAB  0   TAB  0   Ectopic  0   Multiple  0   Live Births               Home Medications    Prior to Admission medications   Medication Sig Start Date End Date Taking? Authorizing Provider  acetaminophen (TYLENOL) 325 MG tablet Take 650 mg by mouth every 6 (six) hours as needed for mild pain.    [provider]  albuterol (PROVENTIL HFA) 108 (90 Base) MCG/ACT inhaler INHALE 2 PUFFS INTO THE LUNGS EVERY 6 HOURS AS NEEDED FOR WHEEZING. FOR SHORTNESS OF BREATH 12/11/16   Ophelia Shoulder, MD  amLODipine (NORVASC) 5 MG tablet Take 1 tablet (5 mg total) by mouth daily. 04/09/18 04/09/19  Neva Seat, MD  ARIPiprazole (ABILIFY) 10 MG tablet Take 1 tablet (10 mg total) by mouth daily. 03/06/18   Colbert Ewing, MD  omeprazole (PRILOSEC) 20 MG capsule TAKE 1 CAPSULE BY MOUTH EVERY DAY 08/26/18   Neva Seat, MD  warfarin (COUMADIN) 5 MG tablet Take 2&1/2 tablets daily, except on Tuesdays and Fridays--take 3 tablets on Tuesdays and Fridays. 07/24/18   Pennie Banter, RPH-CPP    Family History Family History  Problem Relation Age of Onset  . Hypertension Mother   . Congestive Heart Failure Mother   . Diabetes Father   . Birth defects Maternal Aunt   . Birth defects Maternal Uncle   . Diabetes Paternal Grandmother   . Breast cancer Sister   . Breast cancer Sister     Social History Social History   Tobacco Use  . Smoking status: Former Smoker    Packs/day: 0.25    Years: 30.00    Pack years: 7.50    Types: Cigarettes  . Smokeless  tobacco: Never Used  . Tobacco comment: Quit x 3 months. Restarted 3 weeks ago.   Substance Use Topics  . Alcohol use: Never    Alcohol/week: 0.0 standard drinks    Frequency: Never    Comment: Quit x 4 yrs.  . Drug use: Not Currently    Types: Marijuana     Allergies   Patient has no known allergies.   Review of Systems Review of Systems  Constitutional: Negative for activity change.  Respiratory: Negative for shortness of breath.   Cardiovascular: Negative for chest pain.  Gastrointestinal: Negative for abdominal pain.  Musculoskeletal: Positive for arthralgias and myalgias. Negative for back pain.  Skin: Positive for color change. Negative for pallor, rash and wound.  Neurological: Positive for numbness. Negative for dizziness, syncope and weakness.     Physical Exam Updated Vital Signs BP (!) 132/91 (BP Location: Left Arm)   Pulse 92   Temp 98.3 F (36.8 C) (Oral)   Resp 18   Ht 5\' 4"  (1.626 m)   Wt 105 kg   SpO2 98%   BMI 39.73 kg/m   Physical Exam  Constitutional: No distress.  HENT:  Head: Normocephalic.  Eyes: Conjunctivae are normal.  Neck: Neck supple.  Cardiovascular: Normal rate and regular rhythm. Exam reveals no gallop and no friction rub.  No murmur heard. Pulmonary/Chest: Effort normal. No respiratory distress.  Abdominal: Soft. She exhibits no distension.  Musculoskeletal: She exhibits edema and tenderness. She exhibits no deformity.  Tender to palpation to the proximal phalanx of the right thumb.  Tender to palpation over the first MCP and IP joint.  She has decreased strength against resistance with flexion and extension of the thumb on the right as compared to the left secondary to pain.  There is minimal edema and ecchymosis noted on the palmar surface at the base of the thumb.   Full active and passive range of motion without pain of the right wrist and elbow.  Full active and passive range of motion of digits 2 through 5 of the right  hand.  Sensation is intact and equal to all 4 distal aspects of the right thumb.  Capillary refill is less than 2 seconds.  No overlying warmth or erythema.  Neurological: She is alert.  Skin: Skin is warm. No rash noted.  Psychiatric: Her behavior is normal.  Nursing note and vitals reviewed.    ED Treatments / Results  Labs (all labs ordered are listed, but only abnormal results are displayed) Labs Reviewed - No data to display  EKG None  Radiology Dg Hand Complete Right  Result Date: 08/27/2018 CLINICAL DATA:  Fall 2 weeks ago.  Pain at base of thumb. EXAM: RIGHT HAND - COMPLETE 3+ VIEW COMPARISON:  None. FINDINGS: There is no evidence of fracture or dislocation. There is no evidence of arthropathy or other focal bone abnormality. Soft tissues are unremarkable. IMPRESSION: Negative. Electronically Signed   By: Dorise Bullion III M.D   On: 08/27/2018 15:51    Procedures Procedures (including critical care time)  Medications Ordered in ED Medications  acetaminophen (TYLENOL) tablet 1,000 mg (1,000 mg Oral Given 08/27/18 1545)     Initial Impression / Assessment and Plan / ED Course  I have reviewed the triage vital signs and the nursing notes.  Pertinent labs & imaging results that were available during my care of the patient were reviewed by me and considered in my medical decision making (see chart for details).     42 year old female with a history of HTN, depression, asthma, DVT on warfarin, PE and schizophrenia resenting with continued right thumb pain for the last few weeks after she fell with her right outstretched hand.  She has been wearing a carpal tunnel brace on the right side until today, taking Tylenol, and topical analgesia without improvement.  Right hand X-ray is negative. Doubt occult fracture, dislocation, hematoma, or infection.  Tylenol given in the ED for pain.  Suspect sprain of the right thumb.  Thumb spica brace and patient education on splint  care at home was provided in the ED.  She  has been given a referral to hand surgery.  Strict return precautions given.  She is hemodynamically stable and in no acute distress.  She is safe for discharge home with outpatient follow-up at this time.  Final Clinical Impressions(s) / ED Diagnoses   Final diagnoses:  Fall, sequela  Injury of right thumb, initial encounter    ED Discharge Orders    None       Joanne Gavel, PA-C 08/27/18 1617    Charlesetta Shanks, MD 08/27/18 1629

## 2018-08-31 ENCOUNTER — Ambulatory Visit: Payer: Medicaid Other

## 2018-09-03 ENCOUNTER — Telehealth: Payer: Self-pay | Admitting: *Deleted

## 2018-09-03 ENCOUNTER — Other Ambulatory Visit: Payer: Self-pay | Admitting: Internal Medicine

## 2018-09-03 NOTE — Telephone Encounter (Signed)
Received fax from [facility] requesting refill on olaptadine 0.1% eye drops. No longer on current med list. Please advise. Hubbard Hartshorn, RN, BSN

## 2018-09-04 NOTE — Telephone Encounter (Signed)
From review of records patient takes this medication as need for allergic eye symptoms. The prescription was changes to a different formulation/brand earlier this year due to insurance coverage preferences. The medication was removed from he list at a recent ED visit, I suspect this was because she does not take the medication all the time and may have reported it as not taking when asked by the pharmacy tech. I have added the formulation she is taking back to her med list and will refill as requested by the patient. No need to refill based on this pharmacy request.  Pearson Grippe, PGY-2

## 2018-09-04 NOTE — Telephone Encounter (Signed)
From review of records patient takes this medication as need for allergic eye symptoms. The prescription was changes to a different formulation/brand earlier this year due to insurance coverage preferences. The medication was removed from he list at a recent ED visit, I suspect this was because she does not take the medication all the time and may have reported it as not taking when asked by the pharmacy tech. I have added the formulation she is taking back to her med list and will refill as requested by the patient. No need to refill based on this pharmacy request.  Pearson Grippe, PGY-2  This pharmacy request refused

## 2018-09-10 ENCOUNTER — Ambulatory Visit: Payer: Medicaid Other

## 2018-09-11 ENCOUNTER — Ambulatory Visit (INDEPENDENT_AMBULATORY_CARE_PROVIDER_SITE_OTHER): Payer: Medicaid Other | Admitting: Pharmacist

## 2018-09-11 DIAGNOSIS — Z7901 Long term (current) use of anticoagulants: Secondary | ICD-10-CM

## 2018-09-11 DIAGNOSIS — D6859 Other primary thrombophilia: Secondary | ICD-10-CM | POA: Diagnosis not present

## 2018-09-11 DIAGNOSIS — Z5181 Encounter for therapeutic drug level monitoring: Secondary | ICD-10-CM | POA: Diagnosis not present

## 2018-09-11 LAB — POCT INR: INR: 3.2 — AB (ref 2.0–3.0)

## 2018-09-11 NOTE — Progress Notes (Signed)
Anticoagulation Management Michele Gillespie is a 42 y.o. female who reports to the clinic for monitoring of warfarin treatment.    Indication: Hypercoagulable state, Primary, History of VTE, Long term current use of anticoagulant.    Duration: indefinite Supervising physician: [facility] Visit History: Patient does not report signs/symptoms of bleeding or thromboembolism  Other recent changes: No diet, medications, lifestyle changes.  Anticoagulation Episode Summary    Current INR goal:   2.0-3.0  TTR:   48.5 % (6.3 y)  Next INR check:   08/31/2018  INR from last check:   3.2! (09/11/2018)  Weekly max warfarin dose:     Target end date:   Indefinite  INR check location:   Anticoagulation Clinic  Preferred lab:     Send INR reminders to:      Indications   HYPERCOAGULABLE STATE PRIMARY [D68.59] Long term current use of anticoagulant [Z79.01]       Comments:           No Known Allergies Prior to Admission medications   Medication Sig Start Date End Date Taking? Authorizing Provider  acetaminophen (TYLENOL) 325 MG tablet Take 650 mg by mouth every 6 (six) hours as needed for mild pain.   Yes [provider]  albuterol (PROVENTIL HFA) 108 (90 Base) MCG/ACT inhaler INHALE 2 PUFFS INTO THE LUNGS EVERY 6 HOURS AS NEEDED FOR WHEEZING. FOR SHORTNESS OF BREATH 12/11/16  Yes Ophelia Shoulder, MD  amLODipine (NORVASC) 5 MG tablet Take 1 tablet (5 mg total) by mouth daily. 04/09/18 04/09/19 Yes Neva Seat, MD  ARIPiprazole (ABILIFY) 10 MG tablet Take 1 tablet (10 mg total) by mouth daily. 03/06/18  Yes Colbert Ewing, MD  Olopatadine HCl 0.2 % SOLN Apply 1 drop to eye daily as needed.   Yes [provider]  omeprazole (PRILOSEC) 20 MG capsule TAKE 1 CAPSULE BY MOUTH EVERY DAY 08/26/18  Yes Neva Seat, MD  warfarin (COUMADIN) 5 MG tablet Take 2&1/2 tablets daily, except on Tuesdays and Fridays--take 3 tablets on Tuesdays and Fridays.  07/24/18  Yes Pennie Banter, RPH-CPP   Past Medical History:  Diagnosis Date  . Anemia 2007    microcytic anemia, baseline hemoglobin 10-11, MCV at baseline 72-77, secondary to iron deficiency  . Arm DVT (deep venous thromboembolism), acute [facility])  September 23, 2009, January 05, 2010    Doppler study significant with indeterminant age DVT involving the left upper extremity, Doppler performed January 05, 2010 consistent with acute DVT involving the left upper extremity  . Asthma   . Depression   . Hypertension   . Leukopenia 2008    unclear etiology baseline WBC  2.8-3.7  . Lung nodule  June 10, 2008    stable tiny noduke noted along the minor fissure of the right lung on CT angio September 11, 09 -  stable for 2 years and consistent with benign disease  . Pulmonary embolism [facility])  September 07, 2005    her CT angiogram - positive for pulmonary emboli to several branches of the right lower lobe- relatively small clot burden, clear lung; patient started on Coumadin; CT angiogram on January 10, 2006 showed resolution of previously seen pulmonary emboli with minimal basilar atelectasis  . Schizophrenia [facility])    Social History   Socioeconomic History  . Marital status: Married    Spouse name: Not on file  . Number of children: 5  . Years of education: In school  . Highest education level: Not on  file  Occupational History  . Occupation: Disabled  Social Needs  . Financial resource strain: Not on file  . Food insecurity:    Worry: Not on file    Inability: Not on file  . Transportation needs:    Medical: Not on file    Non-medical: Not on file  Tobacco Use  . Smoking status: Former Smoker    Packs/day: 0.25    Years: 30.00    Pack years: 7.50    Types: Cigarettes  . Smokeless tobacco: Never Used  . Tobacco comment: Quit x 3 months. Restarted 3 weeks ago.   Substance and Sexual Activity  . Alcohol use: Never    Alcohol/week: 0.0 standard drinks    Frequency: Never    Comment:  Quit x 4 yrs.  . Drug use: Not Currently    Types: Marijuana  . Sexual activity: Yes    Birth control/protection: None    Comment: tubal  Lifestyle  . Physical activity:    Days per week: Not on file    Minutes per session: Not on file  . Stress: Not on file  Relationships  . Social connections:    Talks on phone: Not on file    Gets together: Not on file    Attends religious service: Not on file    Active member of club or organization: Not on file    Attends meetings of clubs or organizations: Not on file    Relationship status: Not on file  Other Topics Concern  . Not on file  Social History Narrative    Works as a Quarry manager, 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, has used cocaine in the past, history of multiple incarcerations last one in November 2011   Caffeine [facility]:NOBS    Lives alone    Right handed    Family History  Problem Relation Age of Onset  . Hypertension Mother   . Congestive Heart Failure Mother   . Diabetes Father   . Birth defects Maternal Aunt   . Birth defects Maternal Uncle   . Diabetes Paternal Grandmother   . Breast cancer Sister   . Breast cancer Sister     ASSESSMENT Recent Results: The most recent result is correlated with 90 mg per week: Lab Results  Component Value Date   INR 3.2 (A) 09/11/2018   INR 3.2 (A) 07/31/2018   INR 2.0 06/30/2018    Anticoagulation Dosing: Description   Patient instructed to take 2 and 1/2 of her 5mg  peach-colored warfarin tablets by mouth, once-daily at 6PM. Return to the Emergency Department for any symptoms or signs of bleeding.      INR today: Supratherapeutic  PLAN Weekly dose was decreased by 3% to 87.5 mg per week  Patient Instructions  Patient instructed to take medications as defined in the Anti-coagulation Track section of this encounter.  Patient instructed to take today's dose.  Patient instructed to take 2 and 1/2 of her 5mg  peach-colored warfarin tablets by mouth, once-daily at [facility].  Return to the Emergency Department for any symptoms or signs of bleeding.  Patient verbalized understanding of these instructions.     Patient advised to contact clinic or seek medical attention if signs/symptoms of bleeding or thromboembolism occur.  Patient verbalized understanding by repeating back information and was advised to contact me if further medication-related questions arise. Patient was also provided an information handout.  Follow-up Return in 24 days (on 10/05/2018) for Follow up INR at 4:30PM.  [name]  Bufford Helms, PharmD, CPP  15 minutes spent face-to-face with the patient during the encounter. 50% of time spent on education. 50% of time was spent on fingerstick point of care INR sample collection, processing, results determination, dose adjustment and documentation in http://www.kim.[email or web address]/.

## 2018-09-11 NOTE — Patient Instructions (Signed)
Patient instructed to take medications as defined in the Anti-coagulation Track section of this encounter.  Patient instructed to take today's dose.  Patient instructed to take 2 and 1/2 of her 5mg  peach-colored warfarin tablets by mouth, once-daily at [facility]. Return to the Emergency Department for any symptoms or signs of bleeding.  Patient verbalized understanding of these instructions.

## 2018-09-14 ENCOUNTER — Other Ambulatory Visit: Payer: Self-pay | Admitting: Internal Medicine

## 2018-09-14 DIAGNOSIS — J301 Allergic rhinitis due to pollen: Secondary | ICD-10-CM

## 2018-09-14 MED ORDER — OLOPATADINE HCL 0.2 % OP SOLN: 1.0000 [drp] | Freq: Every day | OPHTHALMIC | 2 refills | Status: DC | PRN

## 2018-09-14 NOTE — Progress Notes (Signed)
Provided refill for eye drops as previously requested.

## 2018-09-14 NOTE — Addendum Note (Signed)
Addended by: Neva Seat B on: 09/14/2018 01:22 PM   Modules accepted: Orders

## 2018-09-15 ENCOUNTER — Ambulatory Visit: Payer: Medicaid Other | Admitting: Neurology

## 2018-09-15 ENCOUNTER — Encounter: Payer: Self-pay | Admitting: Neurology

## 2018-09-15 VITALS — BP 116/84 | HR 96 | Ht 64.0 in | Wt 239.0 lb

## 2018-09-15 DIAGNOSIS — R413 Other amnesia: Secondary | ICD-10-CM | POA: Diagnosis not present

## 2018-09-15 DIAGNOSIS — G479 Sleep disorder, unspecified: Secondary | ICD-10-CM

## 2018-09-15 MED ORDER — GABAPENTIN 100 MG PO CAPS: 100.0000 mg | ORAL_CAPSULE | Freq: Three times a day (TID) | ORAL | 3 refills | Status: DC

## 2018-09-15 NOTE — Progress Notes (Signed)
Reason for visit: Headache, fatigue, memory problem  Michele Gillespie is an 42 y.o. female  History of present illness:  Michele Gillespie is a 42 year old right-handed black female with a history of reported memory problems.  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  The patient recently was placed on medication for possible lupus.  The patient returns with ongoing issues with memory, she never had her neuropsychological evaluation.  The patient reports that she is fatigued all the time, she will sleep for 8 hours but wakes up frequently during that period of time and she is tired when she gets up out of bed.  The patient has bad headaches coming out of sleep.  She does snore at night.  She returns for an evaluation.  The patient also reports that she is falling relatively frequently, she did fall in November 2019 and hurt her right thumb.  Past Medical History:  Diagnosis Date  . Anemia 2007    microcytic anemia, baseline hemoglobin 10-11, MCV at baseline 72-77, secondary to iron deficiency  . Arm DVT (deep venous thromboembolism), acute [facility])  September 23, 2009, January 05, 2010    Doppler study significant with indeterminant age DVT involving the left upper extremity, Doppler performed January 05, 2010 consistent with acute DVT involving the left upper extremity  . Asthma   . Depression   . Hypertension   . Leukopenia 2008    unclear etiology baseline WBC  2.8-3.7  . Lung nodule  June 10, 2008    stable tiny noduke noted along the minor fissure of the right lung on CT angio September 11, 09 -  stable for 2 years and consistent with benign disease  . Pulmonary embolism [facility])  September 07, 2005    her CT angiogram - positive for pulmonary emboli to several branches of the right lower lobe- relatively small clot burden, clear lung; patient started on Coumadin; CT angiogram on January 10, 2006 showed resolution of previously seen pulmonary emboli  with minimal basilar atelectasis  . Schizophrenia [facility])     Past Surgical History:  Procedure Laterality Date  . CESAREAN SECTION      History of 5 C-section  . EXPLORATORY LAPAROTOMY WITH ABDOMINAL MASS EXCISION  02/2005  . TUBAL LIGATION      Family History  Problem Relation Age of Onset  . Hypertension Mother   . Congestive Heart Failure Mother   . Diabetes Father   . Birth defects Maternal Aunt   . Birth defects Maternal Uncle   . Diabetes Paternal Grandmother   . Breast cancer Sister   . Breast cancer Sister     Social history:  reports that she has quit smoking. Her smoking use included cigarettes. She has a 7.50 pack-year smoking history. She has never used smokeless tobacco. She reports previous drug use. Drug: Marijuana. She reports that she does not drink alcohol.   No Known Allergies  Medications:  Prior to Admission medications   Medication Sig Start Date End Date Taking? Authorizing Provider  acetaminophen (TYLENOL) 325 MG tablet Take 650 mg by mouth every 6 (six) hours as needed for mild pain.   Yes [provider]  albuterol (PROVENTIL HFA) 108 (90 Base) MCG/ACT inhaler INHALE 2 PUFFS INTO THE LUNGS EVERY 6 HOURS AS NEEDED FOR WHEEZING. FOR SHORTNESS OF BREATH 12/11/16  Yes Ophelia Shoulder, MD  amLODipine (NORVASC) 5 MG tablet Take 1 tablet (5 mg total) by mouth  daily. 04/09/18 04/09/19 Yes Neva Seat, MD  ARIPiprazole (ABILIFY) 10 MG tablet Take 1 tablet (10 mg total) by mouth daily. 03/06/18  Yes Colbert Ewing, MD  hydroxychloroquine (PLAQUENIL) 200 MG tablet Take by mouth. 09/07/18  Yes [provider]  Olopatadine HCl 0.2 % SOLN Apply 1 drop to eye daily as needed. 09/14/18  Yes Neva Seat, MD  omeprazole (PRILOSEC) 20 MG capsule TAKE 1 CAPSULE BY MOUTH EVERY DAY 08/26/18  Yes Neva Seat, MD  UNABLE TO FIND Eye cream 2 daily to help with dark circles around eyes.   Yes [provider]  warfarin (COUMADIN) 5 MG tablet  Take 2&1/2 tablets daily, except on Tuesdays and Fridays--take 3 tablets on Tuesdays and Fridays. 07/24/18  Yes Pennie Banter, RPH-CPP    ROS:  Out of a complete 14 system review of symptoms, the patient complains only of the following symptoms, and all other reviewed systems are negative.  Decreased appetite Eye itching Excessive thirst, excessive eating Constipation Frequent waking, snoring Frequency of urination, blood in the urine Joint pain, aching muscles, muscle cramps Anemia, bruising easily Headache, weakness Depression  Blood pressure 116/84, pulse 96, height 5\' 4"  (1.626 m), weight 239 lb (108.4 kg).  Physical Exam  General: The patient is alert and cooperative at the time of the examination.  Skin: No significant peripheral edema is noted.   Neurologic Exam  Mental status: The patient is alert and oriented x 3 at the time of the examination. The patient has apparent normal recent and remote memory, with an apparently normal attention span and concentration ability.  Mini-Mental status examination done today shows a total score 30/30.   Cranial nerves: Facial symmetry is present. Speech is normal, no aphasia or dysarthria is noted. Extraocular movements are full. Visual fields are full.  Motor: The patient has good strength in all 4 extremities.  Sensory examination: Soft touch sensation is symmetric on the face, arms, and legs.  Coordination: The patient has good finger-nose-finger and heel-to-shin bilaterally.  Gait and station: The patient has a normal gait. Tandem gait is normal. Romberg is negative. No drift is seen.  Reflexes: Deep tendon reflexes are symmetric.   MRI brain 05/08/18:  IMPRESSION: This MRI of the brain without contrast shows the following: 1. Few scattered punctate T2/FLAIR hypertense foci in the subcortical white matter of the hemispheres. This likely represents minimal chronic microvascular ischemic change. None of these appear  to be acute. 2. Brain volume is normal 3. There are no acute findings.  * MRI scan images were reviewed online. I agree with the written report.    Assessment/Plan:  1.  Reported memory disturbance  2.  Chronic daily headache  3.  Fatigue, excessive daytime drowsiness  4.  History of schizophrenia  The patient will be set up again for neuropsychological evaluation.  She will be sent for a sleep evaluation to rule out sleep apnea.  The patient will be placed on low-dose gabapentin for the headache taking 100 mg 3 times daily.  She will call for any dose adjustments.  She will follow-up in about 3 or 4 months.  Jill Alexanders MD 09/15/2018 3:55 PM  [facility] [address] [place],  [address]  Phone [phone] Fax [phone]

## 2018-09-15 NOTE — Patient Instructions (Signed)
We will start gabapentin for the headache.  Neurontin (gabapentin) may result in drowsiness, ankle swelling, gait instability, or possibly dizziness. Please contact our office if significant side effects occur with this medication.  

## 2018-09-28 ENCOUNTER — Ambulatory Visit: Payer: Medicaid Other | Admitting: Pharmacist

## 2018-09-28 ENCOUNTER — Encounter: Payer: Self-pay | Admitting: Pharmacist

## 2018-09-28 DIAGNOSIS — Z5181 Encounter for therapeutic drug level monitoring: Secondary | ICD-10-CM | POA: Diagnosis not present

## 2018-09-28 DIAGNOSIS — D6859 Other primary thrombophilia: Secondary | ICD-10-CM

## 2018-09-28 DIAGNOSIS — Z7901 Long term (current) use of anticoagulants: Secondary | ICD-10-CM | POA: Diagnosis not present

## 2018-09-28 LAB — POCT INR: INR: 2.4 (ref 2.0–3.0)

## 2018-09-28 NOTE — Patient Instructions (Signed)
Patient instructed to take medications as defined in the Anti-coagulation Track section of this encounter.  Patient instructed to take today's dose.  Patient instructed to take 2 and 1/2 of her 5mg  peach-colored warfarin tablets by mouth, once-daily at [facility]. Return to the Emergency Department for any symptoms or signs of bleeding.  Patient verbalized understanding of these instructions.

## 2018-09-28 NOTE — Progress Notes (Signed)
Anticoagulation Management Michele Gillespie is a 42 y.o. female who reports to the clinic for monitoring of warfarin treatment.    Indication: Hypercoagulable state, primary [D68.59], Long term current use of anticoagulant [Z79.01].   Duration: indefinite Supervising physician: Joni Reining  Anticoagulation Clinic Visit History: Patient does not report signs/symptoms of bleeding or thromboembolism  Other recent changes: No diet, medications, lifestyle changes reported.  Anticoagulation Episode Summary    Current INR goal:   2.0-3.0  TTR:   48.7 % (6.3 y)  Next INR check:   10/26/2018  INR from last check:   2.4 (09/28/2018)  Weekly max warfarin dose:     Target end date:   Indefinite  INR check location:   Anticoagulation Clinic  Preferred lab:     Send INR reminders to:      Indications   HYPERCOAGULABLE STATE PRIMARY [D68.59] Long term current use of anticoagulant [Z79.01]       Comments:           No Known Allergies Prior to Admission medications   Medication Sig Start Date End Date Taking? Authorizing Provider  acetaminophen (TYLENOL) 325 MG tablet Take 650 mg by mouth every 6 (six) hours as needed for mild pain.   Yes [provider]  albuterol (PROVENTIL HFA) 108 (90 Base) MCG/ACT inhaler INHALE 2 PUFFS INTO THE LUNGS EVERY 6 HOURS AS NEEDED FOR WHEEZING. FOR SHORTNESS OF BREATH 12/11/16  Yes Ophelia Shoulder, MD  amLODipine (NORVASC) 5 MG tablet Take 1 tablet (5 mg total) by mouth daily. 04/09/18 04/09/19 Yes Neva Seat, MD  ARIPiprazole (ABILIFY) 10 MG tablet Take 1 tablet (10 mg total) by mouth daily. 03/06/18  Yes Colbert Ewing, MD  gabapentin (NEURONTIN) 100 MG capsule Take 1 capsule (100 mg total) by mouth 3 (three) times daily. 09/15/18  Yes Kathrynn Ducking, MD  hydroxychloroquine (PLAQUENIL) 200 MG tablet Take by mouth. 09/07/18  Yes [provider]  Olopatadine HCl 0.2 % SOLN Apply 1 drop to eye daily as needed. 09/14/18  Yes Neva Seat, MD  omeprazole (PRILOSEC) 20 MG capsule TAKE 1 CAPSULE BY MOUTH EVERY DAY 08/26/18  Yes Neva Seat, MD  UNABLE TO FIND Eye cream 2 daily to help with dark circles around eyes.   Yes [provider]  warfarin (COUMADIN) 5 MG tablet Take 2&1/2 tablets daily, except on Tuesdays and Fridays--take 3 tablets on Tuesdays and Fridays. 07/24/18  Yes Pennie Banter, RPH-CPP   Past Medical History:  Diagnosis Date  . Anemia 2007    microcytic anemia, baseline hemoglobin 10-11, MCV at baseline 72-77, secondary to iron deficiency  . Arm DVT (deep venous thromboembolism), acute [facility])  September 23, 2009, January 05, 2010    Doppler study significant with indeterminant age DVT involving the left upper extremity, Doppler performed January 05, 2010 consistent with acute DVT involving the left upper extremity  . Asthma   . Depression   . Hypertension   . Leukopenia 2008    unclear etiology baseline WBC  2.8-3.7  . Lung nodule  June 10, 2008    stable tiny noduke noted along the minor fissure of the right lung on CT angio September 11, 09 -  stable for 2 years and consistent with benign disease  . Pulmonary embolism [facility])  September 07, 2005    her CT angiogram - positive for pulmonary emboli to several branches of the right lower lobe- relatively small clot burden, clear lung; patient started on Coumadin; CT angiogram on  January 10, 2006 showed resolution of previously seen pulmonary emboli with minimal basilar atelectasis  . Schizophrenia [facility])    Social History   Socioeconomic History  . Marital status: Married    Spouse name: Not on file  . Number of children: 5  . Years of education: In school  . Highest education level: Not on file  Occupational History  . Occupation: Disabled  Social Needs  . Financial resource strain: Not on file  . Food insecurity:    Worry: Not on file    Inability: Not on file  . Transportation needs:    Medical: Not on file    Non-medical: Not on  file  Tobacco Use  . Smoking status: Former Smoker    Packs/day: 0.25    Years: 30.00    Pack years: 7.50    Types: Cigarettes  . Smokeless tobacco: Never Used  . Tobacco comment: Quit x 3 months. Restarted 3 weeks ago.   Substance and Sexual Activity  . Alcohol use: Never    Alcohol/week: 0.0 standard drinks    Frequency: Never    Comment: Quit x 4 yrs.  . Drug use: Not Currently    Types: Marijuana  . Sexual activity: Yes    Birth control/protection: None    Comment: tubal  Lifestyle  . Physical activity:    Days per week: Not on file    Minutes per session: Not on file  . Stress: Not on file  Relationships  . Social connections:    Talks on phone: Not on file    Gets together: Not on file    Attends religious service: Not on file    Active member of club or organization: Not on file    Attends meetings of clubs or organizations: Not on file    Relationship status: Not on file  Other Topics Concern  . 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 Hypertension Mother   . Congestive Heart Failure Mother   . Diabetes Father   . Birth defects Maternal Aunt   . Birth defects Maternal Uncle   . Diabetes Paternal Grandmother   . Breast cancer Sister   . Breast cancer Sister     ASSESSMENT Recent Results: The most recent result is correlated with 87.5 mg per week: Lab Results  Component Value Date   INR 2.4 09/28/2018   INR 3.2 (A) 09/11/2018   INR 3.2 (A) 07/31/2018    Anticoagulation Dosing: Description   Patient instructed to take 2 and 1/2 of her 5mg  peach-colored warfarin tablets by mouth, once-daily at 6PM. Return to the Emergency Department for any symptoms or signs of bleeding.      INR today:  Therapeutic  PLAN Weekly dose was unchanged.    Patient Instructions  Patient instructed to take medications as defined in the Anti-coagulation Track section of this encounter.  Patient instructed to take today's dose.  Patient instructed to take 2 and 1/2 of her 5mg  peach-colored warfarin tablets by mouth, once-daily at [facility]. Return to the Emergency Department for any symptoms or signs of bleeding.  Patient verbalized understanding of these instructions.     Patient advised to contact  clinic or seek medical attention if signs/symptoms of bleeding or thromboembolism occur.  Patient verbalized understanding by repeating back information and was advised to contact me if further medication-related questions arise. Patient was also provided an information handout.  Follow-up Return in 4 weeks (on 10/26/2018) for Follow up INR at 2:15PM.  Pennie Banter, PharmD, CPP  15 minutes spent face-to-face with the patient during the encounter. 50% of time spent on education. 50% of time was spent on fingerstick point of care INR sample collection, processing, results determination, and documentation in http://www.kim.net/.

## 2018-10-17 ENCOUNTER — Other Ambulatory Visit: Payer: Self-pay | Admitting: Pharmacist

## 2018-10-17 DIAGNOSIS — Z7901 Long term (current) use of anticoagulants: Secondary | ICD-10-CM

## 2018-10-17 DIAGNOSIS — D6859 Other primary thrombophilia: Secondary | ICD-10-CM

## 2018-10-17 MED ORDER — WARFARIN SODIUM 5 MG PO TABS: 12.5000 mg | ORAL_TABLET | Freq: Every day | ORAL | 2 refills | Status: DC

## 2018-10-26 ENCOUNTER — Encounter: Payer: Self-pay | Admitting: Pharmacist

## 2018-10-26 ENCOUNTER — Encounter (INDEPENDENT_AMBULATORY_CARE_PROVIDER_SITE_OTHER): Payer: Self-pay

## 2018-10-26 ENCOUNTER — Ambulatory Visit: Payer: Medicaid Other | Admitting: Pharmacist

## 2018-10-26 DIAGNOSIS — D6859 Other primary thrombophilia: Secondary | ICD-10-CM | POA: Diagnosis present

## 2018-10-26 DIAGNOSIS — Z5181 Encounter for therapeutic drug level monitoring: Secondary | ICD-10-CM | POA: Diagnosis not present

## 2018-10-26 DIAGNOSIS — Z7901 Long term (current) use of anticoagulants: Secondary | ICD-10-CM | POA: Diagnosis not present

## 2018-10-26 LAB — POCT INR: INR: 1.8 — AB (ref 2.0–3.0)

## 2018-10-26 NOTE — Patient Instructions (Signed)
Patient instructed to take medications as defined in the Anti-coagulation Track section of this encounter.  Patient instructed to take today's dose.  Patient instructed to take  2 and 1/2 of her 5mg  peach-colored warfarin tablets by mouth, once-daily at 6PM--EXCEPT on MONDAYS and THURSDAYS, take three (3) tablets on Mondays and Thursdays. Return to the Emergency Department for any symptoms or signs of bleeding.  Patient verbalized understanding of these instructions.

## 2018-10-26 NOTE — Progress Notes (Signed)
Anticoagulation Management Michele Gillespie is a 43 y.o. female who reports to the clinic for monitoring of warfarin treatment.    Indication: Hypercoagulable state, primary; Long term current use of anticoagulant.    Duration: indefinite Supervising physician: Aldine Contes  Anticoagulation Clinic Visit History: Patient does not report signs/symptoms of bleeding or thromboembolism  Other recent changes: No diet, medications, lifestyle changes.  Anticoagulation Episode Summary    Current INR goal:   2.0-3.0  TTR:   48.9 % (6.4 y)  Next INR check:   11/23/2018  INR from last check:   1.8! (10/26/2018)  Weekly max warfarin dose:     Target end date:   Indefinite  INR check location:   Anticoagulation Clinic  Preferred lab:     Send INR reminders to:      Indications   HYPERCOAGULABLE STATE PRIMARY [D68.59] Long term current use of anticoagulant [Z79.01]       Comments:           No Known Allergies Prior to Admission medications   Medication Sig Start Date End Date Taking? Authorizing Provider  acetaminophen (TYLENOL) 325 MG tablet Take 650 mg by mouth every 6 (six) hours as needed for mild pain.   Yes [provider]  albuterol (PROVENTIL HFA) 108 (90 Base) MCG/ACT inhaler INHALE 2 PUFFS INTO THE LUNGS EVERY 6 HOURS AS NEEDED FOR WHEEZING. FOR SHORTNESS OF BREATH 12/11/16  Yes Ophelia Shoulder, MD  amLODipine (NORVASC) 5 MG tablet Take 1 tablet (5 mg total) by mouth daily. 04/09/18 04/09/19 Yes Neva Seat, MD  ARIPiprazole (ABILIFY) 10 MG tablet Take 1 tablet (10 mg total) by mouth daily. 03/06/18  Yes Colbert Ewing, MD  gabapentin (NEURONTIN) 100 MG capsule Take 1 capsule (100 mg total) by mouth 3 (three) times daily. 09/15/18  Yes Kathrynn Ducking, MD  hydroxychloroquine (PLAQUENIL) 200 MG tablet Take by mouth. 09/07/18  Yes [provider]  Olopatadine HCl 0.2 % SOLN Apply 1 drop to eye daily as needed. 09/14/18  Yes Neva Seat, MD  omeprazole  (PRILOSEC) 20 MG capsule TAKE 1 CAPSULE BY MOUTH EVERY DAY 08/26/18  Yes Neva Seat, MD  UNABLE TO FIND Eye cream 2 daily to help with dark circles around eyes.   Yes [provider]  warfarin (COUMADIN) 5 MG tablet Take 2.5 tablets (12.5 mg total) by mouth daily at 6 PM. 10/17/18  Yes Pennie Banter, RPH-CPP   Past Medical History:  Diagnosis Date  . Anemia 2007    microcytic anemia, baseline hemoglobin 10-11, MCV at baseline 72-77, secondary to iron deficiency  . Arm DVT (deep venous thromboembolism), acute [facility])  September 23, 2009, January 05, 2010    Doppler study significant with indeterminant age DVT involving the left upper extremity, Doppler performed January 05, 2010 consistent with acute DVT involving the left upper extremity  . Asthma   . Depression   . Hypertension   . Leukopenia 2008    unclear etiology baseline WBC  2.8-3.7  . Lung nodule  June 10, 2008    stable tiny noduke noted along the minor fissure of the right lung on CT angio September 11, 09 -  stable for 2 years and consistent with benign disease  . Pulmonary embolism [facility])  September 07, 2005    her CT angiogram - positive for pulmonary emboli to several branches of the right lower lobe- relatively small clot burden, clear lung; patient started on Coumadin; CT angiogram on January 10, 2006 showed resolution  of previously seen pulmonary emboli with minimal basilar atelectasis  . Schizophrenia [facility])    Social History   Socioeconomic History  . Marital status: Married    Spouse name: Not on file  . Number of children: 5  . Years of education: In school  . Highest education level: Not on file  Occupational History  . Occupation: Disabled  Social Needs  . Financial resource strain: Not on file  . Food insecurity:    Worry: Not on file    Inability: Not on file  . Transportation needs:    Medical: Not on file    Non-medical: Not on file  Tobacco Use  . Smoking status: Former Smoker    Packs/day:  0.25    Years: 30.00    Pack years: 7.50    Types: Cigarettes  . Smokeless tobacco: Never Used  . Tobacco comment: Quit x 3 months. Restarted 3 weeks ago.   Substance and Sexual Activity  . Alcohol use: Never    Alcohol/week: 0.0 standard drinks    Frequency: Never    Comment: Quit x 4 yrs.  . Drug use: Not Currently    Types: Marijuana  . Sexual activity: Yes    Birth control/protection: None    Comment: tubal  Lifestyle  . Physical activity:    Days per week: Not on file    Minutes per session: Not on file  . Stress: Not on file  Relationships  . Social connections:    Talks on phone: Not on file    Gets together: Not on file    Attends religious service: Not on file    Active member of club or organization: Not on file    Attends meetings of clubs or organizations: Not on file    Relationship status: Not on file  Other Topics Concern  . 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 Hypertension Mother   . Congestive Heart Failure Mother   . Diabetes Father   . Birth defects Maternal Aunt   . Birth defects Maternal Uncle   . Diabetes Paternal Grandmother   . Breast cancer Sister   . Breast cancer Sister     ASSESSMENT Recent Results: The most recent result is correlated with 87.5 mg per week: Lab Results  Component Value Date   INR 1.8 (A) 10/26/2018   INR 2.4 09/28/2018   INR 3.2 (A) 09/11/2018    Anticoagulation Dosing: Description   Patient instructed to take 2 and 1/2 of her 5mg  peach-colored warfarin tablets by mouth, once-daily at 6PM--EXCEPT on MONDAYS and THURSDAYS, take three (3) tablets on Mondays and Thursdays. Return to the Emergency Department for any symptoms or signs of bleeding.      INR  today: Subtherapeutic  PLAN Weekly dose was increased by 6% to 92.5 mg per week  Patient Instructions  Patient instructed to take medications as defined in the Anti-coagulation Track section of this encounter.  Patient instructed to take today's dose.  Patient instructed to take  2 and 1/2 of her 5mg  peach-colored warfarin tablets by mouth, once-daily at 6PM--EXCEPT on MONDAYS and THURSDAYS, take three (3) tablets on Mondays and Thursdays. Return to  the Emergency Department for any symptoms or signs of bleeding.  Patient verbalized understanding of these instructions.     Patient advised to contact clinic or seek medical attention if signs/symptoms of bleeding or thromboembolism occur.  Patient verbalized understanding by repeating back information and was advised to contact me if further medication-related questions arise. Patient was also provided an information handout.  Follow-up Return in 4 weeks (on 11/23/2018) for Follow up INR at [place], PharmD, CPP  15 minutes spent face-to-face with the patient during the encounter. 50% of time spent on education. 50% of time was spent on fingerstick point of care INR sample collection, processing, results determination, dose adjustment and documentation in http://www.kim.net/.

## 2018-10-28 NOTE — Progress Notes (Signed)
INTERNAL MEDICINE TEACHING ATTENDING ADDENDUM - Ithiel Liebler M.D  Duration- indefinite, Indication- recurrent VTE, INR- sub therapeutic. Agree with pharmacy recommendations as outlined in their note.     

## 2018-11-04 ENCOUNTER — Other Ambulatory Visit: Payer: Self-pay | Admitting: Neurology

## 2018-11-04 DIAGNOSIS — R413 Other amnesia: Secondary | ICD-10-CM

## 2018-11-04 NOTE — Progress Notes (Signed)
The neuropsychological evaluation was requested back in December 2019, apparently went to the wrong work Q, I will resend the order.

## 2018-11-09 ENCOUNTER — Encounter: Payer: Self-pay | Admitting: Neurology

## 2018-11-09 ENCOUNTER — Ambulatory Visit: Payer: Medicaid Other | Admitting: Neurology

## 2018-11-09 VITALS — BP 144/98 | HR 87 | Ht 64.5 in | Wt 247.0 lb

## 2018-11-09 DIAGNOSIS — F17200 Nicotine dependence, unspecified, uncomplicated: Secondary | ICD-10-CM

## 2018-11-09 DIAGNOSIS — Z6841 Body Mass Index (BMI) 40.0 and over, adult: Secondary | ICD-10-CM

## 2018-11-09 DIAGNOSIS — G4719 Other hypersomnia: Secondary | ICD-10-CM | POA: Diagnosis not present

## 2018-11-09 DIAGNOSIS — R419 Unspecified symptoms and signs involving cognitive functions and awareness: Secondary | ICD-10-CM

## 2018-11-09 DIAGNOSIS — R51 Headache: Secondary | ICD-10-CM

## 2018-11-09 DIAGNOSIS — R351 Nocturia: Secondary | ICD-10-CM

## 2018-11-09 DIAGNOSIS — R519 Headache, unspecified: Secondary | ICD-10-CM

## 2018-11-09 DIAGNOSIS — J342 Deviated nasal septum: Secondary | ICD-10-CM

## 2018-11-09 NOTE — Progress Notes (Signed)
Subjective:    Patient ID: Michele Gillespie is a 43 y.o. female.  HPI     Star Age, MD, PhD [facility] [address] [place], [place] [address]  Dear Lanny Hurst,   I saw your patient, Michele Gillespie, upon your kind request in my sleep clinic today for initial consultation of her sleep disorder, in particular, concern for underlying obstructive sleep apnea. The patient is unaccompanied today. As you know, [name] is a 43 year old right-handed woman with an underlying medical history of mood disorder, DVT, memory loss, history of pulmonary embolism, hypertension, asthma, anemia, and morbid obesity with a BMI of over 40, who reports snoring and excessive daytime somnolence as well as morning headaches. I reviewed your office note from 09/15/2018. Her Epworth sleepiness score is 16 out of 24 today, fatigue score is 54 out of 63. She is married, lives with her husband, has 5 children. She works for [employer] in [employer], and is also a Ship broker at [employer], 2 nights out of the week, also works PT in [place], T/Th, 9 to [place], Sat 5 pm to 9 pm. She has gained quite a bit of weight in less than 6 months. She has nocturia about 2-3 times per night. She smokes about a half a pack per day, restarted about 1 year ago, attributes this to stress. She has 5 sons, one lives with them. She does not utilize alcohol does not drink caffeine on a regular basis, she does not know if there is OSA in the FHx. Her husband has noted pauses in her breathing while she is asleep. She has a TV in the bedroom, turns it off, no pets in the house. She has woken up with a HA. She has nasal congestion.   Her Past Medical History Is Significant For: Past Medical History:  Diagnosis Date  . Anemia 2007    microcytic anemia, baseline hemoglobin 10-11, MCV at baseline 72-77, secondary to iron deficiency  . Arm DVT (deep venous thromboembolism), acute [facility])  September 23, 2009, January 05, 2010    Doppler study significant with indeterminant age DVT involving the left upper extremity, Doppler performed January 05, 2010 consistent with acute DVT involving the left upper extremity  . Asthma   . Depression   . Hypertension   . Leukopenia 2008    unclear etiology baseline WBC  2.8-3.7  . Lung nodule  June 10, 2008    stable tiny noduke noted along the minor fissure of the right lung on CT angio September 11, 09 -  stable for 2 years and consistent with benign disease  . Pulmonary embolism [facility])  September 07, 2005    her CT angiogram - positive for pulmonary emboli to several branches of the right lower lobe- relatively small clot burden, clear lung; patient started on Coumadin; CT angiogram on January 10, 2006 showed resolution of previously seen pulmonary emboli with minimal basilar atelectasis  . Schizophrenia ([place])     Her Past Surgical History Is Significant For: Past Surgical History:  Procedure Laterality Date  . CESAREAN SECTION      History of 5 C-section  . EXPLORATORY LAPAROTOMY WITH ABDOMINAL MASS EXCISION  02/2005  . TUBAL LIGATION      Her Family History Is Significant For: Family History  Problem Relation Age of Onset  . Hypertension Mother   . Congestive Heart Failure Mother   . Diabetes Father   . Birth defects Maternal Aunt   .  Birth defects Maternal Uncle   . Diabetes Paternal Grandmother   . Breast cancer Sister   . Breast cancer Sister     Her Social History Is Significant For: Social History   Socioeconomic History  . Marital status: Married    Spouse name: Not on file  . Number of children: 5  . Years of education: In school  . Highest education level: Not on file  Occupational History  . Occupation: Disabled  Social Needs  . Financial resource strain: Not on file  . Food insecurity:    Worry: Not on file    Inability: Not on file  . Transportation needs:    Medical: Not on file    Non-medical: Not on file  Tobacco  Use  . Smoking status: Former Smoker    Packs/day: 0.25    Years: 30.00    Pack years: 7.50    Types: Cigarettes  . Smokeless tobacco: Never Used  . Tobacco comment: Quit x 3 months. Restarted 3 weeks ago.   Substance and Sexual Activity  . Alcohol use: Never    Alcohol/week: 0.0 standard drinks    Frequency: Never    Comment: Quit x 4 yrs.  . Drug use: Not Currently    Types: Marijuana  . Sexual activity: Yes    Birth control/protection: None    Comment: tubal  Lifestyle  . Physical activity:    Days per week: Not on file    Minutes per session: Not on file  . Stress: Not on file  Relationships  . Social connections:    Talks on phone: Not on file    Gets together: Not on file    Attends religious service: Not on file    Active member of club or organization: Not on file    Attends meetings of clubs or organizations: Not on file    Relationship status: Not on file  Other Topics Concern  . 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 acetaminophen (TYLENOL) 325 MG tablet Take 650 mg by mouth every 6 (six) hours as needed for mild pain.  Marland Kitchen albuterol (PROVENTIL HFA) 108 (90 Base) MCG/ACT inhaler INHALE 2 PUFFS INTO THE LUNGS EVERY 6 HOURS AS NEEDED FOR WHEEZING. FOR SHORTNESS OF BREATH  . amLODipine (NORVASC) 5 MG tablet Take 1 tablet (5 mg total) by mouth daily.  . ARIPiprazole (ABILIFY) 10 MG tablet Take 1 tablet (10 mg total) by mouth daily.  Marland Kitchen gabapentin (NEURONTIN) 100 MG capsule Take 1 capsule (100 mg total) by mouth 3 (three) times daily.  . hydroxychloroquine (PLAQUENIL) 200 MG tablet Take by mouth.  Marland Kitchen omeprazole  (PRILOSEC) 20 MG capsule TAKE 1 CAPSULE BY MOUTH EVERY DAY  . UNABLE TO FIND Eye cream 2 daily to help with dark circles around eyes.  Marland Kitchen warfarin (COUMADIN) 5 MG tablet Take 2.5 tablets (12.5 mg total) by mouth daily at 6 PM.  . [DISCONTINUED] Olopatadine HCl 0.2 % SOLN Apply 1 drop to eye daily as needed.   No facility-administered encounter medications on file as of 11/09/2018.   :  Review of Systems:  Out of a complete 14 point review of systems, all are reviewed and negative with the exception of these symptoms as listed below: Review of Systems  Neurological:       Pt presents today to discuss her sleep. Pt has never had a sleep study but does endorse snoring.  Epworth Sleepiness Scale 0= would never doze 1= slight chance of dozing 2= moderate chance of dozing 3= high chance of dozing  Sitting and reading: 2 Watching TV: 3 Sitting inactive in a public place (ex. Theater or meeting): 3 As a passenger in a car for an hour without a break: 3 Lying down to rest in the afternoon: 3 Sitting and talking to someone: 0 Sitting quietly after lunch (no alcohol): 2 In a car, while stopped in traffic: 0 Total: 16     Objective:  Neurological Exam  Physical Exam Physical Examination:   Vitals:   11/09/18 1538  BP: (!) 144/98  Pulse: 87    General Examination: The patient is a very pleasant 43 y.o. female in no acute distress. She appears well-developed and well-nourished and well groomed.   HEENT: Normocephalic, atraumatic, pupils are equal, round and reactive to light and accommodation. Extraocular tracking is good without limitation to gaze excursion or nystagmus noted. Normal smooth pursuit is noted. Hearing is grossly intact. Face is symmetric with normal facial animation and normal facial sensation. Speech is clear with no dysarthria noted. There is no hypophonia. There is no lip, neck/head, jaw or voice tremor. Neck is supple with full range of passive and active motion.  There are no carotid bruits on auscultation. Oropharynx exam reveals: mild mouth dryness, adequate dental hygiene and moderate airway crowding, due to smaller airway entry, tonsils in place, R about 1-2+ and L about 1+. Mallampati is class II. Tongue protrudes centrally and palate elevates symmetrically. Neck size is 15.75 inches. She has a  overbite. Nasal inspection reveals no significant nasal mucosal bogginess or redness and significant septal deviation to the L. Moderate overweight.   Chest: Clear to auscultation without wheezing, rhonchi or crackles noted.  Heart: S1+S2+0, regular and normal without murmurs, rubs or gallops noted.   Abdomen: Soft, non-tender and non-distended with normal bowel sounds appreciated on auscultation.  Extremities: There is no pitting edema in the distal lower extremities bilaterally.  Skin: Warm and dry without trophic changes noted.  Musculoskeletal: exam reveals no obvious joint deformities, tenderness or joint swelling or erythema.   Neurologically:  Mental status: The patient is awake, alert and oriented in all 4 spheres. Her immediate and remote memory, attention, language skills and fund of knowledge are appropriate. There is no evidence of aphasia, agnosia, apraxia or anomia. Speech is clear with normal prosody and enunciation. Thought process is linear. Mood is normal and affect is normal.  Cranial nerves II - XII are as described above under HEENT exam. In addition: shoulder shrug is normal with equal shoulder height noted. Motor exam: Normal bulk, strength and tone is noted. There is no drift, tremor or rebound. Romberg is negative. Fine motor skills and coordination: intact grossly.   Cerebellar testing: No dysmetria or intention tremor on finger to nose testing. Heel to shin is unremarkable bilaterally. There is no truncal or gait ataxia.  Sensory exam: intact to light touch in the upper and lower extremities.  Gait, station and balance: She stands  easily. No veering to one side is noted. No leaning to one side is noted. Posture is age-appropriate and  stance is narrow based. Gait shows normal stride length and normal pace. No problems turning are noted. Tandem walk is unremarkable.   Assessment and Plan:  In summary, SHARMIN FOULK is a very pleasant 43 y.o.-year old female with an underlying medical history of mood disorder, DVT, memory loss, history of pulmonary embolism, hypertension, asthma, anemia, and morbid obesity with a BMI of over 40, whose history and physical exam are concerning for obstructive sleep apnea (OSA). I had a long chat with the patient about my findings and the diagnosis of OSA, its prognosis and treatment options. We talked about medical treatments, surgical interventions and non-pharmacological approaches. I explained in particular the risks and ramifications of untreated moderate to severe OSA, especially with respect to developing cardiovascular disease down the Road, including congestive heart failure, difficult to treat hypertension, cardiac arrhythmias, or stroke. Even type 2 diabetes has, in part, been linked to untreated OSA. Symptoms of untreated OSA include daytime sleepiness, memory problems, mood irritability and mood disorder such as depression and anxiety, lack of energy, as well as recurrent headaches, especially morning headaches. We talked about trying to maintain a healthy lifestyle in general, as well as the importance of weight control. I encouraged the patient to eat healthy, exercise daily and keep well hydrated, to keep a scheduled bedtime and wake time routine, to not skip any meals and eat healthy snacks in between meals. I advised the patient not to drive when feeling sleepy. I recommended the following at this time: sleep study with potential positive airway pressure titration. (We will score hypopneas at 4%).   I explained the sleep test procedure to the patient and also outlined possible surgical and  non-surgical treatment options of OSA, including the use of a custom-made dental device (which would require a referral to a specialist dentist or oral surgeon), upper airway surgical options, such as pillar implants, radiofrequency surgery, tongue base surgery, and UPPP (which would involve a referral to an ENT surgeon). Rarely, jaw surgery such as mandibular advancement may be considered.  I also explained the CPAP treatment option to the patient, who indicated that she would be willing to try CPAP if the need arises. I explained the importance of being compliant with PAP treatment, not only for insurance purposes but primarily to improve Her symptoms, and for the patient's long term health benefit, including to reduce Her cardiovascular risks. I answered all her questions today and the patient was in agreement. I plan to see her back after the sleep study is completed and encouraged her to call with any interim questions, concerns, problems or updates.   Thank you very much for allowing me to participate in the care of this nice patient. If I can be of any further assistance to you please do not hesitate to talk to me.  Sincerely,   Star Age, MD, PhD

## 2018-11-09 NOTE — Patient Instructions (Signed)

## 2018-11-10 ENCOUNTER — Telehealth: Payer: Self-pay | Admitting: Internal Medicine

## 2018-11-10 NOTE — Telephone Encounter (Signed)
Requesting a callback from [facility]; pt contact [phone])

## 2018-11-11 NOTE — Telephone Encounter (Signed)
Patient was calling about son who is also a patient. This has been addressed in his chart. Hubbard Hartshorn, RN, BSN

## 2018-11-23 ENCOUNTER — Ambulatory Visit (INDEPENDENT_AMBULATORY_CARE_PROVIDER_SITE_OTHER): Payer: Medicaid Other | Admitting: Pharmacist

## 2018-11-23 DIAGNOSIS — Z5181 Encounter for therapeutic drug level monitoring: Secondary | ICD-10-CM

## 2018-11-23 DIAGNOSIS — Z7901 Long term (current) use of anticoagulants: Secondary | ICD-10-CM

## 2018-11-23 DIAGNOSIS — D6859 Other primary thrombophilia: Secondary | ICD-10-CM

## 2018-11-23 LAB — POCT INR: INR: 3.4 — AB (ref 2.0–3.0)

## 2018-11-23 NOTE — Patient Instructions (Signed)
Patient educated about medication as defined in this encounter and verbalized understanding by repeating back instructions provided.   

## 2018-11-23 NOTE — Progress Notes (Signed)
Anticoagulation Management Michele Gillespie is a 43 y.o. female who reports to the clinic for monitoring of warfarin treatment.    Indication: Hypercoagulable state, primary; Long term current use of anticoagulant.   Duration: indefinite Supervising physician: Joni Reining  Anticoagulation Clinic Visit History: Patient does not report signs/symptoms of bleeding or thromboembolism  Other recent changes: No diet, medications, lifestyle changes.   Anticoagulation Episode Summary    Current INR goal:   2.0-3.0  TTR:   49.1 % (6.5 y)  Next INR check:   12/07/2018  INR from last check:   3.4! (11/23/2018)  Weekly max warfarin dose:     Target end date:   Indefinite  INR check location:   Anticoagulation Clinic  Preferred lab:     Send INR reminders to:      Indications   HYPERCOAGULABLE STATE PRIMARY [D68.59] Long term current use of anticoagulant [Z79.01]       Comments:          No Known Allergies Medication Sig  acetaminophen (TYLENOL) 325 MG tablet Take 650 mg by mouth every 6 (six) hours as needed for mild pain.  albuterol (PROVENTIL HFA) 108 (90 Base) MCG/ACT inhaler INHALE 2 PUFFS INTO THE LUNGS EVERY 6 HOURS AS NEEDED FOR WHEEZING. FOR SHORTNESS OF BREATH  amLODipine (NORVASC) 5 MG tablet Take 1 tablet (5 mg total) by mouth daily.  ARIPiprazole (ABILIFY) 10 MG tablet Take 1 tablet (10 mg total) by mouth daily.  gabapentin (NEURONTIN) 100 MG capsule Take 1 capsule (100 mg total) by mouth 3 (three) times daily.  hydroxychloroquine (PLAQUENIL) 200 MG tablet Take by mouth.  omeprazole (PRILOSEC) 20 MG capsule TAKE 1 CAPSULE BY MOUTH EVERY DAY  UNABLE TO FIND Eye cream 2 daily to help with dark circles around eyes.  warfarin (COUMADIN) 5 MG tablet Take 2.5 tablets (12.5 mg total) by mouth daily at 6 PM.   Past Medical History:  Diagnosis Date  . Anemia 2007    microcytic anemia, baseline hemoglobin 10-11, MCV at baseline 72-77, secondary to iron deficiency  . Arm DVT (deep  venous thromboembolism), acute [facility])  September 23, 2009, January 05, 2010    Doppler study significant with indeterminant age DVT involving the left upper extremity, Doppler performed January 05, 2010 consistent with acute DVT involving the left upper extremity  . Asthma   . Depression   . Hypertension   . Leukopenia 2008    unclear etiology baseline WBC  2.8-3.7  . Lung nodule  June 10, 2008    stable tiny noduke noted along the minor fissure of the right lung on CT angio September 11, 09 -  stable for 2 years and consistent with benign disease  . Pulmonary embolism [facility])  September 07, 2005    her CT angiogram - positive for pulmonary emboli to several branches of the right lower lobe- relatively small clot burden, clear lung; patient started on Coumadin; CT angiogram on January 10, 2006 showed resolution of previously seen pulmonary emboli with minimal basilar atelectasis  . Schizophrenia [facility])    Social History   Socioeconomic History  . Marital status: Married    Spouse name: Not on file  . Number of children: 5  . Years of education: In school  . Highest education level: Not on file  Occupational History  . Occupation: Disabled  Social Needs  . Financial resource strain: Not on file  . Food insecurity:    Worry: Not on file    Inability: Not  on file  . Transportation needs:    Medical: Not on file    Non-medical: Not on file  Tobacco Use  . Smoking status: Former Smoker    Packs/day: 0.25    Years: 30.00    Pack years: 7.50    Types: Cigarettes  . Smokeless tobacco: Never Used  . Tobacco comment: Quit x 3 months. Restarted 3 weeks ago.   Substance and Sexual Activity  . Alcohol use: Never    Alcohol/week: 0.0 standard drinks    Frequency: Never    Comment: Quit x 4 yrs.  . Drug use: Not Currently    Types: Marijuana  . Sexual activity: Yes    Birth control/protection: None    Comment: tubal  Lifestyle  . Physical activity:    Days per week: Not on file    Minutes  per session: Not on file  . Stress: Not on file  Relationships  . Social connections:    Talks on phone: Not on file    Gets together: Not on file    Attends religious service: Not on file    Active member of club or organization: Not on file    Attends meetings of clubs or organizations: Not on file    Relationship status: Not on file  Other Topics Concern  . 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 Hypertension Mother   . Congestive Heart Failure Mother   . Diabetes Father   . Birth defects Maternal Aunt   . Birth defects Maternal Uncle   . Diabetes Paternal Grandmother   . Breast cancer Sister   . Breast cancer Sister    ASSESSMENT Lab Results  Component Value Date   INR 3.4 (A) 11/23/2018   INR 1.8 (A) 10/26/2018   INR 2.4 09/28/2018   Anticoagulation Dosing: Description   Patient instructed to take 2 and 1/2 of her 5mg  peach-colored warfarin tablets by mouth, once-daily at 6PM--EXCEPT on MONDAYS and THURSDAYS, take three (3) tablets on Mondays and Thursdays. Return to the Emergency Department for any symptoms or signs of bleeding.      INR today: Supratherapeutic  PLAN Weekly dose was decreased from 92.5 mg/week to 90 mg/week.  Su-12.5mg M-15mg T-12.5mg W-12.5mg Th-12.5mg F-12.5mg Sa-12.5mg   There are no Patient Instructions on file for this visit. Patient advised to contact clinic or seek medical attention if signs/symptoms of bleeding or thromboembolism occur.  Patient verbalized understanding by repeating back information and was advised to contact me if further medication-related questions arise. Patient was also provided an information handout.  Follow-up Return in about 2 weeks (around  12/07/2018).  Flossie Dibble  15 minutes spent face-to-face with the patient during the encounter. 50% of time spent on education, including signs/sx bleeding and clotting, as well as food and drug interactions with warfarin. 50% of time was spent on fingerprick POC INR sample collection,processing, results determination, and documentation in http://[email or web address].[email or web address]/.

## 2018-12-06 ENCOUNTER — Ambulatory Visit (INDEPENDENT_AMBULATORY_CARE_PROVIDER_SITE_OTHER): Payer: Medicaid Other | Admitting: Neurology

## 2018-12-06 DIAGNOSIS — R51 Headache: Secondary | ICD-10-CM

## 2018-12-06 DIAGNOSIS — R519 Headache, unspecified: Secondary | ICD-10-CM

## 2018-12-06 DIAGNOSIS — G4719 Other hypersomnia: Secondary | ICD-10-CM

## 2018-12-06 DIAGNOSIS — J342 Deviated nasal septum: Secondary | ICD-10-CM

## 2018-12-06 DIAGNOSIS — G4733 Obstructive sleep apnea (adult) (pediatric): Secondary | ICD-10-CM

## 2018-12-06 DIAGNOSIS — Z6841 Body Mass Index (BMI) 40.0 and over, adult: Secondary | ICD-10-CM

## 2018-12-06 DIAGNOSIS — G4720 Circadian rhythm sleep disorder, unspecified type: Secondary | ICD-10-CM

## 2018-12-06 DIAGNOSIS — R351 Nocturia: Secondary | ICD-10-CM

## 2018-12-06 DIAGNOSIS — R419 Unspecified symptoms and signs involving cognitive functions and awareness: Secondary | ICD-10-CM

## 2018-12-06 DIAGNOSIS — F17200 Nicotine dependence, unspecified, uncomplicated: Secondary | ICD-10-CM

## 2018-12-07 ENCOUNTER — Ambulatory Visit: Payer: Medicaid Other

## 2018-12-07 DIAGNOSIS — D6859 Other primary thrombophilia: Secondary | ICD-10-CM | POA: Diagnosis not present

## 2018-12-07 DIAGNOSIS — Z7901 Long term (current) use of anticoagulants: Secondary | ICD-10-CM

## 2018-12-07 DIAGNOSIS — Z5181 Encounter for therapeutic drug level monitoring: Secondary | ICD-10-CM

## 2018-12-07 LAB — POCT INR: INR: 3.2 — AB (ref 2.0–3.0)

## 2018-12-07 NOTE — Patient Instructions (Signed)
Patient instructed to take medications as defined in the Anti-coagulation Track section of this encounter.  Patient instructed to take today's dose.  Patient instructed to take 2 and 1/2 of her 5mg peach-colored warfarin tablets by mouth, once-daily at 6PM.   Patient verbalized understanding of these instructions.   

## 2018-12-07 NOTE — Progress Notes (Signed)
Anticoagulation Management Michele Gillespie is a 43 y.o. female who reports to the clinic for monitoring of warfarin treatment.    Indication: Hypercoagualbe state and long term use of anticoagulants  Duration: indefinite Supervising physician: Lenice Pressman  Anticoagulation Clinic Visit History: Patient does not report signs/symptoms of bleeding or thromboembolism  Other recent changes: No diet, medications, lifestyle endorsed by patient Anticoagulation Episode Summary    Current INR goal:   2.0-3.0  TTR:   48.8 % (6.5 y)  Next INR check:   12/28/2018  INR from last check:   3.2! 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 Authorizing Provider  acetaminophen (TYLENOL) 325 MG tablet Take 650 mg by mouth every 6 (six) hours as needed for mild pain.    [provider]  albuterol (PROVENTIL HFA) 108 (90 Base) MCG/ACT inhaler INHALE 2 PUFFS INTO THE LUNGS EVERY 6 HOURS AS NEEDED FOR WHEEZING. FOR SHORTNESS OF BREATH 12/11/16   Ophelia Shoulder, MD  amLODipine (NORVASC) 5 MG tablet Take 1 tablet (5 mg total) by mouth daily. 04/09/18 04/09/19  Neva Seat, MD  ARIPiprazole (ABILIFY) 10 MG tablet Take 1 tablet (10 mg total) by mouth daily. 03/06/18   Colbert Ewing, MD  gabapentin (NEURONTIN) 100 MG capsule Take 1 capsule (100 mg total) by mouth 3 (three) times daily. 09/15/18   Kathrynn Ducking, MD  hydroxychloroquine (PLAQUENIL) 200 MG tablet Take by mouth. 09/07/18   [provider]  omeprazole (PRILOSEC) 20 MG capsule TAKE 1 CAPSULE BY MOUTH EVERY DAY 08/26/18   Neva Seat, MD  UNABLE TO FIND Eye cream 2 daily to  help with dark circles around eyes.    [provider]  warfarin (COUMADIN) 5 MG tablet Take 2.5 tablets (12.5 mg total) by mouth daily at 6 PM. 10/17/18   Pennie Banter, RPH-CPP   Past Medical History:  Diagnosis Date  . Anemia 2007    microcytic anemia, baseline hemoglobin 10-11, MCV at baseline 72-77, secondary to iron deficiency  . Arm DVT (deep venous thromboembolism), acute [facility])  September 23, 2009, January 05, 2010    Doppler study significant with indeterminant age DVT involving the left upper extremity, Doppler performed January 05, 2010 consistent with acute DVT involving the left upper extremity  . Asthma   . Depression   . Hypertension   . Leukopenia 2008    unclear etiology baseline WBC  2.8-3.7  . Lung nodule  June 10, 2008    stable tiny noduke noted along the minor fissure of the right lung on CT angio September 11, 09 -  stable for 2 years and consistent with benign disease  . Pulmonary embolism [facility])  September 07, 2005    her CT angiogram - positive for pulmonary emboli to several branches of the right lower lobe- relatively small clot burden, clear lung; patient started on Coumadin; CT angiogram on January 10, 2006 showed resolution of previously seen pulmonary emboli with minimal basilar atelectasis  . Schizophrenia [facility])    Social History   Socioeconomic History  .  Marital status: Married    Spouse name: Not on file  . Number of children: 5  . Years of education: In school  . Highest education level: Not on file  Occupational History  . Occupation: Disabled  Social Needs  . Financial resource strain: Not on file  . Food insecurity:    Worry: Not on file    Inability: Not on file  . Transportation needs:    Medical: Not on file    Non-medical: Not on file  Tobacco Use  . Smoking status: Former Smoker    Packs/day: 0.25    Years: 30.00    Pack years: 7.50    Types: Cigarettes  . Smokeless tobacco: Never Used  . Tobacco comment: Quit x 3 months.  Restarted 3 weeks ago.   Substance and Sexual Activity  . Alcohol use: Never    Alcohol/week: 0.0 standard drinks    Frequency: Never    Comment: Quit x 4 yrs.  . Drug use: Not Currently    Types: Marijuana  . Sexual activity: Yes    Birth control/protection: None    Comment: tubal  Lifestyle  . Physical activity:    Days per week: Not on file    Minutes per session: Not on file  . Stress: Not on file  Relationships  . Social connections:    Talks on phone: Not on file    Gets together: Not on file    Attends religious service: Not on file    Active member of club or organization: Not on file    Attends meetings of clubs or organizations: Not on file    Relationship status: Not on file  Other Topics Concern  . Not on file  Social History Narrative    Works as a Quarry manager, 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, has used cocaine in the past, history of multiple incarcerations last one in November 2011   Caffeine WCB:JSEG    Lives alone    Right handed    Family History  Problem Relation Age of Onset  . Hypertension Mother   . Congestive Heart Failure Mother   . Diabetes Father   . Birth defects Maternal Aunt   . Birth defects Maternal Uncle   . Diabetes Paternal Grandmother   . Breast cancer Sister   . Breast cancer Sister     ASSESSMENT Recent Results: The most recent result is correlated with 90 mg per week: Lab Results  Component Value Date   INR 3.2 (A) 12/07/2018   INR 3.4 (A) 11/23/2018   INR 1.8 (A) 10/26/2018    Anticoagulation Dosing: Description   Patient instructed to take 2 and 1/2 of her 5mg  peach-colored warfarin tablets by mouth, once-daily at [place]. Return to the Emergency Department for any symptoms or signs of bleeding.      INR today: Supratherapeutic  PLAN Weekly dose was decreased by 3% to 87.5 mg per week  Patient Instructions  Patient instructed to take medications as defined in the Anti-coagulation Track section of this  encounter.  Patient instructed to take today's dose.  Patient instructed to take 2 and 1/2 of her 5mg  peach-colored warfarin tablets by mouth, once-daily at [facility].  Patient verbalized understanding of these instructions.      Patient advised to contact clinic or seek medical attention if signs/symptoms of bleeding or thromboembolism occur.  Patient verbalized understanding by repeating back information and was advised to contact me if further medication-related questions arise. Patient was also  provided an information handout.  Follow-up Return in about 3 weeks (around 12/28/2018) for INR follow up.  Harrietta Guardian, PharmD PGY1 Pharmacy Resident 12/07/2018    11:23 AM Please check AMION for all [place] numbers  15 minutes spent face-to-face with the patient during the encounter. 50% of time spent on education, including signs/sx bleeding and clotting, as well as food and drug interactions with warfarin. 50% of time was spent on fingerprick POC INR sample collection,processing, results determination, and documentation in [email or web address]

## 2018-12-09 NOTE — Progress Notes (Signed)
INTERNAL MEDICINE TEACHING ATTENDING ADDENDUM  I agree with pharmacy recommendations as outlined in their note.   Alexander N Raines, MD  

## 2018-12-10 ENCOUNTER — Telehealth: Payer: Self-pay

## 2018-12-10 DIAGNOSIS — G4733 Obstructive sleep apnea (adult) (pediatric): Secondary | ICD-10-CM

## 2018-12-10 NOTE — Telephone Encounter (Signed)
I called pt. I advised pt that Dr. Rexene Alberts reviewed their sleep study results and found that pt has mild or borderline osa. Dr. Rexene Alberts recommends that pt either be treated with an autopap, oral appliance upper airway or jaw surgery, or weight loss with the avoidance of supine sleep. I reviewed PAP compliance expectations with the pt. Pt is agreeable to starting an auto-PAP. I advised pt that an order will be sent to a DME, [facility], and [facility] will call the pt within about one week after they file with the pt's insurance. [facility] will show the pt how to use the machine, fit for masks, and troubleshoot the auto-PAP if needed. A follow up appt was made for insurance purposes with Jinny Blossom, NP on 03/22/2019 at 2:00pm. Judson Roch, NP can't see cpap patients yet.) Pt verbalized understanding to arrive 15 minutes early and bring their auto-PAP. A letter with all of this information in it will be mailed to the pt as a reminder. I verified with the pt that the address we have on file is correct. Pt verbalized understanding of results. Pt had no questions at this time but was encouraged to call back if questions arise. I have sent the order to [facility] and have received confirmation that they have received the order.

## 2018-12-10 NOTE — Telephone Encounter (Signed)
-----   Message from Star Age, MD sent at 12/10/2018  8:35 AM EDT ----- Patient referred by Dr. Jannifer Franklin, seen by me on 11/09/18, diagnostic PSG on 12/06/18.    Please call and notify the patient that the recent sleep study showed overall mild or borderline OSA and treatment can be in the form of autoPAP. Other treatment options include avoidance of supine sleep position along with weight loss, upper airway or jaw surgery in selected patients or the use of an oral appliance in certain patients. If the patient would like to start autoPAP we can write for a machine and get her set up through a DME. Please let me know. Star Age, MD, PhD [facility])

## 2018-12-10 NOTE — Procedures (Signed)
PATIENT'S NAME:  Michele Gillespie, Michele Gillespie DOB:      Jul 15, 1976      MRN:    [id number]     DATE OF RECORDING: 12/06/2018 REFERRING M.D.:  Dr. Margette Fast Study Performed:   Baseline Polysomnogram HISTORY: 43 year old woman with a history of mood disorder, DVT, memory loss, history of pulmonary embolism, hypertension, asthma, anemia, and morbid obesity with a BMI of over 40, who reports snoring and excessive daytime somnolence as well as morning headaches. Her Epworth sleepiness score is 16 out of 24. The patient's weight 247 pounds with a height of 64 (inches), resulting in a BMI of 42.2 kg/m2. The patient's neck circumference measured 15.8 inches.  CURRENT MEDICATIONS: Tylenol, Proventil, Norvasc, Abilify, Neurontin, Plaquenil, Prilosec, Coumadin.   PROCEDURE:  This is a multichannel digital polysomnogram utilizing the Somnostar 11.2 system.  Electrodes and sensors were applied and monitored per AASM Specifications.   EEG, EOG, Chin and Limb EMG, were sampled at 200 Hz.  ECG, Snore and Nasal Pressure, Thermal Airflow, Respiratory Effort, CPAP Flow and Pressure, Oximetry was sampled at 50 Hz. Digital video and audio were recorded.      BASELINE STUDY: Lights Out was at 22:09 and Lights On at 04:59.  Total recording time (TRT) was 410.5 minutes, with a total sleep time (TST) of 347 minutes.   The patient's sleep latency was normal. REM latency was 93.5 minutes, which is normal. The sleep efficiency was 84.5%.     SLEEP ARCHITECTURE: WASO (Wake after sleep onset) was 56.5 minutes with mild to moderate sleep fragmentation noted. There were 8.5 minutes in Stage N1, 241 minutes Stage N2, 21.5 minutes Stage N3 and 76 minutes in Stage REM.  The percentage of Stage N1 was 2.4%, Stage N2 was 69.5%, which is increased, Stage N3 was 6.2% and Stage R (REM sleep) was 21.9%, which is normal.  RESPIRATORY ANALYSIS:  There were a total of 33 respiratory events:  7 obstructive apneas, 2 central apneas and 0 mixed apneas with  a total of 9 apneas and an apnea index (AI) of 1.6 /hour. There were 24 hypopneas with a hypopnea index of 4.1 /hour. The patient also had 0 respiratory event related arousals (RERAs).      The total APNEA/HYPOPNEA INDEX (AHI) was 5.7 /hour and the total RESPIRATORY DISTURBANCE INDEX was 5.7 /hour.  14 events occurred in REM sleep and 26 events in NREM. The REM AHI was 11.1 /hour, versus a non-REM AHI of 4.2. The patient spent 212 minutes of total sleep time in the supine position and 135 minutes in non-supine.. The supine AHI was /h 5.4 versus a non-supine AHI of 6.2/h.  OXYGEN SATURATION & C02:  The Wake baseline 02 saturation was 95%, with the lowest being 79%. Time spent below 89% saturation equaled 13 minutes.  AROUSALS:  The arousals were noted as: 51 were spontaneous, 0 were associated with PLMs, 6 were associated with respiratory events.  PLMs: The patient had a total of 0 Periodic Limb Movements.  The Periodic Limb Movement (PLM) index was 0 and the PLM Arousal index was 0/hour.  Audio and video analysis did not show any abnormal or unusual movements, complex behaviors, phonations or vocalizations. No nocturia. Mild to moderate snoring was noted. The EKG showed normal sinus rhythm (NSR).  Post study, the patient reported, sleep was worse than usual.  IMPRESSION:  1. Obstructive Sleep Apnea (OSA) 2. Dysfunctions associated with sleep stages or arousals from sleep   RECOMMENDATIONS:  1. This study demonstrates  overall mild or borderline obstructive sleep apnea, more pronounced in REM sleep with a total AHI of 5.7/hour, REM AHI of 11.1/hour, and O2 nadir of 79% during supine REM sleep. Treatment options include positive airway pressure therapy in the form of autoPAP or CPAP, avoidance of supine sleep position along with weight loss, upper airway or jaw surgery in selected patients or the use of an oral appliance in certain patients. ENT evaluation and/or consultation with a maxillofacial  surgeon or dentist may be feasible in some instances. The patient will be offered autoPAP therapy. 2. Please note that untreated obstructive sleep apnea may carry additional perioperative morbidity. Patients with significant obstructive sleep apnea should receive perioperative PAP therapy and the surgeons and particularly the anesthesiologist should be informed of the diagnosis and the severity of the sleep disordered breathing. 3. This study shows sleep fragmentation and abnormal sleep stage percentages; these are nonspecific findings and per se do not signify an intrinsic sleep disorder or a cause for the patient's sleep-related symptoms. Causes include (but are not limited to) the first night effect of the sleep study, circadian rhythm disturbances, medication effect or an underlying mood disorder or medical problem.  4. The patient should be cautioned not to drive, work at heights, or operate dangerous or heavy equipment when tired or sleepy. Review and reiteration of good sleep hygiene measures should be pursued with any patient. 5. The patient will be seen in follow-up in sleep clinic if needed. The referring provider will be notified of the test results.  I certify that I have reviewed the entire raw data recording prior to the issuance of this report in accordance with the Standards of Accreditation of the American Academy of Sleep Medicine (AASM)   Star Age, MD, PhD Diplomat, American Board of Neurology and Sleep Medicine (Neurology and Sleep Medicine)

## 2018-12-10 NOTE — Progress Notes (Signed)
Patient referred by Dr. Jannifer Franklin, seen by me on 11/09/18, diagnostic PSG on 12/06/18.    Please call and notify the patient that the recent sleep study showed overall mild or borderline OSA and treatment can be in the form of autoPAP. Other treatment options include avoidance of supine sleep position along with weight loss, upper airway or jaw surgery in selected patients or the use of an oral appliance in certain patients. If the patient would like to start autoPAP we can write for a machine and get her set up through a DME. Please let me know. Star Age, MD, PhD [facility])

## 2018-12-14 NOTE — Telephone Encounter (Signed)
See telephone note from last week regarding this.

## 2018-12-14 NOTE — Addendum Note (Signed)
Addended by: Star Age on: 12/14/2018 10:06 AM   Modules accepted: Orders

## 2018-12-14 NOTE — Telephone Encounter (Signed)
She has mild OSA and would like to start autoPAP. I placed an order for autoPAP and we can send order to a DME company (of her choice, or as per insurance requirement). The DME representative will educate her on how to use the machine, how to put the mask on, etc. I have placed an order in the chart. Please send referral, talk to patient, send report to referring MD. We will need a FU in sleep clinic for 10 weeks post-PAP set up, please arrange that with me or one of our NPs. Thanks,   Star Age, MD, PhD [facility])

## 2018-12-16 ENCOUNTER — Other Ambulatory Visit: Payer: Self-pay | Admitting: Pharmacist

## 2018-12-16 ENCOUNTER — Encounter: Payer: Self-pay | Admitting: Pharmacist

## 2018-12-16 DIAGNOSIS — Z7901 Long term (current) use of anticoagulants: Secondary | ICD-10-CM

## 2018-12-16 DIAGNOSIS — D6859 Other primary thrombophilia: Secondary | ICD-10-CM

## 2018-12-16 DIAGNOSIS — I82622 Acute embolism and thrombosis of deep veins of left upper extremity: Secondary | ICD-10-CM

## 2018-12-18 ENCOUNTER — Other Ambulatory Visit: Payer: Self-pay | Admitting: *Deleted

## 2018-12-18 DIAGNOSIS — I82622 Acute embolism and thrombosis of deep veins of left upper extremity: Secondary | ICD-10-CM

## 2018-12-18 DIAGNOSIS — D6859 Other primary thrombophilia: Secondary | ICD-10-CM

## 2018-12-18 DIAGNOSIS — Z7901 Long term (current) use of anticoagulants: Secondary | ICD-10-CM

## 2018-12-28 ENCOUNTER — Ambulatory Visit: Payer: Medicaid Other

## 2018-12-29 ENCOUNTER — Other Ambulatory Visit: Payer: Self-pay | Admitting: *Deleted

## 2018-12-29 ENCOUNTER — Telehealth: Payer: Self-pay | Admitting: Pharmacist

## 2018-12-29 DIAGNOSIS — D6859 Other primary thrombophilia: Secondary | ICD-10-CM

## 2018-12-29 DIAGNOSIS — Z7901 Long term (current) use of anticoagulants: Secondary | ICD-10-CM

## 2018-12-29 DIAGNOSIS — I82622 Acute embolism and thrombosis of deep veins of left upper extremity: Secondary | ICD-10-CM

## 2018-12-29 LAB — PROTIME-INR
INR: 2.3 — AB (ref 0.8–1.2)
PROTHROMBIN TIME: 23.9 s — AB (ref 9.1–12.0)

## 2018-12-29 NOTE — Telephone Encounter (Signed)
Patient was called, provided INR results and advised to continue same regimen of warfarin 12.5mg  per day (as 2 and 1/2 x 5mg  strength warfarin tablets. Repeat INR at [facility] on 25-Jan-2019.

## 2018-12-29 NOTE — Telephone Encounter (Signed)
Patient had venous drawn PT/INR collected at [facility] during COVID-19 pandemic which has made the Anticoagulation Management Clinic within the hospital-[facility] [place], unavailable for patients. Warfarin management is being performed based upon the test results from [facility]. INR = 2.30 on 12.5mg  warfarin per day (as 2 and 1/2 x 5mg  strength warfarin tablets). Patient denies any signs or symptoms of bleeding and no signs or symptoms of embolic disease. Next INR at [facility] will be 25-Jan-2019. Patient was called and provided this information.

## 2019-01-12 ENCOUNTER — Other Ambulatory Visit: Payer: Self-pay | Admitting: Pharmacist

## 2019-01-12 ENCOUNTER — Telehealth: Payer: Self-pay | Admitting: Neurology

## 2019-01-12 DIAGNOSIS — Z7901 Long term (current) use of anticoagulants: Secondary | ICD-10-CM

## 2019-01-12 DIAGNOSIS — D6859 Other primary thrombophilia: Secondary | ICD-10-CM

## 2019-01-12 NOTE — Telephone Encounter (Signed)
I called the patient and let her know our office is closed on Friday April 17th. Calling to reschedule her appointment for Wednesday or Thursday this week. If she calls back please switch to virtual visit.

## 2019-01-12 NOTE — Telephone Encounter (Signed)
Requesting refill on warfarin. States her pharmacy indicates she has no refills remaining. Will send electronic authorization to [place]. [place].

## 2019-01-13 NOTE — Progress Notes (Signed)
Virtual Visit via Video Note  I connected with Michele Gillespie on 01/13/19 at  1:15 PM EDT by a video enabled telemedicine application and verified that I am speaking with the correct person using two identifiers.   I discussed the limitations of evaluation and management by telemedicine and the availability of in person appointments. The patient expressed understanding and agreed to proceed.  History of Present Illness: 01/14/2019 SS: Michele Gillespie is a 43 year old female with history of memory problems. Her MMSE was 30/30 in December 2019. She had complained of headache, was started on gabapentin 100 mg three times daily. She was set up for neuropsychological evaluation.  She had a sleep evaluation in March 2020 showed mild or borderline obstructive sleep apnea, was set up for an auto-PAP.  She reports since using her CPAP machine her headaches and memory have improved.  She is no longer having to take the gabapentin.  She still has some trouble remembering what she did a few weeks ago, sometimes at work she will forget had a do things on her work tablet.  She currently works as a Psychologist, sport and exercise.  She does report that her CPAP mask she feels does not fit well on the left side of her nose because her left nostril is smaller than the right.  She notices that if she does not wear the mask at night she will have a headache the next day.  She was unable to get a neuropsychological evaluation.  She denies any changes to her medications or medical history.  She denies any new problems or concerns.  09/15/2018 Dr. Jannifer Franklin: Michele Gillespie is a 43 year old right-handed black female with a history of reported memory problems.  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  The patient recently was placed on medication for possible lupus.  The patient returns with ongoing issues with memory, she never had her neuropsychological evaluation.  The patient reports that she is  fatigued all the time, she will sleep for 8 hours but wakes up frequently during that period of time and she is tired when she gets up out of bed.  The patient has bad headaches coming out of sleep.  She does snore at night.  She returns for an evaluation.  The patient also reports that she is falling relatively frequently, she did fall in November 2019 and hurt her right thumb   Observations/Objective: Alert, follows commands, answers questions appropriately, speech is clear, gait is intact, facial symmetry noted  MOCA-Blind-22/22 (>18 is normal)  Assessment and Plan: 1.  Reported memory disturbance 2.  Daily headache 3.  History of schizophrenia  She reports her headache and memory have improved since starting CPAP.  She does report that she feels like her CPAP mask may not be fitting correctly, her left nostril is smaller.  I will send a message to Dr. Hermelinda Dellen nurse, to reach out to Ms. Olivero to see if she can help troubleshoot mask issues otherwise I suggest she discuss with her DME company.  She is no longer having to take gabapentin.  Her memory score is excellent.  She did not have a neuropsychological evaluation.  I am unsure if they have a specialist in town at this time.  She does have history of schizophrenia.  Her memory does appear to have improved since starting CPAP.  She will follow-up in this office in 6 months or sooner if needed, at that time we can  re-evaluate memory.  Advised that if her symptoms worsen or she develops any new symptoms she should let us know.  Follow Up Instructions: 6 months for revisit for memory, headaches   I discussed the assessment and treatment plan with the patient. The patient was provided an opportunity to ask questions and all were answered. The patient agreed with the plan and demonstrated an understanding of the instructions.   The patient was advised to call back or seek an in-person evaluation if the symptoms worsen or if the condition fails to  improve as anticipated.  I provided 20 minutes of non-face-to-face time during this encounter.   Evangeline Dakin, DNP  [facility] [address] [place], [place] [address] [phone]

## 2019-01-14 ENCOUNTER — Other Ambulatory Visit: Payer: Self-pay

## 2019-01-14 ENCOUNTER — Encounter: Payer: Self-pay | Admitting: Neurology

## 2019-01-14 ENCOUNTER — Telehealth: Payer: Self-pay

## 2019-01-14 ENCOUNTER — Ambulatory Visit (INDEPENDENT_AMBULATORY_CARE_PROVIDER_SITE_OTHER): Payer: Medicaid Other | Admitting: Neurology

## 2019-01-14 DIAGNOSIS — R413 Other amnesia: Secondary | ICD-10-CM | POA: Diagnosis not present

## 2019-01-14 DIAGNOSIS — R51 Headache: Secondary | ICD-10-CM | POA: Diagnosis not present

## 2019-01-14 DIAGNOSIS — R519 Headache, unspecified: Secondary | ICD-10-CM

## 2019-01-14 NOTE — Telephone Encounter (Signed)
Pt was set up on auto pap on 12/29/2018. I reached out to [facility] and asked them to call the pt to discuss a mask refit.  Pt may follow up in the office between 01/29/2019 and 03/30/19.  I called pt to discuss and offer her a sooner virtual visit to discuss her auto pap per compliance requirements. No answer, left a message asking her to call me back.

## 2019-01-14 NOTE — Progress Notes (Signed)
I have read the note, and I agree with the clinical assessment and plan.  Daegen Berrocal K Deneka Greenwalt   

## 2019-01-14 NOTE — Telephone Encounter (Signed)
-----   Message from Suzzanne Cloud, NP sent at 01/14/2019  1:41 PM EDT ----- Ree Edman,   I just talked with Ms. Kinnett.  She is a patient of Dr. Rexene Alberts, was diagnosed with mild OSA, started on CPAP.  She is telling me that she feels that her mask does not fit because her left nostril is smaller.  Could you possibly call her at your convenience and troubleshoot with her.  I suggested she check with her DME company.  The machine is greatly help with her headaches and memory!  Thanks!  Judson Roch

## 2019-01-15 ENCOUNTER — Ambulatory Visit: Payer: Medicaid Other | Admitting: Neurology

## 2019-01-19 NOTE — Telephone Encounter (Signed)
I called pt again to discuss. No answer, left a message asking her to call me back. 

## 2019-01-20 NOTE — Telephone Encounter (Signed)
I called pt. She is working with [facility] to find a better mask fit. She cannot have a sooner appt, even virtually, because she is unable to take time off of work. She reports that she has tried several masks with [facility] but hasn't been able to find a good fit and so she stopped using her cpap. I reminded of her cpap compliance expectations and asked her to keep working with [facility] to find a better mask fit. Pt will keep her appt on 03/22/19. Pt verbalized understanding of recommendations. Pt had no questions at this time but was encouraged to call back if questions arise.

## 2019-02-08 ENCOUNTER — Other Ambulatory Visit: Payer: Self-pay

## 2019-02-08 ENCOUNTER — Telehealth: Payer: Self-pay | Admitting: *Deleted

## 2019-02-08 ENCOUNTER — Encounter: Payer: Self-pay | Admitting: Internal Medicine

## 2019-02-08 ENCOUNTER — Ambulatory Visit: Payer: Medicaid Other | Admitting: Internal Medicine

## 2019-02-08 VITALS — BP 127/85 | HR 86 | Temp 98.9°F | Ht 64.5 in | Wt 244.9 lb

## 2019-02-08 DIAGNOSIS — Z7901 Long term (current) use of anticoagulants: Secondary | ICD-10-CM | POA: Diagnosis not present

## 2019-02-08 DIAGNOSIS — I10 Essential (primary) hypertension: Secondary | ICD-10-CM

## 2019-02-08 DIAGNOSIS — Z86718 Personal history of other venous thrombosis and embolism: Secondary | ICD-10-CM | POA: Diagnosis not present

## 2019-02-08 DIAGNOSIS — M79661 Pain in right lower leg: Secondary | ICD-10-CM | POA: Insufficient documentation

## 2019-02-08 DIAGNOSIS — Z79899 Other long term (current) drug therapy: Secondary | ICD-10-CM | POA: Diagnosis not present

## 2019-02-08 DIAGNOSIS — M329 Systemic lupus erythematosus, unspecified: Secondary | ICD-10-CM

## 2019-02-08 DIAGNOSIS — R791 Abnormal coagulation profile: Secondary | ICD-10-CM | POA: Diagnosis not present

## 2019-02-08 HISTORY — DX: Pain in right lower leg: M79.661

## 2019-02-08 LAB — POCT INR: INR: 3.3 — AB (ref 2.0–3.0)

## 2019-02-08 NOTE — Telephone Encounter (Signed)
Ok. Thank you.

## 2019-02-08 NOTE — Assessment & Plan Note (Signed)
BP Readings from Last 3 Encounters:  02/08/19 127/85  11/09/18 (!) 144/98  09/15/18 116/84   Blood pressure was within goal.  -Continue current management.

## 2019-02-08 NOTE — Progress Notes (Signed)
   CC: Right calf pain for 3 days.  HPI:  Michele Gillespie is a 43 y.o. with past medical history as listed below came to the clinic with complaint of right calf pain for the past 3 days.  There was no swelling.  Patient is compliant with Coumadin.  Denies any recent travel or prolonged immobilization, she stays on her feet most of the time.  No recent surgeries. She was worried due to her history of DVT secondary to hypercoagulable state and lupus.  Please see assessment and plan.  Past Medical History:  Diagnosis Date  . Anemia 2007    microcytic anemia, baseline hemoglobin 10-11, MCV at baseline 72-77, secondary to iron deficiency  . Arm DVT (deep venous thromboembolism), acute [facility])  September 23, 2009, January 05, 2010    Doppler study significant with indeterminant age DVT involving the left upper extremity, Doppler performed January 05, 2010 consistent with acute DVT involving the left upper extremity  . Asthma   . Depression   . Hypertension   . Leukopenia 2008    unclear etiology baseline WBC  2.8-3.7  . Lung nodule  June 10, 2008    stable tiny noduke noted along the minor fissure of the right lung on CT angio September 11, 09 -  stable for 2 years and consistent with benign disease  . Pulmonary embolism [facility])  September 07, 2005    her CT angiogram - positive for pulmonary emboli to several branches of the right lower lobe- relatively small clot burden, clear lung; patient started on Coumadin; CT angiogram on January 10, 2006 showed resolution of previously seen pulmonary emboli with minimal basilar atelectasis  . Schizophrenia ([place])    Review of Systems: Negative except mentioned in HPI.  Physical Exam:  Vitals:   02/08/19 1506  BP: 127/85  Pulse: 86  Temp: 98.9 F (37.2 C)  TempSrc: Oral  SpO2: 99%  Weight: 244 lb 14.4 oz (111.1 kg)  Height: 5' 4.5" (1.638 m)   General: Vital signs reviewed.  Patient is well-developed and well-nourished, in no acute distress and  cooperative with exam.  Head: Normocephalic and atraumatic. Eyes: EOMI, conjunctivae normal, no scleral icterus.   Cardiovascular: RRR, S1 normal, S2 normal, no murmurs, gallops, or rubs. Pulmonary/Chest: Clear to auscultation bilaterally, no wheezes, rales, or rhonchi. Abdominal: Soft, non-tender, non-distended, BS +, Extremities: Right calf tenderness with no erythema or edema.  No lower extremity edema bilaterally,  pulses symmetric and intact bilaterally.  Skin: Warm, dry and intact. No rashes or erythema. Psychiatric: Normal mood and affect. speech and behavior is normal. Cognition and memory are normal.  Assessment & Plan:   See Encounters Tab for problem based charting.  Patient discussed with Dr. Dareen Piano.

## 2019-02-08 NOTE — Patient Instructions (Signed)
Thank you for visiting clinic today. Your INR is little up at 3.3 today, take 2 pills instead of 2-1/2 today and then resume your normal dose. You can use warm compresses and compression stocking to help with your pain. Keep your legs elevated. If your pain gets worse or if you develop swelling please call the clinic. Please follow-up at Coumadin clinic within a week to repeat your INR.

## 2019-02-08 NOTE — Telephone Encounter (Signed)
Pt calls and states she spoke w/ dr groce this am and has a "clot in my R leg" I need to be seen. Suggested urg care, refused, appt dr Reesa Chew per charsetta 1515.

## 2019-02-08 NOTE — Assessment & Plan Note (Signed)
Patient did had right calf tenderness with no edema or erythema.  She was worried due to her history of DVT. INR checked today and it was 3.3. Patient is compliant with her Coumadin 12.5 mg daily.  She denies any new medication or change in her diet. Denies any chest pain or shortness of breath.  -She has mildly supratherapeutic INR.  This likely that this pain is due to DVT.  Baker's cyst can be another possibility. -Advised her to use some warm compresses and keep her leg elevated. -She can also try using compression stockings or Ace wrap. -She will take 10 mg of Coumadin today and then resume her normal dose from tomorrow. -Follow-up in 1 week for INR check or if her current symptoms get worse.

## 2019-02-10 NOTE — Progress Notes (Signed)
Internal Medicine Clinic Attending  Case discussed with Dr. Reesa Chew at the time of the visit.  We reviewed the resident's history and exam and pertinent patient test results.  I agree with the assessment, diagnosis, and plan of care documented in the resident's note.  It is unlikely that patient has a DVT given lack of swelling and supra therapeutic INR

## 2019-02-11 ENCOUNTER — Telehealth: Payer: Self-pay | Admitting: *Deleted
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
## 2019-02-16 ENCOUNTER — Ambulatory Visit: Payer: Medicaid Other | Admitting: Pharmacist

## 2019-02-16 ENCOUNTER — Encounter (HOSPITAL_COMMUNITY): Payer: Self-pay | Admitting: Emergency Medicine

## 2019-02-16 ENCOUNTER — Emergency Department (HOSPITAL_COMMUNITY): Payer: Medicaid Other

## 2019-02-16 ENCOUNTER — Telehealth: Payer: Self-pay | Admitting: Pharmacist

## 2019-02-16 ENCOUNTER — Other Ambulatory Visit: Payer: Self-pay

## 2019-02-16 ENCOUNTER — Emergency Department (HOSPITAL_COMMUNITY)
Admission: EM | Admit: 2019-02-16 | Discharge: 2019-02-16 | Disposition: A | Discharge status: Home / Self Care | Payer: Medicaid Other | Attending: Emergency Medicine | Admitting: Emergency Medicine

## 2019-02-16 DIAGNOSIS — Y999 Unspecified external cause status: Secondary | ICD-10-CM | POA: Diagnosis not present

## 2019-02-16 DIAGNOSIS — Z79899 Other long term (current) drug therapy: Secondary | ICD-10-CM | POA: Diagnosis not present

## 2019-02-16 DIAGNOSIS — F17210 Nicotine dependence, cigarettes, uncomplicated: Secondary | ICD-10-CM | POA: Diagnosis not present

## 2019-02-16 DIAGNOSIS — Z7901 Long term (current) use of anticoagulants: Secondary | ICD-10-CM

## 2019-02-16 DIAGNOSIS — Y929 Unspecified place or not applicable: Secondary | ICD-10-CM | POA: Insufficient documentation

## 2019-02-16 DIAGNOSIS — M79622 Pain in left upper arm: Secondary | ICD-10-CM | POA: Diagnosis not present

## 2019-02-16 DIAGNOSIS — Z5181 Encounter for therapeutic drug level monitoring: Secondary | ICD-10-CM | POA: Diagnosis not present

## 2019-02-16 DIAGNOSIS — R0789 Other chest pain: Secondary | ICD-10-CM | POA: Insufficient documentation

## 2019-02-16 DIAGNOSIS — D6859 Other primary thrombophilia: Secondary | ICD-10-CM | POA: Diagnosis present

## 2019-02-16 DIAGNOSIS — Z86718 Personal history of other venous thrombosis and embolism: Secondary | ICD-10-CM | POA: Insufficient documentation

## 2019-02-16 DIAGNOSIS — M542 Cervicalgia: Secondary | ICD-10-CM | POA: Diagnosis not present

## 2019-02-16 DIAGNOSIS — Y939 Activity, unspecified: Secondary | ICD-10-CM | POA: Diagnosis not present

## 2019-02-16 DIAGNOSIS — Z86711 Personal history of pulmonary embolism: Secondary | ICD-10-CM | POA: Diagnosis not present

## 2019-02-16 DIAGNOSIS — I10 Essential (primary) hypertension: Secondary | ICD-10-CM | POA: Insufficient documentation

## 2019-02-16 DIAGNOSIS — M546 Pain in thoracic spine: Secondary | ICD-10-CM

## 2019-02-16 LAB — PREGNANCY, URINE: Preg Test, Ur: NEGATIVE

## 2019-02-16 LAB — POCT INR: INR: 3.3 — AB (ref 2.0–3.0)

## 2019-02-16 MED ORDER — HYDROCODONE-ACETAMINOPHEN 5-325 MG PO TABS: 1.0000 | ORAL_TABLET | Freq: Four times a day (QID) | ORAL | 0 refills | Status: DC | PRN

## 2019-02-16 MED ORDER — METHOCARBAMOL 500 MG PO TABS: 500.0000 mg | ORAL_TABLET | Freq: Two times a day (BID) | ORAL | 0 refills | Status: DC

## 2019-02-16 MED ORDER — ACETAMINOPHEN 500 MG PO TABS: 1000.0000 mg | ORAL_TABLET | Freq: Once | ORAL | Status: DC

## 2019-02-16 MED ORDER — ACETAMINOPHEN 325 MG PO TABS
650.0000 mg | ORAL_TABLET | Freq: Once | ORAL | Status: DC
  Filled 2019-02-16: qty 2

## 2019-02-16 MED ORDER — ACETAMINOPHEN 325 MG PO TABS
650.0000 mg | ORAL_TABLET | Freq: Once | ORAL | Status: AC
  Administered 2019-02-16: 650 mg via ORAL

## 2019-02-16 MED ORDER — METHOCARBAMOL 500 MG PO TABS
500.0000 mg | ORAL_TABLET | Freq: Once | ORAL | Status: AC
  Administered 2019-02-16: 21:00:00 500 mg via ORAL
  Filled 2019-02-16: qty 1
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
## 2019-02-16 NOTE — Patient Instructions (Signed)
Patient instructed to take medications as defined in the Anti-coagulation Track section of this encounter.  Patient instructed to take today's dose.  Patient instructed to take  2 and 1/2 of her 5mg  peach-colored warfarin tablets by mouth, once-daily at 6PM--EXCEPT on TUESDAYS, take only 2 tablets of her 5mg  peach-colored warfarin tablets on TUESDAYS. Return to the Emergency Department for any symptoms or signs of bleeding. Patient verbalized understanding of these instructions.
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
## 2019-02-16 NOTE — Progress Notes (Signed)
Anticoagulation Management Michele Gillespie is a 43 y.o. female who reports to the clinic for monitoring of warfarin treatment.    Indication: Hypercoagulable state, primary; Long term current use of anticoagulant.    Duration: indefinite Supervising physician: Lenice Pressman, MD  Anticoagulation Clinic Visit History: Patient does not report signs/symptoms of bleeding or thromboembolism  Other recent changes: No diet, medications, lifestyle changes endorsed at this visit.  Anticoagulation Episode Summary    Current INR goal:   2.0-3.0  TTR:   49.2 % (6.7 y)  Next INR check:   03/01/2019  INR from last check:     Weekly max warfarin dose:     Target end date:   Indefinite  INR check location:   Anticoagulation Clinic  Preferred lab:     Send INR reminders to:      Indications   HYPERCOAGULABLE STATE PRIMARY [D68.59] Long term current use of anticoagulant [Z79.01]       Comments:           No Known Allergies Prior to Admission medications   Medication Sig Start Date End Date Taking? Authorizing Provider  acetaminophen (TYLENOL) 325 MG tablet Take 650 mg by mouth every 6 (six) hours as needed for mild pain.   Yes [provider]  albuterol (PROVENTIL HFA) 108 (90 Base) MCG/ACT inhaler INHALE 2 PUFFS INTO THE LUNGS EVERY 6 HOURS AS NEEDED FOR WHEEZING. FOR SHORTNESS OF BREATH 12/11/16  Yes Ophelia Shoulder, MD  amLODipine (NORVASC) 5 MG tablet Take 1 tablet (5 mg total) by mouth daily. 04/09/18 04/09/19 Yes Neva Seat, MD  ARIPiprazole (ABILIFY) 10 MG tablet Take 1 tablet (10 mg total) by mouth daily. 03/06/18  Yes Colbert Ewing, MD  gabapentin (NEURONTIN) 100 MG capsule Take 1 capsule (100 mg total) by mouth 3 (three) times daily. 09/15/18  Yes Kathrynn Ducking, MD  hydroxychloroquine (PLAQUENIL) 200 MG tablet Take by mouth. 09/07/18  Yes [provider]  omeprazole (PRILOSEC) 20 MG capsule TAKE 1 CAPSULE BY MOUTH EVERY DAY 08/26/18  Yes Neva Seat, MD   UNABLE TO FIND Eye cream 2 daily to help with dark circles around eyes.   Yes [provider]  warfarin (COUMADIN) 5 MG tablet Take 2.5 tablets (12.5 mg total) by mouth daily at 6 PM. 01/12/19  Yes Pennie Banter, RPH-CPP   Past Medical History:  Diagnosis Date  . Anemia 2007    microcytic anemia, baseline hemoglobin 10-11, MCV at baseline 72-77, secondary to iron deficiency  . Arm DVT (deep venous thromboembolism), acute [facility])  September 23, 2009, January 05, 2010    Doppler study significant with indeterminant age DVT involving the left upper extremity, Doppler performed January 05, 2010 consistent with acute DVT involving the left upper extremity  . Asthma   . Depression   . Hypertension   . Leukopenia 2008    unclear etiology baseline WBC  2.8-3.7  . Lung nodule  June 10, 2008    stable tiny noduke noted along the minor fissure of the right lung on CT angio September 11, 09 -  stable for 2 years and consistent with benign disease  . Pulmonary embolism [facility])  September 07, 2005    her CT angiogram - positive for pulmonary emboli to several branches of the right lower lobe- relatively small clot burden, clear lung; patient started on Coumadin; CT angiogram on January 10, 2006 showed resolution of previously seen pulmonary emboli with minimal basilar atelectasis  . Schizophrenia ([place])  Social History   Socioeconomic History  . Marital status: Married    Spouse name: Not on file  . Number of children: 5  . Years of education: In school  . Highest education level: Not on file  Occupational History  . Occupation: Disabled  Social Needs  . Financial resource strain: Not on file  . Food insecurity:    Worry: Not on file    Inability: Not on file  . Transportation needs:    Medical: Not on file    Non-medical: Not on file  Tobacco Use  . Smoking status: Former Smoker    Packs/day: 0.25    Years: 30.00    Pack years: 7.50    Types: Cigarettes  . Smokeless tobacco: Never  Used  . Tobacco comment: Quit x 3 months. Restarted 3 weeks ago.   Substance and Sexual Activity  . Alcohol use: Never    Alcohol/week: 0.0 standard drinks    Frequency: Never    Comment: Quit x 4 yrs.  . Drug use: Not Currently    Types: Marijuana  . Sexual activity: Yes    Birth control/protection: None    Comment: tubal  Lifestyle  . Physical activity:    Days per week: Not on file    Minutes per session: Not on file  . Stress: Not on file  Relationships  . Social connections:    Talks on phone: Not on file    Gets together: Not on file    Attends religious service: Not on file    Active member of club or organization: Not on file    Attends meetings of clubs or organizations: Not on file    Relationship status: Not on file  Other Topics Concern  . 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 Hypertension Mother   . Congestive Heart Failure Mother   . Diabetes Father   . Birth defects Maternal Aunt   . Birth defects Maternal Uncle   . Diabetes Paternal Grandmother   . Breast cancer Sister   . Breast cancer Sister     ASSESSMENT Recent Results: The most recent result is correlated with 87.5 mg per week: Lab Results  Component Value Date   INR 3.3 (A) 02/16/2019   INR 3.3 (A) 02/08/2019   INR 2.3 (H) 12/28/2018    Anticoagulation Dosing: Description   Patient instructed to take 2 and 1/2 of her 5mg  peach-colored warfarin tablets by mouth, once-daily at 6PM--EXCEPT on TUESDAYS, take only 2 tablets of her 5mg  peach-colored warfarin tablets on TUESDAYS. Return to the Emergency Department for any symptoms or signs of bleeding.      INR today: Supratherapeutic  PLAN Weekly dose was decreased by 3% to 85 mg  per week  Patient Instructions  Patient instructed to take medications as defined in the Anti-coagulation Track section of this encounter.  Patient instructed to take today's dose.  Patient instructed to take  2 and 1/2 of her 5mg  peach-colored warfarin tablets by mouth, once-daily at 6PM--EXCEPT on TUESDAYS, take only 2 tablets of her 5mg  peach-colored warfarin tablets on TUESDAYS. Return to the Emergency Department for any symptoms or signs of bleeding. Patient  verbalized understanding of these instructions.     Patient advised to contact clinic or seek medical attention if signs/symptoms of bleeding or thromboembolism occur.  Patient verbalized understanding by repeating back information and was advised to contact me if further medication-related questions arise. Patient was also provided an information handout.  Follow-up Return in 13 days (on 03/01/2019) for Follow up INR at 3:15PM LAST SLOT. Pennie Banter, PharmD, CPP  15 minutes spent face-to-face with the patient during the encounter. 50% of time spent on education, including signs/sx bleeding and clotting, as well as food and drug interactions with warfarin. 50% of time was spent on fingerprick POC INR sample collection,processing, results determination, and documentation in http://[email or web address].net/.

## 2019-02-16 NOTE — Telephone Encounter (Signed)
Called patient to discuss INR 3.3 from 08-Feb-2019 made available to me today. Patient is coming in now for repeat INR as she had been advised to do during her 08-Feb-2019 visit.

## 2019-02-16 NOTE — Progress Notes (Signed)
INTERNAL MEDICINE TEACHING ATTENDING ADDENDUM  I agree with pharmacy recommendations as outlined in their note.   Alexander N Raines, MD  

## 2019-02-25 ENCOUNTER — Encounter: Payer: Self-pay | Admitting: Internal Medicine

## 2019-02-25 ENCOUNTER — Other Ambulatory Visit: Payer: Self-pay

## 2019-02-25 ENCOUNTER — Ambulatory Visit (INDEPENDENT_AMBULATORY_CARE_PROVIDER_SITE_OTHER): Payer: Medicaid Other | Admitting: Internal Medicine

## 2019-02-25 DIAGNOSIS — Z79899 Other long term (current) drug therapy: Secondary | ICD-10-CM | POA: Diagnosis not present

## 2019-02-25 DIAGNOSIS — G44329 Chronic post-traumatic headache, not intractable: Secondary | ICD-10-CM

## 2019-02-25 DIAGNOSIS — D6859 Other primary thrombophilia: Secondary | ICD-10-CM

## 2019-02-25 DIAGNOSIS — I10 Essential (primary) hypertension: Secondary | ICD-10-CM | POA: Diagnosis not present

## 2019-02-25 DIAGNOSIS — R413 Other amnesia: Secondary | ICD-10-CM

## 2019-02-25 DIAGNOSIS — M549 Dorsalgia, unspecified: Secondary | ICD-10-CM | POA: Insufficient documentation
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
## 2019-02-25 NOTE — Assessment & Plan Note (Signed)
Patient's BP was checked at her visit on 5/11 and remains well controled, will continue current regimen. - Amlodipine 5mg  Daily.
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
## 2019-02-25 NOTE — Assessment & Plan Note (Signed)
Patient was seen in [facility] on 5/11 with LE swelling. She was concerned about DVT given her history. INR was supratherapeutic at that time (3.3). She was advised to elevate her leg and apply warm compresses. She states her swelling has resolved.  At her follow up INR check she remained supratherapeutic at 3.3. Her dose was adjusted and she has a follow up soon. - Continue to follow with coumadin clinic

## 2019-02-26 NOTE — Progress Notes (Signed)
Internal Medicine Clinic Attending  Case discussed with Dr. Melvin  at the time of the visit.  We reviewed the resident's history and exam and pertinent patient test results.  I agree with the assessment, diagnosis, and plan of care documented in the resident's note.  

## 2019-03-01 ENCOUNTER — Ambulatory Visit: Payer: Medicaid Other

## 2019-03-02 ENCOUNTER — Encounter: Payer: Self-pay | Admitting: Sports Medicine

## 2019-03-02 ENCOUNTER — Encounter: Payer: Medicaid Other | Admitting: Sports Medicine

## 2019-03-02 ENCOUNTER — Ambulatory Visit (INDEPENDENT_AMBULATORY_CARE_PROVIDER_SITE_OTHER): Payer: Medicaid Other | Admitting: Sports Medicine

## 2019-03-02 DIAGNOSIS — S22000A Wedge compression fracture of unspecified thoracic vertebra, initial encounter for closed fracture: Secondary | ICD-10-CM

## 2019-03-02 DIAGNOSIS — S134XXA Sprain of ligaments of cervical spine, initial encounter: Secondary | ICD-10-CM | POA: Diagnosis not present

## 2019-03-02 DIAGNOSIS — M67912 Unspecified disorder of synovium and tendon, left shoulder: Secondary | ICD-10-CM | POA: Diagnosis not present

## 2019-03-02 HISTORY — DX: Wedge compression fracture of unspecified thoracic vertebra, initial encounter for closed fracture: S22.000A

## 2019-03-02 MED ORDER — PREDNISONE 50 MG PO TABS: ORAL_TABLET | ORAL | 0 refills | Status: DC
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
## 2019-03-02 NOTE — Assessment & Plan Note (Signed)
Adding formal physical therapy. Return in a month, injection if no better.

## 2019-03-04 ENCOUNTER — Ambulatory Visit: Payer: Medicaid Other | Admitting: Physical Therapy

## 2019-03-18 ENCOUNTER — Telehealth: Payer: Self-pay

## 2019-03-18 NOTE — Telephone Encounter (Signed)
Spoke with the patient and they have given verbal consent to file insurance and to do a mychart video visit. Mychart video link has been sent to the patient.   

## 2019-03-22 ENCOUNTER — Telehealth: Payer: Self-pay | Admitting: Adult Health

## 2019-03-22 NOTE — Progress Notes (Deleted)
PATIENT: Michele Gillespie DOB: [birth year]  REASON FOR VISIT: follow up HISTORY FROM: patient  Virtual Visit via Video Note  I connected with Michele Gillespie on 03/22/19 at  2:00 PM EDT by a video enabled telemedicine application located remotely at [facility] and verified that I am speaking with the correct person using two identifiers who was located at their own home.   I discussed the limitations of evaluation and management by telemedicine and the availability of in person appointments. The patient expressed understanding and agreed to proceed.   PATIENT: Michele Gillespie DOB: June 24, 1976  REASON FOR VISIT: follow up HISTORY FROM: patient  HISTORY OF PRESENT ILLNESS: Today 03/22/19:  The patient CPAP download shows that she has not used her machine this month at all.  She reports that  HISTORY (copied from Dr. Guadelupe Sabin note) Michele Gillespie is a 43 year old right-handed woman with an underlying medical history of mood disorder, DVT, memory loss, history of pulmonary embolism, hypertension, asthma, anemia, and morbid obesity with a BMI of over 40, who reports snoring and excessive daytime somnolence as well as morning headaches. I reviewed your office note from 09/15/2018. Her Epworth sleepiness score is 16 out of 24 today, fatigue score is 54 out of 63. She is married, lives with her husband, has 5 children. She works for [employer] in [employer], and is also a Ship broker at [employer], 2 nights out of the week, also works PT in [place], T/Th, 9 to [place], Sat 5 pm to 9 pm. She has gained quite a bit of weight in less than 6 months. She has nocturia about 2-3 times per night. She smokes about a half a pack per day, restarted about 1 year ago, attributes this to stress. She has 5 sons, one lives with them. She does not utilize alcohol does not drink caffeine on a regular basis, she does not know if there is OSA in the [facility]. Her husband has noted pauses in her  breathing while she is asleep. She has a TV in the bedroom, turns it off, no pets in the house. She has woken up with a HA. She has nasal congestion.   REVIEW OF SYSTEMS: Out of a complete 14 system review of symptoms, the patient complains only of the following symptoms, and all other reviewed systems are negative.  ALLERGIES: No Known Allergies  HOME MEDICATIONS: Outpatient Medications Prior to Visit  Medication Sig Dispense Refill  . albuterol (PROVENTIL HFA) 108 (90 Base) MCG/ACT inhaler INHALE 2 PUFFS INTO THE LUNGS EVERY 6 HOURS AS NEEDED FOR WHEEZING. FOR SHORTNESS OF BREATH (Patient taking differently: Inhale 2 puffs into the lungs every 6 (six) hours as needed for wheezing or shortness of breath. ) 6.7 Inhaler 0  . amLODipine (NORVASC) 5 MG tablet Take 1 tablet (5 mg total) by mouth daily. 30 tablet 11  . ARIPiprazole (ABILIFY) 10 MG tablet Take 1 tablet (10 mg total) by mouth daily. 30 tablet 0  . HYDROcodone-acetaminophen (NORCO/VICODIN) 5-325 MG tablet Take 1 tablet by mouth every 6 (six) hours as needed for severe pain. 8 tablet 0  . hydroxychloroquine (PLAQUENIL) 200 MG tablet Take 400 mg by mouth daily.     . methocarbamol (ROBAXIN) 500 MG tablet Take 1 tablet (500 mg total) by mouth 2 (two) times daily. 20 tablet 0  . omeprazole (PRILOSEC) 20 MG capsule TAKE 1 CAPSULE BY MOUTH EVERY DAY (Patient taking differently: Take 20 mg by mouth daily. )  30 capsule 3  . predniSONE (DELTASONE) 50 MG tablet One tab PO daily for 5 days. 5 tablet 0  . warfarin (COUMADIN) 5 MG tablet Take 2.5 tablets (12.5 mg total) by mouth daily at 6 PM. 76 tablet 2   No facility-administered medications prior to visit.     PAST MEDICAL HISTORY: Past Medical History:  Diagnosis Date  . Anemia 2007    microcytic anemia, baseline hemoglobin 10-11, MCV at baseline 72-77, secondary to iron deficiency  . Arm DVT (deep venous thromboembolism), acute [facility])  September 23, 2009, January 05, 2010    Doppler study  significant with indeterminant age DVT involving the left upper extremity, Doppler performed January 05, 2010 consistent with acute DVT involving the left upper extremity  . Asthma   . Depression   . H/O cesarean section 12/21/2012   5 CS, had BTL at last procedure    . Hypertension   . Leukopenia 2008    unclear etiology baseline WBC  2.8-3.7  . Lung nodule  June 10, 2008    stable tiny noduke noted along the minor fissure of the right lung on CT angio September 11, 09 -  stable for 2 years and consistent with benign disease  . Pulmonary embolism [facility])  September 07, 2005    her CT angiogram - positive for pulmonary emboli to several branches of the right lower lobe- relatively small clot burden, clear lung; patient started on Coumadin; CT angiogram on January 10, 2006 showed resolution of previously seen pulmonary emboli with minimal basilar atelectasis  . Schizophrenia ([place])     PAST SURGICAL HISTORY: Past Surgical History:  Procedure Laterality Date  . CESAREAN SECTION      History of 5 C-section  . EXPLORATORY LAPAROTOMY WITH ABDOMINAL MASS EXCISION  02/2005  . TUBAL LIGATION      FAMILY HISTORY: Family History  Problem Relation Age of Onset  . Hypertension Mother   . Congestive Heart Failure Mother   . Diabetes Father   . Birth defects Maternal Aunt   . Birth defects Maternal Uncle   . Diabetes Paternal Grandmother   . Breast cancer Sister   . Breast cancer Sister     SOCIAL HISTORY: Social History   Socioeconomic History  . Marital status: Married    Spouse name: Not on file  . Number of children: 5  . Years of education: In school  . Highest education level: Not on file  Occupational History  . Occupation: Disabled  Social Needs  . Financial resource strain: Not on file  . Food insecurity    Worry: Not on file    Inability: Not on file  . Transportation needs    Medical: Not on file    Non-medical: Not on file  Tobacco Use  . Smoking status: Former Smoker     Packs/day: 0.25    Years: 30.00    Pack years: 7.50    Types: Cigarettes  . Smokeless tobacco: Never Used  . Tobacco comment: Quit x 3 months. Restarted 3 weeks ago.   Substance and Sexual Activity  . Alcohol use: Never    Alcohol/week: 0.0 standard drinks    Frequency: Never    Comment: Quit x 4 yrs.  . Drug use: Not Currently    Types: Marijuana  . Sexual activity: Yes    Birth control/protection: None    Comment: tubal  Lifestyle  . Physical activity    Days per week: Not on file    Minutes  per session: Not on file  . Stress: Not on file  Relationships  . Social Herbalist on phone: Not on file    Gets together: Not on file    Attends religious service: Not on file    Active member of club or organization: Not on file    Attends meetings of clubs or organizations: Not on file    Relationship status: Not on file  . Intimate partner violence    Fear of current or ex partner: Not on file    Emotionally abused: Not on file    Physically abused: Not on file    Forced sexual activity: Not on file  Other Topics Concern  . 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 Follows all commands speech and language fluent Cranial nerve II-XII:Extraocular movements were full. Facial symmetry noted. uvula tongue midline. Head turning and shoulder shrug  were normal and symmetric. Motor: Good strength throughout subjectively per patient Sensory: Sensory testing is intact to soft touch on all 4 extremities subjectively per patient Coordination: Cerebellar testing reveals good finger-nose-finger  Gait and station:  Patient is able to stand from a seated position. gait is normal.  Reflexes: UTA  DIAGNOSTIC DATA (LABS, IMAGING, TESTING) - I reviewed patient records, labs, notes, testing and imaging myself where available.  Lab Results  Component Value Date   WBC 2.9 (L) 08/19/2018   HGB 13.6 08/19/2018   HCT 45.2 08/19/2018   MCV 78.3 (L) 08/19/2018   PLT 222 08/19/2018      Component Value Date/Time   NA 137 08/19/2018 1735   K 3.8 08/19/2018 1735   CL 109 08/19/2018 1735   CO2 21 (L) 08/19/2018 1735   GLUCOSE 102 (H) 08/19/2018 1735   BUN 18 08/19/2018 1735   CREATININE 1.02 (H) 08/19/2018 1735   CREATININE 0.86 06/16/2018 1102   CREATININE 0.85 04/29/2014 1029   CALCIUM 9.2 08/19/2018 1735   PROT 6.8 08/19/2018 1735   ALBUMIN 3.5 08/19/2018 1735   AST 30 08/19/2018 1735   AST 23 06/16/2018 1102   ALT 43 08/19/2018 1735   ALT 35 06/16/2018 1102   ALKPHOS 41 08/19/2018 1735   BILITOT 0.5 08/19/2018 1735   BILITOT 0.4 06/16/2018 1102   GFRNONAA >60 08/19/2018 1735   GFRNONAA >60 06/16/2018 1102   GFRNONAA 86 04/29/2014 1029   GFRAA >60 08/19/2018 1735   GFRAA >60 06/16/2018 1102   GFRAA >89 04/29/2014 1029   Lab Results  Component Value Date   CHOL 181 07/04/2009   HDL 58 07/04/2009   LDLCALC 115 (H) 07/04/2009   TRIG 41 07/04/2009   CHOLHDL 3.1 Ratio 07/04/2009   No results found for: HGBA1C Lab Results  Component Value Date   IWOEHOZY24 825 04/23/2018   Lab Results  Component Value Date   TSH 0.729 12/09/2014      ASSESSMENT AND PLAN 43 y.o. year old female  has a past medical history of Anemia (2007), Arm DVT (deep venous thromboembolism), acute [facility]) ( September 23, 2009, January 05, 2010), Asthma, Depression, H/O cesarean section (  12/21/2012), Hypertension, Leukopenia (2008), Lung nodule ( June 10, 2008), Pulmonary embolism [facility]) ( September 07, 2005), and Schizophrenia [facility]). here with ***   I spent 15 minutes with the patient. 50% of this time was spent    Ward Givens, MSN, NP-C 03/22/2019, 12:10 PM [facility] [address], [place], [place] [address] [phone]

## 2019-03-30 ENCOUNTER — Ambulatory Visit: Payer: Medicaid Other | Admitting: Sports Medicine

## 2019-04-07 ENCOUNTER — Other Ambulatory Visit: Payer: Self-pay

## 2019-04-07 ENCOUNTER — Ambulatory Visit (INDEPENDENT_AMBULATORY_CARE_PROVIDER_SITE_OTHER): Payer: Medicaid Other | Admitting: Pharmacist

## 2019-04-07 DIAGNOSIS — Z7901 Long term (current) use of anticoagulants: Secondary | ICD-10-CM | POA: Diagnosis present

## 2019-04-07 DIAGNOSIS — Z5181 Encounter for therapeutic drug level monitoring: Secondary | ICD-10-CM | POA: Diagnosis not present

## 2019-04-07 DIAGNOSIS — D6859 Other primary thrombophilia: Secondary | ICD-10-CM | POA: Diagnosis not present

## 2019-04-07 LAB — POCT INR: INR: 1.9 — AB (ref 2.0–3.0)

## 2019-04-07 NOTE — Progress Notes (Signed)
Anticoagulation Management Michele Gillespie is a 43 y.o. female who reports to the clinic for monitoring of warfarin treatment.    Indication: Hypercoagulable State; Long term current use of anticoagulant.    Duration: indefinite Supervising physician: Aldine Contes  Anticoagulation Clinic Visit History: Patient does not report signs/symptoms of bleeding or thromboembolism  Other recent changes: No diet, medications, lifestyle changes.  Anticoagulation Episode Summary    Current INR goal:  2.0-3.0  TTR:  49.7 % (6.8 y)  Next INR check:  05/03/2019  INR from last check:  1.9 (04/07/2019)  Weekly max warfarin dose:    Target end date:  Indefinite  INR check location:  Anticoagulation Clinic  Preferred lab:    Send INR reminders to:     Indications   HYPERCOAGULABLE STATE PRIMARY [D68.59] Long term current use of anticoagulant [Z79.01]       Comments:          No Known Allergies  Current Outpatient Medications:  .  albuterol (PROVENTIL HFA) 108 (90 Base) MCG/ACT inhaler, INHALE 2 PUFFS INTO THE LUNGS EVERY 6 HOURS AS NEEDED FOR WHEEZING. FOR SHORTNESS OF BREATH (Patient taking differently: Inhale 2 puffs into the lungs every 6 (six) hours as needed for wheezing or shortness of breath. ), Disp: 6.7 Inhaler, Rfl: 0 .  amLODipine (NORVASC) 5 MG tablet, Take 1 tablet (5 mg total) by mouth daily., Disp: 30 tablet, Rfl: 11 .  ARIPiprazole (ABILIFY) 10 MG tablet, Take 1 tablet (10 mg total) by mouth daily., Disp: 30 tablet, Rfl: 0 .  HYDROcodone-acetaminophen (NORCO/VICODIN) 5-325 MG tablet, Take 1 tablet by mouth every 6 (six) hours as needed for severe pain., Disp: 8 tablet, Rfl: 0 .  hydroxychloroquine (PLAQUENIL) 200 MG tablet, Take 400 mg by mouth daily. , Disp: , Rfl:  .  methocarbamol (ROBAXIN) 500 MG tablet, Take 1 tablet (500 mg total) by mouth 2 (two) times daily., Disp: 20 tablet, Rfl: 0 .  omeprazole (PRILOSEC) 20 MG capsule, TAKE 1 CAPSULE BY MOUTH EVERY DAY (Patient taking  differently: Take 20 mg by mouth daily. ), Disp: 30 capsule, Rfl: 3 .  predniSONE (DELTASONE) 50 MG tablet, One tab PO daily for 5 days., Disp: 5 tablet, Rfl: 0 .  warfarin (COUMADIN) 5 MG tablet, Take 2.5 tablets (12.5 mg total) by mouth daily at 6 PM., Disp: 76 tablet, Rfl: 2 Past Medical History:  Diagnosis Date  . Anemia 2007    microcytic anemia, baseline hemoglobin 10-11, MCV at baseline 72-77, secondary to iron deficiency  . Arm DVT (deep venous thromboembolism), acute [facility])  September 23, 2009, January 05, 2010    Doppler study significant with indeterminant age DVT involving the left upper extremity, Doppler performed January 05, 2010 consistent with acute DVT involving the left upper extremity  . Asthma   . Depression   . H/O cesarean section 12/21/2012   5 CS, had BTL at last procedure    . Hypertension   . Leukopenia 2008    unclear etiology baseline WBC  2.8-3.7  . Lung nodule  June 10, 2008    stable tiny noduke noted along the minor fissure of the right lung on CT angio September 11, 09 -  stable for 2 years and consistent with benign disease  . Pulmonary embolism [facility])  September 07, 2005    her CT angiogram - positive for pulmonary emboli to several branches of the right lower lobe- relatively small clot burden, clear lung; patient started on Coumadin; CT angiogram on  January 10, 2006 showed resolution of previously seen pulmonary emboli with minimal basilar atelectasis  . Schizophrenia [facility])    Social History   Socioeconomic History  . Marital status: Married    Spouse name: Not on file  . Number of children: 5  . Years of education: In school  . Highest education level: Not on file  Occupational History  . Occupation: Disabled  Social Needs  . Financial resource strain: Not on file  . Food insecurity    Worry: Not on file    Inability: Not on file  . Transportation needs    Medical: Not on file    Non-medical: Not on file  Tobacco Use  . Smoking status: Former  Smoker    Packs/day: 0.25    Years: 30.00    Pack years: 7.50    Types: Cigarettes  . Smokeless tobacco: Never Used  . Tobacco comment: Quit x 3 months. Restarted 3 weeks ago.   Substance and Sexual Activity  . Alcohol use: Never    Alcohol/week: 0.0 standard drinks    Frequency: Never    Comment: Quit x 4 yrs.  . Drug use: Not Currently    Types: Marijuana  . Sexual activity: Yes    Birth control/protection: None    Comment: tubal  Lifestyle  . Physical activity    Days per week: Not on file    Minutes per session: Not on file  . Stress: Not on file  Relationships  . Social Herbalist on phone: Not on file    Gets together: Not on file    Attends religious service: Not on file    Active member of club or organization: Not on file    Attends meetings of clubs or organizations: Not on file    Relationship status: Not on file  Other Topics Concern  . 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 Hypertension Mother   . Congestive Heart Failure Mother   . Diabetes Father   . Birth defects Maternal Aunt   . Birth defects Maternal Uncle   . Diabetes Paternal Grandmother   . Breast cancer Sister   . Breast cancer Sister     ASSESSMENT Recent Results: The most recent result is correlated with 85 mg per week: Lab Results  Component Value Date   INR 1.9 (A) 04/07/2019   INR 3.3 (A) 02/16/2019   INR 3.3 (A) 02/08/2019    Anticoagulation Dosing: Description   Patient instructed to take 2 and 1/2 of her 5mg  peach-colored warfarin tablets by mouth, once-daily at 6PM.      INR today: Subtherapeutic  PLAN Weekly dose was increased by 3% to 87.5 mg per week  Patient Instructions  Patient instructed to  take medications as defined in the Anti-coagulation Track section of this encounter.  Patient instructed to take today's dose.  Patient instructed to take 2 and 1/2 of her 5mg  peach-colored warfarin tablets by mouth, once-daily at [facility]  Patient verbalized understanding of these instructions.     Patient advised to contact clinic or seek medical attention if signs/symptoms of bleeding or thromboembolism occur.  Patient verbalized understanding by repeating  back information and was advised to contact me if further medication-related questions arise. Patient was also provided an information handout.  Follow-up Return in 26 days (on 05/03/2019) for Follow up INR.  Pennie Banter, PharmD, CPP  15 minutes spent face-to-face with the patient during the encounter. 50% of time spent on education, including signs/sx bleeding and clotting, as well as food and drug interactions with warfarin. 50% of time was spent on fingerprick POC INR sample collection,processing, results determination, and documentation in [email or web address]

## 2019-04-07 NOTE — Patient Instructions (Signed)
Patient instructed to take medications as defined in the Anti-coagulation Track section of this encounter.  Patient instructed to take today's dose.  Patient instructed to take 2 and 1/2 of her 5mg  peach-colored warfarin tablets by mouth, once-daily at [facility].  Patient verbalized understanding of these instructions.

## 2019-04-08 NOTE — Progress Notes (Signed)
INTERNAL MEDICINE TEACHING ATTENDING ADDENDUM - Aldine Contes M.D  Duration- indefinite, Indication- PE, DVT, INR- sub therapeutic. Agree with pharmacy recommendations as outlined in their note.

## 2019-04-18 ENCOUNTER — Other Ambulatory Visit: Payer: Self-pay | Admitting: Pharmacist

## 2019-04-18 DIAGNOSIS — D6859 Other primary thrombophilia: Secondary | ICD-10-CM

## 2019-04-18 DIAGNOSIS — Z7901 Long term (current) use of anticoagulants: Secondary | ICD-10-CM

## 2019-04-18 MED ORDER — WARFARIN SODIUM 5 MG PO TABS: 12.5000 mg | ORAL_TABLET | Freq: Every day | ORAL | 2 refills | Status: DC

## 2019-04-20 ENCOUNTER — Emergency Department (HOSPITAL_COMMUNITY)
Admission: EM | Admit: 2019-04-20 | Discharge: 2019-04-21 | Disposition: A | Discharge status: Home / Self Care | Payer: Medicaid Other | Attending: Emergency Medicine | Admitting: Emergency Medicine

## 2019-04-20 ENCOUNTER — Emergency Department (HOSPITAL_COMMUNITY): Payer: Medicaid Other

## 2019-04-20 ENCOUNTER — Encounter (HOSPITAL_COMMUNITY): Payer: Self-pay

## 2019-04-20 ENCOUNTER — Other Ambulatory Visit: Payer: Self-pay

## 2019-04-20 DIAGNOSIS — I10 Essential (primary) hypertension: Secondary | ICD-10-CM | POA: Diagnosis not present

## 2019-04-20 DIAGNOSIS — Z79899 Other long term (current) drug therapy: Secondary | ICD-10-CM | POA: Diagnosis not present

## 2019-04-20 DIAGNOSIS — Z86718 Personal history of other venous thrombosis and embolism: Secondary | ICD-10-CM | POA: Diagnosis not present

## 2019-04-20 DIAGNOSIS — Z86711 Personal history of pulmonary embolism: Secondary | ICD-10-CM | POA: Diagnosis not present

## 2019-04-20 DIAGNOSIS — Z7901 Long term (current) use of anticoagulants: Secondary | ICD-10-CM | POA: Insufficient documentation

## 2019-04-20 DIAGNOSIS — M546 Pain in thoracic spine: Secondary | ICD-10-CM | POA: Insufficient documentation

## 2019-04-20 DIAGNOSIS — F17210 Nicotine dependence, cigarettes, uncomplicated: Secondary | ICD-10-CM | POA: Diagnosis not present

## 2019-04-20 LAB — CBC WITH DIFFERENTIAL/PLATELET
Abs Immature Granulocytes: 0.01 10*3/uL (ref 0.00–0.07)
Basophils Absolute: 0 10*3/uL (ref 0.0–0.1)
Basophils Relative: 1 %
Eosinophils Absolute: 0.1 10*3/uL (ref 0.0–0.5)
Eosinophils Relative: 1 %
HCT: 42.4 % (ref 36.0–46.0)
Hemoglobin: 13.3 g/dL (ref 12.0–15.0)
Immature Granulocytes: 0 %
Lymphocytes Relative: 55 %
Lymphs Abs: 2.1 10*3/uL (ref 0.7–4.0)
MCH: 24.9 pg — ABNORMAL LOW (ref 26.0–34.0)
MCHC: 31.4 g/dL (ref 30.0–36.0)
MCV: 79.3 fL — ABNORMAL LOW (ref 80.0–100.0)
Monocytes Absolute: 0.3 10*3/uL (ref 0.1–1.0)
Monocytes Relative: 9 %
Neutro Abs: 1.3 10*3/uL — ABNORMAL LOW (ref 1.7–7.7)
Neutrophils Relative %: 34 %
Platelets: 187 10*3/uL (ref 150–400)
RBC: 5.35 MIL/uL — ABNORMAL HIGH (ref 3.87–5.11)
RDW: 14.7 % (ref 11.5–15.5)
WBC: 3.8 10*3/uL — ABNORMAL LOW (ref 4.0–10.5)
nRBC: 0 % (ref 0.0–0.2)

## 2019-04-20 LAB — BASIC METABOLIC PANEL
Anion gap: 8 (ref 5–15)
BUN: 11 mg/dL (ref 6–20)
CO2: 21 mmol/L — ABNORMAL LOW (ref 22–32)
Calcium: 9 mg/dL (ref 8.9–10.3)
Chloride: 109 mmol/L (ref 98–111)
Creatinine, Ser: 0.87 mg/dL (ref 0.44–1.00)
GFR calc Af Amer: >60 mL/min (ref >60)
GFR calc non Af Amer: >60 mL/min (ref >60)
Glucose, Bld: 105 mg/dL — ABNORMAL HIGH (ref 70–99)
Potassium: 3.6 mmol/L (ref 3.5–5.1)
Sodium: 138 mmol/L (ref 135–145)

## 2019-04-20 LAB — PROTIME-INR
INR: 2.9 — ABNORMAL HIGH (ref 0.8–1.2)
Prothrombin Time: 29.7 seconds — ABNORMAL HIGH (ref 11.4–15.2)

## 2019-04-20 LAB — TROPONIN I (HIGH SENSITIVITY): Troponin I (High Sensitivity): 3 ng/L (ref <18)

## 2019-04-20 MED ORDER — METHOCARBAMOL 500 MG PO TABS
1000.0000 mg | ORAL_TABLET | Freq: Once | ORAL | Status: AC
  Administered 2019-04-20: 1000 mg via ORAL
  Filled 2019-04-20: qty 2

## 2019-04-20 MED ORDER — IOHEXOL 350 MG/ML SOLN
75.0000 mL | Freq: Once | INTRAVENOUS | Status: AC | PRN
  Administered 2019-04-20: 75 mL via INTRAVENOUS

## 2019-04-20 MED ORDER — LIDOCAINE 5 % EX PTCH
1.0000 | MEDICATED_PATCH | CUTANEOUS | Status: DC
  Administered 2019-04-20: 23:00:00 1 via TRANSDERMAL
  Filled 2019-04-20: qty 1
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
## 2019-04-21 MED ORDER — METHOCARBAMOL 500 MG PO TABS: 500.0000 mg | ORAL_TABLET | Freq: Two times a day (BID) | ORAL | 0 refills | Status: DC

## 2019-04-21 NOTE — ED Notes (Signed)
All appropriate discharge materials reviewed with patient at length. Time for questions provided. Pt denies any further questions at this time. Verbalizes understanding of all provided materials.  

## 2019-04-21 NOTE — Discharge Instructions (Signed)
Your CT scan did not show any evidence of a blood clot today.  This is likely worsened back pain since he returned to work.  I do think it would be beneficial for you to follow-up with your primary care doctor as well as an orthopedist at this point.  Continue using Tylenol as well as prescribed Robaxin you can also use over-the-counter lidocaine patches and continue to follow-up with your chiropractor.  Return if you develop severe numbness or weakness in your extremities, loss of bowel or bladder control.  Severe chest pain, shortness of breath or any other new or concerning symptoms.

## 2019-04-23 ENCOUNTER — Ambulatory Visit: Payer: Medicaid Other

## 2019-04-29 ENCOUNTER — Encounter: Payer: Self-pay | Admitting: Internal Medicine

## 2019-04-29 ENCOUNTER — Ambulatory Visit: Payer: Medicaid Other | Admitting: Internal Medicine

## 2019-04-29 ENCOUNTER — Other Ambulatory Visit: Payer: Self-pay

## 2019-04-29 DIAGNOSIS — M546 Pain in thoracic spine: Secondary | ICD-10-CM | POA: Diagnosis not present

## 2019-04-29 DIAGNOSIS — G8911 Acute pain due to trauma: Secondary | ICD-10-CM | POA: Diagnosis not present

## 2019-04-29 DIAGNOSIS — S22000A Wedge compression fracture of unspecified thoracic vertebra, initial encounter for closed fracture: Secondary | ICD-10-CM

## 2019-04-29 DIAGNOSIS — M545 Low back pain: Secondary | ICD-10-CM

## 2019-04-29 MED ORDER — LIDOCAINE 4 % EX PTCH: 1.0000 | MEDICATED_PATCH | Freq: Every day | CUTANEOUS | 2 refills | Status: DC | PRN

## 2019-04-29 MED ORDER — METHOCARBAMOL 500 MG PO TABS: 500.0000 mg | ORAL_TABLET | Freq: Every day | ORAL | 0 refills | Status: DC | PRN
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
## 2019-04-29 NOTE — Patient Instructions (Addendum)
Thank you for allowing [place] to care for you  For your ongoing back pain - Continue with muscle relaxant as needed for spasm - Continue with lidocaine patches as needed - We have placed a referral to physical therapy for you - You can continue with tylenol as needed - Follow up with orthopedic doctor as scheduled  Follow up with [place] in about 3-6 months

## 2019-04-30 ENCOUNTER — Encounter: Payer: Self-pay | Admitting: Internal Medicine
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
## 2019-04-30 NOTE — Progress Notes (Signed)
Internal Medicine Clinic Attending  Case discussed with Dr. Melvin  at the time of the visit.  We reviewed the resident's history and exam and pertinent patient test results.  I agree with the assessment, diagnosis, and plan of care documented in the resident's note.  

## 2019-05-03 ENCOUNTER — Ambulatory Visit: Payer: Medicaid Other

## 2019-05-04 NOTE — Addendum Note (Signed)
Addended by: Neva Seat B on: 05/04/2019 09:40 AM   Modules accepted: Orders

## 2019-05-06 ENCOUNTER — Other Ambulatory Visit: Payer: Self-pay | Admitting: Internal Medicine

## 2019-05-06 DIAGNOSIS — I10 Essential (primary) hypertension: Secondary | ICD-10-CM

## 2019-05-07 NOTE — Telephone Encounter (Signed)
Refill approved.

## 2019-05-10 ENCOUNTER — Encounter: Payer: Self-pay | Admitting: *Deleted

## 2019-05-10 ENCOUNTER — Ambulatory Visit: Payer: Medicaid Other

## 2019-05-19 ENCOUNTER — Other Ambulatory Visit: Payer: Self-pay | Admitting: Family Medicine

## 2019-05-19 ENCOUNTER — Other Ambulatory Visit: Payer: Self-pay | Admitting: Internal Medicine

## 2019-05-19 DIAGNOSIS — Z1231 Encounter for screening mammogram for malignant neoplasm of breast: Secondary | ICD-10-CM

## 2019-05-24 ENCOUNTER — Ambulatory Visit: Payer: Medicaid Other

## 2019-05-24 ENCOUNTER — Other Ambulatory Visit: Payer: Self-pay

## 2019-05-24 DIAGNOSIS — Z5181 Encounter for therapeutic drug level monitoring: Secondary | ICD-10-CM | POA: Diagnosis not present

## 2019-05-24 DIAGNOSIS — D6859 Other primary thrombophilia: Secondary | ICD-10-CM | POA: Diagnosis not present

## 2019-05-24 DIAGNOSIS — Z7901 Long term (current) use of anticoagulants: Secondary | ICD-10-CM | POA: Diagnosis present

## 2019-05-24 LAB — POCT INR: INR: 2.5 (ref 2.0–3.0)

## 2019-05-24 NOTE — Patient Instructions (Signed)
Patient instructed to take medications as defined in the Anti-coagulation Track section of this encounter.  Patient instructed to take today's dose.  Patient instructed to take 2 and 1/2 of her 5mg peach-colored warfarin tablets by mouth, once-daily at 6PM.   Patient verbalized understanding of these instructions.   
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
## 2019-05-25 NOTE — Progress Notes (Signed)
I reviewed the anticoagulation note.  Patient on coumadin for hypercoag disorder.  INR at goal.

## 2019-06-11 NOTE — Addendum Note (Signed)
Addended by: Hulan Fray on: 06/11/2019 01:54 PM   Modules accepted: Orders

## 2019-06-14 ENCOUNTER — Ambulatory Visit: Payer: Medicaid Other

## 2019-07-06 ENCOUNTER — Ambulatory Visit
Admission: RE | Admit: 2019-07-06 | Discharge: 2019-07-06 | Disposition: A | Discharge status: Home / Self Care | Payer: Medicaid Other | Source: Ambulatory Visit | Attending: Family Medicine | Admitting: Family Medicine

## 2019-07-06 ENCOUNTER — Other Ambulatory Visit: Payer: Self-pay

## 2019-07-06 DIAGNOSIS — Z1231 Encounter for screening mammogram for malignant neoplasm of breast: Secondary | ICD-10-CM

## 2019-07-07 ENCOUNTER — Other Ambulatory Visit: Payer: Self-pay | Admitting: Pharmacist

## 2019-07-07 DIAGNOSIS — D6859 Other primary thrombophilia: Secondary | ICD-10-CM

## 2019-07-07 DIAGNOSIS — Z7901 Long term (current) use of anticoagulants: Secondary | ICD-10-CM

## 2019-07-07 MED ORDER — WARFARIN SODIUM 5 MG PO TABS: 12.5000 mg | ORAL_TABLET | Freq: Every day | ORAL | 2 refills | Status: DC

## 2019-07-11 ENCOUNTER — Other Ambulatory Visit: Payer: Self-pay | Admitting: Internal Medicine

## 2019-07-12 NOTE — Telephone Encounter (Signed)
Refill approved.

## 2019-07-15 ENCOUNTER — Other Ambulatory Visit: Payer: Self-pay

## 2019-07-15 ENCOUNTER — Ambulatory Visit: Payer: Medicaid Other | Admitting: Pharmacist

## 2019-07-15 DIAGNOSIS — Z7901 Long term (current) use of anticoagulants: Secondary | ICD-10-CM | POA: Diagnosis present

## 2019-07-15 DIAGNOSIS — D6859 Other primary thrombophilia: Secondary | ICD-10-CM

## 2019-07-15 DIAGNOSIS — Z5181 Encounter for therapeutic drug level monitoring: Secondary | ICD-10-CM

## 2019-07-15 LAB — POCT INR: INR: 3 (ref 2.0–3.0)
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
## 2019-07-15 NOTE — Progress Notes (Signed)
INTERNAL MEDICINE TEACHING ATTENDING ADDENDUM - Victor Granados M.D  Duration- indefinite, Indication- PE, DVT, INR- therapeutic. Agree with pharmacy recommendations as outlined in their note.      

## 2019-07-15 NOTE — Patient Instructions (Signed)
Patient instructed to take medications as defined in the Anti-coagulation Track section of this encounter.  Patient instructed to take today's dose.  Patient instructed to take 2 and 1/2 of her 5mg peach-colored warfarin tablets by mouth, once-daily at 6PM.   Patient verbalized understanding of these instructions.   

## 2019-07-27 ENCOUNTER — Other Ambulatory Visit: Payer: Self-pay

## 2019-07-27 ENCOUNTER — Emergency Department (HOSPITAL_COMMUNITY)
Admission: EM | Admit: 2019-07-27 | Discharge: 2019-07-28 | Disposition: A | Discharge status: Home / Self Care | Payer: Medicaid Other | Attending: Emergency Medicine | Admitting: Emergency Medicine

## 2019-07-27 ENCOUNTER — Encounter (HOSPITAL_COMMUNITY): Payer: Self-pay | Admitting: Emergency Medicine

## 2019-07-27 DIAGNOSIS — Z79899 Other long term (current) drug therapy: Secondary | ICD-10-CM | POA: Insufficient documentation

## 2019-07-27 DIAGNOSIS — I10 Essential (primary) hypertension: Secondary | ICD-10-CM | POA: Insufficient documentation

## 2019-07-27 DIAGNOSIS — Z87891 Personal history of nicotine dependence: Secondary | ICD-10-CM | POA: Diagnosis not present

## 2019-07-27 DIAGNOSIS — Z7901 Long term (current) use of anticoagulants: Secondary | ICD-10-CM | POA: Diagnosis not present

## 2019-07-27 DIAGNOSIS — Z20828 Contact with and (suspected) exposure to other viral communicable diseases: Secondary | ICD-10-CM | POA: Diagnosis not present

## 2019-07-27 DIAGNOSIS — R05 Cough: Secondary | ICD-10-CM | POA: Insufficient documentation

## 2019-07-27 DIAGNOSIS — J45909 Unspecified asthma, uncomplicated: Secondary | ICD-10-CM | POA: Diagnosis not present

## 2019-07-27 DIAGNOSIS — J029 Acute pharyngitis, unspecified: Secondary | ICD-10-CM | POA: Insufficient documentation

## 2019-07-27 DIAGNOSIS — Z20822 Contact with and (suspected) exposure to covid-19: Secondary | ICD-10-CM

## 2019-07-27 LAB — GROUP A STREP BY PCR: Group A Strep by PCR: NOT DETECTED

## 2019-07-27 MED ORDER — ACETAMINOPHEN 325 MG PO TABS
650.0000 mg | ORAL_TABLET | Freq: Once | ORAL | Status: AC | PRN
  Administered 2019-07-27: 650 mg via ORAL
  Filled 2019-07-27: qty 2

## 2019-07-27 NOTE — ED Triage Notes (Signed)
Patient reports sore throat with productive cough and chest congestion onset yesterday with fever .

## 2019-07-28 LAB — SARS CORONAVIRUS 2 (TAT 6-24 HRS): SARS Coronavirus 2: NEGATIVE
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
## 2019-07-28 NOTE — Discharge Instructions (Addendum)
You may alternate Tylenol 1000 mg every 6 hours as needed for pain and fever and Ibuprofen 800 mg every 8 hours as needed for pain and fever.  Please take Ibuprofen with food.  Please rest and increase your fluid intake.  Your strep test was negative today.  You may follow-up on the results of your Covid test through my chart.  If your test is positive, you will need to quarantine for at least 10 days after the onset of symptoms and be fever free for 3 full days before coming out of quarantine.  If you develop chest pressure or tightness, shortness of breath, vomiting and cannot stop, feel like you are going to pass out or do pass out, have blue lips or blue fingertips, feel like you are working hard to breathe, please return to the emergency department.

## 2019-07-28 NOTE — ED Notes (Signed)
Patient verbalizes understanding of discharge instructions. Opportunity for questioning and answers were provided. Armband removed by staff, pt discharged from ED.  

## 2019-08-02 ENCOUNTER — Telehealth: Payer: Self-pay | Admitting: Neurology

## 2019-08-02 NOTE — Telephone Encounter (Signed)
If the patient has not made contact with a psychiatrist on the next visit, we may try some of these resources:  [facility] [phone] [place] [facility] [place] [phone]  [facility] [place],  [phone]  [facility] [phone]  [facility]  Dr. Mayme Genta [phone]  Marcy Salvo, MD [phone] , phd [place]  Dr Brayton Caves [phone]

## 2019-08-05 ENCOUNTER — Other Ambulatory Visit: Payer: Self-pay

## 2019-08-05 ENCOUNTER — Encounter: Payer: Self-pay | Admitting: Internal Medicine

## 2019-08-05 ENCOUNTER — Ambulatory Visit (INDEPENDENT_AMBULATORY_CARE_PROVIDER_SITE_OTHER): Payer: Medicaid Other | Admitting: Internal Medicine

## 2019-08-05 VITALS — BP 116/81 | HR 84 | Temp 98.6°F | Ht 64.5 in | Wt 255.6 lb

## 2019-08-05 DIAGNOSIS — G8929 Other chronic pain: Secondary | ICD-10-CM | POA: Diagnosis not present

## 2019-08-05 DIAGNOSIS — J069 Acute upper respiratory infection, unspecified: Secondary | ICD-10-CM | POA: Diagnosis present

## 2019-08-05 DIAGNOSIS — Z7901 Long term (current) use of anticoagulants: Secondary | ICD-10-CM

## 2019-08-05 DIAGNOSIS — I10 Essential (primary) hypertension: Secondary | ICD-10-CM | POA: Diagnosis not present

## 2019-08-05 DIAGNOSIS — Z79899 Other long term (current) drug therapy: Secondary | ICD-10-CM

## 2019-08-05 DIAGNOSIS — F329 Major depressive disorder, single episode, unspecified: Secondary | ICD-10-CM

## 2019-08-05 DIAGNOSIS — Z23 Encounter for immunization: Secondary | ICD-10-CM | POA: Diagnosis not present

## 2019-08-05 DIAGNOSIS — M25561 Pain in right knee: Secondary | ICD-10-CM

## 2019-08-05 DIAGNOSIS — F325 Major depressive disorder, single episode, in full remission: Secondary | ICD-10-CM

## 2019-08-05 DIAGNOSIS — M25569 Pain in unspecified knee: Secondary | ICD-10-CM | POA: Diagnosis not present

## 2019-08-05 DIAGNOSIS — Z0000 Encounter for general adult medical examination without abnormal findings: Secondary | ICD-10-CM

## 2019-08-05 DIAGNOSIS — K219 Gastro-esophageal reflux disease without esophagitis: Secondary | ICD-10-CM

## 2019-08-05 DIAGNOSIS — D6859 Other primary thrombophilia: Secondary | ICD-10-CM | POA: Diagnosis not present

## 2019-08-05 DIAGNOSIS — D708 Other neutropenia: Secondary | ICD-10-CM

## 2019-08-05 MED ORDER — ARIPIPRAZOLE 10 MG PO TABS: 10.0000 mg | ORAL_TABLET | Freq: Every day | ORAL | 0 refills | Status: DC
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
## 2019-08-05 NOTE — Patient Instructions (Addendum)
Thank you for allowing [place] to care for you  For your congestion and cough - Likely due to viral illness - COVID and Strep negative in ED - Continue Mucinex - Start nasal rinse - Cough may persist for weeks - Follow up if you have new fevers  For your knee pain - Given the nature of the pain this is likely a ligament or tendon related pain - Continue with tylenol for know - Referral placed to sports medicine  Flu shot given today  Please follow up in about 6 months   How to Perform a Sinus Rinse A sinus rinse is a home treatment. It rinses your sinuses with a mixture of salt and water (saline solution). Sinuses are air-filled spaces in your skull behind the bones of your face and forehead. They open into your nasal cavity. A sinus rinse can help to clear your nasal cavity. It can clear mucus, dirt, dust, or pollen. You may do a sinus rinse when you have:  A cold.  A virus.  Allergies.  A sinus infection.  A stuffy nose. Talk with your doctor about whether a sinus rinse might help you. What are the risks? A sinus rinse is normally very safe and helpful. However, there are a few risks. These include:  A burning feeling in the sinuses. This may happen if you do not make the saline solution as instructed. Be sure to follow all directions when making the saline solution.  Nasal irritation.  Infection from unclean water. This is rare, but possible. Do not do a sinus rinse if you have had:  Ear or nasal surgery.  An ear infection.  Blocked ears. Supplies needed:  Saline solution or powder.  Distilled or germ-free (sterile) water may be needed to mix with saline powder. ? You may use boiled and cooled tap water. Boil tap water for 5 minutes; cool until it is lukewarm. Use within 24 hours. ? Do not use regular tap water to mix with the saline solution.  Neti pot or nasal rinse bottle. This releases the saline solution into your nose and through your sinuses. You can buy  neti pots and rinse bottles: ? At your local pharmacy. ? At a health food store. ? Online. How to perform a sinus rinse  1. Wash your hands with soap and water. 2. Wash your device using the directions that came with it. 3. Dry your device. 4. Use the solution that comes with your device or one that is sold separately in stores. Follow the mixing directions on the package if you need to mix with sterile or distilled water. 5. Fill your device with the amount of saline solution stated in the device instructions. 6. Stand over a sink and tilt your head sideways over the sink. 7. Place the spout of the device in your upper nostril (the one closer to the ceiling). 8. Gently pour or squeeze the saline solution into your nasal cavity. The liquid should drain to your lower nostril if you are not too stuffed up (congested). 9. While rinsing, breathe through your open mouth. 10. Gently blow your nose to clear any mucus and rinse solution. Blowing too hard may cause ear pain. 11. Repeat in your other nostril. 12. Clean and rinse your device with clean water. 13. Air-dry your device. Talk with your doctor or pharmacist if you have questions about how to do a sinus rinse. Summary  A sinus rinse is a home treatment. It rinses your sinuses with a  mixture of salt and water (saline solution).  A sinus rinse is normally very safe and helpful. Follow all instructions carefully.  Talk with your doctor about whether a sinus rinse might help you. This information is not intended to replace advice given to you by your health care provider. Make sure you discuss any questions you have with your health care provider. Document Released: 04/13/2014 Document Revised: 07/14/2017 Document Reviewed: 07/14/2017 Elsevier Patient Education  2020 [employer].

## 2019-08-08 ENCOUNTER — Encounter: Payer: Self-pay | Admitting: Internal Medicine

## 2019-08-08 NOTE — Assessment & Plan Note (Signed)
Patient reports worsening of chronic posterior knee pain. He pain is an achy pain in center her posterior knee that she states is worsened with flexion of her knee.  She is able to walk comfortable and the pain does not worsen much throughout the day. She does have to support her leg when she sleeps for comfort.  Ligaments intact by exam, negative thessaly, no gross abnormalities on exam, no mass palpated. She is tender to palpation in posterior knee. Unclear etiology at this time, but certainly appears to be musculoskeletal. No palpable mass to indicate baker cyst, though ultrasound may be helpful to rule this out. Will have patient follow up with sports medicine and continue with PRN tylenol for now (would avoid long tern NSAIDs given anticoagulation).  - Referral to Sports Medicine, she may wish to follow up with the doctor she saw previously - PRN Tylenol

## 2019-08-08 NOTE — Assessment & Plan Note (Signed)
Her reflux remains well controlled. Continue current medication, will consider taper in future. - Omeprazole 20mg  Daily

## 2019-08-08 NOTE — Assessment & Plan Note (Signed)
Patient has had ongoing cough productive of clear to greenish sputum and congestion since last week. She has a fever last week, but has not had one in several days. She began to have rib pain with her cough and was seen in the ED on 10/27; she was negative for COVID and Strep at that time. She has been taking Mucinex and PRN tylenol.  Patient likely has a viral URI which appears to be improving. Advised to continue with Mucinex to loosen secretion and to start sinus rinses. She is to follow up with her symptoms worsen or she develops new fevers.

## 2019-08-08 NOTE — Assessment & Plan Note (Signed)
BP remains well controlled at 116/81, will continue current regimen. - Amlodipine 5mg  Daily

## 2019-08-08 NOTE — Assessment & Plan Note (Signed)
Patient state her mood has been stable and she is doing well on Abilify, but does need a refill, which will be provided. We will continue current treatment, could consider IBH in future if patient needs additional support. - Aripiprazole 10mg  Daily

## 2019-08-08 NOTE — Assessment & Plan Note (Signed)
Flu vaccine given.

## 2019-08-08 NOTE — Assessment & Plan Note (Signed)
Patient has been doing well on her current dose of warfarin. She follows with coumadin clinic here in the internal medicine center. Last INR during ED visit last week was 3.0. Continue current doses. - Follow up with coumadin clinic - Continue Warfarin

## 2019-08-11 NOTE — Progress Notes (Signed)
Internal Medicine Clinic Attending  Case discussed with Dr. Melvin  at the time of the visit.  We reviewed the resident's history and exam and pertinent patient test results.  I agree with the assessment, diagnosis, and plan of care documented in the resident's note.  

## 2019-08-16 ENCOUNTER — Other Ambulatory Visit: Payer: Self-pay | Admitting: Pharmacist

## 2019-08-16 ENCOUNTER — Ambulatory Visit: Payer: Medicaid Other | Admitting: Family Medicine

## 2019-08-16 ENCOUNTER — Other Ambulatory Visit: Payer: Self-pay

## 2019-08-16 ENCOUNTER — Telehealth: Payer: Self-pay | Admitting: Pharmacist

## 2019-08-16 ENCOUNTER — Encounter: Payer: Self-pay | Admitting: Internal Medicine

## 2019-08-16 ENCOUNTER — Ambulatory Visit: Payer: Medicaid Other | Admitting: Internal Medicine

## 2019-08-16 DIAGNOSIS — Z5181 Encounter for therapeutic drug level monitoring: Secondary | ICD-10-CM | POA: Diagnosis not present

## 2019-08-16 DIAGNOSIS — D6859 Other primary thrombophilia: Secondary | ICD-10-CM | POA: Diagnosis not present

## 2019-08-16 DIAGNOSIS — Z7901 Long term (current) use of anticoagulants: Secondary | ICD-10-CM

## 2019-08-16 DIAGNOSIS — M25561 Pain in right knee: Secondary | ICD-10-CM

## 2019-08-16 HISTORY — DX: Pain in right knee: M25.561

## 2019-08-16 LAB — POCT INR: INR: 2.5 (ref 2.0–3.0)

## 2019-08-16 MED ORDER — WARFARIN SODIUM 5 MG PO TABS: 12.5000 mg | ORAL_TABLET | Freq: Every day | ORAL | 2 refills | Status: DC

## 2019-08-16 NOTE — Patient Instructions (Signed)
Your pain is due to arthritis. These are the different medications you can take for this: Tylenol 500mg  1-2 tabs three times a day for pain. Capsaicin, aspercreme, or biofreeze topically up to four times a day may also help with pain. Some supplements that may help for arthritis: Boswellia extract, curcumin, pycnogenol Cortisone injections are an option. It's important that you continue to stay active. Straight leg raises, knee extensions 3 sets of 10 once a day (add ankle weight if these become too easy). Consider physical therapy to strengthen muscles around the joint that hurts to take pressure off of the joint itself. Shoe inserts with good arch support may be helpful. Heat or ice 15 minutes at a time 3-4 times a day as needed to help with pain. Water aerobics and cycling with low resistance are the best two types of exercise for arthritis though any exercise is ok as long as it doesn't worsen the pain. Follow up with me in 1 month. Call me sooner if you want to do physical therapy, injection, or x-rays though.

## 2019-08-16 NOTE — Telephone Encounter (Signed)
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 She indicated that they in fact stipulated the person "managing her warfarin" though I told her as her PCP it would be you Dr. Trilby Drummer. That having been said, she insisted I do this as it was needed today. She in fact had texted me over the weekend asking for this. The patient was provided a letter bearing the [facility] letterhead indicating her index event (December 2007) Evidence of compliance (as demonstrated by her time in target range of 52%/7.1 years as well as her refill and RTC history of compliance) and finally if it was determined she was "medically sound" with this diagnosis and drug therapy to drive, for which I indicated "yes." Dr. Lynnae January has/had asked you to complete, but again, the patient indicated she needed today. You no longer have to provide this letter of documentation. Thank you!

## 2019-08-16 NOTE — Progress Notes (Signed)
Michele Gillespie - 43 y.o. female MRN [id number]  Date of birth: July 23, 1976  SUBJECTIVE:   CC: Chronic right knee pain  HPI: Michele Gillespie is a 43 year old female with lupus and primary hypercoagulable state on anticoagulation presenting for evaluation of chronic right knee pain:  Right knee pain: States her symptomatology initially started 1 year ago when she felt that she had "poor circulation "in this knee, given the sensation of weakness in the joint.  She did not start experiencing pain in this knee until the past few months, describes an achy pain in her posterior knee that will sometimes radiate to the lateral aspect of her shin.  Denies any locking, clicking, numbness/tingling.  Sometimes feels like she will hear a popping sensation, painless.  She also feels like her knee has been giving out on her, has a history of numerous falls.  Most recent fall 2 weeks ago when she was walking up the stairs and felt like her knee gave out.  Pain is the worst with knee flexion/extension, feels like she cannot lift her foot up to tie her shoe or clean her legs.  Denies any back or hip pains.  She has been trying to elevate her knee, Aspercreme/Salonpas, and icing/heat without any improvement.  Denies any previous surgeries on this knee.  Knee x-ray in 2015 unremarkable.  Additionally had lumbar x-rays in 01/2019 that were unremarkable as well.   ROS: No unexpected weight loss, fever, chills, swelling,numbness/tingling, redness, otherwise see HPI   PMHx - reviewed.  PSHx - reviewed.   FHx - reviewed.  Social Hx -reviewed.  Medications - reviewed   PHYSICAL EXAM:  VS: BP:(!) 132/94  HR: bpm  TEMP: ( )  RESP:   HT:5' 4.5" (163.8 cm)   WT:254 lb (115.2 kg)  BMI:42.94 PHYSICAL EXAM: Gen: NAD, alert, cooperative with exam, well-appearing Resp: non-labored Skin: no rashes, normal turgor  Neuro: no gross deficits.  Psych:  alert and oriented MSK: Normal gait.  No tenderness to lumbar spinous processes.    Right Knee:  No gross deformity, ecchymoses, swelling.  No joint effusion. Tenderness to palpation along medial and lateral joint lines, and posterior aspect of knee without palpation of mass or fluid-filled collection in posterior region. FROM with 5/5 strength through hip and knee, however reports pain in posterior region with knee flexion/extension. Negative ant/post drawers. Negative valgus/varus testing. Negative lachmans.  Negative McMurray's, Thessaly's. NV intact distally.  Limited ultrasound of the right knee demonstrating no Baker's cyst or fluid collection.  Venous structures compressible.  Left Knee:  No gross deformity, ecchymoses, swelling. No TTP. FROM with 5/5 strength in LE Negative ant/post drawers. Negative valgus/varus testing.  Negative Thessaly's. NV intact distally.   ASSESSMENT & PLAN:   Posterior right knee pain Chronic in nature, associated with intermittent popping and "giving out" sensation.  Suspect likely in the setting of mild arthritic changes.  Reassuringly her ligamentous and meniscal structures appear intact through physical exam today, making these less likely etiology.  Could also consider popliteal tendinopathy, however clinical presentation appears more consistent with above.  Baker's cyst ruled out via ultrasound.  Recommended conservative therapy including ice/heat, Tylenol, topical anti-inflammatories (avoiding oral NSAIDs due to anticoagulation), and home exercises/stretching.  Offered knee injection today, however patient declined. Could consider formal PT in the future especially in the hopes of reducing the amount of falls she experiences (which may multifactorial).   Follow-up in 1 month or sooner if needed.   Patriciaann Clan, DO Family Medicine  PGY-2

## 2019-08-16 NOTE — Assessment & Plan Note (Addendum)
Chronic in nature, associated with intermittent popping and "giving out" sensation.  Suspect likely in the setting of mild arthritic changes.  Reassuringly her ligamentous and meniscal structures appear intact through physical exam today, making these less likely etiology.  Could also consider popliteal tendinopathy, however clinical presentation appears more consistent with above.  Baker's cyst ruled out via ultrasound.  Recommended conservative therapy including ice/heat, Tylenol, topical anti-inflammatories (avoiding oral NSAIDs due to anticoagulation), and home exercises/stretching.  Offered knee injection today, however patient declined. Could consider formal PT in the future especially in the hopes of reducing the amount of falls she experiences (which may multifactorial).
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
## 2019-08-16 NOTE — Patient Instructions (Signed)
Patient instructed to take medications as defined in the Anti-coagulation Track section of this encounter.  Patient instructed to take today's dose.  Patient instructed to take 2 and 1/2 of her 5mg peach-colored warfarin tablets by mouth, once-daily at 6PM.   Patient verbalized understanding of these instructions.   
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
## 2019-08-17 ENCOUNTER — Encounter: Payer: Self-pay | Admitting: Family Medicine

## 2019-08-17 NOTE — Telephone Encounter (Signed)
Thank you so much for doing this for her. I will certainly fill out any further documentation she may need.

## 2019-08-17 NOTE — Progress Notes (Signed)
I would be happy to provide the letter if one ends up being needed. It appears Dr. Elie Confer did supply her with the information and note she needed per a message she sent me.

## 2019-09-13 ENCOUNTER — Ambulatory Visit: Payer: Medicaid Other | Admitting: Family Medicine

## 2019-09-13 ENCOUNTER — Other Ambulatory Visit: Payer: Self-pay | Admitting: Pharmacist

## 2019-09-13 ENCOUNTER — Other Ambulatory Visit: Payer: Self-pay

## 2019-09-13 ENCOUNTER — Ambulatory Visit (INDEPENDENT_AMBULATORY_CARE_PROVIDER_SITE_OTHER): Payer: Medicaid Other | Admitting: Pharmacist

## 2019-09-13 DIAGNOSIS — Z7901 Long term (current) use of anticoagulants: Secondary | ICD-10-CM

## 2019-09-13 DIAGNOSIS — Z5181 Encounter for therapeutic drug level monitoring: Secondary | ICD-10-CM

## 2019-09-13 DIAGNOSIS — D6859 Other primary thrombophilia: Secondary | ICD-10-CM

## 2019-09-13 LAB — POCT INR: INR: 2 (ref 2.0–3.0)

## 2019-09-13 MED ORDER — WARFARIN SODIUM 5 MG PO TABS: ORAL_TABLET | ORAL | 2 refills | Status: DC
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
## 2019-09-13 NOTE — Patient Instructions (Signed)
Patient instructed to take medications as defined in the Anti-coagulation Track section of this encounter.  Patient instructed to take today's dose.  Patient instructed to take instructed to take 2 and 1/2 of her 5mg  peach-colored warfarin tablets by mouth, once-daily at 6PM--EXCEPT on MONDAYS, WEDNESDAYS and FRIDAYS, take three (3) tablets on these days. Patient verbalized understanding of these instructions.

## 2019-09-22 ENCOUNTER — Telehealth: Payer: Self-pay | Admitting: *Deleted

## 2019-09-22 ENCOUNTER — Ambulatory Visit: Payer: Medicaid Other | Admitting: Adult Health

## 2019-09-22 NOTE — Telephone Encounter (Signed)
I called pt.  She states she has been using her cpap, but her download states otherwise.  I relayed that since she could not get off work , she will need to use for another 30 days and then  We can see her. I relayed to call back to reschedule appt.

## 2019-10-11 ENCOUNTER — Other Ambulatory Visit: Payer: Self-pay

## 2019-10-11 ENCOUNTER — Ambulatory Visit: Payer: Medicaid Other | Admitting: Pharmacist

## 2019-10-11 DIAGNOSIS — Z7901 Long term (current) use of anticoagulants: Secondary | ICD-10-CM

## 2019-10-11 DIAGNOSIS — D6859 Other primary thrombophilia: Secondary | ICD-10-CM

## 2019-10-11 DIAGNOSIS — Z5181 Encounter for therapeutic drug level monitoring: Secondary | ICD-10-CM

## 2019-10-11 LAB — POCT INR: INR: 2.8 (ref 2.0–3.0)

## 2019-10-11 MED ORDER — WARFARIN SODIUM 5 MG PO TABS: ORAL_TABLET | ORAL | 2 refills | Status: DC
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
## 2019-10-11 NOTE — Patient Instructions (Signed)
Patient instructed to take medications as defined in the Anti-coagulation Track section of this encounter.  Patient instructed to take today's dose.  Patient instructed to take  2 and 1/2 of her 5mg  peach-colored warfarin tablets by mouth, once-daily at 6PM--EXCEPT on MONDAYS, WEDNESDAYS and FRIDAYS, take three (3) tablets on these days.  Patient verbalized understanding of these instructions.

## 2019-10-15 NOTE — Progress Notes (Signed)
I have reviewed Dr> Groce's note.  Patient is on [facility] for hypercoagulable state and INR at goal.

## 2019-11-16 ENCOUNTER — Ambulatory Visit (INDEPENDENT_AMBULATORY_CARE_PROVIDER_SITE_OTHER): Payer: Medicaid Other | Admitting: Pharmacist

## 2019-11-16 ENCOUNTER — Encounter: Payer: Self-pay | Admitting: Neurology

## 2019-11-16 ENCOUNTER — Other Ambulatory Visit: Payer: Self-pay

## 2019-11-16 ENCOUNTER — Ambulatory Visit: Payer: Medicaid Other | Admitting: Neurology

## 2019-11-16 ENCOUNTER — Telehealth: Payer: Self-pay | Admitting: Neurology

## 2019-11-16 VITALS — BP 132/89 | HR 96 | Temp 97.6°F | Ht 64.5 in | Wt 236.0 lb

## 2019-11-16 DIAGNOSIS — D6859 Other primary thrombophilia: Secondary | ICD-10-CM

## 2019-11-16 DIAGNOSIS — Z5181 Encounter for therapeutic drug level monitoring: Secondary | ICD-10-CM

## 2019-11-16 DIAGNOSIS — Z7901 Long term (current) use of anticoagulants: Secondary | ICD-10-CM | POA: Diagnosis not present

## 2019-11-16 DIAGNOSIS — R413 Other amnesia: Secondary | ICD-10-CM | POA: Diagnosis not present

## 2019-11-16 LAB — POCT INR: INR: 3.3 — AB (ref 2.0–3.0)

## 2019-11-16 NOTE — Telephone Encounter (Signed)
Please see Dr. Jannifer Franklin' note for reference. Please call patient and advise her to get in touch with her DME company for help with repair of her CPAP and replacement supplies.

## 2019-11-16 NOTE — Telephone Encounter (Signed)
I contacted the pt. She reports she has reached out to her DME ( [employer]) and was advised she would have to pay a certain amount before machine could be fixed. Pt sts she cannot afford to fix the machine at this time. I stated I would fwd message to our sleep lab manager to see if she had any other recommendations.

## 2019-11-16 NOTE — Telephone Encounter (Signed)
I will call patient and give her one of these donor machines until she qualifies for new one. I will put in an encounter when I get this done and send to you.

## 2019-11-16 NOTE — Patient Instructions (Signed)
Patient instructed to take medications as defined in the Anti-coagulation Track section of this encounter.  Patient instructed to take today's dose.  Patient instructed to take  2 and 1/2 of her 5mg  peach-colored warfarin tablets by mouth, once-daily at 6PM--EXCEPT on FRIDAYS, take three (3) tablets on Fridays. Patient verbalized understanding of these instructions.

## 2019-11-16 NOTE — Telephone Encounter (Signed)
-----   Message from Kathrynn Ducking, MD sent at 11/16/2019  7:51 AM EST ----- This patient was seen by you over a year ago.  She has been placed on CPAP, she claims that she dropped the machine and fractured the water reservoir, she claims that she cannot afford to fix the machine.  While using the machine, she has had improvement in memory, fatigue, and her headaches improved.  Not sure what can be done about the situation, wanted to let you know about this.
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
## 2019-11-22 ENCOUNTER — Ambulatory Visit: Payer: Medicaid Other

## 2019-12-20 ENCOUNTER — Ambulatory Visit: Payer: Medicaid Other | Admitting: Pharmacist

## 2019-12-20 DIAGNOSIS — Z5181 Encounter for therapeutic drug level monitoring: Secondary | ICD-10-CM

## 2019-12-20 DIAGNOSIS — D6859 Other primary thrombophilia: Secondary | ICD-10-CM

## 2019-12-20 DIAGNOSIS — Z7901 Long term (current) use of anticoagulants: Secondary | ICD-10-CM

## 2019-12-20 DIAGNOSIS — Z86718 Personal history of other venous thrombosis and embolism: Secondary | ICD-10-CM | POA: Diagnosis not present

## 2019-12-20 LAB — POCT INR: INR: 2.8 (ref 2.0–3.0)
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
## 2019-12-20 NOTE — Progress Notes (Signed)
INTERNAL MEDICINE TEACHING ATTENDING ADDENDUM  I agree with pharmacy recommendations as outlined in their note.   Hashem Goynes N Yuriko Portales, MD  

## 2019-12-20 NOTE — Patient Instructions (Signed)
Patient instructed to take medications as defined in the Anti-coagulation Track section of this encounter.  Patient instructed to take today's dose.  Patient instructed to take 2 and 1/2 of her 5mg  peach-colored warfarin tablets by mouth, once-daily at [facility].   Patient verbalized understanding of these instructions.

## 2019-12-27 ENCOUNTER — Ambulatory Visit: Payer: Medicaid Other

## 2020-01-06 ENCOUNTER — Other Ambulatory Visit: Payer: Self-pay | Admitting: Pharmacist

## 2020-01-06 DIAGNOSIS — Z7901 Long term (current) use of anticoagulants: Secondary | ICD-10-CM

## 2020-01-06 DIAGNOSIS — D6859 Other primary thrombophilia: Secondary | ICD-10-CM

## 2020-01-06 MED ORDER — WARFARIN SODIUM 5 MG PO TABS: 12.5000 mg | ORAL_TABLET | Freq: Every day | ORAL | 2 refills | Status: DC

## 2020-01-18 ENCOUNTER — Emergency Department (HOSPITAL_COMMUNITY): Payer: 59

## 2020-01-18 ENCOUNTER — Encounter (HOSPITAL_COMMUNITY): Payer: Self-pay | Admitting: Emergency Medicine

## 2020-01-18 ENCOUNTER — Other Ambulatory Visit: Payer: Self-pay

## 2020-01-18 ENCOUNTER — Emergency Department (HOSPITAL_COMMUNITY)
Admission: EM | Admit: 2020-01-18 | Discharge: 2020-01-18 | Disposition: A | Discharge status: Home / Self Care | Payer: 59 | Attending: Emergency Medicine | Admitting: Emergency Medicine

## 2020-01-18 DIAGNOSIS — Z79899 Other long term (current) drug therapy: Secondary | ICD-10-CM | POA: Insufficient documentation

## 2020-01-18 DIAGNOSIS — Y999 Unspecified external cause status: Secondary | ICD-10-CM | POA: Diagnosis not present

## 2020-01-18 DIAGNOSIS — X58XXXA Exposure to other specified factors, initial encounter: Secondary | ICD-10-CM | POA: Insufficient documentation

## 2020-01-18 DIAGNOSIS — S6991XA Unspecified injury of right wrist, hand and finger(s), initial encounter: Secondary | ICD-10-CM | POA: Diagnosis present

## 2020-01-18 DIAGNOSIS — Y929 Unspecified place or not applicable: Secondary | ICD-10-CM | POA: Diagnosis not present

## 2020-01-18 DIAGNOSIS — Z86718 Personal history of other venous thrombosis and embolism: Secondary | ICD-10-CM | POA: Insufficient documentation

## 2020-01-18 DIAGNOSIS — Y939 Activity, unspecified: Secondary | ICD-10-CM | POA: Diagnosis not present

## 2020-01-18 DIAGNOSIS — I10 Essential (primary) hypertension: Secondary | ICD-10-CM | POA: Insufficient documentation

## 2020-01-18 DIAGNOSIS — F17210 Nicotine dependence, cigarettes, uncomplicated: Secondary | ICD-10-CM | POA: Insufficient documentation
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
## 2020-01-18 NOTE — ED Notes (Signed)
Pt left before discharge vitals obtained.

## 2020-01-18 NOTE — Discharge Instructions (Signed)
While at home please rest, ice, and elevate your finger to reduce the pain/swelling You may take Tylenol as needed for your pain. Avoid NSAIDs given you are on a blood thinner If no improvement in symptoms in a few days time please follow up with Dr. Amedeo Plenty with [facility] for further eval Return to the ED for any worsening symptoms including worsening pain, worsening swelling, redness of your finger, fevers > 100.4

## 2020-01-28 ENCOUNTER — Encounter: Payer: Self-pay | Admitting: *Deleted

## 2020-01-31 ENCOUNTER — Ambulatory Visit (INDEPENDENT_AMBULATORY_CARE_PROVIDER_SITE_OTHER): Payer: 59 | Admitting: Pharmacist

## 2020-01-31 ENCOUNTER — Other Ambulatory Visit: Payer: Self-pay

## 2020-01-31 ENCOUNTER — Ambulatory Visit: Payer: 59 | Admitting: Internal Medicine

## 2020-01-31 ENCOUNTER — Encounter: Payer: Self-pay | Admitting: Internal Medicine

## 2020-01-31 VITALS — BP 128/92 | HR 75 | Temp 98.4°F | Wt 229.9 lb

## 2020-01-31 DIAGNOSIS — D6859 Other primary thrombophilia: Secondary | ICD-10-CM

## 2020-01-31 DIAGNOSIS — F17210 Nicotine dependence, cigarettes, uncomplicated: Secondary | ICD-10-CM | POA: Diagnosis not present

## 2020-01-31 DIAGNOSIS — N63 Unspecified lump in unspecified breast: Secondary | ICD-10-CM | POA: Insufficient documentation

## 2020-01-31 DIAGNOSIS — Z7901 Long term (current) use of anticoagulants: Secondary | ICD-10-CM | POA: Diagnosis not present

## 2020-01-31 DIAGNOSIS — F17200 Nicotine dependence, unspecified, uncomplicated: Secondary | ICD-10-CM

## 2020-01-31 LAB — POCT INR: INR: 2 (ref 2.0–3.0)

## 2020-01-31 NOTE — Patient Instructions (Signed)
Dear Michele Gillespie,  Thank you for allowing [place] to provide your care today. Today we discussed your breast mass    I have ordered breast imaging for you. I will call if any are abnormal.    I will call you with the results of the test once it has been completed.    Should you have any questions or concerns please call the internal medicine clinic at [phone].    Thank you for choosing [facility].   Breast Scan A breast scan is an imaging test that is done to examine dense breast tissue. A breast scan is done using a radioactive material that produces images of the breast. A breast scan is used in people who have breast lesions that resulted from:  Rope-like, lumpy tissue (fibrocystic disease).  Solid, painless lumps (fibroadenoma).  Damaged fatty breast tissue (fat necrosis). A breast scan may also be done to find out the best treatment for people who have breast cancer. Tell a health care provider about:  Any allergies you have.  All medicines you are taking, including vitamins, herbs, eye drops, creams, and over-the-counter medicines.  Any problems you or family members have had with anesthetic medicines.  Any blood disorders you have.  Any surgeries you have had.  Any medical conditions you have.  Whether you are pregnant or may be pregnant, if this applies.  Whether you are breastfeeding, if this applies. What are the risks? Generally, this is a safe procedure. However, problems may occur, including:  Slight discomfort from injection of a radioactive substance.  Allergic reaction to a contrast or radioactive substance used during the procedure. What happens before the procedure?  Ask your health care provider about changing or stopping your regular medicines. This is especially important if you are taking diabetes medicines or blood thinners. What happens during the procedure?  You will be asked to remove all jewelry and clothing from the waist up.  An  IV tube will be inserted into one of your veins.  You will be asked to lie face-down on a table. The breast that will be scanned will be placed through an opening in the table. You may also be asked to get into different positions during the scan.  The radioactive agent will be injected into the IV tube. You may have a slight metallic taste after the injection.  A scanner will be placed over the breast to record the radiation, which produces an image of the breast.  The procedure may be repeated on the other breast.  When the scan is complete, the IV tube will be removed. The procedure may vary among health care providers and hospitals. What happens after the procedure?   You will be asked to get up slowly. This helps you avoid light-headedness after lying flat during the procedure.  Drink enough fluid to keep your urine clear or pale yellow. This helps to wash (flush) the remaining radioactive agent out of your body.  It is up to you to get the results of your procedure. Ask your health care provider, or the department that is doing the procedure, when your results will be ready. Summary  A breast scan is an imaging test that looks at breast lesions. A breast scan may also be done to find out the best treatment for people who have breast cancer.  A radioactive substance is injected through an IV tube, and pictures are taken of the breasts.  After the procedure, drink enough fluid to keep your  urine clear or pale yellow. This helps to wash (flush) the remaining radioactive agent out of your body. This information is not intended to replace advice given to you by your health care provider. Make sure you discuss any questions you have with your health care provider. Document Revised: 11/06/2018 Document Reviewed: 05/14/2016 Elsevier Patient Education  [place].
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
## 2020-01-31 NOTE — Assessment & Plan Note (Addendum)
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 She was in her usual state of health until last Thursday when she noted a palpable mass with discomfort under her left breast. She initially thought it was due to her bra strap but last night noticed increased pain and discomfort around the site. She had a negative screening mammogram within the year but she has two sisters who were diagnosed with breast cancer at early age and was concerned regarding her cancer risk. She has never underwent genetic testing for BRCA but her screening mammograms have been negative. She denies any fevers, chills, nausea, vomiting, myalgias. She denies any breast discharge. Does endorse weight loss, but this has been intentional through diet. She is currently post-menopausal. Last period in 2020.  A/P On exam, noted firm, palpable non-mobile breast mass. 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 No evidence of infectious etiology. Will get diagnostic mammogram to start work-up.  - Diagnostic mammogram first - Breast ultrasound - If BI-RADS >3, can cancel ultrasound and will need referral for surgery for biopsy vs excision.
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
## 2020-01-31 NOTE — Assessment & Plan Note (Addendum)
Presents w/ continued tobacco use. Used to smoke about 3 packs daily but currently weaned down to 1/3 pack daily. She mentions previously having successfully quit couple years ago prior to death of her mother. She mentions having success with patches in the past. She states she currently has some social stressors in her life, including taking care of her paraplegic son. Discussed risks of continued tobacco use including cancer and infections, including mastitis, and continued to encourage weaning down tobacco use.  - C/w tobacco cessation

## 2020-01-31 NOTE — Patient Instructions (Signed)
Patient instructed to take medications as defined in the Anti-coagulation Track section of this encounter.  Patient instructed to take today's dose.  Patient instructed to take  2 and 1/2 of her 5mg  peach-colored warfarin tablets by mouth, once-daily at 6PM--EXCEPT on MONDAYS and THURSDAYS--take three (3) of your 5mg  peach-colored warfarin tablets on Mondays and Thursdays.  Patient verbalized understanding of these instructions.

## 2020-02-01 NOTE — Progress Notes (Signed)
Internal Medicine Clinic Attending ° °Case discussed with Dr. Lee at the time of the visit.  We reviewed the resident’s history and exam and pertinent patient test results.  I agree with the assessment, diagnosis, and plan of care documented in the resident’s note.  °

## 2020-02-03 ENCOUNTER — Ambulatory Visit
Admission: RE | Admit: 2020-02-03 | Discharge: 2020-02-03 | Disposition: A | Discharge status: Home / Self Care | Payer: Medicaid Other | Source: Ambulatory Visit | Attending: Internal Medicine | Admitting: Internal Medicine

## 2020-02-03 ENCOUNTER — Other Ambulatory Visit: Payer: Self-pay

## 2020-02-03 DIAGNOSIS — N63 Unspecified lump in unspecified breast: Secondary | ICD-10-CM

## 2020-02-07 ENCOUNTER — Ambulatory Visit: Payer: Medicaid Other

## 2020-04-01 ENCOUNTER — Telehealth: Payer: Self-pay | Admitting: Pharmacist

## 2020-04-01 DIAGNOSIS — D6859 Other primary thrombophilia: Secondary | ICD-10-CM

## 2020-04-01 DIAGNOSIS — Z7901 Long term (current) use of anticoagulants: Secondary | ICD-10-CM

## 2020-04-01 MED ORDER — WARFARIN SODIUM 5 MG PO TABS: ORAL_TABLET | ORAL | 2 refills | Status: DC

## 2020-04-01 NOTE — Telephone Encounter (Signed)
Patient texted me on Saturday 3-JUL-21 at 1331h requesting refill on warfarin. Advised her I would send warfarin prescription authorization electronically. Patient uses 5mg  strength warfarin tablets. Current regimen is 2&1/2 x 5mg  tablets on all days of week--EXCEPT on Mondays/Thursdays--upon which she takes 3x5mg  warfarin tablets for a total of 92.5mg  warfarin/wk. She reports no signs or symptoms of bleeding or embolic events.

## 2020-04-10 ENCOUNTER — Ambulatory Visit: Payer: Medicaid Other | Admitting: Pharmacist

## 2020-04-10 DIAGNOSIS — Z7901 Long term (current) use of anticoagulants: Secondary | ICD-10-CM

## 2020-04-10 DIAGNOSIS — D6859 Other primary thrombophilia: Secondary | ICD-10-CM

## 2020-04-10 LAB — POCT INR: INR: 2 (ref 2.0–3.0)

## 2020-04-10 NOTE — Patient Instructions (Signed)
Patient instructed to take medications as defined in the Anti-coagulation Track section of this encounter.  Patient instructed to take today's dose.  Patient instructed to take 2 and 1/2 of her 5mg  peach-colored warfarin tablets by mouth, once-daily at 6PM--EXCEPT on MONDAYS, [facility] and FRIDAYS--take three (3) of your 5mg  peach-colored warfarin tablets on Mondays , Wednesdays and Fridays.  Patient verbalized understanding of these instructions.
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
## 2020-04-11 NOTE — Progress Notes (Signed)
INTERNAL MEDICINE TEACHING ATTENDING ADDENDUM  I agree with pharmacy recommendations outlined above. Dorian Pod, MD

## 2020-05-08 ENCOUNTER — Other Ambulatory Visit: Payer: Self-pay

## 2020-05-08 ENCOUNTER — Ambulatory Visit: Payer: Medicaid Other | Admitting: Pharmacist

## 2020-05-08 DIAGNOSIS — D6859 Other primary thrombophilia: Secondary | ICD-10-CM

## 2020-05-08 DIAGNOSIS — Z7901 Long term (current) use of anticoagulants: Secondary | ICD-10-CM | POA: Diagnosis not present

## 2020-05-08 LAB — POCT INR: INR: 2.3 (ref 2.0–3.0)

## 2020-05-08 NOTE — Patient Instructions (Signed)
Patient instructed to take medications as defined in the Anti-coagulation Track section of this encounter.  Patient instructed to take today's dose.  Patient instructed to take  2 and 1/2 of her 5mg  peach-colored warfarin tablets by mouth, once-daily at 6PM--EXCEPT on MONDAYS, [facility] and FRIDAYS--take three (3) of your 5mg  peach-colored warfarin tablets on Mondays , Wednesdays and Fridays. Patient verbalized understanding of these instructions.
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
## 2020-05-20 ENCOUNTER — Other Ambulatory Visit: Payer: Self-pay

## 2020-05-20 ENCOUNTER — Emergency Department (HOSPITAL_COMMUNITY)
Admission: EM | Admit: 2020-05-20 | Discharge: 2020-05-20 | Discharge status: Absent without leave | Payer: Medicaid Other

## 2020-05-20 DIAGNOSIS — M549 Dorsalgia, unspecified: Secondary | ICD-10-CM | POA: Diagnosis present

## 2020-05-20 DIAGNOSIS — J45909 Unspecified asthma, uncomplicated: Secondary | ICD-10-CM | POA: Insufficient documentation

## 2020-05-20 DIAGNOSIS — L539 Erythematous condition, unspecified: Secondary | ICD-10-CM | POA: Insufficient documentation

## 2020-05-20 DIAGNOSIS — I10 Essential (primary) hypertension: Secondary | ICD-10-CM | POA: Insufficient documentation

## 2020-05-20 DIAGNOSIS — R0781 Pleurodynia: Secondary | ICD-10-CM | POA: Diagnosis not present

## 2020-05-20 DIAGNOSIS — F17210 Nicotine dependence, cigarettes, uncomplicated: Secondary | ICD-10-CM | POA: Diagnosis not present

## 2020-05-20 DIAGNOSIS — M791 Myalgia, unspecified site: Secondary | ICD-10-CM | POA: Diagnosis not present

## 2020-05-20 DIAGNOSIS — Z7901 Long term (current) use of anticoagulants: Secondary | ICD-10-CM | POA: Insufficient documentation

## 2020-05-20 DIAGNOSIS — Z79899 Other long term (current) drug therapy: Secondary | ICD-10-CM | POA: Diagnosis not present

## 2020-05-20 DIAGNOSIS — R0602 Shortness of breath: Secondary | ICD-10-CM | POA: Insufficient documentation

## 2020-05-20 NOTE — ED Notes (Signed)
Called pt name x3 to be triaged after pt was found to have left her triage room. No response from pt.

## 2020-05-21 ENCOUNTER — Emergency Department (HOSPITAL_BASED_OUTPATIENT_CLINIC_OR_DEPARTMENT_OTHER): Payer: Medicaid Other

## 2020-05-21 ENCOUNTER — Emergency Department (HOSPITAL_COMMUNITY): Payer: Medicaid Other

## 2020-05-21 ENCOUNTER — Emergency Department (HOSPITAL_COMMUNITY)
Admission: EM | Admit: 2020-05-21 | Discharge: 2020-05-21 | Disposition: A | Discharge status: Home / Self Care | Payer: Medicaid Other | Attending: Emergency Medicine | Admitting: Emergency Medicine

## 2020-05-21 ENCOUNTER — Other Ambulatory Visit: Payer: Self-pay

## 2020-05-21 DIAGNOSIS — R0781 Pleurodynia: Secondary | ICD-10-CM

## 2020-05-21 LAB — COMPREHENSIVE METABOLIC PANEL
ALT: 21 U/L (ref 0–44)
AST: 14 U/L — ABNORMAL LOW (ref 15–41)
Albumin: 3.3 g/dL — ABNORMAL LOW (ref 3.5–5.0)
Alkaline Phosphatase: 41 U/L (ref 38–126)
Anion gap: 8 (ref 5–15)
BUN: 22 mg/dL — ABNORMAL HIGH (ref 6–20)
CO2: 23 mmol/L (ref 22–32)
Calcium: 9.1 mg/dL (ref 8.9–10.3)
Chloride: 110 mmol/L (ref 98–111)
Creatinine, Ser: 1.31 mg/dL — ABNORMAL HIGH (ref 0.44–1.00)
GFR calc Af Amer: 56 mL/min — ABNORMAL LOW (ref >60)
GFR calc non Af Amer: 49 mL/min — ABNORMAL LOW (ref >60)
Glucose, Bld: 95 mg/dL (ref 70–99)
Potassium: 3.8 mmol/L (ref 3.5–5.1)
Sodium: 141 mmol/L (ref 135–145)
Total Bilirubin: 0.2 mg/dL — ABNORMAL LOW (ref 0.3–1.2)
Total Protein: 6.1 g/dL — ABNORMAL LOW (ref 6.5–8.1)

## 2020-05-21 LAB — CBC
HCT: 41.7 % (ref 36.0–46.0)
Hemoglobin: 13.1 g/dL (ref 12.0–15.0)
MCH: 25.2 pg — ABNORMAL LOW (ref 26.0–34.0)
MCHC: 31.4 g/dL (ref 30.0–36.0)
MCV: 80.3 fL (ref 80.0–100.0)
Platelets: 188 10*3/uL (ref 150–400)
RBC: 5.19 MIL/uL — ABNORMAL HIGH (ref 3.87–5.11)
RDW: 14.7 % (ref 11.5–15.5)
WBC: 3.3 10*3/uL — ABNORMAL LOW (ref 4.0–10.5)
nRBC: 0 % (ref 0.0–0.2)

## 2020-05-21 LAB — TROPONIN I (HIGH SENSITIVITY): Troponin I (High Sensitivity): 2 ng/L (ref <18)

## 2020-05-21 LAB — I-STAT BETA HCG BLOOD, ED (MC, WL, AP ONLY): I-stat hCG, quantitative: 5.6 m[IU]/mL — ABNORMAL HIGH (ref <5)

## 2020-05-21 LAB — PROTIME-INR
INR: 2.2 — ABNORMAL HIGH (ref 0.8–1.2)
Prothrombin Time: 23.7 seconds — ABNORMAL HIGH (ref 11.4–15.2)

## 2020-05-21 MED ORDER — IOHEXOL 350 MG/ML SOLN
60.0000 mL | Freq: Once | INTRAVENOUS | Status: AC | PRN
  Administered 2020-05-21: 60 mL via INTRAVENOUS

## 2020-05-21 MED ORDER — HYDROCODONE-ACETAMINOPHEN 5-325 MG PO TABS
1.0000 | ORAL_TABLET | Freq: Four times a day (QID) | ORAL | 0 refills | Status: DC | PRN
Start: 2020-05-21 — End: 2020-07-25

## 2020-05-21 MED ORDER — LIDOCAINE 4 % EX CREA: 1.0000 "application " | TOPICAL_CREAM | Freq: Three times a day (TID) | CUTANEOUS | 0 refills | Status: DC | PRN

## 2020-05-21 MED ORDER — MORPHINE SULFATE (PF) 4 MG/ML IV SOLN
4.0000 mg | Freq: Once | INTRAVENOUS | Status: AC
  Administered 2020-05-21: 4 mg via INTRAVENOUS
  Filled 2020-05-21: qty 1

## 2020-05-21 NOTE — ED Notes (Signed)
Patient transported to CT 

## 2020-05-21 NOTE — Discharge Instructions (Signed)
Your work-up today was reassuring with no evidence of blood clot or heart attack.  You can take extra strength Tylenol every 6 hours as needed for pain.  Do not exceed more than 4000 mg of Tylenol daily. You can take hydrocodone as needed for severe pain but do not drive, drink alcohol, or operate heavy machinery while taking this medicine as it can cause drowsiness.  Be aware this medicine also contains Tylenol and do not exceed more than 4000 mg of Tylenol daily.  You can also apply ice or heat, whichever feels best, 20 minutes at a time a few times daily.  You can apply lidocaine cream or patches such as Salonpas or icy hot that can be helpful as well.  Your Covid test will result within 24 hours.  You will receive a phone call if your test results are positive.  Please call your primary care provider tomorrow to schedule follow-up appointment for sometime this week.  Return to the emergency department if any concerning signs or symptoms develop such as fevers, shortness of breath, loss of consciousness.

## 2020-05-21 NOTE — ED Notes (Signed)
D/c instructions & prescriptions reviewed with pt.  Pt verbalized understanding. Pt d/c.

## 2020-05-21 NOTE — ED Notes (Signed)
Pt refused temp when getting vitals
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
## 2020-05-21 NOTE — Progress Notes (Signed)
VASCULAR LAB    Bilateral lower extremity venous dupex completed.    Preliminary report:  See CV proc for preliminary results.   Messaged [place], [place], with results.   Humbert Morozov, RVT 05/21/2020, 1:10 PM

## 2020-05-21 NOTE — ED Triage Notes (Signed)
To ED for eval of upper right back pain - started 5 days ago and now radiates around to axilla area. Pt states pain is worse with a deep breath. Hx of blood clots and on Coumadin. No sob. Appears in nad.

## 2020-05-22 ENCOUNTER — Other Ambulatory Visit: Payer: Self-pay | Admitting: Internal Medicine

## 2020-05-22 DIAGNOSIS — Z1231 Encounter for screening mammogram for malignant neoplasm of breast: Secondary | ICD-10-CM

## 2020-06-12 ENCOUNTER — Ambulatory Visit: Payer: Medicaid Other

## 2020-06-22 ENCOUNTER — Other Ambulatory Visit: Payer: Self-pay | Admitting: Pharmacist

## 2020-06-22 DIAGNOSIS — D6859 Other primary thrombophilia: Secondary | ICD-10-CM

## 2020-06-22 DIAGNOSIS — Z7901 Long term (current) use of anticoagulants: Secondary | ICD-10-CM

## 2020-06-22 MED ORDER — WARFARIN SODIUM 5 MG PO TABS: ORAL_TABLET | ORAL | 2 refills | Status: DC

## 2020-06-22 NOTE — Telephone Encounter (Signed)
Requesting refill on her warfarin Rx. Will send electronically. Taking Su-12.5mg M-15mg T-12.5mg W-15mg Th-12.5mg F-15mg Sa-12.5mg . Disp: #76 w/2 RF's.

## 2020-07-03 ENCOUNTER — Other Ambulatory Visit: Payer: Self-pay

## 2020-07-03 ENCOUNTER — Ambulatory Visit (INDEPENDENT_AMBULATORY_CARE_PROVIDER_SITE_OTHER): Payer: Medicaid Other | Admitting: Pharmacist

## 2020-07-03 DIAGNOSIS — Z23 Encounter for immunization: Secondary | ICD-10-CM

## 2020-07-03 DIAGNOSIS — Z7901 Long term (current) use of anticoagulants: Secondary | ICD-10-CM

## 2020-07-03 DIAGNOSIS — D6859 Other primary thrombophilia: Secondary | ICD-10-CM

## 2020-07-03 LAB — POCT INR: INR: 2.1 (ref 2.0–3.0)

## 2020-07-03 NOTE — Patient Instructions (Signed)
Patient instructed to take medications as defined in the Anti-coagulation Track section of this encounter.  °Patient instructed to take today's dose.  °Patient instructed to take 2 and 1/2 of her 5mg peach-colored warfarin tablets by mouth, once-daily at 6PM--EXCEPT on SUNDAYS, MONDAYS, WEDNESDAYS and FRIDAYS--take three (3) of your 5mg peach-colored warfarin tablets on Sundays, Mondays , Wednesdays and Fridays.  °Patient verbalized understanding of these instructions.  ° °
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
## 2020-07-06 ENCOUNTER — Ambulatory Visit: Payer: Medicaid Other

## 2020-07-06 NOTE — Progress Notes (Signed)
I reviewed Dr. Gladstone Pih note and INR was at the low end of goal.  Dose increased.

## 2020-07-18 ENCOUNTER — Encounter: Payer: Self-pay | Admitting: Neurology

## 2020-07-18 ENCOUNTER — Ambulatory Visit: Payer: Medicaid Other | Admitting: Neurology
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
## 2020-07-24 ENCOUNTER — Encounter (HOSPITAL_COMMUNITY): Payer: Self-pay | Admitting: Emergency Medicine

## 2020-07-24 ENCOUNTER — Other Ambulatory Visit: Payer: Self-pay

## 2020-07-24 ENCOUNTER — Emergency Department (HOSPITAL_COMMUNITY)
Admission: EM | Admit: 2020-07-24 | Discharge: 2020-07-25 | Disposition: A | Discharge status: Home / Self Care | Payer: Medicaid Other | Attending: Emergency Medicine | Admitting: Emergency Medicine

## 2020-07-24 DIAGNOSIS — W208XXA Other cause of strike by thrown, projected or falling object, initial encounter: Secondary | ICD-10-CM | POA: Diagnosis not present

## 2020-07-24 DIAGNOSIS — Z79899 Other long term (current) drug therapy: Secondary | ICD-10-CM | POA: Insufficient documentation

## 2020-07-24 DIAGNOSIS — S60222A Contusion of left hand, initial encounter: Secondary | ICD-10-CM | POA: Insufficient documentation

## 2020-07-24 DIAGNOSIS — S6992XA Unspecified injury of left wrist, hand and finger(s), initial encounter: Secondary | ICD-10-CM | POA: Diagnosis present

## 2020-07-24 DIAGNOSIS — F17210 Nicotine dependence, cigarettes, uncomplicated: Secondary | ICD-10-CM | POA: Diagnosis not present

## 2020-07-24 DIAGNOSIS — Z7901 Long term (current) use of anticoagulants: Secondary | ICD-10-CM | POA: Insufficient documentation

## 2020-07-24 DIAGNOSIS — J45909 Unspecified asthma, uncomplicated: Secondary | ICD-10-CM | POA: Diagnosis not present

## 2020-07-24 DIAGNOSIS — I10 Essential (primary) hypertension: Secondary | ICD-10-CM | POA: Insufficient documentation

## 2020-07-24 MED ORDER — OXYCODONE-ACETAMINOPHEN 5-325 MG PO TABS
1.0000 | ORAL_TABLET | Freq: Once | ORAL | Status: AC
  Administered 2020-07-24: 1 via ORAL
  Filled 2020-07-24: qty 1
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
## 2020-07-25 ENCOUNTER — Emergency Department (HOSPITAL_COMMUNITY): Payer: Medicaid Other

## 2020-07-25 MED ORDER — HYDROCODONE-ACETAMINOPHEN 5-325 MG PO TABS
1.0000 | ORAL_TABLET | Freq: Four times a day (QID) | ORAL | 0 refills | Status: DC | PRN
Start: 2020-07-25 — End: 2020-11-20

## 2020-07-25 NOTE — Progress Notes (Signed)
Orthopedic Tech Progress Note Patient Details:  Michele Gillespie 30-Jan-1976 [id number]  Ortho Devices Type of Ortho Device: Velcro wrist splint Ortho Device/Splint Location: lue Ortho Device/Splint Interventions: Ordered, Application, Adjustment   Post Interventions Patient Tolerated: Well Instructions Provided: Care of device, Adjustment of device   [name] 07/25/2020, 6:48 AM
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
## 2020-07-25 NOTE — ED Notes (Signed)
Called Ortho re: left wrist brace.

## 2020-07-25 NOTE — Discharge Instructions (Addendum)
Apply ice for thirty minutes at a time, four times a day.  Take acetaminophen as needed for pain.  Wear the brace as needed.

## 2020-07-31 ENCOUNTER — Ambulatory Visit: Payer: Medicaid Other

## 2020-08-01 ENCOUNTER — Ambulatory Visit: Payer: Medicaid Other

## 2020-08-10 ENCOUNTER — Other Ambulatory Visit: Payer: Self-pay | Admitting: *Deleted

## 2020-08-10 DIAGNOSIS — I10 Essential (primary) hypertension: Secondary | ICD-10-CM

## 2020-08-10 MED ORDER — AMLODIPINE BESYLATE 5 MG PO TABS: 5.0000 mg | ORAL_TABLET | Freq: Every day | ORAL | 11 refills | Status: DC

## 2020-08-11 ENCOUNTER — Other Ambulatory Visit: Payer: Self-pay | Admitting: *Deleted

## 2020-08-11 MED ORDER — OMEPRAZOLE 20 MG PO CPDR
20.0000 mg | DELAYED_RELEASE_CAPSULE | Freq: Every day | ORAL | 2 refills | Status: DC
Start: 2020-08-11 — End: 2020-10-10

## 2020-08-18 ENCOUNTER — Other Ambulatory Visit: Payer: Self-pay

## 2020-08-18 ENCOUNTER — Ambulatory Visit
Admission: RE | Admit: 2020-08-18 | Discharge: 2020-08-18 | Disposition: A | Discharge status: Home / Self Care | Payer: Medicaid Other | Source: Ambulatory Visit | Attending: Internal Medicine | Admitting: Internal Medicine

## 2020-08-18 DIAGNOSIS — Z1231 Encounter for screening mammogram for malignant neoplasm of breast: Secondary | ICD-10-CM

## 2020-09-12 ENCOUNTER — Ambulatory Visit: Payer: Medicaid Other | Admitting: Pharmacist

## 2020-09-12 DIAGNOSIS — Z7901 Long term (current) use of anticoagulants: Secondary | ICD-10-CM | POA: Diagnosis not present

## 2020-09-12 DIAGNOSIS — D6859 Other primary thrombophilia: Secondary | ICD-10-CM | POA: Diagnosis present

## 2020-09-12 LAB — POCT INR: INR: 2.5 (ref 2.0–3.0)

## 2020-09-12 NOTE — Progress Notes (Signed)
INTERNAL MEDICINE TEACHING ATTENDING ADDENDUM  I agree with pharmacy recommendations as outlined in their note.   Rhena Glace N Kenadie Royce, MD  
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
## 2020-09-12 NOTE — Patient Instructions (Signed)
Patient instructed to take medications as defined in the Anti-coagulation Track section of this encounter.  Patient instructed to take today's dose.  Patient instructed to take 2 and 1/2 of her 5mg  peach-colored warfarin tablets by mouth, once-daily at 6PM--EXCEPT on SUNDAYS, MONDAYS, WEDNESDAYS and FRIDAYS--take three (3) of your 5mg  peach-colored warfarin tablets on Sundays, Mondays , Wednesdays and Fridays.  Patient verbalized understanding of these instructions.

## 2020-09-24 ENCOUNTER — Other Ambulatory Visit: Payer: Self-pay | Admitting: Pharmacist

## 2020-09-24 DIAGNOSIS — D6859 Other primary thrombophilia: Secondary | ICD-10-CM

## 2020-09-24 DIAGNOSIS — Z7901 Long term (current) use of anticoagulants: Secondary | ICD-10-CM

## 2020-09-24 MED ORDER — WARFARIN SODIUM 5 MG PO TABS: ORAL_TABLET | ORAL | 2 refills | Status: DC

## 2020-10-02 ENCOUNTER — Ambulatory Visit: Payer: Medicaid Other

## 2020-10-09 ENCOUNTER — Encounter: Payer: Self-pay | Admitting: Student

## 2020-10-09 ENCOUNTER — Ambulatory Visit: Payer: Medicaid Other | Admitting: Student

## 2020-10-09 ENCOUNTER — Other Ambulatory Visit: Payer: Self-pay

## 2020-10-09 VITALS — BP 132/92 | HR 76 | Temp 98.1°F | Ht 64.5 in | Wt 245.3 lb

## 2020-10-09 DIAGNOSIS — K5900 Constipation, unspecified: Secondary | ICD-10-CM | POA: Diagnosis not present

## 2020-10-09 DIAGNOSIS — E7849 Other hyperlipidemia: Secondary | ICD-10-CM

## 2020-10-09 DIAGNOSIS — M359 Systemic involvement of connective tissue, unspecified: Secondary | ICD-10-CM

## 2020-10-09 DIAGNOSIS — I10 Essential (primary) hypertension: Secondary | ICD-10-CM | POA: Diagnosis not present

## 2020-10-09 DIAGNOSIS — D509 Iron deficiency anemia, unspecified: Secondary | ICD-10-CM | POA: Diagnosis not present

## 2020-10-09 DIAGNOSIS — R1310 Dysphagia, unspecified: Secondary | ICD-10-CM | POA: Diagnosis not present

## 2020-10-09 DIAGNOSIS — K219 Gastro-esophageal reflux disease without esophagitis: Secondary | ICD-10-CM | POA: Diagnosis not present

## 2020-10-09 DIAGNOSIS — D6859 Other primary thrombophilia: Secondary | ICD-10-CM

## 2020-10-09 DIAGNOSIS — Z6841 Body Mass Index (BMI) 40.0 and over, adult: Secondary | ICD-10-CM | POA: Diagnosis not present

## 2020-10-09 DIAGNOSIS — Z0000 Encounter for general adult medical examination without abnormal findings: Secondary | ICD-10-CM

## 2020-10-09 DIAGNOSIS — Z7901 Long term (current) use of anticoagulants: Secondary | ICD-10-CM

## 2020-10-09 DIAGNOSIS — F329 Major depressive disorder, single episode, unspecified: Secondary | ICD-10-CM | POA: Diagnosis not present

## 2020-10-09 DIAGNOSIS — R1013 Epigastric pain: Secondary | ICD-10-CM | POA: Diagnosis not present

## 2020-10-09 NOTE — Patient Instructions (Addendum)
[name], Her[place] were seen today for trouble swallowing and stomach pains. We will increase your omeprazole from 20mg  to 40mg  daily to see if this helps. I will also be ordering an upper GI (barium swallow) to evaluate your swallowing and refer you to a GI doctor to assess your stomach pains.   You should receive a call from radiology to schedule your upper GI study as soon as possible.   You would also benefit from rheumatology follow up.   I will ask the front desk to give you a call to schedule follow up for repeat labs as soon as you are able to return to clinic (within the next 2 weeks if possible).   In the meantime, please call if you have any questions or concerns at [phone].  Thank you,  Dr. Sharmon Leyden Neurology in [place] (8th ed., pp. [phone]). [place], [place]: Elsevier."> Marya Amsler of Surgery (21st ed., pp. [phone]). [place], [place]: Elsevier, Inc.">  Dysphagia  Dysphagia is trouble swallowing. This condition occurs when solids and liquids stick in a person's throat on the way down to the stomach, or when food takes longer to get to the stomach than usual. You may have problems swallowing food, liquids, or both. You may also have pain while trying to swallow. It may take you more time and effort to swallow something. What are the causes? This condition may be caused by:  Muscle problems. These may make it difficult for you to move food and liquids through the esophagus, which is the tube that connects your mouth to your stomach.  Blockages. You may have ulcers, scar tissue, or inflammation that blocks the normal passage of food and liquids. Causes of these problems include: ? Acid reflux from your stomach into your esophagus (gastroesophageal reflux). ? Infections. ? Radiation treatment for cancer. ? Medicines taken without enough fluids to wash them down into your stomach.  Stroke. This can affect the nerves and make it difficult to  swallow.  Nerve problems. These prevent signals from being sent to the muscles of your esophagus to squeeze (contract) and move what you swallow down to your stomach.  Globus pharyngeus. This is a common problem that involves a feeling like something is stuck in your throat or a sense of trouble with swallowing, even though nothing is wrong with the swallowing passages.  Certain conditions, such as cerebral palsy or Parkinson's disease. What are the signs or symptoms? Common symptoms of this condition include:  A feeling that solids or liquids are stuck in your throat on the way down to the stomach.  Pain while swallowing.  Coughing or gagging while trying to swallow. Other symptoms include:  Food moving back from your stomach to your mouth (regurgitation).  Noises coming from your throat.  Chest discomfort when swallowing.  A feeling of fullness when swallowing.  Drooling, especially when the throat is blocked.  Heartburn. How is this diagnosed? This condition may be diagnosed by:  Barium swallow X-ray. In this test, you will swallow a white liquid that sticks to the inside of your esophagus. X-ray images are then taken.  Endoscopy. In this test, a flexible telescope is inserted down your throat to look at your esophagus and your stomach.  CT scans or an MRI. How is this treated? Treatment for dysphagia depends on the cause of this condition:  If the dysphagia is caused by acid reflux or infection, medicines may be used. These may include antibiotics or heartburn  medicines.  If the dysphagia is caused by problems with the muscles, swallowing therapy may be used to help you strengthen your swallowing muscles. You may have to do specific exercises to strengthen the muscles or stretch them.  If the dysphagia is caused by a blockage or mass, procedures to remove the blockage may be done. You may need surgery and a feeding tube. You may need to make diet changes. Ask your  health care provider for specific instructions. Follow these instructions at home: Medicines  Take over-the-counter and prescription medicines only as told by your health care provider.  If you were prescribed an antibiotic medicine, take it as told by your health care provider. Do not stop taking the antibiotic even if you start to feel better. Eating and drinking  Make any diet changes as told by your health care provider.  Work with a diet and nutrition specialist (dietitian) to create an eating plan that will help you get the nutrients you need in order to stay healthy.  Eat soft foods that are easier to swallow.  Cut your food into small pieces and eat slowly. Take small bites.  Eat and drink only when you are sitting upright.  Do not drink alcohol or caffeine. If you need help quitting, ask your health care provider.   General instructions  Check your weight every day to make sure you are not losing weight.  Do not use any products that contain nicotine or tobacco. These products include cigarettes, chewing tobacco, and vaping devices, such as e-cigarettes. If you need help quitting, ask your health care provider.  Keep all follow-up visits. This is important. Contact a health care provider if:  You lose weight because you cannot swallow.  You cough when you drink liquids.  You cough up partially digested food. Get help right away if:  You cannot swallow your saliva.  You have shortness of breath, a fever, or both.  Your voice is hoarse and you have trouble swallowing. These symptoms may represent a serious problem that is an emergency. Do not wait to see if the symptoms will go away. Get medical help right away. Call your local emergency services (911 in the [place] Do not drive yourself to the hospital. Summary  Dysphagia is trouble swallowing. This condition occurs when solids and liquids stick in a person's throat on the way down to the stomach. You may cough or  gag while trying to swallow.  Dysphagia has many possible causes.  Treatment for dysphagia depends on the cause of the condition.  Keep all follow-up visits. This is important. This information is not intended to replace advice given to you by your health care provider. Make sure you discuss any questions you have with your health care provider. Document Revised: 05/06/2020 Document Reviewed: 05/06/2020 Elsevier Patient Education  2021 [employer].

## 2020-10-10 DIAGNOSIS — Z6841 Body Mass Index (BMI) 40.0 and over, adult: Secondary | ICD-10-CM | POA: Insufficient documentation

## 2020-10-10 DIAGNOSIS — M351 Other overlap syndromes: Secondary | ICD-10-CM | POA: Insufficient documentation

## 2020-10-10 DIAGNOSIS — M359 Systemic involvement of connective tissue, unspecified: Secondary | ICD-10-CM | POA: Insufficient documentation

## 2020-10-10 DIAGNOSIS — R1310 Dysphagia, unspecified: Secondary | ICD-10-CM | POA: Insufficient documentation

## 2020-10-10 MED ORDER — OMEPRAZOLE 20 MG PO CPDR: 40.0000 mg | DELAYED_RELEASE_CAPSULE | Freq: Every day | ORAL | 2 refills | Status: DC

## 2020-10-10 NOTE — Assessment & Plan Note (Signed)
A: Patient has had borderline diabetes in the past. No recent lipid profile.   P:  - Check lipid profile and Hb A1c at future visit.

## 2020-10-10 NOTE — Progress Notes (Signed)
Internal Medicine Clinic Attending  Case discussed with Dr. Speakman at the time of the visit.  We reviewed the resident's history and exam and pertinent patient test results.  I agree with the assessment, diagnosis, and plan of care documented in the resident's note.  Osmani Kersten, M.D., Ph.D.  

## 2020-10-10 NOTE — Assessment & Plan Note (Addendum)
A: Per historical notes from [facility], patient has UCTD with positive ANA/RNP antibodies, possible renal involvement with microscopic hematuria. Patient states she has lupus. Was previously on Plaquenil and steroids were avoided due to morbid obesity/borderline DM, although it is unclear whether patient continues to take this and was lost to follow up, requesting rheum follow up in the area.   P:  - Will refer to rheumatologist in [place] - Follow up medications at future visit  - Check CBC w/ Diff, iron studies, CMP at future visit

## 2020-10-10 NOTE — Assessment & Plan Note (Signed)
A: Blood pressure slightly elevated today. BP Readings from Last 3 Encounters:  10/09/20 (!) 132/92  07/25/20 (!) 139/95  05/21/20 117/84   P:  - Continue amlodipine 5mg  for now - May benefit from ACE/ARB pending future laboratory testing

## 2020-10-10 NOTE — Assessment & Plan Note (Signed)
A: Per chart review, patient has history of depression and schizophrenia, being treated with Abilify. She does have flat affect and inattentiveness today.   P:  - Continue abilify 10mg  daily  - Continue to monitor

## 2020-10-10 NOTE — Assessment & Plan Note (Addendum)
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 Additional anticoagulation workup including for antiphospholipid syndrome was negative. She was lost to follow up and was eager to leave today without full clarification of her medications/medical history.   P: - Will require further review at future visit - Referral sent to rheumatology
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
## 2020-10-10 NOTE — Assessment & Plan Note (Signed)
A: States last pap smear was 3-4 years ago and would like this done, although not today.   P:  - Perform pelvic examination w/ PAP next visit

## 2020-10-10 NOTE — Assessment & Plan Note (Signed)
A: Michele Gillespie endorses intermittent difficulty swallowing pills and some foods over the past couple of months, worsened recently. Denies odynophagia or troubles swallowing fluids. She feels that solids get stuck in her lower esophagus. She also endorses constant 6/10 epigastric pain over the past couple of weeks, worsened with eating, associated with decreased appetite and activity, stating she often finds herself laying in bed due to this pain. May be due to CTD versus other esophageal dysmotility/mass. No documented EGD's.  P:  - Increase omeprazole from 20 to 40mg  daily - Refer to GI and rheumatology

## 2020-10-10 NOTE — Assessment & Plan Note (Signed)
A: Endorses constipation, having stools every 1-2 days and describes stools as hard pebbles. Uses laxatives every Friday with some relief. Denies dark or bloody stools. Has hx diverticulosis.   P:  - Would benefit from daily miralax vs. Senokot  - Referral to GI

## 2020-10-10 NOTE — Assessment & Plan Note (Signed)
A: Patient states she continues to have heartburn despite omeprazole 20mg  daily and does not use TUMS or other OTC medications. May be contributing to recent epigastric pain.   P:  - Increase omeprazole to 40mg  daily  - Referral sent to GI

## 2020-10-13 ENCOUNTER — Other Ambulatory Visit: Payer: Medicaid Other

## 2020-10-23 ENCOUNTER — Encounter: Payer: Self-pay | Admitting: *Deleted

## 2020-10-23 ENCOUNTER — Other Ambulatory Visit (HOSPITAL_COMMUNITY): Payer: Self-pay

## 2020-10-23 ENCOUNTER — Other Ambulatory Visit: Payer: Self-pay | Admitting: Internal Medicine

## 2020-10-23 DIAGNOSIS — R1310 Dysphagia, unspecified: Secondary | ICD-10-CM

## 2020-10-30 ENCOUNTER — Ambulatory Visit (HOSPITAL_COMMUNITY): Payer: Medicaid Other

## 2020-10-30 ENCOUNTER — Inpatient Hospital Stay (HOSPITAL_COMMUNITY): Admission: RE | Admit: 2020-10-30 | Payer: Medicaid Other | Source: Ambulatory Visit

## 2020-10-30 NOTE — Progress Notes (Signed)
Re-ordering as esophagram as she seems to have primarily esophageal dysphagia.

## 2020-10-30 NOTE — Addendum Note (Signed)
Addended by: Oda Kilts on: 10/30/2020 08:35 AM   Modules accepted: Orders
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
## 2020-11-16 ENCOUNTER — Ambulatory Visit: Payer: Medicaid Other | Admitting: Internal Medicine

## 2020-11-19 ENCOUNTER — Emergency Department (HOSPITAL_COMMUNITY)
Admission: EM | Admit: 2020-11-19 | Discharge: 2020-11-20 | Disposition: A | Discharge status: Home / Self Care | Payer: Medicaid Other | Attending: Emergency Medicine | Admitting: Emergency Medicine

## 2020-11-19 ENCOUNTER — Other Ambulatory Visit: Payer: Self-pay

## 2020-11-19 ENCOUNTER — Encounter (HOSPITAL_COMMUNITY): Payer: Self-pay | Admitting: Emergency Medicine

## 2020-11-19 ENCOUNTER — Emergency Department (HOSPITAL_COMMUNITY): Payer: Medicaid Other

## 2020-11-19 DIAGNOSIS — J45909 Unspecified asthma, uncomplicated: Secondary | ICD-10-CM | POA: Insufficient documentation

## 2020-11-19 DIAGNOSIS — Z7901 Long term (current) use of anticoagulants: Secondary | ICD-10-CM | POA: Insufficient documentation

## 2020-11-19 DIAGNOSIS — R1012 Left upper quadrant pain: Secondary | ICD-10-CM

## 2020-11-19 DIAGNOSIS — K5900 Constipation, unspecified: Secondary | ICD-10-CM | POA: Insufficient documentation

## 2020-11-19 DIAGNOSIS — I10 Essential (primary) hypertension: Secondary | ICD-10-CM | POA: Insufficient documentation

## 2020-11-19 DIAGNOSIS — F17210 Nicotine dependence, cigarettes, uncomplicated: Secondary | ICD-10-CM | POA: Insufficient documentation

## 2020-11-19 DIAGNOSIS — R0789 Other chest pain: Secondary | ICD-10-CM | POA: Diagnosis not present

## 2020-11-19 DIAGNOSIS — Z79899 Other long term (current) drug therapy: Secondary | ICD-10-CM | POA: Insufficient documentation

## 2020-11-19 LAB — CBC
HCT: 44.4 % (ref 36.0–46.0)
Hemoglobin: 14.7 g/dL (ref 12.0–15.0)
MCH: 25.4 pg — ABNORMAL LOW (ref 26.0–34.0)
MCHC: 33.1 g/dL (ref 30.0–36.0)
MCV: 76.8 fL — ABNORMAL LOW (ref 80.0–100.0)
Platelets: 206 10*3/uL (ref 150–400)
RBC: 5.78 MIL/uL — ABNORMAL HIGH (ref 3.87–5.11)
RDW: 14.6 % (ref 11.5–15.5)
WBC: 4.5 10*3/uL (ref 4.0–10.5)
nRBC: 0 % (ref 0.0–0.2)

## 2020-11-19 LAB — BASIC METABOLIC PANEL
Anion gap: 9 (ref 5–15)
BUN: 14 mg/dL (ref 6–20)
CO2: 21 mmol/L — ABNORMAL LOW (ref 22–32)
Calcium: 9.3 mg/dL (ref 8.9–10.3)
Chloride: 108 mmol/L (ref 98–111)
Creatinine, Ser: 1.16 mg/dL — ABNORMAL HIGH (ref 0.44–1.00)
GFR, Estimated: 59 mL/min — ABNORMAL LOW (ref >60)
Glucose, Bld: 98 mg/dL (ref 70–99)
Potassium: 3.6 mmol/L (ref 3.5–5.1)
Sodium: 138 mmol/L (ref 135–145)

## 2020-11-19 LAB — TROPONIN I (HIGH SENSITIVITY): Troponin I (High Sensitivity): 4 ng/L (ref <18)

## 2020-11-19 LAB — PROTIME-INR
INR: 2.4 — ABNORMAL HIGH (ref 0.8–1.2)
Prothrombin Time: 25.6 seconds — ABNORMAL HIGH (ref 11.4–15.2)

## 2020-11-19 NOTE — ED Triage Notes (Signed)
Pt reports 45 minutes ago she woken out of sleep w/ left sided chest pain.  She is diaphoretic and SOB.  Tested negative for COVID yesterday.  Hx of blood clots, is on blood thinners at this time.

## 2020-11-20 ENCOUNTER — Emergency Department (HOSPITAL_COMMUNITY): Payer: Medicaid Other

## 2020-11-20 LAB — HEPATIC FUNCTION PANEL
ALT: 22 U/L (ref 0–44)
AST: 15 U/L (ref 15–41)
Albumin: 3.5 g/dL (ref 3.5–5.0)
Alkaline Phosphatase: 38 U/L (ref 38–126)
Bilirubin, Direct: <0.1 mg/dL (ref 0.0–0.2)
Total Bilirubin: 0.4 mg/dL (ref 0.3–1.2)
Total Protein: 6.5 g/dL (ref 6.5–8.1)

## 2020-11-20 LAB — PREGNANCY, URINE: Preg Test, Ur: NEGATIVE

## 2020-11-20 LAB — TROPONIN I (HIGH SENSITIVITY): Troponin I (High Sensitivity): 4 ng/L (ref <18)

## 2020-11-20 LAB — LIPASE, BLOOD: Lipase: 27 U/L (ref 11–51)

## 2020-11-20 MED ORDER — MORPHINE SULFATE (PF) 4 MG/ML IV SOLN
4.0000 mg | Freq: Once | INTRAVENOUS | Status: AC
  Administered 2020-11-20: 4 mg via INTRAVENOUS
  Filled 2020-11-20: qty 1

## 2020-11-20 MED ORDER — ONDANSETRON HCL 4 MG/2ML IJ SOLN
4.0000 mg | Freq: Once | INTRAMUSCULAR | Status: AC
  Administered 2020-11-20: 4 mg via INTRAVENOUS
  Filled 2020-11-20: qty 2

## 2020-11-20 MED ORDER — IOHEXOL 300 MG/ML  SOLN
100.0000 mL | Freq: Once | INTRAMUSCULAR | Status: AC | PRN
  Administered 2020-11-20: 100 mL via INTRAVENOUS

## 2020-11-20 NOTE — Discharge Instructions (Signed)
Thank you for allowing me to care for you today in the Emergency Department.   Your work-up today was overall reassuring.  Your scan did show some constipation.  You can take 650 mg of Tylenol for pain.  Take MiraLAX 1-2 times daily.  Dulcolax is also available over-the-counter.  Take as directed on the label to help with constipation.  Make sure that you are drinking at least 64 ounces of water daily.  Increasing the amount of fiber in your diet will also help with constipation.  Please follow-up with your primary care provider for reevaluation.  Return to the emergency department if you stop passing gas, develop severe, uncontrollable abdominal pain, high fevers with severe abdominal pain, if you pass out, develop respiratory distress, uncontrollable vomiting, or other new, concerning symptoms.
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
## 2020-11-30 ENCOUNTER — Telehealth: Payer: Self-pay

## 2020-11-30 ENCOUNTER — Encounter: Payer: Self-pay | Admitting: Gastroenterology

## 2020-11-30 ENCOUNTER — Ambulatory Visit: Payer: Medicaid Other | Admitting: Gastroenterology

## 2020-11-30 VITALS — BP 148/80 | HR 74 | Ht 64.0 in | Wt 233.0 lb

## 2020-11-30 DIAGNOSIS — R1013 Epigastric pain: Secondary | ICD-10-CM | POA: Diagnosis not present

## 2020-11-30 DIAGNOSIS — K219 Gastro-esophageal reflux disease without esophagitis: Secondary | ICD-10-CM | POA: Diagnosis not present

## 2020-11-30 DIAGNOSIS — R1310 Dysphagia, unspecified: Secondary | ICD-10-CM

## 2020-11-30 DIAGNOSIS — K5900 Constipation, unspecified: Secondary | ICD-10-CM | POA: Diagnosis not present

## 2020-11-30 DIAGNOSIS — Z7901 Long term (current) use of anticoagulants: Secondary | ICD-10-CM

## 2020-11-30 MED ORDER — OMEPRAZOLE 40 MG PO CPDR
40.0000 mg | DELAYED_RELEASE_CAPSULE | Freq: Every morning | ORAL | 3 refills | Status: DC
Start: 2020-11-30 — End: 2021-01-17

## 2020-11-30 MED ORDER — LINACLOTIDE 72 MCG PO CAPS: 72.0000 ug | ORAL_CAPSULE | Freq: Every day | ORAL | 2 refills | Status: DC

## 2020-11-30 MED ORDER — POLYETHYLENE GLYCOL 3350 17 GM/SCOOP PO POWD: 17.0000 g | Freq: Every day | ORAL | 1 refills | Status: DC
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
## 2020-11-30 NOTE — Patient Instructions (Signed)
It was a pleasure to meet you today. Based on our discussion, I am providing you with my recommendations below:  RECOMMENDATION(S):   To better evaluate your symptoms, I am recommending diagnostic studies and making changes to some of your medications as detailed below.  IMAGING:  . You will be contacted by [facility] (Your caller ID will indicate phone # [phone]) within the next business 2 days to schedule your Esophagram. If you have not heard from them within 2 business days, please call [facility] at [phone] to follow up on the status of your appointment.    COLONOSCOPY AND ENDOSCOPY:   . You have been scheduled for an endoscopy and a colonoscopy. Please follow the written instructions given to you at your visit today.  PREP:   . Please pick up your prep supplies at the pharmacy within the next 1-3 days.  INHALERS:   . If you use inhalers (even only as needed), please bring them with you on the day of your procedure.  MEDICATIONS TO HOLD:  . We will contact your provider to request permission for you to hold Coumadin. Once we receive a response, you will be contacted by our office. If you do not hear from our office 1 week prior to your scheduled procedure, please contact our office at [phone].   COLONOSCOPY TIPS:  . To reduce nausea and dehydration, stay well hydrated for 3-4 days prior to the exam.  . To prevent skin/hemorrhoid irritation - prior to wiping, put A&Dointment or vaseline on the toilet paper. Marland Kitchen Keep a towel or pad on the bed.  Marland Kitchen BEFORE STARTING YOUR PREP, drink  64oz of clear liquids in the morning. This will help to flush the colon and will ensure you are well hydrated!!!!  NOTE - This is in addition to the fluids required for to complete your prep. . Use of a flavored hard candy, such as grape Anise Salvo, can counteract some of the flavor of the prep and may prevent some nausea.   PRESCRIPTION  MEDICATION(S):   We have sent the following medication(s) to your pharmacy:  . Omeprazole - please take 40mg  by mouth every morning . Linzess - please take 72mg  by mouth every day  NOTE: If your medication(s) requires a PRIOR AUTHORIZATION, we will receive notification from your pharmacy. Once received, the process to submit for approval may take up to 7-10 business days. You will be contacted about any denials we have received from your insurance company as well as alternatives recommended by your provider.  OVER THE COUNTER MEDICATION(S):   Please purchase the following medications over the counter and take as directed:  Marland Kitchen Miralax - please dissolve 1 capful in 8oz of water and drink daily  BMI:  . If you are age 75 or younger, your body mass index should be between 19-25. Your Body mass index is 39.99 kg/m. If this is out of the aformentioned range listed, please consider follow up with your Primary Care Provider.   Thank you for trusting me with your gastrointestinal care!    Thornton Park, MD, MPH

## 2020-11-30 NOTE — Progress Notes (Signed)
Following message sent to Rhys Martini and April Pait:  [facility] Phone: [phone] Fax: [phone]   Patient Name: Michele Gillespie DOB: [birth year] MRN #: [id number]  Imaging Ordered: Esophagram  Diagnosis: Dysphagia  Ordering Provider: Dr. Tarri Glenn  Is a Prior Authorization needed? We are in the process of obtaining it now  Is the patient Diabetic? No  Does the patient have Hypertension? Yes  Does the patient have any implanted devices or hardware? No  Date of last BUN/Creat, if needed? N/A  Patient Weight? 233#  Is the patient able to get on the table? Yes  Has the patient been diagnosed with COVID? No  Is the patient waiting on COVID testing results? No  Thank you for your assistance! [facility]

## 2020-11-30 NOTE — Telephone Encounter (Signed)
REQUEST TO HOLD COUMADIN    Michele Gillespie 05/21/1976 [id number]  Procedure: EGD and Colonoscopy Anesthesia type:  MAC Procedure Date: 01/17/21 Provider: Dr. Tarri Glenn  Type of Clearance needed: Pharmacy  Medication(s) needing held: Coumadin   Length of time for medication to be held: 5 days  Please review request and advise by either responding to this message or by sending your response to the fax # provided below.  Thank you,  [facility]  Phone: [phone] Fax: [phone] ATTENTION: Chanise Habeck, LPN

## 2020-11-30 NOTE — Progress Notes (Signed)
Following message sent to PCP:  [place] 03/31/1976 [id number]  Procedure: EGD and Colonoscopy Anesthesia type:  MAC Procedure Date: 01/17/21 Provider: Dr. Tarri Glenn  Type of Clearance needed: Pharmacy  Medication(s) needing held: Coumadin   Length of time for medication to be held: 5 days  Please review request and advise by either responding to this message or by sending your response to the fax # provided below.  Thank you,  [facility]  Phone: [phone] Fax: [phone] ATTENTION: Latricia Cerrito, LPN

## 2020-12-05 NOTE — Telephone Encounter (Signed)
Chart Review Routing History Since 12/07/2019 [facility] Full Routing History)  Recipients Sent On Sent By Routed Reports  Jose Persia, MD   12/05/2020 10:15 AM Aleatha Borer, LPN Telephone on 11/07/2058 with Aleatha Borer, LPN      Cover Page Message : Please review request and advise if appropriate.       Message has again been routed to PCP. Will continue to await response.

## 2020-12-08 ENCOUNTER — Telehealth: Payer: Self-pay | Admitting: *Deleted

## 2020-12-08 NOTE — Telephone Encounter (Signed)
Received call from [place], [place] - stated they have not received surgical clearance re: Coumadin form. I checked with front office - they have not seen form. I called [facility] back - requesting to re-send form.

## 2020-12-08 NOTE — Telephone Encounter (Signed)
Called Dr. Noni Saupe office to follow up on the status of this request sent x2. Nurse states she will "need to check into it". Will continue efforts to follow up.

## 2020-12-08 NOTE — Telephone Encounter (Signed)
Glenda -   Request can be found in [place], [place] 11/30/20. Please review and advise.

## 2020-12-08 NOTE — Telephone Encounter (Signed)
Hello Dr. Elie Confer,  This patient sees you for warfarin treatment.  Would you might approve this request to hold Coumadin?  Thank you,   Gaylan Gerold

## 2020-12-11 ENCOUNTER — Ambulatory Visit: Payer: Medicaid Other | Admitting: Pharmacist

## 2020-12-11 DIAGNOSIS — D6859 Other primary thrombophilia: Secondary | ICD-10-CM | POA: Diagnosis not present

## 2020-12-11 DIAGNOSIS — Z7901 Long term (current) use of anticoagulants: Secondary | ICD-10-CM | POA: Diagnosis not present

## 2020-12-11 LAB — POCT INR: INR: 3.1 — AB (ref 2.0–3.0)

## 2020-12-11 MED ORDER — WARFARIN SODIUM 5 MG PO TABS: ORAL_TABLET | ORAL | 1 refills | Status: DC

## 2020-12-11 MED ORDER — ENOXAPARIN SODIUM 100 MG/ML ~~LOC~~ SOLN: 100.0000 mg | Freq: Two times a day (BID) | SUBCUTANEOUS | 0 refills | Status: DC
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
## 2020-12-11 NOTE — Patient Instructions (Signed)
Patient instructed to take medications as defined in the Anti-coagulation Track section of this encounter.  Patient reported she took todays dose of warfarin prior to her appointment Patient instructed to take three (3) tablets of 5mg  on Sundays, Mondays, Wednesdays, Thursdays, and Saturdays. Take two and one half (2.5) tablets of 5mg  on Tuesdays and Fridays.  Patient verbalized understanding of these instructions.    Bridging Instructions, Pre-Colonoscopy and Post-Colonoscopy for Michele Gillespie  Female, 45 y.o., 12/15/1975  MRN:  [id number]  Planned date of Colonoscopy 17-January-2021.   1. Patient will be provided a prescription for low-molecular-weight-heparin (enoxaparin/Lovenox) to provide you 1mg  of enoxaparin/Lovenox per kilogram of body weight, to be administered every 12 hours.(100mg  dose/prefilled syringe[s].) 2. Last dose of warfarin (Coumadin) will be Friday, April 15th, 2022 at [place] low-molecular-weight-heparin (LMWH), enoxaparin/Lovenox on Sunday, April 17th , 2022 at 8:00AM. Your dose will be 100mg , administered subcutaneously, two-inches away from your "belly-button" at the level of the waistband, alternating sites (right, left, right.) 4. You will CONTINUE enoxaparin/Lovenox  on an 8:00AM/8:00PM schedule until your LAST DOSE, which will be the MORNING DOSE on Tuesday, April 19th, 2022 at 8:00AM. DO NOT ADMINISTER the evening dose on Tuesday, April 19th ! 5. Day of planned Colonoscopy procedure, Wednesday, April 20th, 2022--No LMWH (enoxaparin/Lovenox). 6. One day after Colonoscopy (24 hours) [Thursday, April 21st  re-commence enoxaparin/Lovenox 1mg /kg (100mg ) every 12 hours at 8:00AM and 8:00PM. 7. Re-commence warfarin (Coumadin) on Thursday, April 21st 2022. Take three (3)  of your 5mg  warfarin tablets by mouth, daily at [facility] until seen in the Internal Medicine Anticoagulation Clinic on Monday,  April 25th   2022 at 10:45 am for a fingerstick point of care INR  determination and all subsequent warfarin/Coumadin dosing will be based upon your INR that day. We will decide as to whether enoxaparin/Lovenox must continue, based upon your INR value. 8. Any questions regarding these instructions--call Pharmacist, [name], PharmD, CPP at [phone]. 9. If your surgery were to be cancelled or postponed, call Pharmacist, [name], PharmD, CPP at [phone].

## 2020-12-11 NOTE — Telephone Encounter (Signed)
Planned date of GI Medicine Procedure:  January 17, 2021  Last dose of warfarin (Coumadin) will be Friday, January 12, 2021 at [facility]. Day of GI Medicine Procedure, Wednesday, January 17, 2021. One day after GI procedure (24 hours) [Thursday, January 18, 2021], re-commence warfarin based upon your last dosing instructions provided. Continue these instructions and return to the Internal Medicine Anticoagulation Clinic on Monday,  January 22, 2021 at 10:30 am for a fingerstick point of care INR determination and all subsequent warfarin/Coumadin dosing will be based upon your INR that morning.  Any questions regarding these instructions--call Pharmacist, [name], PharmD, CPP at [phone]. If your GI medicine procedure were to be cancelled or postponed, call Pharmacist, [name], PharmD, CPP at [phone]. The patient has been provided a copy of these instructions.

## 2020-12-12 ENCOUNTER — Telehealth: Payer: Self-pay

## 2020-12-12 NOTE — Telephone Encounter (Signed)
Called pt to confirm that she received these instructions from her provider's office and did not have any further questions or concerns. Also routing to Dr. Tarri Glenn for continuity of care purposes.

## 2020-12-12 NOTE — Telephone Encounter (Signed)
SECOND ATTEMPT: ° °LVM requesting returned call. °

## 2020-12-12 NOTE — Telephone Encounter (Signed)
Thank you for the update!

## 2020-12-12 NOTE — Telephone Encounter (Signed)
-----   Message from Carolin Guernsey, [facility] sent at 12/11/2020 11:23 AM EDT ----- These are UPDATED instructions for bridging therapy. Thanks!  Bridging Instructions, Pre-Colonoscopy and Post-Colonoscopy for Khamya L. Otterson  Female, 45 y.o., 1976-05-04  MRN:  [id number]    Planned date of Colonoscopy 17-January-2021.   1. Patient will be provided a prescription for low-molecular-weight-heparin (enoxaparin/Lovenox) to provide you 1mg  of enoxaparin/Lovenox per kilogram of body weight, to be administered every 12 hours.(100mg  dose/prefilled syringe[s].) 2. Last dose of warfarin (Coumadin) will be Friday, April 15th, 2022 at [place] low-molecular-weight-heparin (LMWH), enoxaparin/Lovenox on Sunday, April 17th , 2022 at 8:00AM. Your dose will be 100mg , administered subcutaneously, two-inches away from your "belly-button" at the level of the waistband, alternating sites (right, left, right...) 4. You will CONTINUE enoxaparin/Lovenox  on an 8:00AM/8:00PM schedule until your LAST DOSE, which will be the MORNING DOSE on Tuesday, April 19th, 2022 at 8:00AM. DO NOT ADMINISTER the evening dose on Tuesday, April 19th ! 5. Day of planned Colonoscopy procedure, Wednesday, April 20th, 2022-No LMWH (enoxaparin/Lovenox). 6. One day after Colonoscopy (24 hours) [Thursday, April 21st  re-commence enoxaparin/Lovenox 1mg /kg (100mg ) every 12 hours at 8:00AM and 8:00PM. 7. Re-commence warfarin (Coumadin) on Thursday, April 21st 2022. Take three (3)  of your 5mg  warfarin tablets by mouth, daily at [facility] until seen in the Internal Medicine Anticoagulation Clinic on Monday,  April 25th   2022 at 10:45 am for a fingerstick point of care INR determination and all subsequent warfarin/Coumadin dosing will be based upon your INR that day. We will decide as to whether enoxaparin/Lovenox must continue, based upon your INR value. 8. Any questions regarding these instructions-call Pharmacist, [name], PharmD, CPP at  [phone]. 9. If your surgery were to be cancelled or postponed, call Pharmacist, [name], PharmD, CPP at [phone].

## 2020-12-12 NOTE — Telephone Encounter (Signed)
Called pt to verify she had received these instructions and that she did not have any questions or concerns. Message has also been routed to Dr. Tarri Glenn for continuity of care purposes. LVM w/ pt requesting returned call.

## 2020-12-13 ENCOUNTER — Other Ambulatory Visit: Payer: Self-pay

## 2020-12-13 ENCOUNTER — Ambulatory Visit (HOSPITAL_COMMUNITY)
Admission: RE | Admit: 2020-12-13 | Discharge: 2020-12-13 | Disposition: A | Discharge status: Home / Self Care | Payer: Medicaid Other | Source: Ambulatory Visit | Attending: Gastroenterology | Admitting: Gastroenterology

## 2020-12-13 ENCOUNTER — Encounter: Payer: Self-pay | Admitting: Gastroenterology

## 2020-12-13 DIAGNOSIS — R1013 Epigastric pain: Secondary | ICD-10-CM | POA: Insufficient documentation

## 2020-12-13 DIAGNOSIS — R1310 Dysphagia, unspecified: Secondary | ICD-10-CM | POA: Diagnosis present

## 2020-12-13 DIAGNOSIS — K219 Gastro-esophageal reflux disease without esophagitis: Secondary | ICD-10-CM | POA: Insufficient documentation

## 2020-12-13 DIAGNOSIS — K5900 Constipation, unspecified: Secondary | ICD-10-CM | POA: Insufficient documentation

## 2020-12-13 NOTE — Progress Notes (Signed)
INTERNAL MEDICINE TEACHING ATTENDING ADDENDUM - Nischal Narendra M.D  Duration- indefinite, Indication- recurrent VTE, INR- supratherapeutic. Agree with pharmacy recommendations as outlined in their note.     

## 2020-12-13 NOTE — Telephone Encounter (Signed)
FINAL ATTEMPT:  LVM requesting returned call. Letter has been sent via My Chart as well:  Letter by Thornton Park, MD on 12/13/2020     [facility] [place], [place]  [address] Phone:  [phone]   Fax:  [phone]     MRN: [id number] [place] [address]     Date: 12/13/2020   Dear Ms. Thornsberry,   We have been unsuccessful in our attempts to contact you.  We would like to ensure that you have received the following information from Dr. Gladstone Pih office. Please refer below. If you have any questions or concerns, please call Dr. Gladstone Pih office:   Planned date of GI Medicine Procedure:  January 17, 2021   Last dose of warfarin (Coumadin) will be Friday, January 12, 2021 at [facility]. Day of GI Medicine Procedure, Wednesday, January 17, 2021. One day after GI procedure (24 hours) [Thursday, January 18, 2021], re-commence warfarin based upon your last dosing instructions provided. Continue these instructions and return to the Internal Medicine Anticoagulation Clinic on Monday,  January 22, 2021 at 10:30 am for a fingerstick point of care INR determination and all subsequent warfarin/Coumadin dosing will be based upon your INR that morning.  Any questions regarding these instructions--call Pharmacist, [name], PharmD, CPP at [phone]. If your GI medicine procedure were to be cancelled or postponed, call Pharmacist, [name], PharmD, CPP at [phone]. The patient has been provided a copy of these instructions.    Thank you for trusting me with your gastrointestinal care!     Sincerely,   Thornton Park, MD, MPH
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
## 2020-12-26 ENCOUNTER — Ambulatory Visit: Payer: Medicaid Other | Admitting: Internal Medicine

## 2020-12-26 ENCOUNTER — Other Ambulatory Visit: Payer: Self-pay

## 2020-12-26 ENCOUNTER — Encounter: Payer: Self-pay | Admitting: Internal Medicine

## 2020-12-26 VITALS — BP 118/87 | HR 79 | Ht 64.5 in | Wt 233.0 lb

## 2020-12-26 DIAGNOSIS — M359 Systemic involvement of connective tissue, unspecified: Secondary | ICD-10-CM

## 2020-12-26 DIAGNOSIS — R3129 Other microscopic hematuria: Secondary | ICD-10-CM

## 2020-12-26 NOTE — Patient Instructions (Signed)
Urine Protein Test Why am I having this test? A urine protein test may be used to check the health of your kidneys and to look for kidney damage or kidney disease. The test may be done as part of a normal screening or if you have a condition that affects your kidneys, such as:  Diabetes.  High blood pressure (hypertension).  Urinary tract infection.  Problems resulting from pregnancy, including preeclampsia. Your health care provider may also do this test if you have symptoms of kidney disease. These can include:  Fluid retention, also called edema.  Tiredness, or fatigue.  Nausea. What is being tested? This test measures the amount of protein in your urine. Your blood contains proteins. When your blood is filtered by your kidneys, most of the proteins should be returned to your blood and there should be no protein or very little protein in your urine. If you have a large amount of protein in your urine (proteinuria), your kidneys may not be working as well as they should. What kind of sample is taken? This test requires a urine sample. Two methods may be used for the test:  A dipstick test is often the first step in a urine protein test. After you urinate into a germ-free (sterile) cup, your health care provider will dip a test strip into your sample. The health care provider can tell right away if there is protein in your urine.  Your health care provider may do a 24-hour urine test if the dipstick test shows that there is a large amount of protein in your urine. This test measures the amount of protein that is released in your urine over a 24-hour period. You may also have other tests to find out the types of proteins you have in your urine.   How do I collect samples at home? If your health care provider wants to do a 24-hour urine test, you may be asked to collect urine samples at home over a 24-hour period. Follow instructions from a health care provider about how to collect the  samples. When collecting a urine sample at home, make sure you:  Use supplies and instructions that you received from the lab.  Collect urine only in the sterile cup that you received from the lab.  Do not let any toilet paper or stool (feces) get into the cup.  Refrigerate the sample or keep it on ice until you can return it to the lab.  Return the sample(s) to the lab as instructed.   Tell a health care provider about:  All medicines you are taking, including vitamins, herbs, eye drops, creams, and over-the-counter medicines.  Any medical conditions you have. Some conditions can cause you to have protein in your urine.  Whether you have had a radiology scan with contrast dye.  Whether you have exercised heavily in the last few days.  Whether you are pregnant or may be pregnant. How are the results reported? The result of the urine dipstick test for proteins will be reported as either positive or negative.  A negative result means no protein or a small amount of protein was found in your urine.  A positive result means a large amount of protein was found in your urine. The result of the 24-hour urine test will be reported as a value that indicates the amount of protein in your urine. Your health care provider will compare your results to normal ranges that were established after testing a large group of  people (reference ranges). Reference ranges may vary among labs and hospitals. For someone at rest, 50-80 mg per 24 hours is considered normal. For someone exercising, 50-250 mg per 24 hours is considered normal. What do the results mean? Many conditions can cause a level of protein in your urine that is higher than normal, such as:  Dehydration.  Doing exercise that takes a lot of effort (is vigorous).  Urinary tract infection.  High blood pressure, heart failure, or diabetes.  Kidney disease.  Bladder cancer.  Preeclampsia, in pregnant women. Talk with your health care  provider about what your results mean. Questions to ask your health care provider Ask your health care provider, or the department that is doing the test:  When will my results be ready?  How will I get my results?  What are my treatment options?  What other tests do I need?  What are my next steps? Summary  The urine protein test is often used to screen for kidney disease.  This test measures the amount of protein in your urine. If you have a large amount of protein in your urine (proteinuria), your kidneys may not be working as well as they should.  Your health care provider may do a 24-hour urine test if the dipstick test shows that there is a large amount of protein in your urine.  Talk with your health care provider about what your results mean.    Urinalysis Test Why am I having this test? You may have a urinalysis (UA) test:  As part of routine wellness screening.  Before surgery.  During pregnancy. You may also need to have this test if you have:  Kidney disease.  Symptoms of a urinary tract infection (UTI).  Diabetes.  A condition that causes an imbalance in your hormones. What is being tested? A urinalysis is a series of tests done on a sample of your urine. Your kidneys filter your blood to make urine. They get rid of waste products and save the important parts of your blood, such as proteins and minerals (electrolytes). You may need more testing if your UA shows too much:  Protein.  Sugar (glucose).  Blood cells.  Bacteria. What kind of sample is taken? A urine sample is required for this test. How do I collect samples at home? You usually collect a urine sample by urinating into a clean cup. You may be asked to collect a urine sample first thing in the morning. When collecting a urine sample at home, make sure you:  Use supplies and instructions that you received from the lab.  Collect urine only in the germ-free (sterile) cup that you  received from the lab.  Do not let any toilet paper or stool (feces) get into the cup.  Refrigerate the sample until you can return it to the lab.  Return the sample or samples to the lab as told.   Tell a health care provider about:  All medicines you are taking, including vitamins, herbs, eye drops, creams, and over-the-counter medicines.  Any medical conditions you have.  Whether you are pregnant or may be pregnant. What happens during the test? UA is divided into three parts:  A visual exam of your urine sample to check for color or cloudiness.  A dipstick test to check for: ? Proteins. ? Concentration (specific gravity). ? Acidity (pH). ? Glucose. ? Ketones. These are by-products of your body burning fat for energy instead of sugar. ? A waste product from red  blood cells (bilirubin). ? A product of white blood cells (leukocyte esterase). ? A product of bacteria (nitrite). ? Blood.  A microscopic exam to check for: ? Red blood cells. ? White blood cells. ? Tube-shaped proteins (hyaline casts). ? Crystal structures. ? Bacteria. ? Epithelial cells. These are cells that line your urinary tract. ? Yeast. How are the results reported? Some of your test results will be reported as values. Your health care provider will compare your results to normal ranges that were established after testing a large group of people (reference ranges). Reference ranges may vary among different labs and hospitals. For this test, common reference ranges are:  pH: 4.6-8.0 (average, 6.0).  Protein: ? 0-8 mg/dL. ? 50-80 mg/24 hr (at rest). ? Less than 250 mg/24 hr (during exercise).  Specific gravity: ? Adult: 1.005-1.030 (usually, 1.010-1.025). ? Elderly: values decrease with age. ? Newborn: 1.001-1.020.  Nitrites: none.  Ketones: none.  Bilirubin: none.  Urobilinogen: 5.78-4 Ehrlich unit/mL.  Crystals: none.  Casts: none.  Glucose: ? Fresh specimen: none. ? 24-hour  specimen: 50-300 mg/24 hr or 0.3-1.7 mmol/day (SI units).  White blood cells (WBCs): 0-4 per low-power field.  WBC casts: none.  Red blood cells (RBCs): Less than or equal to 2.  RBC casts: none.  Epithelial cells: Few or 0-4 per low-power field.  Bacteria: none.  Yeast: none. Other results may be reported based on the appearance and odor of the sample. For this test, normal results are:  Appearance: clear.  Color: amber yellow.  Odor: aromatic. Still other results may be reported as positive or negative. For this test, normal results are:  Negative for leukocyte esterase. What do the results mean? Many conditions can cause abnormal UA results:  Cloudy urine may be a sign of a UTI.  Acetone odor may indicate a buildup of blood acids in people who have diabetes (diabetic ketoacidosis).  Fecal odor can indicate an abnormal connection (fistula) between the intestine and the bladder.  Ammonia odor can occur after a person holds urine in the bladder for too long.  Pungent odor may indicate a UTI.  Blood in the urine may be a sign of kidney disease, UTI, or other conditions.  White blood cells may be a sign of a UTI.  Crystals may be a sign of a kidney stone or other kidney disease.  High pH may mean you have a kidney stone, UTI, or kidney disease.  Protein may be a sign of kidney disease, high blood pressure in pregnancy (toxemia), or other conditions.  Glucose may be a sign of diabetes.  Urobilinogen may be a sign of liver disease.  Leukocyte esterase may indicate a UTI.  Nitrites may indicate a UTI. Talk with your health care provider about what your results mean. Questions to ask your health care provider Ask your health care provider, or the department that is doing the test:  When will my results be ready?  How will I get my results?  What are my treatment options?  What other tests do I need?  What are my next steps? Summary  A urinalysis (UA) is  a series of tests done on a sample of your urine. The test may be ordered as part of a routine exam, during pregnancy, before surgery, or if you have certain symptoms.  The urinalysis is divided into three parts: a visual exam, a dipstick test, and a microscopic exam.  Your health care provider will compare your results to normal ranges that were  established after testing a large group of people (reference ranges).  Talk with your health care provider about what your results mean.    Anti-DNA Antibody Test Why am I having this test? The anti-DNA antibody test helps with the diagnosis and follow-up of systemic lupus erythematosus (SLE). It is also used to monitor treatment of this condition as the antibody decreases with successful therapy. What is being tested? This test measures the amount of anti-DNA antibody in the blood. This antibody is found in 65-80% of patients with active SLE. This antibody is not as common in patients who have other diseases. What kind of sample is taken? A blood sample is required for this test. It is usually collected by inserting a needle into a blood vessel.   How are the results reported? Your test results will be reported as a value. Your test results may also be reported as positive, intermediate, or negative. Your health care provider will compare your results to normal ranges that were established after testing a large group of people (reference values). Reference values may vary among labs and hospitals. For this test, common reference values are:  Positive: 10 or more international units/mL.  Intermediate: 5-9 international units/mL.  Negative: Less than 5 international units/mL. What do the results mean? Positive results, which are associated with results that are higher than the reference values, may indicate:  Autoimmune disorders such as SLE.  Infectious mononucleosis.  Chronic liver conditions. Intermediate results mean that the anti-DNA  antibody levels are higher than normal, but not high enough to be considered positive. Negative results mean that you do not have the anti-DNA antibody that is associated with these conditions. Talk with your health care provider about what your results mean. Questions to ask your health care provider Ask your health care provider, or the department that is doing the test:  When will my results be ready?  How will I get my results?  What are my treatment options?  What other tests do I need?  What are my next steps? Summary  The anti-DNA antibody test helps with the diagnosis and follow-up of systemic lupus erythematosus (SLE). It is also used to monitor treatment of this condition as the antibody decreases with successful therapy.  This test measures the amount of anti-DNA antibody in the blood.  Elevated levels of anti-DNA antibody can be seen in patients with SLE and certain other conditions.    Complement Assay Test Why am I having this test? Complement refers to a group of proteins that are part of the body's disease-fighting system (immune system). A complement assay test provides information about some or all of these proteins. You may have this test:  To diagnose a lack, or deficiency, of certain complement proteins. Deficiencies can be passed from parent to child (inherited).  To monitor an infection or autoimmune disease.  If you have unexplained inflammation or swelling (edema).  If you have bacterial infections again and again. What is being tested? This test can be used to measure:  Total complement. This is the total number of protein complements in your blood.  The number of each kind of complement in your blood. The nine main kinds of complement are labeled C1 through C9. Some of these complements, such as C3 and C4, are especially important and have many functions in the body. Depending on why you are having the test, your health care provider may test your  total complement or only some individual complements, such as C3 and  C4. The total complement assay test may be done before individual complements are tested. What kind of sample is taken? A blood sample is required for this test. It is usually collected by inserting a needle into a blood vessel.   Tell a health care provider about:  Any allergies you have.  All medicines you are taking, including vitamins, herbs, eye drops, creams, and over-the-counter medicines.  Any blood disorders you have.  Any surgeries you have had.  Any medical conditions you have.  Whether you are pregnant or may be pregnant. How are the results reported? Your results will be reported as a value that tells you how much complement is in your blood. This will be given as units per milliliter of blood (units/mL) or as milligrams per deciliter of blood (mg/dL). Your results may be reported as total complement, or as individual complements, or both. Your health care provider will compare your results to normal ranges that were established after testing a large group of people (reference ranges). Reference ranges may vary among labs and hospitals. For this test, reference ranges for some of the most commonly measured complement assays may be:  Total complement: 30-75 units/mL.  C2: 1-4 mg/dL.  C3: 75-175 mg/dL.  C4: 22-45 units/mL. What do the results mean? Results within reference ranges are considered normal, which means you have a normal amount of complement in your blood. Results that are higher than the reference ranges may be caused by:  Inflammatory disease.  Heart attack.  Cancer. Complement deficiencies, or results lower than the reference ranges, may be caused by:  Certain inherited conditions.  Autoimmune disease.  Certain liver diseases.  Malnutrition.  Certain types of anemia that result in breakdown of red blood cells (hemolytic anemia). Talk with your health care provider about what  your results mean. Questions to ask your health care provider Ask your health care provider, or the department that is doing the test:  When will my results be ready?  How will I get my results?  What are my treatment options?  What other tests do I need?  What are my next steps? Summary  Complement refers to a group of proteins that are part of the body's disease-fighting system (immune system). A complement assay test can provide information about some or all of these proteins.  You may have a complement assay test to help diagnose a complement deficiency, and to monitor some infections or autoimmune disease.  Talk with your health care provider about what your results mean. This information is not intended to replace advice given to you by your health care provider. Make sure you discuss any questions you have with your health care provider. Document Revised: 07/13/2020 Document Reviewed: 07/13/2020 Elsevier Patient Education  2021 [employer].

## 2020-12-27 LAB — URINALYSIS, ROUTINE W REFLEX MICROSCOPIC
Bilirubin Urine: NEGATIVE
Glucose, UA: NEGATIVE
Hgb urine dipstick: NEGATIVE
Ketones, ur: NEGATIVE
Leukocytes,Ua: NEGATIVE
Nitrite: NEGATIVE
Protein, ur: NEGATIVE
Specific Gravity, Urine: 1.025 (ref 1.001–1.03)
pH: <=5 (ref 5.0–8.0)

## 2020-12-27 LAB — C3 AND C4
C3 Complement: 157 mg/dL (ref 83–193)
C4 Complement: 38 mg/dL (ref 15–57)

## 2020-12-27 LAB — PROTEIN / CREATININE RATIO, URINE
Creatinine, Urine: 196 mg/dL (ref 20–275)
Protein/Creat Ratio: 92 mg/g creat (ref 21–161)
Protein/Creatinine Ratio: 0.092 mg/mg creat (ref 0.021–0.16)
Total Protein, Urine: 18 mg/dL (ref 5–24)

## 2020-12-27 LAB — SEDIMENTATION RATE: Sed Rate: 2 mm/h (ref 0–20)

## 2020-12-27 LAB — ANTI-DNA ANTIBODY, DOUBLE-STRANDED: ds DNA Ab: 1 IU/mL

## 2021-01-11 ENCOUNTER — Telehealth: Payer: Self-pay | Admitting: Neurology

## 2021-01-11 NOTE — Telephone Encounter (Signed)
Pt has called in an attempt to schedule his f/u with Dr Jannifer Franklin. Under the advisement of Daiva Huge pt was told that a message will be sent to Dr Jannifer Franklin re: who his f/u needs to be with since there is not future availability with Dr Jannifer Franklin. Pt will await a response from Dr Jannifer Franklin

## 2021-01-15 NOTE — Telephone Encounter (Signed)
LVM informing pt of opening this week on 01/17/21 with Judson Roch, NP. Advised to call back if she needs a different day or time.

## 2021-01-17 ENCOUNTER — Ambulatory Visit (AMBULATORY_SURGERY_CENTER): Payer: Medicaid Other | Admitting: Gastroenterology

## 2021-01-17 ENCOUNTER — Encounter: Payer: Self-pay | Admitting: Gastroenterology

## 2021-01-17 ENCOUNTER — Other Ambulatory Visit: Payer: Self-pay

## 2021-01-17 ENCOUNTER — Other Ambulatory Visit: Payer: Self-pay | Admitting: Gastroenterology

## 2021-01-17 ENCOUNTER — Ambulatory Visit: Payer: Medicaid Other | Admitting: Neurology

## 2021-01-17 VITALS — BP 124/83 | HR 71 | Temp 97.5°F | Resp 16 | Ht 64.0 in | Wt 233.0 lb

## 2021-01-17 DIAGNOSIS — K2970 Gastritis, unspecified, without bleeding: Secondary | ICD-10-CM

## 2021-01-17 DIAGNOSIS — R1310 Dysphagia, unspecified: Secondary | ICD-10-CM

## 2021-01-17 DIAGNOSIS — D122 Benign neoplasm of ascending colon: Secondary | ICD-10-CM

## 2021-01-17 DIAGNOSIS — K259 Gastric ulcer, unspecified as acute or chronic, without hemorrhage or perforation: Secondary | ICD-10-CM

## 2021-01-17 DIAGNOSIS — K2980 Duodenitis without bleeding: Secondary | ICD-10-CM

## 2021-01-17 DIAGNOSIS — K5900 Constipation, unspecified: Secondary | ICD-10-CM

## 2021-01-17 DIAGNOSIS — K3189 Other diseases of stomach and duodenum: Secondary | ICD-10-CM | POA: Diagnosis not present

## 2021-01-17 DIAGNOSIS — K2950 Unspecified chronic gastritis without bleeding: Secondary | ICD-10-CM | POA: Diagnosis not present

## 2021-01-17 MED ORDER — SODIUM CHLORIDE 0.9 % IV SOLN: 500.0000 mL | Freq: Once | INTRAVENOUS | Status: DC

## 2021-01-17 MED ORDER — OMEPRAZOLE 40 MG PO CPDR
40.0000 mg | DELAYED_RELEASE_CAPSULE | Freq: Two times a day (BID) | ORAL | 1 refills | Status: DC
Start: 2021-01-17 — End: 2021-06-25

## 2021-01-17 NOTE — Progress Notes (Signed)
A and O x3. Report to RN. Tolerated MAC anesthesia well.Teeth unchanged after procedure.

## 2021-01-17 NOTE — Progress Notes (Signed)
Called to room to assist during endoscopic procedure.  Patient ID and intended procedure confirmed with present staff. Received instructions for my participation in the procedure from the performing physician.  

## 2021-01-17 NOTE — Op Note (Signed)
[facility] Patient Name: Michele Gillespie Procedure Date: 01/17/2021 2:02 PM MRN: [id number] Endoscopist: Thornton Park MD, MD Age: 45 Referring MD:  Date of Birth: Jun 26, 1976 Gender: Female Account #: [id number] Procedure:                Upper GI endoscopy Indications:              Dysphagia with normal esophagram Medicines:                Monitored Anesthesia Care Procedure:                Pre-Anesthesia Assessment:                           - Prior to the procedure, a History and Physical                            was performed, and patient medications and                            allergies were reviewed. The patient's tolerance of                            previous anesthesia was also reviewed. The risks                            and benefits of the procedure and the sedation                            options and risks were discussed with the patient.                            All questions were answered, and informed consent                            was obtained. Prior Anticoagulants: The patient has                            taken Lovenox (enoxaparin), last dose was 1 day                            prior to procedure. ASA Grade Assessment: III - A                            patient with severe systemic disease. After                            reviewing the risks and benefits, the patient was                            deemed in satisfactory condition to undergo the                            procedure.  After obtaining informed consent, the endoscope was                            passed under direct vision. Throughout the                            procedure, the patient's blood pressure, pulse, and                            oxygen saturations were monitored continuously. The                            Endoscope was introduced through the mouth, and                            advanced to the third part of duodenum. The upper                             GI endoscopy was accomplished without difficulty.                            The patient tolerated the procedure well. Scope In: Scope Out: Findings:                 The Z-line was regular and was found 36 cm from the                            incisors. Biopsies were obtained from the                            mid/proximal and distal esophagus with cold forceps                            for histology. Estimated blood loss was minimal.                           Multiple erosions with no bleeding and no stigmata                            of recent bleeding were found in the gastric                            antrum. Biopsies were taken from the antrum, body,                            and fundus with a cold forceps for histology.                            Estimated blood loss was minimal.                           One non-bleeding cratered gastric ulcer with  pigmented material was found in the gastric antrum.                            The lesion was 3 mm in largest dimension.                           Diffuse mildly erythematous mucosa without active                            bleeding and with no stigmata of bleeding was found                            in the duodenal bulb. Biopsies were taken with a                            cold forceps for histology. Estimated blood loss                            was minimal.                           The cardia and gastric fundus were normal on                            retroflexion.                           The exam was otherwise without abnormality. Complications:            No immediate complications. Estimated blood loss:                            Minimal. Estimated Blood Loss:     Estimated blood loss was minimal. Impression:               - Z-line regular, 36 cm from the incisors. Biopsied.                           - Erosive gastropathy with no bleeding and no                             stigmata of recent bleeding. Biopsied.                           - Non-bleeding gastric ulcer with pigmented                            material.                           - Erythematous duodenopathy. Biopsied.                           - The examination was otherwise normal. Recommendation:           - Patient has a contact number available for  emergencies. The signs and symptoms of potential                            delayed complications were discussed with the                            patient. Return to normal activities tomorrow.                            Written discharge instructions were provided to the                            patient.                           - Resume previous diet.                           - Continue present medications. Increase omeprazole                            to 40 mg BID.                           - Avoid all NSAIDs.                           - Await pathology results.                           - Proceed with colonoscopy as previously planned. Thornton Park MD, MD 01/17/2021 2:33:45 PM This report has been signed electronically.

## 2021-01-17 NOTE — Op Note (Signed)
[facility] Patient Name: Finlay Mills Procedure Date: 01/17/2021 2:01 PM MRN: [id number] Endoscopist: Thornton Park MD, MD Age: 45 Referring MD:  Date of Birth: 04/29/1976 Gender: Female Account #: [id number] Procedure:                Colonoscopy Indications:              Screening for colorectal malignant neoplasm, This                            is the patient's first colonoscopy                           No known family history of colon cancer or polyps Medicines:                Monitored Anesthesia Care Procedure:                Pre-Anesthesia Assessment:                           - Prior to the procedure, a History and Physical                            was performed, and patient medications and                            allergies were reviewed. The patient's tolerance of                            previous anesthesia was also reviewed. The risks                            and benefits of the procedure and the sedation                            options and risks were discussed with the patient.                            All questions were answered, and informed consent                            was obtained. Prior Anticoagulants: The patient has                            taken Lovenox (enoxaparin), last dose was 1 day                            prior to procedure. ASA Grade Assessment: III - A                            patient with severe systemic disease. After                            reviewing the risks and benefits, the patient was  deemed in satisfactory condition to undergo the                            procedure.                           After obtaining informed consent, the colonoscope                            was passed under direct vision. Throughout the                            procedure, the patient's blood pressure, pulse, and                            oxygen saturations were monitored continuously. The                             Olympus CF-HQ190 [id number]) Colonoscope was                            introduced through the anus and advanced to the 3                            cm into the ileum. The colonoscopy was performed                            without difficulty. The patient tolerated the                            procedure well. The quality of the bowel                            preparation was good. The terminal ileum, ileocecal                            valve, appendiceal orifice, and rectum were                            photographed. Scope In: 2:21:33 PM Scope Out: 2:30:14 PM Scope Withdrawal Time: 0 hours 7 minutes 26 seconds  Total Procedure Duration: 0 hours 8 minutes 41 seconds  Findings:                 The perianal and digital rectal examinations were                            normal.                           Non-bleeding internal hemorrhoids were found.                           Multiple small and large-mouthed diverticula were  found through the colon, but most pronounced in the                            sigmoid colon and descending colon. There is no                            diverticulitis.                           A 1-2 mm polyp was found in the ascending colon.                            The polyp was sessile. The polyp was removed with a                            cold snare. Resection and retrieval were complete.                            Estimated blood loss was minimal.                           The exam was otherwise without abnormality on                            direct and retroflexion views. Complications:            No immediate complications. Estimated blood loss:                            Minimal. Estimated Blood Loss:     Estimated blood loss was minimal. Impression:               - Non-bleeding internal hemorrhoids.                           - Pancolonic diverticulosis, most severe in the                             sigmoid colon and in the descending colon.                           - One 1-2 mm polyp in the ascending colon, removed                            with a cold snare. Resected and retrieved.                           - The examination was otherwise normal on direct                            and retroflexion views. Recommendation:           - Patient has a contact number available for  emergencies. The signs and symptoms of potential                            delayed complications were discussed with the                            patient. Return to normal activities tomorrow.                            Written discharge instructions were provided to the                            patient.                           - Resume previous diet. High fiber diet encouraged.                           - Continue present medications.                           - Resume Lovenox tomorrow. Warfarin should be                            resumed as instructed by the prescription doctor.                           - Await pathology results.                           - Repeat colonoscopy date to be determined after                            pending pathology results are reviewed for                            surveillance.                           - Emerging evidence supports eating a diet of                            fruits, vegetables, grains, calcium, and yogurt                            while reducing red meat and alcohol may reduce the                            risk of colon cancer.                           - Thank you for allowing me to be involved in your                            colon cancer prevention. Thornton Park MD, MD 01/17/2021 2:38:42 PM This  report has been signed electronically.

## 2021-01-17 NOTE — Progress Notes (Signed)
VS- Michele Gillespie  Pt has piercing under left side of lip- she states "I absolutely cannot take it out." I explained that she will have a bite block placed for the EGD and she states "it'll be fine.  I can't take it out."  Gaye Pollack CRNA made aware and visualizes piercing site

## 2021-01-17 NOTE — Patient Instructions (Signed)
HANDOUTS PROVIDED ON: GASTRITIS, POLYPS, DIVERTICULOSIS, & HEMORRHOIDS  The polyps removed/biopsies taken today have been sent for pathology.  The results can take 1-3 weeks to receive.  When your next colonoscopy should occur will be based on the pathology results.    You may resume your previous diet.  RESUME YOUR LOVENOX TOMORROW (Thursday 4/21), WARFARIN SHOULD BE RESUMED AS INSTRUCTED BY THE PRESCRIPTIVE DOCTOR.  NO ASPIRIN, IBUPROFEN, NAPROXEN, OR OTHER NSAIDs.  Thank you for allowing [place] to care for you today!!!   YOU HAD AN ENDOSCOPIC PROCEDURE TODAY AT [facility]:   Refer to the procedure report that was given to you for any specific questions about what was found during the examination.  If the procedure report does not answer your questions, please call your gastroenterologist to clarify.  If you requested that your care partner not be given the details of your procedure findings, then the procedure report has been included in a sealed envelope for you to review at your convenience later.  YOU SHOULD EXPECT: Some feelings of bloating in the abdomen. Passage of more gas than usual.  Walking can help get rid of the air that was put into your GI tract during the procedure and reduce the bloating. If you had a lower endoscopy (such as a colonoscopy or flexible sigmoidoscopy) you may notice spotting of blood in your stool or on the toilet paper. If you underwent a bowel prep for your procedure, you may not have a normal bowel movement for a few days.  Please Note:  You might notice some irritation and congestion in your nose or some drainage.  This is from the oxygen used during your procedure.  There is no need for concern and it should clear up in a day or so.  SYMPTOMS TO REPORT IMMEDIATELY:   Following lower endoscopy (colonoscopy or flexible sigmoidoscopy):  Excessive amounts of blood in the stool  Significant tenderness or worsening of abdominal pains  Swelling of  the abdomen that is new, acute  Fever of 100F or higher   Following upper endoscopy (EGD)  Vomiting of blood or coffee ground material  New chest pain or pain under the shoulder blades  Painful or persistently difficult swallowing  New shortness of breath  Fever of 100F or higher  Black, tarry-looking stools  For urgent or emergent issues, a gastroenterologist can be reached at any hour by calling [phone]. Do not use MyChart messaging for urgent concerns.    DIET:  We do recommend a small meal at first, but then you may proceed to your regular diet.  Drink plenty of fluids but you should avoid alcoholic beverages for 24 hours.  ACTIVITY:  You should plan to take it easy for the rest of today and you should NOT DRIVE or use heavy machinery until tomorrow (because of the sedation medicines used during the test).    FOLLOW UP: Our staff will call the number listed on your records Friday morning between 7:15 am and 8:15 am following your procedure to check on you and address any questions or concerns that you may have regarding the information given to you following your procedure. If we do not reach you, we will leave a message.  We will attempt to reach you two times.  During this call, we will ask if you have developed any symptoms of COVID 19. If you develop any symptoms (ie: fever, flu-like symptoms, shortness of breath, cough etc.) before then, please call [phone].  If you test positive for Covid 19 in the 2 weeks post procedure, please call and report this information to [place].    If any biopsies were taken you will be contacted by phone or by letter within the next 1-3 weeks.  Please call us at [phone] if you have not heard about the biopsies in 3 weeks.    SIGNATURES/CONFIDENTIALITY: You and/or your care partner have signed paperwork which will be entered into your electronic medical record.  These signatures attest to the fact that that the information above on  your After Visit Summary has been reviewed and is understood.  Full responsibility of the confidentiality of this discharge information lies with you and/or your care-partner.

## 2021-01-18 ENCOUNTER — Other Ambulatory Visit: Payer: Self-pay | Admitting: Internal Medicine

## 2021-01-18 DIAGNOSIS — F325 Major depressive disorder, single episode, in full remission: Secondary | ICD-10-CM

## 2021-01-18 DIAGNOSIS — J309 Allergic rhinitis, unspecified: Secondary | ICD-10-CM

## 2021-01-18 MED ORDER — ALBUTEROL SULFATE HFA 108 (90 BASE) MCG/ACT IN AERS: INHALATION_SPRAY | RESPIRATORY_TRACT | 0 refills | Status: DC

## 2021-01-18 NOTE — Telephone Encounter (Signed)
MEDICATION REFILL REQUEST  albuterol (PROVENTIL HFA) 108 (90 Base) MCG/ACT inhaler  ARIPiprazole (ABILIFY) 10 MG tablet   [id number] [place], [place] - [address] [place] Phone:  [phone]  Fax:  [phone]

## 2021-01-19 ENCOUNTER — Telehealth: Payer: Self-pay | Admitting: *Deleted

## 2021-01-19 NOTE — Telephone Encounter (Signed)
  Follow up Call-  Call back number 01/17/2021  Post procedure Call Back phone  # [phone]  Permission to leave phone message Yes  Some recent data might be hidden     Patient questions:  Do you have a fever, pain , or abdominal swelling? No. Pain Score  0 *  Have you tolerated food without any problems? Yes.    Have you been able to return to your normal activities? Yes.    Do you have any questions about your discharge instructions: Diet   No. Medications  No. Follow up visit  No.  Do you have questions or concerns about your Care? No.  Actions: * If pain score is 4 or above: No action needed, pain <4.  1. Have you developed a fever since your procedure? no  2.   Have you had an respiratory symptoms (SOB or cough) since your procedure? no  3.   Have you tested positive for COVID 19 since your procedure no  4.   Have you had any family members/close contacts diagnosed with the COVID 19 since your procedure?  no   If yes to any of these questions please route to Joylene John, RN and Joella Prince, RN

## 2021-01-22 ENCOUNTER — Ambulatory Visit: Payer: Medicaid Other | Admitting: Pharmacist

## 2021-01-22 DIAGNOSIS — D6859 Other primary thrombophilia: Secondary | ICD-10-CM

## 2021-01-22 DIAGNOSIS — Z7901 Long term (current) use of anticoagulants: Secondary | ICD-10-CM | POA: Diagnosis not present

## 2021-01-22 LAB — POCT INR: INR: 1.8 — AB (ref 2.0–3.0)

## 2021-01-22 MED ORDER — WARFARIN SODIUM 5 MG PO TABS: ORAL_TABLET | ORAL | 1 refills | Status: DC

## 2021-01-22 NOTE — Patient Instructions (Signed)
Patient instructed to take medications as defined in the Anti-coagulation Track section of this encounter.  Patient instructed to take today's dose.  Patient instructed to take  three (3) tablets of 5mg  on Sundays, Mondays, Wednesdays, Thursdays, and Saturdays. Take two and one half (2.5) tablets of 5mg  on Tuesdays and Fridays. Patient verbalized understanding of these instructions.

## 2021-01-22 NOTE — Progress Notes (Signed)
I agree with Dr. Groce's assessment and plan as documented.   
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
## 2021-02-05 ENCOUNTER — Other Ambulatory Visit: Payer: Self-pay | Admitting: Internal Medicine

## 2021-02-05 ENCOUNTER — Ambulatory Visit: Payer: Medicaid Other

## 2021-02-05 DIAGNOSIS — D6859 Other primary thrombophilia: Secondary | ICD-10-CM

## 2021-02-05 DIAGNOSIS — Z7901 Long term (current) use of anticoagulants: Secondary | ICD-10-CM

## 2021-02-05 DIAGNOSIS — F325 Major depressive disorder, single episode, in full remission: Secondary | ICD-10-CM

## 2021-02-05 LAB — POCT INR: INR: 3.1 — AB (ref 2.0–3.0)

## 2021-02-05 MED ORDER — ARIPIPRAZOLE 10 MG PO TABS: 10.0000 mg | ORAL_TABLET | Freq: Every day | ORAL | 0 refills | Status: DC

## 2021-02-05 NOTE — Telephone Encounter (Signed)
Hi Dr. Charleen Kirks,  Per LOV w/ Dr. Konrad Penta on 10/09/20 it's noted she is to continue Abilify.  I don't see any recent RX's, or where its been prescribed by [facility].  She has an upcoming appt w/ you on 02/12/21. Please review request. Thank you, Sheppard Luckenbach

## 2021-02-05 NOTE — Telephone Encounter (Signed)
Refill Request  ARIPiprazole (ABILIFY) 10 MG tablet   [id number] [place], [place] - [address] (Ph: [phone])
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
## 2021-02-12 ENCOUNTER — Encounter: Payer: Self-pay | Admitting: Internal Medicine

## 2021-02-12 ENCOUNTER — Ambulatory Visit: Payer: Medicaid Other | Admitting: Internal Medicine

## 2021-02-12 ENCOUNTER — Other Ambulatory Visit: Payer: Self-pay

## 2021-02-12 VITALS — BP 123/87 | HR 83 | Temp 98.6°F | Ht 64.0 in | Wt 323.7 lb

## 2021-02-12 DIAGNOSIS — J309 Allergic rhinitis, unspecified: Secondary | ICD-10-CM

## 2021-02-12 DIAGNOSIS — E782 Mixed hyperlipidemia: Secondary | ICD-10-CM

## 2021-02-12 DIAGNOSIS — I10 Essential (primary) hypertension: Secondary | ICD-10-CM | POA: Diagnosis present

## 2021-02-12 DIAGNOSIS — Z6841 Body Mass Index (BMI) 40.0 and over, adult: Secondary | ICD-10-CM

## 2021-02-12 DIAGNOSIS — F209 Schizophrenia, unspecified: Secondary | ICD-10-CM | POA: Diagnosis not present

## 2021-02-12 DIAGNOSIS — F325 Major depressive disorder, single episode, in full remission: Secondary | ICD-10-CM | POA: Diagnosis not present

## 2021-02-12 MED ORDER — ARIPIPRAZOLE 10 MG PO TABS: 10.0000 mg | ORAL_TABLET | Freq: Every day | ORAL | 0 refills | Status: DC

## 2021-02-12 MED ORDER — ALBUTEROL SULFATE HFA 108 (90 BASE) MCG/ACT IN AERS: INHALATION_SPRAY | RESPIRATORY_TRACT | 0 refills | Status: DC

## 2021-02-12 NOTE — Patient Instructions (Signed)
It was nice seeing you today! Thank you for choosing [facility] for your Primary Care.   I will call you with your lab results.   Let's follow up in 4-6 weeks regarding your depression.

## 2021-02-13 LAB — BMP8+ANION GAP
Anion Gap: 12 mmol/L (ref 10.0–18.0)
BUN/Creatinine Ratio: 22 (ref 9–23)
BUN: 20 mg/dL (ref 6–24)
CO2: 24 mmol/L (ref 20–29)
Calcium: 9.6 mg/dL (ref 8.7–10.2)
Chloride: 106 mmol/L (ref 96–106)
Creatinine, Ser: 0.9 mg/dL (ref 0.57–1.00)
Glucose: 94 mg/dL (ref 65–99)
Potassium: 3.8 mmol/L (ref 3.5–5.2)
Sodium: 142 mmol/L (ref 134–144)
eGFR: 79 mL/min/{1.73_m2} (ref >59)

## 2021-02-13 LAB — LIPID PANEL
Chol/HDL Ratio: 4.4 ratio (ref 0.0–4.4)
Cholesterol, Total: 212 mg/dL — ABNORMAL HIGH (ref 100–199)
HDL: 48 mg/dL (ref >39)
LDL Chol Calc (NIH): 153 mg/dL — ABNORMAL HIGH (ref 0–99)
Triglycerides: 64 mg/dL (ref 0–149)
VLDL Cholesterol Cal: 11 mg/dL (ref 5–40)

## 2021-02-14 DIAGNOSIS — E785 Hyperlipidemia, unspecified: Secondary | ICD-10-CM | POA: Insufficient documentation

## 2021-02-14 NOTE — Assessment & Plan Note (Addendum)
Ms. Lope states that she has been feeling as though her depression is worsening.  She notes increased irritability and anger.  She feels her depression is related to the recent loss of her best friend and feeling isolated as she does not have much family in this area as both her parents and all her sisters have passed away.  She does not have a good support system and does not feel she can talk to anybody about her concerns.  She requests a refill of her Abilify today.  She states that this has been used to treat her depression although in the past she was on a combination of Vistaril, Depakote, and Celexa and that seemed to have worked better for her. She states her medication has always been prescribed by [facility] clinic, however she follows up with [facility] as well.  [facility] is now only offering virtual therapy and she does not feel like this is helpful for her.  She does not see a psychiatrist at [facility].  Assessment/plan: [place] Office Visit from 02/12/2021 in [place]  PHQ-9 Total Score 6     - Ms. Burnsworth is interested in being referred to our psychologist here for counseling.  Referral placed.   - It is unlikely her Abilify is being used to treat her depression but more likely her schizophrenia.  See separate A&P for details.

## 2021-02-14 NOTE — Assessment & Plan Note (Addendum)
Michele Gillespie has a history of hyperlipidemia dating back to 2010, which is the last time her lipids were checked.  Lipid panel was obtained today and shows LDL of 153.  ASCVD risk score of 5.9%.    -No indication to start statin at this time but recommend she work on smoking cessation and lifestyle adjustments with weight loss. Attempted to contact patient to discuss this but unable to reach.

## 2021-02-14 NOTE — Assessment & Plan Note (Addendum)
Per chart review patient has approximately 90 pound weight gain over the last 6 months.  We will need to recheck weight to make sure this is accurate.  We did not have a chance to discuss this during her current visit but will need to be reassessed.  Consider obtaining TSH at next visit.  This may also be related to her Abilify use although this is a chronic medication for her.  -Address at next visit

## 2021-02-14 NOTE — Assessment & Plan Note (Addendum)
BP: 123/87   Blood pressure within goal at this time.  BMP assessed and no evidence of renal dysfunction.   -Continue amlodipine 5 mg daily

## 2021-02-14 NOTE — Assessment & Plan Note (Addendum)
Per chart review, patient has a history of schizophrenia for which Abilify was started around 2019. She has been following with [facility] but has not seen a psychiatrist there. She would benefit from psychiatry evaluation and medication management for her Schizophrenia.   - Referral to [facility] placed, please refer patient to psychiatry services for medication management.

## 2021-02-14 NOTE — Assessment & Plan Note (Signed)
Patient is requesting a refill of her albuterol today.  She denies any current shortness of breath or wheezing.  -Refilled albuterol

## 2021-02-16 NOTE — Progress Notes (Signed)
Internal Medicine Clinic Attending  Case discussed with Dr. Basaraba  At the time of the visit.  We reviewed the resident's history and exam and pertinent patient test results.  I agree with the assessment, diagnosis, and plan of care documented in the resident's note.  

## 2021-02-19 ENCOUNTER — Ambulatory Visit: Payer: Medicaid Other

## 2021-02-19 ENCOUNTER — Encounter: Payer: Self-pay | Admitting: Internal Medicine

## 2021-03-05 ENCOUNTER — Ambulatory Visit: Payer: Medicaid Other | Admitting: Pharmacist

## 2021-03-05 ENCOUNTER — Telehealth: Payer: Self-pay | Admitting: Behavioral Health

## 2021-03-05 ENCOUNTER — Other Ambulatory Visit: Payer: Self-pay

## 2021-03-05 ENCOUNTER — Ambulatory Visit: Payer: Medicaid Other | Admitting: Behavioral Health

## 2021-03-05 DIAGNOSIS — D6859 Other primary thrombophilia: Secondary | ICD-10-CM | POA: Diagnosis not present

## 2021-03-05 DIAGNOSIS — Z7901 Long term (current) use of anticoagulants: Secondary | ICD-10-CM | POA: Diagnosis present

## 2021-03-05 DIAGNOSIS — F419 Anxiety disorder, unspecified: Secondary | ICD-10-CM

## 2021-03-05 DIAGNOSIS — F209 Schizophrenia, unspecified: Secondary | ICD-10-CM

## 2021-03-05 DIAGNOSIS — F331 Major depressive disorder, recurrent, moderate: Secondary | ICD-10-CM

## 2021-03-05 LAB — POCT INR: INR: 2.8 (ref 2.0–3.0)

## 2021-03-05 NOTE — BH Specialist Note (Signed)
Integrated Behavioral Health via Telemedicine Visit  03/05/2021 Michele Gillespie [id number]  Number of [place] visits: 1/6 Session Start time: 16 Noon  Session End time: 12:30pm Total time: 30  Referring Provider: Dr. Jose Persia, MD Patient/Family location: Pt is home in private [facility] Provider location: [facility] Office All persons participating in visit: Pt & Clinician Types of Service: Health Promotion  I connected with Michele Gillespie and/or Hinton Rao Jewkes's self via  Telephone or Video Enabled Telemedicine Application  (Video is Caregility application) and verified that I am speaking with the correct person using two identifiers. Discussed confidentiality: Yes   I discussed the limitations of telemedicine and the availability of in person appointments.  Discussed there is a possibility of technology failure and discussed alternative modes of communication if that failure occurs.  I discussed that engaging in this telemedicine visit, they consent to the provision of behavioral healthcare and the services will be billed under their insurance.  Patient and/or legal guardian expressed understanding and consented to Telemedicine visit: Yes   Presenting Concerns: Patient and/or family reports the following symptoms/concerns: elevated dep, anger issues, Court Duration of problem: months to yrs; Severity of problem: moderate  Patient and/or Family's Strengths/Protective Factors: Concrete supports in place (healthy food, safe environments, etc.) and Physical Health (exercise, healthy diet, medication compliance, etc.)  Goals Addressed: Patient will: 1.  Reduce symptoms of: anxiety, depression and mood instability  2.  Increase knowledge and/or ability of: coping skills, healthy habits and stress reduction  3.  Demonstrate ability to: Increase healthy adjustment to current life circumstances and Increase motivation to adhere to plan of care  Progress towards  Goals: Estb'd today; Pt open to future sessions w/BH Clinician  Interventions: Interventions utilized:  Intro & Referral to [facility] for Eval/Assess Standardized Assessments completed: screeners prn  Patient and/or Family Response: Pt receptive to call & requests future visits. Will f/u on Referral to [facility].   Assessment: Patient currently experiencing inc in anx/dep & need for med mgmt for Schizophrenia.   Patient may benefit from f/u for Eval/Assess with [facility]ician for psychotherapy .  Plan: 1. Follow up with behavioral health clinician on : 2-3 wks on telehealth for 60 min 2. Behavioral recommendations: fllw through w/Eval & Assess Referral for med mgmt 3. Referral(s): Community Resources:  EvalAssess with [facility]  I discussed the assessment and treatment plan with the patient and/or parent/guardian. They were provided an opportunity to ask questions and all were answered. They agreed with the plan and demonstrated an understanding of the instructions.   They were advised to call back or seek an in-person evaluation if the symptoms worsen or if the condition fails to improve as anticipated.  Donnetta Hutching, LMFT
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
## 2021-03-05 NOTE — Patient Instructions (Signed)
Patient instructed to take medications as defined in the Anti-coagulation Track section of this encounter.  Patient instructed to take today's dose.  Patient instructed to take three (3) tablets of 5mg  on Sundays, Mondays, Wednesdays, and Fridays. Take two and one half (2.5) tablets of 5mg  on Tuesdays, Thursdays, and Saturdays.  Patient verbalized understanding of these instructions.

## 2021-03-05 NOTE — Telephone Encounter (Signed)
Contacted Pt on both phone this morning for 9:00am appt via telehealth. Lft msg on home phone both times & instructed Pt to call [facility] Office to r/s.   Dr. Viona Gilmore

## 2021-03-07 NOTE — Progress Notes (Signed)
Patient's management reviewed and I agree with assessment and plan.

## 2021-03-21 ENCOUNTER — Ambulatory Visit: Payer: Medicaid Other | Admitting: Behavioral Health

## 2021-03-21 DIAGNOSIS — F419 Anxiety disorder, unspecified: Secondary | ICD-10-CM

## 2021-03-21 DIAGNOSIS — F209 Schizophrenia, unspecified: Secondary | ICD-10-CM

## 2021-03-21 DIAGNOSIS — F331 Major depressive disorder, recurrent, moderate: Secondary | ICD-10-CM
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
## 2021-03-22 ENCOUNTER — Other Ambulatory Visit: Payer: Self-pay | Admitting: Gastroenterology

## 2021-03-22 DIAGNOSIS — K5900 Constipation, unspecified: Secondary | ICD-10-CM

## 2021-03-22 DIAGNOSIS — R1013 Epigastric pain: Secondary | ICD-10-CM

## 2021-03-22 DIAGNOSIS — R1310 Dysphagia, unspecified: Secondary | ICD-10-CM

## 2021-03-26 ENCOUNTER — Other Ambulatory Visit: Payer: Self-pay

## 2021-03-26 ENCOUNTER — Ambulatory Visit (INDEPENDENT_AMBULATORY_CARE_PROVIDER_SITE_OTHER): Payer: Medicaid Other | Admitting: Licensed Clinical Social Worker

## 2021-03-26 DIAGNOSIS — F3162 Bipolar disorder, current episode mixed, moderate: Secondary | ICD-10-CM
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
## 2021-03-29 ENCOUNTER — Encounter: Payer: Self-pay | Admitting: Neurology

## 2021-03-29 ENCOUNTER — Ambulatory Visit: Payer: Medicaid Other | Admitting: Neurology
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
## 2021-03-30 ENCOUNTER — Other Ambulatory Visit: Payer: Self-pay | Admitting: Pharmacist

## 2021-03-30 DIAGNOSIS — D6859 Other primary thrombophilia: Secondary | ICD-10-CM

## 2021-03-30 DIAGNOSIS — Z7901 Long term (current) use of anticoagulants: Secondary | ICD-10-CM

## 2021-03-30 MED ORDER — WARFARIN SODIUM 5 MG PO TABS: ORAL_TABLET | ORAL | 1 refills | Status: DC

## 2021-03-30 NOTE — Telephone Encounter (Signed)
Requests refill on warfarin at 6:10pm on Friday, 1-JUL-22. Will refill electronically from home.

## 2021-04-02 ENCOUNTER — Other Ambulatory Visit: Payer: Self-pay

## 2021-04-02 ENCOUNTER — Emergency Department (HOSPITAL_COMMUNITY)
Admission: EM | Admit: 2021-04-02 | Discharge: 2021-04-02 | Disposition: A | Discharge status: Home / Self Care | Payer: Medicaid Other | Attending: Emergency Medicine | Admitting: Emergency Medicine

## 2021-04-02 DIAGNOSIS — Y92830 Public park as the place of occurrence of the external cause: Secondary | ICD-10-CM | POA: Insufficient documentation

## 2021-04-02 DIAGNOSIS — J45909 Unspecified asthma, uncomplicated: Secondary | ICD-10-CM | POA: Insufficient documentation

## 2021-04-02 DIAGNOSIS — W57XXXA Bitten or stung by nonvenomous insect and other nonvenomous arthropods, initial encounter: Secondary | ICD-10-CM | POA: Insufficient documentation

## 2021-04-02 DIAGNOSIS — I10 Essential (primary) hypertension: Secondary | ICD-10-CM | POA: Diagnosis not present

## 2021-04-02 DIAGNOSIS — Z7901 Long term (current) use of anticoagulants: Secondary | ICD-10-CM | POA: Diagnosis not present

## 2021-04-02 DIAGNOSIS — F17210 Nicotine dependence, cigarettes, uncomplicated: Secondary | ICD-10-CM | POA: Diagnosis not present

## 2021-04-02 DIAGNOSIS — S70362A Insect bite (nonvenomous), left thigh, initial encounter: Secondary | ICD-10-CM | POA: Insufficient documentation

## 2021-04-02 DIAGNOSIS — Z79899 Other long term (current) drug therapy: Secondary | ICD-10-CM | POA: Diagnosis not present

## 2021-04-02 MED ORDER — HYDROCORTISONE 1 % EX OINT: 1.0000 "application " | TOPICAL_OINTMENT | Freq: Two times a day (BID) | CUTANEOUS | 0 refills | Status: DC
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
## 2021-04-02 NOTE — ED Triage Notes (Signed)
Pt c/o multiple insect bites after being outside yesterday- on L thigh, lower abdomen, L chest, "itching like crazy." Pt states no medication taken for fear of counteracting w AC.

## 2021-04-02 NOTE — Discharge Instructions (Addendum)
1. Medications: hydrocortisone ointment on the sites that itch, benadryl for additional itching, usual home medications 2. Treatment: rest, drink plenty of fluids,  3. Follow Up: Please followup with your primary doctor in 1-2 days for discussion of your diagnoses and further evaluation after today's visit; if you do not have a primary care doctor use the resource guide provided to find one; Please return to the ER for fevers, worsening rash, open wounds, infection or other concerns

## 2021-04-03 ENCOUNTER — Encounter: Payer: Self-pay | Admitting: *Deleted

## 2021-04-04 ENCOUNTER — Telehealth: Payer: Self-pay | Admitting: Neurology

## 2021-04-04 NOTE — Telephone Encounter (Signed)
Patient came in to reschedule from 6/30 no show. She needs visit before she can get her sleep equipment. Scheduled with Sarah for 8/2 but what other provider/NP can she see in her absence if she is out 8/2. I also have the patient on a waitlist for Sarah. If you advise who she can see I can try to find her an earlier appt. Thank you

## 2021-04-05 NOTE — Telephone Encounter (Signed)
Resched w/ Dr. Rexene Alberts

## 2021-04-09 ENCOUNTER — Ambulatory Visit: Payer: Medicaid Other

## 2021-04-12 ENCOUNTER — Telehealth: Payer: Self-pay | Admitting: Behavioral Health

## 2021-04-12 ENCOUNTER — Ambulatory Visit: Payer: Medicaid Other | Admitting: Behavioral Health

## 2021-04-12 NOTE — Telephone Encounter (Signed)
Contacted Pt for 2:00pm session via telehealth. Lft 4 msg encouraging Pt to [facility] to [facility] for f/u of appt w/[facility] on 03/19/21. Suggested Pt update Clinician @ her earliest convenience.   Dr. Theodis Shove

## 2021-04-27 ENCOUNTER — Telehealth: Payer: Self-pay | Admitting: Neurology

## 2021-04-27 NOTE — Telephone Encounter (Signed)
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  Pt is asking for a call re: what she is to do about not having any CPAP supplies, she states she has not used her CPAP in 2 weeks, please call to discuss at 3pm

## 2021-04-30 NOTE — Telephone Encounter (Addendum)
I called pt and LMVM and relayed that last seen 11-2019 with Dr. Jannifer Franklin.  Multiple cancellations and reschedules due to work issues.  I know that last seen 2020 for cpap, would need appt for order for cpap supplies.  Please call back.

## 2021-05-01 ENCOUNTER — Ambulatory Visit: Payer: Medicaid Other | Admitting: Neurology

## 2021-05-08 ENCOUNTER — Ambulatory Visit (HOSPITAL_COMMUNITY): Payer: Medicaid Other | Admitting: Licensed Clinical Social Worker

## 2021-05-08 ENCOUNTER — Other Ambulatory Visit: Payer: Self-pay

## 2021-05-09 ENCOUNTER — Telehealth (HOSPITAL_COMMUNITY): Payer: Self-pay | Admitting: Licensed Clinical Social Worker

## 2021-05-09 NOTE — Telephone Encounter (Signed)
LCSW sent two links to pt phone with no response. LCSW f/u with PC and pt did not answer. HIPAA compliant VM left.

## 2021-05-14 ENCOUNTER — Telehealth: Payer: Self-pay | Admitting: Behavioral Health

## 2021-05-14 ENCOUNTER — Institutional Professional Consult (permissible substitution): Payer: Medicaid Other | Admitting: Behavioral Health

## 2021-05-14 NOTE — Telephone Encounter (Signed)
Lft msg on both home & mobile lines today for 9:00am appt. Instructed Pt to [facility] to [facility] & r/s @ her convenience.  Dr. Theodis Shove

## 2021-05-21 ENCOUNTER — Other Ambulatory Visit: Payer: Self-pay

## 2021-05-21 ENCOUNTER — Ambulatory Visit (INDEPENDENT_AMBULATORY_CARE_PROVIDER_SITE_OTHER): Payer: Self-pay | Admitting: Pharmacist

## 2021-05-21 ENCOUNTER — Encounter (HOSPITAL_COMMUNITY): Payer: Self-pay

## 2021-05-21 ENCOUNTER — Ambulatory Visit (INDEPENDENT_AMBULATORY_CARE_PROVIDER_SITE_OTHER): Payer: Medicaid Other | Admitting: Psychiatry

## 2021-05-21 DIAGNOSIS — F3132 Bipolar disorder, current episode depressed, moderate: Secondary | ICD-10-CM

## 2021-05-21 DIAGNOSIS — F325 Major depressive disorder, single episode, in full remission: Secondary | ICD-10-CM | POA: Diagnosis not present

## 2021-05-21 DIAGNOSIS — F209 Schizophrenia, unspecified: Secondary | ICD-10-CM | POA: Diagnosis not present

## 2021-05-21 DIAGNOSIS — D6859 Other primary thrombophilia: Secondary | ICD-10-CM

## 2021-05-21 DIAGNOSIS — Z7901 Long term (current) use of anticoagulants: Secondary | ICD-10-CM

## 2021-05-21 LAB — POCT INR: INR: 2 (ref 2.0–3.0)

## 2021-05-21 MED ORDER — ARIPIPRAZOLE 10 MG PO TABS
10.0000 mg | ORAL_TABLET | Freq: Every day | ORAL | 2 refills | Status: DC
Start: 2021-05-21 — End: 2024-05-24

## 2021-05-21 MED ORDER — DIVALPROEX SODIUM 500 MG PO DR TAB: 500.0000 mg | DELAYED_RELEASE_TABLET | Freq: Two times a day (BID) | ORAL | 2 refills | Status: DC

## 2021-05-21 MED ORDER — CITALOPRAM HYDROBROMIDE 10 MG PO TABS: 10.0000 mg | ORAL_TABLET | Freq: Every day | ORAL | 2 refills | Status: DC
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
## 2021-05-21 NOTE — Patient Instructions (Signed)
Patient instructed to take medications as defined in the Anti-coagulation Track section of this encounter.  Patient instructed to take today's dose.  Patient instructed to take three (3) of your '5mg'$  peach-colored warfarin tablets once-daily at 6PM--EXCEPT ON SATURDAYS, take only 2 & 1/2 tablets on Saturdays.  Patient verbalized understanding of these instructions.
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
## 2021-05-29 NOTE — Progress Notes (Signed)
INTERNAL MEDICINE TEACHING ATTENDING ADDENDUM   I agree with pharmacy recommendations as outlined in their note.   Zayed Griffie, MD  

## 2021-06-05 ENCOUNTER — Ambulatory Visit (HOSPITAL_COMMUNITY): Payer: Self-pay | Admitting: Licensed Clinical Social Worker

## 2021-06-11 ENCOUNTER — Other Ambulatory Visit: Payer: Self-pay | Admitting: Gastroenterology

## 2021-06-11 ENCOUNTER — Other Ambulatory Visit: Payer: Self-pay | Admitting: Pharmacist

## 2021-06-11 DIAGNOSIS — Z7901 Long term (current) use of anticoagulants: Secondary | ICD-10-CM

## 2021-06-11 DIAGNOSIS — D6859 Other primary thrombophilia: Secondary | ICD-10-CM

## 2021-06-11 DIAGNOSIS — K259 Gastric ulcer, unspecified as acute or chronic, without hemorrhage or perforation: Secondary | ICD-10-CM

## 2021-06-11 DIAGNOSIS — K2980 Duodenitis without bleeding: Secondary | ICD-10-CM

## 2021-06-11 DIAGNOSIS — K2970 Gastritis, unspecified, without bleeding: Secondary | ICD-10-CM

## 2021-06-11 MED ORDER — WARFARIN SODIUM 5 MG PO TABS: ORAL_TABLET | ORAL | 3 refills | Status: DC

## 2021-06-11 NOTE — Telephone Encounter (Signed)
Requests refill of her warfarin. Current regimen:  3x'5mg'$  Sunday-Friday; 2 and 1/2 x '5mg'$  on Saturday.

## 2021-06-12 ENCOUNTER — Other Ambulatory Visit: Payer: Self-pay | Admitting: Gastroenterology

## 2021-06-12 DIAGNOSIS — K2970 Gastritis, unspecified, without bleeding: Secondary | ICD-10-CM

## 2021-06-12 DIAGNOSIS — K2980 Duodenitis without bleeding: Secondary | ICD-10-CM

## 2021-06-12 DIAGNOSIS — K259 Gastric ulcer, unspecified as acute or chronic, without hemorrhage or perforation: Secondary | ICD-10-CM

## 2021-06-19 ENCOUNTER — Other Ambulatory Visit: Payer: Self-pay

## 2021-06-19 ENCOUNTER — Ambulatory Visit: Payer: Medicaid Other | Admitting: Neurology

## 2021-06-19 ENCOUNTER — Encounter: Payer: Self-pay | Admitting: Neurology

## 2021-06-19 VITALS — BP 115/76 | HR 78 | Ht 64.0 in | Wt 225.4 lb

## 2021-06-19 DIAGNOSIS — Z9989 Dependence on other enabling machines and devices: Secondary | ICD-10-CM

## 2021-06-19 DIAGNOSIS — G4733 Obstructive sleep apnea (adult) (pediatric): Secondary | ICD-10-CM

## 2021-06-19 NOTE — Patient Instructions (Signed)
Please continue using your autoPAP regularly. While your insurance requires that you use PAP at least 4 hours each night on 70% of the nights, I recommend, that you not skip any nights and use it throughout the night if you can. Getting used to PAP and staying with the treatment long term does take time and patience and discipline. Untreated obstructive sleep apnea when it is moderate to severe can have an adverse impact on cardiovascular health and raise her risk for heart disease, arrhythmias, hypertension, congestive heart failure, stroke and diabetes. Untreated obstructive sleep apnea causes sleep disruption, nonrestorative sleep, and sleep deprivation. This can have an impact on your day to day functioning and cause daytime sleepiness and impairment of cognitive function, memory loss, mood disturbance, and problems focussing. Using PAP regularly can improve these symptoms. You can follow-up to see one of our nurse practitioners in 6 months, and if you are doing well on your AutoPap at the time, we can see you yearly in sleep clinic after that.
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
## 2021-06-25 ENCOUNTER — Other Ambulatory Visit: Payer: Self-pay | Admitting: Gastroenterology

## 2021-06-25 ENCOUNTER — Telehealth: Payer: Self-pay

## 2021-06-25 DIAGNOSIS — K259 Gastric ulcer, unspecified as acute or chronic, without hemorrhage or perforation: Secondary | ICD-10-CM

## 2021-06-25 DIAGNOSIS — K2970 Gastritis, unspecified, without bleeding: Secondary | ICD-10-CM

## 2021-06-25 DIAGNOSIS — K2980 Duodenitis without bleeding: Secondary | ICD-10-CM

## 2021-06-25 NOTE — Telephone Encounter (Signed)
omeprazole (PRILOSEC) 40 MG capsule, refill request @ [id number] - [place], [place].

## 2021-06-25 NOTE — Telephone Encounter (Signed)
Omeprazole was refilled today ; pt was called and informed.

## 2021-07-02 ENCOUNTER — Ambulatory Visit: Payer: Medicaid Other

## 2021-07-10 ENCOUNTER — Other Ambulatory Visit: Payer: Self-pay

## 2021-07-10 ENCOUNTER — Emergency Department (HOSPITAL_COMMUNITY): Payer: Medicaid Other

## 2021-07-10 ENCOUNTER — Encounter (HOSPITAL_COMMUNITY): Payer: Self-pay | Admitting: Emergency Medicine

## 2021-07-10 ENCOUNTER — Emergency Department (HOSPITAL_COMMUNITY)
Admission: EM | Admit: 2021-07-10 | Discharge: 2021-07-11 | Disposition: A | Discharge status: Home / Self Care | Payer: Medicaid Other | Attending: Emergency Medicine | Admitting: Emergency Medicine

## 2021-07-10 DIAGNOSIS — M25512 Pain in left shoulder: Secondary | ICD-10-CM | POA: Diagnosis not present

## 2021-07-10 DIAGNOSIS — F17210 Nicotine dependence, cigarettes, uncomplicated: Secondary | ICD-10-CM | POA: Insufficient documentation

## 2021-07-10 DIAGNOSIS — Y92410 Unspecified street and highway as the place of occurrence of the external cause: Secondary | ICD-10-CM | POA: Diagnosis not present

## 2021-07-10 DIAGNOSIS — Z79899 Other long term (current) drug therapy: Secondary | ICD-10-CM | POA: Insufficient documentation

## 2021-07-10 DIAGNOSIS — J45909 Unspecified asthma, uncomplicated: Secondary | ICD-10-CM | POA: Insufficient documentation

## 2021-07-10 DIAGNOSIS — R52 Pain, unspecified: Secondary | ICD-10-CM

## 2021-07-10 DIAGNOSIS — M545 Low back pain, unspecified: Secondary | ICD-10-CM | POA: Diagnosis not present

## 2021-07-10 DIAGNOSIS — S0990XA Unspecified injury of head, initial encounter: Secondary | ICD-10-CM | POA: Diagnosis present

## 2021-07-10 DIAGNOSIS — I10 Essential (primary) hypertension: Secondary | ICD-10-CM | POA: Insufficient documentation

## 2021-07-10 DIAGNOSIS — R42 Dizziness and giddiness: Secondary | ICD-10-CM | POA: Diagnosis not present

## 2021-07-10 DIAGNOSIS — S0083XA Contusion of other part of head, initial encounter: Secondary | ICD-10-CM | POA: Diagnosis not present

## 2021-07-10 DIAGNOSIS — Z7901 Long term (current) use of anticoagulants: Secondary | ICD-10-CM | POA: Diagnosis not present
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
## 2021-07-11 ENCOUNTER — Emergency Department (HOSPITAL_COMMUNITY): Payer: Medicaid Other

## 2021-07-11 MED ORDER — METHOCARBAMOL 500 MG PO TABS: 500.0000 mg | ORAL_TABLET | Freq: Two times a day (BID) | ORAL | 0 refills | Status: DC
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
## 2021-07-11 NOTE — ED Notes (Signed)
Pt discharged and wheeled out of the ED without difficulty. 
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
## 2021-07-16 ENCOUNTER — Telehealth (HOSPITAL_COMMUNITY): Payer: Medicaid Other | Admitting: Psychiatry

## 2021-07-16 ENCOUNTER — Other Ambulatory Visit: Payer: Self-pay

## 2021-07-23 ENCOUNTER — Ambulatory Visit: Payer: Medicaid Other | Admitting: Pharmacist

## 2021-07-23 DIAGNOSIS — Z7901 Long term (current) use of anticoagulants: Secondary | ICD-10-CM

## 2021-07-23 DIAGNOSIS — D6859 Other primary thrombophilia: Secondary | ICD-10-CM

## 2021-07-23 LAB — POCT INR: INR: 2.4 (ref 2.0–3.0)
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
## 2021-07-23 NOTE — Patient Instructions (Signed)
Patient instructed to take medications as defined in the Anti-coagulation Track section of this encounter.  Patient instructed to take today's dose.  Patient instructed to take three (3) tablets of your 5mg  peach-colored warfarin tablets by mouth, once-daily at [facility]. Patient verbalized understanding of these instructions.

## 2021-07-24 ENCOUNTER — Other Ambulatory Visit: Payer: Self-pay | Admitting: Internal Medicine

## 2021-07-24 DIAGNOSIS — Z1231 Encounter for screening mammogram for malignant neoplasm of breast: Secondary | ICD-10-CM

## 2021-08-01 NOTE — Progress Notes (Signed)
INTERNAL MEDICINE TEACHING ATTENDING ADDENDUM   I agree with pharmacy recommendations as outlined in their note.   Florice Hindle, MD  

## 2021-08-06 ENCOUNTER — Emergency Department (HOSPITAL_COMMUNITY): Payer: Medicaid Other

## 2021-08-06 ENCOUNTER — Encounter (HOSPITAL_COMMUNITY): Payer: Self-pay | Admitting: Emergency Medicine

## 2021-08-06 ENCOUNTER — Emergency Department (HOSPITAL_COMMUNITY)
Admission: EM | Admit: 2021-08-06 | Discharge: 2021-08-06 | Disposition: A | Discharge status: Home / Self Care | Payer: Medicaid Other | Attending: Student | Admitting: Student

## 2021-08-06 ENCOUNTER — Other Ambulatory Visit: Payer: Self-pay

## 2021-08-06 DIAGNOSIS — J45909 Unspecified asthma, uncomplicated: Secondary | ICD-10-CM | POA: Insufficient documentation

## 2021-08-06 DIAGNOSIS — R1084 Generalized abdominal pain: Secondary | ICD-10-CM | POA: Diagnosis present

## 2021-08-06 DIAGNOSIS — Z7901 Long term (current) use of anticoagulants: Secondary | ICD-10-CM | POA: Insufficient documentation

## 2021-08-06 DIAGNOSIS — I10 Essential (primary) hypertension: Secondary | ICD-10-CM | POA: Insufficient documentation

## 2021-08-06 DIAGNOSIS — D72819 Decreased white blood cell count, unspecified: Secondary | ICD-10-CM | POA: Diagnosis not present

## 2021-08-06 DIAGNOSIS — N760 Acute vaginitis: Secondary | ICD-10-CM | POA: Insufficient documentation

## 2021-08-06 DIAGNOSIS — F17210 Nicotine dependence, cigarettes, uncomplicated: Secondary | ICD-10-CM | POA: Insufficient documentation

## 2021-08-06 DIAGNOSIS — B9689 Other specified bacterial agents as the cause of diseases classified elsewhere: Secondary | ICD-10-CM | POA: Diagnosis not present

## 2021-08-06 DIAGNOSIS — Z79899 Other long term (current) drug therapy: Secondary | ICD-10-CM | POA: Diagnosis not present

## 2021-08-06 LAB — BASIC METABOLIC PANEL
Anion gap: 6 (ref 5–15)
BUN: 13 mg/dL (ref 6–20)
CO2: 23 mmol/L (ref 22–32)
Calcium: 9 mg/dL (ref 8.9–10.3)
Chloride: 110 mmol/L (ref 98–111)
Creatinine, Ser: 1.13 mg/dL — ABNORMAL HIGH (ref 0.44–1.00)
GFR, Estimated: >60 mL/min (ref >60)
Glucose, Bld: 106 mg/dL — ABNORMAL HIGH (ref 70–99)
Potassium: 4 mmol/L (ref 3.5–5.1)
Sodium: 139 mmol/L (ref 135–145)

## 2021-08-06 LAB — CBC WITH DIFFERENTIAL/PLATELET
Abs Immature Granulocytes: 0.01 10*3/uL (ref 0.00–0.07)
Basophils Absolute: 0 10*3/uL (ref 0.0–0.1)
Basophils Relative: 1 %
Eosinophils Absolute: 0.1 10*3/uL (ref 0.0–0.5)
Eosinophils Relative: 2 %
HCT: 42 % (ref 36.0–46.0)
Hemoglobin: 13.6 g/dL (ref 12.0–15.0)
Immature Granulocytes: 0 %
Lymphocytes Relative: 55 %
Lymphs Abs: 2.2 10*3/uL (ref 0.7–4.0)
MCH: 25.2 pg — ABNORMAL LOW (ref 26.0–34.0)
MCHC: 32.4 g/dL (ref 30.0–36.0)
MCV: 77.9 fL — ABNORMAL LOW (ref 80.0–100.0)
Monocytes Absolute: 0.3 10*3/uL (ref 0.1–1.0)
Monocytes Relative: 6 %
Neutro Abs: 1.4 10*3/uL — ABNORMAL LOW (ref 1.7–7.7)
Neutrophils Relative %: 36 %
Platelets: 196 10*3/uL (ref 150–400)
RBC: 5.39 MIL/uL — ABNORMAL HIGH (ref 3.87–5.11)
RDW: 14.5 % (ref 11.5–15.5)
WBC: 3.9 10*3/uL — ABNORMAL LOW (ref 4.0–10.5)
nRBC: 0 % (ref 0.0–0.2)

## 2021-08-06 LAB — WET PREP, GENITAL
Sperm: NONE SEEN
Trich, Wet Prep: NONE SEEN
Yeast Wet Prep HPF POC: NONE SEEN

## 2021-08-06 LAB — URINALYSIS, ROUTINE W REFLEX MICROSCOPIC
Bilirubin Urine: NEGATIVE
Glucose, UA: NEGATIVE mg/dL
Hgb urine dipstick: NEGATIVE
Ketones, ur: NEGATIVE mg/dL
Leukocytes,Ua: NEGATIVE
Nitrite: NEGATIVE
Protein, ur: NEGATIVE mg/dL
Specific Gravity, Urine: 1.017 (ref 1.005–1.030)
pH: 8 (ref 5.0–8.0)

## 2021-08-06 LAB — RAPID HIV SCREEN (HIV 1/2 AB+AG)
HIV 1/2 Antibodies: NONREACTIVE
HIV-1 P24 Antigen - HIV24: NONREACTIVE

## 2021-08-06 MED ORDER — METRONIDAZOLE 500 MG PO TABS: 500.0000 mg | ORAL_TABLET | Freq: Two times a day (BID) | ORAL | 0 refills | Status: DC

## 2021-08-06 MED ORDER — IOHEXOL 300 MG/ML  SOLN
100.0000 mL | Freq: Once | INTRAMUSCULAR | Status: AC | PRN
  Administered 2021-08-06: 100 mL via INTRAVENOUS
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
## 2021-08-06 NOTE — Discharge Instructions (Addendum)
Your chlamydia and gonorrhea testing will not come back today.  Your trichomonas is negative.  You do have bacterial vaginosis, I have sent an antibiotic name metronidazole to your pharmacy.  Please take this as prescribed.  It is important that you do not drink alcohol on this antibiotic.  Your abdominal CT looks good as well.  You may look for the remainder of your test results in your chart.  It was a pleasure to meet you today and I hope that you feel better.

## 2021-08-07 LAB — GC/CHLAMYDIA PROBE AMP (~~LOC~~) NOT AT ARMC
Chlamydia: NEGATIVE
Comment: NEGATIVE
Comment: NORMAL
Neisseria Gonorrhea: NEGATIVE

## 2021-08-27 ENCOUNTER — Ambulatory Visit: Payer: Medicaid Other

## 2021-08-28 ENCOUNTER — Ambulatory Visit
Admission: RE | Admit: 2021-08-28 | Discharge: 2021-08-28 | Disposition: A | Discharge status: Home / Self Care | Payer: Medicaid Other | Source: Ambulatory Visit | Attending: Internal Medicine | Admitting: Internal Medicine

## 2021-08-28 ENCOUNTER — Other Ambulatory Visit: Payer: Self-pay

## 2021-08-28 DIAGNOSIS — Z1231 Encounter for screening mammogram for malignant neoplasm of breast: Secondary | ICD-10-CM

## 2021-08-30 ENCOUNTER — Telehealth: Payer: Self-pay | Admitting: Neurology

## 2021-08-30 NOTE — Telephone Encounter (Addendum)
I sent CM to [facility] concerning order.  I tried to call pt but could not LVM.

## 2021-08-30 NOTE — Telephone Encounter (Signed)
Pt called states DME has not received order for new CPAP supplies. Pt requesting a call back.

## 2021-09-03 NOTE — Telephone Encounter (Signed)
Denyse Amass, RN got it!      Previous Messages   ----- Message -----  From: Brandon Melnick, RN  Sent: 09/03/2021  12:36 PM EST  To: Marchelle Gearing   Check on the [facility] for this pt Michele Gillespie  Female, 45 y.o., 1975/10/23  MRN:  [id number]  Phone:  [phone]  If this is on goscripts we just did this.  I was not aware.  Sorry if this caused pt delay.     Acupuncturist

## 2021-09-03 NOTE — Telephone Encounter (Signed)
I called pt and DMA needed.  (Any authorization is done by DME I told her).  She has been trying to get supplies for 6 months.  [facility] was done by Romelle Starcher this am.   I Roxanne Mins at [employer] and hopefully is will go thru for pt.  Will let her know once I hear back from Ashley/[facility]).

## 2021-09-03 NOTE — Telephone Encounter (Signed)
Denyse Amass, RN got it!      Previous Messages   ----- Message -----  From: Brandon Melnick, RN  Sent: 08/30/2021   2:16 PM EST  To: Ocie Bob, *  Subject: cpap supplies, order from 06-01-21               Order in [place] for this pt, back from 06-19-21

## 2021-09-03 NOTE — Telephone Encounter (Signed)
Pt called back stating that the DME states they need an authorization not an order.

## 2021-09-10 ENCOUNTER — Ambulatory Visit: Payer: Medicaid Other | Admitting: Pharmacist

## 2021-09-10 DIAGNOSIS — D6859 Other primary thrombophilia: Secondary | ICD-10-CM | POA: Diagnosis present

## 2021-09-10 DIAGNOSIS — Z7901 Long term (current) use of anticoagulants: Secondary | ICD-10-CM | POA: Diagnosis not present

## 2021-09-10 LAB — POCT INR: INR: 3.1 — AB (ref 2.0–3.0)

## 2021-09-10 NOTE — Patient Instructions (Signed)
Patient instructed to take medications as defined in the Anti-coagulation Track section of this encounter.  Patient instructed to take today's dose.  Patient instructed to take three (3) tablets of 5mg  once-daily at 6:00 pm.  Patient verbalized understanding of these instructions.
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
## 2021-09-12 NOTE — Progress Notes (Signed)
INTERNAL MEDICINE TEACHING ATTENDING ADDENDUM - Brianca Fortenberry M.D  Duration- indefinite, Indication- recurrent VTE, INR- supratherapeutic. Agree with pharmacy recommendations as outlined in their note.     

## 2021-09-17 ENCOUNTER — Other Ambulatory Visit: Payer: Self-pay | Admitting: Pharmacist

## 2021-09-17 DIAGNOSIS — Z7901 Long term (current) use of anticoagulants: Secondary | ICD-10-CM

## 2021-09-17 DIAGNOSIS — D6859 Other primary thrombophilia: Secondary | ICD-10-CM

## 2021-09-17 MED ORDER — WARFARIN SODIUM 7.5 MG PO TABS: 7.5000 mg | ORAL_TABLET | Freq: Every day | ORAL | 2 refills | Status: DC

## 2021-09-17 NOTE — Telephone Encounter (Signed)
Refill requested on warfarin. Sent Rx to her pharmacy for 3x5mg  (15mg ) tablets by mouth orally, once-daily at 4PM. 2 Refills.

## 2021-09-26 ENCOUNTER — Encounter: Payer: Self-pay | Admitting: Internal Medicine

## 2021-09-26 ENCOUNTER — Ambulatory Visit: Payer: Medicaid Other | Admitting: Internal Medicine

## 2021-09-26 ENCOUNTER — Other Ambulatory Visit: Payer: Self-pay

## 2021-09-26 VITALS — BP 122/87 | HR 82 | Temp 98.0°F | Ht 64.5 in | Wt 228.5 lb

## 2021-09-26 DIAGNOSIS — M351 Other overlap syndromes: Secondary | ICD-10-CM | POA: Diagnosis not present

## 2021-09-26 DIAGNOSIS — F3132 Bipolar disorder, current episode depressed, moderate: Secondary | ICD-10-CM | POA: Diagnosis not present

## 2021-09-26 DIAGNOSIS — I10 Essential (primary) hypertension: Secondary | ICD-10-CM | POA: Diagnosis not present

## 2021-09-26 DIAGNOSIS — N179 Acute kidney failure, unspecified: Secondary | ICD-10-CM | POA: Insufficient documentation

## 2021-09-26 DIAGNOSIS — Z23 Encounter for immunization: Secondary | ICD-10-CM | POA: Diagnosis not present

## 2021-09-26 DIAGNOSIS — K581 Irritable bowel syndrome with constipation: Secondary | ICD-10-CM

## 2021-09-26 DIAGNOSIS — K219 Gastro-esophageal reflux disease without esophagitis: Secondary | ICD-10-CM | POA: Diagnosis not present

## 2021-09-26 MED ORDER — AMLODIPINE BESYLATE 5 MG PO TABS: 5.0000 mg | ORAL_TABLET | Freq: Every day | ORAL | 11 refills | Status: DC

## 2021-09-26 MED ORDER — LINACLOTIDE 72 MCG PO CAPS: 72.0000 ug | ORAL_CAPSULE | Freq: Every day | ORAL | 2 refills | Status: DC

## 2021-09-26 MED ORDER — CITALOPRAM HYDROBROMIDE 10 MG PO TABS: 10.0000 mg | ORAL_TABLET | Freq: Every day | ORAL | 2 refills | Status: DC

## 2021-09-26 MED ORDER — DIVALPROEX SODIUM 500 MG PO DR TAB
500.0000 mg | DELAYED_RELEASE_TABLET | Freq: Two times a day (BID) | ORAL | 2 refills | Status: DC
Start: 2021-09-26 — End: 2024-04-20

## 2021-09-26 NOTE — Assessment & Plan Note (Signed)
Recent urgent care visit with evidence of elevated creatinine of 1.3.  Patient has had intermittently elevated creatinine in the past but generally has her BMP checked when feeling ill.  We will recheck today.  Of note, rheumatology checked UPCR, which was unremarkable.  - BMP ordered and pending

## 2021-09-26 NOTE — Assessment & Plan Note (Signed)
Ms. Stanke states that GI started her on Linzess.  This controls her constipation predominant IBS well.  She is out of her medication though requesting a refill.  Assessment/plan: - Refilled Linzess

## 2021-09-26 NOTE — Assessment & Plan Note (Signed)
Michele Gillespie states that she continues to have heartburn at times but understands that this may be due to her diet.  She states that she eats lots of tomato based products, such as spaghetti and pizza.  She knows that this can worsen her GERD.  Assessment/plan: EGD in March 2022 with evidence of chronic gastritis. No other significant findings, including negative H. Pylori.   - Continue Omeprazole BID - Counseled regarding dietary changes - Recommended Pepcid PRN for breakthrough heartburn when eating acidic foods.

## 2021-09-26 NOTE — Assessment & Plan Note (Signed)
Ms. Winborne is currently following with [facility].  She is requesting a refill today of her Depakote and Celexa.  She also continues to take Abilify daily.  She feels like this is controlling her symptoms well.  Assessment/plan: - Refilled Celexa and Depakote - Continue taking Abilify daily - Continue to follow with [facility] and [facility]

## 2021-09-26 NOTE — Assessment & Plan Note (Addendum)
Ms. Gitto followed with Dr. 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  At this time, she is not experiencing any symptoms.  Counseled that she should follow-up with Dr. Benjamine Mola.  - Continue following with rheumatology - Counseled to monitor for any changes in joints or skin

## 2021-09-26 NOTE — Assessment & Plan Note (Signed)
Ms. Hafer reports compliance with Amlodipine daily. No concerns regarding her medications today.   Assessment/Plan:  Blood pressure well controlled at this time.   - Continue Amlodipine 5 mg daily

## 2021-09-26 NOTE — Patient Instructions (Addendum)
It was nice seeing you today! Thank you for choosing [facility] for your Primary Care.    Today we talked about:   I have refilled your medications today: Amlodipine, Linzess, Depakote, Celexa Your blood pressure today is great. Continue to take daily Amlodipine You can take Pepcid when your heart burn is acting up. I do recommend avoiding tomato based foods though.  Follow up with Dr. Benjamine Mola, your rheumatologist.  We are rechecking your kidney function today.
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
## 2021-09-27 LAB — BMP8+ANION GAP
Anion Gap: 15 mmol/L (ref 10.0–18.0)
BUN/Creatinine Ratio: 16 (ref 9–23)
BUN: 16 mg/dL (ref 6–24)
CO2: 21 mmol/L (ref 20–29)
Calcium: 9.3 mg/dL (ref 8.7–10.2)
Chloride: 104 mmol/L (ref 96–106)
Creatinine, Ser: 1.02 mg/dL — ABNORMAL HIGH (ref 0.57–1.00)
Glucose: 95 mg/dL (ref 70–99)
Potassium: 4 mmol/L (ref 3.5–5.2)
Sodium: 140 mmol/L (ref 134–144)
eGFR: 68 mL/min/{1.73_m2} (ref >59)

## 2021-10-01 NOTE — Progress Notes (Signed)
Internal Medicine Clinic Attending  Case discussed with Dr. Basaraba at the time of the visit.  We reviewed the resident's history and exam and pertinent patient test results.  I agree with the assessment, diagnosis, and plan of care documented in the resident's note.    

## 2021-10-12 NOTE — Progress Notes (Signed)
Improvement in renal function noted.

## 2021-10-22 ENCOUNTER — Ambulatory Visit: Payer: Medicaid Other

## 2021-11-09 ENCOUNTER — Other Ambulatory Visit (INDEPENDENT_AMBULATORY_CARE_PROVIDER_SITE_OTHER): Payer: Medicaid Other | Admitting: Pharmacist

## 2021-11-09 ENCOUNTER — Other Ambulatory Visit: Payer: Self-pay | Admitting: Pharmacist

## 2021-11-09 DIAGNOSIS — D6859 Other primary thrombophilia: Secondary | ICD-10-CM

## 2021-11-09 DIAGNOSIS — Z7901 Long term (current) use of anticoagulants: Secondary | ICD-10-CM | POA: Diagnosis not present

## 2021-11-09 MED ORDER — WARFARIN SODIUM 5 MG PO TABS: 15.0000 mg | ORAL_TABLET | Freq: Every day | ORAL | 0 refills | Status: DC

## 2021-11-09 MED ORDER — WARFARIN SODIUM 7.5 MG PO TABS: 15.0000 mg | ORAL_TABLET | Freq: Every day | ORAL | 1 refills | Status: DC

## 2021-11-09 NOTE — Telephone Encounter (Signed)
Patient calls at 5:17pm requesting refill on her warfarin. Will send refill authorization to [facility] per her instructions. Taking 15mg  (2x7.5mg  yellow warfarin tablets PO daily).

## 2021-11-09 NOTE — Progress Notes (Signed)
Patient requests use of 5mg  strength peach colored tablets in deference to the 2 x 7.5mg  strength yellow tablets. Have discontinued the 7.5mg  warfarin prescription and entered a new prescription for 5mg  strength warfarin tablets. 3 x 5mg  tablets daily. Number 84 tablets.

## 2021-11-12 ENCOUNTER — Ambulatory Visit: Payer: Medicaid Other | Admitting: Pharmacist

## 2021-11-12 DIAGNOSIS — D6859 Other primary thrombophilia: Secondary | ICD-10-CM | POA: Diagnosis not present

## 2021-11-12 DIAGNOSIS — Z7901 Long term (current) use of anticoagulants: Secondary | ICD-10-CM | POA: Diagnosis not present

## 2021-11-12 LAB — POCT INR: INR: 2.8 (ref 2.0–3.0)

## 2021-11-12 NOTE — Patient Instructions (Signed)
Patient instructed to take medications as defined in the Anti-coagulation Track section of this encounter.  Patient instructed to take today's dose.  Patient instructed to take three (3) tablets of 5mg  once-daily at 6:00 pm.   Patient verbalized understanding of these instructions.
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
## 2021-11-14 NOTE — Progress Notes (Signed)
Evaluation and management procedures were performed by the Clinical Pharmacy Practitioner under my supervision and collaboration. I have reviewed the Practitioner's note and chart, and I agree with the management and plan as documented above. ° °

## 2021-12-10 ENCOUNTER — Other Ambulatory Visit: Payer: Self-pay | Admitting: Pharmacist

## 2021-12-10 DIAGNOSIS — Z7901 Long term (current) use of anticoagulants: Secondary | ICD-10-CM

## 2021-12-10 DIAGNOSIS — D6859 Other primary thrombophilia: Secondary | ICD-10-CM

## 2021-12-10 MED ORDER — WARFARIN SODIUM 5 MG PO TABS: 15.0000 mg | ORAL_TABLET | Freq: Every day | ORAL | 3 refills | Status: DC
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
## 2021-12-18 ENCOUNTER — Ambulatory Visit: Payer: Medicaid Other | Admitting: Neurology

## 2021-12-18 ENCOUNTER — Encounter: Payer: Self-pay | Admitting: Neurology

## 2021-12-18 VITALS — BP 114/74 | HR 84 | Ht 64.0 in | Wt 220.0 lb

## 2021-12-18 DIAGNOSIS — Z9989 Dependence on other enabling machines and devices: Secondary | ICD-10-CM | POA: Diagnosis not present

## 2021-12-18 DIAGNOSIS — G4733 Obstructive sleep apnea (adult) (pediatric): Secondary | ICD-10-CM | POA: Insufficient documentation

## 2021-12-21 ENCOUNTER — Encounter (HOSPITAL_COMMUNITY): Payer: Self-pay

## 2021-12-21 ENCOUNTER — Other Ambulatory Visit: Payer: Self-pay

## 2021-12-21 ENCOUNTER — Ambulatory Visit (HOSPITAL_COMMUNITY)
Admission: EM | Admit: 2021-12-21 | Discharge: 2021-12-21 | Disposition: A | Discharge status: Home / Self Care | Payer: Medicaid Other | Attending: Internal Medicine | Admitting: Internal Medicine

## 2021-12-21 DIAGNOSIS — H6593 Unspecified nonsuppurative otitis media, bilateral: Secondary | ICD-10-CM

## 2021-12-21 MED ORDER — FLUTICASONE PROPIONATE 50 MCG/ACT NA SUSP: 1.0000 | Freq: Every day | NASAL | 0 refills | Status: AC

## 2021-12-21 NOTE — ED Triage Notes (Signed)
Pt c/o bilateral ear pain since last night. Took tylenol with no relief.  ?

## 2021-12-21 NOTE — Discharge Instructions (Signed)
It appears that you have fluid behind your eardrums.  This is being treated with Flonase nasal spray as this is the safest medication for you for this issue given that you take Coumadin and other medications daily.  Please follow-up if symptoms persist or worsen. ?
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
## 2021-12-24 ENCOUNTER — Ambulatory Visit: Payer: Medicaid Other

## 2022-01-09 ENCOUNTER — Emergency Department (HOSPITAL_COMMUNITY)
Admission: EM | Admit: 2022-01-09 | Discharge: 2022-01-09 | Disposition: A | Discharge status: Home / Self Care | Payer: Medicaid Other | Attending: Student | Admitting: Student

## 2022-01-09 ENCOUNTER — Other Ambulatory Visit: Payer: Self-pay

## 2022-01-09 ENCOUNTER — Encounter (HOSPITAL_COMMUNITY): Payer: Self-pay | Admitting: Emergency Medicine

## 2022-01-09 DIAGNOSIS — Z7901 Long term (current) use of anticoagulants: Secondary | ICD-10-CM | POA: Diagnosis not present

## 2022-01-09 DIAGNOSIS — R000 Tachycardia, unspecified: Secondary | ICD-10-CM | POA: Diagnosis not present

## 2022-01-09 DIAGNOSIS — Z79899 Other long term (current) drug therapy: Secondary | ICD-10-CM | POA: Diagnosis not present

## 2022-01-09 DIAGNOSIS — M62830 Muscle spasm of back: Secondary | ICD-10-CM

## 2022-01-09 DIAGNOSIS — M545 Low back pain, unspecified: Secondary | ICD-10-CM | POA: Diagnosis present

## 2022-01-09 MED ORDER — LIDOCAINE 5 % EX PTCH
2.0000 | MEDICATED_PATCH | CUTANEOUS | Status: DC
  Administered 2022-01-09: 2 via TRANSDERMAL
  Filled 2022-01-09: qty 2

## 2022-01-09 MED ORDER — METHOCARBAMOL 500 MG PO TABS: 500.0000 mg | ORAL_TABLET | Freq: Two times a day (BID) | ORAL | 0 refills | Status: DC

## 2022-01-09 NOTE — Discharge Instructions (Addendum)
You were seen in the emergency department today for your back pain.  Your physical exam and vital signs are very reassuring. ? ?The muscles in your mid back are in what is called spasm, meaning they are inappropriately tightened up.  This can be quite painful.  To help with your pain you may take Tylenol to help with your pain.  Additionally, you have been prescribed a muscle relaxer called Robaxin to help relieve some of the muscle spasm.  Please be advised that this medication may make you very sleepy, so you should not drive or operate heavy machinery while you are taking it. ? ?You may also utilize topical pain relief such as Biofreeze, IcyHot, or topical lidocaine patches.  I also recommend that you apply heat to the area, such as a hot shower or heating pad, and follow heat application with massage of the muscles that are most tight. ? ?Please return to the emergency department if you develop any numbness/tingling/weakness in your arms or legs, any difficulty urinating, or urinary incontinence chest pain, shortness of breath, abdominal pain, nausea or vomiting that does not stop, or any other new severe symptoms. ? ?

## 2022-01-09 NOTE — ED Provider Notes (Signed)
?[place] ?Provider Note ? ? ?CSN: [id number] ?Arrival date & time: 01/09/22  0608 ? ?  ? ?History ? ?Chief Complaint  ?Patient presents with  ? Back Pain  ? ? ?Michele Gillespie is a 46 y.o. 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  She denies any radiation of the pain down her legs, denies any new numbness, tingling, weakness in her legs or any saddle anesthesia.  No recent trauma.  No history of IV drug use or immunocompromising disease. ? ?She states she is been taking an expired prescription of muscle relaxers which has not helped her pain as well as Tylenol.  She cannot take NSAIDs due to history of anticoagulation for PEs.  She is anticoagulated on warfarin.  I have personally reviewed her medical records.  In addition to the above listed concerns she has history of asthma, schizophrenia, sleep apnea on CPAP and clotting disorder. ? ?HPI ? ?  ? ?Home Medications ?Prior to Admission medications   ?Medication Sig Start Date End Date Taking? Authorizing Provider  ?methocarbamol (ROBAXIN) 500 MG tablet Take 1 tablet (500 mg total) by mouth 2 (two) times daily. 01/09/22  Yes Orren Pietsch, Gypsy Balsam, PA-C  ?acetaminophen (TYLENOL) 500 MG tablet Take 1,000 mg by mouth every 6 (six) hours as needed for moderate pain or headache.    [provider]  ?albuterol (PROVENTIL HFA) 108 (90 Base) MCG/ACT inhaler INHALE 2 PUFFS INTO THE LUNGS EVERY 6 HOURS AS NEEDED FOR WHEEZING. FOR SHORTNESS OF BREATH 02/12/21   Jose Persia, MD  ?amLODipine (NORVASC) 5 MG tablet Take 1 tablet (5 mg total) by mouth daily. 09/26/21   Jose Persia, MD  ?ARIPiprazole (ABILIFY) 10 MG tablet Take 1 tablet (10 mg total) by mouth daily. 05/21/21   Patrecia Pour, NP  ?citalopram (CELEXA) 10 MG tablet Take 1 tablet (10 mg total) by mouth daily. 09/26/21 09/26/22  Jose Persia, MD   ?divalproex (DEPAKOTE) 500 MG DR tablet Take 1 tablet (500 mg total) by mouth 2 (two) times daily. 09/26/21 09/26/22  Jose Persia, MD  ?fluticasone (FLONASE) 50 MCG/ACT nasal spray Place 1 spray into both nostrils daily. 12/21/21   Teodora Medici, FNP  ?linaclotide (LINZESS) 72 MCG capsule Take 1 capsule (72 mcg total) by mouth daily before breakfast. 09/26/21   Jose Persia, MD  ?omeprazole (PRILOSEC) 40 MG capsule TAKE 1 CAPSULE (40 MG TOTAL) BY MOUTH 2 (TWO) TIMES DAILY BEFORE A MEAL. 06/25/21   Thornton Park, MD  ?warfarin (COUMADIN) 5 MG tablet Take 3 tablets (15 mg total) by mouth daily at 4 PM. 12/10/21   Pennie Banter, RPH-CPP  ?   ? ?Allergies    ?Patient has no known allergies.   ? ?Review of Systems   ?Review of Systems  ?Musculoskeletal:  Positive for back pain.  ?Neurological:  Negative for weakness and numbness.  ? ?Physical Exam ?Updated Vital Signs ?BP (!) 144/96 (BP Location: Right Arm)   Pulse (!) 103   Temp 98.2 ?F (36.8 ?C) (Oral)   Resp 18   SpO2 100%  ?Physical Exam ?Vitals and nursing note reviewed.  ?Constitutional:   ?   Appearance: She is not ill-appearing or toxic-appearing.  ?HENT:  ?   Head: Normocephalic and atraumatic.  ?   Nose: Nose normal.  ?   Mouth/Throat:  ?   Mouth:  Mucous membranes are moist.  ?   Pharynx: No oropharyngeal exudate or posterior oropharyngeal erythema.  ?Eyes:  ?   General:     ?   Right eye: No discharge.     ?   Left eye: No discharge.  ?   Conjunctiva/sclera: Conjunctivae normal.  ?Cardiovascular:  ?   Rate and Rhythm: Normal rate and regular rhythm.  ?   Pulses: Normal pulses.  ?Pulmonary:  ?   Effort: Pulmonary effort is normal. No respiratory distress.  ?   Breath sounds: Normal breath sounds. No wheezing or rales.  ?Abdominal:  ?   General: Bowel sounds are normal. There is no distension.  ?   Palpations: Abdomen is soft.  ?   Tenderness: There is no abdominal tenderness. There is no guarding or rebound.  ?Musculoskeletal:     ?   General: No  deformity.  ?   Cervical back: Normal and neck supple. No bony tenderness.  ?   Thoracic back: Spasms and tenderness present. No bony tenderness. Normal range of motion.  ?   Lumbar back: Spasms present. No tenderness or bony tenderness.  ?   Right lower leg: No edema.  ?   Left lower leg: No edema.  ?   Comments: Metric strength and sensation in the upper and lower extremities bilaterally.   ?Skin: ?   General: Skin is warm and dry.  ?   Capillary Refill: Capillary refill takes less than 2 seconds.  ?Neurological:  ?   General: No focal deficit present.  ?   Mental Status: She is alert and oriented to person, place, and time. Mental status is at baseline.  ?   GCS: GCS eye subscore is 4. GCS verbal subscore is 5. GCS motor subscore is 6.  ?   Sensory: Sensation is intact.  ?   Motor: Motor function is intact.  ?   Gait: Gait is intact.  ?Psychiatric:     ?   Mood and Affect: Mood normal.  ? ? ?ED Results / Procedures / Treatments   ?Labs ?(all labs ordered are listed, but only abnormal results are displayed) ?Labs Reviewed - No data to display ? ?EKG ?None ? ?Radiology ?No results found. ? ?Procedures ?Procedures  ? ? ?Medications Ordered in ED ?Medications - No data to display ? ?ED Course/ Medical Decision Making/ A&P ?  ?                        ?Medical Decision Making ?46 year old female who presents with concern for low back pain.  ? ?Mildly tachycardic and hypertensive on intake, cardiopulmonary exam is normal, abdominal exam is benign. Patient is neurvascularly intact in all extremities with normal sensation and strength in upper and lower extremities bilaterally. Ambulatory in the ED. Spasm and TTP of the thoracic paraspinous musculature bilaterally without midline TTP or skin changes.  ? ?The emergent differential diagnosis for low back pain includes acute ligamentous and muscular injury, cord compression syndrome, pathologic fracture, transverse myelitis, vertebral osteomyelitis, discitis, and epidural  abscess. ? ? ?Risk ?Prescription drug management. ? ? ?Michele Gillespie is without any red flag signs or symptoms of low back pain and her physical exam is reassuring without midline tenderness palpation.  She has bilateral thoracic paraspinous musculature spasm; suspect muscle strain.  Lidoderm patch is offered in the emergency department.  Will discharge with new prescription for muscle relaxer.  Recommend continued application of topical analgesia, usage of Tylenol,  and close outpatient follow-up.  Will provide work note with recommendation for light duty to outpatient heel.  Recommend close outpatient PCP follow-up. ? ?Clinical concern for more emergent underlying etiology that would warrant further ED work-up or inpatient management is exceedingly low.Doubt cauda equina syndrome given normal neurologic evaluation,.  ? ?Michele Gillespie voiced understanding of her medical evaluation and treatment plan. Each of their questions answered to their expressed satisfaction.  Return precautions were given.  Patient is well-appearing, stable, and was discharged in good condition. ? ?This chart was dictated using voice recognition software, Dragon. Despite the best efforts of this provider to proofread and correct errors, errors may still occur which can change documentation meaning. ? ? ?Final Clinical Impression(s) / ED Diagnoses ?Final diagnoses:  ?Muscle spasm of back  ? ? ?Rx / DC Orders ?ED Discharge Orders   ? ?      Ordered  ?  methocarbamol (ROBAXIN) 500 MG tablet  2 times daily       ? 01/09/22 0720  ? ?  ?  ? ?  ? ? ?  ?Emeline Darling, PA-C ?01/11/22 8768 ? ?  ?Merryl Hacker, MD ?01/14/22 [phone] ? ?

## 2022-01-09 NOTE — ED Triage Notes (Signed)
Patient here with mid back pain.  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  She has to roll tires that are defective and it has been aggravating her back. Patient states that the pain has been going on for the last two weeks, but has increased to where it is now bothering her when she sleeps.  Patient states she has been taking an old prescription of muscle relaxer's, but it has not helped.   ?

## 2022-01-15 ENCOUNTER — Ambulatory Visit: Payer: Medicaid Other | Admitting: Pharmacist

## 2022-01-15 DIAGNOSIS — D6859 Other primary thrombophilia: Secondary | ICD-10-CM

## 2022-01-15 DIAGNOSIS — Z7901 Long term (current) use of anticoagulants: Secondary | ICD-10-CM | POA: Diagnosis not present

## 2022-01-15 LAB — POCT INR: INR: 3 (ref 2.0–3.0)
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
## 2022-01-15 NOTE — Patient Instructions (Signed)
Patient instructed to take medications as defined in the Anti-coagulation Track section of this encounter.  ?Patient instructed to take today's dose.  ?Patient instructed to take three (3) tablets of '5mg'$  once-daily at 6:00 pm--EXCEPT on TUESDAYS, take ONLY two-and-one-half (2&1/2) of your '5mg'$  tablets on Tuesdays. ?Patient verbalized understanding of these instructions.   ?

## 2022-01-16 NOTE — [facility] Notes (Signed)
INTERNAL MEDICINE TEACHING ATTENDING ADDENDUM - Destan Franchini M.D  Duration- indefinite, Indication- recurrent VTE, INR- therapeutic. Agree with pharmacy recommendations as outlined in their note.     

## 2022-01-29 ENCOUNTER — Encounter: Payer: Self-pay | Admitting: Neurology

## 2022-02-07 ENCOUNTER — Other Ambulatory Visit: Payer: Self-pay

## 2022-02-07 ENCOUNTER — Emergency Department (HOSPITAL_COMMUNITY)
Admission: EM | Admit: 2022-02-07 | Discharge: 2022-02-08 | Disposition: A | Discharge status: Home / Self Care | Payer: Medicaid Other | Attending: Emergency Medicine | Admitting: Emergency Medicine

## 2022-02-07 ENCOUNTER — Encounter (HOSPITAL_COMMUNITY): Payer: Self-pay | Admitting: Emergency Medicine

## 2022-02-07 DIAGNOSIS — H9203 Otalgia, bilateral: Secondary | ICD-10-CM | POA: Insufficient documentation

## 2022-02-07 DIAGNOSIS — Z7901 Long term (current) use of anticoagulants: Secondary | ICD-10-CM | POA: Diagnosis not present

## 2022-02-07 DIAGNOSIS — H9209 Otalgia, unspecified ear: Secondary | ICD-10-CM | POA: Diagnosis present

## 2022-02-07 DIAGNOSIS — L988 Other specified disorders of the skin and subcutaneous tissue: Secondary | ICD-10-CM | POA: Insufficient documentation

## 2022-02-07 DIAGNOSIS — R21 Rash and other nonspecific skin eruption: Secondary | ICD-10-CM

## 2022-02-07 NOTE — ED Provider Triage Note (Signed)
Emergency Medicine Provider Triage Evaluation Note ? ?Michele Gillespie , a 46 y.o. female  was evaluated in triage.  Pt complains of bilateral ear fullness, and rash to bilateral hands.  Denies fever or chills.  Uses Flonase.  Denies shortness of breath, chest pain. ? ?Review of Systems  ?Positive: As above ?Negative: As above ? ?Physical Exam  ?BP 124/90   Pulse 74   Temp 99.3 ?F (37.4 ?C) (Oral)   Resp 16   Wt 102.1 kg   SpO2 98%   BMI 38.62 kg/m?  ?Gen:   Awake, no distress   ?Resp:  Normal effort  ?MSK:   Moves extremities without difficulty  ?Other:   ? ?Medical Decision Making  ?Medically screening exam initiated at 9:07 PM.  Appropriate orders placed.  Michele Gillespie was informed that the remainder of the evaluation will be completed by another provider, this initial triage assessment does not replace that evaluation, and the importance of remaining in the ED until their evaluation is complete. ? ? ?  ?Evlyn Courier, PA-C ?02/07/22 2108 ? ?

## 2022-02-07 NOTE — ED Triage Notes (Signed)
Pt presents for B ear fullness and pain that has not improved despite being prescribed nasal spray for allergies. Now has HA, pain in ear while belching. ?Denies fever home, N/V, sore throat, hearing changes.  ? ? ?Would also like to be evaluated for latex allergy, has noticed a rash on hands that started after using latex gloves. ?

## 2022-02-08 ENCOUNTER — Encounter (HOSPITAL_COMMUNITY): Payer: Self-pay | Admitting: Emergency Medicine

## 2022-02-08 MED ORDER — PREDNISONE 20 MG PO TABS
40.0000 mg | ORAL_TABLET | Freq: Once | ORAL | Status: AC
  Administered 2022-02-08: 40 mg via ORAL
  Filled 2022-02-08: qty 2

## 2022-02-08 MED ORDER — LORATADINE 10 MG PO TABS
10.0000 mg | ORAL_TABLET | Freq: Once | ORAL | Status: AC
  Administered 2022-02-08: 10 mg via ORAL
  Filled 2022-02-08: qty 1

## 2022-02-08 MED ORDER — PREDNISONE 20 MG PO TABS: ORAL_TABLET | ORAL | 0 refills | Status: DC

## 2022-02-08 MED ORDER — LORATADINE 10 MG PO TABS: 10.0000 mg | ORAL_TABLET | Freq: Every day | ORAL | 0 refills | Status: DC

## 2022-02-08 NOTE — ED Notes (Signed)
Pt currently in scanners. Transport will bring to hallway once completed ?

## 2022-02-08 NOTE — ED Provider Notes (Signed)
?[facility] ?Provider Note ? ? ?CSN: [id number] ?Arrival date & time: 02/07/22  2013 ? ?  ? ?History ? ?Chief Complaint  ?Patient presents with  ? Ear Fullness  ? ? ?Michele Gillespie is a 46 y.o. female. ? ?The history is provided by the patient.  ?Ear Fullness ?This is a chronic problem. The current episode started more than 1 week ago (many months). The problem occurs constantly. The problem has not changed since onset.Pertinent negatives include no chest pain, no abdominal pain, no headaches and no shortness of breath. Nothing aggravates the symptoms. Nothing relieves the symptoms. The treatment provided no relief.  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   ?  ? ?Home Medications ?Prior to Admission medications   ?Medication Sig Start Date End Date Taking? Authorizing Provider  ?acetaminophen (TYLENOL) 500 MG tablet Take 1,000 mg by mouth every 6 (six) hours as needed for moderate pain or headache. ?Patient not taking: Reported on 01/15/2022    [provider]  ?albuterol (PROVENTIL HFA) 108 (90 Base) MCG/ACT inhaler INHALE 2 PUFFS INTO THE LUNGS EVERY 6 HOURS AS NEEDED FOR WHEEZING. FOR SHORTNESS OF BREATH 02/12/21   Jose Persia, MD  ?amLODipine (NORVASC) 5 MG tablet Take 1 tablet (5 mg total) by mouth daily. 09/26/21   Jose Persia, MD  ?ARIPiprazole (ABILIFY) 10 MG tablet Take 1 tablet (10 mg total) by mouth daily. 05/21/21   Patrecia Pour, NP  ?citalopram (CELEXA) 10 MG tablet Take 1 tablet (10 mg total) by mouth daily. 09/26/21 09/26/22  Jose Persia, MD  ?divalproex (DEPAKOTE) 500 MG DR tablet Take 1 tablet (500 mg total) by mouth 2 (two) times daily. 09/26/21 09/26/22  Jose Persia, MD  ?fluticasone (FLONASE) 50 MCG/ACT nasal spray Place 1 spray into both nostrils daily. 12/21/21   Teodora Medici, FNP  ?linaclotide (LINZESS) 72 MCG capsule Take 1 capsule (72 mcg total) by mouth daily before breakfast. ?Patient  not taking: Reported on 01/15/2022 09/26/21   Jose Persia, MD  ?methocarbamol (ROBAXIN) 500 MG tablet Take 1 tablet (500 mg total) by mouth 2 (two) times daily. 01/09/22   Sponseller, Gypsy Balsam, PA-C  ?omeprazole (PRILOSEC) 40 MG capsule TAKE 1 CAPSULE (40 MG TOTAL) BY MOUTH 2 (TWO) TIMES DAILY BEFORE A MEAL. 06/25/21   Thornton Park, MD  ?warfarin (COUMADIN) 5 MG tablet Take 3 tablets (15 mg total) by mouth daily at 4 PM. 12/10/21   Pennie Banter, RPH-CPP  ?   ? ?Allergies    ?Patient has no known allergies.   ? ?Review of Systems   ?Review of Systems  ?Constitutional:  Negative for fever.  ?HENT:  Negative for drooling, ear discharge, hearing loss and mouth sores.   ?Eyes:  Negative for photophobia.  ?Respiratory:  Negative for shortness of breath.   ?Cardiovascular:  Negative for chest pain.  ?Gastrointestinal:  Negative for abdominal pain.  ?Skin:  Positive for rash.  ?Neurological:  Negative for headaches.  ?Psychiatric/Behavioral:  Negative for agitation.   ?All other systems reviewed and are negative. ? ?Physical Exam ?Updated Vital Signs ?BP 124/90   Pulse 74   Temp 99.3 ?F (37.4 ?C) (Oral)   Resp 16   Wt 102.1 kg   SpO2 98%   BMI 38.62 kg/m?  ?Physical Exam ?Vitals and nursing note reviewed.  ?Constitutional:   ?   General: She is not in acute distress. ?   Appearance: Normal  appearance.  ?HENT:  ?   Head: Normocephalic and atraumatic.  ?   Right Ear: Tympanic membrane, ear canal and external ear normal. There is no impacted cerumen.  ?   Left Ear: Tympanic membrane, ear canal and external ear normal. There is no impacted cerumen.  ?   Nose: Nose normal.  ?Eyes:  ?   Conjunctiva/sclera: Conjunctivae normal.  ?   Pupils: Pupils are equal, round, and reactive to light.  ?Cardiovascular:  ?   Rate and Rhythm: Normal rate and regular rhythm.  ?   Pulses: Normal pulses.  ?   Heart sounds: Normal heart sounds.  ?Pulmonary:  ?   Effort: Pulmonary effort is normal.  ?   Breath sounds: Normal breath  sounds.  ?Abdominal:  ?   General: Bowel sounds are normal.  ?   Palpations: Abdomen is soft.  ?   Tenderness: There is no abdominal tenderness.  ?Musculoskeletal:     ?   General: Normal range of motion.  ?   Cervical back: Normal range of motion and neck supple. No tenderness.  ?Lymphadenopathy:  ?   Cervical: No cervical adenopathy.  ?Skin: ?   General: Skin is warm and dry.  ?   Capillary Refill: Capillary refill takes less than 2 seconds.  ?   Comments: Papular eruption of the dorsum of B hands, no lesions on the palms   ?Neurological:  ?   General: No focal deficit present.  ?   Mental Status: She is alert and oriented to person, place, and time.  ?   Deep Tendon Reflexes: Reflexes normal.  ?Psychiatric:     ?   Mood and Affect: Mood normal.     ?   Behavior: Behavior normal.  ? ? ?ED Results / Procedures / Treatments   ?Labs ?(all labs ordered are listed, but only abnormal results are displayed) ?Labs Reviewed - No data to display ? ?EKG ?None ? ?Radiology ?No results found. ? ?Procedures ?Procedures  ? ? ?Medications Ordered in ED ?Medications  ?predniSONE (DELTASONE) tablet 40 mg (has no administration in time range)  ?loratadine (CLARITIN) tablet 10 mg (has no administration in time range)  ? ? ?ED Course/ Medical Decision Making/ A&P ?  ?                        ?Medical Decision Making ?B ear pain chronic, chronic lesions of the dorsum of B hands, has not talked to her PMD regarding this  ? ?Amount and/or Complexity of Data Reviewed ?External Data Reviewed: notes. ?   Details: previous notes reviewed ? ?Risk ?OTC drugs. ?Prescription drug management. ?Risk Details: Eruption of the skin of B dorsum of the hands, claritin and steroids and follow up with PMD.  Regarding ear pain follow up with ENT for ongoing care  ? ? ? ?Final Clinical Impression(s) / ED Diagnoses ?Final diagnoses:  ?None  ? ?Return for intractable cough, coughing up blood, fevers > 100.4 unrelieved by medication, shortness of breath,  intractable vomiting, chest pain, shortness of breath, weakness, numbness, changes in speech, facial asymmetry, abdominal pain, passing out, Inability to tolerate liquids or food, cough, altered mental status or any concerns. No signs of systemic illness or infection. The patient is nontoxic-appearing on exam and vital signs are within normal limits.  ?I have reviewed the triage vital signs and the nursing notes. Pertinent labs & imaging results that were available during my care of the patient were reviewed  by me and considered in my medical decision making (see chart for details). After history, exam, and medical workup I feel the patient has been appropriately medically screened and is safe for discharge home. Pertinent diagnoses were discussed with the patient. Patient was given return precautions. ?  ?Rx / DC Orders ?ED Discharge Orders   ? ? None  ? ?  ? ? ?  ?Darryn Kydd, MD ?02/08/22 0306 ? ?

## 2022-03-04 ENCOUNTER — Ambulatory Visit: Payer: Medicaid Other | Admitting: Pharmacist

## 2022-03-04 DIAGNOSIS — Z7901 Long term (current) use of anticoagulants: Secondary | ICD-10-CM | POA: Diagnosis not present

## 2022-03-04 DIAGNOSIS — D6859 Other primary thrombophilia: Secondary | ICD-10-CM

## 2022-03-04 LAB — POCT INR: INR: 1.9 — AB (ref 2.0–3.0)
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
## 2022-03-05 NOTE — Progress Notes (Signed)
INR value entered yesterday of 1.9 was entered in error. [email or web address].[email or web address] software as well as memory from the point of care device indicate that the INR was 3.3. Clinical decision making (dose reduction) and indication of supra-therapeutic INR (dose reduction) reflect the correct value of 3.3

## 2022-04-01 ENCOUNTER — Ambulatory Visit: Payer: Medicaid Other

## 2022-04-02 ENCOUNTER — Other Ambulatory Visit: Payer: Self-pay | Admitting: Pharmacist

## 2022-04-02 DIAGNOSIS — Z7901 Long term (current) use of anticoagulants: Secondary | ICD-10-CM

## 2022-04-02 DIAGNOSIS — D6859 Other primary thrombophilia: Secondary | ICD-10-CM

## 2022-04-02 MED ORDER — WARFARIN SODIUM 5 MG PO TABS: ORAL_TABLET | ORAL | 3 refills | Status: DC

## 2022-04-02 NOTE — Telephone Encounter (Signed)
Requests refill authorization on her warfarin. Will send electronically.

## 2022-04-09 ENCOUNTER — Emergency Department (HOSPITAL_COMMUNITY): Payer: Medicaid Other

## 2022-04-09 ENCOUNTER — Emergency Department (HOSPITAL_COMMUNITY)
Admission: EM | Admit: 2022-04-09 | Discharge: 2022-04-09 | Disposition: A | Discharge status: Home / Self Care | Payer: Medicaid Other | Attending: Emergency Medicine | Admitting: Emergency Medicine

## 2022-04-09 ENCOUNTER — Encounter (HOSPITAL_COMMUNITY): Payer: Self-pay | Admitting: Emergency Medicine

## 2022-04-09 ENCOUNTER — Other Ambulatory Visit: Payer: Self-pay

## 2022-04-09 DIAGNOSIS — R296 Repeated falls: Secondary | ICD-10-CM | POA: Diagnosis not present

## 2022-04-09 DIAGNOSIS — M25512 Pain in left shoulder: Secondary | ICD-10-CM | POA: Diagnosis not present

## 2022-04-09 DIAGNOSIS — Z79899 Other long term (current) drug therapy: Secondary | ICD-10-CM | POA: Diagnosis not present

## 2022-04-09 DIAGNOSIS — M898X1 Other specified disorders of bone, shoulder: Secondary | ICD-10-CM

## 2022-04-09 DIAGNOSIS — Z7901 Long term (current) use of anticoagulants: Secondary | ICD-10-CM | POA: Diagnosis not present

## 2022-04-09 MED ORDER — PREDNISONE 50 MG PO TABS: ORAL_TABLET | ORAL | 0 refills | Status: DC

## 2022-04-09 NOTE — Discharge Instructions (Signed)
Return if any problems.

## 2022-04-09 NOTE — ED Notes (Signed)
Discharge instructions reviewed with patient. Patient denies any questions or concerns. Pt ambulatory out of the department.

## 2022-04-09 NOTE — ED Provider Notes (Signed)
[facility] EMERGENCY DEPARTMENT Provider Note   CSN: [id number] Arrival date & time: 04/09/22  0105     History  Chief Complaint  Patient presents with   Shoulder Pain    Michele Gillespie is a 46 y.o. female.  Pt complains of pain in her let shoulder and left clavicle.  Pt reports she frequently falls.  Pt it shoulder a week ago.  Pt reports increased pain.  Pt also does retentive work.  She has a history of dvt and pe's.  Pt denies any swelling or shortness of breath.  Pt is complaint with her medications (coumadin).  PT denies concern for dvt or pe.    The history is provided by the patient. No language interpreter was used.  Shoulder Pain Location:  Clavicle Clavicle location:  L clavicle Injury: yes   Time since incident:  1 week Pain details:    Quality:  Aching   Radiates to:  R shoulder   Severity:  Moderate   Onset quality:  Gradual   Duration:  1 week   Timing:  Constant   Progression:  Worsening Dislocation: no   Relieved by:  Nothing Ineffective treatments:  Acetaminophen Associated symptoms: no back pain        Home Medications Prior to Admission medications   Medication Sig Start Date End Date Taking? Authorizing Provider  predniSONE (DELTASONE) 50 MG tablet One tablet a day for 5 days 04/09/22  Yes Caryl Ada K, PA-C  acetaminophen (TYLENOL) 500 MG tablet Take 1,000 mg by mouth every 6 (six) hours as needed for moderate pain or headache. Patient not taking: Reported on 01/15/2022    [provider]  albuterol (PROVENTIL HFA) 108 (90 Base) MCG/ACT inhaler INHALE 2 PUFFS INTO THE LUNGS EVERY 6 HOURS AS NEEDED FOR WHEEZING. FOR SHORTNESS OF BREATH 02/12/21   Jose Persia, MD  amLODipine (NORVASC) 5 MG tablet Take 1 tablet (5 mg total) by mouth daily. 09/26/21   Jose Persia, MD  ARIPiprazole (ABILIFY) 10 MG tablet Take 1 tablet (10 mg total) by mouth daily. 05/21/21   Patrecia Pour, NP  citalopram (CELEXA) 10 MG tablet Take  1 tablet (10 mg total) by mouth daily. 09/26/21 09/26/22  Jose Persia, MD  divalproex (DEPAKOTE) 500 MG DR tablet Take 1 tablet (500 mg total) by mouth 2 (two) times daily. 09/26/21 09/26/22  Jose Persia, MD  fluticasone (FLONASE) 50 MCG/ACT nasal spray Place 1 spray into both nostrils daily. 12/21/21   Teodora Medici, FNP  linaclotide [facility]) 72 MCG capsule Take 1 capsule (72 mcg total) by mouth daily before breakfast. 09/26/21   Jose Persia, MD  loratadine (CLARITIN) 10 MG tablet Take 1 tablet (10 mg total) by mouth daily. 02/08/22   Palumbo, April, MD  methocarbamol (ROBAXIN) 500 MG tablet Take 1 tablet (500 mg total) by mouth 2 (two) times daily. 01/09/22   Sponseller, Gypsy Balsam, PA-C  omeprazole (PRILOSEC) 40 MG capsule TAKE 1 CAPSULE (40 MG TOTAL) BY MOUTH 2 (TWO) TIMES DAILY BEFORE A MEAL. 06/25/21   Thornton Park, MD  warfarin (COUMADIN) 5 MG tablet Take three (3) tablets daily--EXCEPT on Mondays and Thursdays--take only 2 & 1/2 tablets on Mondays and Thursdays. 04/02/22   Pennie Banter, RPH-CPP      Allergies    Patient has no known allergies.    Review of Systems   Review of Systems  Musculoskeletal:  Negative for back pain.  All other systems reviewed and are negative.  Physical Exam Updated Vital Signs BP 109/88   Pulse 72   Temp 98.5 F (36.9 C) (Oral)   Resp 18   SpO2 96%  Physical Exam Vitals and nursing note reviewed.  Constitutional:      Appearance: She is well-developed.  HENT:     Head: Normocephalic.  Cardiovascular:     Rate and Rhythm: Normal rate.  Pulmonary:     Effort: Pulmonary effort is normal.     Breath sounds: Normal breath sounds.  Abdominal:     General: There is no distension.  Musculoskeletal:        General: Tenderness present. No swelling.     Cervical back: Normal range of motion.     Comments: Tender ac joint and shoulder from with discomfort  no swelling    Skin:    General: Skin is warm.  Neurological:     General:  No focal deficit present.     Mental Status: She is alert and oriented to person, place, and time.     ED Results / Procedures / Treatments   Labs (all labs ordered are listed, but only abnormal results are displayed) Labs Reviewed - No data to display  EKG None  Radiology DG Shoulder Right  Result Date: 04/09/2022 CLINICAL DATA:  Shoulder pain EXAM: RIGHT SHOULDER - 2+ VIEW COMPARISON:  None Available. FINDINGS: There is no evidence of fracture or dislocation. There is no evidence of arthropathy or other focal bone abnormality. Soft tissues are unremarkable. IMPRESSION: Negative. Electronically Signed   By: Donavan Foil M.D.   On: 04/09/2022 02:33    Procedures Procedures    Medications Ordered in ED Medications - No data to display  ED Course/ Medical Decision Making/ A&P                           Medical Decision Making Risk Prescription drug management.   MDM:  Pt symptoms seem muscular.  I doubt dvt. I will try pt on prednisone for 5 days.  Pt advised to follow up with per primary care MD         Final Clinical Impression(s) / ED Diagnoses Final diagnoses:  Pain of right clavicle    Rx / DC Orders ED Discharge Orders          Ordered    predniSONE (DELTASONE) 50 MG tablet        04/09/22 0755           An After Visit Summary was printed and given to the patient.    Fransico Meadow, [place] 04/09/22 8502    Godfrey Pick, MD 04/12/22 1159

## 2022-04-09 NOTE — ED Provider Triage Note (Signed)
Emergency Medicine Provider Triage Evaluation Note  Michele Gillespie , a 46 y.o. female  was evaluated in triage.  Pt complains of right shoulder pain.  Patient states that symptoms began 1 week ago.  She describes it as a bony pain.  She states that she has frequent falls from her lupus and does not remember falling onto her right arm.  She has chronic bilateral numbness and tingling to her upper extremities.  She denies any weakness..  Review of Systems  Positive: See above Negative:   Physical Exam  BP (!) 144/97 (BP Location: Right Arm)   Pulse 83   Temp 98.5 F (36.9 C) (Oral)   Resp 18   SpO2 99%  Gen:   Awake, no distress   Resp:  Normal effort  MSK:   Moves extremities without difficulty  Other:  Tenderness to palpation of the right shoulder, radial pulse 2+.  Skin is warm and dry.  Medical Decision Making  Medically screening exam initiated at 2:11 AM.  Appropriate orders placed.  Michele Gillespie was informed that the remainder of the evaluation will be completed by another provider, this initial triage assessment does not replace that evaluation, and the importance of remaining in the ED until their evaluation is complete.    Mickie Hillier, PA-C 04/09/22 [phone]

## 2022-04-09 NOTE — ED Triage Notes (Signed)
Pt reported to ED for evaluation of rt shoulder pain that has been ongoing for over 1 week. Pt states that pain increases with movement. Pt cannot express any known injury but states she has a hx of Lupus and has frequent falls d/t lower extremity weakness.

## 2022-04-26 ENCOUNTER — Ambulatory Visit: Payer: Medicaid Other | Admitting: Family

## 2022-05-31 ENCOUNTER — Other Ambulatory Visit: Payer: Self-pay | Admitting: Gastroenterology

## 2022-05-31 DIAGNOSIS — K259 Gastric ulcer, unspecified as acute or chronic, without hemorrhage or perforation: Secondary | ICD-10-CM

## 2022-05-31 DIAGNOSIS — K2980 Duodenitis without bleeding: Secondary | ICD-10-CM

## 2022-05-31 DIAGNOSIS — K2970 Gastritis, unspecified, without bleeding: Secondary | ICD-10-CM

## 2022-07-15 ENCOUNTER — Ambulatory Visit: Payer: Medicaid Other | Admitting: Pharmacist

## 2022-07-15 DIAGNOSIS — D6859 Other primary thrombophilia: Secondary | ICD-10-CM | POA: Diagnosis present

## 2022-07-15 DIAGNOSIS — Z7901 Long term (current) use of anticoagulants: Secondary | ICD-10-CM | POA: Diagnosis not present

## 2022-07-15 LAB — POCT INR: INR: 3 (ref 2.0–3.0)
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
## 2022-07-15 NOTE — Progress Notes (Signed)
Evaluation and management procedures were performed by the Clinical Pharmacy Practitioner under my supervision and collaboration. I have reviewed the Practitioner's note and chart, and I agree with the management and plan as documented above. ° °

## 2022-07-15 NOTE — Patient Instructions (Signed)
Patient instructed to take medications as defined in the Anti-coagulation Track section of this encounter.  Patient instructed to take today's dose.  Patient instructed to take three (3) tablets of '5mg'$  once-daily at 6:00 pm on MONDAYS, [place] and FRIDAYS. All OTHER days, take only two-and-half (2 &1/2 tablets) of your '5mg'$  tablets.   Patient verbalized understanding of these instructions.

## 2022-07-23 ENCOUNTER — Other Ambulatory Visit: Payer: Self-pay | Admitting: Pharmacist

## 2022-07-23 DIAGNOSIS — D6859 Other primary thrombophilia: Secondary | ICD-10-CM

## 2022-07-23 DIAGNOSIS — Z7901 Long term (current) use of anticoagulants: Secondary | ICD-10-CM

## 2022-07-23 MED ORDER — WARFARIN SODIUM 5 MG PO TABS: ORAL_TABLET | ORAL | 3 refills | Status: DC

## 2022-07-26 ENCOUNTER — Other Ambulatory Visit: Payer: Self-pay | Admitting: Internal Medicine

## 2022-07-26 DIAGNOSIS — Z1231 Encounter for screening mammogram for malignant neoplasm of breast: Secondary | ICD-10-CM

## 2022-07-29 ENCOUNTER — Other Ambulatory Visit: Payer: Self-pay

## 2022-07-29 ENCOUNTER — Encounter (HOSPITAL_BASED_OUTPATIENT_CLINIC_OR_DEPARTMENT_OTHER): Payer: Self-pay

## 2022-07-29 ENCOUNTER — Emergency Department (HOSPITAL_BASED_OUTPATIENT_CLINIC_OR_DEPARTMENT_OTHER)
Admission: EM | Admit: 2022-07-29 | Discharge: 2022-07-29 | Disposition: A | Discharge status: Home / Self Care | Payer: Medicaid Other | Attending: Emergency Medicine | Admitting: Emergency Medicine

## 2022-07-29 DIAGNOSIS — W5501XA Bitten by cat, initial encounter: Secondary | ICD-10-CM | POA: Insufficient documentation

## 2022-07-29 DIAGNOSIS — Z79899 Other long term (current) drug therapy: Secondary | ICD-10-CM | POA: Diagnosis not present

## 2022-07-29 DIAGNOSIS — S61230A Puncture wound without foreign body of right index finger without damage to nail, initial encounter: Secondary | ICD-10-CM | POA: Diagnosis not present

## 2022-07-29 DIAGNOSIS — Z203 Contact with and (suspected) exposure to rabies: Secondary | ICD-10-CM | POA: Diagnosis not present

## 2022-07-29 DIAGNOSIS — Z2914 Encounter for prophylactic rabies immune globin: Secondary | ICD-10-CM | POA: Insufficient documentation

## 2022-07-29 DIAGNOSIS — Z23 Encounter for immunization: Secondary | ICD-10-CM | POA: Diagnosis not present

## 2022-07-29 DIAGNOSIS — S6991XA Unspecified injury of right wrist, hand and finger(s), initial encounter: Secondary | ICD-10-CM | POA: Diagnosis present

## 2022-07-29 MED ORDER — TETANUS-DIPHTH-ACELL PERTUSSIS 5-2.5-18.5 LF-MCG/0.5 IM SUSY
0.5000 mL | PREFILLED_SYRINGE | Freq: Once | INTRAMUSCULAR | Status: AC
  Administered 2022-07-29: 0.5 mL via INTRAMUSCULAR
  Filled 2022-07-29: qty 0.5

## 2022-07-29 MED ORDER — RABIES VACCINE, PCEC IM SUSR
1.0000 mL | Freq: Once | INTRAMUSCULAR | Status: AC
  Administered 2022-07-29: 1 mL via INTRAMUSCULAR
  Filled 2022-07-29 (×2): qty 1

## 2022-07-29 MED ORDER — RABIES IMMUNE GLOBULIN 150 UNIT/ML IM INJ
20.0000 [IU]/kg | INJECTION | Freq: Once | INTRAMUSCULAR | Status: AC
  Administered 2022-07-29: 2025 [IU]
  Filled 2022-07-29: qty 14

## 2022-07-29 MED ORDER — AMOXICILLIN-POT CLAVULANATE 875-125 MG PO TABS: 1.0000 | ORAL_TABLET | Freq: Two times a day (BID) | ORAL | 0 refills | Status: DC

## 2022-07-29 NOTE — ED Triage Notes (Signed)
Pt states she was bitten by a stray cat yesterday.   Tiny place noted at rt. Index finger at cuticle

## 2022-07-29 NOTE — ED Provider Notes (Signed)
[facility] EMERGENCY DEPT Provider Note   CSN: [id number] Arrival date & time: 07/29/22  1441     History  Chief Complaint  Patient presents with   Animal Bite    Michele Gillespie is a 46 y.o. female.   Animal Bite    46 year old female presenting to the emergency department after a cat bite to the right index finger at the cuticle.  The patient states that she was bit by a stray cat.  The cat is not in possession.  She does not know if the cat has rabies and is up-to-date on shots.  Her tetanus is not up-to-date.  She denies any other injuries or complaints.  Home Medications Prior to Admission medications   Medication Sig Start Date End Date Taking? Authorizing Provider  amoxicillin-clavulanate (AUGMENTIN) 875-125 MG tablet Take 1 tablet by mouth every 12 (twelve) hours. 07/29/22  Yes Regan Lemming, MD  acetaminophen (TYLENOL) 500 MG tablet Take 1,000 mg by mouth every 6 (six) hours as needed for moderate pain or headache. Patient not taking: Reported on 01/15/2022    [provider]  albuterol (PROVENTIL HFA) 108 (90 Base) MCG/ACT inhaler INHALE 2 PUFFS INTO THE LUNGS EVERY 6 HOURS AS NEEDED FOR WHEEZING. FOR SHORTNESS OF BREATH 02/12/21   Jose Persia, MD  amLODipine (NORVASC) 5 MG tablet Take 1 tablet (5 mg total) by mouth daily. 09/26/21   Jose Persia, MD  ARIPiprazole (ABILIFY) 10 MG tablet Take 1 tablet (10 mg total) by mouth daily. 05/21/21   Patrecia Pour, NP  citalopram (CELEXA) 10 MG tablet Take 1 tablet (10 mg total) by mouth daily. 09/26/21 09/26/22  Jose Persia, MD  divalproex (DEPAKOTE) 500 MG DR tablet Take 1 tablet (500 mg total) by mouth 2 (two) times daily. 09/26/21 09/26/22  Jose Persia, MD  fluticasone (FLONASE) 50 MCG/ACT nasal spray Place 1 spray into both nostrils daily. 12/21/21   Teodora Medici, FNP  linaclotide [facility]) 72 MCG capsule Take 1 capsule (72 mcg total) by mouth daily before breakfast. 09/26/21   Jose Persia, MD  loratadine (CLARITIN) 10 MG tablet Take 1 tablet (10 mg total) by mouth daily. 02/08/22   Palumbo, April, MD  methocarbamol (ROBAXIN) 500 MG tablet Take 1 tablet (500 mg total) by mouth 2 (two) times daily. 01/09/22   Sponseller, Eugene Garnet R, PA-C  omeprazole (PRILOSEC) 40 MG capsule TAKE 1 CAPSULE BY MOUTH TWICE A DAY BEFORE A MEAL 06/04/22   Thornton Park, MD  predniSONE (DELTASONE) 50 MG tablet One tablet a day for 5 days 04/09/22   Fransico Meadow, PA-C  warfarin (COUMADIN) 5 MG tablet Take three (3) tablets daily on Mondays, Wednesdays and Fridays. All OTHER days, take only 2 & 1/2 tablets on Sundays, Tuesdays, Thursdays and Saturdays. 07/23/22   Pennie Banter, RPH-CPP      Allergies    Patient has no known allergies.    Review of Systems   Review of Systems  Skin:  Positive for wound.  All other systems reviewed and are negative.   Physical Exam Updated Vital Signs BP 138/88 (BP Location: Right Arm)   Pulse 80   Temp 98.6 F (37 C) (Oral)   Resp 15   Ht '5\' 4"'$  (1.626 m)   Wt 102.1 kg   SpO2 100%   BMI 38.62 kg/m  Physical Exam Vitals and nursing note reviewed.  Constitutional:      General: She is not in acute distress. HENT:  Head: Normocephalic and atraumatic.  Eyes:     Conjunctiva/sclera: Conjunctivae normal.     Pupils: Pupils are equal, round, and reactive to light.  Cardiovascular:     Rate and Rhythm: Normal rate and regular rhythm.  Pulmonary:     Effort: Pulmonary effort is normal. No respiratory distress.  Abdominal:     General: There is no distension.     Tenderness: There is no guarding.  Musculoskeletal:        General: No deformity or signs of injury.     Cervical back: Neck supple.     Comments: Small puncture wound to the right index finger at the cuticle, no surrounding erythema, no bleeding  Skin:    Findings: No lesion or rash.  Neurological:     General: No focal deficit present.     Mental Status: She is alert. Mental status  is at baseline.     ED Results / Procedures / Treatments   Labs (all labs ordered are listed, but only abnormal results are displayed) Labs Reviewed - No data to display  EKG None  Radiology No results found.  Procedures Procedures    Medications Ordered in ED Medications  rabies immune globulin (HYPERRAB/KEDRAB) injection 2,025 Units (2,025 Units Infiltration Given 07/29/22 1718)  rabies vaccine (RABAVERT) injection 1 mL (1 mL Intramuscular Given 07/29/22 1713)  Tdap (BOOSTRIX) injection 0.5 mL (0.5 mLs Intramuscular Given 07/29/22 1712)    ED Course/ Medical Decision Making/ A&P                           Medical Decision Making Risk Prescription drug management.     46 year old female presenting to the emergency department after a cat bite to the right index finger at the cuticle.  The patient states that she was bit by a stray cat.  The cat is not in possession.  She does not know if the cat has rabies and is up-to-date on shots.  Her tetanus is not up-to-date.  She denies any other injuries or complaints.  On arrival, patient was vitally stable.  Bit by a stray cat who is not in custody.  Unclear vaccination status rabies status.  Patient will be administered tetanus, rabies immunoglobulin and vaccine.  Patient advised to follow-up for recurrent rabies vaccine administration.  We will start the patient on Augmentin and have her follow-up for recurrent shots.  Stable for discharge.  Provided with infectious return precautions.   Final Clinical Impression(s) / ED Diagnoses Final diagnoses:  Cat bite, initial encounter    Rx / DC Orders ED Discharge Orders          Ordered    amoxicillin-clavulanate (AUGMENTIN) 875-125 MG tablet  Every 12 hours        07/29/22 1758              Regan Lemming, MD 07/29/22 1815

## 2022-07-29 NOTE — Discharge Instructions (Addendum)
Your will need to return the the Emergency Department on days 3, 7, 14, and 28. Please return in 3 days for your next rabies shot. Watch for signs of infection to include redness, swelling, purulent discharge.

## 2022-08-01 ENCOUNTER — Other Ambulatory Visit: Payer: Self-pay

## 2022-08-01 ENCOUNTER — Encounter (HOSPITAL_BASED_OUTPATIENT_CLINIC_OR_DEPARTMENT_OTHER): Payer: Self-pay | Admitting: Emergency Medicine

## 2022-08-01 ENCOUNTER — Emergency Department (HOSPITAL_BASED_OUTPATIENT_CLINIC_OR_DEPARTMENT_OTHER)
Admission: EM | Admit: 2022-08-01 | Discharge: 2022-08-01 | Disposition: A | Discharge status: Home / Self Care | Payer: Medicaid Other | Attending: Emergency Medicine | Admitting: Emergency Medicine

## 2022-08-01 DIAGNOSIS — Z23 Encounter for immunization: Secondary | ICD-10-CM | POA: Insufficient documentation

## 2022-08-01 DIAGNOSIS — T50Z95A Adverse effect of other vaccines and biological substances, initial encounter: Secondary | ICD-10-CM | POA: Diagnosis not present

## 2022-08-01 DIAGNOSIS — T887XXA Unspecified adverse effect of drug or medicament, initial encounter: Secondary | ICD-10-CM | POA: Insufficient documentation

## 2022-08-01 DIAGNOSIS — Z203 Contact with and (suspected) exposure to rabies: Secondary | ICD-10-CM | POA: Insufficient documentation

## 2022-08-01 MED ORDER — RABIES VACCINE, PCEC IM SUSR
1.0000 mL | Freq: Once | INTRAMUSCULAR | Status: AC
  Administered 2022-08-01: 1 mL via INTRAMUSCULAR
  Filled 2022-08-01: qty 1

## 2022-08-01 NOTE — ED Provider Notes (Signed)
[facility] EMERGENCY DEPT Provider Note   CSN: [id number] Arrival date & time: 08/01/22  0745     History  Chief Complaint  Patient presents with   Rabies Injection    Michele Gillespie is a 46 y.o. female.  HPI   46 year old female presents today for follow-up after rabies vaccine and cat bite on Sunday, October 30.  Patient had a bite to her right finger.  She is not having any redness, swelling, pus, or any ongoing symptoms with this.  She received immunoglobulin and vaccine.  She has some tenderness in the left upper arm and some induration.  Denies any redness, streaking, fever, chills.  While she has been doing well.  She presents here after working last night.  She usually takes her blood pressure medications later in the day.  Home Medications Prior to Admission medications   Medication Sig Start Date End Date Taking? Authorizing Provider  acetaminophen (TYLENOL) 500 MG tablet Take 1,000 mg by mouth every 6 (six) hours as needed for moderate pain or headache. Patient not taking: Reported on 01/15/2022    [provider]  albuterol (PROVENTIL HFA) 108 (90 Base) MCG/ACT inhaler INHALE 2 PUFFS INTO THE LUNGS EVERY 6 HOURS AS NEEDED FOR WHEEZING. FOR SHORTNESS OF BREATH 02/12/21   Jose Persia, MD  amLODipine (NORVASC) 5 MG tablet Take 1 tablet (5 mg total) by mouth daily. 09/26/21   Jose Persia, MD  amoxicillin-clavulanate (AUGMENTIN) 875-125 MG tablet Take 1 tablet by mouth every 12 (twelve) hours. 07/29/22   Regan Lemming, MD  ARIPiprazole (ABILIFY) 10 MG tablet Take 1 tablet (10 mg total) by mouth daily. 05/21/21   Patrecia Pour, NP  citalopram (CELEXA) 10 MG tablet Take 1 tablet (10 mg total) by mouth daily. 09/26/21 09/26/22  Jose Persia, MD  divalproex (DEPAKOTE) 500 MG DR tablet Take 1 tablet (500 mg total) by mouth 2 (two) times daily. 09/26/21 09/26/22  Jose Persia, MD  fluticasone (FLONASE) 50 MCG/ACT nasal spray Place 1 spray into  both nostrils daily. 12/21/21   Teodora Medici, FNP  linaclotide [facility]) 72 MCG capsule Take 1 capsule (72 mcg total) by mouth daily before breakfast. 09/26/21   Jose Persia, MD  loratadine (CLARITIN) 10 MG tablet Take 1 tablet (10 mg total) by mouth daily. 02/08/22   Palumbo, April, MD  methocarbamol (ROBAXIN) 500 MG tablet Take 1 tablet (500 mg total) by mouth 2 (two) times daily. 01/09/22   Sponseller, Eugene Garnet R, PA-C  omeprazole (PRILOSEC) 40 MG capsule TAKE 1 CAPSULE BY MOUTH TWICE A DAY BEFORE A MEAL 06/04/22   Thornton Park, MD  predniSONE (DELTASONE) 50 MG tablet One tablet a day for 5 days 04/09/22   Fransico Meadow, PA-C  warfarin (COUMADIN) 5 MG tablet Take three (3) tablets daily on Mondays, Wednesdays and Fridays. All OTHER days, take only 2 & 1/2 tablets on Sundays, Tuesdays, Thursdays and Saturdays. 07/23/22   Pennie Banter, RPH-CPP      Allergies    Patient has no known allergies.    Review of Systems   Review of Systems  Physical Exam Updated Vital Signs BP (!) 126/93   Pulse 82   Temp 98.5 F (36.9 C) (Oral)   Resp 18   Ht 1.626 m ('5\' 4"'$ )   Wt 102.1 kg   SpO2 100%   BMI 38.62 kg/m  Physical Exam Vitals and nursing note reviewed.  Constitutional:      General: She is not in acute distress.  Appearance: Normal appearance.  HENT:     Head: Normocephalic and atraumatic.     Right Ear: External ear normal.     Left Ear: External ear normal.     Nose: No congestion.     Mouth/Throat:     Pharynx: Oropharynx is clear.  Eyes:     Pupils: Pupils are equal, round, and reactive to light.  Cardiovascular:     Rate and Rhythm: Normal rate.  Pulmonary:     Effort: Pulmonary effort is normal.  Musculoskeletal:     Cervical back: Normal range of motion.     Comments: Right hand with no signs of trauma, redness, Left upper arm with a indurated area without redness, mass palpated but no fluctuance  Skin:    General: Skin is warm and dry.     Capillary Refill:  Capillary refill takes less than 2 seconds.  Neurological:     General: No focal deficit present.     Mental Status: She is alert.  Psychiatric:        Mood and Affect: Mood normal.        Behavior: Behavior normal.     ED Results / Procedures / Treatments   Labs (all labs ordered are listed, but only abnormal results are displayed) Labs Reviewed - No data to display  EKG None  Radiology No results found.  Procedures Procedures    Medications Ordered in ED Medications  rabies vaccine (RABAVERT) injection 1 mL (has no administration in time range)    ED Course/ Medical Decision Making/ A&P                           Medical Decision Making 46 year old female presents today for second rabies vaccine and some swelling in her arm at site of original injection I suspect the injection site is a localized injection reaction with some induration.  There is no evidence of infection.  The area is not particularly tender.  She is advised regarding turn precautions and symptomatic management Patient will receive her second rabies vaccine today Patient advised to follow-up at [facility] urgent care for subsequent rabies  Risk Prescription drug management.           Final Clinical Impression(s) / ED Diagnoses Final diagnoses:  Adverse effect of vaccine, initial encounter  Rabies contact    Rx / DC Orders ED Discharge Orders     None         Pattricia Boss, MD 08/01/22 [phone]

## 2022-08-01 NOTE — Discharge Instructions (Signed)
Please follow up at the [facility] Urgent care to complete your rabies series. If you have increased redness, swelling, red streaking, or fever, please return for reevaluation of your arm or hand. Please continue warm compresses and gentle massage to left upper arm.  May use ibuprofen if needed

## 2022-08-01 NOTE — ED Triage Notes (Signed)
Pt returns for rabies injection follow up

## 2022-08-05 ENCOUNTER — Ambulatory Visit: Payer: Medicaid Other | Admitting: Student

## 2022-08-05 ENCOUNTER — Encounter (HOSPITAL_COMMUNITY): Payer: Self-pay | Admitting: *Deleted

## 2022-08-05 ENCOUNTER — Other Ambulatory Visit: Payer: Self-pay

## 2022-08-05 ENCOUNTER — Ambulatory Visit (HOSPITAL_COMMUNITY)
Admission: EM | Admit: 2022-08-05 | Discharge: 2022-08-05 | Disposition: A | Discharge status: Home / Self Care | Payer: Medicaid Other | Attending: Internal Medicine | Admitting: Internal Medicine

## 2022-08-05 ENCOUNTER — Encounter: Payer: Self-pay | Admitting: Student

## 2022-08-05 VITALS — BP 133/91 | HR 72 | Temp 98.2°F | Ht 64.0 in | Wt 220.8 lb

## 2022-08-05 DIAGNOSIS — F17210 Nicotine dependence, cigarettes, uncomplicated: Secondary | ICD-10-CM

## 2022-08-05 DIAGNOSIS — Z203 Contact with and (suspected) exposure to rabies: Secondary | ICD-10-CM | POA: Diagnosis not present

## 2022-08-05 DIAGNOSIS — N644 Mastodynia: Secondary | ICD-10-CM | POA: Insufficient documentation

## 2022-08-05 DIAGNOSIS — I10 Essential (primary) hypertension: Secondary | ICD-10-CM

## 2022-08-05 DIAGNOSIS — Z23 Encounter for immunization: Secondary | ICD-10-CM | POA: Diagnosis not present

## 2022-08-05 MED ORDER — RABIES VACCINE, PCEC IM SUSR
INTRAMUSCULAR | Status: AC
  Filled 2022-08-05: qty 1

## 2022-08-05 MED ORDER — RABIES VACCINE, PCEC IM SUSR
1.0000 mL | Freq: Once | INTRAMUSCULAR | Status: AC
  Administered 2022-08-05: 1 mL via INTRAMUSCULAR

## 2022-08-05 NOTE — Patient Instructions (Addendum)
Thank you, Ms.Kayleanna L Deneault for allowing [place] to provide your care today.   Left Breast Pain -I have sent diagnostic mammogram and breast ultrasound for your left breast pain.   Blood Pressure -Please continue with amlodipine 5 mg daily.   Rabies -We are unable to offer the rabies vaccine. Please go to urgent care to continue your rabies vaccination.   I have ordered the following medication/changed the following medications:   Stop the following medications: There are no discontinued medications.   Start the following medications: No orders of the defined types were placed in this encounter.    Follow up:  1 month      Should you have any questions or concerns please call the internal medicine clinic at [phone].    Angelique Blonder, D.O. [place]
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
## 2022-08-05 NOTE — Assessment & Plan Note (Signed)
BP Readings from Last 3 Encounters:  08/05/22 (!) 133/91  08/01/22 (!) 126/93  07/29/22 138/88   BP slightly elevated today. She is on amlodipine 5 mg daily. Tolerating medication with good adherence. Reassess at next visit, if persistently elevated, consider increasing amlodipine dose.   Plan -Continue amlodipine 5 mg daily

## 2022-08-05 NOTE — ED Triage Notes (Signed)
Pt presents today for 2nd rabies injection.

## 2022-08-05 NOTE — Assessment & Plan Note (Addendum)
Patient reports ongoing left breast pain for at least 2 years. Tenderness localized to the left inframammary fold.  Denies any swelling, erythema, nipple discharge or bleeding.  No recent trauma or injury. She does not think it is from wearing a bra. Pain comes and goes. She is postmenopausal. Had similar episode a few years ago, diagnostic mammography and ultrasound did not show any focal abnormality over the area of concern. Last screening mammography was 07/2021 which was negative. Exam today, localized tenderness at left inframammary fold with no rash, swelling, bleeding or discharge noted.   Plan -Diagnostic mammogram and left breast ultrasound

## 2022-08-09 NOTE — Progress Notes (Signed)
Internal Medicine Clinic Attending  I saw and evaluated the patient.  I personally confirmed the key portions of the history and exam documented by the resident  and I reviewed pertinent patient test results.  The assessment, diagnosis, and plan were formulated together and I agree with the documentation in the resident's note.  

## 2022-08-12 ENCOUNTER — Ambulatory Visit: Payer: Medicaid Other

## 2022-08-12 ENCOUNTER — Ambulatory Visit (HOSPITAL_COMMUNITY)
Admission: EM | Admit: 2022-08-12 | Discharge: 2022-08-12 | Disposition: A | Discharge status: Home / Self Care | Payer: Medicaid Other | Attending: Internal Medicine | Admitting: Internal Medicine

## 2022-08-12 DIAGNOSIS — Z23 Encounter for immunization: Secondary | ICD-10-CM

## 2022-08-12 DIAGNOSIS — Z203 Contact with and (suspected) exposure to rabies: Secondary | ICD-10-CM | POA: Diagnosis not present

## 2022-08-12 MED ORDER — RABIES VACCINE, PCEC IM SUSR
1.0000 mL | Freq: Once | INTRAMUSCULAR | Status: AC
  Administered 2022-08-12: 1 mL via INTRAMUSCULAR

## 2022-08-12 MED ORDER — RABIES VACCINE, PCEC IM SUSR
INTRAMUSCULAR | Status: AC
  Filled 2022-08-12: qty 1

## 2022-08-12 NOTE — ED Notes (Signed)
Pt is here for 3rd rabies shot in the RA. Pt tolerated rabies vaccine

## 2022-08-12 NOTE — ED Triage Notes (Signed)
Pt is here for 3rd rabies vaccine

## 2022-08-18 ENCOUNTER — Other Ambulatory Visit: Payer: Self-pay | Admitting: Pharmacist

## 2022-08-18 DIAGNOSIS — D6859 Other primary thrombophilia: Secondary | ICD-10-CM

## 2022-08-18 DIAGNOSIS — Z7901 Long term (current) use of anticoagulants: Secondary | ICD-10-CM

## 2022-08-18 MED ORDER — WARFARIN SODIUM 5 MG PO TABS: ORAL_TABLET | ORAL | 3 refills | Status: DC

## 2022-08-20 ENCOUNTER — Other Ambulatory Visit: Payer: Self-pay | Admitting: Pharmacist

## 2022-08-20 DIAGNOSIS — Z7901 Long term (current) use of anticoagulants: Secondary | ICD-10-CM

## 2022-08-20 DIAGNOSIS — D6859 Other primary thrombophilia: Secondary | ICD-10-CM

## 2022-08-20 MED ORDER — WARFARIN SODIUM 10 MG PO TABS: ORAL_TABLET | ORAL | 2 refills | Status: DC

## 2022-08-26 ENCOUNTER — Ambulatory Visit (HOSPITAL_COMMUNITY)
Admission: EM | Admit: 2022-08-26 | Discharge: 2022-08-26 | Disposition: A | Discharge status: Home / Self Care | Payer: Medicaid Other | Attending: Internal Medicine | Admitting: Internal Medicine

## 2022-08-26 DIAGNOSIS — Z203 Contact with and (suspected) exposure to rabies: Secondary | ICD-10-CM | POA: Diagnosis not present

## 2022-08-26 DIAGNOSIS — Z23 Encounter for immunization: Secondary | ICD-10-CM

## 2022-08-26 MED ORDER — RABIES VACCINE, PCEC IM SUSR
1.0000 mL | Freq: Once | INTRAMUSCULAR | Status: AC
  Administered 2022-08-26: 1 mL via INTRAMUSCULAR

## 2022-08-26 MED ORDER — RABIES VACCINE, PCEC IM SUSR
INTRAMUSCULAR | Status: AC
  Filled 2022-08-26: qty 1

## 2022-08-26 NOTE — ED Triage Notes (Signed)
Pt here for 4th rabies injection. Denies any problems or concerns at this time.

## 2022-09-02 ENCOUNTER — Encounter: Payer: Medicaid Other | Admitting: Internal Medicine
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
## 2022-10-08 ENCOUNTER — Ambulatory Visit
Admission: RE | Admit: 2022-10-08 | Discharge: 2022-10-08 | Disposition: A | Discharge status: Home / Self Care | Payer: Medicaid Other | Source: Ambulatory Visit | Attending: Internal Medicine | Admitting: Internal Medicine

## 2022-10-08 ENCOUNTER — Ambulatory Visit: Payer: Medicaid Other

## 2022-10-08 ENCOUNTER — Ambulatory Visit: Payer: Medicaid Other | Admitting: Pharmacist

## 2022-10-08 DIAGNOSIS — D6859 Other primary thrombophilia: Secondary | ICD-10-CM | POA: Diagnosis present

## 2022-10-08 DIAGNOSIS — Z7901 Long term (current) use of anticoagulants: Secondary | ICD-10-CM | POA: Diagnosis not present

## 2022-10-08 DIAGNOSIS — N644 Mastodynia: Secondary | ICD-10-CM

## 2022-10-08 LAB — POCT INR: INR: 2.1 (ref 2.0–3.0)

## 2022-10-08 NOTE — Patient Instructions (Signed)
Patient instructed to take medications as defined in the Anti-coagulation Track section of this encounter.  Patient instructed to take today's dose.  Patient instructed to take one-and-one-half (1 & 1/2) of your 10 milligram strength, white, warfarin tablets by mouth, once-daily. Patient verbalized understanding of these instructions.
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
## 2022-10-15 ENCOUNTER — Encounter: Payer: Medicaid Other | Admitting: Student

## 2022-10-21 ENCOUNTER — Emergency Department (HOSPITAL_COMMUNITY): Payer: Medicaid Other

## 2022-10-21 ENCOUNTER — Encounter (HOSPITAL_COMMUNITY): Payer: Self-pay

## 2022-10-21 ENCOUNTER — Emergency Department (HOSPITAL_BASED_OUTPATIENT_CLINIC_OR_DEPARTMENT_OTHER): Payer: Medicaid Other

## 2022-10-21 ENCOUNTER — Emergency Department (HOSPITAL_COMMUNITY)
Admission: EM | Admit: 2022-10-21 | Discharge: 2022-10-21 | Disposition: A | Discharge status: Home / Self Care | Payer: Medicaid Other | Attending: Emergency Medicine | Admitting: Emergency Medicine

## 2022-10-21 ENCOUNTER — Other Ambulatory Visit: Payer: Self-pay

## 2022-10-21 DIAGNOSIS — R52 Pain, unspecified: Secondary | ICD-10-CM | POA: Diagnosis not present

## 2022-10-21 DIAGNOSIS — I10 Essential (primary) hypertension: Secondary | ICD-10-CM | POA: Diagnosis not present

## 2022-10-21 DIAGNOSIS — Z1152 Encounter for screening for COVID-19: Secondary | ICD-10-CM | POA: Insufficient documentation

## 2022-10-21 DIAGNOSIS — Z7901 Long term (current) use of anticoagulants: Secondary | ICD-10-CM | POA: Insufficient documentation

## 2022-10-21 DIAGNOSIS — R072 Precordial pain: Secondary | ICD-10-CM | POA: Diagnosis present

## 2022-10-21 DIAGNOSIS — Z79899 Other long term (current) drug therapy: Secondary | ICD-10-CM | POA: Insufficient documentation

## 2022-10-21 DIAGNOSIS — R0789 Other chest pain: Secondary | ICD-10-CM | POA: Insufficient documentation

## 2022-10-21 LAB — RESP PANEL BY RT-PCR (RSV, FLU A&B, COVID)  RVPGX2
Influenza A by PCR: NEGATIVE
Influenza B by PCR: NEGATIVE
Resp Syncytial Virus by PCR: NEGATIVE
SARS Coronavirus 2 by RT PCR: NEGATIVE

## 2022-10-21 LAB — COMPREHENSIVE METABOLIC PANEL
ALT: 22 U/L (ref 0–44)
AST: 20 U/L (ref 15–41)
Albumin: 3.4 g/dL — ABNORMAL LOW (ref 3.5–5.0)
Alkaline Phosphatase: 52 U/L (ref 38–126)
Anion gap: 8 (ref 5–15)
BUN: 13 mg/dL (ref 6–20)
CO2: 24 mmol/L (ref 22–32)
Calcium: 8.8 mg/dL — ABNORMAL LOW (ref 8.9–10.3)
Chloride: 109 mmol/L (ref 98–111)
Creatinine, Ser: 0.94 mg/dL (ref 0.44–1.00)
GFR, Estimated: >60 mL/min (ref >60)
Glucose, Bld: 129 mg/dL — ABNORMAL HIGH (ref 70–99)
Potassium: 3.8 mmol/L (ref 3.5–5.1)
Sodium: 141 mmol/L (ref 135–145)
Total Bilirubin: 0.2 mg/dL — ABNORMAL LOW (ref 0.3–1.2)
Total Protein: 6.3 g/dL — ABNORMAL LOW (ref 6.5–8.1)

## 2022-10-21 LAB — PROTIME-INR
INR: 2.3 — ABNORMAL HIGH (ref 0.8–1.2)
Prothrombin Time: 25.3 seconds — ABNORMAL HIGH (ref 11.4–15.2)

## 2022-10-21 LAB — TROPONIN I (HIGH SENSITIVITY)
Troponin I (High Sensitivity): 3 ng/L (ref <18)
Troponin I (High Sensitivity): 3 ng/L (ref <18)

## 2022-10-21 LAB — I-STAT BETA HCG BLOOD, ED (MC, WL, AP ONLY): I-stat hCG, quantitative: 5.2 m[IU]/mL — ABNORMAL HIGH (ref <5)

## 2022-10-21 LAB — CBC
HCT: 43.8 % (ref 36.0–46.0)
Hemoglobin: 13.9 g/dL (ref 12.0–15.0)
MCH: 25.6 pg — ABNORMAL LOW (ref 26.0–34.0)
MCHC: 31.7 g/dL (ref 30.0–36.0)
MCV: 80.7 fL (ref 80.0–100.0)
Platelets: 188 10*3/uL (ref 150–400)
RBC: 5.43 MIL/uL — ABNORMAL HIGH (ref 3.87–5.11)
RDW: 14.6 % (ref 11.5–15.5)
WBC: 3.8 10*3/uL — ABNORMAL LOW (ref 4.0–10.5)
nRBC: 0 % (ref 0.0–0.2)

## 2022-10-21 LAB — HCG, SERUM, QUALITATIVE: Preg, Serum: NEGATIVE

## 2022-10-21 MED ORDER — ALUM & MAG HYDROXIDE-SIMETH 200-200-20 MG/5ML PO SUSP
30.0000 mL | Freq: Once | ORAL | Status: AC
  Administered 2022-10-21: 30 mL via ORAL
  Filled 2022-10-21: qty 30

## 2022-10-21 MED ORDER — IOHEXOL 350 MG/ML SOLN
75.0000 mL | Freq: Once | INTRAVENOUS | Status: AC | PRN
  Administered 2022-10-21: 75 mL via INTRAVENOUS

## 2022-10-21 MED ORDER — SODIUM CHLORIDE 0.9 % IV BOLUS
1000.0000 mL | Freq: Once | INTRAVENOUS | Status: AC
  Administered 2022-10-21: 1000 mL via INTRAVENOUS

## 2022-10-21 MED ORDER — LIDOCAINE VISCOUS HCL 2 % MT SOLN
15.0000 mL | Freq: Once | OROMUCOSAL | Status: AC
  Administered 2022-10-21: 15 mL via ORAL
  Filled 2022-10-21: qty 15

## 2022-10-21 MED ORDER — ONDANSETRON HCL 4 MG/2ML IJ SOLN
4.0000 mg | Freq: Once | INTRAMUSCULAR | Status: AC
  Administered 2022-10-21: 4 mg via INTRAVENOUS
  Filled 2022-10-21: qty 2

## 2022-10-21 NOTE — Discharge Instructions (Signed)
Your history, exam, workup today were overall reassuring.  We did the small hiatal hernia on your CT scan after and showed no evidence of blood clot or other concerning findings.  Your heart enzymes were reassuring and your vital signs were also reassuring.  I suspect your symptoms may be GI related as the GI cocktail help with your discomfort.  You proved stability for over 10 hours and passed a p.o. challenge.  We feel you are safe for discharge home.  Please consider following up with GI as an outpatient and general surgery for the hiatal hernia.  Please rest and stay hydrated.  If any symptoms change or worsen acutely, please return to the nearest Emergency Department.

## 2022-10-21 NOTE — Progress Notes (Signed)
LLE venous duplex has been completed.  Preliminary results given to Dr. Sherry Ruffing.   Results can be found under chart review under CV PROC. 10/21/2022 3:59 PM Juleen Sorrels RVT, RDMS

## 2022-10-21 NOTE — ED Provider Triage Note (Signed)
Emergency Medicine Provider Triage Evaluation Note  Michele Gillespie , a 47 y.o. female  was evaluated in triage.  Pt complains of Chest pain - hx of PE but on warfarin and state she is compliant. Some nausea but no vomiting. Feels dizzy/LH.  Review of Systems  Positive: CP Negative: Fever   Physical Exam  BP 121/88 (BP Location: Right Arm)   Pulse (!) 101   Temp 98.6 F (37 C) (Oral)   Resp 18   Ht '5\' 4"'$  (1.626 m)   Wt 99.8 kg   SpO2 98%   BMI 37.76 kg/m  Gen:   Awake, no distress   Resp:  Normal effort  MSK:   Moves extremities without difficulty  Other:    Medical Decision Making  Medically screening exam initiated at 3:58 AM.  Appropriate orders placed.  Michele Gillespie was informed that the remainder of the evaluation will be completed by another provider, this initial triage assessment does not replace that evaluation, and the importance of remaining in the ED until their evaluation is complete.  Labs, workup started    Pati Gallo [place], [place] 10/21/22 [phone]

## 2022-10-21 NOTE — ED Provider Notes (Signed)
[facility] Provider Note   CSN: [id number] Arrival date & time: 10/21/22  0347     History  Chief Complaint  Patient presents with   Chest Pain    Michele Gillespie is a 47 y.o. female.  The history is provided by the patient and medical records. No language interpreter was used.  Chest Pain Pain location:  L chest and substernal area Pain quality: aching and pressure   Pain radiates to:  Does not radiate Pain severity:  Moderate Onset quality:  Gradual Duration:  4 days Timing:  Constant Progression:  Waxing and waning Chronicity:  New Worsened by:  Deep breathing Ineffective treatments:  None tried Associated symptoms: cough, fatigue, near-syncope, numbness (mild tingling in hands that are not present on exam) and shortness of breath   Associated symptoms: no abdominal pain, no altered mental status, no back pain, no fever, no headache, no lower extremity edema, no nausea, no palpitations, no vomiting and no weakness   Risk factors: prior DVT/PE        Home Medications Prior to Admission medications   Medication Sig Start Date End Date Taking? Authorizing Provider  acetaminophen (TYLENOL) 500 MG tablet Take 1,000 mg by mouth every 6 (six) hours as needed for moderate pain or headache. Patient not taking: Reported on 01/15/2022    [provider]  albuterol (PROVENTIL HFA) 108 (90 Base) MCG/ACT inhaler INHALE 2 PUFFS INTO THE LUNGS EVERY 6 HOURS AS NEEDED FOR WHEEZING. FOR SHORTNESS OF BREATH 02/12/21   Jose Persia, MD  amLODipine (NORVASC) 5 MG tablet Take 1 tablet (5 mg total) by mouth daily. 09/26/21   Jose Persia, MD  amoxicillin-clavulanate (AUGMENTIN) 875-125 MG tablet Take 1 tablet by mouth every 12 (twelve) hours. 07/29/22   Regan Lemming, MD  ARIPiprazole (ABILIFY) 10 MG tablet Take 1 tablet (10 mg total) by mouth daily. 05/21/21   Patrecia Pour, NP  citalopram (CELEXA) 10 MG tablet Take 1 tablet (10  mg total) by mouth daily. 09/26/21 09/26/22  Jose Persia, MD  divalproex (DEPAKOTE) 500 MG DR tablet Take 1 tablet (500 mg total) by mouth 2 (two) times daily. 09/26/21 09/26/22  Jose Persia, MD  fluticasone (FLONASE) 50 MCG/ACT nasal spray Place 1 spray into both nostrils daily. 12/21/21   Teodora Medici, FNP  linaclotide [facility]) 72 MCG capsule Take 1 capsule (72 mcg total) by mouth daily before breakfast. 09/26/21   Jose Persia, MD  loratadine (CLARITIN) 10 MG tablet Take 1 tablet (10 mg total) by mouth daily. 02/08/22   Palumbo, April, MD  methocarbamol (ROBAXIN) 500 MG tablet Take 1 tablet (500 mg total) by mouth 2 (two) times daily. 01/09/22   Sponseller, Eugene Garnet R, PA-C  omeprazole (PRILOSEC) 40 MG capsule TAKE 1 CAPSULE BY MOUTH TWICE A DAY BEFORE A MEAL 06/04/22   Thornton Park, MD  predniSONE (DELTASONE) 50 MG tablet One tablet a day for 5 days 04/09/22   Fransico Meadow, PA-C  warfarin (COUMADIN) 10 MG tablet Take one-and-one-half (1 & 1/2) tablets of your 10 milligram strength warfarin tablets on Sundays, Mondays, Wednesdays, Thursdays, and Saturdays. On Tuesdays and Fridays, take only one (1) tablet. 08/20/22   Pennie Banter, RPH-CPP      Allergies    Patient has no known allergies.    Review of Systems   Review of Systems  Constitutional:  Positive for fatigue. Negative for chills and fever.  HENT:  Negative for congestion.   Eyes:  Negative for visual disturbance.  Respiratory:  Positive for cough, chest tightness and shortness of breath. Negative for wheezing.   Cardiovascular:  Positive for chest pain and near-syncope. Negative for palpitations and leg swelling.  Gastrointestinal:  Negative for abdominal pain, constipation, diarrhea, nausea and vomiting.  Genitourinary:  Negative for dysuria, flank pain and frequency.  Musculoskeletal:  Negative for back pain, neck pain and neck stiffness.  Skin:  Negative for rash and wound.  Neurological:  Positive for  light-headedness and numbness (mild tingling in hands that are not present on exam). Negative for weakness and headaches.  Psychiatric/Behavioral:  Negative for agitation.   All other systems reviewed and are negative.   Physical Exam Updated Vital Signs BP 121/88 (BP Location: Right Arm)   Pulse (!) 101   Temp 98.6 F (37 C) (Oral)   Resp 18   Ht '5\' 4"'$  (1.626 m)   Wt 99.8 kg   SpO2 98%   BMI 37.76 kg/m  Physical Exam Vitals and nursing note reviewed.  Constitutional:      General: She is not in acute distress.    Appearance: She is well-developed. She is not ill-appearing, toxic-appearing or diaphoretic.  HENT:     Head: Normocephalic and atraumatic.  Eyes:     Conjunctiva/sclera: Conjunctivae normal.     Pupils: Pupils are equal, round, and reactive to light.  Cardiovascular:     Rate and Rhythm: Normal rate and regular rhythm.     Heart sounds: No murmur heard. Pulmonary:     Effort: Pulmonary effort is normal. No respiratory distress.     Breath sounds: Normal breath sounds. No decreased breath sounds, wheezing, rhonchi or rales.  Chest:     Chest wall: Tenderness present.  Abdominal:     Palpations: Abdomen is soft.     Tenderness: There is no abdominal tenderness.  Musculoskeletal:        General: No swelling.     Cervical back: Neck supple.     Right lower leg: No edema.     Left lower leg: No edema.  Skin:    General: Skin is warm and dry.     Capillary Refill: Capillary refill takes less than 2 seconds.     Coloration: Skin is not pale.     Findings: No ecchymosis or erythema.  Neurological:     General: No focal deficit present.     Mental Status: She is alert.  Psychiatric:        Mood and Affect: Mood normal.     ED Results / Procedures / Treatments   Labs (all labs ordered are listed, but only abnormal results are displayed) Labs Reviewed  PROTIME-INR - Abnormal; Notable for the following components:      Result Value   Prothrombin Time 25.3  (*)    INR 2.3 (*)    All other components within normal limits  CBC - Abnormal; Notable for the following components:   WBC 3.8 (*)    RBC 5.43 (*)    MCH 25.6 (*)    All other components within normal limits  COMPREHENSIVE METABOLIC PANEL - Abnormal; Notable for the following components:   Glucose, Bld 129 (*)    Calcium 8.8 (*)    Total Protein 6.3 (*)    Albumin 3.4 (*)    Total Bilirubin 0.2 (*)    All other components within normal limits  I-STAT BETA HCG BLOOD, ED ([facility], [facility], [facility] ONLY) - Abnormal; Notable for the following components:  I-stat hCG, quantitative 5.2 (*)    All other components within normal limits  RESP PANEL BY RT-PCR (RSV, FLU A&B, COVID)  RVPGX2  HCG, SERUM, QUALITATIVE  TROPONIN I (HIGH SENSITIVITY)  TROPONIN I (HIGH SENSITIVITY)    EKG EKG Interpretation  Date/Time:  Monday October 21 2022 03:58:30 EST Ventricular Rate:  85 PR Interval:  154 QRS Duration: 76 QT Interval:  386 QTC Calculation: 459 R Axis:   57 Text Interpretation: Normal sinus rhythm Normal ECG When compared with ECG of 19-Nov-2020 21:47, PREVIOUS ECG IS PRESENT when comapred top rior, similar appearnce overall. No sTEMI Confirmed by Antony Blackbird [phone]) on 10/21/2022 7:16:34 AM  Radiology VAS [place] LOWER EXTREMITY VENOUS (DVT) (ONLY [facility] & [facility])  Result Date: 10/21/2022  Lower Venous DVT Study Patient Name:  ADANNA ZUCKERMAN  Date of Exam:   10/21/2022 Medical Rec #: [id number]       Accession #:    [id number] Date of Birth: 1976-09-10       Patient Gender: F Patient Age:   4 years Exam Location:  [facility] Procedure:      VAS [place] LOWER EXTREMITY VENOUS (DVT) Referring Phys: Marda Stalker --------------------------------------------------------------------------------  Indications: Pain.  Risk Factors: PE (2006) DVT LUE (2018). Anticoagulation: Warfarin. Comparison Study: Previous exam on 05/21/20 was negative for DVT. Performing Technologist: Rogelia Rohrer RVT, RDMS  Examination  Guidelines: A complete evaluation includes B-mode imaging, spectral Doppler, color Doppler, and power Doppler as needed of all accessible portions of each vessel. Bilateral testing is considered an integral part of a complete examination. Limited examinations for reoccurring indications may be performed as noted. The reflux portion of the exam is performed with the patient in reverse Trendelenburg.  +-----+---------------+---------+-----------+----------+--------------+ RIGHTCompressibilityPhasicitySpontaneityPropertiesThrombus Aging +-----+---------------+---------+-----------+----------+--------------+ CFV  Full           Yes      Yes                                 +-----+---------------+---------+-----------+----------+--------------+   +---------+---------------+---------+-----------+----------+--------------+ LEFT     CompressibilityPhasicitySpontaneityPropertiesThrombus Aging +---------+---------------+---------+-----------+----------+--------------+ CFV      Full           Yes      Yes                                 +---------+---------------+---------+-----------+----------+--------------+ SFJ      Full                                                        +---------+---------------+---------+-----------+----------+--------------+ FV Prox  Full           Yes      Yes                                 +---------+---------------+---------+-----------+----------+--------------+ FV Mid   Full           Yes      Yes                                 +---------+---------------+---------+-----------+----------+--------------+ FV DistalFull  Yes      Yes                                 +---------+---------------+---------+-----------+----------+--------------+ PFV      Full                                                        +---------+---------------+---------+-----------+----------+--------------+ POP      Full           Yes      Yes                                  +---------+---------------+---------+-----------+----------+--------------+ PTV      Full                                                        +---------+---------------+---------+-----------+----------+--------------+ PERO     Full                                                        +---------+---------------+---------+-----------+----------+--------------+    Summary: RIGHT: - No evidence of common femoral vein obstruction.  LEFT: - There is no evidence of deep vein thrombosis in the lower extremity.  - No cystic structure found in the popliteal fossa.  *See table(s) above for measurements and observations. Electronically signed by Harold Barban MD on 10/21/2022 at 11:32:36 AM.    Final    CT Angio Chest PE W and/or Wo Contrast  Result Date: 10/21/2022 CLINICAL DATA:  Left-sided chest pain and bilateral hand numbness. History of lower extremity thrombus. EXAM: CT ANGIOGRAPHY CHEST WITH CONTRAST TECHNIQUE: Multidetector CT imaging of the chest was performed using the standard protocol during bolus administration of intravenous contrast. Multiplanar CT image reconstructions and MIPs were obtained to evaluate the vascular anatomy. The patient was injected and scanned twice due to malfunction of the IV cannula. RADIATION DOSE REDUCTION: This exam was performed according to the departmental dose-optimization program which includes automated exposure control, adjustment of the mA and/or kV according to patient size and/or use of iterative reconstruction technique. CONTRAST:  20m OMNIPAQUE IOHEXOL 350 MG/ML SOLN, 755mOMNIPAQUE IOHEXOL 350 MG/ML SOLN COMPARISON:  CTA chest dated 05/21/2020 FINDINGS: Cardiovascular: The study is high quality for the evaluation of pulmonary embolism. There are no filling defects in the central, lobar, segmental or subsegmental pulmonary artery branches to suggest acute pulmonary embolism. Great vessels are normal in course and caliber.  Normal heart size. No significant pericardial fluid/thickening. Mediastinum/Nodes: Imaged thyroid gland without nodules meeting criteria for imaging follow-up by size. Small hiatal hernia. No pathologically enlarged axillary, supraclavicular, mediastinal, or hilar lymph nodes. Lungs/Pleura: The central airways are patent. Unchanged 2 mm right upper lobe (11:41) and right middle lobe nodules (11:52), likely benign given stability over greater than 2 years. No follow-up imaging recommended. No focal consolidation. No pneumothorax. No pleural effusion. Upper abdomen:  Normal. Musculoskeletal: No acute or abnormal lytic or blastic osseous lesions. Review of the MIP images confirms the above findings. IMPRESSION: 1. No evidence of pulmonary embolism or other acute intrathoracic process. 2. Small hiatal hernia. Electronically Signed   By: Darrin Nipper M.D.   On: 10/21/2022 11:03   DG Chest 2 View  Result Date: 10/21/2022 CLINICAL DATA:  Chest pain. EXAM: CHEST - 2 VIEW COMPARISON:  11/19/2020. FINDINGS: The heart size and mediastinal contours are within normal limits. Mild atelectasis or scarring is present at the left lung base. No effusion or pneumothorax. No acute osseous abnormality. IMPRESSION: No active cardiopulmonary disease. Electronically Signed   By: Brett Fairy M.D.   On: 10/21/2022 04:37    Procedures Procedures    Medications Ordered in ED Medications  ondansetron (ZOFRAN) injection 4 mg (4 mg Intravenous Given 10/21/22 0933)  sodium chloride 0.9 % bolus 1,000 mL (0 mLs Intravenous Stopped 10/21/22 1323)  iohexol (OMNIPAQUE) 350 MG/ML injection 75 mL (75 mLs Intravenous Contrast Given 10/21/22 1035)  iohexol (OMNIPAQUE) 350 MG/ML injection 75 mL (75 mLs Intravenous Contrast Given 10/21/22 1044)  alum & mag hydroxide-simeth (MAALOX/MYLANTA) 200-200-20 MG/5ML suspension 30 mL (30 mLs Oral Given 10/21/22 1320)    And  lidocaine (XYLOCAINE) 2 % viscous mouth solution 15 mL (15 mLs Oral Given 10/21/22  1320)    ED Course/ Medical Decision Making/ A&P                             Medical Decision Making Amount and/or Complexity of Data Reviewed Labs: ordered. Radiology: ordered.  Risk OTC drugs. Prescription drug management.    Michele Gillespie is a 47 y.o. 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  Patient reports that she has had some bilateral hand tingling at times but is mainly concerned about the chest pain.  She reports she has had some nausea and lightheaded/dizzy spells with it.  Reports the pain is in her left chest and feels like a pressure and aching.  It is not exertional but is occasionally pleuritic.  She denies any pain in her arms or abdomen or back at this time.  Reports she felt that her left inner thigh was painful going down her leg and felt like "a vein was hurting".  She is unsure if this feels like her previous blood clots or not.  She reports no other fevers or chills but has had some mild congestion and cough.  Unclear if she has had any sick contacts.  Reports the pain waxes and wanes.  On exam, lungs were clear.  Chest was slightly tender to palpation.  Abdomen nontender.  No murmur.  Good pulses in extremities.  Left leg was slightly tender in the thigh but had intact pulses.  Leg was not edematous on my exam.  Exam otherwise unremarkable.  EKG appears similar to prior with no STEMI.  Given the patient's pleuritic chest discomfort and feeling that she has a painful vein in her leg, I am concerned about thromboembolic disease.  Will get ultrasound of the leg as well as a CT PE study as she is high risk.  Will trend her troponin and get a delta troponin and check her other labs.  Will get a viral swab given  the congestion and cough she has been  describing.  Anticipate reassessment after workup to determine disposition.   CT scan returned showing no evidence of pulmonary embolism.  No evidence of clot on lower extremity ultrasound.  COVID and flu negative but I suspect she may have a viral infection causing this cough.  Otherwise workup reassuring.  CT scan did show a small hiatal hernia which may have cause of her symptoms.  A GI cocktail resolved her discomfort and she is feeling well.  She passed a p.o. challenge and would like to go home.  Okay for follow-up with GI and she already takes Prilosec.  She will continue this.  She was also given information for general surgery for the hiatal hernia.  She agreed with plan of care and return precautions and was discharged in good condition.        Final Clinical Impression(s) / ED Diagnoses Final diagnoses:  Atypical chest pain    Rx / DC Orders ED Discharge Orders     None       Clinical Impression: 1. Atypical chest pain     Disposition: Discharge  Condition: Good  I have discussed the results, Dx and Tx plan with the pt(& family if present). He/she/they expressed understanding and agree(s) with the plan. Discharge instructions discussed at great length. Strict return precautions discussed and pt &/or family have verbalized understanding of the instructions. No further questions at time of discharge.    New Prescriptions   No medications on file    Follow Up: [facility] [place] [id number] [phone]    Gastroenterology, [place] [address] [place] [address] [place] [address] [phone]     [facility] [place] [id number] [phone] Schedule an appointment as soon as possible for a visit    Surgery, [place] [address] [place] [address] [place] [facility]  [facility] at [facility] [address] [place]        Seerat Peaden, Gwenyth Allegra, MD 10/21/22 [phone]

## 2022-10-21 NOTE — ED Triage Notes (Signed)
Patient complains of left sided chest pain and bilateral hand numbness worse when she wakes up.  Reports hx of blood clot in leg.  Also reports episodes of dizziness.  +nausea.  Denies pain radiation of pain.

## 2022-10-21 NOTE — ED Notes (Signed)
Patient Alert and oriented to baseline. Stable and ambulatory to baseline. Patient verbalized understanding of the discharge instructions.  Patient belongings were taken by the patient.   

## 2022-10-22 ENCOUNTER — Other Ambulatory Visit: Payer: Self-pay

## 2022-10-22 DIAGNOSIS — I10 Essential (primary) hypertension: Secondary | ICD-10-CM

## 2022-10-23 MED ORDER — AMLODIPINE BESYLATE 5 MG PO TABS: 5.0000 mg | ORAL_TABLET | Freq: Every day | ORAL | 11 refills | Status: DC

## 2022-11-11 ENCOUNTER — Ambulatory Visit: Payer: Medicaid Other

## 2022-11-14 ENCOUNTER — Other Ambulatory Visit: Payer: Self-pay | Admitting: Pharmacist

## 2022-11-14 DIAGNOSIS — Z7901 Long term (current) use of anticoagulants: Secondary | ICD-10-CM

## 2022-11-14 DIAGNOSIS — D6859 Other primary thrombophilia: Secondary | ICD-10-CM

## 2022-11-14 MED ORDER — WARFARIN SODIUM 10 MG PO TABS: 15.0000 mg | ORAL_TABLET | Freq: Every day | ORAL | 2 refills | Status: DC

## 2022-11-14 NOTE — Telephone Encounter (Signed)
Patient requesting refill on her warfarin prescription. Will send refill authorization. Currently taking 1 & 1/2 x 60m warfarin by mouth, once-daily. Reinforced necessity of follow-up INR value.

## 2022-12-16 ENCOUNTER — Ambulatory Visit: Payer: Medicaid Other | Admitting: Gastroenterology

## 2022-12-19 ENCOUNTER — Ambulatory Visit: Payer: Medicaid Other | Admitting: Neurology

## 2022-12-23 ENCOUNTER — Ambulatory Visit: Payer: Medicaid Other | Admitting: Gastroenterology

## 2023-01-20 ENCOUNTER — Ambulatory Visit: Payer: Medicaid Other | Admitting: Pharmacist

## 2023-01-20 DIAGNOSIS — D6859 Other primary thrombophilia: Secondary | ICD-10-CM | POA: Diagnosis present

## 2023-01-20 DIAGNOSIS — Z7901 Long term (current) use of anticoagulants: Secondary | ICD-10-CM

## 2023-01-20 LAB — POCT INR: INR: 3 (ref 2.0–3.0)

## 2023-01-20 NOTE — Patient Instructions (Signed)
Patient instructed to take medications as defined in the Anti-coagulation Track section of this encounter.  Patient instructed to take today's dose.  Patient instructed to take one-and-one-half (1 & 1/2) of your 10 milligram strength, white, warfarin tablets by mouth, once-daily. Patient verbalized understanding of these instructions.  
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
## 2023-02-13 ENCOUNTER — Other Ambulatory Visit: Payer: Self-pay | Admitting: Pharmacist

## 2023-02-13 DIAGNOSIS — Z7901 Long term (current) use of anticoagulants: Secondary | ICD-10-CM

## 2023-02-13 DIAGNOSIS — D6859 Other primary thrombophilia: Secondary | ICD-10-CM

## 2023-02-13 MED ORDER — WARFARIN SODIUM 10 MG PO TABS: 15.0000 mg | ORAL_TABLET | Freq: Every day | ORAL | 3 refills | Status: DC

## 2023-02-17 ENCOUNTER — Other Ambulatory Visit: Payer: Self-pay

## 2023-02-17 ENCOUNTER — Ambulatory Visit: Payer: Medicaid Other

## 2023-02-17 DIAGNOSIS — K581 Irritable bowel syndrome with constipation: Secondary | ICD-10-CM

## 2023-02-18 MED ORDER — LINACLOTIDE 72 MCG PO CAPS: 72.0000 ug | ORAL_CAPSULE | Freq: Every day | ORAL | 2 refills | Status: DC

## 2023-03-03 ENCOUNTER — Other Ambulatory Visit: Payer: Self-pay

## 2023-03-03 ENCOUNTER — Emergency Department (HOSPITAL_COMMUNITY)
Admission: EM | Admit: 2023-03-03 | Discharge: 2023-03-04 | Discharge status: Against Medical Advice | Payer: Medicaid Other | Attending: Emergency Medicine | Admitting: Emergency Medicine

## 2023-03-03 ENCOUNTER — Encounter (HOSPITAL_COMMUNITY): Payer: Self-pay

## 2023-03-03 ENCOUNTER — Emergency Department (HOSPITAL_COMMUNITY): Payer: Medicaid Other

## 2023-03-03 DIAGNOSIS — R0789 Other chest pain: Secondary | ICD-10-CM | POA: Insufficient documentation

## 2023-03-03 DIAGNOSIS — Z7901 Long term (current) use of anticoagulants: Secondary | ICD-10-CM | POA: Diagnosis not present

## 2023-03-03 DIAGNOSIS — Z7951 Long term (current) use of inhaled steroids: Secondary | ICD-10-CM | POA: Insufficient documentation

## 2023-03-03 DIAGNOSIS — R0602 Shortness of breath: Secondary | ICD-10-CM | POA: Diagnosis present

## 2023-03-03 DIAGNOSIS — I10 Essential (primary) hypertension: Secondary | ICD-10-CM | POA: Insufficient documentation

## 2023-03-03 DIAGNOSIS — Z79899 Other long term (current) drug therapy: Secondary | ICD-10-CM | POA: Insufficient documentation

## 2023-03-03 DIAGNOSIS — R519 Headache, unspecified: Secondary | ICD-10-CM | POA: Insufficient documentation

## 2023-03-03 DIAGNOSIS — Z5321 Procedure and treatment not carried out due to patient leaving prior to being seen by health care provider: Secondary | ICD-10-CM | POA: Insufficient documentation

## 2023-03-03 DIAGNOSIS — J45909 Unspecified asthma, uncomplicated: Secondary | ICD-10-CM | POA: Insufficient documentation

## 2023-03-03 LAB — CBC
HCT: 43.6 % (ref 36.0–46.0)
Hemoglobin: 13.7 g/dL (ref 12.0–15.0)
MCH: 24.5 pg — ABNORMAL LOW (ref 26.0–34.0)
MCHC: 31.4 g/dL (ref 30.0–36.0)
MCV: 78 fL — ABNORMAL LOW (ref 80.0–100.0)
Platelets: 191 10*3/uL (ref 150–400)
RBC: 5.59 MIL/uL — ABNORMAL HIGH (ref 3.87–5.11)
RDW: 14.6 % (ref 11.5–15.5)
WBC: 3.6 10*3/uL — ABNORMAL LOW (ref 4.0–10.5)
nRBC: 0 % (ref 0.0–0.2)

## 2023-03-03 LAB — BASIC METABOLIC PANEL
Anion gap: 9 (ref 5–15)
BUN: 13 mg/dL (ref 6–20)
CO2: 22 mmol/L (ref 22–32)
Calcium: 9.1 mg/dL (ref 8.9–10.3)
Chloride: 107 mmol/L (ref 98–111)
Creatinine, Ser: 0.92 mg/dL (ref 0.44–1.00)
GFR, Estimated: >60 mL/min (ref >60)
Glucose, Bld: 121 mg/dL — ABNORMAL HIGH (ref 70–99)
Potassium: 3.9 mmol/L (ref 3.5–5.1)
Sodium: 138 mmol/L (ref 135–145)

## 2023-03-03 LAB — TROPONIN I (HIGH SENSITIVITY): Troponin I (High Sensitivity): 3 ng/L (ref <18)

## 2023-03-03 MED ORDER — ACETAMINOPHEN 325 MG PO TABS
650.0000 mg | ORAL_TABLET | Freq: Once | ORAL | Status: AC
  Administered 2023-03-03: 650 mg via ORAL
  Filled 2023-03-03: qty 2

## 2023-03-03 NOTE — ED Triage Notes (Signed)
Pt began having HA two days ago not relieved by OTC meds. Pt started becoming SOB and chest pain tonight. Pt was sitting watching tv when Started. Pain is left sided chest and dull, radiates from right to left side.

## 2023-03-04 ENCOUNTER — Other Ambulatory Visit: Payer: Self-pay

## 2023-03-04 ENCOUNTER — Encounter (HOSPITAL_COMMUNITY): Payer: Self-pay | Admitting: Emergency Medicine

## 2023-03-04 ENCOUNTER — Emergency Department (HOSPITAL_COMMUNITY): Payer: Medicaid Other

## 2023-03-04 ENCOUNTER — Emergency Department (HOSPITAL_COMMUNITY)
Admission: EM | Admit: 2023-03-04 | Discharge: 2023-03-04 | Disposition: A | Discharge status: Home / Self Care | Payer: Medicaid Other | Source: Home / Self Care | Attending: Emergency Medicine | Admitting: Emergency Medicine

## 2023-03-04 ENCOUNTER — Encounter: Payer: Self-pay | Admitting: *Deleted

## 2023-03-04 DIAGNOSIS — R0602 Shortness of breath: Secondary | ICD-10-CM | POA: Insufficient documentation

## 2023-03-04 DIAGNOSIS — Z79899 Other long term (current) drug therapy: Secondary | ICD-10-CM | POA: Insufficient documentation

## 2023-03-04 DIAGNOSIS — R519 Headache, unspecified: Secondary | ICD-10-CM | POA: Insufficient documentation

## 2023-03-04 DIAGNOSIS — Z7951 Long term (current) use of inhaled steroids: Secondary | ICD-10-CM | POA: Insufficient documentation

## 2023-03-04 DIAGNOSIS — I10 Essential (primary) hypertension: Secondary | ICD-10-CM | POA: Insufficient documentation

## 2023-03-04 DIAGNOSIS — J45909 Unspecified asthma, uncomplicated: Secondary | ICD-10-CM | POA: Insufficient documentation

## 2023-03-04 DIAGNOSIS — Z7901 Long term (current) use of anticoagulants: Secondary | ICD-10-CM | POA: Insufficient documentation

## 2023-03-04 LAB — PROTIME-INR
INR: 2.4 — ABNORMAL HIGH (ref 0.8–1.2)
Prothrombin Time: 26.4 seconds — ABNORMAL HIGH (ref 11.4–15.2)

## 2023-03-04 LAB — TROPONIN I (HIGH SENSITIVITY): Troponin I (High Sensitivity): 3 ng/L (ref <18)

## 2023-03-04 MED ORDER — OXYCODONE-ACETAMINOPHEN 5-325 MG PO TABS
1.0000 | ORAL_TABLET | Freq: Once | ORAL | Status: AC
  Administered 2023-03-04: 1 via ORAL
  Filled 2023-03-04: qty 1

## 2023-03-04 MED ORDER — ALBUTEROL SULFATE HFA 108 (90 BASE) MCG/ACT IN AERS: 1.0000 | INHALATION_SPRAY | Freq: Four times a day (QID) | RESPIRATORY_TRACT | 0 refills | Status: DC | PRN

## 2023-03-04 MED ORDER — PROCHLORPERAZINE MALEATE 10 MG PO TABS: 10.0000 mg | ORAL_TABLET | Freq: Two times a day (BID) | ORAL | 0 refills | Status: DC | PRN

## 2023-03-04 MED ORDER — IOHEXOL 350 MG/ML SOLN
75.0000 mL | Freq: Once | INTRAVENOUS | Status: AC | PRN
  Administered 2023-03-04: 75 mL via INTRAVENOUS

## 2023-03-04 MED ORDER — DIPHENHYDRAMINE HCL 25 MG PO TABS: 25.0000 mg | ORAL_TABLET | Freq: Four times a day (QID) | ORAL | 0 refills | Status: DC

## 2023-03-04 NOTE — ED Triage Notes (Signed)
Pt reports 3 days of ongoing headache unrelieved by tylenol. PT also c/o SOb that began last night. Pt has used her inhalers with little relief. Pt states she was here last night for same but left before being seen.

## 2023-03-04 NOTE — ED Notes (Signed)
Pt left AMA °

## 2023-03-04 NOTE — Discharge Instructions (Addendum)
Please take your medications as prescribed.  You can also take Tylenol every 6 hours for pain/headache. I recommend close follow-up with PCP for reevaluation.  Please do not hesitate to return to emergency department if worrisome signs symptoms we discussed become apparent.
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
## 2023-03-04 NOTE — ED Notes (Signed)
Called pt to obtain v/s, no answer

## 2023-03-10 ENCOUNTER — Ambulatory Visit: Payer: Medicaid Other | Admitting: Gastroenterology
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
## 2023-04-01 ENCOUNTER — Ambulatory Visit: Payer: MEDICAID | Admitting: Pharmacist

## 2023-04-01 DIAGNOSIS — D6859 Other primary thrombophilia: Secondary | ICD-10-CM | POA: Diagnosis not present

## 2023-04-01 DIAGNOSIS — Z7901 Long term (current) use of anticoagulants: Secondary | ICD-10-CM | POA: Diagnosis not present

## 2023-04-01 LAB — POCT INR: INR: 2.5 (ref 2.0–3.0)
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
## 2023-04-01 NOTE — Patient Instructions (Signed)
Patient instructed to take medications as defined in the Anti-coagulation Track section of this encounter.  Patient instructed to take today's dose.  Patient instructed to take one-and-one-half (1 & 1/2) of your 10 milligram strength, white, warfarin tablets by mouth, once-daily. Patient verbalized understanding of these instructions.  

## 2023-05-12 ENCOUNTER — Ambulatory Visit: Payer: MEDICAID

## 2023-06-14 ENCOUNTER — Other Ambulatory Visit: Payer: Self-pay | Admitting: Pharmacist

## 2023-06-14 DIAGNOSIS — Z7901 Long term (current) use of anticoagulants: Secondary | ICD-10-CM

## 2023-06-14 DIAGNOSIS — D6859 Other primary thrombophilia: Secondary | ICD-10-CM

## 2023-06-14 MED ORDER — WARFARIN SODIUM 10 MG PO TABS: 15.0000 mg | ORAL_TABLET | Freq: Every day | ORAL | 3 refills | Status: DC

## 2023-06-15 ENCOUNTER — Other Ambulatory Visit (HOSPITAL_COMMUNITY): Payer: Self-pay

## 2023-06-15 ENCOUNTER — Telehealth: Payer: Self-pay | Admitting: Pharmacist

## 2023-06-15 DIAGNOSIS — Z7901 Long term (current) use of anticoagulants: Secondary | ICD-10-CM

## 2023-06-15 DIAGNOSIS — D6859 Other primary thrombophilia: Secondary | ICD-10-CM

## 2023-06-15 MED ORDER — WARFARIN SODIUM 10 MG PO TABS
15.0000 mg | ORAL_TABLET | Freq: Every day | ORAL | 3 refills | Status: DC
  Filled 2023-06-15: qty 44, 29d supply, fill #0

## 2023-06-15 NOTE — Telephone Encounter (Signed)
Received phone call from the patient from her [facility] stating that they would not fill her warfarin prescription 2/2 it being rejected due to the instructions being for 1&1/2 x 10 mg (15 mg) daily dose. Spoke w/Pharmacist who gave the same response. RPh also told the patient that I had been kicked out of the [facility] database which was preventing them from filling the Rx. I am sending refill authorization to [facility], [facility].

## 2023-06-16 ENCOUNTER — Other Ambulatory Visit: Payer: Self-pay | Admitting: Gastroenterology

## 2023-06-16 ENCOUNTER — Other Ambulatory Visit (HOSPITAL_COMMUNITY): Payer: Self-pay

## 2023-06-16 DIAGNOSIS — R1013 Epigastric pain: Secondary | ICD-10-CM

## 2023-06-16 NOTE — Telephone Encounter (Signed)
Noted. Thanks.

## 2023-06-17 ENCOUNTER — Other Ambulatory Visit: Payer: Self-pay

## 2023-06-17 ENCOUNTER — Ambulatory Visit: Payer: Medicaid Other | Admitting: Neurology

## 2023-06-26 ENCOUNTER — Other Ambulatory Visit (HOSPITAL_COMMUNITY): Payer: Self-pay

## 2023-07-08 ENCOUNTER — Ambulatory Visit
Admission: RE | Admit: 2023-07-08 | Discharge: 2023-07-08 | Disposition: A | Discharge status: Home / Self Care | Payer: MEDICAID | Source: Ambulatory Visit | Attending: Gastroenterology | Admitting: Gastroenterology

## 2023-07-08 DIAGNOSIS — R1013 Epigastric pain: Secondary | ICD-10-CM

## 2023-09-08 ENCOUNTER — Ambulatory Visit: Payer: MEDICAID | Admitting: Pharmacist

## 2023-09-08 DIAGNOSIS — D6859 Other primary thrombophilia: Secondary | ICD-10-CM

## 2023-09-08 DIAGNOSIS — Z7901 Long term (current) use of anticoagulants: Secondary | ICD-10-CM

## 2023-09-08 LAB — POCT INR: INR: 3.3 — AB (ref 2.0–3.0)

## 2023-09-08 MED ORDER — WARFARIN SODIUM 10 MG PO TABS: ORAL_TABLET | ORAL | 3 refills | Status: DC

## 2023-09-08 NOTE — Patient Instructions (Signed)
Patient instructed to take medications as defined in the Anti-coagulation Track section of this encounter.  Patient instructed to take today's dose.  Patient instructed to take one-and-one-half (1 & 1/2) of your 10 milligram strength, white, warfarin tablets by mouth, every day EXCEPT on MONDAYS. On MONDAYS, take ONLY one (1) tablet. Patient verbalized understanding of these instructions.
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
## 2023-09-12 NOTE — Progress Notes (Signed)
INTERNAL MEDICINE TEACHING ATTENDING ADDENDUM   I agree with pharmacy recommendations as outlined in their note.   Lyberti Thrush, MD  

## 2023-09-16 ENCOUNTER — Other Ambulatory Visit: Payer: Self-pay | Admitting: Student

## 2023-09-16 ENCOUNTER — Ambulatory Visit
Admission: RE | Admit: 2023-09-16 | Discharge: 2023-09-16 | Disposition: A | Discharge status: Home / Self Care | Payer: MEDICAID | Source: Ambulatory Visit

## 2023-09-16 DIAGNOSIS — Z1231 Encounter for screening mammogram for malignant neoplasm of breast: Secondary | ICD-10-CM

## 2023-09-29 ENCOUNTER — Other Ambulatory Visit (HOSPITAL_COMMUNITY)
Admission: RE | Admit: 2023-09-29 | Discharge: 2023-09-29 | Disposition: A | Discharge status: Home / Self Care | Payer: MEDICAID | Source: Ambulatory Visit | Attending: Internal Medicine | Admitting: Internal Medicine

## 2023-09-29 ENCOUNTER — Telehealth: Payer: Self-pay

## 2023-09-29 ENCOUNTER — Ambulatory Visit: Payer: MEDICAID | Admitting: Internal Medicine

## 2023-09-29 VITALS — BP 130/82 | HR 93 | Temp 98.9°F | Ht 64.0 in | Wt 233.7 lb

## 2023-09-29 DIAGNOSIS — I10 Essential (primary) hypertension: Secondary | ICD-10-CM | POA: Diagnosis not present

## 2023-09-29 DIAGNOSIS — Z01419 Encounter for gynecological examination (general) (routine) without abnormal findings: Secondary | ICD-10-CM | POA: Insufficient documentation

## 2023-09-29 DIAGNOSIS — Z1151 Encounter for screening for human papillomavirus (HPV): Secondary | ICD-10-CM | POA: Insufficient documentation

## 2023-09-29 DIAGNOSIS — Z711 Person with feared health complaint in whom no diagnosis is made: Secondary | ICD-10-CM | POA: Diagnosis present

## 2023-09-29 DIAGNOSIS — Z124 Encounter for screening for malignant neoplasm of cervix: Secondary | ICD-10-CM | POA: Insufficient documentation

## 2023-09-29 DIAGNOSIS — Z6841 Body Mass Index (BMI) 40.0 and over, adult: Secondary | ICD-10-CM

## 2023-09-29 DIAGNOSIS — N888 Other specified noninflammatory disorders of cervix uteri: Secondary | ICD-10-CM | POA: Insufficient documentation

## 2023-09-29 DIAGNOSIS — N9089 Other specified noninflammatory disorders of vulva and perineum: Secondary | ICD-10-CM | POA: Diagnosis not present

## 2023-09-29 DIAGNOSIS — K581 Irritable bowel syndrome with constipation: Secondary | ICD-10-CM

## 2023-09-29 DIAGNOSIS — E782 Mixed hyperlipidemia: Secondary | ICD-10-CM

## 2023-09-29 DIAGNOSIS — K219 Gastro-esophageal reflux disease without esophagitis: Secondary | ICD-10-CM | POA: Diagnosis not present

## 2023-09-29 DIAGNOSIS — K259 Gastric ulcer, unspecified as acute or chronic, without hemorrhage or perforation: Secondary | ICD-10-CM

## 2023-09-29 DIAGNOSIS — Z113 Encounter for screening for infections with a predominantly sexual mode of transmission: Secondary | ICD-10-CM | POA: Diagnosis not present

## 2023-09-29 DIAGNOSIS — K2980 Duodenitis without bleeding: Secondary | ICD-10-CM

## 2023-09-29 DIAGNOSIS — E785 Hyperlipidemia, unspecified: Secondary | ICD-10-CM | POA: Diagnosis not present

## 2023-09-29 DIAGNOSIS — K2970 Gastritis, unspecified, without bleeding: Secondary | ICD-10-CM

## 2023-09-29 DIAGNOSIS — E66813 Obesity, class 3: Secondary | ICD-10-CM

## 2023-09-29 LAB — CERVICOVAGINAL ANCILLARY ONLY
Bacterial Vaginitis (gardnerella): NEGATIVE
Candida Glabrata: NEGATIVE
Candida Vaginitis: NEGATIVE
Chlamydia: NEGATIVE
Comment: NEGATIVE
Comment: NEGATIVE
Comment: NEGATIVE
Comment: NEGATIVE
Comment: NEGATIVE
Comment: NORMAL
Neisseria Gonorrhea: NEGATIVE
Trichomonas: NEGATIVE

## 2023-09-29 MED ORDER — OMEPRAZOLE MAGNESIUM 20 MG PO TBEC: 20.0000 mg | DELAYED_RELEASE_TABLET | Freq: Two times a day (BID) | ORAL | 1 refills | Status: DC

## 2023-09-29 MED ORDER — LORATADINE 10 MG PO TABS: 10.0000 mg | ORAL_TABLET | Freq: Every day | ORAL | 0 refills | Status: DC | PRN

## 2023-09-29 MED ORDER — AMLODIPINE BESYLATE 5 MG PO TABS: 5.0000 mg | ORAL_TABLET | Freq: Every day | ORAL | 11 refills | Status: DC

## 2023-09-29 MED ORDER — ALBUTEROL SULFATE HFA 108 (90 BASE) MCG/ACT IN AERS: 1.0000 | INHALATION_SPRAY | Freq: Four times a day (QID) | RESPIRATORY_TRACT | 0 refills | Status: DC | PRN

## 2023-09-29 MED ORDER — LINACLOTIDE 72 MCG PO CAPS
72.0000 ug | ORAL_CAPSULE | Freq: Every day | ORAL | 2 refills | Status: DC
Start: 2023-09-29 — End: 2024-04-20

## 2023-09-29 NOTE — Progress Notes (Signed)
Internal Medicine Clinic Attending  I was physically present during the key portions of the resident provided service and participated in the medical decision making of patient's management care. I reviewed pertinent patient test results.  The assessment, diagnosis, and plan were formulated together and I agree with the documentation in the resident's note.  Mercie Eon, MD

## 2023-09-29 NOTE — Assessment & Plan Note (Signed)
Pap smear obtained at today's visit. Referral to OB/Gyn placed given history of cervical mass that has not been evaluated by their office, and physical exam findings on today's exam (see note).

## 2023-09-29 NOTE — Assessment & Plan Note (Signed)
Not on outpatient therapies. Lipid panel last checked 01/2021 with LDL above goal, 153.  ASCVD risk based on last lipid panel 8.6%. Plan:Lipid panel today. I suspect she will need to start a statin.

## 2023-09-29 NOTE — Assessment & Plan Note (Signed)
GERD Plan: Refilled omeprazole 20 mg BID.

## 2023-09-29 NOTE — Assessment & Plan Note (Signed)
BP 130/82. OP regimen is amlodipine 5 mg daily which she is compliant with. BMP last checked 01/2023 with normal renal function and electrolytes. Plan:Continue current regimen.

## 2023-09-29 NOTE — Telephone Encounter (Signed)
Decision:Approved  Elesa Massed (Key: [facility]) PA Case ID #: [id number] Rx #: [id number] Need Help? Call us at [phone] Outcome Approved today by [employer] 2017 Approved. OMEPRAZOLE MAGNESIUM 20MG  Tablet DR is approved from 09/24/2023 to 09/28/2024. All strengths of the drug are approved. Authorization Expiration Date: 09/28/2024 Drug Omeprazole Magnesium 20MG  dr tablets ePA cloud logo Form [employer] Electronic Prior Authorization Form Original Claim Info 75 . Call Help Desk at [phone] for assistance. For a 3 day temporary supply, submit DUR PPS Level of Service Code 03.Marland Kitchen

## 2023-09-29 NOTE — Assessment & Plan Note (Addendum)
Patient expresses concern that she may have contracted an STD as her partner is not monogamous. She denies vaginal discharge, itch, abnormal bleeding. She is requesting a full STD panel. Plan:Swab collected for GC/chlamydia, gonorrhea, trichomonas,and serum sent for HIV and hepatitis C. Will treat any positive results.

## 2023-09-29 NOTE — Assessment & Plan Note (Signed)
Patient is requesting an injectable weight loss medication, however she is not currently diagnosed with diabetes and this will be unaffordable for her. She further requests an oral agent however with her mental health medications I am not confident that an oral agent would be safe after reviewing UpToDate Drug Interactions. We discussed the importance of exercise and healthy diet. She is interested in a referral to the physician's weight loss program. Plan:Referral placed to physician's weight loss program.

## 2023-09-29 NOTE — Patient Instructions (Addendum)
It was a pleasure to care for you today!  I will let you know what your results show.  I have sent in medication refills for you.  I have placed a referral to OB/Gyn for you.  Please follow up in about 3 months.  My best, Dr. August Saucer

## 2023-09-29 NOTE — Telephone Encounter (Signed)
Prior Authorization for patient (Omeprazole Magnesium 20MG  dr tablets) came through on cover my meds was submitted with last office notes awaiting approval or denial.  IHK:[id number]
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
## 2023-09-30 LAB — CYTOLOGY - PAP
Adequacy: ABSENT
Comment: NEGATIVE
Diagnosis: NEGATIVE
High risk HPV: NEGATIVE

## 2023-09-30 LAB — HCV AB W REFLEX TO QUANT PCR: HCV Ab: NONREACTIVE

## 2023-09-30 LAB — LIPID PANEL
Chol/HDL Ratio: 4.3 {ratio} (ref 0.0–4.4)
Cholesterol, Total: 204 mg/dL — ABNORMAL HIGH (ref 100–199)
HDL: 47 mg/dL (ref >39)
LDL Chol Calc (NIH): 126 mg/dL — ABNORMAL HIGH (ref 0–99)
Triglycerides: 172 mg/dL — ABNORMAL HIGH (ref 0–149)
VLDL Cholesterol Cal: 31 mg/dL (ref 5–40)

## 2023-09-30 LAB — HCV INTERPRETATION

## 2023-09-30 LAB — HEMOGLOBIN A1C
Est. average glucose Bld gHb Est-mCnc: 134 mg/dL
Hgb A1c MFr Bld: 6.3 % — ABNORMAL HIGH (ref 4.8–5.6)

## 2023-09-30 LAB — HIV ANTIBODY (ROUTINE TESTING W REFLEX): HIV Screen 4th Generation wRfx: NONREACTIVE

## 2023-10-03 ENCOUNTER — Other Ambulatory Visit: Payer: Self-pay | Admitting: Internal Medicine

## 2023-10-03 ENCOUNTER — Telehealth: Payer: Self-pay | Admitting: Internal Medicine

## 2023-10-03 NOTE — Telephone Encounter (Signed)
 Discussed lab results from recent OV with patient. We discussed my recommendation for starting a moderate intensity statin based on ASCVD risk of 8.5%. She explains that she eats a lot of fried food and doesn't exercise much and would like to try to implement these lifestyle changes prior to starting a medication. I told her that this was reasonable and would help her HbA1c as well. I did advise that if her cholesterol remains elevated at her next OV despite these changes, I recommend starting a statin and she is in agreement.  She also expresses concern regarding severe back pain. She would like to know if she can come in Monday to be evaluated. I do not see openings at this time and advised her to call the clinic Monday morning to see if there are any openings/cancellations, and in the mean time, to go to the ED or UC if the pain gets worse. She is in agreement with this plan.  Damien Hutchinson, DO

## 2023-10-05 ENCOUNTER — Emergency Department (HOSPITAL_COMMUNITY): Payer: MEDICAID

## 2023-10-05 ENCOUNTER — Emergency Department (HOSPITAL_COMMUNITY)
Admission: EM | Admit: 2023-10-05 | Discharge: 2023-10-05 | Disposition: A | Discharge status: Home / Self Care | Payer: MEDICAID | Attending: Emergency Medicine | Admitting: Emergency Medicine

## 2023-10-05 ENCOUNTER — Other Ambulatory Visit: Payer: Self-pay

## 2023-10-05 DIAGNOSIS — Z79899 Other long term (current) drug therapy: Secondary | ICD-10-CM | POA: Diagnosis not present

## 2023-10-05 DIAGNOSIS — R109 Unspecified abdominal pain: Secondary | ICD-10-CM

## 2023-10-05 DIAGNOSIS — M546 Pain in thoracic spine: Secondary | ICD-10-CM | POA: Diagnosis not present

## 2023-10-05 DIAGNOSIS — Z7901 Long term (current) use of anticoagulants: Secondary | ICD-10-CM | POA: Insufficient documentation

## 2023-10-05 DIAGNOSIS — R1013 Epigastric pain: Secondary | ICD-10-CM | POA: Diagnosis not present

## 2023-10-05 DIAGNOSIS — M549 Dorsalgia, unspecified: Secondary | ICD-10-CM | POA: Diagnosis present

## 2023-10-05 DIAGNOSIS — I10 Essential (primary) hypertension: Secondary | ICD-10-CM | POA: Diagnosis not present

## 2023-10-05 LAB — CBC WITH DIFFERENTIAL/PLATELET
Abs Immature Granulocytes: 0.01 10*3/uL (ref 0.00–0.07)
Basophils Absolute: 0 10*3/uL (ref 0.0–0.1)
Basophils Relative: 1 %
Eosinophils Absolute: 0.1 10*3/uL (ref 0.0–0.5)
Eosinophils Relative: 2 %
HCT: 42 % (ref 36.0–46.0)
Hemoglobin: 13.3 g/dL (ref 12.0–15.0)
Immature Granulocytes: 0 %
Lymphocytes Relative: 47 %
Lymphs Abs: 1.7 10*3/uL (ref 0.7–4.0)
MCH: 25.2 pg — ABNORMAL LOW (ref 26.0–34.0)
MCHC: 31.7 g/dL (ref 30.0–36.0)
MCV: 79.7 fL — ABNORMAL LOW (ref 80.0–100.0)
Monocytes Absolute: 0.4 10*3/uL (ref 0.1–1.0)
Monocytes Relative: 11 %
Neutro Abs: 1.4 10*3/uL — ABNORMAL LOW (ref 1.7–7.7)
Neutrophils Relative %: 39 %
Platelets: 180 10*3/uL (ref 150–400)
RBC: 5.27 MIL/uL — ABNORMAL HIGH (ref 3.87–5.11)
RDW: 14.8 % (ref 11.5–15.5)
WBC: 3.6 10*3/uL — ABNORMAL LOW (ref 4.0–10.5)
nRBC: 0 % (ref 0.0–0.2)

## 2023-10-05 LAB — COMPREHENSIVE METABOLIC PANEL
ALT: 25 U/L (ref 0–44)
AST: 22 U/L (ref 15–41)
Albumin: 3.1 g/dL — ABNORMAL LOW (ref 3.5–5.0)
Alkaline Phosphatase: 36 U/L — ABNORMAL LOW (ref 38–126)
Anion gap: 8 (ref 5–15)
BUN: 11 mg/dL (ref 6–20)
CO2: 23 mmol/L (ref 22–32)
Calcium: 9.2 mg/dL (ref 8.9–10.3)
Chloride: 110 mmol/L (ref 98–111)
Creatinine, Ser: 0.91 mg/dL (ref 0.44–1.00)
GFR, Estimated: >60 mL/min (ref >60)
Glucose, Bld: 90 mg/dL (ref 70–99)
Potassium: 3.9 mmol/L (ref 3.5–5.1)
Sodium: 141 mmol/L (ref 135–145)
Total Bilirubin: 0.5 mg/dL (ref 0.0–1.2)
Total Protein: 6.2 g/dL — ABNORMAL LOW (ref 6.5–8.1)

## 2023-10-05 LAB — URINALYSIS, ROUTINE W REFLEX MICROSCOPIC
Bilirubin Urine: NEGATIVE
Glucose, UA: NEGATIVE mg/dL
Ketones, ur: NEGATIVE mg/dL
Leukocytes,Ua: NEGATIVE
Nitrite: NEGATIVE
Protein, ur: NEGATIVE mg/dL
Specific Gravity, Urine: 1.03 (ref 1.005–1.030)
pH: 5 (ref 5.0–8.0)

## 2023-10-05 LAB — LIPASE, BLOOD: Lipase: 43 U/L (ref 11–51)

## 2023-10-05 LAB — HCG, SERUM, QUALITATIVE: Preg, Serum: NEGATIVE

## 2023-10-05 MED ORDER — LIDOCAINE 5 % EX PTCH
1.0000 | MEDICATED_PATCH | CUTANEOUS | Status: DC
  Administered 2023-10-05: 1 via TRANSDERMAL
  Filled 2023-10-05: qty 1

## 2023-10-05 MED ORDER — METHOCARBAMOL 500 MG PO TABS
500.0000 mg | ORAL_TABLET | Freq: Once | ORAL | Status: AC
  Administered 2023-10-05: 500 mg via ORAL
  Filled 2023-10-05: qty 1

## 2023-10-05 MED ORDER — KETOROLAC TROMETHAMINE 15 MG/ML IJ SOLN
15.0000 mg | Freq: Once | INTRAMUSCULAR | Status: DC
  Filled 2023-10-05: qty 1

## 2023-10-05 MED ORDER — ONDANSETRON HCL 4 MG/2ML IJ SOLN
4.0000 mg | Freq: Once | INTRAMUSCULAR | Status: AC
  Administered 2023-10-05: 4 mg via INTRAVENOUS
  Filled 2023-10-05: qty 2

## 2023-10-05 MED ORDER — ALBUTEROL SULFATE HFA 108 (90 BASE) MCG/ACT IN AERS: 2.0000 | INHALATION_SPRAY | RESPIRATORY_TRACT | Status: DC | PRN

## 2023-10-05 MED ORDER — METHOCARBAMOL 500 MG PO TABS: 500.0000 mg | ORAL_TABLET | Freq: Two times a day (BID) | ORAL | 0 refills | Status: DC

## 2023-10-05 MED ORDER — ACETAMINOPHEN 500 MG PO TABS
1000.0000 mg | ORAL_TABLET | Freq: Once | ORAL | Status: AC
  Administered 2023-10-05: 1000 mg via ORAL
  Filled 2023-10-05: qty 2

## 2023-10-05 MED ORDER — IOHEXOL 350 MG/ML SOLN
75.0000 mL | Freq: Once | INTRAVENOUS | Status: AC | PRN
  Administered 2023-10-05: 75 mL via INTRAVENOUS

## 2023-10-05 MED ORDER — MORPHINE SULFATE (PF) 4 MG/ML IV SOLN
4.0000 mg | Freq: Once | INTRAVENOUS | Status: AC
  Administered 2023-10-05: 4 mg via INTRAVENOUS
  Filled 2023-10-05: qty 1

## 2023-10-05 NOTE — ED Provider Notes (Signed)
 6:36 PM Assumed care of patient from off-going team. For more details, please see note from same day.  In brief, this is a 48 y.o. female with L flank pain. Has had PEs in the past.   Plan/Dispo at time of sign-out & ED Course since sign-out: [ ]  CT PE, CT abd pelvis  BP 116/88 (BP Location: Right Arm)   Pulse 68   Temp 98.5 F (36.9 C) (Oral)   Resp 16   SpO2 100%    ED Course:   Clinical Course as of 10/05/23 1836  [place] Oct 05, 2023  1227 Chest x-ray without acute findings [JK]  1328 CBC normal.  Metabolic panel normal.  Lipase normal.  Pregnancy test negative [JK]  1557 CT Angio Chest PE W and/or Wo Contrast 1. No evidence for acute pulmonary embolism. 2. No acute cardiopulmonary abnormalities. 3. Coronary artery calcifications. 4. Old granulomatous disease. 5. Right upper lobe lung nodule measures 4 mm. Stable from 2021 compatible with a benign nodule.   [HN]  1618 CT ABDOMEN PELVIS W CONTRAST 1. No acute findings within the abdomen or pelvis. 2. Colonic diverticulosis without signs of acute diverticulitis. 3.  Aortic Atherosclerosis (ICD10-I70.0).   [HN]  1645 Urinalysis, Routine w reflex microscopic -Urine, Clean Catch(!) Neg for UTI [HN]  1835 Pt felt somewhat improved from meds. W/u very reassuring and pain is likely MSK-related. Advised tylenol /ibuprofen , heat/massage/stretching. Will prescribe methocarbamol  and give work note as requested. DC w/ discharge instructions/return precautions. Instructed to f/u with PCP.  All questions answered to patient's satisfaction.   [HN]    Clinical Course User Index [HN] Franklyn Sid SAILOR, MD [JK] Randol Simmonds, MD   Dispo:D C ------------------------------- Sid Franklyn, MD Emergency Medicine  This note was created using dictation software, which may contain spelling or grammatical errors.   Franklyn Sid SAILOR, MD 10/05/23 [id number]

## 2023-10-05 NOTE — ED Provider Notes (Signed)
 [place] EMERGENCY DEPARTMENT AT [facility] Provider Note   CSN: [id number] Arrival date & time: 10/05/23  0847     History  Chief Complaint  Patient presents with   Back Pain    Michele Gillespie is a 48 y.o. female.   Back Pain    Patient has a history of pulmonary embolism DVT anemia hypertension schizophrenia who presents ED with complaints of right-sided back pain.  Patient states it has been ongoing for the last week or so.  Its mostly in the mid part of her back.  She also has been having some trouble with feeling short of breath.  Some discomfort in her upper abdomen no fevers or chills.  Home Medications Prior to Admission medications   Medication Sig Start Date End Date Taking? Authorizing Provider  acetaminophen  (TYLENOL ) 500 MG tablet Take 1,000 mg by mouth every 6 (six) hours as needed for moderate pain or headache. Patient not taking: Reported on 04/01/2023    [provider]  albuterol  (VENTOLIN  HFA) 108 (90 Base) MCG/ACT inhaler Inhale 1-2 puffs into the lungs every 6 (six) hours as needed for wheezing or shortness of breath. 09/29/23   Addie Perkins, DO  amLODipine  (NORVASC ) 5 MG tablet Take 1 tablet (5 mg total) by mouth daily. 09/29/23   Addie Perkins, DO  ARIPiprazole  (ABILIFY ) 10 MG tablet Take 1 tablet (10 mg total) by mouth daily. 05/21/21   Jacquetta Sharlot GRADE, NP  citalopram  (CELEXA ) 10 MG tablet Take 1 tablet (10 mg total) by mouth daily. 09/26/21 10/22/23  Arnett Saunders, MD  diphenhydrAMINE  (BENADRYL ) 25 MG tablet Take 1 tablet (25 mg total) by mouth every 6 (six) hours for 20 doses. 03/04/23 03/09/23  Ladora Congress, PA  divalproex  (DEPAKOTE ) 500 MG DR tablet Take 1 tablet (500 mg total) by mouth 2 (two) times daily. 09/26/21 10/22/23  Arnett Saunders, MD  fluticasone  (FLONASE ) 50 MCG/ACT nasal spray Place 1 spray into both nostrils daily. Patient taking differently: Place 1 spray into both nostrils daily as needed for allergies. 12/21/21   Hazen Darryle BRAVO,  FNP  linaclotide  (LINZESS ) 72 MCG capsule Take 1 capsule (72 mcg total) by mouth daily before breakfast. 09/29/23   Addie Perkins, DO  loratadine  (CLARITIN ) 10 MG tablet Take 1 tablet (10 mg total) by mouth daily as needed for allergies. 09/29/23   Addie Perkins, DO  omeprazole  (PRILOSEC  OTC) 20 MG tablet Take 1 tablet (20 mg total) by mouth in the morning and at bedtime. 09/29/23 09/28/24  Addie Perkins, DO  warfarin (COUMADIN ) 10 MG tablet Take one (1) tablet only on Mondays. All other days, take one and one half (1&1/2) tablets. 09/08/23   Groce, James B, RPH-CPP      Allergies    Patient has no known allergies.    Review of Systems   Review of Systems  Musculoskeletal:  Positive for back pain.    Physical Exam Updated Vital Signs BP (!) 132/100 (BP Location: Right Arm)   Pulse 89   Temp 99.5 F (37.5 C)   Resp 20   SpO2 96%  Physical Exam Vitals and nursing note reviewed.  Constitutional:      Appearance: She is well-developed. She is not diaphoretic.  HENT:     Head: Normocephalic and atraumatic.     Right Ear: External ear normal.     Left Ear: External ear normal.  Eyes:     General: No scleral icterus.       Right eye: No  discharge.        Left eye: No discharge.     Conjunctiva/sclera: Conjunctivae normal.  Neck:     Trachea: No tracheal deviation.  Cardiovascular:     Rate and Rhythm: Normal rate and regular rhythm.  Pulmonary:     Effort: Pulmonary effort is normal. No respiratory distress.     Breath sounds: Normal breath sounds. No stridor. No wheezing or rales.  Abdominal:     General: Bowel sounds are normal. There is no distension.     Palpations: Abdomen is soft.     Tenderness: There is no abdominal tenderness. There is no guarding or rebound.     Comments: Mild tenderness palpation epigastric region  Musculoskeletal:        General: No tenderness or deformity.     Cervical back: Neck supple.     Comments: Mild tenderness palpation paraspinal thoracic  region  Skin:    General: Skin is warm and dry.     Findings: No rash.  Neurological:     General: No focal deficit present.     Mental Status: She is alert.     Cranial Nerves: No cranial nerve deficit, dysarthria or facial asymmetry.     Sensory: No sensory deficit.     Motor: No abnormal muscle tone or seizure activity.     Coordination: Coordination normal.  Psychiatric:        Mood and Affect: Mood normal.     ED Results / Procedures / Treatments   Labs (all labs ordered are listed, but only abnormal results are displayed) Labs Reviewed - No data to display  EKG None  Radiology DG Chest 2 View Result Date: 10/05/2023 CLINICAL DATA:  Shortness of breath. Right lower posterior thoracic pain. History of DVT and asthma. EXAM: CHEST - 2 VIEW COMPARISON:  Radiographs 03/03/2023 and 10/21/2022.  CT 03/04/2023. FINDINGS: The heart size and mediastinal contours are normal. The lungs are clear. There is no pleural effusion or pneumothorax. No acute osseous findings are identified. IMPRESSION: No evidence of acute cardiopulmonary process. Electronically Signed   By: Elsie Perone M.D.   On: 10/05/2023 09:42    Procedures Procedures    Medications Ordered in ED Medications  albuterol  (VENTOLIN  HFA) 108 (90 Base) MCG/ACT inhaler 2 puff (has no administration in time range)    ED Course/ Medical Decision Making/ A&P Clinical Course as of 10/05/23 1902  [place] Oct 05, 2023  1227 Chest x-ray without acute findings [JK]  1328 CBC normal.  Metabolic panel normal.  Lipase normal.  Pregnancy test negative [JK]  1557 CT Angio Chest PE W and/or Wo Contrast 1. No evidence for acute pulmonary embolism. 2. No acute cardiopulmonary abnormalities. 3. Coronary artery calcifications. 4. Old granulomatous disease. 5. Right upper lobe lung nodule measures 4 mm. Stable from 2021 compatible with a benign nodule.   [HN]  1618 CT ABDOMEN PELVIS W CONTRAST 1. No acute findings within the abdomen  or pelvis. 2. Colonic diverticulosis without signs of acute diverticulitis. 3.  Aortic Atherosclerosis (ICD10-I70.0).   [HN]  1645 Urinalysis, Routine w reflex microscopic -Urine, Clean Catch(!) Neg for UTI [HN]  1835 Pt felt somewhat improved from meds. W/u very reassuring and pain is likely MSK-related. Advised tylenol /ibuprofen , heat/massage/stretching. Will prescribe methocarbamol  and give work note as requested. DC w/ discharge instructions/return precautions. Instructed to f/u with PCP.  All questions answered to patient's satisfaction.   [HN]    Clinical Course User Index [HN] Franklyn Sid SAILOR, MD [JK]  Randol Simmonds, MD                                 Medical Decision Making DDX includes, PE, PNA, renal colic, musculoskeletal pain  Amount and/or Complexity of Data Reviewed Labs: ordered. Decision-making details documented in ED Course. Radiology: ordered. Decision-making details documented in ED Course.  Risk OTC drugs. Prescription drug management.   Pt presented to ED with complaints of flank pain.  Hx of pe in the past.  Pt currently on anticoagulation.  Labs without hepatitis, pancreatitis, renal dysfunction.  UA without signs of infection or hematuria.  CT scans pending at shift change.  If negative anticipate dc.  Care turned over to Dr Franklyn        Final Clinical Impression(s) / ED Diagnoses Final diagnoses:  None    Rx / DC Orders ED Discharge Orders     None         Randol Simmonds, MD 10/05/23 NANCYANN

## 2023-10-05 NOTE — Discharge Instructions (Signed)
 Thank you for coming to [facility] Emergency Department. You were seen for left flank pain. We did an exam, labs, and imaging, and these showed no acute findings. Likely your pain is musculoskeletal in origin. You can alternate taking Tylenol  and ibuprofen  as needed for pain. You can take 650mg  tylenol  (acetaminophen ) every 4-6 hours, and 600 mg ibuprofen  3 times a day. Massage, heat packs, and stretching can also help. Please follow up with your primary care provider within 1-2 weeks if your pain does not improve.  Do not hesitate to return to the ED or call 911 if you experience: -Worsening symptoms -New or concerning symptoms -Lightheadedness, passing out -Fevers/chills -Anything else that concerns you

## 2023-10-05 NOTE — ED Triage Notes (Signed)
 Pt c/o R sided thoracic back pain for one week. Pt also endorses SOB.

## 2023-10-13 ENCOUNTER — Ambulatory Visit
Admission: RE | Admit: 2023-10-13 | Discharge: 2023-10-13 | Disposition: A | Discharge status: Home / Self Care | Payer: MEDICAID | Source: Ambulatory Visit

## 2023-10-28 ENCOUNTER — Other Ambulatory Visit: Payer: Self-pay | Admitting: Internal Medicine

## 2023-11-03 ENCOUNTER — Ambulatory Visit: Payer: MEDICAID

## 2023-11-26 ENCOUNTER — Emergency Department (HOSPITAL_COMMUNITY): Payer: MEDICAID

## 2023-11-26 ENCOUNTER — Other Ambulatory Visit: Payer: Self-pay

## 2023-11-26 ENCOUNTER — Encounter (HOSPITAL_COMMUNITY): Payer: Self-pay | Admitting: *Deleted

## 2023-11-26 ENCOUNTER — Emergency Department (HOSPITAL_COMMUNITY)
Admission: EM | Admit: 2023-11-26 | Discharge: 2023-11-27 | Disposition: A | Discharge status: Home / Self Care | Payer: MEDICAID | Attending: Emergency Medicine | Admitting: Emergency Medicine

## 2023-11-26 DIAGNOSIS — R079 Chest pain, unspecified: Secondary | ICD-10-CM | POA: Insufficient documentation

## 2023-11-26 DIAGNOSIS — F17210 Nicotine dependence, cigarettes, uncomplicated: Secondary | ICD-10-CM | POA: Insufficient documentation

## 2023-11-26 DIAGNOSIS — J40 Bronchitis, not specified as acute or chronic: Secondary | ICD-10-CM | POA: Insufficient documentation

## 2023-11-26 DIAGNOSIS — I10 Essential (primary) hypertension: Secondary | ICD-10-CM | POA: Insufficient documentation

## 2023-11-26 LAB — CBC
HCT: 42.4 % (ref 36.0–46.0)
Hemoglobin: 13.4 g/dL (ref 12.0–15.0)
MCH: 24.9 pg — ABNORMAL LOW (ref 26.0–34.0)
MCHC: 31.6 g/dL (ref 30.0–36.0)
MCV: 78.7 fL — ABNORMAL LOW (ref 80.0–100.0)
Platelets: 179 10*3/uL (ref 150–400)
RBC: 5.39 MIL/uL — ABNORMAL HIGH (ref 3.87–5.11)
RDW: 14.9 % (ref 11.5–15.5)
WBC: 3.6 10*3/uL — ABNORMAL LOW (ref 4.0–10.5)
nRBC: 0 % (ref 0.0–0.2)

## 2023-11-26 LAB — TROPONIN I (HIGH SENSITIVITY): Troponin I (High Sensitivity): 3 ng/L (ref <18)

## 2023-11-26 LAB — BASIC METABOLIC PANEL
Anion gap: 5 (ref 5–15)
BUN: 12 mg/dL (ref 6–20)
CO2: 24 mmol/L (ref 22–32)
Calcium: 8.8 mg/dL — ABNORMAL LOW (ref 8.9–10.3)
Chloride: 112 mmol/L — ABNORMAL HIGH (ref 98–111)
Creatinine, Ser: 0.82 mg/dL (ref 0.44–1.00)
GFR, Estimated: >60 mL/min (ref >60)
Glucose, Bld: 108 mg/dL — ABNORMAL HIGH (ref 70–99)
Potassium: 4.2 mmol/L (ref 3.5–5.1)
Sodium: 141 mmol/L (ref 135–145)

## 2023-11-26 LAB — HCG, SERUM, QUALITATIVE: Preg, Serum: NEGATIVE

## 2023-11-26 LAB — PROTIME-INR
INR: 2.4 — ABNORMAL HIGH (ref 0.8–1.2)
Prothrombin Time: 26.3 s — ABNORMAL HIGH (ref 11.4–15.2)

## 2023-11-26 NOTE — ED Triage Notes (Signed)
 History of clots in her legs and her lungs she is taking coumadin

## 2023-11-26 NOTE — ED Provider Triage Note (Signed)
 Emergency Medicine Provider Triage Evaluation Note  Michele Gillespie , a 48 y.o. female  was evaluated in triage.  Pt complains of cough. Symptoms began Thursday. No fevers. This morning she woke up with pain in her chest. It is worse with coughing. Does not feel like her previous Pes. She has been taking her Coumadin.  No recent travel or surgeries.  No pain or swelling in her legs or arms.  Review of Systems  Positive:  Negative:   Physical Exam  BP 128/87   Pulse 85   Temp 98.6 F (37 C)   Resp 17   Ht 5\' 4"  (1.626 m)   Wt 106 kg   SpO2 98%   BMI 40.11 kg/m  Gen:   Awake, no distress   Resp:  Normal effort  MSK:   Moves extremities without difficulty  Other:    Medical Decision Making  Medically screening exam initiated at 6:42 PM.  Appropriate orders placed.  Michele Gillespie was informed that the remainder of the evaluation will be completed by another provider, this initial triage assessment does not replace that evaluation, and the importance of remaining in the ED until their evaluation is complete.    Silva Bandy, PA-C 11/26/23 1843

## 2023-11-26 NOTE — ED Triage Notes (Signed)
 The pt reports that she has been coughing and burping a lot since last Thursday chest pain today no known temp

## 2023-11-27 MED ORDER — NAPROXEN 250 MG PO TABS: 500.0000 mg | ORAL_TABLET | Freq: Once | ORAL | Status: DC

## 2023-11-27 MED ORDER — BENZONATATE 100 MG PO CAPS: 100.0000 mg | ORAL_CAPSULE | Freq: Three times a day (TID) | ORAL | 0 refills | Status: DC

## 2023-11-27 MED ORDER — DOXYCYCLINE HYCLATE 100 MG PO CAPS: 100.0000 mg | ORAL_CAPSULE | Freq: Two times a day (BID) | ORAL | 0 refills | Status: AC

## 2023-11-27 MED ORDER — PREDNISONE 20 MG PO TABS
60.0000 mg | ORAL_TABLET | Freq: Once | ORAL | Status: AC
  Administered 2023-11-27: 60 mg via ORAL
  Filled 2023-11-27: qty 3

## 2023-11-27 NOTE — Discharge Instructions (Addendum)
 You were evaluated in the Emergency Department and after careful evaluation, we did not find any emergent condition requiring admission or further testing in the hospital.  Your exam/testing today is overall reassuring.  Symptoms likely due to bronchitis.  Recommend taking the Tessalon medication to suppress your cough.  Can continue the Mucinex medication at home.  Recommend Tylenol at home for discomfort.  Can start the doxycycline antibiotic in 2 or 3 days if you are not getting any relief with the other medicines.  Please return to the Emergency Department if you experience any worsening of your condition.   Thank you for allowing [place] to be a part of your care.
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
## 2023-11-27 NOTE — ED Notes (Signed)
 Patient was called twice when second trop was due, no answer

## 2023-11-27 NOTE — ED Notes (Signed)
 Questions and concerns addressed. Discharge teaching completed.   Prescriptions reviewed and pharmacy verified.   Pt ambulatory upon discharge.

## 2023-11-29 ENCOUNTER — Emergency Department (HOSPITAL_COMMUNITY)
Admission: EM | Admit: 2023-11-29 | Discharge: 2023-11-29 | Disposition: A | Discharge status: Home / Self Care | Payer: MEDICAID | Attending: Emergency Medicine | Admitting: Emergency Medicine

## 2023-11-29 DIAGNOSIS — Z7901 Long term (current) use of anticoagulants: Secondary | ICD-10-CM | POA: Insufficient documentation

## 2023-11-29 DIAGNOSIS — Z79899 Other long term (current) drug therapy: Secondary | ICD-10-CM | POA: Insufficient documentation

## 2023-11-29 DIAGNOSIS — R051 Acute cough: Secondary | ICD-10-CM | POA: Diagnosis not present

## 2023-11-29 DIAGNOSIS — Z720 Tobacco use: Secondary | ICD-10-CM | POA: Diagnosis not present

## 2023-11-29 DIAGNOSIS — R059 Cough, unspecified: Secondary | ICD-10-CM | POA: Diagnosis present

## 2023-11-29 DIAGNOSIS — I10 Essential (primary) hypertension: Secondary | ICD-10-CM | POA: Insufficient documentation

## 2023-11-29 MED ORDER — ALUM & MAG HYDROXIDE-SIMETH 200-200-20 MG/5ML PO SUSP
30.0000 mL | Freq: Once | ORAL | Status: AC
  Administered 2023-11-29: 30 mL via ORAL
  Filled 2023-11-29: qty 30

## 2023-11-29 MED ORDER — PANTOPRAZOLE SODIUM 20 MG PO TBEC: 20.0000 mg | DELAYED_RELEASE_TABLET | Freq: Every day | ORAL | 0 refills | Status: DC

## 2023-11-29 NOTE — ED Provider Notes (Signed)
 [place] EMERGENCY DEPARTMENT AT [facility] Provider Note   CSN: [id number] Arrival date & time: 11/29/23  1754     History  Chief Complaint  Patient presents with   Cough    Michele Gillespie is a 48 y.o. female.  The history is provided by the patient and medical records. No language interpreter was used.  Cough    49 year old female history of hypercoagulable state currently on Eliquis, GERD, hypertension, tobacco use, obesity, schizophrenia, bipolar presenting with complaints of a cough.  Patient report for the past week she has had a persistent cough along with pain in her chest.  Symptoms worse with swallowing, belching, and with movement.  Cough is nonproductive without any hemoptysis.  No fever or chills no lightheadedness or dizziness no nausea vomiting or diarrhea.  She reportedly was evaluated in the ER 7 days ago for her complaint.  She was prescribed antibiotic, doxycycline along with some cough medication.  She just thought taking the medication but noticed no improvement prompting this ER visit.  She is compliant with her blood thinner medication.  Home Medications Prior to Admission medications   Medication Sig Start Date End Date Taking? Authorizing Provider  albuterol (VENTOLIN HFA) 108 (90 Base) MCG/ACT inhaler INHALE 1-2 PUFFS BY MOUTH EVERY 6 HOURS AS NEEDED FOR WHEEZE OR SHORTNESS OF BREATH 10/28/23   Katheran James, DO  amLODipine (NORVASC) 5 MG tablet Take 1 tablet (5 mg total) by mouth daily. 09/29/23   Champ Mungo, DO  ARIPiprazole (ABILIFY) 10 MG tablet Take 1 tablet (10 mg total) by mouth daily. Patient not taking: Reported on 10/05/2023 05/21/21   Charm Rings, NP  benzonatate (TESSALON) 100 MG capsule Take 1 capsule (100 mg total) by mouth every 8 (eight) hours. 11/27/23   Sabas Sous, MD  CHLOROQUINE PHOSPHATE PO Take 1 tablet by mouth daily.    [provider]  citalopram (CELEXA) 10 MG tablet Take 1 tablet (10 mg total) by  mouth daily. Patient not taking: Reported on 10/05/2023 09/26/21 10/22/23  Verdene Lennert, MD  diphenhydrAMINE (BENADRYL) 25 MG tablet Take 1 tablet (25 mg total) by mouth every 6 (six) hours for 20 doses. Patient not taking: Reported on 10/05/2023 03/04/23 10/05/23  Jeanelle Malling, PA  divalproex (DEPAKOTE) 500 MG DR tablet Take 1 tablet (500 mg total) by mouth 2 (two) times daily. Patient not taking: Reported on 10/05/2023 09/26/21 10/22/23  Verdene Lennert, MD  doxycycline (VIBRAMYCIN) 100 MG capsule Take 1 capsule (100 mg total) by mouth 2 (two) times daily for 10 days. 11/27/23 12/07/23  Sabas Sous, MD  fluticasone (FLONASE) 50 MCG/ACT nasal spray Place 1 spray into both nostrils daily. Patient not taking: Reported on 10/05/2023 12/21/21   Gustavus Bryant, FNP  linaclotide [facility]) 72 MCG capsule Take 1 capsule (72 mcg total) by mouth daily before breakfast. Patient not taking: Reported on 10/05/2023 09/29/23   Champ Mungo, DO  loratadine (CLARITIN) 10 MG tablet Take 1 tablet (10 mg total) by mouth daily as needed for allergies. Patient not taking: Reported on 10/05/2023 09/29/23   Champ Mungo, DO  methocarbamol (ROBAXIN) 500 MG tablet Take 1 tablet (500 mg total) by mouth 2 (two) times daily. 10/05/23   Loetta Rough, MD  omeprazole (PRILOSEC OTC) 20 MG tablet Take 1 tablet (20 mg total) by mouth in the morning and at bedtime. 09/29/23 09/28/24  Champ Mungo, DO  warfarin (COUMADIN) 10 MG tablet Take one (1) tablet only on Mondays. All  other days, take one and one half (1&1/2) tablets. Patient taking differently: Take 10-15 mg by mouth See admin instructions. Take 15 mg by mouth once a day on Sun/Tues/Wed/Thurs/Fri/Sat and 10 mg on Mon 09/08/23   Elicia Lamp, RPH-CPP      Allergies    Patient has no known allergies.    Review of Systems   Review of Systems  Respiratory:  Positive for cough.   All other systems reviewed and are negative.   Physical Exam Updated Vital Signs BP 128/88 (BP Location: Right  Arm)   Pulse 99   Temp 98.8 F (37.1 C) (Oral)   Resp 18   SpO2 96%  Physical Exam Vitals and nursing note reviewed.  Constitutional:      General: She is not in acute distress.    Appearance: She is well-developed.  HENT:     Head: Atraumatic.  Eyes:     Conjunctiva/sclera: Conjunctivae normal.  Cardiovascular:     Rate and Rhythm: Normal rate and regular rhythm.     Pulses: Normal pulses.     Heart sounds: Normal heart sounds.  Pulmonary:     Effort: Pulmonary effort is normal.     Breath sounds: No wheezing, rhonchi or rales.  Chest:     Chest wall: No tenderness.  Abdominal:     Palpations: Abdomen is soft.  Musculoskeletal:     Cervical back: Neck supple.  Skin:    Findings: No rash.  Neurological:     Mental Status: She is alert. Mental status is at baseline.  Psychiatric:        Mood and Affect: Mood normal.     ED Results / Procedures / Treatments   Labs (all labs ordered are listed, but only abnormal results are displayed) Labs Reviewed - No data to display  EKG None ED ECG REPORT   Date: 11/29/2023  Rate: 103  Rhythm: sinus tach  QRS Axis: normal  Intervals: normal  ST/T Wave abnormalities: normal  Conduction Disutrbances:none  Narrative Interpretation:   Old EKG Reviewed: unchanged  I have personally reviewed the EKG tracing and agree with the computerized printout as noted.   Radiology No results found.  Procedures Procedures    Medications Ordered in ED Medications  alum & mag hydroxide-simeth (MAALOX/MYLANTA) 200-200-20 MG/5ML suspension 30 mL (has no administration in time range)    ED Course/ Medical Decision Making/ A&P                                 Medical Decision Making Risk OTC drugs.   BP 128/88 (BP Location: Right Arm)   Pulse 99   Temp 98.8 F (37.1 C) (Oral)   Resp 18   SpO2 96%   49:30 PM  48 year old female history of hypercoagulable state currently on Eliquis, GERD, hypertension, tobacco use, obesity,  schizophrenia, bipolar presenting with complaints of a cough.  Patient report for the past week she has had a persistent cough along with pain in her chest.  Symptoms worse with swallowing, belching, and with movement.  Cough is nonproductive without any hemoptysis.  No fever or chills no lightheadedness or dizziness no nausea vomiting or diarrhea.  She reportedly was evaluated in the ER 7 days ago for her complaint.  She was prescribed antibiotic, doxycycline along with some cough medication.  She just thought taking the medication but noticed no improvement prompting this ER visit.  She is compliant  with her blood thinner medication.  Exam overall reassuring patient resting comfortably appears to be in no acute discomfort.  No reports producible chest wall tenderness.  EKG independently viewed interpreted by me shows some mild sinus tachycardia with heart rate of 103 but overall no ischemic changes.  EMR review patient has been seen and evaluated recently for the same complaint.  Cardiac workup at that time was unremarkable.  She is also is compliant with her anticoagulant therefore I have low suspicion for PE causing her symptoms.  Her symptom is suggestive of gastritis/GERD.  Will provide supportive care.  She is currently taking antibiotic that and I do not think repeating chest x-ray is indicated at this time as she is just started on her antibiotic.  Her vital sign is normal and reassuring.  Return precaution given.  Social determinant of health including tobacco use, social isolation, and stress.           Final Clinical Impression(s) / ED Diagnoses Final diagnoses:  Acute cough    Rx / DC Orders ED Discharge Orders          Ordered    pantoprazole (PROTONIX) 20 MG tablet  Daily        11/29/23 2125              Fayrene Helper, PA-C 11/29/23 2125    Charlynne Pander, MD 11/29/23 2250

## 2023-11-29 NOTE — Discharge Instructions (Signed)
 Please continue taking antibiotic recently prescribed as well as cough medication recently prescribed as treatment of your symptoms.  You may also take Protonix as heartburning can aggravate cough.  Follow-up closely with your doctor for further care.  Return if you have any concern.

## 2023-11-29 NOTE — ED Triage Notes (Signed)
 Pt here from home with a c continued cough , was just seen two days and had a cp work up , pt just started her antibiotics on Friday

## 2023-12-02 ENCOUNTER — Other Ambulatory Visit: Payer: Self-pay | Admitting: Student

## 2023-12-29 ENCOUNTER — Encounter: Payer: MEDICAID | Admitting: Student

## 2024-01-06 ENCOUNTER — Ambulatory Visit: Payer: MEDICAID | Admitting: Neurology

## 2024-01-23 ENCOUNTER — Other Ambulatory Visit: Payer: Self-pay | Admitting: Student

## 2024-01-23 NOTE — Telephone Encounter (Signed)
 Medication sent to pharmacy

## 2024-01-26 ENCOUNTER — Ambulatory Visit: Payer: MEDICAID | Admitting: Pharmacist

## 2024-01-26 ENCOUNTER — Encounter: Payer: Self-pay | Admitting: Obstetrics and Gynecology

## 2024-01-26 ENCOUNTER — Encounter: Payer: MEDICAID | Admitting: Obstetrics and Gynecology

## 2024-01-26 ENCOUNTER — Ambulatory Visit: Payer: MEDICAID | Admitting: Obstetrics and Gynecology

## 2024-01-26 VITALS — BP 128/90 | HR 67 | Ht 64.5 in | Wt 227.0 lb

## 2024-01-26 DIAGNOSIS — Z7901 Long term (current) use of anticoagulants: Secondary | ICD-10-CM

## 2024-01-26 DIAGNOSIS — D6859 Other primary thrombophilia: Secondary | ICD-10-CM

## 2024-01-26 DIAGNOSIS — N888 Other specified noninflammatory disorders of cervix uteri: Secondary | ICD-10-CM | POA: Diagnosis not present

## 2024-01-26 LAB — POCT INR: INR: 3.3 — AB (ref 2.0–3.0)

## 2024-01-26 MED ORDER — WARFARIN SODIUM 10 MG PO TABS: ORAL_TABLET | ORAL | 3 refills | Status: DC

## 2024-01-26 NOTE — Patient Instructions (Signed)
 Patient instructed to take medications as defined in the Anti-coagulation Track section of this encounter.  Patient instructed to take today's dose.  Patient instructed to take  one-and-one-half (1 & 1/2) of your 10 milligram strength, white, warfarin tablets by mouth, every day EXCEPT on MONDAYS and THURSDAYS. On MONDAYS and THURSDAYS, take ONLY one (1) tablet. Patient verbalized understanding of these instructions.

## 2024-01-26 NOTE — Progress Notes (Signed)
  CC: cervical mass Subjective:    Patient ID: Michele Gillespie, female    DOB: 10/08/1975, 48 y.o.   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  Last pap smear was normal  Pt declined exam.  She had questions regarding fertility as well.  Pt advised with age of 24, hx of bilateral tubal ligation, hx of 5 c sections, and hx of DVT with chronic anticoagulation she would be extremely high risk.     Review of Systems     Objective:   Physical Exam Vitals:   01/26/24 1551  BP: (!) 128/90  Pulse: 67         Assessment & Plan:   1. 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  Last pap WNL.  I spent 15 minutes dedicated to the care of this patient including previsit review of records, face to face time with the patient discussing  and post visit testing.     Abigail Abler, MD Faculty Attending, [facility]
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
## 2024-02-02 ENCOUNTER — Encounter: Payer: MEDICAID | Admitting: Student

## 2024-02-02 ENCOUNTER — Encounter: Payer: Self-pay | Admitting: Anesthesiology

## 2024-02-03 ENCOUNTER — Telehealth: Payer: Self-pay | Admitting: Neurology

## 2024-02-03 ENCOUNTER — Telehealth: Payer: Self-pay

## 2024-02-03 ENCOUNTER — Ambulatory Visit (INDEPENDENT_AMBULATORY_CARE_PROVIDER_SITE_OTHER): Payer: MEDICAID | Admitting: Neurology

## 2024-02-03 ENCOUNTER — Encounter: Payer: Self-pay | Admitting: Neurology

## 2024-02-03 VITALS — BP 126/86 | HR 85 | Ht 64.5 in | Wt 228.0 lb

## 2024-02-03 DIAGNOSIS — G4733 Obstructive sleep apnea (adult) (pediatric): Secondary | ICD-10-CM

## 2024-02-03 NOTE — Patient Instructions (Signed)
 I will order a new home sleep study to hopefully qualify for a new CPAP machine.

## 2024-02-03 NOTE — Telephone Encounter (Signed)
Patient called to verify appointment 

## 2024-02-03 NOTE — Telephone Encounter (Signed)
-----   Message from Wess Hammed sent at 02/03/2024 10:42 AM EDT ----- Can we check with DME to see if we can get a download? I am going to order new HST to hopefully get new CPAP, hers is 50 years old

## 2024-02-03 NOTE — Telephone Encounter (Signed)
 Sarah,  Below is what I see in resmed non complaince:
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
## 2024-03-01 ENCOUNTER — Ambulatory Visit: Payer: MEDICAID

## 2024-03-04 ENCOUNTER — Other Ambulatory Visit: Payer: Self-pay | Admitting: Student

## 2024-03-04 NOTE — Telephone Encounter (Unsigned)
 Copied from CRM [id number]. Topic: Clinical - Medication Refill >> Mar 04, 2024  2:43 PM Carrielelia G wrote: Medication: pantoprazole  (PROTONIX ) 20 MG tablet    Has the patient contacted their pharmacy? No (Agent: If no, request that the patient contact the pharmacy for the refill. If patient does not wish to contact the pharmacy document the reason why and proceed with request.) (Agent: If yes, when and what did the pharmacy advise?)  This is the patient's preferred pharmacy:  [id number] [place], [place] - [address] AT [facility] [address] [place] [address] Phone: [phone] Fax: [phone]  Is this the correct pharmacy for this prescription? Yes If no, delete pharmacy and type the correct one.   Has the prescription been filled recently? No  Is the patient out of the medication? Yes  Has the patient been seen for an appointment in the last year OR does the patient have an upcoming appointment? Yes  Can we respond through MyChart? Yes  Agent: Please be advised that Rx refills may take up to 3 business days. We ask that you follow-up with your pharmacy.

## 2024-03-05 MED ORDER — PANTOPRAZOLE SODIUM 20 MG PO TBEC
20.0000 mg | DELAYED_RELEASE_TABLET | Freq: Every day | ORAL | 0 refills | Status: DC
Start: 2024-03-05 — End: 2024-03-29

## 2024-03-08 ENCOUNTER — Telehealth: Payer: Self-pay

## 2024-03-08 NOTE — Telephone Encounter (Signed)
 Pt LVM on sleep lab phone to schedule home sleep study - Sansa

## 2024-03-29 ENCOUNTER — Other Ambulatory Visit: Payer: Self-pay | Admitting: Student

## 2024-03-29 NOTE — Telephone Encounter (Signed)
 Medication sent to pharmacy

## 2024-04-20 ENCOUNTER — Other Ambulatory Visit: Payer: Self-pay

## 2024-04-20 ENCOUNTER — Encounter: Payer: Self-pay | Admitting: Student

## 2024-04-20 ENCOUNTER — Ambulatory Visit: Payer: MEDICAID | Admitting: Student

## 2024-04-20 VITALS — BP 116/83 | HR 92 | Temp 99.1°F | Ht 64.0 in | Wt 233.6 lb

## 2024-04-20 DIAGNOSIS — G4733 Obstructive sleep apnea (adult) (pediatric): Secondary | ICD-10-CM

## 2024-04-20 DIAGNOSIS — E785 Hyperlipidemia, unspecified: Secondary | ICD-10-CM

## 2024-04-20 DIAGNOSIS — R7303 Prediabetes: Secondary | ICD-10-CM

## 2024-04-20 DIAGNOSIS — I10 Essential (primary) hypertension: Secondary | ICD-10-CM

## 2024-04-20 DIAGNOSIS — Z7901 Long term (current) use of anticoagulants: Secondary | ICD-10-CM

## 2024-04-20 DIAGNOSIS — K581 Irritable bowel syndrome with constipation: Secondary | ICD-10-CM

## 2024-04-20 DIAGNOSIS — K589 Irritable bowel syndrome without diarrhea: Secondary | ICD-10-CM

## 2024-04-20 LAB — POCT GLYCOSYLATED HEMOGLOBIN (HGB A1C): Hemoglobin A1C: 6 % — AB (ref 4.0–5.6)

## 2024-04-20 LAB — GLUCOSE, CAPILLARY: Glucose-Capillary: 88 mg/dL (ref 70–99)

## 2024-04-20 MED ORDER — LINACLOTIDE 72 MCG PO CAPS: 72.0000 ug | ORAL_CAPSULE | Freq: Every day | ORAL | 2 refills | Status: DC

## 2024-04-20 MED ORDER — TIRZEPATIDE-WEIGHT MANAGEMENT 2.5 MG/0.5ML ~~LOC~~ SOLN: 2.5000 mg | SUBCUTANEOUS | 3 refills | Status: DC
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
## 2024-04-20 NOTE — Assessment & Plan Note (Signed)
 Refilled Linzess , has been doing well on this.

## 2024-04-20 NOTE — Assessment & Plan Note (Addendum)
 Follows with Dr. Ruthell, last INR 3.3, on 01/26/24, above goal of 2-3. Query if she can be switched to a DOAC, as she is on this with her DVT in 2010, and PE in 08/2005 thought to be secondary to protein C and Protein S. Will recheck her INR today and discuss with Dr. Ruthell on adjustments if her warfarin if needed.

## 2024-04-20 NOTE — Assessment & Plan Note (Addendum)
 BP today 116/83 . Takes amlodipine  5mg , will continue.

## 2024-04-20 NOTE — Assessment & Plan Note (Signed)
 Lab Results  Component Value Date   HGBA1C 6.0 (A) 04/20/2024   HGBA1C 6.3 (H) 09/29/2023    Last A1c checked was 6.3, most recent 6.0. Given her conurrent morbid obesity as well as OSA, she would benefit from a GLP-1 such as Zepbound . Will order and titrate up slowly if pt is tolerating well.

## 2024-04-20 NOTE — Assessment & Plan Note (Signed)
 Last LDL 127 checked in December of 2024, her ASCVD risk is 5.4%. Will recheck another one today and she has been increasing her exercise and working on her diet.

## 2024-04-20 NOTE — Patient Instructions (Signed)
 Thank you so much for coming to the clinic today!   I have sent in the weight loss medication, called Zepbound . Please let me know how you do on it in about 4 weeks We are rechecking your sugar, your cholesterol, as well as your INR today, I will let you know if there are any changes that need to be made.   If you have any questions please feel free to the call the clinic at anytime at [phone]. It was a pleasure seeing you!  Best, Dr. Wandra Babin

## 2024-04-21 ENCOUNTER — Telehealth: Payer: Self-pay | Admitting: *Deleted

## 2024-04-21 ENCOUNTER — Ambulatory Visit: Payer: Self-pay | Admitting: Student

## 2024-04-21 ENCOUNTER — Other Ambulatory Visit: Payer: Self-pay | Admitting: Student

## 2024-04-21 LAB — LIPID PANEL
Chol/HDL Ratio: 4.6 ratio — ABNORMAL HIGH (ref 0.0–4.4)
Cholesterol, Total: 202 mg/dL — ABNORMAL HIGH (ref 100–199)
HDL: 44 mg/dL (ref >39)
LDL Chol Calc (NIH): 136 mg/dL — ABNORMAL HIGH (ref 0–99)
Triglycerides: 120 mg/dL (ref 0–149)
VLDL Cholesterol Cal: 22 mg/dL (ref 5–40)

## 2024-04-21 LAB — PROTIME-INR
INR: 2.3 — ABNORMAL HIGH (ref 0.9–1.2)
Prothrombin Time: 23.9 s — ABNORMAL HIGH (ref 9.1–12.0)

## 2024-04-21 MED ORDER — ZEPBOUND 2.5 MG/0.5ML ~~LOC~~ SOAJ: 2.5000 mg | SUBCUTANEOUS | 3 refills | Status: DC

## 2024-04-21 NOTE — Progress Notes (Signed)
 Internal Medicine Clinic Attending  Case discussed with the resident at the time of the visit.  We reviewed the resident's history and exam and pertinent patient test results.  I agree with the assessment, diagnosis, and plan of care documented in the resident's note.   ASCVD risk now 6.2%.   Will need to do shared decision making with patient re starting statin. Since we're starting Zepbound , I think it would be reasonable to repeat lipids in another 3-6 months while on Zepbound  if she doesn't want to start another medicine today.

## 2024-04-21 NOTE — Telephone Encounter (Signed)
 Dr.Nooruddin before I submit the PA can you change the rx for the box instead of the vials thanks!

## 2024-04-21 NOTE — Telephone Encounter (Signed)
 Will forward to Jada, CMA to do PA. Copied from CRM #[id number]. Topic: Clinical - Medication Prior Auth >> Apr 21, 2024  2:12 PM Fredrica W wrote: Reason for CRM: Patient called. States insurance requires Prior authorization required for tirzepatide  (ZEPBOUND ) 2.5 MG/0.5ML injection vial. Thank You

## 2024-04-22 ENCOUNTER — Telehealth: Payer: Self-pay

## 2024-04-22 NOTE — Telephone Encounter (Signed)
 Prior Authorization for patient (Zepbound  2.5MG /0.5ML pen-injectors) came through on cover my meds was submitted with last office notes awaiting approval or denial.  KEY: [id number]

## 2024-04-22 NOTE — Telephone Encounter (Signed)
 Michele Gillespie (Key: [id number]) Zepbound  2.5MG /0.5ML pen-injectors Form [employer] Medicaid Electronic Prior Authorization Form Created Sent to Plan Plan Response Submit Clinical Questions Determination Unfavorable less than a minute ago Message from Plan Denied  Awaiting additional information on why this request has been denied.

## 2024-04-22 NOTE — Telephone Encounter (Unsigned)
 Copied from CRM [id number]. Topic: Clinical - Prescription Issue >> Apr 22, 2024  2:43 PM Michele Gillespie wrote: Reason for CRM: Patient called in stating that she went to go pick up her Zepbound  that she was prescribed and the pharmacy told her that Dr. Nelia needs to obtain an insurance approval before they can fill the prescription.

## 2024-04-23 NOTE — Telephone Encounter (Signed)
 Per patients insurance the patient must have tried or failed the preferred drug Wegovy for at least 3 to 6 months or have a medical reason why you cannot use Wegovy.  Patient is aware.

## 2024-04-25 ENCOUNTER — Other Ambulatory Visit: Payer: Self-pay | Admitting: Pharmacist

## 2024-04-27 ENCOUNTER — Ambulatory Visit (INDEPENDENT_AMBULATORY_CARE_PROVIDER_SITE_OTHER): Payer: MEDICAID

## 2024-04-27 DIAGNOSIS — G4731 Primary central sleep apnea: Secondary | ICD-10-CM

## 2024-04-27 DIAGNOSIS — G4733 Obstructive sleep apnea (adult) (pediatric): Secondary | ICD-10-CM | POA: Diagnosis not present

## 2024-04-28 ENCOUNTER — Telehealth: Payer: Self-pay | Admitting: *Deleted

## 2024-04-28 NOTE — Telephone Encounter (Signed)
 Copied from CRM [id number]. Topic: Clinical - Prescription Issue >> Apr 28, 2024  9:28 AM Farrel B wrote: Reason for CRM: Received call from patient [phone] , patient stated that she had reached out to the pharmacy again about the tirzepatide  (ZEPBOUND ) 2.5 MG/0.5ML Pen, patient stated she just spoke to them at her primary pharmacy and was advised that she needed to inform the provider the insurance will not pay for the Zepbound , she advised the pharmacy told her the [facility] would be covered by insurance. I asked the patient was it due to prior authorization, she stated the pharmacy informed her it would not be paid for and she needed a new script for the [facility]. Please call patient at [phone] to advised.

## 2024-04-28 NOTE — Progress Notes (Unsigned)
 [name]

## 2024-04-29 NOTE — Procedures (Signed)
 [facility]  HOME SLEEP TEST (Watch PAT) REPORT  STUDY DATE: 04/28/24  DOB: 29-Jan-1976  MRN: [id number]  ORDERING CLINICIAN: True Mar, MD, PhD   REFERRING CLINICIAN: Lauraine Born, NP  CLINICAL INFORMATION/HISTORY: 48 year old right-handed woman with an underlying medical history of mood disorder, DVT, memory loss, history of pulmonary embolism, hypertension, asthma, anemia, and obesity, who presents for evaluation of her OSA. She has been on autoPAP therapy with good tolerance and good symptom control. She should be eligible for a new machine.   BMI: 38.5 kg/m  FINDINGS:   Sleep Summary:   Total Recording Time (hours, min): 7 hours, 32 min  Total Sleep Time (hours, min):  6 hours, 20 min  Percent REM (%):    26.5%   Respiratory Indices:   Calculated pAHI (per hour):  50.3/hour         REM pAHI:    62.4/hour       NREM pAHI: 46.3/hour  Central pAHI: 9.2/hour  Oxygen Saturation Statistics:    Oxygen Saturation (%) Mean: 92%   Minimum oxygen saturation (%):                 81%   O2 Saturation Range (%): 81 - 99%    O2 Saturation (minutes) <=88%: 13.8 min  Pulse Rate Statistics:   Pulse Mean (bpm):    72/min    Pulse Range (52 - 120/min)   IMPRESSION: OSA (obstructive sleep apnea), severe Central Sleep Apnea    RECOMMENDATION:  This home sleep test demonstrates severe obstructive sleep apnea with a total AHI of 50.3/hour and O2 nadir of 81% with significant time below or at 88% saturation of over 13 minutes for the study. There was a mild central sleep apnea component as well. Snoring was detected, in the mild to loud range. Ongoing treatment with positive airway pressure is highly recommended. The patient should be eligible for a new autoPAP machine. I recommend keeping her settings the same, so long as she has adequate apnea control per download and tolerates treatment. A laboratory attended titration study can be considered in the  future for optimization of treatment settings and to improve tolerance and compliance, if needed, down the road. Alternative treatment options are limited secondary to the severity of the patient's sleep disordered breathing, but may include surgical treatment with an implantable hypoglossal nerve stimulator (in carefully selected candidates, meeting criteria).  Concomitant weight loss is recommended (where clinically appropriate). Please note, that untreated obstructive sleep apnea may carry additional perioperative morbidity. Patients with significant obstructive sleep apnea should receive perioperative PAP therapy and the surgeons and particularly the anesthesiologist should be informed of the diagnosis and the severity of the sleep disordered breathing. The patient should be cautioned not to drive, work at heights, or operate dangerous or heavy equipment when tired or sleepy. Review and reiteration of good sleep hygiene measures should be pursued with any patient. Other causes of the patient's symptoms, including circadian rhythm disturbances, an underlying mood disorder, medication effect and/or an underlying medical problem cannot be ruled out based on this test. Clinical correlation is recommended.  The patient and her referring provider will be notified of the test results. The patient will be seen in follow up in sleep clinic at [facility].  I certify that I have reviewed the raw data recording prior to the issuance of this report in accordance with the standards of the American Academy of Sleep Medicine (AASM).    INTERPRETING PHYSICIAN:   True Mar,  MD, PhD Medical Director, [facility]) Diplomat, ABPN (Neurology and Sleep)   [facility] [address], [address] [place], [place] [address] [phone]

## 2024-05-03 ENCOUNTER — Telehealth: Payer: Self-pay | Admitting: Neurology

## 2024-05-03 DIAGNOSIS — G4733 Obstructive sleep apnea (adult) (pediatric): Secondary | ICD-10-CM

## 2024-05-03 NOTE — Telephone Encounter (Signed)
 Please call, I last saw her in May 2025 for OSA, having difficulty with her CPAP machine maintaining power, 48 years old, HST ordered for new CPAP machine/reevaluate OSA.  Had HST 04/28/2024 showing severe obstructive sleep apnea with a total AHI of 50.3/hour and O2 nadir of 81% with significant time below or at 88% saturation of over 13 minutes for the study. There was a mild central sleep apnea component as well.  I will write for a new CPAP machine with her same settings. I found her info in Res Med. 5-11 cm water, EPR 2. Needs revisit 31-90 days after starting new CPAP. Thanks

## 2024-05-03 NOTE — Telephone Encounter (Signed)
 Called and spoke to pt, relayed , results, recommendations. Sent order to dme thru [facility] msg:  Sent to DME [facility]  [phone]

## 2024-05-04 ENCOUNTER — Telehealth: Payer: Self-pay | Admitting: *Deleted

## 2024-05-04 ENCOUNTER — Telehealth: Payer: Self-pay

## 2024-05-04 MED ORDER — SEMAGLUTIDE-WEIGHT MANAGEMENT 0.25 MG/0.5ML ~~LOC~~ SOAJ: 0.2500 mg | SUBCUTANEOUS | 0 refills | Status: AC

## 2024-05-04 MED ORDER — SEMAGLUTIDE-WEIGHT MANAGEMENT 2.4 MG/0.75ML ~~LOC~~ SOAJ: 2.4000 mg | SUBCUTANEOUS | 3 refills | Status: DC

## 2024-05-04 MED ORDER — SEMAGLUTIDE-WEIGHT MANAGEMENT 1 MG/0.5ML ~~LOC~~ SOAJ: 1.0000 mg | SUBCUTANEOUS | 0 refills | Status: DC

## 2024-05-04 MED ORDER — SEMAGLUTIDE-WEIGHT MANAGEMENT 0.5 MG/0.5ML ~~LOC~~ SOAJ: 0.5000 mg | SUBCUTANEOUS | 0 refills | Status: DC

## 2024-05-04 MED ORDER — SEMAGLUTIDE-WEIGHT MANAGEMENT 1.7 MG/0.75ML ~~LOC~~ SOAJ: 1.7000 mg | SUBCUTANEOUS | 0 refills | Status: DC

## 2024-05-04 NOTE — Telephone Encounter (Signed)
 Copied from CRM [id number]. Topic: Clinical - Prescription Issue >> May 04, 2024  2:13 PM Marda MATSU wrote: Patient Market, received a notification from: [id number] [place], [place] - [address] [facility] [address] [place] [address] Phone: [phone] Fax: [phone] Hours: Not open 24 hours       that the Medication: Semaglutide -Weight Management 0.25 MG/0.5ML EMMANUEL [[id number]]  requires authorization from insurance company.  Please advise.

## 2024-05-04 NOTE — Telephone Encounter (Signed)
 Prior authorization has been submitted awaiting approval or denial. Please see my note from today 05/04/24.

## 2024-05-04 NOTE — Telephone Encounter (Signed)
 Apparently prior authorization is required for Zepbound .  Will try Wegovy  first.  Meds ordered this encounter  Medications   Semaglutide -Weight Management 0.25 MG/0.5ML SOAJ    Sig: Inject 0.25 mg into the skin once a week for 28 days.    Dispense:  2 mL    Refill:  0   Semaglutide -Weight Management 0.5 MG/0.5ML SOAJ    Sig: Inject 0.5 mg into the skin once a week for 28 days.    Dispense:  2 mL    Refill:  0   Semaglutide -Weight Management 1 MG/0.5ML SOAJ    Sig: Inject 1 mg into the skin once a week for 28 days.    Dispense:  2 mL    Refill:  0   Semaglutide -Weight Management 1.7 MG/0.75ML SOAJ    Sig: Inject 1.7 mg into the skin once a week for 28 days.    Dispense:  3 mL    Refill:  0   Semaglutide -Weight Management 2.4 MG/0.75ML SOAJ    Sig: Inject 2.4 mg into the skin once a week.    Dispense:  3 mL    Refill:  3   Ozell Kung MD 05/04/2024, 2:01 PM

## 2024-05-04 NOTE — Telephone Encounter (Signed)
 Prior Authorization for patient (Wegovy  0.25MG /0.5ML auto-injectors) came through on cover my meds was submitted with last office notes awaiting approval or denial.  XZB:[id number]

## 2024-05-05 NOTE — Telephone Encounter (Signed)
 Michele Gillespie (Key: [id number]) Rx #: [id number] Wegovy  0.25MG /0.5ML auto-injectors Form [employer] Medicaid Electronic Prior Authorization Form Created Sent to Plan Plan Response Submit Clinical Questions Determination Your prior authorization for Wegovy  has been approved! More Info Personalized support and financial assistance may be available through the [employer] program. For more information, and to see program requirements, click on the More Info button to the right.  Message from plan: Approved. WEGOVY  0.25MG /0.5ML Soln Auto-inj is approved from 05/04/2024 to 11/04/2024. All strengths of the drug are approved.. Authorization Expiration Date: November 04, 2024.   Lvm for the patient regarding the approval.

## 2024-05-05 NOTE — Telephone Encounter (Unsigned)
 Copied from CRM #[id number]. Topic: Clinical - Prescription Issue >> May 05, 2024 12:30 PM Farrel B wrote: Reason for CRM: [id number] patient [name] has called in regard to a message that was sent by Ms. Jada this morning, advising that the her prescription/PA had been approved for her WeGovy , Patient stated she was told to come to the pharmacy to pick up the medication once she got there they advised her they were still awaiting PA. Patient states she needed to speak with Ms. Jada to see what was going on because she has to go to work tonight for 7 and she's out in the weather trying to figure out what's going on .

## 2024-05-05 NOTE — Telephone Encounter (Signed)
 [facility] is [facility] and they have received the PA and getting Wegovy  ready for her.

## 2024-05-24 ENCOUNTER — Telehealth: Payer: Self-pay | Admitting: Pharmacist

## 2024-05-24 ENCOUNTER — Ambulatory Visit (INDEPENDENT_AMBULATORY_CARE_PROVIDER_SITE_OTHER): Payer: MEDICAID | Admitting: Pharmacist

## 2024-05-24 ENCOUNTER — Ambulatory Visit: Payer: MEDICAID | Admitting: Student

## 2024-05-24 ENCOUNTER — Other Ambulatory Visit: Payer: Self-pay | Admitting: Pharmacist

## 2024-05-24 ENCOUNTER — Encounter: Payer: Self-pay | Admitting: Student

## 2024-05-24 ENCOUNTER — Other Ambulatory Visit: Payer: Self-pay

## 2024-05-24 VITALS — BP 138/80 | HR 72 | Temp 98.0°F | Ht 64.0 in | Wt 229.8 lb

## 2024-05-24 DIAGNOSIS — Z7901 Long term (current) use of anticoagulants: Secondary | ICD-10-CM

## 2024-05-24 DIAGNOSIS — M25552 Pain in left hip: Secondary | ICD-10-CM | POA: Diagnosis not present

## 2024-05-24 DIAGNOSIS — D6859 Other primary thrombophilia: Secondary | ICD-10-CM

## 2024-05-24 DIAGNOSIS — Z6841 Body Mass Index (BMI) 40.0 and over, adult: Secondary | ICD-10-CM | POA: Diagnosis not present

## 2024-05-24 DIAGNOSIS — F3132 Bipolar disorder, current episode depressed, moderate: Secondary | ICD-10-CM

## 2024-05-24 LAB — POCT INR: INR: 3 (ref 2.0–3.0)

## 2024-05-24 MED ORDER — DICLOFENAC SODIUM 1 % EX GEL: 4.0000 g | Freq: Four times a day (QID) | CUTANEOUS | 3 refills | Status: DC

## 2024-05-24 MED ORDER — WARFARIN SODIUM 10 MG PO TABS: ORAL_TABLET | ORAL | 3 refills | Status: DC

## 2024-05-24 NOTE — Assessment & Plan Note (Addendum)
 She is in the midst of titration of semaglutide  to maximum dose.  Currently on 0.25 weekly, next week will start 0.5 mg weekly.  Denies acute side effects.  Comorbidities include OSA. - Continue semaglutide  titration to maximum dose monthly
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
## 2024-05-24 NOTE — Telephone Encounter (Signed)
 Requests refill. Refill authorization sent.

## 2024-05-24 NOTE — Patient Instructions (Addendum)
 Patient instructed to take medications as defined in the Anti-coagulation Track section of this encounter.  Patient instructed to take today's dose.  Patient instructed to take only one (1) of your 10 mg strength, white colored warfarin tablets on MONDAYS, WEDNESDAYS and FRIDAYS. ALL OTHER DAYS, Sundays, Tuesdays, Thursdays and Saturdays, take one-and-one-half (1 & 1/2) of your 10 mg strength white colored warfarin tablets.  Patient verbalized understanding of these instructions.

## 2024-05-24 NOTE — Assessment & Plan Note (Signed)
 She would like evaluation of left hip pain that has been ongoing for several years, but worsening lately.  She feels it is a stabbing pain deep in the joint, worse with movement and walking.  Walks with a slight limp due to this.  Duration estimated to be 8 years.  There is no point tenderness on the lateral aspect of the hip.  No recent trauma.  Sensation and strength are intact.  Differential osteoarthritis, avascular necrosis, bursitis, ligament injury.  Do not suspect bursitis or recent trauma given her exam.  She does not have tenderness at the greater trochanter.  Duration most consistent with osteoarthritis, most recent imaging in 2022 was negative.  She has a history of lower extremity pain.  Has received injections in the opposite hip before for bursitis as well as the ipsilateral knee.  In the past she was also told she would need a hip replacement, although her imaging does not reflect such as serious disease.  - Repeat left hip x-ray today.  Prior was normal in 2022 - Tylenol  to max dose, up to 4 g/day, presently she is consuming approximately 2 g - Voltaren  gel sent to pharmacy - May benefit from PT, but busy schedule with work and education prevents this - Consider orthopedic referral for injection based on results of XR

## 2024-05-24 NOTE — Assessment & Plan Note (Signed)
 In the past, on Celexa , Abilify , Depakote .  She took herself off all of these several months ago and reports feeling well.  She wants to be off of the medicines.  GAD (10) and PHQ (12) screens today are in the moderate level.  She attributes this to recent deaths in the family.  No recent mania.  In the past, saw [facility] and [facility], but states that now we manage her medicines.  At this point, do not feel that she needs medicine, but will continue to screen for mania or other prolonged mood symptoms.
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
## 2024-05-25 ENCOUNTER — Other Ambulatory Visit: Payer: Self-pay | Admitting: *Deleted

## 2024-05-26 MED ORDER — LORATADINE 10 MG PO TABS: 10.0000 mg | ORAL_TABLET | Freq: Every day | ORAL | 0 refills | Status: DC | PRN

## 2024-05-26 NOTE — Progress Notes (Signed)
 Internal Medicine Clinic Attending  I was physically present during the key portions of the resident provided service and participated in the medical decision making of patient's management care. I reviewed pertinent patient test results.  The assessment, diagnosis, and plan were formulated together and I agree with the documentation in the resident's note.  Lovie Clarity, MD    Patient has anterior left groin pain on external hip rotation, which makes me concerned for hip osteoarthritis. This wasn't seen on prior imaging, so I agree with repeat plain films to start.

## 2024-05-28 ENCOUNTER — Other Ambulatory Visit: Payer: Self-pay | Admitting: Student

## 2024-06-01 ENCOUNTER — Telehealth: Payer: Self-pay

## 2024-06-01 NOTE — Telephone Encounter (Signed)
 Michele Gillespie (Key: [id number]) Wegovy  0.5MG /0.5ML auto-injectors Form [employer] Electronic Prior Authorization Form Created Sent to Plan Plan Response Submit Clinical Questions Determination Message from Plan Prior Authorization Not Required

## 2024-06-01 NOTE — Telephone Encounter (Signed)
 Prior Authorization for patient (Wegovy  0.5MG /0.5ML auto-injectors) came through on cover my meds was submitted with last office notes awaiting approval or denial.  XZB:[id number]

## 2024-06-02 ENCOUNTER — Encounter (HOSPITAL_COMMUNITY): Payer: Self-pay | Admitting: *Deleted

## 2024-06-02 ENCOUNTER — Telehealth: Payer: Self-pay

## 2024-06-02 ENCOUNTER — Other Ambulatory Visit: Payer: Self-pay

## 2024-06-02 ENCOUNTER — Inpatient Hospital Stay (HOSPITAL_COMMUNITY)
Admission: EM | Admit: 2024-06-02 | Discharge: 2024-06-04 | DRG: 389 | Disposition: A | Discharge status: Home / Self Care | Payer: MEDICAID | Attending: Infectious Diseases | Admitting: Infectious Diseases

## 2024-06-02 DIAGNOSIS — K581 Irritable bowel syndrome with constipation: Secondary | ICD-10-CM | POA: Diagnosis present

## 2024-06-02 DIAGNOSIS — I10 Essential (primary) hypertension: Secondary | ICD-10-CM | POA: Diagnosis present

## 2024-06-02 DIAGNOSIS — K311 Adult hypertrophic pyloric stenosis: Principal | ICD-10-CM | POA: Diagnosis present

## 2024-06-02 DIAGNOSIS — G4733 Obstructive sleep apnea (adult) (pediatric): Secondary | ICD-10-CM | POA: Diagnosis present

## 2024-06-02 DIAGNOSIS — Z6841 Body Mass Index (BMI) 40.0 and over, adult: Secondary | ICD-10-CM

## 2024-06-02 DIAGNOSIS — Z833 Family history of diabetes mellitus: Secondary | ICD-10-CM

## 2024-06-02 DIAGNOSIS — K219 Gastro-esophageal reflux disease without esophagitis: Secondary | ICD-10-CM | POA: Diagnosis present

## 2024-06-02 DIAGNOSIS — Z86711 Personal history of pulmonary embolism: Secondary | ICD-10-CM

## 2024-06-02 DIAGNOSIS — Z91040 Latex allergy status: Secondary | ICD-10-CM

## 2024-06-02 DIAGNOSIS — D72819 Decreased white blood cell count, unspecified: Secondary | ICD-10-CM | POA: Diagnosis present

## 2024-06-02 DIAGNOSIS — Z8249 Family history of ischemic heart disease and other diseases of the circulatory system: Secondary | ICD-10-CM

## 2024-06-02 DIAGNOSIS — N179 Acute kidney failure, unspecified: Secondary | ICD-10-CM | POA: Diagnosis present

## 2024-06-02 DIAGNOSIS — E669 Obesity, unspecified: Secondary | ICD-10-CM | POA: Diagnosis present

## 2024-06-02 DIAGNOSIS — Z7985 Long-term (current) use of injectable non-insulin antidiabetic drugs: Secondary | ICD-10-CM

## 2024-06-02 DIAGNOSIS — K3189 Other diseases of stomach and duodenum: Secondary | ICD-10-CM | POA: Diagnosis present

## 2024-06-02 DIAGNOSIS — F419 Anxiety disorder, unspecified: Secondary | ICD-10-CM | POA: Diagnosis present

## 2024-06-02 DIAGNOSIS — K3184 Gastroparesis: Secondary | ICD-10-CM | POA: Diagnosis present

## 2024-06-02 DIAGNOSIS — E785 Hyperlipidemia, unspecified: Secondary | ICD-10-CM | POA: Diagnosis present

## 2024-06-02 DIAGNOSIS — F17200 Nicotine dependence, unspecified, uncomplicated: Secondary | ICD-10-CM | POA: Diagnosis present

## 2024-06-02 DIAGNOSIS — K30 Functional dyspepsia: Secondary | ICD-10-CM | POA: Diagnosis present

## 2024-06-02 DIAGNOSIS — K56600 Partial intestinal obstruction, unspecified as to cause: Principal | ICD-10-CM | POA: Diagnosis present

## 2024-06-02 DIAGNOSIS — M351 Other overlap syndromes: Secondary | ICD-10-CM | POA: Diagnosis present

## 2024-06-02 DIAGNOSIS — J45909 Unspecified asthma, uncomplicated: Secondary | ICD-10-CM | POA: Diagnosis present

## 2024-06-02 DIAGNOSIS — D6859 Other primary thrombophilia: Secondary | ICD-10-CM | POA: Diagnosis present

## 2024-06-02 DIAGNOSIS — K589 Irritable bowel syndrome without diarrhea: Secondary | ICD-10-CM | POA: Diagnosis present

## 2024-06-02 DIAGNOSIS — R112 Nausea with vomiting, unspecified: Secondary | ICD-10-CM

## 2024-06-02 DIAGNOSIS — R1013 Epigastric pain: Secondary | ICD-10-CM

## 2024-06-02 DIAGNOSIS — Z79899 Other long term (current) drug therapy: Secondary | ICD-10-CM

## 2024-06-02 DIAGNOSIS — Z91048 Other nonmedicinal substance allergy status: Secondary | ICD-10-CM

## 2024-06-02 DIAGNOSIS — F17210 Nicotine dependence, cigarettes, uncomplicated: Secondary | ICD-10-CM | POA: Diagnosis present

## 2024-06-02 DIAGNOSIS — Z8711 Personal history of peptic ulcer disease: Secondary | ICD-10-CM

## 2024-06-02 DIAGNOSIS — Z86718 Personal history of other venous thrombosis and embolism: Secondary | ICD-10-CM

## 2024-06-02 DIAGNOSIS — Z7901 Long term (current) use of anticoagulants: Secondary | ICD-10-CM

## 2024-06-02 DIAGNOSIS — R1900 Intra-abdominal and pelvic swelling, mass and lump, unspecified site: Secondary | ICD-10-CM | POA: Diagnosis present

## 2024-06-02 LAB — I-STAT CHEM 8, ED
BUN: 13 mg/dL (ref 6–20)
Calcium, Ion: 1.13 mmol/L — ABNORMAL LOW (ref 1.15–1.40)
Chloride: 111 mmol/L (ref 98–111)
Creatinine, Ser: 1.2 mg/dL — ABNORMAL HIGH (ref 0.44–1.00)
Glucose, Bld: 85 mg/dL (ref 70–99)
HCT: 42 % (ref 36.0–46.0)
Hemoglobin: 14.3 g/dL (ref 12.0–15.0)
Potassium: 3.6 mmol/L (ref 3.5–5.1)
Sodium: 143 mmol/L (ref 135–145)
TCO2: 20 mmol/L — ABNORMAL LOW (ref 22–32)

## 2024-06-02 MED ORDER — ONDANSETRON 4 MG PO TBDP
8.0000 mg | ORAL_TABLET | Freq: Once | ORAL | Status: AC
  Administered 2024-06-02: 8 mg via ORAL
  Filled 2024-06-02: qty 2

## 2024-06-02 NOTE — ED Triage Notes (Signed)
 Pt states that she has had epigastric pain with vomiting x 1 day. Denies dysuria. Denies diarrhea/constipation.

## 2024-06-02 NOTE — Telephone Encounter (Signed)
 Prior Authorization for patient (Wegovy  2.4MG /0.75ML auto-injectors) came through on cover my meds was submitted with last office notes awaiting approval or denial.  KEY:[id number]

## 2024-06-02 NOTE — Telephone Encounter (Signed)
 Tobias Kapur (Key: [id number]) Wegovy  2.4MG /0.75ML auto-injectors Form [employer] Electronic Prior Authorization Form Created 6 minutes ago Sent to Plan 4 minutes ago Plan Response 4 minutes ago Submit Clinical Questions Determination Message from Plan Prior Authorization Not Required  I called and spoke to the pharmacy per pharmacists they do have the medication and will get the medication ready for pick up. Patient is aware.

## 2024-06-02 NOTE — Telephone Encounter (Unsigned)
 Copied from CRM #[id number]. Topic: Clinical - Prescription Issue >> Jun 01, 2024  3:41 PM Diannia H wrote: Reason for CRM: Patient was at the pharmacy and called about her Wegovy  0.5MG /0.5ML auto-injectors and wants to know if they have received her request from the pharmacy about her medicine. Contacted the clinic and spoke with [place] and she took over the call >> Jun 02, 2024  2:54 PM Brittney F wrote: Medication In Question:  Semaglutide -Weight Management 2.4 MG/0.75ML SOAJ    Preferred Pharmacy:  [id number] [address],  - [address] AT [facility] [address], [place] [address] Phone: [phone]  Fax: [phone] DEA #: [id number]    The patient is in need of the 2.4 MG injector not the starting 0.5 MG dose injector.  The patient called in and stated that she would like to speak to her prescribing provider (Dr. Norrine) due to continuing to have to contact the pharmacy and office about the prescription mentioned. Patient stated it is becoming a hugh inconvenience and she just wants the prescription setup where she doesn't have to get a prior authorization for it every time. The specialist did inform the patient that her prescription for Wegovy  doesn't require a Prior authorization at this time per insurance so the patient is unclear why the pharmacy stated it does. Please contact the patient with an update as soon as possible. CAL confirmed they will send the increased dose for approval via prior authorization and contact the patient back.  Callback Number: [id number]

## 2024-06-03 ENCOUNTER — Encounter (HOSPITAL_COMMUNITY)
Admission: EM | Disposition: A | Discharge status: Home / Self Care | Payer: Self-pay | Source: Home / Self Care | Attending: Internal Medicine

## 2024-06-03 ENCOUNTER — Inpatient Hospital Stay (HOSPITAL_COMMUNITY): Payer: MEDICAID | Admitting: Anesthesiology

## 2024-06-03 ENCOUNTER — Emergency Department (HOSPITAL_COMMUNITY): Payer: MEDICAID

## 2024-06-03 ENCOUNTER — Encounter (HOSPITAL_COMMUNITY): Payer: Self-pay | Admitting: Internal Medicine

## 2024-06-03 ENCOUNTER — Other Ambulatory Visit: Payer: Self-pay

## 2024-06-03 DIAGNOSIS — F17210 Nicotine dependence, cigarettes, uncomplicated: Secondary | ICD-10-CM | POA: Diagnosis present

## 2024-06-03 DIAGNOSIS — I10 Essential (primary) hypertension: Secondary | ICD-10-CM

## 2024-06-03 DIAGNOSIS — K56600 Partial intestinal obstruction, unspecified as to cause: Secondary | ICD-10-CM | POA: Diagnosis present

## 2024-06-03 DIAGNOSIS — R1900 Intra-abdominal and pelvic swelling, mass and lump, unspecified site: Secondary | ICD-10-CM | POA: Diagnosis present

## 2024-06-03 DIAGNOSIS — K219 Gastro-esophageal reflux disease without esophagitis: Secondary | ICD-10-CM | POA: Diagnosis present

## 2024-06-03 DIAGNOSIS — N179 Acute kidney failure, unspecified: Secondary | ICD-10-CM | POA: Diagnosis present

## 2024-06-03 DIAGNOSIS — D72819 Decreased white blood cell count, unspecified: Secondary | ICD-10-CM | POA: Diagnosis present

## 2024-06-03 DIAGNOSIS — E669 Obesity, unspecified: Secondary | ICD-10-CM | POA: Diagnosis present

## 2024-06-03 DIAGNOSIS — K3189 Other diseases of stomach and duodenum: Secondary | ICD-10-CM | POA: Diagnosis present

## 2024-06-03 DIAGNOSIS — R1013 Epigastric pain: Secondary | ICD-10-CM | POA: Diagnosis not present

## 2024-06-03 DIAGNOSIS — Z86718 Personal history of other venous thrombosis and embolism: Secondary | ICD-10-CM

## 2024-06-03 DIAGNOSIS — K311 Adult hypertrophic pyloric stenosis: Secondary | ICD-10-CM | POA: Diagnosis present

## 2024-06-03 DIAGNOSIS — E785 Hyperlipidemia, unspecified: Secondary | ICD-10-CM | POA: Diagnosis present

## 2024-06-03 DIAGNOSIS — T182XXA Foreign body in stomach, initial encounter: Secondary | ICD-10-CM

## 2024-06-03 DIAGNOSIS — Z86711 Personal history of pulmonary embolism: Secondary | ICD-10-CM

## 2024-06-03 DIAGNOSIS — Z7901 Long term (current) use of anticoagulants: Secondary | ICD-10-CM

## 2024-06-03 DIAGNOSIS — Z7985 Long-term (current) use of injectable non-insulin antidiabetic drugs: Secondary | ICD-10-CM

## 2024-06-03 DIAGNOSIS — K3184 Gastroparesis: Secondary | ICD-10-CM | POA: Diagnosis not present

## 2024-06-03 DIAGNOSIS — Z79899 Other long term (current) drug therapy: Secondary | ICD-10-CM

## 2024-06-03 DIAGNOSIS — K2950 Unspecified chronic gastritis without bleeding: Secondary | ICD-10-CM

## 2024-06-03 DIAGNOSIS — J45909 Unspecified asthma, uncomplicated: Secondary | ICD-10-CM | POA: Diagnosis present

## 2024-06-03 DIAGNOSIS — G4733 Obstructive sleep apnea (adult) (pediatric): Secondary | ICD-10-CM | POA: Diagnosis present

## 2024-06-03 DIAGNOSIS — R933 Abnormal findings on diagnostic imaging of other parts of digestive tract: Secondary | ICD-10-CM

## 2024-06-03 DIAGNOSIS — F418 Other specified anxiety disorders: Secondary | ICD-10-CM | POA: Diagnosis not present

## 2024-06-03 DIAGNOSIS — R112 Nausea with vomiting, unspecified: Secondary | ICD-10-CM | POA: Diagnosis not present

## 2024-06-03 DIAGNOSIS — Z8249 Family history of ischemic heart disease and other diseases of the circulatory system: Secondary | ICD-10-CM | POA: Diagnosis not present

## 2024-06-03 DIAGNOSIS — Z91040 Latex allergy status: Secondary | ICD-10-CM | POA: Diagnosis not present

## 2024-06-03 DIAGNOSIS — Z6841 Body Mass Index (BMI) 40.0 and over, adult: Secondary | ICD-10-CM | POA: Diagnosis not present

## 2024-06-03 DIAGNOSIS — Z91048 Other nonmedicinal substance allergy status: Secondary | ICD-10-CM | POA: Diagnosis not present

## 2024-06-03 DIAGNOSIS — K581 Irritable bowel syndrome with constipation: Secondary | ICD-10-CM | POA: Diagnosis present

## 2024-06-03 DIAGNOSIS — Z833 Family history of diabetes mellitus: Secondary | ICD-10-CM | POA: Diagnosis not present

## 2024-06-03 HISTORY — PX: ESOPHAGOGASTRODUODENOSCOPY: SHX5428

## 2024-06-03 LAB — CBC
HCT: 44.3 % (ref 36.0–46.0)
Hemoglobin: 13.9 g/dL (ref 12.0–15.0)
MCH: 25 pg — ABNORMAL LOW (ref 26.0–34.0)
MCHC: 31.4 g/dL (ref 30.0–36.0)
MCV: 79.7 fL — ABNORMAL LOW (ref 80.0–100.0)
Platelets: 214 K/uL (ref 150–400)
RBC: 5.56 MIL/uL — ABNORMAL HIGH (ref 3.87–5.11)
RDW: 14.6 % (ref 11.5–15.5)
WBC: 3.5 K/uL — ABNORMAL LOW (ref 4.0–10.5)
nRBC: 0 % (ref 0.0–0.2)

## 2024-06-03 LAB — LIPASE, BLOOD: Lipase: 28 U/L (ref 11–51)

## 2024-06-03 LAB — CBC WITH DIFFERENTIAL/PLATELET
Abs Immature Granulocytes: 0.01 K/uL (ref 0.00–0.07)
Basophils Absolute: 0 K/uL (ref 0.0–0.1)
Basophils Relative: 1 %
Eosinophils Absolute: 0 K/uL (ref 0.0–0.5)
Eosinophils Relative: 0 %
HCT: 44 % (ref 36.0–46.0)
Hemoglobin: 13.9 g/dL (ref 12.0–15.0)
Immature Granulocytes: 0 %
Lymphocytes Relative: 49 %
Lymphs Abs: 1.7 K/uL (ref 0.7–4.0)
MCH: 25 pg — ABNORMAL LOW (ref 26.0–34.0)
MCHC: 31.6 g/dL (ref 30.0–36.0)
MCV: 79.1 fL — ABNORMAL LOW (ref 80.0–100.0)
Monocytes Absolute: 0.3 K/uL (ref 0.1–1.0)
Monocytes Relative: 10 %
Neutro Abs: 1.4 K/uL — ABNORMAL LOW (ref 1.7–7.7)
Neutrophils Relative %: 40 %
Platelets: 214 K/uL (ref 150–400)
RBC: 5.56 MIL/uL — ABNORMAL HIGH (ref 3.87–5.11)
RDW: 14.7 % (ref 11.5–15.5)
WBC: 3.5 K/uL — ABNORMAL LOW (ref 4.0–10.5)
nRBC: 0 % (ref 0.0–0.2)

## 2024-06-03 LAB — URINALYSIS, ROUTINE W REFLEX MICROSCOPIC
Bacteria, UA: NONE SEEN
Bilirubin Urine: NEGATIVE
Glucose, UA: NEGATIVE mg/dL
Ketones, ur: NEGATIVE mg/dL
Leukocytes,Ua: NEGATIVE
Nitrite: NEGATIVE
Protein, ur: NEGATIVE mg/dL
Specific Gravity, Urine: 1.027 (ref 1.005–1.030)
pH: 5 (ref 5.0–8.0)

## 2024-06-03 LAB — COMPREHENSIVE METABOLIC PANEL WITH GFR
ALT: 18 U/L (ref 0–44)
AST: 15 U/L (ref 15–41)
Albumin: 3.6 g/dL (ref 3.5–5.0)
Alkaline Phosphatase: 37 U/L — ABNORMAL LOW (ref 38–126)
Anion gap: 11 (ref 5–15)
BUN: 14 mg/dL (ref 6–20)
CO2: 22 mmol/L (ref 22–32)
Calcium: 9.3 mg/dL (ref 8.9–10.3)
Chloride: 109 mmol/L (ref 98–111)
Creatinine, Ser: 1.24 mg/dL — ABNORMAL HIGH (ref 0.44–1.00)
GFR, Estimated: 53 mL/min — ABNORMAL LOW (ref >60)
Glucose, Bld: 88 mg/dL (ref 70–99)
Potassium: 3.6 mmol/L (ref 3.5–5.1)
Sodium: 142 mmol/L (ref 135–145)
Total Bilirubin: 0.6 mg/dL (ref 0.0–1.2)
Total Protein: 6.8 g/dL (ref 6.5–8.1)

## 2024-06-03 LAB — POC OCCULT BLOOD, ED: Fecal Occult Bld: NEGATIVE

## 2024-06-03 LAB — PROTIME-INR
INR: 1.9 — ABNORMAL HIGH (ref 0.8–1.2)
Prothrombin Time: 23.2 s — ABNORMAL HIGH (ref 11.4–15.2)

## 2024-06-03 LAB — TROPONIN I (HIGH SENSITIVITY)
Troponin I (High Sensitivity): <2 ng/L (ref <18)
Troponin I (High Sensitivity): <2 ng/L (ref <18)

## 2024-06-03 LAB — HEMOGLOBIN AND HEMATOCRIT, BLOOD
HCT: 45.2 % (ref 36.0–46.0)
Hemoglobin: 14.2 g/dL (ref 12.0–15.0)

## 2024-06-03 SURGERY — EGD (ESOPHAGOGASTRODUODENOSCOPY)
Anesthesia: General

## 2024-06-03 MED ORDER — SCOPOLAMINE 1 MG/3DAYS TD PT72
1.0000 | MEDICATED_PATCH | TRANSDERMAL | Status: DC
  Administered 2024-06-03: 1 mg via TRANSDERMAL
  Filled 2024-06-03: qty 1

## 2024-06-03 MED ORDER — PROPOFOL 10 MG/ML IV BOLUS
INTRAVENOUS | Status: DC | PRN
  Administered 2024-06-03: 160 mg via INTRAVENOUS
  Administered 2024-06-03: 40 mg via INTRAVENOUS

## 2024-06-03 MED ORDER — ONDANSETRON HCL 4 MG/2ML IJ SOLN: 4.0000 mg | Freq: Three times a day (TID) | INTRAMUSCULAR | Status: DC | PRN

## 2024-06-03 MED ORDER — LIDOCAINE 2% (20 MG/ML) 5 ML SYRINGE
INTRAMUSCULAR | Status: DC | PRN
  Administered 2024-06-03: 80 mg via INTRAVENOUS

## 2024-06-03 MED ORDER — PANTOPRAZOLE SODIUM 40 MG PO TBEC
40.0000 mg | DELAYED_RELEASE_TABLET | Freq: Two times a day (BID) | ORAL | Status: DC
  Administered 2024-06-03 – 2024-06-04 (×2): 40 mg via ORAL
  Filled 2024-06-03 (×2): qty 1

## 2024-06-03 MED ORDER — ONDANSETRON HCL 4 MG/2ML IJ SOLN
INTRAMUSCULAR | Status: DC | PRN
  Administered 2024-06-03: 4 mg via INTRAVENOUS

## 2024-06-03 MED ORDER — WARFARIN - PHARMACIST DOSING INPATIENT: Freq: Every day | Status: DC

## 2024-06-03 MED ORDER — DEXAMETHASONE SODIUM PHOSPHATE 10 MG/ML IJ SOLN
INTRAMUSCULAR | Status: DC | PRN
  Administered 2024-06-03: 5 mg via INTRAVENOUS

## 2024-06-03 MED ORDER — WARFARIN SODIUM 7.5 MG PO TABS: 15.0000 mg | ORAL_TABLET | ORAL | Status: DC

## 2024-06-03 MED ORDER — ACETAMINOPHEN 500 MG PO TABS
1000.0000 mg | ORAL_TABLET | Freq: Four times a day (QID) | ORAL | Status: DC
  Administered 2024-06-03 – 2024-06-04 (×5): 1000 mg via ORAL
  Filled 2024-06-03 (×6): qty 2

## 2024-06-03 MED ORDER — IOHEXOL 350 MG/ML SOLN
75.0000 mL | Freq: Once | INTRAVENOUS | Status: AC | PRN
Start: 2024-06-03 — End: 2024-06-03
  Administered 2024-06-03: 75 mL via INTRAVENOUS

## 2024-06-03 MED ORDER — ONDANSETRON HCL 4 MG/2ML IJ SOLN
4.0000 mg | Freq: Once | INTRAMUSCULAR | Status: AC
  Administered 2024-06-03: 4 mg via INTRAVENOUS
  Filled 2024-06-03: qty 2

## 2024-06-03 MED ORDER — LACTATED RINGERS IV BOLUS
1000.0000 mL | Freq: Once | INTRAVENOUS | Status: AC
  Administered 2024-06-03: 1000 mL via INTRAVENOUS

## 2024-06-03 MED ORDER — FENTANYL CITRATE PF 50 MCG/ML IJ SOSY
50.0000 ug | PREFILLED_SYRINGE | Freq: Once | INTRAMUSCULAR | Status: AC
  Administered 2024-06-03: 50 ug via INTRAVENOUS
  Filled 2024-06-03: qty 1

## 2024-06-03 MED ORDER — LACTATED RINGERS IV SOLN: INTRAVENOUS | Status: AC

## 2024-06-03 MED ORDER — LORATADINE 10 MG PO TABS: 10.0000 mg | ORAL_TABLET | Freq: Every day | ORAL | Status: DC | PRN

## 2024-06-03 MED ORDER — PANTOPRAZOLE SODIUM 40 MG IV SOLR
40.0000 mg | Freq: Once | INTRAVENOUS | Status: AC
  Administered 2024-06-03: 40 mg via INTRAVENOUS
  Filled 2024-06-03: qty 10

## 2024-06-03 MED ORDER — AMLODIPINE BESYLATE 5 MG PO TABS
5.0000 mg | ORAL_TABLET | Freq: Every day | ORAL | Status: DC
  Administered 2024-06-04: 5 mg via ORAL
  Filled 2024-06-03: qty 1

## 2024-06-03 MED ORDER — PANTOPRAZOLE SODIUM 40 MG IV SOLR
40.0000 mg | INTRAVENOUS | Status: DC
  Administered 2024-06-03: 40 mg via INTRAVENOUS
  Filled 2024-06-03: qty 10

## 2024-06-03 MED ORDER — WARFARIN SODIUM 10 MG PO TABS
10.0000 mg | ORAL_TABLET | Freq: Once | ORAL | Status: AC
  Administered 2024-06-03: 10 mg via ORAL
  Filled 2024-06-03: qty 1

## 2024-06-03 MED ORDER — ALBUTEROL SULFATE (2.5 MG/3ML) 0.083% IN NEBU: 2.5000 mg | INHALATION_SOLUTION | Freq: Four times a day (QID) | RESPIRATORY_TRACT | Status: DC | PRN

## 2024-06-03 MED ORDER — METOCLOPRAMIDE HCL 5 MG/ML IJ SOLN
10.0000 mg | Freq: Once | INTRAMUSCULAR | Status: AC
  Administered 2024-06-03: 10 mg via INTRAVENOUS
  Filled 2024-06-03: qty 2

## 2024-06-03 MED ORDER — WARFARIN SODIUM 10 MG PO TABS: 10.0000 mg | ORAL_TABLET | ORAL | Status: DC

## 2024-06-03 MED ORDER — WARFARIN SODIUM 10 MG PO TABS
10.0000 mg | ORAL_TABLET | ORAL | Status: DC
  Filled 2024-06-03: qty 1

## 2024-06-03 MED ORDER — PROCHLORPERAZINE EDISYLATE 10 MG/2ML IJ SOLN: 10.0000 mg | Freq: Four times a day (QID) | INTRAMUSCULAR | Status: DC | PRN

## 2024-06-03 MED ORDER — PANTOPRAZOLE SODIUM 40 MG IV SOLR: 40.0000 mg | Freq: Two times a day (BID) | INTRAVENOUS | Status: DC

## 2024-06-03 MED ORDER — DEXMEDETOMIDINE HCL IN NACL 80 MCG/20ML IV SOLN
INTRAVENOUS | Status: DC | PRN
  Administered 2024-06-03: 8 ug via INTRAVENOUS

## 2024-06-03 MED ORDER — PHENOL 1.4 % MT LIQD
1.0000 | OROMUCOSAL | Status: DC | PRN
  Administered 2024-06-04: 1 via OROMUCOSAL
  Filled 2024-06-03: qty 354

## 2024-06-03 MED ORDER — LIDOCAINE VISCOUS HCL 2 % MT SOLN
15.0000 mL | Freq: Once | OROMUCOSAL | Status: AC
  Administered 2024-06-03: 15 mL via OROMUCOSAL
  Filled 2024-06-03: qty 15

## 2024-06-03 MED ORDER — FAMOTIDINE IN NACL 20-0.9 MG/50ML-% IV SOLN
20.0000 mg | Freq: Once | INTRAVENOUS | Status: AC
  Administered 2024-06-03: 20 mg via INTRAVENOUS
  Filled 2024-06-03: qty 50

## 2024-06-03 MED ORDER — NICOTINE 14 MG/24HR TD PT24
14.0000 mg | MEDICATED_PATCH | Freq: Every day | TRANSDERMAL | Status: DC
  Administered 2024-06-04: 14 mg via TRANSDERMAL
  Filled 2024-06-03: qty 1

## 2024-06-03 MED ORDER — DICLOFENAC SODIUM 1 % EX GEL
4.0000 g | Freq: Four times a day (QID) | CUTANEOUS | Status: DC
  Administered 2024-06-04: 4 g via TOPICAL
  Filled 2024-06-03 (×2): qty 100

## 2024-06-03 MED ORDER — SUCCINYLCHOLINE CHLORIDE 200 MG/10ML IV SOSY
PREFILLED_SYRINGE | INTRAVENOUS | Status: DC | PRN
  Administered 2024-06-03: 160 mg via INTRAVENOUS

## 2024-06-03 MED ORDER — ALUM & MAG HYDROXIDE-SIMETH 200-200-20 MG/5ML PO SUSP
30.0000 mL | Freq: Once | ORAL | Status: AC
  Administered 2024-06-03: 30 mL via ORAL
  Filled 2024-06-03: qty 30

## 2024-06-03 NOTE — H&P (Addendum)
 Date: 06/03/2024               Patient Name:  Michele Gillespie MRN: [id number]  DOB: 1975/11/10 Age / Sex: 48 y.o., female   PCP: Harrie Bruckner, DO         Medical Service: Internal Medicine Teaching Service         Attending Physician: Dr. Rosan Dayton BROCKS, DO      First Contact: Dr. Letha Cheadle, MD    Second Contact: Dr. 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 She has chronic intermittent nausea but notes this is worse and the nausea has been steadily worsening for the past few weeks. She has not been able to keep down any solids or liquids since the abdominal pain started. She occasionally has the feeling of food getting stuck in her lower esophagus with some mild pain but she is not having this pain now or yesterday. She had some red vomit but had been drinking cheerwine and denied any clots or pain with vomiting. She recently took her first dose of increased Wegovy  2.4 mg on Monday but has not had any worsening of chronic nausea on this medication and no significant change in symptoms Monday or Tuesday. She also has trouble with constipation and sometimes gets low back pain from this which is much different than her current pain. Last bowel movement was yesterday morning, she did not look at it but denies liquid BM and last dark BM she saw was over 2 weeks ago. She has also had some intermittent lightheadedness without falls or syncope. She previously had falls associated with LE weakness which was worse on  chloroquine (stopped 2-3 years ago). Notes mild dyspnea on exertion but no orthopnea, chest pain, presyncope w/ exertion, or worsening LE edema. She has noticed darker urine but denies significant change in urine output, dysuria, or frequency/urgency.   ED Course: Presented as above hemodynamically stable with creatinine 1.2 up from 0.8 baseline, WBC 3.5, hgb stable at 14, UA with small amount of dip hgb but 0 RBC on micro, negative FBOT, and INR of 1.9. CTAP w/ showed 3 cm mass like finding in the duodenal bulb likely causing gastric outlet obstruction.  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 Independent in ADLs/IADLs. Works as a Curator, Technical sales engineer, and studying criminal justice at [employer].  PCP: Harrie Bruckner, DO Substances: 40-80 pyh (started smoking around  age 42 with up to 2 ppd), currently smoking 1/3 ppd. Prior heavy alcohol use but seldom use in the past 8 years. Prior recreation drug use, no IVDU.  Family History:  Family History  Problem Relation Age of Onset   Hypertension Mother    Congestive Heart Failure Mother    Diabetes Father    Breast cancer Sister    Breast cancer Sister    Birth defects Maternal Aunt    Birth defects Maternal Uncle    Diabetes Paternal Grandmother    Lupus Niece    Colon cancer Neg Hx    Esophageal cancer Neg Hx    Stomach cancer Neg Hx    Rectal cancer Neg  Hx    Sleep apnea Neg Hx     Allergies: Allergies as of 06/02/2024   (No Known Allergies)    Review of Systems: A complete ROS was negative except as per HPI.   OBJECTIVE:   Physical Exam: Blood pressure 110/76, pulse 75, temperature 97.8 F (36.6 C), temperature source Oral, resp. rate 19, height 5' 4 (1.626 m), weight 104.2 kg, SpO2 97%.  Constitutional:uncomfortable appearing female laying in bed. In no acute distress. HENT: Normocephalic, atraumatic,  Eyes: Sclera non-icteric, PERRL, EOM intact Cardio:Regular rate and rhythm. 2+ bilateral radial pulses. Pulm:Clear to auscultation bilaterally. Normal work of breathing on room air. Abdomen: Soft, tender in the LLQ, non-distended, hypoactive bowel sounds. MSK: trace bilateral lower extremity edema. Skin:Warm and dry. Neuro:Alert and oriented x3. No focal deficit noted. Psych:Pleasant mood and affect.  Labs: CBC    Component Value Date/Time   WBC 3.5 (L) 06/02/2024 2337   RBC 5.56 (H) 06/02/2024 2337   HGB 14.3 06/02/2024 2342   HGB 13.6 04/20/2018 1232   HGB 11.5 (L) 01/23/2011 1449   HCT 42.0 06/02/2024 2342   HCT 41.6 04/20/2018 1232   HCT 35.0 01/23/2011 1449   PLT 214 06/02/2024 2337   PLT 188 04/20/2018 1232   MCV 79.7 (L) 06/02/2024 2337   MCV 79 04/20/2018 1232   MCV 77 (L) 01/23/2011 1449   MCH 25.0 (L) 06/02/2024 2337   MCHC 31.4 06/02/2024 2337   RDW 14.6 06/02/2024 2337   RDW 14.7 04/20/2018 1232   RDW 14.1 01/23/2011 1449   LYMPHSABS 1.7 10/05/2023 1240   LYMPHSABS 1.5 01/23/2011 1449   MONOABS 0.4 10/05/2023 1240   EOSABS 0.1 10/05/2023 1240   EOSABS 0.1 01/23/2011 1449   BASOSABS 0.0 10/05/2023 1240   BASOSABS 0.0 01/23/2011 1449     CMP     Component Value Date/Time   NA 143 06/02/2024 2342   NA 140 09/26/2021 0930   K 3.6 06/02/2024 2342   CL 111 06/02/2024 2342   CO2 22 06/02/2024 2337   GLUCOSE 85 06/02/2024 2342   BUN 13 06/02/2024 2342   BUN 16 09/26/2021 0930   CREATININE  1.20 (H) 06/02/2024 2342   CREATININE 0.86 06/16/2018 1102   CREATININE 0.85 04/29/2014 1029   CALCIUM 9.3 06/02/2024 2337   PROT 6.8 06/02/2024 2337   ALBUMIN 3.6 06/02/2024 2337   AST 15 06/02/2024 2337   AST 23 06/16/2018 1102   ALT 18 06/02/2024 2337   ALT 35 06/16/2018 1102   ALKPHOS 37 (L) 06/02/2024 2337   BILITOT 0.6 06/02/2024 2337   BILITOT 0.4 06/16/2018 1102   GFRNONAA 53 (L) 06/02/2024 2337   GFRNONAA >60 06/16/2018 1102   GFRNONAA 86 04/29/2014 1029   GFRAA 56 (L) 05/21/2020 0034   GFRAA >60 06/16/2018  1102   GFRAA >89 04/29/2014 1029    Imaging: CT ABDOMEN PELVIS W CONTRAST Result Date: 06/03/2024 CLINICAL DATA:  Epigastric pain, nausea, and vomiting for 1 day. EXAM: CT ABDOMEN AND PELVIS WITH CONTRAST TECHNIQUE: Multidetector CT imaging of the abdomen and pelvis was performed using the standard protocol following bolus administration of intravenous contrast. RADIATION DOSE REDUCTION: This exam was performed according to the departmental dose-optimization program which includes automated exposure control, adjustment of the mA and/or kV according to patient size and/or use of iterative reconstruction technique. CONTRAST:  75mL OMNIPAQUE  IOHEXOL  350 MG/ML SOLN COMPARISON:  CTs with IV contrast 10/05/2023 and 08/06/2021 FINDINGS: Lower chest: Linear scar-like opacities in the left lower lobe. Lung bases are clear of infiltrates. The cardiac size is normal. Hepatobiliary: The liver is mildly steatotic measuring 21 cm length. There is no mass enhancement. The gallbladder and bile ducts are unremarkable. Pancreas: Mild fatty infiltration. No mass, ductal dilatation or inflammatory change. Spleen: No abnormality. Adrenals/Urinary Tract: No abnormality. Stomach/Bowel: The stomach is fluid filled, with a normal wall thickness. There is a rounded soft tissue filling defect in the bulb of the duodenum measuring 1.9 x 3.0 cm on 3:27, 1.1 cm in height on 6:69, and is also visible on 7:71.  Findings are concerning for a mass in the duodenal bulb. Either upper GI series with graded compression of the area or endoscopy is recommended for further evaluation. Possible this could be causing a low-grade gastric outlet obstruction. Rest of the small bowel is normal caliber. There are no inflammatory changes. The appendix is normal. There is no wall thickening or dilatation of the colon. There is diverticulosis of the descending and sigmoid segments, without diverticulitis. Vascular/Lymphatic: There is a periportal lymph node measuring 1.4 cm in short axis on 3:22, which appears to have been present previously. There is no further adenopathy. There is mild aortoiliac calcific plaque, without aneurysm, stenosis or dissection. Reproductive: Uterus and bilateral adnexa are unremarkable. Other: Small umbilical fat hernia. No incarcerated hernia. No free fluid, free hemorrhage or free air. Musculoskeletal: No acute or significant osseous findings. IMPRESSION: 1. 1.9 x 3.0 x 1.1 cm rounded soft tissue filling defect in the bulb of the duodenum, concerning for a mass. Either upper GI series with graded compression of the area or endoscopy is recommended for further evaluation. Possible this could be causing a low-grade gastric outlet obstruction. 2. Mildly prominent periportal lymph node measuring 1.4 cm in short axis, appears to have been present previously. 3. Mildly prominent liver with mild steatosis. 4. Aortic atherosclerosis. 5. Diverticulosis without evidence of diverticulitis. 6. Umbilical fat hernia. Aortic Atherosclerosis (ICD10-I70.0). Electronically Signed   By: Francis Quam M.D.   On: 06/03/2024 07:02     EKG: personally reviewed my interpretation is NSR w/ occasional PAC. Consistent with prior EKG.  ASSESSMENT & PLAN:   Assessment & Plan by Problem: Principal Problem:   Partial bowel obstruction (HCC) Active Problems:   Leukopenia   Hypercoagulable state (HCC)   Irritable bowel syndrome  (IBS)   Long term current use of anticoagulant   Tobacco use disorder   GERD (gastroesophageal reflux disease)   Hypertension   Mixed connective tissue disease (HCC)   AKI (acute kidney injury) (HCC)   Duodenal mass   Michele Gillespie is a 48 y.o. 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  Small bowel obstruction/gastric outlet obstruction Duodenal bulb mass Symptoms suggestive of acute on chronic partial now maybe full SBO due to a mass like structure in the duodenal bulb. She does not have signs of perforation or indication for emergent surgery consult. GI consulted in the ED and plan for EGD today.  She did have some red vomiting but has stable hemoglobin and this does seem to be due to drinking red soda, low concern for Mallory-Weiss tear without pain, also FOBT negative and no coffee-ground emesis to indicate bleeding gastric ulcer.  GLP-1 agonist could be contributing to symptoms especially with recent increase in dose, however the mass seems to be the primary problem. She will likely need to at least decrease the dose or pause this medication on discharge but she is reluctant to entertain this. She does have diverticulosis without imaging consistent with diverticulitis. There is LLQ pain but overall not consistent with diverticulitis.  Discussed starting a small bowel follow-through but GI would like to proceed with EGD first.  We will manage her pain with preferably non-opiate medications and follow closely with GI and re-evaluate the need for surgical consult after EGD - NPO - EGD today - LR at 100 mL an hour for 20 hours - IV pantoprazole  40 mg BID - tylenol  1000 mg every 6 hours - scopolamine  patch, zofran  4 mg iv q8h prn, and compazine  10 mg iv q6h prn for refractory nausea - holding off on  surgery consult but if there is any worsening in symptoms we will consult   Recurrent DVT/PE 2/2 protein S/C deficiency INR goal 2-3 on warfarin. Since she is NPO we will anticoagulate with heparin after EGD today.  - heparin per pharmacy to start after EGD   AKI Creatinine 1.24 on admission up from 0.8 baseline. Likely pre-renal due to nausea and vomiting. No reported decrease in urine output. Getting 3 L IVF today.  - repeat CMP in am - monitor UOP  IBS-C GERD Has intermittent issues with constipation and takes linaclotide  as needed with last bowel movement yesterday morning. Will add bowel regimen when taking PO or try suppository if bowel function returns to normal and still having no bowel movements.  Obesity Said she took first dose of 2.4 mg semaglutide  on Monday. Pharmacy fill history and notes suggest that this would be a very fast titration due to first fill of 0.25/0.5 mg pen on 05/05/2024 and last office visit on 05/24/2024 indicating that she would start 0.5 mg after that visit. We will clarify the dose with her but as above this may be contributing to gastric outlet obstruction especially if she increased to 2.4 mg that quickly.   Leukopenia  WBC 3.5 on admission. Stable over the past few years. Has been neutropenic in the past. Afebrile here.  - add on diff   Hypertension Home medication amlodipine  5 mg daily. Normotensive here.  - resume amlodipine  5 mg daily when able to take PO  Tobacco use disorder Currently down to 1/3 ppd from 2 ppd and she is interested in quitting. Patches and gum have worked in the past.  - 14 mg nicotine  patches  ?Lupus vs mixed connective tissue disorder Seen by Dr. Jeannetta (Rheumatology) last in 2022 after stopping chloroquine due to side effects of LE weakness. Autoimmune labs below. No signs of active flare at the moment but she will likely benefit from follow up with Dr. Jeannetta after discharge.   11/2020 Anti-dsDNA, C3/4, ESR  neg/WNL  08/2018 RNP pos 559 U1RNP 112 MyoMarker panel otherwise neg SM, SSA, SSB, dsDNA neg ACA, B2GP1, LA neg C3 C4 wnl ANA 1:2560 speckled  Diet: NPO VTE: Heparin Anticoag dose Code: Full  Dispo: Admit patient to Inpatient with expected length of stay greater than 2 midnights.  Signed: Fairy Pool, DO Internal Medicine Resident, PGY-3 Please contact the on call pager at [phone] for any urgent or emergent needs. 11:01 AM 06/03/2024

## 2024-06-03 NOTE — Op Note (Signed)
 [facility] Patient Name: Michele Gillespie Procedure Date : 06/03/2024 MRN: [id number] Attending MD: Inocente Hausen , MD, [id number] Date of Birth: Jun 26, 1976 CSN: [id number] Age: 48 Admit Type: Inpatient Procedure:                Upper GI endoscopy Indications:              Epigastric abdominal pain, Abnormal CT of the GI                            tract, Nausea with vomiting, Dark stools Providers:                Inocente Hausen, MD, Almarie Masters, RN, Fairy Marina, Technician Referring MD:              Medicines:                General Anesthesia Complications:            No immediate complications. Estimated blood loss:                            Minimal. Estimated Blood Loss:     Estimated blood loss was minimal. Procedure:                Pre-Anesthesia Assessment:                           - Prior to the procedure, a History and Physical                            was performed, and patient medications and                            allergies were reviewed. The patient's tolerance of                            previous anesthesia was also reviewed. The risks                            and benefits of the procedure and the sedation                            options and risks were discussed with the patient.                            All questions were answered, and informed consent                            was obtained. Prior Anticoagulants: The patient has                            taken Coumadin  (warfarin), last dose was 1 day  prior to procedure. INR 1.9. ASA Grade Assessment:                            III - A patient with severe systemic disease. After                            reviewing the risks and benefits, the patient was                            deemed in satisfactory condition to undergo the                            procedure.                           After obtaining informed consent, the  endoscope was                            passed under direct vision. Throughout the                            procedure, the patient's blood pressure, pulse, and                            oxygen saturations were monitored continuously. The                            [id number]-[id number] ([id number]) Olympus endoscope was introduced                            through the mouth, and advanced to the second part                            of duodenum. The upper GI endoscopy was                            accomplished without difficulty. The patient                            tolerated the procedure well. Scope In: Scope Out: Findings:      The examined esophagus was normal.      A moderate amount of retained fluid was found in the gastric fundus and       in the gastric body. Fluid aspiration was performed.      Normal mucosa was found in the gastric body, in the gastric antrum, in       the cardia (on retroflexion) and in the gastric fundus (on       retroflexion). Biopsies were taken with a cold forceps for Helicobacter       pylori testing.      The duodenal bulb and second portion of the duodenum were normal. There       were no mass lesions, ulcers or other anatomic abnormalities in the       duodenal bulb or second portion of the duodenum to correlate with  findings on CT scan. In the setting of suspected delayed gastric       emptying query if retained food or medication tablet in the intestine       could have contributed to CT findings. Impression:               - Normal esophagus.                           - Retained gastric fluid. Fluid aspiration                            performed.                           - Normal mucosa was found in the gastric body, in                            the antrum, in the cardia and in the gastric                            fundus. Biopsied.                           - Normal duodenal bulb and second portion of the                            duodenum.  There were no mass lesions, ulcers or                            other anatomic abnormalities in the duodenal bulb                            or second portion of the duodenum to correlate with                            findings on CT scan.                           - In the absence of anatomic abnormalities causing                            mechanical obstruction, a motility disorder such as                            gastroparesis could contribute to the patient's                            symptomatology given the retained fluid in the                            stomach at the time of endoscopy. The use of Wegovy                             may exacerbate this. In the setting of suspected  delayed gastric emptying query if retained food or                            medication tablet in the intestine could have                            contributed to CT findings. Recommendation:           - Return patient to hospital ward for ongoing care.                           - Await pathology results. Rule out H. pylori                            contributing to symptomatology.                           - Continue pantoprazole  40 mg p.o. twice daily.                           - Consider gastroparesis diet.                           - Consider trial of Reglan  5 mg p.o. or IV every 6                            hours while in the hospital                           - Results discussed with patient. Procedure Code(s):        --- Professional ---                           [phone], Esophagogastroduodenoscopy, flexible,                            transoral; with biopsy, single or multiple Diagnosis Code(s):        --- Professional ---                           R10.13, Epigastric pain                           R11.2, Nausea with vomiting, unspecified                           R93.3, Abnormal findings on diagnostic imaging of                            other parts of digestive  tract CPT copyright 2022 [employer]. All rights reserved. The codes documented in this report are preliminary and upon coder review may  be revised to meet current compliance requirements. Inocente Hausen, MD 06/03/2024 1:41:23 PM This report has been signed electronically. Number of Addenda: 0

## 2024-06-03 NOTE — ED Notes (Signed)
 Added to CCMD

## 2024-06-03 NOTE — ED Notes (Signed)
 Refused to get vitals taken.

## 2024-06-03 NOTE — ED Notes (Signed)
 Admit MD at bedside
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
## 2024-06-03 NOTE — Transfer of Care (Signed)
 Immediate Anesthesia Transfer of Care Note  Patient: Michele Gillespie  Procedure(s) Performed: EGD (ESOPHAGOGASTRODUODENOSCOPY)  Patient Location: PACU  Anesthesia Type:General  Level of Consciousness: drowsy  Airway & Oxygen Therapy: Patient Spontanous Breathing  Post-op Assessment: Report given to RN and Post -op Vital signs reviewed and stable  Post vital signs: Reviewed and stable  Last Vitals:  Vitals Value Taken Time  BP 145/105 06/03/24 13:40  Temp    Pulse 88 06/03/24 13:41  Resp 19 06/03/24 13:41  SpO2 93 % 06/03/24 13:41  Vitals shown include unfiled device data.  Last Pain:  Vitals:   06/03/24 1224  TempSrc: Temporal  PainSc: 8       Patients Stated Pain Goal: 0 (06/02/24 2317)  Complications: No notable events documented.
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
## 2024-06-03 NOTE — Anesthesia Preprocedure Evaluation (Addendum)
 Anesthesia Evaluation  Patient identified by MRN, date of birth, ID band Patient awake    Reviewed: Allergy & Precautions, NPO status , Patient's Chart, lab work & pertinent test results  History of Anesthesia Complications Negative for: history of anesthetic complications  Airway Mallampati: II  TM Distance: >3 FB Neck ROM: Full    Dental no notable dental hx. (+) Teeth Intact, Dental Advisory Given   Pulmonary asthma , sleep apnea , Current SmokerPatient did not abstain from smoking., PE   Pulmonary exam normal breath sounds clear to auscultation       Cardiovascular hypertension, (-) angina + DVT  (-) Past MI Normal cardiovascular exam Rhythm:Regular Rate:Normal     Neuro/Psych  PSYCHIATRIC DISORDERS Anxiety Depression Bipolar Disorder      GI/Hepatic ,GERD  Medicated and Controlled,,  Endo/Other    Renal/GU Renal InsufficiencyRenal diseaseLab Results      Component                Value               Date                      NA                       143                 06/02/2024                CL                       111                 06/02/2024                K                        3.6                 06/02/2024                CO2                      22                  06/02/2024                BUN                      13                  06/02/2024                CREATININE               1.20 (H)            06/02/2024                GFRNONAA                 53 (L)              06/02/2024                CALCIUM                  9.3  06/02/2024                ALBUMIN                  3.6                 06/02/2024                GLUCOSE                  85                  06/02/2024                Musculoskeletal   Abdominal   Peds  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  Lab Results      Component                Value               Date                      WBC                       3.5 (L)             06/02/2024                HGB                      14.3                06/02/2024                HCT                      42.0                06/02/2024                MCV                      79.7 (L)            06/02/2024                PLT                      214                 06/02/2024       Lab Results      Component                Value               Date                      INR                      1.9 (H)             06/03/2024                INR                      3.0  05/24/2024                INR                      2.3 (H)             04/20/2024                    Anesthesia Other Findings   Reproductive/Obstetrics                              Anesthesia Physical Anesthesia Plan  ASA: 3 and emergent  Anesthesia Plan: General   Post-op Pain Management: Minimal or no pain anticipated   Induction: Intravenous, Rapid sequence and Cricoid pressure planned  PONV Risk Score and Plan: Treatment may vary due to age or medical condition and Propofol  infusion  Airway Management Planned: Oral ETT  Additional Equipment: None  Intra-op Plan:   Post-operative Plan:   Informed Consent: I have reviewed the patients History and Physical, chart, labs and discussed the procedure including the risks, benefits and alternatives for the proposed anesthesia with the patient or authorized representative who has indicated his/her understanding and acceptance.     Dental advisory given  Plan Discussed with: CRNA and Surgeon  Anesthesia Plan Comments: (EGD for Nausea and Epigastric pain  Plan GA Pt last wegovy  less than 24 Hourd)         Anesthesia Quick Evaluation

## 2024-06-03 NOTE — Anesthesia Procedure Notes (Signed)
 Procedure Name: Intubation Date/Time: 06/03/2024 1:08 PM  Performed by: Claudene Arlin LABOR, CRNAPre-anesthesia Checklist: Patient identified, Emergency Drugs available, Suction available and Patient being monitored Patient Re-evaluated:Patient Re-evaluated prior to induction Oxygen Delivery Method: Circle system utilized Preoxygenation: Pre-oxygenation with 100% oxygen Induction Type: IV induction, Rapid sequence and Cricoid Pressure applied Laryngoscope Size: Mac and 3 Grade View: Grade I Tube type: Oral Tube size: 7.5 mm Number of attempts: 1 Airway Equipment and Method: Stylet Placement Confirmation: ETT inserted through vocal cords under direct vision, positive ETCO2 and breath sounds checked- equal and bilateral Secured at: 22 cm Tube secured with: Tape Dental Injury: Teeth and Oropharynx as per pre-operative assessment

## 2024-06-03 NOTE — Plan of Care (Signed)
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
## 2024-06-03 NOTE — ED Provider Notes (Signed)
 [place] EMERGENCY DEPARTMENT AT [facility] Provider Note   CSN: [id number] Arrival date & time: 06/02/24  2314     Patient presents with: Abdominal Pain   Michele Gillespie is a 48 y.o. female.   Patient with a history of PE/DVT on Coumadin , hypertension, hyperlipidemia, GERD presents with epigastric pain, nausea and vomiting.  States she was on her way to work last evening and began to feel nauseated.  Her nausea became worse and she developed epigastric pain with multiple episodes of nausea and vomiting.  States she vomited several times but cannot quantify how many.  States the emesis was red in color but she was drinking cheerwine just prior to this.  Denies any black or bloody stools.  Having severe epigastric pain without radiation.  Pain is constant.  Does have a history of reflux but this feels different.  Still has appendix and gallbladder.  She has never had an EGD but believes she was told she had ulcers in the past.  Does take a PPI.  Denies any chest pain or shortness of breath.  States she has been constipated not had a bowel movement for several days.  The history is provided by the patient.  Abdominal Pain Associated symptoms: constipation, nausea and vomiting   Associated symptoms: no cough, no dysuria, no fever, no hematuria and no shortness of breath        Prior to Admission medications   Medication Sig Start Date End Date Taking? Authorizing Provider  amLODipine  (NORVASC ) 5 MG tablet Take 1 tablet (5 mg total) by mouth daily. 09/29/23   Addie Perkins, DO  CHLOROQUINE PHOSPHATE PO Take 1 tablet by mouth daily.    [provider]  diclofenac  Sodium (VOLTAREN ) 1 % GEL Apply 4 g topically 4 (four) times daily. Patient not taking: Reported on 05/24/2024 05/24/24   Harrie Bruckner, DO  fluticasone  (FLONASE ) 50 MCG/ACT nasal spray Place 1 spray into both nostrils daily. Patient not taking: Reported on 05/24/2024 12/21/21   Hazen Darryle BRAVO, FNP   linaclotide  (LINZESS ) 72 MCG capsule Take 1 capsule (72 mcg total) by mouth daily before breakfast. Patient not taking: Reported on 05/24/2024 04/20/24   Nooruddin, Saad, MD  loratadine  (CLARITIN ) 10 MG tablet Take 1 tablet (10 mg total) by mouth daily as needed for allergies. 05/26/24   Harrie Bruckner, DO  pantoprazole  (PROTONIX ) 20 MG tablet TAKE 1 TABLET BY MOUTH EVERY DAY 03/29/24   Harrie Bruckner, DO  Semaglutide -Weight Management 0.5 MG/0.5ML SOAJ Inject 0.5 mg into the skin once a week for 28 days. Patient not taking: Reported on 05/24/2024 06/01/24 06/29/24  Norrine Sharper, MD  Semaglutide -Weight Management 1 MG/0.5ML SOAJ Inject 1 mg into the skin once a week for 28 days. Patient not taking: Reported on 05/24/2024 06/30/24 07/28/24  Norrine Sharper, MD  Semaglutide -Weight Management 1.7 MG/0.75ML SOAJ Inject 1.7 mg into the skin once a week for 28 days. Patient not taking: Reported on 05/24/2024 07/29/24 08/26/24  Norrine Sharper, MD  Semaglutide -Weight Management 2.4 MG/0.75ML SOAJ Inject 2.4 mg into the skin once a week. Patient not taking: Reported on 05/24/2024 08/27/24   Norrine Sharper, MD  VENTOLIN  HFA 108 (90 Base) MCG/ACT inhaler INHALE 1-2 PUFFS BY MOUTH EVERY 6 HOURS AS NEEDED FOR WHEEZE OR SHORTNESS OF BREATH 01/23/24   Harrie Bruckner, DO  warfarin (COUMADIN ) 10 MG tablet Take one (1) tablet only on Mondays and Thursdays. All other days, take one and one half (1&1/2) tablets. 05/24/24   Ruthell Agent  B, RPH-CPP    Allergies: Patient has no known allergies.    Review of Systems  Constitutional:  Negative for activity change, appetite change and fever.  HENT:  Negative for congestion.   Respiratory:  Negative for cough, chest tightness and shortness of breath.   Gastrointestinal:  Positive for abdominal pain, constipation, nausea and vomiting.  Genitourinary:  Negative for dysuria and hematuria.  Musculoskeletal:  Negative for arthralgias and myalgias.  Skin:   Negative for rash.  Neurological:  Negative for dizziness, weakness and headaches.   all other systems are negative except as noted in the HPI and PMH.    Updated Vital Signs BP 110/76 (BP Location: Left Arm)   Pulse 75   Temp 97.8 F (36.6 C) (Oral)   Resp 19   Ht 5' 4 (1.626 m)   Wt 104.2 kg   SpO2 97%   BMI 39.43 kg/m   Physical Exam Vitals and nursing note reviewed.  Constitutional:      General: She is not in acute distress.    Appearance: She is well-developed.  HENT:     Head: Normocephalic and atraumatic.     Mouth/Throat:     Pharynx: No oropharyngeal exudate.  Eyes:     Conjunctiva/sclera: Conjunctivae normal.     Pupils: Pupils are equal, round, and reactive to light.  Neck:     Comments: No meningismus. Cardiovascular:     Rate and Rhythm: Normal rate and regular rhythm.     Heart sounds: Normal heart sounds. No murmur heard. Pulmonary:     Effort: Pulmonary effort is normal. No respiratory distress.     Breath sounds: Normal breath sounds.  Abdominal:     Palpations: Abdomen is soft.     Tenderness: There is abdominal tenderness. There is guarding. There is no rebound.     Comments: Epigastric tenderness with voluntary guarding  Genitourinary:    Comments: Chaperone present IT sales professional. No external hemorrhoids, no fissures. No gross blood.  Musculoskeletal:        General: No tenderness. Normal range of motion.     Cervical back: Normal range of motion and neck supple.  Skin:    General: Skin is warm.  Neurological:     Mental Status: She is alert and oriented to person, place, and time.     Cranial Nerves: No cranial nerve deficit.     Motor: No abnormal muscle tone.     Coordination: Coordination normal.     Comments:  5/5 strength throughout. CN 2-12 intact.Equal grip strength.   Psychiatric:        Behavior: Behavior normal.     (all labs ordered are listed, but only abnormal results are displayed) Labs Reviewed  COMPREHENSIVE METABOLIC  PANEL WITH GFR - Abnormal; Notable for the following components:      Result Value   Creatinine, Ser 1.24 (*)    Alkaline Phosphatase 37 (*)    GFR, Estimated 53 (*)    All other components within normal limits  CBC - Abnormal; Notable for the following components:   WBC 3.5 (*)    RBC 5.56 (*)    MCV 79.7 (*)    MCH 25.0 (*)    All other components within normal limits  URINALYSIS, ROUTINE W REFLEX MICROSCOPIC - Abnormal; Notable for the following components:   APPearance HAZY (*)    Hgb urine dipstick SMALL (*)    All other components within normal limits  I-STAT CHEM 8, ED - Abnormal; Notable  for the following components:   Creatinine, Ser 1.20 (*)    Calcium, Ion 1.13 (*)    TCO2 20 (*)    All other components within normal limits  LIPASE, BLOOD  PROTIME-INR  HEMOGLOBIN AND HEMATOCRIT, BLOOD  POC OCCULT BLOOD, ED  TROPONIN I (HIGH SENSITIVITY)  TROPONIN I (HIGH SENSITIVITY)    EKG: None  Radiology: CT ABDOMEN PELVIS W CONTRAST Result Date: 06/03/2024 CLINICAL DATA:  Epigastric pain, nausea, and vomiting for 1 day. EXAM: CT ABDOMEN AND PELVIS WITH CONTRAST TECHNIQUE: Multidetector CT imaging of the abdomen and pelvis was performed using the standard protocol following bolus administration of intravenous contrast. RADIATION DOSE REDUCTION: This exam was performed according to the departmental dose-optimization program which includes automated exposure control, adjustment of the mA and/or kV according to patient size and/or use of iterative reconstruction technique. CONTRAST:  75mL OMNIPAQUE  IOHEXOL  350 MG/ML SOLN COMPARISON:  CTs with IV contrast 10/05/2023 and 08/06/2021 FINDINGS: Lower chest: Linear scar-like opacities in the left lower lobe. Lung bases are clear of infiltrates. The cardiac size is normal. Hepatobiliary: The liver is mildly steatotic measuring 21 cm length. There is no mass enhancement. The gallbladder and bile ducts are unremarkable. Pancreas: Mild fatty  infiltration. No mass, ductal dilatation or inflammatory change. Spleen: No abnormality. Adrenals/Urinary Tract: No abnormality. Stomach/Bowel: The stomach is fluid filled, with a normal wall thickness. There is a rounded soft tissue filling defect in the bulb of the duodenum measuring 1.9 x 3.0 cm on 3:27, 1.1 cm in height on 6:69, and is also visible on 7:71. Findings are concerning for a mass in the duodenal bulb. Either upper GI series with graded compression of the area or endoscopy is recommended for further evaluation. Possible this could be causing a low-grade gastric outlet obstruction. Rest of the small bowel is normal caliber. There are no inflammatory changes. The appendix is normal. There is no wall thickening or dilatation of the colon. There is diverticulosis of the descending and sigmoid segments, without diverticulitis. Vascular/Lymphatic: There is a periportal lymph node measuring 1.4 cm in short axis on 3:22, which appears to have been present previously. There is no further adenopathy. There is mild aortoiliac calcific plaque, without aneurysm, stenosis or dissection. Reproductive: Uterus and bilateral adnexa are unremarkable. Other: Small umbilical fat hernia. No incarcerated hernia. No free fluid, free hemorrhage or free air. Musculoskeletal: No acute or significant osseous findings. IMPRESSION: 1. 1.9 x 3.0 x 1.1 cm rounded soft tissue filling defect in the bulb of the duodenum, concerning for a mass. Either upper GI series with graded compression of the area or endoscopy is recommended for further evaluation. Possible this could be causing a low-grade gastric outlet obstruction. 2. Mildly prominent periportal lymph node measuring 1.4 cm in short axis, appears to have been present previously. 3. Mildly prominent liver with mild steatosis. 4. Aortic atherosclerosis. 5. Diverticulosis without evidence of diverticulitis. 6. Umbilical fat hernia. Aortic Atherosclerosis (ICD10-I70.0).  Electronically Signed   By: Francis Quam M.D.   On: 06/03/2024 07:02     Procedures   Medications Ordered in the ED  alum & mag hydroxide-simeth (MAALOX/MYLANTA) 200-200-20 MG/5ML suspension 30 mL (has no administration in time range)  lidocaine  (XYLOCAINE ) 2 % viscous mouth solution 15 mL (has no administration in time range)  famotidine  (PEPCID ) IVPB 20 mg premix (has no administration in time range)  ondansetron  (ZOFRAN ) injection 4 mg (has no administration in time range)  lactated ringers  bolus 1,000 mL (has no administration in time range)  ondansetron  (ZOFRAN -ODT) disintegrating tablet 8 mg (8 mg Oral Given 06/02/24 2347)                                    Medical Decision Making Amount and/or Complexity of Data Reviewed Labs: ordered. Decision-making details documented in ED Course. Radiology: ordered and independent interpretation performed. Decision-making details documented in ED Course. ECG/medicine tests: ordered and independent interpretation performed. Decision-making details documented in ED Course.  Risk OTC drugs. Prescription drug management. Decision regarding hospitalization.   Epigastric pain with nausea and vomiting.  Stable vitals.  No distress.  Abdomen soft but tender in epigastrium.  Emesis dark in color but she was drinking sure wine.  Denies any black or bloody stools.  Labs obtained in triage showed no leukocytosis.  LFTs and lipase are normal.  Will add on INR.  EKG with no acute ST changes. Chart review shows EGD in 2022 showed erosive gastropathy.  Also showed nonbleeding gastric ulcer.  Chart reviewed.  Patient denies any further nausea or vomiting but is encouraged to save emesis to be testing for blood if it recurs.  Her vitals remained stable.  Hemoccult negative as above.  Will repeat hemoglobin after fluid resuscitation.  INR pending.  CT scan to evaluate for intraabdominal pathology but low suspicion for cholecystitis or appendicitis at  this time.  Will repeat hemoglobin to ensure no downtrend.  Suspect her red emesis likely secondary to food and not blood.  However she does have a history of ulcers as above.  Hemoccult is negative and vital signs remained stable.  CT concerning for duodenal mass causing possible gastric outlet obstruction.  Patient remained stable.  Repeat hemoglobin is pending.  She received PPI, Pepcid , GI cocktail.  Still having abdominal pain.  Will dose fentanyl .  Message sent to gastroenterology.  Likely need hospitalist admission.  Callback pending at shift change.       Final diagnoses:  Gastric outlet obstruction    ED Discharge Orders     None          Collier Bohnet, Garnette, MD 06/03/24 [phone]

## 2024-06-04 ENCOUNTER — Other Ambulatory Visit (HOSPITAL_COMMUNITY): Payer: Self-pay

## 2024-06-04 DIAGNOSIS — K3184 Gastroparesis: Secondary | ICD-10-CM | POA: Diagnosis not present

## 2024-06-04 DIAGNOSIS — T182XXA Foreign body in stomach, initial encounter: Secondary | ICD-10-CM | POA: Diagnosis not present

## 2024-06-04 DIAGNOSIS — K3189 Other diseases of stomach and duodenum: Secondary | ICD-10-CM | POA: Diagnosis not present

## 2024-06-04 LAB — CBC WITH DIFFERENTIAL/PLATELET
Abs Immature Granulocytes: 0.02 K/uL (ref 0.00–0.07)
Basophils Absolute: 0 K/uL (ref 0.0–0.1)
Basophils Relative: 0 %
Eosinophils Absolute: 0 K/uL (ref 0.0–0.5)
Eosinophils Relative: 0 %
HCT: 43.8 % (ref 36.0–46.0)
Hemoglobin: 14.1 g/dL (ref 12.0–15.0)
Immature Granulocytes: 1 %
Lymphocytes Relative: 34 %
Lymphs Abs: 1.3 K/uL (ref 0.7–4.0)
MCH: 25.1 pg — ABNORMAL LOW (ref 26.0–34.0)
MCHC: 32.2 g/dL (ref 30.0–36.0)
MCV: 77.9 fL — ABNORMAL LOW (ref 80.0–100.0)
Monocytes Absolute: 0.3 K/uL (ref 0.1–1.0)
Monocytes Relative: 8 %
Neutro Abs: 2.1 K/uL (ref 1.7–7.7)
Neutrophils Relative %: 57 %
Platelets: 200 K/uL (ref 150–400)
RBC: 5.62 MIL/uL — ABNORMAL HIGH (ref 3.87–5.11)
RDW: 14.3 % (ref 11.5–15.5)
WBC: 3.7 K/uL — ABNORMAL LOW (ref 4.0–10.5)
nRBC: 0 % (ref 0.0–0.2)

## 2024-06-04 LAB — COMPREHENSIVE METABOLIC PANEL WITH GFR
ALT: 16 U/L (ref 0–44)
AST: 16 U/L (ref 15–41)
Albumin: 3.3 g/dL — ABNORMAL LOW (ref 3.5–5.0)
Alkaline Phosphatase: 34 U/L — ABNORMAL LOW (ref 38–126)
Anion gap: 11 (ref 5–15)
BUN: 8 mg/dL (ref 6–20)
CO2: 22 mmol/L (ref 22–32)
Calcium: 9.1 mg/dL (ref 8.9–10.3)
Chloride: 107 mmol/L (ref 98–111)
Creatinine, Ser: 1.04 mg/dL — ABNORMAL HIGH (ref 0.44–1.00)
GFR, Estimated: >60 mL/min (ref >60)
Glucose, Bld: 93 mg/dL (ref 70–99)
Potassium: 3.9 mmol/L (ref 3.5–5.1)
Sodium: 140 mmol/L (ref 135–145)
Total Bilirubin: 0.8 mg/dL (ref 0.0–1.2)
Total Protein: 6.6 g/dL (ref 6.5–8.1)

## 2024-06-04 LAB — SURGICAL PATHOLOGY

## 2024-06-04 LAB — PROTIME-INR
INR: 2.1 — ABNORMAL HIGH (ref 0.8–1.2)
Prothrombin Time: 24.8 s — ABNORMAL HIGH (ref 11.4–15.2)

## 2024-06-04 MED ORDER — ACETAMINOPHEN 500 MG PO TABS
1000.0000 mg | ORAL_TABLET | Freq: Four times a day (QID) | ORAL | 0 refills | Status: AC
  Filled 2024-06-04: qty 30, 4d supply, fill #0

## 2024-06-04 MED ORDER — SCOPOLAMINE 1 MG/3DAYS TD PT72
1.0000 | MEDICATED_PATCH | TRANSDERMAL | 12 refills | Status: AC
  Filled 2024-06-04: qty 10, 30d supply, fill #0

## 2024-06-04 MED ORDER — PANTOPRAZOLE SODIUM 40 MG PO TBEC
40.0000 mg | DELAYED_RELEASE_TABLET | Freq: Two times a day (BID) | ORAL | 0 refills | Status: AC
  Filled 2024-06-04: qty 60, 30d supply, fill #0

## 2024-06-04 MED ORDER — METOCLOPRAMIDE HCL 5 MG PO TABS
5.0000 mg | ORAL_TABLET | Freq: Four times a day (QID) | ORAL | 0 refills | Status: AC
  Filled 2024-06-04: qty 120, 30d supply, fill #0

## 2024-06-04 MED ORDER — METOCLOPRAMIDE HCL 5 MG/ML IJ SOLN
10.0000 mg | Freq: Once | INTRAMUSCULAR | Status: AC
  Administered 2024-06-04: 10 mg via INTRAVENOUS
  Filled 2024-06-04: qty 2

## 2024-06-04 MED ORDER — DICLOFENAC SODIUM 1 % EX GEL
4.0000 g | Freq: Four times a day (QID) | CUTANEOUS | 0 refills | Status: AC
Start: 2024-06-04 — End: ?
  Filled 2024-06-04: qty 100, 7d supply, fill #0

## 2024-06-04 MED ORDER — METOCLOPRAMIDE HCL 5 MG PO TABS
5.0000 mg | ORAL_TABLET | Freq: Four times a day (QID) | ORAL | Status: DC
  Administered 2024-06-04: 5 mg via ORAL
  Filled 2024-06-04: qty 1

## 2024-06-04 NOTE — Progress Notes (Signed)
 Inpatient Progress Note     Patient Profile/Chief Complaint  48 year old female with a history of GERD, peptic ulcer disease without H. pylori, gastric intestinal metaplasia, DVT/PE on Coumadin  admitted with epigastric abdominal pain, nausea and dark stools. Patient reports a history of chronic nausea with recent onset of vomiting. CT scan of the abdomen and pelvis shows a fluid-filled stomach with a filling defect in the duodenal bulb concerning for mass and partial obstruction.   EGD 06/03/2024-moderate amount of retained gastric fluid suggestive of delayed gastric emptying, otherwise normal esophagus, stomach and small intestine.  Biopsies show reactive gastropathy, negative for H. pylori.   Interval History   -- Reports ongoing abdominal pain today -- Has nausea but emesis is improved -- States she has not had much to eat or drink today     Objective   Vital signs in last 24 hours: Temp:  [98.1 F (36.7 C)-98.6 F (37 C)] 98.2 F (36.8 C) (09/05 0800) Pulse Rate:  [63-77] 63 (09/05 0800) Resp:  [17-20] 18 (09/05 0800) BP: (122-131)/(77-88) 122/82 (09/05 0800) SpO2:  [94 %-99 %] 94 % (09/05 0434) Weight:  [112.1 kg] 112.1 kg (09/05 0434) Last BM Date : 06/03/24 General:    Alert, resting in bed no distress Heart:  Regular rate and rhythm; no murmurs Lungs: Respirations even and unlabored, lungs CTA bilaterally Abdomen:  Soft, tender to palpation in upper abdomen and nondistended. Normal bowel sounds. Extremities:  Without edema. Neurologic:  Alert and oriented,  grossly normal neurologically. Psych:  Cooperative. Normal mood and affect.  Intake/Output from previous day: 09/04 0701 - 09/05 0700 In: 1320 [P.O.:820; I.V.:500] Out: -  Intake/Output this shift: No intake/output data recorded.  Lab Results: Recent Labs    06/02/24 2337 06/02/24 2342 06/03/24 1445 06/04/24 0255  WBC 3.5*  3.5*  --   --  3.7*  HGB 13.9  13.9 14.3 14.2 14.1  HCT 44.0  44.3 42.0  45.2 43.8  PLT 214  214  --   --  200   BMET Recent Labs    06/02/24 2337 06/02/24 2342 06/04/24 0255  NA 142 143 140  K 3.6 3.6 3.9  CL 109 111 107  CO2 22  --  22  GLUCOSE 88 85 93  BUN 14 13 8   CREATININE 1.24* 1.20* 1.04*  CALCIUM 9.3  --  9.1   LFT Recent Labs    06/04/24 0255  PROT 6.6  ALBUMIN 3.3*  AST 16  ALT 16  ALKPHOS 34*  BILITOT 0.8   PT/INR Recent Labs    06/03/24 0647 06/04/24 0255  LABPROT 23.2* 24.8*  INR 1.9* 2.1*    Studies/Results: CT ABDOMEN PELVIS W CONTRAST Result Date: 06/03/2024 CLINICAL DATA:  Epigastric pain, nausea, and vomiting for 1 day. EXAM: CT ABDOMEN AND PELVIS WITH CONTRAST TECHNIQUE: Multidetector CT imaging of the abdomen and pelvis was performed using the standard protocol following bolus administration of intravenous contrast. RADIATION DOSE REDUCTION: This exam was performed according to the departmental dose-optimization program which includes automated exposure control, adjustment of the mA and/or kV according to patient size and/or use of iterative reconstruction technique. CONTRAST:  75mL OMNIPAQUE  IOHEXOL  350 MG/ML SOLN COMPARISON:  CTs with IV contrast 10/05/2023 and 08/06/2021 FINDINGS: Lower chest: Linear scar-like opacities in the left lower lobe. Lung bases are clear of infiltrates. The cardiac size is normal. Hepatobiliary: The liver is mildly steatotic measuring 21 cm length. There is no mass enhancement. The gallbladder and bile ducts are unremarkable.  Pancreas: Mild fatty infiltration. No mass, ductal dilatation or inflammatory change. Spleen: No abnormality. Adrenals/Urinary Tract: No abnormality. Stomach/Bowel: The stomach is fluid filled, with a normal wall thickness. There is a rounded soft tissue filling defect in the bulb of the duodenum measuring 1.9 x 3.0 cm on 3:27, 1.1 cm in height on 6:69, and is also visible on 7:71. Findings are concerning for a mass in the duodenal bulb. Either upper GI series with graded  compression of the area or endoscopy is recommended for further evaluation. Possible this could be causing a low-grade gastric outlet obstruction. Rest of the small bowel is normal caliber. There are no inflammatory changes. The appendix is normal. There is no wall thickening or dilatation of the colon. There is diverticulosis of the descending and sigmoid segments, without diverticulitis. Vascular/Lymphatic: There is a periportal lymph node measuring 1.4 cm in short axis on 3:22, which appears to have been present previously. There is no further adenopathy. There is mild aortoiliac calcific plaque, without aneurysm, stenosis or dissection. Reproductive: Uterus and bilateral adnexa are unremarkable. Other: Small umbilical fat hernia. No incarcerated hernia. No free fluid, free hemorrhage or free air. Musculoskeletal: No acute or significant osseous findings. IMPRESSION: 1. 1.9 x 3.0 x 1.1 cm rounded soft tissue filling defect in the bulb of the duodenum, concerning for a mass. Either upper GI series with graded compression of the area or endoscopy is recommended for further evaluation. Possible this could be causing a low-grade gastric outlet obstruction. 2. Mildly prominent periportal lymph node measuring 1.4 cm in short axis, appears to have been present previously. 3. Mildly prominent liver with mild steatosis. 4. Aortic atherosclerosis. 5. Diverticulosis without evidence of diverticulitis. 6. Umbilical fat hernia. Aortic Atherosclerosis (ICD10-I70.0). Electronically Signed   By: Francis Quam M.D.   On: 06/03/2024 07:02    Endoscopic Studies: EGD 06/03/2024-moderate amount of retained gastric fluid suggestive of delayed gastric emptying, otherwise normal esophagus, stomach and small intestine.  Biopsies show reactive gastropathy, negative for H. pylori.   Clinical Impression   48 year old female with a history of GERD, peptic ulcer disease without H. pylori, gastric intestinal metaplasia, DVT/PE on  Coumadin  admitted with epigastric abdominal pain, nausea and dark stools. Patient reports a history of chronic nausea with recent onset of vomiting. CT scan of the abdomen and pelvis shows a fluid-filled stomach with a filling defect in the duodenal bulb concerning for mass and partial obstruction.   EGD 06/03/2024-moderate amount of retained gastric fluid suggestive of delayed gastric emptying, otherwise normal esophagus, stomach and small intestine.  Biopsies show reactive gastropathy, negative for H. pylori.  Clinical picture is most consistent with a form of delayed gastric emptying/gastroparesis.  Symptoms have predated her use of Wegovy .  Discussed that the use of GLP-1 medications in the setting of delayed gastric emptying may exacerbate symptoms.  Reviewed importance of a gastroparesis diet.  Also discussed promotility medication such as Reglan  which she may find beneficial.   Plan  Gastroparesis diet Trial of Reglan  5 mg p.o. 4 times daily Patient being discharged with scopolamine  patch Continue Linzess  for constipation  Patient will follow-up with PCP and does not necessarily need GI clinic follow-up at this time.    LOS: 1 day   Inocente CHRISTELLA Hausen  06/04/2024, 3:50 PM  Inocente Hausen, MD [facility]

## 2024-06-04 NOTE — Discharge Summary (Signed)
 Name: Michele Gillespie MRN: [id number] DOB: 09-10-1976 48 y.o. PCP: Harrie Bruckner, DO  Date of Admission: 06/02/2024 11:18 PM Date of Discharge: 06/04/2024  Attending Physician: Dr. Reyes Fenton  Discharge Diagnosis: Principal Problem:   Gastric out let obstruction Active Problems:   Leukopenia   Hypercoagulable state (HCC)   Irritable bowel syndrome (IBS)   Long term current use of anticoagulant   Tobacco use disorder   GERD (gastroesophageal reflux disease)   Hypertension   Mixed connective tissue disease (HCC)   AKI (acute kidney injury) (HCC)   Partial bowel obstruction (HCC)   Duodenal mass   Nausea and vomiting   Epigastric abdominal pain Obesity Gastroparesis   Discharge Medications: Allergies as of 06/04/2024       Reactions   Latex Rash   Tape Rash        Medication List     STOP taking these medications    semaglutide -weight management 0.5 MG/0.5ML Soaj SQ injection Commonly known as: WEGOVY        TAKE these medications    Acetaminophen  Extra Strength 500 MG Tabs Take 2 tablets (1,000 mg total) by mouth every 6 (six) hours.   amLODipine  5 MG tablet Commonly known as: NORVASC  Take 1 tablet (5 mg total) by mouth daily.   diclofenac  Sodium 1 % Gel Commonly known as: Voltaren  Apply 4 g topically 4 (four) times daily.   fluticasone  50 MCG/ACT nasal spray Commonly known as: FLONASE  Place 1 spray into both nostrils daily.   linaclotide  72 MCG capsule Commonly known as: Linzess  Take 1 capsule (72 mcg total) by mouth daily before breakfast.   loratadine  10 MG tablet Commonly known as: CLARITIN  Take 1 tablet (10 mg total) by mouth daily as needed for allergies.   metoCLOPramide  5 MG tablet Commonly known as: REGLAN  Take 1 tablet (5 mg total) by mouth every 6 (six) hours.   pantoprazole  40 MG tablet Commonly known as: PROTONIX  Take 1 tablet (40 mg total) by mouth 2 (two) times daily. What changed:  medication strength how much to  take when to take this   scopolamine  1 MG/3DAYS Commonly known as: TRANSDERM-SCOP Place 1 patch (1 mg total) onto the skin every 3 (three) days. Start taking on: June 06, 2024   Ventolin  HFA 108 (90 Base) MCG/ACT inhaler Generic drug: albuterol  INHALE 1-2 PUFFS BY MOUTH EVERY 6 HOURS AS NEEDED FOR WHEEZE OR SHORTNESS OF BREATH What changed: See the new instructions.   warfarin 10 MG tablet Commonly known as: COUMADIN  Take as directed. If you are unsure how to take this medication, talk to your nurse or doctor. Original instructions: Take one (1) tablet only on Mondays and Thursdays. All other days, take one and one half (1&1/2) tablets. What changed:  how much to take how to take this when to take this additional instructions        Disposition and follow-up:   [name] was discharged from [facility] in Stable condition.  At the hospital follow up visit please address:  1.  Follow-up:   a. Gastroparesis/GOO - EGD findings rule out mass in the duodenum.  Symptoms were likely due to medication-induced gastroparesis from Wegovy .  Please consider discontinuing all GLP-1 agonists or restarting at lowest available dose.  Please ensure she follows up with [facility].    b. TUD - reports feeling ready to stop smoking.  If she does not already have nicotine  patches or gum, consider prescribing this.   c. Protein  S/C Deficiency with recurrent DVT/PE - ensure the patient is taking her warfarin and is monitoring her INR.   2.  Labs / imaging needed at time of follow-up: none  3.  Pending labs/ test needing follow-up: none   Follow-up Appointments:  Follow-up Information     Harrie Bruckner, DO Follow up on 06/08/2024.   Specialty: Internal Medicine Why: You have an appointment scheduled with Dr. Harrie on 06/08/24 at 2:45pm. Contact information: [address] [place] [address] [phone]                 Hospital  Course by problem list: #Gastroparesis #Gastric Outlet Obstruction Patient presented with symptoms of abdominal pain, nausea, vomiting which are acute on chronic.  She did endorse throwing up some red fluid but did drink red soda before doing so.  She recently started Wegovy  at too high of a dose given notes from last [facility] visit with Dr. Norrine.  CT imaging showed signs concerning for a mass in the duodenum, however GI was consulted and performed endoscopy showing no mass lesions, ulcers, or anatomic correlates with imaging findings.  There was retained food in liquid noted which is likely the mass seen on imaging.  She was started on pantoprazole  40 mg twice daily with recommendations to trial Reglan .  Surgical biopsies were taken from EGD which showed reactive gastropathy, negative for H. pylori.  Her symptoms were deemed to be due to medication-induced gastroparesis.  We have since discontinued her Wegovy  and instructed her to follow-up at the [facility] for further guidance.   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  She was switched to heparin while NPO before her endoscopy.  She was switched back to warfarin with INR at goal upon discharge.  #AKI Creatinine 1.24 on admission up from 0.8 baseline.  On day of discharge her creatinine improved to 1.04.  Her AKI was likely pre-renal due to nausea and vomiting. No reported decrease in urine output.  She received IV fluids during her stay.    #IBS-C Previous history of intermittent issues with constipation and takes Linzess  as needed.  Last bowel movement occurred before hospitalization.   #Obesity Said she took first dose of 2.4 mg semaglutide  on Monday. Pharmacy fill history and notes suggest that this would be a very fast titration due to first fill of 0.25/0.5 mg pen on 05/05/2024 and last office visit on 05/24/2024 indicating that she would start 0.5 mg after that visit.   When asked about this, the patient stated that she increased from 0.25 mg straight to 2.4 mg without any doses in between.  She has been educated on the appropriate dosing intervals for this medication.  We have since paused her Wegovy .   #Leukopenia  WBC 3.5 on admission and was 3.7 on day of discharge. Stable over the past few years. Has been neutropenic in the past. Afebrile here.    #Hypertension Home medication amlodipine  5 mg daily. Normotensive here.  We held this medication while she was n.p.o. pending her procedure.  She is okay to restart upon discharge.   #Tobacco use disorder Currently down to 1/3 ppd from 2 ppd and she is interested in quitting. Patches and gum have worked in the past.  We used nicotine  patches while she was in the hospital.  She should address this at the next [facility] visit.   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  Rice (Rheumatology) last in 2022 after stopping chloroquine due to side effects of LE weakness. No signs of active flare during this admission but she will likely benefit from follow up with Dr. Jeannetta after discharge.    Discharge Subjective: [name] reports still feeling abdominal pain, but Reglan  really helped her. Was able to tolerate lunch. Has not had a bowel movement since the episode. Patient is medically ready for discharge.  Discharge Exam:   BP 122/82 (BP Location: Right Arm)   Pulse 63   Temp 98.2 F (36.8 C) (Oral)   Resp 18   Ht 5' 4 (1.626 m)   Wt 112.1 kg   SpO2 94%   BMI 42.42 kg/m  Physical Exam: Constitutional: well-appearing, well-nourished, in no acute distress HENT: normocephalic atraumatic, mucous membranes moist Eyes: conjunctiva non-erythematous, PERRL, no scleral icterus Neck: supple without lesions, thyroid non-enlarged and non-tender Cardiovascular: regular rate and rhythm, no m/r/g Pulmonary/Chest: normal work of breathing on room air, lungs clear to auscultation bilaterally Abdominal: soft,  non-distended, bowel sounds normal; mildly TTP in mid-epigastric region and LLQ MSK: normal bulk and tone Neurological: alert & oriented x3 Skin: warm and dry Extremities: no edema or cyanosis; peripheral pulses intact Psych: normal mood and affect, thought content normal   Pertinent Labs, Studies, and Procedures:     Latest Ref Rng & Units 06/04/2024    2:55 AM 06/03/2024    2:45 PM 06/02/2024   11:42 PM  CBC  WBC 4.0 - 10.5 K/uL 3.7     Hemoglobin 12.0 - 15.0 g/dL 85.8  85.7  85.6   Hematocrit 36.0 - 46.0 % 43.8  45.2  42.0   Platelets 150 - 400 K/uL 200          Latest Ref Rng & Units 06/04/2024    2:55 AM 06/02/2024   11:42 PM 06/02/2024   11:37 PM  CMP  Glucose 70 - 99 mg/dL 93  85  88   BUN 6 - 20 mg/dL 8  13  14    Creatinine 0.44 - 1.00 mg/dL 8.95  8.79  8.75   Sodium 135 - 145 mmol/L 140  143  142   Potassium 3.5 - 5.1 mmol/L 3.9  3.6  3.6   Chloride 98 - 111 mmol/L 107  111  109   CO2 22 - 32 mmol/L 22   22   Calcium 8.9 - 10.3 mg/dL 9.1   9.3   Total Protein 6.5 - 8.1 g/dL 6.6   6.8   Total Bilirubin 0.0 - 1.2 mg/dL 0.8   0.6   Alkaline Phos 38 - 126 U/L 34   37   AST 15 - 41 U/L 16   15   ALT 0 - 44 U/L 16   18     CT ABDOMEN PELVIS W CONTRAST Result Date: 06/03/2024 CLINICAL DATA:  Epigastric pain, nausea, and vomiting for 1 day. EXAM: CT ABDOMEN AND PELVIS WITH CONTRAST TECHNIQUE: Multidetector CT imaging of the abdomen and pelvis was performed using the standard protocol following bolus administration of intravenous contrast. RADIATION DOSE REDUCTION: This exam was performed according to the departmental dose-optimization program which includes automated exposure control, adjustment of the mA and/or kV according to patient size and/or use of iterative reconstruction technique. CONTRAST:  75mL OMNIPAQUE  IOHEXOL  350 MG/ML SOLN COMPARISON:  CTs with IV contrast 10/05/2023 and 08/06/2021 FINDINGS: Lower chest: Linear scar-like opacities in the left lower lobe. Lung bases are  clear of infiltrates. The cardiac size is  normal. Hepatobiliary: The liver is mildly steatotic measuring 21 cm length. There is no mass enhancement. The gallbladder and bile ducts are unremarkable. Pancreas: Mild fatty infiltration. No mass, ductal dilatation or inflammatory change. Spleen: No abnormality. Adrenals/Urinary Tract: No abnormality. Stomach/Bowel: The stomach is fluid filled, with a normal wall thickness. There is a rounded soft tissue filling defect in the bulb of the duodenum measuring 1.9 x 3.0 cm on 3:27, 1.1 cm in height on 6:69, and is also visible on 7:71. Findings are concerning for a mass in the duodenal bulb. Either upper GI series with graded compression of the area or endoscopy is recommended for further evaluation. Possible this could be causing a low-grade gastric outlet obstruction. Rest of the small bowel is normal caliber. There are no inflammatory changes. The appendix is normal. There is no wall thickening or dilatation of the colon. There is diverticulosis of the descending and sigmoid segments, without diverticulitis. Vascular/Lymphatic: There is a periportal lymph node measuring 1.4 cm in short axis on 3:22, which appears to have been present previously. There is no further adenopathy. There is mild aortoiliac calcific plaque, without aneurysm, stenosis or dissection. Reproductive: Uterus and bilateral adnexa are unremarkable. Other: Small umbilical fat hernia. No incarcerated hernia. No free fluid, free hemorrhage or free air. Musculoskeletal: No acute or significant osseous findings. IMPRESSION: 1. 1.9 x 3.0 x 1.1 cm rounded soft tissue filling defect in the bulb of the duodenum, concerning for a mass. Either upper GI series with graded compression of the area or endoscopy is recommended for further evaluation. Possible this could be causing a low-grade gastric outlet obstruction. 2. Mildly prominent periportal lymph node measuring 1.4 cm in short axis, appears to have been  present previously. 3. Mildly prominent liver with mild steatosis. 4. Aortic atherosclerosis. 5. Diverticulosis without evidence of diverticulitis. 6. Umbilical fat hernia. Aortic Atherosclerosis (ICD10-I70.0). Electronically Signed   By: Francis Quam M.D.   On: 06/03/2024 07:02     Discharge Instructions:   Discharge Instructions      To Michele Gillespie or their caretakers,  You were recently admitted to [facility] for gastroparesis which is the slowing of your stomach.  This was likely due to the increased dose of Wegovy  that you took in the hospital.  We have prescribed you medication to help increase the motility of your gastrointestinal tract called Reglan  (metoclopramide ).  Additionally, please do not take anymore Wegovy  until you see Dr. Harrie at your next hospital follow-up appointment.  We have increased your dose of pantoprazole  (Protonix ) from 20 mg to 40 mg twice daily.  Continue taking your home medications with the following changes:  Start taking Acetaminophen  (Tylenol ) 500 mg tablet - take 2 tablets by mouth every 6 hours Metoclopramide  (Reglan ) 5 mg every 6 hours Scopolamine  (Transderm-scope) 1 mg every 3 days Pantoprazole  (Protonix ) 40 mg tablet twice daily Stop taking Semaglutide -weight management (Wegovy ) Pantoprazole  (Protonix ) 20 mg daily - see above for increased dose Continue taking Amlodipine  (Norvasc ) 5 mg daily Diclofenac  sodium (Voltaren ) 1% gel - 4 g topically 4 times daily Fluticasone  (Flonase ) 50 mcg/ACT nasal spray Linaclotide  (Linzess ) 72 mcg daily Loratadine  (Claritin ) 10 mg daily as needed for allergies Albuterol  (Ventolin  HFA) 108 (90 base) micrograms/ACT inhaler spacer- inhale 1 to 2 puffs by mouth every 6 hours as needed for wheezing or shortness of breath Warfarin (Coumadin ) 10 mg tablet - take 1 tablet only on Mondays and Thursdays.  All other days take 1-1/2 tablets.   You should  seek further medical care if you experience worsening  abdominal pain, distention, or do not have any bowel movements for an extended period of time.  Please also seek medical care if you experience chest pain, shortness of breath, unilateral leg swelling, facial droop.  Please follow up with the following doctors/specialties: Please call [facility] [phone] for a follow-up appointment with the gastroenterologist.   You also have a scheduled appointment with Dr. Lonni Africa on 06/08/2024. We are so glad that you are feeling better.  Sincerely,  [facility]     Signed:  Letha Cheadle, MD Internal Medicine Resident, PGY-1 06/04/2024, 3:46 PM Please contact the on call pager after 5 pm and on weekends at [phone].

## 2024-06-04 NOTE — Progress Notes (Signed)
 PHARMACY - ANTICOAGULATION CONSULT NOTE  Pharmacy Consult for Coumadin  Indication: h/o DVT/PE  Allergies  Allergen Reactions   Latex Rash   Tape Rash    Patient Measurements: Height: 5' 4 (162.6 cm) Weight: 112.1 kg (247 lb 2.2 oz) IBW/kg (Calculated) : 54.7 HEPARIN DW (KG): 79.1  Vital Signs: Temp: 98.2 F (36.8 C) (09/05 0800) Temp Source: Oral (09/05 0800) BP: 122/82 (09/05 0800) Pulse Rate: 63 (09/05 0800)  Labs: Recent Labs    06/02/24 2337 06/02/24 2342 06/03/24 0546 06/03/24 0647 06/03/24 1445 06/04/24 0255  HGB 13.9  13.9 14.3  --   --  14.2 14.1  HCT 44.0  44.3 42.0  --   --  45.2 43.8  PLT 214  214  --   --   --   --  200  LABPROT  --   --   --  23.2*  --  24.8*  INR  --   --   --  1.9*  --  2.1*  CREATININE 1.24* 1.20*  --   --   --  1.04*  TROPONINIHS  --   --  <2 <2  --   --     Estimated Creatinine Clearance: 79.4 mL/min (A) (by C-G formula based on SCr of 1.04 mg/dL (H)).   Assessment: 48 y.o. F presents with abd pain. Pt with emesis - red in color but pt had just been drinking cheerwine. FOBT negative. Pt on warfarin PTA for h/o DVT/PE.  Confirmed with MD that Coumadin  is to be continued.  INR now therapeutic at 2.1; no bleeding reported.  Home dose: 10mg  on Mon/Thur/Fri and 15 mg Sun/Tues/Wed/Sat  Goal of Therapy:  INR 2-3 Monitor platelets by anticoagulation protocol: Yes   Plan:  Continue home warfarin - 10mg  Mon/Thur/Fri and 15 mg Sun/Tues/Wed/Sat Daily PT/INR F/u EGD result  Genessis Flanary D. Lendell, PharmD, BCPS, BCCCP 06/04/2024, 2:55 PM

## 2024-06-04 NOTE — Plan of Care (Signed)

## 2024-06-04 NOTE — Discharge Instructions (Addendum)
 To Michele Gillespie or their caretakers,  You were recently admitted to [facility] for gastroparesis which is the slowing of your stomach.  This was likely due to the increased dose of Wegovy  that you took in the hospital.  We have prescribed you medication to help increase the motility of your gastrointestinal tract called Reglan  (metoclopramide ).  Additionally, please do not take anymore Wegovy  until you see Dr. Harrie at your next hospital follow-up appointment.  We have increased your dose of pantoprazole  (Protonix ) from 20 mg to 40 mg twice daily.  Continue taking your home medications with the following changes:  Start taking Acetaminophen  (Tylenol ) 500 mg tablet - take 2 tablets by mouth every 6 hours Metoclopramide  (Reglan ) 5 mg every 6 hours Scopolamine  (Transderm-scope) 1 mg every 3 days Pantoprazole  (Protonix ) 40 mg tablet twice daily Stop taking Semaglutide -weight management (Wegovy ) Pantoprazole  (Protonix ) 20 mg daily - see above for increased dose Continue taking Amlodipine  (Norvasc ) 5 mg daily Diclofenac  sodium (Voltaren ) 1% gel - 4 g topically 4 times daily Fluticasone  (Flonase ) 50 mcg/ACT nasal spray Linaclotide  (Linzess ) 72 mcg daily Loratadine  (Claritin ) 10 mg daily as needed for allergies Albuterol  (Ventolin  HFA) 108 (90 base) micrograms/ACT inhaler spacer- inhale 1 to 2 puffs by mouth every 6 hours as needed for wheezing or shortness of breath Warfarin (Coumadin ) 10 mg tablet - take 1 tablet only on Mondays and Thursdays.  All other days take 1-1/2 tablets.   You should seek further medical care if you experience worsening abdominal pain, distention, or do not have any bowel movements for an extended period of time.  Please also seek medical care if you experience chest pain, shortness of breath, unilateral leg swelling, facial droop.  Please follow up with the following doctors/specialties: Please call [facility] [phone] for a follow-up appointment with  the gastroenterologist.   You also have a scheduled appointment with Dr. Lonni Harrie on 06/08/2024. We are so glad that you are feeling better.  Sincerely,  [facility]

## 2024-06-04 NOTE — Anesthesia Postprocedure Evaluation (Signed)
 Anesthesia Post Note  Patient: Michele Gillespie  Procedure(s) Performed: EGD (ESOPHAGOGASTRODUODENOSCOPY)     Patient location during evaluation: PACU Anesthesia Type: General Level of consciousness: sedated and patient cooperative Pain management: pain level controlled Vital Signs Assessment: post-procedure vital signs reviewed and stable Respiratory status: spontaneous breathing Cardiovascular status: stable Anesthetic complications: no   No notable events documented.  Last Vitals:  Vitals:   06/03/24 2010 06/04/24 0434  BP: 130/86 126/88  Pulse: 73 71  Resp: 17 20  Temp: 37 C 36.7 C  SpO2: 94% 94%    Last Pain:  Vitals:   06/04/24 0434  TempSrc: Oral  PainSc:                  [name]

## 2024-06-04 NOTE — Hospital Course (Addendum)
#  Gastroparesis #Gastric Outlet Obstruction Patient presented with symptoms of abdominal pain, nausea, vomiting which are acute on chronic.  She did endorse throwing up some red fluid but did drink red soda before doing so.  She recently started Wegovy  at too high of a dose given notes from last [facility] visit with Dr. Norrine.  CT imaging showed signs concerning for a mass in the duodenum, however GI was consulted and performed endoscopy showing no mass lesions, ulcers, or anatomic correlates with imaging findings.  There was retained food in liquid noted which is likely the mass seen on imaging.  She was started on pantoprazole  40 mg twice daily with recommendations to trial Reglan .  Surgical biopsies were taken from EGD which showed reactive gastropathy, negative for H. pylori.  Her symptoms were deemed to be due to medication-induced gastroparesis.  We have since discontinued her Wegovy  and instructed her to follow-up at the [facility] for further guidance.   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  She was switched to heparin while NPO before her endoscopy.  She was switched back to warfarin with INR at goal upon discharge.  #AKI Creatinine 1.24 on admission up from 0.8 baseline.  On day of discharge her creatinine improved to 1.04.  Her AKI was likely pre-renal due to nausea and vomiting. No reported decrease in urine output.  She received IV fluids during her stay.    #IBS-C Previous history of intermittent issues with constipation and takes Linzess  as needed.  Last bowel movement occurred before hospitalization.   #Obesity Said she took first dose of 2.4 mg semaglutide  on Monday. Pharmacy fill history and notes suggest that this would be a very fast titration due to first fill of 0.25/0.5 mg pen on 05/05/2024 and last office visit on 05/24/2024 indicating that she would start 0.5 mg after that visit.  When asked about this, the  patient stated that she increased from 0.25 mg straight to 2.4 mg without any doses in between.  She has been educated on the appropriate dosing intervals for this medication.  We have since paused her Wegovy .   #Leukopenia  WBC 3.5 on admission and was 3.7 on day of discharge. Stable over the past few years. Has been neutropenic in the past. Afebrile here.    #Hypertension Home medication amlodipine  5 mg daily. Normotensive here.  We held this medication while she was n.p.o. pending her procedure.  She is okay to restart upon discharge.   #Tobacco use disorder Currently down to 1/3 ppd from 2 ppd and she is interested in quitting. Patches and gum have worked in the past.  We used nicotine  patches while she was in the hospital.  She should address this at the next [facility] visit.   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 Jeannetta (Rheumatology) last in 2022 after stopping chloroquine due to side effects of LE weakness. No signs of active flare during this admission but she will likely benefit from follow up with Dr. Jeannetta after discharge.

## 2024-06-06 ENCOUNTER — Encounter (HOSPITAL_COMMUNITY): Payer: Self-pay | Admitting: Pediatrics

## 2024-06-08 ENCOUNTER — Encounter: Payer: Self-pay | Admitting: Student

## 2024-06-08 ENCOUNTER — Ambulatory Visit: Payer: Self-pay | Admitting: Pediatrics

## 2024-06-08 ENCOUNTER — Telehealth (INDEPENDENT_AMBULATORY_CARE_PROVIDER_SITE_OTHER): Payer: MEDICAID | Admitting: Student

## 2024-06-08 DIAGNOSIS — K3189 Other diseases of stomach and duodenum: Secondary | ICD-10-CM | POA: Diagnosis not present

## 2024-06-08 DIAGNOSIS — R634 Abnormal weight loss: Secondary | ICD-10-CM

## 2024-06-08 DIAGNOSIS — E669 Obesity, unspecified: Secondary | ICD-10-CM

## 2024-06-08 DIAGNOSIS — Z7901 Long term (current) use of anticoagulants: Secondary | ICD-10-CM

## 2024-06-08 MED ORDER — SEMAGLUTIDE-WEIGHT MANAGEMENT 1.7 MG/0.75ML ~~LOC~~ SOAJ: 1.7000 mg | SUBCUTANEOUS | 0 refills | Status: AC

## 2024-06-08 MED ORDER — SEMAGLUTIDE-WEIGHT MANAGEMENT 0.25 MG/0.5ML ~~LOC~~ SOAJ: 0.2500 mg | SUBCUTANEOUS | 0 refills | Status: AC

## 2024-06-08 MED ORDER — SEMAGLUTIDE-WEIGHT MANAGEMENT 0.5 MG/0.5ML ~~LOC~~ SOAJ: 0.5000 mg | SUBCUTANEOUS | 0 refills | Status: AC

## 2024-06-08 MED ORDER — SEMAGLUTIDE-WEIGHT MANAGEMENT 1 MG/0.5ML ~~LOC~~ SOAJ: 1.0000 mg | SUBCUTANEOUS | 0 refills | Status: AC

## 2024-06-08 MED ORDER — SEMAGLUTIDE-WEIGHT MANAGEMENT 2.4 MG/0.75ML ~~LOC~~ SOAJ: 2.4000 mg | SUBCUTANEOUS | 0 refills | Status: AC
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
## 2024-06-09 ENCOUNTER — Telehealth: Payer: Self-pay | Admitting: *Deleted

## 2024-06-09 DIAGNOSIS — K3189 Other diseases of stomach and duodenum: Secondary | ICD-10-CM | POA: Insufficient documentation

## 2024-06-09 NOTE — Telephone Encounter (Signed)
 Copied from CRM [id number]. Topic: General - Other >> Jun 09, 2024 12:30 PM Miquel SAILOR wrote: Reason for CRM: MMR Titer Vaccine-Patient requesting on vaccine if they have this in office.Pt has to have it before  09/16 for school or they will drop her class  [phone]

## 2024-06-09 NOTE — Telephone Encounter (Signed)
 Called pt to let her know our office does not give MMR vaccine; and to call her pharmacy or the Health Dept.

## 2024-06-09 NOTE — Assessment & Plan Note (Signed)
 History of protein S/C deficiency with recurrent DVT/PE She has returned to taking her warfarin.  INR is checked by Dr. Sheldon.  She has done this.

## 2024-06-09 NOTE — Assessment & Plan Note (Signed)
 Induced by sudden increase of wegovy  from smallest to largest dose. It was not prescribed improperly, it is not clear to my how the error occurred. In any case, now resolved. EGD in hospital without mass or obstruction.  - N/V resolved. She will advance herself to a regular diet. Was under the impression that she needed to remain on soft diet since returning home. - I will resume her Wegovy .  Since she tolerated the lower dose I think this is reasonable.  Will follow a monthly titration schedule is typical so she will be on 1 month of the 0.25 weekly injection and increased doses of every month. - Encouraged her to follow-up with the gastroenterologists.  Phone number provided.

## 2024-06-11 ENCOUNTER — Emergency Department (HOSPITAL_COMMUNITY)
Admission: EM | Admit: 2024-06-11 | Discharge: 2024-06-11 | Disposition: A | Discharge status: Home / Self Care | Payer: MEDICAID

## 2024-06-11 ENCOUNTER — Other Ambulatory Visit: Payer: Self-pay

## 2024-06-11 ENCOUNTER — Emergency Department (HOSPITAL_COMMUNITY): Payer: MEDICAID

## 2024-06-11 ENCOUNTER — Ambulatory Visit: Payer: Self-pay | Admitting: *Deleted

## 2024-06-11 DIAGNOSIS — Z7901 Long term (current) use of anticoagulants: Secondary | ICD-10-CM | POA: Insufficient documentation

## 2024-06-11 DIAGNOSIS — R109 Unspecified abdominal pain: Secondary | ICD-10-CM

## 2024-06-11 DIAGNOSIS — K5900 Constipation, unspecified: Secondary | ICD-10-CM | POA: Diagnosis not present

## 2024-06-11 DIAGNOSIS — Z79899 Other long term (current) drug therapy: Secondary | ICD-10-CM | POA: Insufficient documentation

## 2024-06-11 DIAGNOSIS — Z91040 Latex allergy status: Secondary | ICD-10-CM | POA: Insufficient documentation

## 2024-06-11 LAB — I-STAT CHEM 8, ED
BUN: 13 mg/dL (ref 6–20)
Calcium, Ion: 1.14 mmol/L — ABNORMAL LOW (ref 1.15–1.40)
Chloride: 107 mmol/L (ref 98–111)
Creatinine, Ser: 1.1 mg/dL — ABNORMAL HIGH (ref 0.44–1.00)
Glucose, Bld: 93 mg/dL (ref 70–99)
HCT: 46 % (ref 36.0–46.0)
Hemoglobin: 15.6 g/dL — ABNORMAL HIGH (ref 12.0–15.0)
Potassium: 3.8 mmol/L (ref 3.5–5.1)
Sodium: 142 mmol/L (ref 135–145)
TCO2: 25 mmol/L (ref 22–32)

## 2024-06-11 LAB — COMPREHENSIVE METABOLIC PANEL WITH GFR
ALT: 18 U/L (ref 0–44)
AST: 16 U/L (ref 15–41)
Albumin: 3.7 g/dL (ref 3.5–5.0)
Alkaline Phosphatase: 32 U/L — ABNORMAL LOW (ref 38–126)
Anion gap: 10 (ref 5–15)
BUN: 11 mg/dL (ref 6–20)
CO2: 24 mmol/L (ref 22–32)
Calcium: 9.3 mg/dL (ref 8.9–10.3)
Chloride: 108 mmol/L (ref 98–111)
Creatinine, Ser: 1.1 mg/dL — ABNORMAL HIGH (ref 0.44–1.00)
GFR, Estimated: >60 mL/min (ref >60)
Glucose, Bld: 97 mg/dL (ref 70–99)
Potassium: 3.7 mmol/L (ref 3.5–5.1)
Sodium: 142 mmol/L (ref 135–145)
Total Bilirubin: 0.6 mg/dL (ref 0.0–1.2)
Total Protein: 7 g/dL (ref 6.5–8.1)

## 2024-06-11 LAB — LIPASE, BLOOD: Lipase: 35 U/L (ref 11–51)

## 2024-06-11 LAB — CBC
HCT: 46.7 % — ABNORMAL HIGH (ref 36.0–46.0)
Hemoglobin: 14.5 g/dL (ref 12.0–15.0)
MCH: 25 pg — ABNORMAL LOW (ref 26.0–34.0)
MCHC: 31 g/dL (ref 30.0–36.0)
MCV: 80.4 fL (ref 80.0–100.0)
Platelets: 198 K/uL (ref 150–400)
RBC: 5.81 MIL/uL — ABNORMAL HIGH (ref 3.87–5.11)
RDW: 14.6 % (ref 11.5–15.5)
WBC: 3 K/uL — ABNORMAL LOW (ref 4.0–10.5)
nRBC: 0 % (ref 0.0–0.2)

## 2024-06-11 LAB — URINALYSIS, ROUTINE W REFLEX MICROSCOPIC
Bacteria, UA: NONE SEEN
Bilirubin Urine: NEGATIVE
Glucose, UA: NEGATIVE mg/dL
Ketones, ur: NEGATIVE mg/dL
Nitrite: NEGATIVE
Protein, ur: NEGATIVE mg/dL
Specific Gravity, Urine: 1.026 (ref 1.005–1.030)
pH: 5 (ref 5.0–8.0)

## 2024-06-11 LAB — HCG, SERUM, QUALITATIVE: Preg, Serum: NEGATIVE

## 2024-06-11 MED ORDER — DOCUSATE SODIUM 100 MG PO CAPS
100.0000 mg | ORAL_CAPSULE | Freq: Two times a day (BID) | ORAL | 0 refills | Status: AC | PRN
Start: 2024-06-11 — End: 2024-06-18

## 2024-06-11 NOTE — ED Notes (Signed)
 Pt outside for smoke break

## 2024-06-11 NOTE — ED Triage Notes (Signed)
 Pt. Stated, I'm having stomach pain with nauseous. Ive not had a bowel movement since Sept 3, 2025. They said I had a bowel blockage.

## 2024-06-11 NOTE — ED Triage Notes (Signed)
 Pt here from home with c/o abd pain , pt was seen approx  10 days with same ,

## 2024-06-11 NOTE — Telephone Encounter (Signed)
 Copied from CRM #[id number]. Topic: Clinical - Red Word Triage >> Jun 11, 2024  9:10 AM Farrel B wrote: [facility] that prompted transfer to Nurse Triage: just discharged from hospital on the 3rd of pain, has not had a bowel movement since being discharge severe pain, nauseous, no appetite, pain is so severe she has not been able to go back to work. Reason for Disposition  [1] Constant abdominal pain AND [2] present > 2 hours    No BM since Sept 3rd.  Had procedure in hospital and told she has a blockage in her intestine.   They didn't do anything.   On the way to [facility] ED now.  Answer Assessment - Initial Assessment Questions 1. STOOL PATTERN OR FREQUENCY: How often do you have a bowel movement (BM)?  (Normal range: 3 times a day to every 3 days)  When was your last BM?       I was discharged from hospital sept 5th.   I've not had a BM since I went to the hospital on the 3rd.    They did a procedure where they put a camera in my throat to see if I had a blockage in my intestine.   I had a blockage.  They didn't do anything.   They didn't do anything for the blockage.  Told me to eat smaller portions and soft foods.   2 days ago the dr told me to eat solid foods but it's making me sick and I can't have a BM.      Last night I had to leave work because I wasn't feeling good   I was thinking about going back to the ER.   My stomach is hurting so bad.  I just feel pain in my abd.    I went to [facility] for the procedure so I'm going to go back over there to the ER now.   I'm on my way now. 2. STRAINING: Do you have to strain to have a BM?      Nothing is coming out.  3. ONSET: When did the constipation begin?     Since I was in hospital Sept 3rd. 4. RECTAL PAIN: Does your rectum hurt when the stool comes out? If Yes, ask: Do you have hemorrhoids? How bad is the pain?  (Scale 1-10; or mild, moderate, severe)     Not asked since unable to pass BM 5. BM COMPOSITION: Are the stools hard?      No  stools 6. BLOOD ON STOOLS: Has there been any blood on the toilet tissue or on the surface of the BM? If Yes, ask: When was the last time?     No stools 7. CHRONIC CONSTIPATION: Is this a new problem for you?  If No, ask: How long have you had this problem? (days, weeks, months)      No    8. CHANGES IN DIET OR HYDRATION: Have there been any recent changes in your diet? How much fluids are you drinking on a daily basis?  How much have you had to drink today?     The dr told me 2 days ago to start eating solid foods but it's making me sick.    I can't eat solid foods because I can't go to the bathroom yet. 9. MEDICINES: Have you been taking any new medicines? Are you taking any narcotic pain medicines? (e.g., Dilaudid , morphine , Percocet, Vicodin)     Not asked.    Pt  is on her way to the ED now. 10. LAXATIVES: Have you been using any stool softeners, laxatives, or enemas?  If Yes, ask What are you using, how often, and when was the last time?       Not asked as pt is on the way to the ED now. 11. ACTIVITY:  How much walking do you do every day?  Has your activity level decreased in the past week?        Not asked 12. CAUSE: What do you think is causing the constipation?        I have a a blockage but they didn't do anything about it. 13. MEDICAL HISTORY: Do you have a history of hemorrhoids, rectal fissures, rectal surgery, or rectal abscess?         Not asked 14. OTHER SYMPTOMS: Do you have any other symptoms? (e.g., abdomen pain, bloating, fever, vomiting)       Bad stomach pain and unable to have a BM since Sept. 3rd. 15. PREGNANCY: Is there any chance you are pregnant? When was your last menstrual period?       Not asked  Protocols used: Constipation-A-AH FYI Only or Action Required?: FYI only for provider.  Patient was last seen in primary care on 06/08/2024 by Harrie Bruckner, DO.  Called Nurse Triage reporting Constipation.No BM since Sept. 3rd.    Discharged from hospital 9/6.  Had a procedure done and was told she has a blockage.   C/O bad stomach pain.   Had to leave work last night.   On the way to [facility] ED now.  Symptoms began several weeks ago. Since Sept 3rd, 2025  Interventions attempted: Other: Had a procedure done in hospital.    Dr. Odette her 2 days ago to start solid food but it's making her sick. Because she can't have a BM.  Symptoms are: rapidly worsening.  Triage Disposition: See HCP Within 4 Hours (Or PCP Triage)Pt on the way to [facility] ED.   Called in to give an update.  Message sent to the practice.  Patient/caregiver understands and will follow disposition?: Yes

## 2024-06-11 NOTE — Telephone Encounter (Signed)
Per chart, pt is in the ER.

## 2024-06-11 NOTE — Discharge Instructions (Signed)
 Continue your bowel regimen as directed by GI and your primary doctor.  Call GI and make a follow-up appointment with them.  Continue your Linzess  as prescribed.  You can use Colace as needed for constipation.

## 2024-06-11 NOTE — ED Provider Notes (Signed)
 [facility] AT [facility] Provider Note   CSN: [id number] Arrival date & time: 06/11/24  9062     Patient presents with: Abdominal Pain, Nausea, and Constipation   Michele Gillespie is a 48 y.o. female.   48 year old female presents for evaluation of abdominal pain nausea and constipation.  She states the she was here 10 days ago for the same thing has not had a bowel movement.  She was started on Linzess  and other medications to help with this.  She has had some nausea but no vomiting.  She states she has had a decreased appetite as well.  She denies any other symptoms or concerns at this time.   Abdominal Pain Associated symptoms: constipation and nausea   Associated symptoms: no chest pain, no chills, no cough, no dysuria, no fever, no hematuria, no shortness of breath, no sore throat and no vomiting   Constipation Associated symptoms: abdominal pain and nausea   Associated symptoms: no back pain, no dysuria, no fever and no vomiting        Prior to Admission medications   Medication Sig Start Date End Date Taking? Authorizing Provider  docusate sodium  (COLACE) 100 MG capsule Take 1 capsule (100 mg total) by mouth every 12 (twelve) hours as needed for up to 7 days for mild constipation. 06/11/24 06/18/24 Yes Chelle Cayton L, DO  acetaminophen  (TYLENOL ) 500 MG tablet Take 2 tablets (1,000 mg total) by mouth every 6 (six) hours. 06/04/24   Waymond Cart, MD  amLODipine  (NORVASC ) 5 MG tablet Take 1 tablet (5 mg total) by mouth daily. 09/29/23   Addie Perkins, DO  diclofenac  Sodium (VOLTAREN ) 1 % GEL Apply 4 g topically 4 (four) times daily. 06/04/24   Waymond Cart, MD  fluticasone  (FLONASE ) 50 MCG/ACT nasal spray Place 1 spray into both nostrils daily. 12/21/21   Hazen Darryle BRAVO, FNP  loratadine  (CLARITIN ) 10 MG tablet Take 1 tablet (10 mg total) by mouth daily as needed for allergies. 05/26/24   Harrie Bruckner, DO  metoCLOPramide  (REGLAN ) 5 MG tablet Take 1  tablet (5 mg total) by mouth every 6 (six) hours. 06/04/24   Waymond Cart, MD  pantoprazole  (PROTONIX ) 40 MG tablet Take 1 tablet (40 mg total) by mouth 2 (two) times daily. 06/04/24   Waymond Cart, MD  scopolamine  (TRANSDERM-SCOP) 1 MG/3DAYS Place 1 patch (1 mg total) onto the skin every 3 (three) days. 06/06/24   Waymond Cart, MD  semaglutide -weight management (WEGOVY ) 0.25 MG/0.5ML SOAJ SQ injection Inject 0.25 mg into the skin once a week for 28 days. 06/08/24 07/06/24  Harrie Bruckner, DO  semaglutide -weight management (WEGOVY ) 0.5 MG/0.5ML SOAJ SQ injection Inject 0.5 mg into the skin once a week for 28 days. 07/07/24 08/04/24  Harrie Bruckner, DO  semaglutide -weight management (WEGOVY ) 1 MG/0.5ML SOAJ SQ injection Inject 1 mg into the skin once a week for 28 days. 08/05/24 09/02/24  Harrie Bruckner, DO  semaglutide -weight management (WEGOVY ) 1.7 MG/0.75ML SOAJ SQ injection Inject 1.7 mg into the skin once a week for 28 days. 09/03/24 10/01/24  Harrie Bruckner, DO  semaglutide -weight management (WEGOVY ) 2.4 MG/0.75ML SOAJ SQ injection Inject 2.4 mg into the skin once a week for 28 days. 10/02/24 10/30/24  Harrie Bruckner, DO  VENTOLIN  HFA 108 (90 Base) MCG/ACT inhaler INHALE 1-2 PUFFS BY MOUTH EVERY 6 HOURS AS NEEDED FOR WHEEZE OR SHORTNESS OF BREATH Patient taking differently: Inhale 1 puff into the lungs every 6 (six) hours as needed for shortness of breath.  01/23/24   Harrie Bruckner, DO  warfarin (COUMADIN ) 10 MG tablet Take one (1) tablet only on Mondays and Thursdays. All other days, take one and one half (1&1/2) tablets. Patient taking differently: Take 10-15 mg by mouth See admin instructions. Take one (1) tablet by mouth only on Mondays and Thursdays. All other days, take one and one half (1&1/2) tablets by mouth 05/24/24   Ruthell Lynwood NOVAK, RPH-CPP    Allergies: Latex and Tape    Review of Systems  Constitutional:  Negative for chills and fever.  HENT:  Negative for ear pain and sore  throat.   Eyes:  Negative for pain and visual disturbance.  Respiratory:  Negative for cough and shortness of breath.   Cardiovascular:  Negative for chest pain and palpitations.  Gastrointestinal:  Positive for abdominal pain, constipation and nausea. Negative for vomiting.  Genitourinary:  Negative for dysuria and hematuria.  Musculoskeletal:  Negative for arthralgias and back pain.  Skin:  Negative for color change and rash.  Neurological:  Negative for seizures and syncope.  All other systems reviewed and are negative.   Updated Vital Signs BP (!) 144/99 (BP Location: Left Arm)   Pulse 78   Temp 98 F (36.7 C)   Resp 16   SpO2 97%   Physical Exam Vitals and nursing note reviewed.  Constitutional:      General: She is not in acute distress.    Appearance: She is well-developed. She is not ill-appearing.  HENT:     Head: Normocephalic and atraumatic.  Eyes:     Conjunctiva/sclera: Conjunctivae normal.  Cardiovascular:     Rate and Rhythm: Normal rate and regular rhythm.     Heart sounds: No murmur heard. Pulmonary:     Effort: Pulmonary effort is normal. No respiratory distress.     Breath sounds: Normal breath sounds.  Abdominal:     General: Abdomen is flat.     Palpations: Abdomen is soft.     Tenderness: There is no abdominal tenderness.  Musculoskeletal:        General: No swelling.     Cervical back: Neck supple.  Skin:    General: Skin is warm and dry.     Capillary Refill: Capillary refill takes less than 2 seconds.  Neurological:     Mental Status: She is alert.  Psychiatric:        Mood and Affect: Mood normal.     (all labs ordered are listed, but only abnormal results are displayed) Labs Reviewed  COMPREHENSIVE METABOLIC PANEL WITH GFR - Abnormal; Notable for the following components:      Result Value   Creatinine, Ser 1.10 (*)    Alkaline Phosphatase 32 (*)    All other components within normal limits  CBC - Abnormal; Notable for the  following components:   WBC 3.0 (*)    RBC 5.81 (*)    HCT 46.7 (*)    MCH 25.0 (*)    All other components within normal limits  URINALYSIS, ROUTINE W REFLEX MICROSCOPIC - Abnormal; Notable for the following components:   APPearance HAZY (*)    Hgb urine dipstick SMALL (*)    Leukocytes,Ua MODERATE (*)    All other components within normal limits  I-STAT CHEM 8, ED - Abnormal; Notable for the following components:   Creatinine, Ser 1.10 (*)    Calcium, Ion 1.14 (*)    Hemoglobin 15.6 (*)    All other components within normal limits  LIPASE, BLOOD  HCG, SERUM, QUALITATIVE    EKG: None  Radiology: CT ABDOMEN PELVIS WO CONTRAST Result Date: 06/11/2024 CLINICAL DATA:  Abdominal pain, acute, nonlocalized. EXAM: CT ABDOMEN AND PELVIS WITHOUT CONTRAST TECHNIQUE: Multidetector CT imaging of the abdomen and pelvis was performed following the standard protocol without IV contrast. RADIATION DOSE REDUCTION: This exam was performed according to the departmental dose-optimization program which includes automated exposure control, adjustment of the mA and/or kV according to patient size and/or use of iterative reconstruction technique. COMPARISON:  CT scan abdomen and pelvis from 06/04/2024. FINDINGS: Lower chest: There are patchy atelectatic changes in the visualized lung bases. No overt consolidation. No pleural effusion. The heart is normal in size. No pericardial effusion. Hepatobiliary: The liver is normal in size. Non-cirrhotic configuration. No suspicious mass. No intrahepatic or extrahepatic bile duct dilation. No calcified gallstones. Normal gallbladder wall thickness. No pericholecystic inflammatory changes. Pancreas: Unremarkable. No pancreatic ductal dilatation or surrounding inflammatory changes. Spleen: Within normal limits. No focal lesion. Adrenals/Urinary Tract: Adrenal glands are unremarkable. No suspicious renal mass within the limitations of this unenhanced exam. No  nephroureterolithiasis or obstructive uropathy. Urinary bladder is under distended, precluding optimal assessment. However, no large mass or stones identified. No perivesical fat stranding. Stomach/Bowel: There is a tiny sliding hiatal hernia. Focal soft tissue filling defect in the proximal duodenal described on the recent CT scan is not well appreciated on this exam which is performed without oral and intravenous contrast. No disproportionate dilation of the small or large bowel loops. No evidence of abnormal bowel wall thickening or inflammatory changes. The appendix is unremarkable. There are multiple diverticula mainly in the left hemi colon, without imaging signs of diverticulitis. Vascular/Lymphatic: No ascites or pneumoperitoneum. No abdominal or pelvic lymphadenopathy, by size criteria. No aneurysmal dilation of the major abdominal arteries. There are mild peripheral atherosclerotic vascular calcifications of the aorta and its major branches. Reproductive: Not well evaluated on the CT scan exam. However, having said that, note is made of normal-size anteverted uterus. No focal mass. Bilateral adnexa are within normal limits. Other: There is periumbilical and infraumbilical midline surgical scar. The soft tissues and abdominal wall are otherwise unremarkable. Musculoskeletal: No suspicious osseous lesions. There are mild multilevel degenerative changes in the visualized spine. IMPRESSION: 1. No acute inflammatory process identified within the abdomen or pelvis. No nephroureterolithiasis or obstructive uropathy. 2. Multiple other nonacute observations, as described above. Aortic Atherosclerosis (ICD10-I70.0). Electronically Signed   By: Ree Molt M.D.   On: 06/11/2024 14:18     Procedures   Medications Ordered in the ED - No data to display                                  Medical Decision Making Patient here for ongoing abdominal pain.  CT abdomen pelvis as well as lab work is unremarkable.   She states she is having difficulties with bowel movement.  Will start her on Colace to use as needed for constipation.  She has Zofran  at home to use as needed for nausea and she takes Linzess  daily.  I discussed with her to obtain follow-up with GI and her primary care doctor and otherwise return to the ER for new or worsening symptoms.  She feels comfortable to plan be discharged home.  Problems Addressed: Abdominal pain, non-surgical: undiagnosed new problem with uncertain prognosis Constipation, unspecified constipation type: undiagnosed new problem with uncertain prognosis  Amount and/or Complexity of Data Reviewed  External Data Reviewed: notes.    Details: Prior ED records reviewed at today's ago patient was here for similar symptoms with negative workup Labs: ordered. Decision-making details documented in ED Course.    Details: Ordered and reviewed by me and unremarkable Radiology: ordered and independent interpretation performed. Decision-making details documented in ED Course.    Details: Ordered and reviewed by me and show no obstruction or acute process in the abdomen  Risk OTC drugs. Prescription drug management.     Final diagnoses:  Constipation, unspecified constipation type  Abdominal pain, non-surgical    ED Discharge Orders          Ordered    docusate sodium  (COLACE) 100 MG capsule  Every 12 hours PRN        06/11/24 1639               Markita Stcharles L, DO 06/11/24 1646

## 2024-06-14 NOTE — Progress Notes (Signed)
 Internal Medicine Clinic Attending  Case discussed with the resident at the time of the visit.  We reviewed the resident's history and exam and pertinent patient test results.  I agree with the assessment, diagnosis, and plan of care documented in the resident's note.

## 2024-06-23 ENCOUNTER — Other Ambulatory Visit: Payer: Self-pay | Admitting: Student

## 2024-06-23 NOTE — Telephone Encounter (Signed)
 Pharmacy requesting a 90 day supply  Medication sent to pharmacy.

## 2024-06-24 NOTE — Telephone Encounter (Signed)
 Message sent to [facility] to f/u with the FMLA forms.  Copied from CRM [id number]. Topic: General - Other >> Jun 24, 2024  3:18 PM Merlynn A wrote: Reason for CRM: Patients employer Delon [employer] from [employer] called in regarding patients FMLA paperwork. Stated that the paperwork was completed in error. Page 1 needs to be corrected. Please contact to correct. She can be reached at [phone] option 1 extension [phone].

## 2024-06-30 ENCOUNTER — Emergency Department (HOSPITAL_COMMUNITY)
Admission: EM | Admit: 2024-06-30 | Discharge: 2024-07-01 | Disposition: A | Discharge status: Home / Self Care | Payer: MEDICAID | Attending: Emergency Medicine | Admitting: Emergency Medicine

## 2024-06-30 ENCOUNTER — Encounter (HOSPITAL_COMMUNITY): Payer: Self-pay | Admitting: *Deleted

## 2024-06-30 ENCOUNTER — Other Ambulatory Visit: Payer: Self-pay

## 2024-06-30 DIAGNOSIS — R1013 Epigastric pain: Secondary | ICD-10-CM | POA: Diagnosis not present

## 2024-06-30 DIAGNOSIS — E876 Hypokalemia: Secondary | ICD-10-CM | POA: Insufficient documentation

## 2024-06-30 DIAGNOSIS — R1032 Left lower quadrant pain: Secondary | ICD-10-CM | POA: Insufficient documentation

## 2024-06-30 DIAGNOSIS — Z7901 Long term (current) use of anticoagulants: Secondary | ICD-10-CM | POA: Insufficient documentation

## 2024-06-30 DIAGNOSIS — I10 Essential (primary) hypertension: Secondary | ICD-10-CM | POA: Diagnosis not present

## 2024-06-30 DIAGNOSIS — R112 Nausea with vomiting, unspecified: Secondary | ICD-10-CM | POA: Insufficient documentation

## 2024-06-30 DIAGNOSIS — Z91040 Latex allergy status: Secondary | ICD-10-CM | POA: Diagnosis not present

## 2024-06-30 LAB — URINALYSIS, ROUTINE W REFLEX MICROSCOPIC
Bilirubin Urine: NEGATIVE
Glucose, UA: NEGATIVE mg/dL
Ketones, ur: NEGATIVE mg/dL
Leukocytes,Ua: NEGATIVE
Nitrite: NEGATIVE
Protein, ur: 30 mg/dL — AB
Specific Gravity, Urine: 1.021 (ref 1.005–1.030)
pH: 5 (ref 5.0–8.0)

## 2024-06-30 LAB — COMPREHENSIVE METABOLIC PANEL WITH GFR
ALT: 21 U/L (ref 0–44)
AST: 18 U/L (ref 15–41)
Albumin: 3.6 g/dL (ref 3.5–5.0)
Alkaline Phosphatase: 32 U/L — ABNORMAL LOW (ref 38–126)
Anion gap: 10 (ref 5–15)
BUN: 7 mg/dL (ref 6–20)
CO2: 24 mmol/L (ref 22–32)
Calcium: 9.3 mg/dL (ref 8.9–10.3)
Chloride: 106 mmol/L (ref 98–111)
Creatinine, Ser: 0.93 mg/dL (ref 0.44–1.00)
GFR, Estimated: >60 mL/min (ref >60)
Glucose, Bld: 89 mg/dL (ref 70–99)
Potassium: 3.3 mmol/L — ABNORMAL LOW (ref 3.5–5.1)
Sodium: 140 mmol/L (ref 135–145)
Total Bilirubin: 0.9 mg/dL (ref 0.0–1.2)
Total Protein: 6.8 g/dL (ref 6.5–8.1)

## 2024-06-30 LAB — CBC
HCT: 47.3 % — ABNORMAL HIGH (ref 36.0–46.0)
Hemoglobin: 14.6 g/dL (ref 12.0–15.0)
MCH: 24.8 pg — ABNORMAL LOW (ref 26.0–34.0)
MCHC: 30.9 g/dL (ref 30.0–36.0)
MCV: 80.3 fL (ref 80.0–100.0)
Platelets: 210 K/uL (ref 150–400)
RBC: 5.89 MIL/uL — ABNORMAL HIGH (ref 3.87–5.11)
RDW: 14.7 % (ref 11.5–15.5)
WBC: 3.3 K/uL — ABNORMAL LOW (ref 4.0–10.5)
nRBC: 0 % (ref 0.0–0.2)

## 2024-06-30 LAB — LIPASE, BLOOD: Lipase: 14 U/L (ref 11–51)

## 2024-06-30 LAB — HCG, SERUM, QUALITATIVE: Preg, Serum: POSITIVE — AB

## 2024-06-30 LAB — HCG, QUANTITATIVE, PREGNANCY: hCG, Beta Chain, Quant, S: 7 m[IU]/mL — ABNORMAL HIGH (ref <5)

## 2024-06-30 MED ORDER — ONDANSETRON 4 MG PO TBDP
4.0000 mg | ORAL_TABLET | Freq: Once | ORAL | Status: AC | PRN
  Administered 2024-06-30: 4 mg via ORAL
  Filled 2024-06-30: qty 1

## 2024-06-30 MED ORDER — OXYCODONE-ACETAMINOPHEN 5-325 MG PO TABS
1.0000 | ORAL_TABLET | Freq: Once | ORAL | Status: AC
  Administered 2024-06-30: 1 via ORAL
  Filled 2024-06-30: qty 1

## 2024-06-30 NOTE — ED Provider Triage Note (Signed)
 Emergency Medicine Provider Triage Evaluation Note  Michele Gillespie , a 48 y.o. female  was evaluated in triage.  Pt complains of vomiting and abdominal pain, onset last night. Previously admitted 06/02/34 for bowel obstruction.  Last bowel movement yesterday afternoon. Not passing flatus today.  No prior abdominal surgeries. Review of Systems  Positive:  Negative:   Physical Exam  BP (!) 132/91   Pulse 72   Temp 100 F (37.8 C)   Resp 17   Ht 5' 4 (1.626 m)   Wt 112.1 kg   SpO2 100%   BMI 42.42 kg/m  Gen:   Awake, no distress   Resp:  Normal effort  MSK:   Moves extremities without difficulty  Other:    Medical Decision Making  Medically screening exam initiated at 7:33 PM.  Appropriate orders placed.  Michele Gillespie was informed that the remainder of the evaluation will be completed by another provider, this initial triage assessment does not replace that evaluation, and the importance of remaining in the ED until their evaluation is complete.     Beverley Leita LABOR, PA-C 06/30/24 1934

## 2024-06-30 NOTE — ED Triage Notes (Signed)
 PT arrives via POV. Pt c/o abdominal pain, nausea, and vomiting since last night. Pt is AxOx4.

## 2024-07-01 ENCOUNTER — Emergency Department (HOSPITAL_COMMUNITY): Payer: MEDICAID

## 2024-07-01 MED ORDER — IOHEXOL 350 MG/ML SOLN
75.0000 mL | Freq: Once | INTRAVENOUS | Status: AC | PRN
  Administered 2024-07-01: 75 mL via INTRAVENOUS

## 2024-07-01 MED ORDER — ONDANSETRON 4 MG PO TBDP: 4.0000 mg | ORAL_TABLET | Freq: Three times a day (TID) | ORAL | 0 refills | Status: AC | PRN

## 2024-07-01 MED ORDER — POTASSIUM CHLORIDE CRYS ER 20 MEQ PO TBCR
40.0000 meq | EXTENDED_RELEASE_TABLET | Freq: Once | ORAL | Status: AC
  Administered 2024-07-01: 40 meq via ORAL
  Filled 2024-07-01: qty 2

## 2024-07-01 MED ORDER — ACETAMINOPHEN 325 MG PO TABS
650.0000 mg | ORAL_TABLET | Freq: Once | ORAL | Status: AC
  Administered 2024-07-01: 650 mg via ORAL
  Filled 2024-07-01: qty 2

## 2024-07-01 MED ORDER — SODIUM CHLORIDE 0.9 % IV BOLUS
1000.0000 mL | Freq: Once | INTRAVENOUS | Status: AC
  Administered 2024-07-01: 1000 mL via INTRAVENOUS

## 2024-07-01 MED ORDER — ONDANSETRON HCL 4 MG/2ML IJ SOLN
4.0000 mg | Freq: Once | INTRAMUSCULAR | Status: AC
  Administered 2024-07-01: 4 mg via INTRAVENOUS
  Filled 2024-07-01: qty 2

## 2024-07-01 NOTE — ED Notes (Signed)
Rounded on patient, patient not in room at this time.

## 2024-07-01 NOTE — Discharge Instructions (Addendum)
 As discussed, you will need to follow-up with your primary care provider.  Seek emergency care if experiencing any new or worsening symptoms.

## 2024-07-01 NOTE — ED Provider Notes (Signed)
 [place] EMERGENCY DEPARTMENT AT [facility] Provider Note   CSN: [id number] Arrival date & time: 06/30/24  1821     Patient presents with: Abdominal Pain and Emesis   Michele Gillespie is a 48 y.o. female with PMHx GERD, HTN, IBS, diverticulosis, HLD, prediabetes who presents to ED concerned for nausea, vomiting, and abdominal pain that started yesterday. Pain is epigastric and LLQ. Patient endorsing increased dosage of WeGovy  injection yesterday. Denies hematemesis, hematochezia, diarrhea. Endorses having a fever documented in triage earlier today of 100.47F. Denies sick contact or recent suspicious food intake.     Abdominal Pain Associated symptoms: vomiting   Emesis Associated symptoms: abdominal pain        Prior to Admission medications   Medication Sig Start Date End Date Taking? Authorizing Provider  ondansetron  (ZOFRAN -ODT) 4 MG disintegrating tablet Take 1 tablet (4 mg total) by mouth every 8 (eight) hours as needed for nausea. 07/01/24  Yes Hoy Fraction F, PA-C  acetaminophen  (TYLENOL ) 500 MG tablet Take 2 tablets (1,000 mg total) by mouth every 6 (six) hours. 06/04/24   Waymond Cart, MD  amLODipine  (NORVASC ) 5 MG tablet Take 1 tablet (5 mg total) by mouth daily. 09/29/23   Addie Perkins, DO  diclofenac  Sodium (VOLTAREN ) 1 % GEL Apply 4 g topically 4 (four) times daily. 06/04/24   Waymond Cart, MD  fluticasone  (FLONASE ) 50 MCG/ACT nasal spray Place 1 spray into both nostrils daily. 12/21/21   Hazen Darryle BRAVO, FNP  loratadine  (CLARITIN ) 10 MG tablet TAKE 1 TABLET BY MOUTH EVERY DAY AS NEEDED FOR ALLERGY 06/23/24   Harrie Bruckner, DO  metoCLOPramide  (REGLAN ) 5 MG tablet Take 1 tablet (5 mg total) by mouth every 6 (six) hours. 06/04/24   Waymond Cart, MD  pantoprazole  (PROTONIX ) 40 MG tablet Take 1 tablet (40 mg total) by mouth 2 (two) times daily. 06/04/24   Waymond Cart, MD  scopolamine  (TRANSDERM-SCOP) 1 MG/3DAYS Place 1 patch (1 mg total) onto the skin every 3 (three)  days. 06/06/24   Waymond Cart, MD  semaglutide -weight management (WEGOVY ) 0.25 MG/0.5ML SOAJ SQ injection Inject 0.25 mg into the skin once a week for 28 days. 06/08/24 07/06/24  Harrie Bruckner, DO  semaglutide -weight management (WEGOVY ) 0.5 MG/0.5ML SOAJ SQ injection Inject 0.5 mg into the skin once a week for 28 days. 07/07/24 08/04/24  Harrie Bruckner, DO  semaglutide -weight management (WEGOVY ) 1 MG/0.5ML SOAJ SQ injection Inject 1 mg into the skin once a week for 28 days. 08/05/24 09/02/24  Harrie Bruckner, DO  semaglutide -weight management (WEGOVY ) 1.7 MG/0.75ML SOAJ SQ injection Inject 1.7 mg into the skin once a week for 28 days. 09/03/24 10/01/24  Harrie Bruckner, DO  semaglutide -weight management (WEGOVY ) 2.4 MG/0.75ML SOAJ SQ injection Inject 2.4 mg into the skin once a week for 28 days. 10/02/24 10/30/24  Harrie Bruckner, DO  VENTOLIN  HFA 108 (90 Base) MCG/ACT inhaler INHALE 1-2 PUFFS BY MOUTH EVERY 6 HOURS AS NEEDED FOR WHEEZE OR SHORTNESS OF BREATH Patient taking differently: Inhale 1 puff into the lungs every 6 (six) hours as needed for shortness of breath. 01/23/24   Harrie Bruckner, DO  warfarin (COUMADIN ) 10 MG tablet Take one (1) tablet only on Mondays and Thursdays. All other days, take one and one half (1&1/2) tablets. Patient taking differently: Take 10-15 mg by mouth See admin instructions. Take one (1) tablet by mouth only on Mondays and Thursdays. All other days, take one and one half (1&1/2) tablets by mouth 05/24/24   Ruthell Lynwood NOVAK,  RPH-CPP    Allergies: Latex and Tape    Review of Systems  Gastrointestinal:  Positive for abdominal pain and vomiting.    Updated Vital Signs BP 116/78   Pulse 74   Temp 98.3 F (36.8 C) (Oral)   Resp 15   Ht 5' 4 (1.626 m)   Wt 112.1 kg   SpO2 96%   BMI 42.42 kg/m   Physical Exam Vitals and nursing note reviewed.  Constitutional:      General: She is not in acute distress.    Appearance: She is not ill-appearing or  toxic-appearing.  HENT:     Head: Normocephalic and atraumatic.     Mouth/Throat:     Mouth: Mucous membranes are moist.     Pharynx: No posterior oropharyngeal erythema.  Eyes:     General: No scleral icterus.       Right eye: No discharge.        Left eye: No discharge.     Conjunctiva/sclera: Conjunctivae normal.  Cardiovascular:     Rate and Rhythm: Normal rate and regular rhythm.     Pulses: Normal pulses.     Heart sounds: Normal heart sounds. No murmur heard. Pulmonary:     Effort: Pulmonary effort is normal. No respiratory distress.     Breath sounds: Normal breath sounds. No wheezing, rhonchi or rales.  Abdominal:     General: Abdomen is flat. Bowel sounds are normal. There is no distension.     Palpations: Abdomen is soft. There is no mass.     Tenderness: There is abdominal tenderness in the epigastric area and left lower quadrant.  Musculoskeletal:     Right lower leg: No edema.     Left lower leg: No edema.  Skin:    General: Skin is warm and dry.     Findings: No rash.  Neurological:     General: No focal deficit present.     Mental Status: She is alert and oriented to person, place, and time. Mental status is at baseline.  Psychiatric:        Mood and Affect: Mood normal.        Behavior: Behavior normal.     (all labs ordered are listed, but only abnormal results are displayed) Labs Reviewed  COMPREHENSIVE METABOLIC PANEL WITH GFR - Abnormal; Notable for the following components:      Result Value   Potassium 3.3 (*)    Alkaline Phosphatase 32 (*)    All other components within normal limits  CBC - Abnormal; Notable for the following components:   WBC 3.3 (*)    RBC 5.89 (*)    HCT 47.3 (*)    MCH 24.8 (*)    All other components within normal limits  URINALYSIS, ROUTINE W REFLEX MICROSCOPIC - Abnormal; Notable for the following components:   APPearance HAZY (*)    Hgb urine dipstick SMALL (*)    Protein, ur 30 (*)    Bacteria, UA RARE (*)    All  other components within normal limits  HCG, SERUM, QUALITATIVE - Abnormal; Notable for the following components:   Preg, Serum POSITIVE (*)    All other components within normal limits  HCG, QUANTITATIVE, PREGNANCY - Abnormal; Notable for the following components:   hCG, Beta Chain, Quant, S 7 (*)    All other components within normal limits  LIPASE, BLOOD    EKG: None  Radiology: CT ABDOMEN PELVIS W CONTRAST Result Date: 07/01/2024 EXAM: CT ABDOMEN AND  PELVIS WITH CONTRAST 07/01/2024 03:47:56 AM TECHNIQUE: CT of the abdomen and pelvis was performed with the administration of 75 mL of iohexol  (OMNIPAQUE ) 350 MG/ML injection. Multiplanar reformatted images are provided for review. Automated exposure control, iterative reconstruction, and/or weight-based adjustment of the mA/kV was utilized to reduce the radiation dose to as low as reasonably achievable. COMPARISON: Comparison with 06/11/2024. CLINICAL HISTORY: Abdominal pain, acute, nonlocalized. Vomiting. FINDINGS: LOWER CHEST: No acute abnormality. LIVER: Hepatic steatosis. Unchanged 1.3 cm periportal lymph node on series 3 image 24. GALLBLADDER AND BILE DUCTS: Gallbladder is unremarkable. No biliary ductal dilatation. SPLEEN: No acute abnormality. PANCREAS: No acute abnormality. ADRENAL GLANDS: No acute abnormality. KIDNEYS, URETERS AND BLADDER: No stones in the kidneys or ureters. No hydronephrosis. No perinephric or periureteral stranding. Urinary bladder is unremarkable. GI AND BOWEL: Soft tissue mass in the descending duodenum measures 2.3 x 1.8 cm and is similar to 06/03/2024 given differences in technique. Normal appendix. There is no bowel obstruction. PERITONEUM AND RETROPERITONEUM: No ascites. No free air. VASCULATURE: Aorta is normal in caliber. LYMPH NODES: Unchanged 1.3 cm periportal lymph node on series 3 image 24. No other lymphadenopathy. REPRODUCTIVE ORGANS: No acute abnormality. BONES AND SOFT TISSUES: No acute osseous abnormality.  No focal soft tissue abnormality. IMPRESSION: 1. Soft tissue mass in the descending duodenum measuring 2.3 x 1.8 cm, similar to 06/03/2024; correlation with endoscopy is recommended. 2. Unchanged 1.3 cm periportal lymph node. 3. Hepatic steatosis. Electronically signed by: Norman Gatlin MD 07/01/2024 03:58 AM EDT RP Workstation: [id number]     Procedures   Medications Ordered in the ED  acetaminophen  (TYLENOL ) tablet 650 mg (has no administration in time range)  ondansetron  (ZOFRAN -ODT) disintegrating tablet 4 mg (4 mg Oral Given 06/30/24 1848)  oxyCODONE -acetaminophen  (PERCOCET/ROXICET) 5-325 MG per tablet 1 tablet (1 tablet Oral Given 06/30/24 1849)  ondansetron  (ZOFRAN ) injection 4 mg (4 mg Intravenous Given 07/01/24 0351)  sodium chloride  0.9 % bolus 1,000 mL (0 mLs Intravenous Stopped 07/01/24 0547)  iohexol  (OMNIPAQUE ) 350 MG/ML injection 75 mL (75 mLs Intravenous Contrast Given 07/01/24 0349)  potassium chloride SA (KLOR-CON M) CR tablet 40 mEq (40 mEq Oral Given 07/01/24 9587)                                    Medical Decision Making Amount and/or Complexity of Data Reviewed Radiology: ordered.  Risk Prescription drug management.    This patient presents to the ED for concern of abdominal pain, this involves an extensive number of treatment options, and is a complaint that carries with it a high risk of complications and morbidity.  The differential diagnosis includes gastroenteritis, colitis, small bowel obstruction, appendicitis, cholecystitis, pancreatitis, nephrolithiasis, UTI, pyelonephritis   Co morbidities that complicate the patient evaluation  GERD, HTN, IBS, diverticulosis, HLD, prediabetes   Additional history obtained:  Dr. Harrie PCP   Problem List / ED Course / Critical interventions / Medication management  Patient presented for abdominal pain and nausea/vomiting after increased dose of Wegovy  yesterday.  Physical exam with mild epigastric and LLQ  tenderness to palpation.  Rest of physical exam reassuring.  Patient afebrile with stable vitals. I Ordered, and personally interpreted labs.  UA not concerning for infection.  CBC without leukocytosis or anemia.  CMP with mild hypokalemia at 3.3.  Lipase within normal limits.  hCG very mildly elevated at 7 - patient is postmenopausal and has history of tubal ligation. I ordered imaging studies  including CT Abd/Pelvis with contrast: evaluate for structural/surgical etiology of patients' severe abdominal pain.  I independently visualized and interpreted imaging and I agree with the radiologist interpretation of no acute process. CT was showing soft tissue mass in duodenum which was visualized on Endoscopy last month - looked like normal duodenal bulb. Patient aware of this result.  Shared all results with patient.  Answered all questions.  Patient now tolerating PO and is feeling better.  Prescribed patient nausea medicine and a work note.  Patient agreeable to plan and will be following up with PCP to discuss symptoms from recent Wegovy  injection dose increases. I have reviewed the patients home medicines and have made adjustments as needed The patient has been appropriately medically screened and/or stabilized in the ED. I have low suspicion for any other emergent medical condition which would require further screening, evaluation or treatment in the ED or require inpatient management. At time of discharge the patient is hemodynamically stable and in no acute distress. I have discussed work-up results and diagnosis with patient and answered all questions. Patient is agreeable with discharge plan. We discussed strict return precautions for returning to the emergency department and they verbalized understanding.    Social Determinants of Health:  none       Final diagnoses:  Nausea and vomiting, unspecified vomiting type    ED Discharge Orders          Ordered    ondansetron  (ZOFRAN -ODT) 4 MG  disintegrating tablet  Every 8 hours PRN        07/01/24 0604               Hoy Nidia FALCON, PA-C 07/01/24 0606    Bari Charmaine FALCON, MD 07/01/24 [phone]

## 2024-07-05 ENCOUNTER — Ambulatory Visit: Payer: MEDICAID

## 2024-07-09 ENCOUNTER — Other Ambulatory Visit: Payer: Self-pay

## 2024-07-09 ENCOUNTER — Ambulatory Visit: Payer: MEDICAID

## 2024-07-09 VITALS — BP 116/84 | HR 74 | Temp 98.1°F | Ht 64.0 in | Wt 212.6 lb

## 2024-07-09 DIAGNOSIS — N950 Postmenopausal bleeding: Secondary | ICD-10-CM | POA: Diagnosis not present

## 2024-07-09 DIAGNOSIS — R112 Nausea with vomiting, unspecified: Secondary | ICD-10-CM

## 2024-07-09 DIAGNOSIS — Z7985 Long-term (current) use of injectable non-insulin antidiabetic drugs: Secondary | ICD-10-CM

## 2024-07-09 DIAGNOSIS — R7989 Other specified abnormal findings of blood chemistry: Secondary | ICD-10-CM | POA: Diagnosis not present

## 2024-07-09 DIAGNOSIS — F17210 Nicotine dependence, cigarettes, uncomplicated: Secondary | ICD-10-CM

## 2024-07-09 NOTE — Progress Notes (Signed)
 Established Patient Office Visit  Subjective   Patient ID: Michele Gillespie, female    DOB: 1976/09/12  Age: 48 y.o. MRN: [id number]  Chief Complaint  Patient presents with   Follow-up    ED follow up for abd pain /  patient states she is not feeling any better since being seen in the ED / needing note for work. Requesting patches for nausea that goes behind the ear.   Michele Gillespie is a 48 year old female with past medical history of GERD, MDD, hypertension, and diverticulosis.  She is here today for a emergency department follow-up.  Please see problem-based assessment plan below.   Review of Systems  Constitutional:  Negative for chills and fever.  Eyes:  Negative for blurred vision.  Respiratory:  Negative for shortness of breath and wheezing.   Cardiovascular:  Negative for chest pain, palpitations and leg swelling.  Gastrointestinal:  Positive for abdominal pain, nausea and vomiting. Negative for diarrhea.  Genitourinary:  Negative for dysuria, frequency and urgency.  Neurological:  Negative for dizziness, tingling, sensory change and headaches.      Objective:     BP 116/84 (BP Location: Left Arm, Patient Position: Sitting, Cuff Size: Large)   Pulse 74   Temp 98.1 F (36.7 C) (Oral)   Ht 5' 4 (1.626 m)   Wt 212 lb 9.6 oz (96.4 kg)   SpO2 98%   BMI 36.49 kg/m    Const: Awake, alert in NAD HENT: Normocephalic, atraumatic, mucus membranes moist Card: RRR, No MRG, No pitting edema on LE's bilaterally  Resp: LCTAB, no increased work of breathing Abd: Soft, NTND, Bsx4 Extremities: Warm, pink  No results found for any visits on 07/09/24.    The 10-year ASCVD risk score (Arnett DK, et al., 2019) is: 6.2%    Assessment & Plan:   Assessment & Plan Elevated serum hCG Patient was noted to have a serum hCG of 7 in the emergency department. The patient reports that there is no possible that she is pregnant she no longer has periods, however we will recheck today to ensure  that this is a false elevation.  The level is also low enough to reduce suspicion for hCG secreting carcinomas. Orders:   Beta HCG, Quant ([facility])  Postmenopausal bleeding When discussed the patient's abdominal pain and recent nausea vomiting she did note that occasionally she has lower abdominal/pelvic pain however she no longer has periods.  She stated that her last period was multiple years ago.  However she does note that she is still spotting which sometimes last for days.  As endometrial bleeding in a postmenopausal patient is abnormal, we will order a pelvic ultrasound to assess endometrial lining and thickness.  If abnormal she would benefit from referral to gynecology. Orders:   US  Pelvic Complete With Transvaginal; Future  Nausea and vomiting, unspecified vomiting type The patient was recently seen in the ED on 10/1 for nausea and vomiting.  The patient reports that she was recently given wrong dose of her Wegovy  from the pharmacy and was given 2.40 mg instead of the 0.5 which she has been taking.  After that she developed significant nausea and vomiting.  She denies any marijuana use, sick contacts, recent travel, or possibility of being pregnant.  Suspect that she is having residual symptoms of this increase Wegovy  dose.  She she did stop taking the medicine and has missed approximately 1 dose and has noted that her symptoms have mildly improved however she is not still unable  to hold down solid food.  She is requesting paperwork be filled out for short-term disability for her work as she says that she is so sick that she is unable to work right now.  Discussed with patient we will fill out today work to the honest, best of our abilities however it will be up to her work as to whether this is improved or not. Continue scopolamine  patches as needed.     Patient seen with Dr. Shawn.  Michele Novak, DO

## 2024-07-09 NOTE — Progress Notes (Signed)
 Internal Medicine Clinic Attending  I was physically present during the key portions of the resident provided service and participated in the medical decision making of patient's management care. I reviewed pertinent patient test results.  The assessment, diagnosis, and plan were formulated together and I agree with the documentation in the resident's note.  Shawn Sick, MD

## 2024-07-10 ENCOUNTER — Ambulatory Visit: Payer: Self-pay

## 2024-07-10 LAB — BETA HCG QUANT (REF LAB): hCG Quant: 6 m[IU]/mL

## 2024-07-10 NOTE — Assessment & Plan Note (Signed)
 The patient was recently seen in the ED on 10/1 for nausea and vomiting.  The patient reports that she was recently given wrong dose of her Wegovy  from the pharmacy and was given 2.40 mg instead of the 0.5 which she has been taking.  After that she developed significant nausea and vomiting.  She denies any marijuana use, sick contacts, recent travel, or possibility of being pregnant.  Suspect that she is having residual symptoms of this increase Wegovy  dose.  She she did stop taking the medicine and has missed approximately 1 dose and has noted that her symptoms have mildly improved however she is not still unable to hold down solid food.  She is requesting paperwork be filled out for short-term disability for her work as she says that she is so sick that she is unable to work right now.  Discussed with patient we will fill out today work to the honest, best of our abilities however it will be up to her work as to whether this is improved or not. Continue scopolamine  patches as needed.

## 2024-07-10 NOTE — Progress Notes (Signed)
 hCG 6 today, down from 7 in the ED earlier this month. No concern for pregnancy or hCG secreting tumor at this time.

## 2024-07-20 ENCOUNTER — Ambulatory Visit (HOSPITAL_COMMUNITY)
Admission: RE | Admit: 2024-07-20 | Discharge: 2024-07-20 | Disposition: A | Discharge status: Home / Self Care | Payer: MEDICAID | Source: Ambulatory Visit

## 2024-07-20 DIAGNOSIS — N950 Postmenopausal bleeding: Secondary | ICD-10-CM | POA: Insufficient documentation

## 2024-08-23 ENCOUNTER — Ambulatory Visit (INDEPENDENT_AMBULATORY_CARE_PROVIDER_SITE_OTHER): Payer: MEDICAID | Admitting: Pharmacist

## 2024-08-23 ENCOUNTER — Ambulatory Visit: Payer: MEDICAID

## 2024-08-23 VITALS — BP 123/85 | HR 74 | Temp 97.9°F | Ht 64.0 in | Wt 218.4 lb

## 2024-08-23 DIAGNOSIS — G8929 Other chronic pain: Secondary | ICD-10-CM

## 2024-08-23 DIAGNOSIS — M25552 Pain in left hip: Secondary | ICD-10-CM

## 2024-08-23 DIAGNOSIS — F17210 Nicotine dependence, cigarettes, uncomplicated: Secondary | ICD-10-CM

## 2024-08-23 DIAGNOSIS — D6859 Other primary thrombophilia: Secondary | ICD-10-CM | POA: Diagnosis not present

## 2024-08-23 DIAGNOSIS — R7989 Other specified abnormal findings of blood chemistry: Secondary | ICD-10-CM | POA: Diagnosis not present

## 2024-08-23 DIAGNOSIS — Z7901 Long term (current) use of anticoagulants: Secondary | ICD-10-CM

## 2024-08-23 LAB — POCT URINE PREGNANCY: Preg Test, Ur: NEGATIVE

## 2024-08-23 LAB — POCT INR: INR: 2.7 (ref 2.0–3.0)

## 2024-08-23 NOTE — Patient Instructions (Signed)
 Patient instructed to take medications as defined in the Anti-coagulation Track section of this encounter.  Patient instructed to take today's dose.  Patient instructed to take only one (1) of your 10 mg strength, white colored warfarin tablets on MONDAYS, WEDNESDAYS and FRIDAYS. ALL OTHER DAYS, Sundays, Tuesdays, Thursdays and Saturdays, take one-and-one-half (1 & 1/2) of your 10 mg strength white colored warfarin tablets.  Patient verbalized understanding of these instructions.

## 2024-08-23 NOTE — Patient Instructions (Addendum)
 You were seen for evaluation of left hip pain and elevated beta hCG levels.  Please follow the instructions as discussed in today's plan: -Please go to [facility] imaging or [facility] health radiology department to get x-ray of your left leg - Will place referral to orthopedics and physical therapy.  They will be contacting you soon. - Will place referral with OB/GYN for evaluation of elevated beta-hCG levels and spotting  Thank you  Sincerely, Rebecka Pion
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
## 2024-08-23 NOTE — Assessment & Plan Note (Signed)
 Patient complains about chronic left hip pain.  Reports it radiates from her left hip to the back of her thigh and sometimes to her knee.  Denies any swelling, fevers, chills.  Sometimes her lower left back also hurts but majority of the time is just her left hip that hurts and not the back.  Patient says the pain begins when she is at rest or immobile for long time or walks for very long time.  Voltaren  gel or patches have not worked for her.  Patient reports getting injections in the hip in the past that helped her for some time.  She is open to physical therapy now, however, reports that she has several jobs for which she works every day and can try to schedule sessions after work.  She also wanted to be seen by ortho.  History and PE findings likely due to osteoarthritis, ligament pain, bursitis.  Informed patient that order for her imaging of left hip had been placed in the past and that she can get imaging done soon for further evaluation of her left hip pain.  - Orthopedic surgery referral placed -Physical therapy referral placed
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
## 2024-08-24 ENCOUNTER — Other Ambulatory Visit: Payer: Self-pay | Admitting: Pharmacist

## 2024-08-24 ENCOUNTER — Ambulatory Visit: Payer: Self-pay

## 2024-08-24 DIAGNOSIS — Z7901 Long term (current) use of anticoagulants: Secondary | ICD-10-CM

## 2024-08-24 DIAGNOSIS — D6859 Other primary thrombophilia: Secondary | ICD-10-CM

## 2024-08-24 MED ORDER — WARFARIN SODIUM 10 MG PO TABS: ORAL_TABLET | ORAL | 2 refills | Status: AC

## 2024-08-24 NOTE — Telephone Encounter (Signed)
 Requesting refill authorization. Will send Rx refill authorization for her warfarin 10 mg strength tablets quantum sufficient to take 10 mg MWF; 15 mg (1 & 1/2 x 10 mg) on Sundays, Tuesdays, Thursdays and Saturdays, with 2 refills. Relatively compliant with RTC appointments.

## 2024-08-24 NOTE — Progress Notes (Signed)
 Evaluation and management procedures were performed by the Clinical Pharmacy Practitioner under my supervision and collaboration. I have reviewed the Practitioner's note and chart, and I agree with the management and plan as documented above.   Dickie La, MD

## 2024-08-25 ENCOUNTER — Telehealth: Payer: Self-pay

## 2024-08-25 NOTE — Telephone Encounter (Signed)
 Received a prior authorization request for Wegovy . The patient has medicaid, medicaid no longer covers any GLP-1/ weight loss medications.

## 2024-08-30 NOTE — Addendum Note (Signed)
 Addended by: KARNA FELLOWS on: 08/30/2024 10:02 AM   Modules accepted: Level of Service

## 2024-08-30 NOTE — Progress Notes (Signed)
 Internal Medicine Clinic Attending  I was physically present during the key portions of the resident provided service and participated in the medical decision making of patient's management care. I reviewed pertinent patient test results.  The assessment, diagnosis, and plan were formulated together and I agree with the documentation in the resident's note.  Dickie La, MD

## 2024-09-06 ENCOUNTER — Other Ambulatory Visit (HOSPITAL_COMMUNITY): Payer: Self-pay

## 2024-09-07 ENCOUNTER — Telehealth: Payer: Self-pay

## 2024-09-07 NOTE — Telephone Encounter (Addendum)
 Tobias Kapur (Key: [id number]) Wegovy  2.4MG /0.75ML auto-injectors Form [employer] [place] [employer] Electronic Request Form Created 22 minutes ago Sent to Plan 21 minutes ago Plan Response 20 minutes ago Submit Clinical Questions 16 minutes ago Determination Unfavorable 1 minute ago eAppeal Submitted eAppeal Determination Your prior authorization request has been denied. Complete E-Appeal Your request for prior authorization was denied, but an Electronic Appeal is available for your patient. Complete the questions in the Appeal section at the bottom of this page to pursue the appeal. For assistance, contact our support team at [phone].  Message from plan: Denied. This health benefit plan does not cover the following services, supplies, drugs or charges: Any treatment or regimen, medical or surgical, for the purpose of reducing or controlling the weight of the member, or for the treatment of obesity, except for surgical treatment of morbid obesity, or as specifically covered by this health benefit plan.

## 2024-09-07 NOTE — Telephone Encounter (Signed)
 Prior Authorization for patient (  Wegovy  2.4MG /0.75ML auto-injectors  ) came through on cover my meds was submitted with last office notes awaiting approval or denial.  KEY: [id number]

## 2024-09-13 ENCOUNTER — Other Ambulatory Visit: Payer: Self-pay | Admitting: Student

## 2024-09-13 DIAGNOSIS — I10 Essential (primary) hypertension: Secondary | ICD-10-CM

## 2024-09-13 NOTE — Telephone Encounter (Unsigned)
 Copied from CRM #[id number]. Topic: Clinical - Medication Refill >> Sep 13, 2024  3:44 PM Michele Gillespie wrote: Medication:  amLODipine  amLODipine  (NORVASC ) 5 MG tablet  Patient was told that the doctor that wrote the prescription is no longer with medicaid. Patient has been out for over a week.   Has the patient contacted their pharmacy? Yes (Agent: If no, request that the patient contact the pharmacy for the refill. If patient does not wish to contact the pharmacy document the reason why and proceed with request.) (Agent: If yes, when and what did the pharmacy advise?)  This is the patient's preferred pharmacy:  [id number] [place], [place] - [address] AT [facility] [address] [place] [address] Phone: [phone] Fax: [phone]   Is this the correct pharmacy for this prescription? Yes If no, delete pharmacy and type the correct one.   Has the prescription been filled recently? No  Is the patient out of the medication? Yes  Has the patient been seen for an appointment in the last year OR does the patient have an upcoming appointment? Yes  Can we respond through MyChart? Yes  Agent: Please be advised that Rx refills may take up to 3 business days. We ask that you follow-up with your pharmacy.

## 2024-09-14 MED ORDER — AMLODIPINE BESYLATE 5 MG PO TABS: 5.0000 mg | ORAL_TABLET | Freq: Every day | ORAL | 11 refills | Status: AC
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
## 2024-09-20 ENCOUNTER — Ambulatory Visit

## 2024-09-20 ENCOUNTER — Other Ambulatory Visit: Payer: Self-pay | Admitting: Student

## 2024-09-20 DIAGNOSIS — Z1231 Encounter for screening mammogram for malignant neoplasm of breast: Secondary | ICD-10-CM

## 2024-09-21 ENCOUNTER — Ambulatory Visit: Attending: Family Medicine

## 2024-09-21 ENCOUNTER — Ambulatory Visit
Admission: RE | Admit: 2024-09-21 | Discharge: 2024-09-21 | Disposition: A | Discharge status: Home / Self Care | Source: Ambulatory Visit

## 2024-09-21 DIAGNOSIS — Z1231 Encounter for screening mammogram for malignant neoplasm of breast: Secondary | ICD-10-CM

## 2024-10-19 ENCOUNTER — Ambulatory Visit: Admission: RE | Admit: 2024-10-19 | Discharge: 2024-10-19 | Disposition: A | Payer: MEDICAID | Source: Ambulatory Visit
